# Patient Record
Sex: Female | Born: 1935
Health system: Southern US, Community
[De-identification: ages and names within clinical notes are randomized; demographics above are authoritative.]

## PROBLEM LIST (undated history)

## (undated) DIAGNOSIS — D649 Anemia, unspecified: Secondary | ICD-10-CM

## (undated) DIAGNOSIS — K449 Diaphragmatic hernia without obstruction or gangrene: Secondary | ICD-10-CM

## (undated) DIAGNOSIS — K922 Gastrointestinal hemorrhage, unspecified: Secondary | ICD-10-CM

## (undated) DIAGNOSIS — E785 Hyperlipidemia, unspecified: Secondary | ICD-10-CM

## (undated) DIAGNOSIS — N393 Stress incontinence (female) (male): Secondary | ICD-10-CM

## (undated) DIAGNOSIS — Z8719 Personal history of other diseases of the digestive system: Secondary | ICD-10-CM

## (undated) DIAGNOSIS — M545 Low back pain: Secondary | ICD-10-CM

## (undated) DIAGNOSIS — I480 Paroxysmal atrial fibrillation: Secondary | ICD-10-CM

## (undated) DIAGNOSIS — M6281 Muscle weakness (generalized): Secondary | ICD-10-CM

## (undated) DIAGNOSIS — G43109 Migraine with aura, not intractable, without status migrainosus: Secondary | ICD-10-CM

## (undated) DIAGNOSIS — F419 Anxiety disorder, unspecified: Secondary | ICD-10-CM

## (undated) DIAGNOSIS — I739 Peripheral vascular disease, unspecified: Secondary | ICD-10-CM

## (undated) DIAGNOSIS — I10 Essential (primary) hypertension: Secondary | ICD-10-CM

## (undated) DIAGNOSIS — I252 Old myocardial infarction: Secondary | ICD-10-CM

## (undated) DIAGNOSIS — I2510 Atherosclerotic heart disease of native coronary artery without angina pectoris: Secondary | ICD-10-CM

## (undated) DIAGNOSIS — H5702 Anisocoria: Secondary | ICD-10-CM

## (undated) DIAGNOSIS — I779 Disorder of arteries and arterioles, unspecified: Secondary | ICD-10-CM

## (undated) DIAGNOSIS — I639 Cerebral infarction, unspecified: Secondary | ICD-10-CM

## (undated) DIAGNOSIS — K219 Gastro-esophageal reflux disease without esophagitis: Secondary | ICD-10-CM

## (undated) DIAGNOSIS — K259 Gastric ulcer, unspecified as acute or chronic, without hemorrhage or perforation: Secondary | ICD-10-CM

## (undated) DIAGNOSIS — K589 Irritable bowel syndrome without diarrhea: Secondary | ICD-10-CM

## (undated) DIAGNOSIS — M1990 Unspecified osteoarthritis, unspecified site: Secondary | ICD-10-CM

## (undated) HISTORY — DX: Anisocoria: H57.02

## (undated) HISTORY — DX: Essential (primary) hypertension: I10

## (undated) HISTORY — DX: Personal history of other diseases of the digestive system: Z87.19

## (undated) HISTORY — DX: Gastric ulcer, unspecified as acute or chronic, without hemorrhage or perforation: K25.9

## (undated) HISTORY — DX: Irritable bowel syndrome without diarrhea: K58.9

## (undated) HISTORY — DX: Low back pain: M54.5

## (undated) HISTORY — DX: Atherosclerotic heart disease of native coronary artery without angina pectoris: I25.10

## (undated) HISTORY — DX: Migraine with aura, not intractable, without status migrainosus: G43.109

## (undated) HISTORY — DX: Diaphragmatic hernia without obstruction or gangrene: K44.9

## (undated) HISTORY — DX: Muscle weakness (generalized): M62.81

## (undated) HISTORY — DX: Hyperlipidemia, unspecified: E78.5

## (undated) HISTORY — DX: Paroxysmal atrial fibrillation: I48.0

## (undated) HISTORY — DX: Gastro-esophageal reflux disease without esophagitis: K21.9

## (undated) HISTORY — DX: Old myocardial infarction: I25.2

## (undated) HISTORY — PX: TONSILLECTOMY: SUR1361

## (undated) HISTORY — DX: Unspecified osteoarthritis, unspecified site: M19.90

---

## 1971-04-30 HISTORY — PX: ABDOMINAL HYSTERECTOMY: SHX81

## 1995-04-30 HISTORY — PX: CORONARY ANGIOPLASTY: SHX604

## 1995-04-30 HISTORY — PX: CORONARY ARTERY BYPASS GRAFT: SHX141

## 1997-04-29 HISTORY — PX: CATARACT EXTRACTION W/ INTRAOCULAR LENS  IMPLANT, BILATERAL: SHX1307

## 1997-08-23 ENCOUNTER — Other Ambulatory Visit: Admission: RE | Admit: 1997-08-23 | Discharge: 1997-08-23 | Payer: Self-pay | Admitting: Oral Surgery

## 1998-02-23 ENCOUNTER — Other Ambulatory Visit: Admission: RE | Admit: 1998-02-23 | Discharge: 1998-02-23 | Payer: Self-pay | Admitting: Obstetrics and Gynecology

## 1999-02-26 ENCOUNTER — Other Ambulatory Visit: Admission: RE | Admit: 1999-02-26 | Discharge: 1999-02-26 | Payer: Self-pay | Admitting: Obstetrics and Gynecology

## 1999-11-16 ENCOUNTER — Encounter: Payer: Self-pay | Admitting: Internal Medicine

## 1999-11-16 ENCOUNTER — Ambulatory Visit (HOSPITAL_COMMUNITY): Admission: RE | Admit: 1999-11-16 | Discharge: 1999-11-16 | Payer: Self-pay | Admitting: Internal Medicine

## 1999-12-07 ENCOUNTER — Ambulatory Visit (HOSPITAL_COMMUNITY): Admission: RE | Admit: 1999-12-07 | Discharge: 1999-12-07 | Payer: Self-pay | Admitting: Internal Medicine

## 1999-12-07 ENCOUNTER — Encounter: Payer: Self-pay | Admitting: Internal Medicine

## 2000-02-04 ENCOUNTER — Other Ambulatory Visit: Admission: RE | Admit: 2000-02-04 | Discharge: 2000-02-04 | Payer: Self-pay | Admitting: Obstetrics and Gynecology

## 2000-06-20 ENCOUNTER — Encounter: Payer: Self-pay | Admitting: Cardiology

## 2000-06-20 ENCOUNTER — Ambulatory Visit (HOSPITAL_COMMUNITY): Admission: RE | Admit: 2000-06-20 | Discharge: 2000-06-20 | Payer: Self-pay | Admitting: Cardiology

## 2001-10-21 ENCOUNTER — Other Ambulatory Visit: Admission: RE | Admit: 2001-10-21 | Discharge: 2001-10-21 | Payer: Self-pay | Admitting: Obstetrics and Gynecology

## 2003-01-08 ENCOUNTER — Ambulatory Visit (HOSPITAL_COMMUNITY): Admission: RE | Admit: 2003-01-08 | Discharge: 2003-01-08 | Payer: Self-pay | Admitting: Orthopedic Surgery

## 2003-01-08 ENCOUNTER — Encounter: Payer: Self-pay | Admitting: Orthopedic Surgery

## 2003-01-26 ENCOUNTER — Encounter: Payer: Self-pay | Admitting: Internal Medicine

## 2003-01-26 ENCOUNTER — Ambulatory Visit (HOSPITAL_COMMUNITY): Admission: RE | Admit: 2003-01-26 | Discharge: 2003-01-26 | Payer: Self-pay | Admitting: Internal Medicine

## 2003-05-02 LAB — HM COLONOSCOPY

## 2004-03-07 ENCOUNTER — Ambulatory Visit: Payer: Self-pay | Admitting: Internal Medicine

## 2004-06-13 ENCOUNTER — Ambulatory Visit: Payer: Self-pay | Admitting: Internal Medicine

## 2004-06-20 ENCOUNTER — Ambulatory Visit: Payer: Self-pay | Admitting: Internal Medicine

## 2004-06-25 ENCOUNTER — Ambulatory Visit: Payer: Self-pay | Admitting: Internal Medicine

## 2004-06-28 ENCOUNTER — Ambulatory Visit: Payer: Self-pay | Admitting: Internal Medicine

## 2004-10-08 ENCOUNTER — Ambulatory Visit: Payer: Self-pay | Admitting: Internal Medicine

## 2004-10-15 ENCOUNTER — Ambulatory Visit: Payer: Self-pay | Admitting: Cardiology

## 2004-10-16 ENCOUNTER — Ambulatory Visit: Payer: Self-pay | Admitting: Cardiology

## 2004-10-29 ENCOUNTER — Ambulatory Visit: Payer: Self-pay | Admitting: Internal Medicine

## 2004-11-01 ENCOUNTER — Ambulatory Visit: Payer: Self-pay | Admitting: Internal Medicine

## 2004-11-21 ENCOUNTER — Encounter: Payer: Self-pay | Admitting: Internal Medicine

## 2004-11-21 ENCOUNTER — Ambulatory Visit: Payer: Self-pay | Admitting: Internal Medicine

## 2004-11-27 ENCOUNTER — Ambulatory Visit: Payer: Self-pay | Admitting: Cardiology

## 2004-11-29 ENCOUNTER — Ambulatory Visit: Payer: Self-pay | Admitting: Cardiology

## 2005-03-04 ENCOUNTER — Ambulatory Visit: Payer: Self-pay | Admitting: Internal Medicine

## 2005-07-26 ENCOUNTER — Ambulatory Visit: Payer: Self-pay | Admitting: Cardiology

## 2005-07-31 ENCOUNTER — Ambulatory Visit: Payer: Self-pay | Admitting: Cardiology

## 2005-09-18 ENCOUNTER — Ambulatory Visit: Payer: Self-pay | Admitting: Internal Medicine

## 2005-09-26 ENCOUNTER — Ambulatory Visit: Payer: Self-pay | Admitting: Internal Medicine

## 2006-01-20 ENCOUNTER — Ambulatory Visit: Payer: Self-pay | Admitting: Internal Medicine

## 2006-03-28 ENCOUNTER — Ambulatory Visit: Payer: Self-pay | Admitting: Internal Medicine

## 2006-07-04 ENCOUNTER — Ambulatory Visit: Payer: Self-pay | Admitting: Internal Medicine

## 2006-08-29 ENCOUNTER — Ambulatory Visit: Payer: Self-pay | Admitting: Internal Medicine

## 2006-08-29 LAB — CONVERTED CEMR LAB
ALT: 17 units/L (ref 0–40)
AST: 18 units/L (ref 0–37)
Alkaline Phosphatase: 68 units/L (ref 39–117)
Basophils Relative: 0.8 % (ref 0.0–1.0)
Bilirubin, Direct: 0.1 mg/dL (ref 0.0–0.3)
Calcium: 9.4 mg/dL (ref 8.4–10.5)
Chloride: 105 meq/L (ref 96–112)
Cholesterol: 191 mg/dL (ref 0–200)
Eosinophils Relative: 3.3 % (ref 0.0–5.0)
GFR calc Af Amer: 125 mL/min
GFR calc non Af Amer: 103 mL/min
HCT: 39.8 % (ref 36.0–46.0)
Hemoglobin: 13.7 g/dL (ref 12.0–15.0)
MCHC: 34.4 g/dL (ref 30.0–36.0)
Monocytes Relative: 9.1 % (ref 3.0–11.0)
Potassium: 4.1 meq/L (ref 3.5–5.1)
RBC: 4.43 M/uL (ref 3.87–5.11)
Sodium: 143 meq/L (ref 135–145)
TSH: 2.12 microintl units/mL (ref 0.35–5.50)
Total Bilirubin: 0.7 mg/dL (ref 0.3–1.2)
Total CHOL/HDL Ratio: 3.9
Triglycerides: 121 mg/dL (ref 0–149)
VLDL: 24 mg/dL (ref 0–40)

## 2006-09-05 ENCOUNTER — Ambulatory Visit: Payer: Self-pay | Admitting: Internal Medicine

## 2006-11-25 ENCOUNTER — Encounter: Payer: Self-pay | Admitting: Internal Medicine

## 2006-11-25 DIAGNOSIS — K219 Gastro-esophageal reflux disease without esophagitis: Secondary | ICD-10-CM

## 2006-11-25 DIAGNOSIS — E785 Hyperlipidemia, unspecified: Secondary | ICD-10-CM

## 2006-11-25 DIAGNOSIS — I480 Paroxysmal atrial fibrillation: Secondary | ICD-10-CM

## 2006-11-25 DIAGNOSIS — Z8719 Personal history of other diseases of the digestive system: Secondary | ICD-10-CM | POA: Insufficient documentation

## 2006-11-25 DIAGNOSIS — I2510 Atherosclerotic heart disease of native coronary artery without angina pectoris: Secondary | ICD-10-CM

## 2006-11-25 HISTORY — DX: Paroxysmal atrial fibrillation: I48.0

## 2006-11-25 HISTORY — DX: Gastro-esophageal reflux disease without esophagitis: K21.9

## 2007-02-05 ENCOUNTER — Ambulatory Visit: Payer: Self-pay | Admitting: Internal Medicine

## 2007-02-05 DIAGNOSIS — J209 Acute bronchitis, unspecified: Secondary | ICD-10-CM

## 2007-02-26 ENCOUNTER — Emergency Department (HOSPITAL_COMMUNITY): Admission: EM | Admit: 2007-02-26 | Discharge: 2007-02-26 | Payer: Self-pay | Admitting: Emergency Medicine

## 2007-03-02 ENCOUNTER — Telehealth: Payer: Self-pay | Admitting: Internal Medicine

## 2007-03-05 ENCOUNTER — Ambulatory Visit: Payer: Self-pay | Admitting: Internal Medicine

## 2007-03-10 ENCOUNTER — Ambulatory Visit: Payer: Self-pay | Admitting: Internal Medicine

## 2007-04-13 ENCOUNTER — Ambulatory Visit: Payer: Self-pay | Admitting: Internal Medicine

## 2007-04-13 DIAGNOSIS — G459 Transient cerebral ischemic attack, unspecified: Secondary | ICD-10-CM | POA: Insufficient documentation

## 2007-04-29 ENCOUNTER — Ambulatory Visit: Payer: Self-pay

## 2007-04-29 ENCOUNTER — Encounter: Payer: Self-pay | Admitting: Internal Medicine

## 2007-05-08 ENCOUNTER — Ambulatory Visit: Payer: Self-pay | Admitting: Internal Medicine

## 2007-05-08 LAB — CONVERTED CEMR LAB
ALT: 14 units/L (ref 0–35)
BUN: 10 mg/dL (ref 6–23)
Basophils Absolute: 0 10*3/uL (ref 0.0–0.1)
CO2: 32 meq/L (ref 19–32)
Calcium: 10.4 mg/dL (ref 8.4–10.5)
Chloride: 102 meq/L (ref 96–112)
Creatinine, Ser: 0.7 mg/dL (ref 0.4–1.2)
Eosinophils Absolute: 0.2 10*3/uL (ref 0.0–0.6)
Eosinophils Relative: 2.9 % (ref 0.0–5.0)
GFR calc non Af Amer: 86 mL/min
Glucose, Bld: 87 mg/dL (ref 70–99)
HDL: 50.6 mg/dL (ref >39.0)
LDL Cholesterol: 107 mg/dL — ABNORMAL HIGH (ref 0–99)
Monocytes Absolute: 0.7 10*3/uL (ref 0.2–0.7)
Neutro Abs: 5.6 10*3/uL (ref 1.4–7.7)
Neutrophils Relative %: 67.9 % (ref 43.0–77.0)
RBC: 4.4 M/uL (ref 3.87–5.11)
TSH: 2.58 microintl units/mL (ref 0.35–5.50)
Total CHOL/HDL Ratio: 3.7
Total Protein: 7 g/dL (ref 6.0–8.3)
Triglycerides: 157 mg/dL — ABNORMAL HIGH (ref 0–149)

## 2007-05-12 ENCOUNTER — Telehealth (INDEPENDENT_AMBULATORY_CARE_PROVIDER_SITE_OTHER): Payer: Self-pay | Admitting: *Deleted

## 2007-05-15 ENCOUNTER — Telehealth: Payer: Self-pay | Admitting: Internal Medicine

## 2007-05-22 ENCOUNTER — Telehealth: Payer: Self-pay | Admitting: Internal Medicine

## 2007-07-03 ENCOUNTER — Telehealth (INDEPENDENT_AMBULATORY_CARE_PROVIDER_SITE_OTHER): Payer: Self-pay | Admitting: *Deleted

## 2007-07-09 ENCOUNTER — Encounter: Payer: Self-pay | Admitting: Internal Medicine

## 2007-08-13 ENCOUNTER — Ambulatory Visit: Payer: Self-pay | Admitting: Internal Medicine

## 2007-08-13 DIAGNOSIS — M1990 Unspecified osteoarthritis, unspecified site: Secondary | ICD-10-CM

## 2007-08-13 DIAGNOSIS — G43109 Migraine with aura, not intractable, without status migrainosus: Secondary | ICD-10-CM

## 2007-08-13 DIAGNOSIS — G9340 Encephalopathy, unspecified: Secondary | ICD-10-CM | POA: Insufficient documentation

## 2007-08-13 HISTORY — DX: Unspecified osteoarthritis, unspecified site: M19.90

## 2007-08-13 HISTORY — DX: Migraine with aura, not intractable, without status migrainosus: G43.109

## 2007-11-12 ENCOUNTER — Ambulatory Visit: Payer: Self-pay | Admitting: Internal Medicine

## 2007-11-13 LAB — CONVERTED CEMR LAB
Cholesterol: 197 mg/dL (ref 0–200)
Total CHOL/HDL Ratio: 4.2
VLDL: 37 mg/dL (ref 0–40)

## 2007-11-18 ENCOUNTER — Telehealth: Payer: Self-pay | Admitting: Internal Medicine

## 2007-11-26 ENCOUNTER — Telehealth: Payer: Self-pay | Admitting: Internal Medicine

## 2007-12-15 ENCOUNTER — Encounter: Payer: Self-pay | Admitting: Internal Medicine

## 2008-01-29 ENCOUNTER — Telehealth: Payer: Self-pay | Admitting: Internal Medicine

## 2008-02-04 ENCOUNTER — Telehealth: Payer: Self-pay | Admitting: Internal Medicine

## 2008-02-11 ENCOUNTER — Ambulatory Visit: Payer: Self-pay | Admitting: Internal Medicine

## 2008-03-17 ENCOUNTER — Ambulatory Visit: Payer: Self-pay | Admitting: Internal Medicine

## 2008-07-21 ENCOUNTER — Ambulatory Visit: Payer: Self-pay | Admitting: Internal Medicine

## 2008-07-21 DIAGNOSIS — R531 Weakness: Secondary | ICD-10-CM

## 2008-07-22 LAB — CONVERTED CEMR LAB
Albumin: 3.6 g/dL (ref 3.5–5.2)
BUN: 12 mg/dL (ref 6–23)
Basophils Absolute: 0.1 10*3/uL (ref 0.0–0.1)
Basophils Relative: 1.2 % (ref 0.0–3.0)
CO2: 28 meq/L (ref 19–32)
Calcium: 9.4 mg/dL (ref 8.4–10.5)
Chloride: 103 meq/L (ref 96–112)
Cholesterol: 169 mg/dL (ref 0–200)
Creatinine, Ser: 0.5 mg/dL (ref 0.4–1.2)
Eosinophils Absolute: 0.2 10*3/uL (ref 0.0–0.7)
Eosinophils Relative: 4.1 % (ref 0.0–5.0)
GFR calc non Af Amer: 126.26 mL/min (ref >60)
Glucose, Bld: 97 mg/dL (ref 70–99)
HCT: 38.2 % (ref 36.0–46.0)
Lymphocytes Relative: 26.6 % (ref 12.0–46.0)
Lymphs Abs: 1.4 10*3/uL (ref 0.7–4.0)
MCV: 92 fL (ref 78.0–100.0)
Neutro Abs: 3 10*3/uL (ref 1.4–7.7)
RDW: 12.2 % (ref 11.5–14.6)
Total Protein: 6.6 g/dL (ref 6.0–8.3)

## 2008-09-28 ENCOUNTER — Encounter: Payer: Self-pay | Admitting: Internal Medicine

## 2008-09-29 ENCOUNTER — Encounter: Payer: Self-pay | Admitting: Internal Medicine

## 2008-10-25 ENCOUNTER — Encounter: Payer: Self-pay | Admitting: Cardiology

## 2008-11-04 ENCOUNTER — Ambulatory Visit: Payer: Self-pay | Admitting: Internal Medicine

## 2008-11-04 DIAGNOSIS — J069 Acute upper respiratory infection, unspecified: Secondary | ICD-10-CM | POA: Insufficient documentation

## 2008-12-07 DIAGNOSIS — K589 Irritable bowel syndrome without diarrhea: Secondary | ICD-10-CM

## 2008-12-07 HISTORY — DX: Irritable bowel syndrome, unspecified: K58.9

## 2008-12-08 ENCOUNTER — Ambulatory Visit: Payer: Self-pay | Admitting: Cardiology

## 2008-12-08 DIAGNOSIS — R002 Palpitations: Secondary | ICD-10-CM

## 2008-12-15 ENCOUNTER — Ambulatory Visit: Payer: Self-pay | Admitting: Cardiology

## 2008-12-21 ENCOUNTER — Encounter: Payer: Self-pay | Admitting: Internal Medicine

## 2009-01-24 ENCOUNTER — Telehealth: Payer: Self-pay | Admitting: Internal Medicine

## 2009-01-30 ENCOUNTER — Telehealth: Payer: Self-pay | Admitting: Cardiology

## 2009-02-16 ENCOUNTER — Ambulatory Visit: Payer: Self-pay | Admitting: Cardiology

## 2009-05-02 ENCOUNTER — Telehealth (INDEPENDENT_AMBULATORY_CARE_PROVIDER_SITE_OTHER): Payer: Self-pay | Admitting: *Deleted

## 2009-05-03 ENCOUNTER — Encounter: Payer: Self-pay | Admitting: Internal Medicine

## 2009-05-04 ENCOUNTER — Encounter: Payer: Self-pay | Admitting: Cardiovascular Disease

## 2009-05-04 ENCOUNTER — Ambulatory Visit: Payer: Self-pay

## 2009-05-04 ENCOUNTER — Telehealth: Payer: Self-pay | Admitting: Internal Medicine

## 2009-06-05 ENCOUNTER — Encounter: Payer: Self-pay | Admitting: Internal Medicine

## 2009-06-08 ENCOUNTER — Telehealth: Payer: Self-pay | Admitting: Internal Medicine

## 2009-07-18 ENCOUNTER — Ambulatory Visit: Payer: Self-pay | Admitting: Internal Medicine

## 2009-07-18 LAB — CONVERTED CEMR LAB: Total CHOL/HDL Ratio: 3

## 2009-07-25 ENCOUNTER — Telehealth: Payer: Self-pay | Admitting: Internal Medicine

## 2009-08-03 ENCOUNTER — Telehealth: Payer: Self-pay | Admitting: Internal Medicine

## 2009-09-12 ENCOUNTER — Telehealth: Payer: Self-pay | Admitting: Internal Medicine

## 2009-09-19 ENCOUNTER — Ambulatory Visit: Payer: Self-pay | Admitting: Internal Medicine

## 2009-09-19 DIAGNOSIS — R634 Abnormal weight loss: Secondary | ICD-10-CM

## 2009-09-19 LAB — CONVERTED CEMR LAB
ALT: 14 units/L (ref 0–35)
AST: 21 units/L (ref 0–37)
BUN: 16 mg/dL (ref 6–23)
Basophils Absolute: 0 10*3/uL (ref 0.0–0.1)
Basophils Relative: 0.6 % (ref 0.0–3.0)
Bilirubin, Direct: 0.1 mg/dL (ref 0.0–0.3)
Calcium: 9.3 mg/dL (ref 8.4–10.5)
Chloride: 103 meq/L (ref 96–112)
Eosinophils Relative: 3.3 % (ref 0.0–5.0)
GFR calc non Af Amer: 112.78 mL/min (ref >60)
HCT: 38.8 % (ref 36.0–46.0)
Lymphocytes Relative: 27.6 % (ref 12.0–46.0)
Lymphs Abs: 1.8 10*3/uL (ref 0.7–4.0)
MCHC: 34 g/dL (ref 30.0–36.0)
Monocytes Absolute: 0.7 10*3/uL (ref 0.1–1.0)
Neutrophils Relative %: 57.3 % (ref 43.0–77.0)
Potassium: 4.1 meq/L (ref 3.5–5.1)
RBC: 4.21 M/uL (ref 3.87–5.11)
Sodium: 139 meq/L (ref 135–145)
Total Protein: 6.6 g/dL (ref 6.0–8.3)
WBC: 6.5 10*3/uL (ref 4.5–10.5)

## 2009-09-20 ENCOUNTER — Encounter: Payer: Self-pay | Admitting: Internal Medicine

## 2009-09-20 ENCOUNTER — Telehealth: Payer: Self-pay | Admitting: Internal Medicine

## 2009-09-21 ENCOUNTER — Encounter: Admission: RE | Admit: 2009-09-21 | Discharge: 2009-09-21 | Payer: Self-pay | Admitting: Internal Medicine

## 2009-09-26 ENCOUNTER — Telehealth: Payer: Self-pay | Admitting: Internal Medicine

## 2009-09-27 ENCOUNTER — Ambulatory Visit: Payer: Self-pay | Admitting: Internal Medicine

## 2009-09-27 ENCOUNTER — Ambulatory Visit: Payer: Self-pay | Admitting: Family Medicine

## 2009-09-27 DIAGNOSIS — M6281 Muscle weakness (generalized): Secondary | ICD-10-CM | POA: Insufficient documentation

## 2009-09-27 DIAGNOSIS — M545 Low back pain, unspecified: Secondary | ICD-10-CM

## 2009-09-27 HISTORY — DX: Low back pain, unspecified: M54.50

## 2009-09-27 HISTORY — DX: Muscle weakness (generalized): M62.81

## 2009-09-27 LAB — CONVERTED CEMR LAB: OCCULT 3: NEGATIVE

## 2009-09-28 ENCOUNTER — Inpatient Hospital Stay (HOSPITAL_COMMUNITY): Admission: EM | Admit: 2009-09-28 | Discharge: 2009-09-29 | Payer: Self-pay | Admitting: Emergency Medicine

## 2009-09-28 ENCOUNTER — Telehealth: Payer: Self-pay | Admitting: Cardiology

## 2009-09-29 ENCOUNTER — Ambulatory Visit: Payer: Self-pay | Admitting: Cardiovascular Disease

## 2009-10-02 ENCOUNTER — Telehealth: Payer: Self-pay | Admitting: Internal Medicine

## 2009-10-09 ENCOUNTER — Telehealth: Payer: Self-pay | Admitting: Internal Medicine

## 2009-10-09 ENCOUNTER — Inpatient Hospital Stay (HOSPITAL_COMMUNITY): Admission: EM | Admit: 2009-10-09 | Discharge: 2009-10-11 | Payer: Self-pay | Admitting: Emergency Medicine

## 2009-10-09 ENCOUNTER — Ambulatory Visit: Payer: Self-pay | Admitting: Internal Medicine

## 2009-10-10 ENCOUNTER — Encounter: Payer: Self-pay | Admitting: Internal Medicine

## 2009-10-10 ENCOUNTER — Encounter (INDEPENDENT_AMBULATORY_CARE_PROVIDER_SITE_OTHER): Payer: Self-pay | Admitting: Internal Medicine

## 2009-10-18 ENCOUNTER — Ambulatory Visit: Payer: Self-pay | Admitting: Internal Medicine

## 2009-10-20 ENCOUNTER — Telehealth: Payer: Self-pay | Admitting: Internal Medicine

## 2009-10-22 ENCOUNTER — Encounter: Payer: Self-pay | Admitting: Internal Medicine

## 2009-10-23 ENCOUNTER — Telehealth: Payer: Self-pay | Admitting: Internal Medicine

## 2009-10-23 ENCOUNTER — Ambulatory Visit: Payer: Self-pay | Admitting: Internal Medicine

## 2009-10-23 DIAGNOSIS — K250 Acute gastric ulcer with hemorrhage: Secondary | ICD-10-CM | POA: Insufficient documentation

## 2009-10-23 DIAGNOSIS — D62 Acute posthemorrhagic anemia: Secondary | ICD-10-CM | POA: Insufficient documentation

## 2009-10-26 ENCOUNTER — Encounter: Payer: Self-pay | Admitting: Internal Medicine

## 2009-10-26 LAB — CONVERTED CEMR LAB
Eosinophils Relative: 4.9 % (ref 0.0–5.0)
Ferritin: 19.8 ng/mL (ref 10.0–291.0)
Hemoglobin: 10.9 g/dL — ABNORMAL LOW (ref 12.0–15.0)
Lymphocytes Relative: 25.2 % (ref 12.0–46.0)
Lymphs Abs: 1.4 10*3/uL (ref 0.7–4.0)
MCHC: 34.7 g/dL (ref 30.0–36.0)
Monocytes Absolute: 0.6 10*3/uL (ref 0.1–1.0)
Neutro Abs: 3.3 10*3/uL (ref 1.4–7.7)
RDW: 14.4 % (ref 11.5–14.6)
WBC: 5.6 10*3/uL (ref 4.5–10.5)

## 2009-10-27 ENCOUNTER — Telehealth: Payer: Self-pay | Admitting: Internal Medicine

## 2009-11-06 ENCOUNTER — Telehealth: Payer: Self-pay | Admitting: Internal Medicine

## 2009-11-09 ENCOUNTER — Encounter: Payer: Self-pay | Admitting: Internal Medicine

## 2009-11-13 ENCOUNTER — Telehealth: Payer: Self-pay | Admitting: Internal Medicine

## 2009-11-16 ENCOUNTER — Telehealth: Payer: Self-pay | Admitting: Internal Medicine

## 2009-11-22 ENCOUNTER — Ambulatory Visit: Payer: Self-pay | Admitting: Internal Medicine

## 2009-11-22 LAB — CONVERTED CEMR LAB
Eosinophils Absolute: 0.3 10*3/uL (ref 0.0–0.7)
Eosinophils Relative: 4.8 % (ref 0.0–5.0)
Lymphocytes Relative: 22 % (ref 12.0–46.0)
Lymphs Abs: 1.3 10*3/uL (ref 0.7–4.0)
MCHC: 34.5 g/dL (ref 30.0–36.0)
Monocytes Absolute: 0.6 10*3/uL (ref 0.1–1.0)
Monocytes Relative: 9.9 % (ref 3.0–12.0)
Neutro Abs: 3.5 10*3/uL (ref 1.4–7.7)
Neutrophils Relative %: 62.5 % (ref 43.0–77.0)

## 2009-11-26 ENCOUNTER — Emergency Department (HOSPITAL_COMMUNITY): Admission: EM | Admit: 2009-11-26 | Discharge: 2009-11-26 | Payer: Self-pay | Admitting: Family Medicine

## 2009-11-27 ENCOUNTER — Telehealth: Payer: Self-pay | Admitting: Internal Medicine

## 2009-11-29 ENCOUNTER — Telehealth: Payer: Self-pay | Admitting: Internal Medicine

## 2009-12-01 ENCOUNTER — Telehealth: Payer: Self-pay | Admitting: Internal Medicine

## 2009-12-04 ENCOUNTER — Telehealth: Payer: Self-pay | Admitting: Cardiology

## 2009-12-13 ENCOUNTER — Encounter: Payer: Self-pay | Admitting: Internal Medicine

## 2010-01-04 ENCOUNTER — Ambulatory Visit: Payer: Self-pay | Admitting: Internal Medicine

## 2010-01-04 DIAGNOSIS — Z8679 Personal history of other diseases of the circulatory system: Secondary | ICD-10-CM | POA: Insufficient documentation

## 2010-01-24 ENCOUNTER — Ambulatory Visit: Payer: Self-pay | Admitting: Internal Medicine

## 2010-01-24 LAB — CONVERTED CEMR LAB
Basophils Relative: 0.5 % (ref 0.0–3.0)
Cholesterol: 189 mg/dL (ref 0–200)
Eosinophils Absolute: 0.2 10*3/uL (ref 0.0–0.7)
MCHC: 33.6 g/dL (ref 30.0–36.0)
MCV: 89.3 fL (ref 78.0–100.0)
Platelets: 176 10*3/uL (ref 150.0–400.0)
RBC: 4.12 M/uL (ref 3.87–5.11)
RDW: 13.9 % (ref 11.5–14.6)

## 2010-01-26 ENCOUNTER — Telehealth: Payer: Self-pay

## 2010-02-26 ENCOUNTER — Telehealth: Payer: Self-pay | Admitting: Internal Medicine

## 2010-03-14 ENCOUNTER — Telehealth: Payer: Self-pay | Admitting: Internal Medicine

## 2010-05-29 NOTE — Progress Notes (Signed)
Summary: rx  Phone Note Refill Request   Refills Requested: Medication #1:  LORAZEPAM 0.5 MG  TABS 1 two times a day as needed   Last Refilled: 03/20/2009 [facility] ph-----[phone]      fax----[phone]  Initial call taken by: Warnell Forester,  May 04, 2009 10:19 AM  Follow-up for Phone Call        Rx faxed to pharmacy Follow-up by: Raechel Ache, RN,  May 04, 2009 10:24 AM    Prescriptions: LORAZEPAM 0.5 MG  TABS (LORAZEPAM) 1 two times a day as needed  #100 x 1   Entered by:   Raechel Ache, RN   Authorized by:   Gordy Savers  MD   Signed by:   Raechel Ache, RN on 05/04/2009   Method used:   Printed then faxed to ...       [facility] (retail)       [address]       [place], [place]  [address]       Ph: [phone] or [phone]       Fax: [phone]   RxID:   [id number]

## 2010-05-29 NOTE — Progress Notes (Signed)
  Phone Note Call from Patient   Caller: Patient Call For: Gordy Savers  MD Summary of Call: In doughnut hole, and needs samples of Wellchol if possible??? [phone] Initial call taken by: [facility] CMA AAMA,  March 14, 2010 11:03 AM  Follow-up for Phone Call        ok Follow-up by: Gordy Savers  MD,  March 14, 2010 12:26 PM  Additional Follow-up for Phone Call Additional follow up Details #1::        Pt. notified. Additional Follow-up by: Lynann Beaver CMA AAMA,  March 14, 2010 1:16 PM

## 2010-05-29 NOTE — Progress Notes (Signed)
Summary: REQ FOR RETURN CALL  Phone Note Call from Patient   Caller: Patient   [phone] Summary of Call: Pt called in to adv that she needs a refill on med: Prednisone... Pt adv Dr Lanny Hurst wrote her a Rx for Prednisone to help with arthritis but this was  before pt had her stroke... Pt adv that she is still having problems and wants to know if Dr Kirtland Bouchard will prescribe her the med because she is still having some problems and the prednisone seemed to help before?Marland Kitchen... Pt was offered OV but Pt adv that she is not able to come in this week.... Pt would like a return call to discuss this medication if possible... pt can be reached at [phone].  Initial call taken by: Debbra Riding,  October 02, 2009 11:39 AM    New/Updated Medications: PREDNISONE 10 MG TABS (PREDNISONE) one daily Prescriptions: PREDNISONE 10 MG TABS (PREDNISONE) one daily  #30 x 0   Entered and Authorized by:   Gordy Savers  MD   Signed by:   Gordy Savers  MD on 10/02/2009   Method used:   Electronically to        [facility] (retail)       [address]       [place], [place]  [id number]       Ph: [phone] or [phone]       Fax: [phone]   RxID:   [id number]  prednisone 5 mg  #30 [facility]

## 2010-05-29 NOTE — Progress Notes (Signed)
Summary: lab results  Phone Note Call from Patient Call back at Home Phone [phone]   Caller: Patient Call For: Marissa Savers  MD Summary of Call: Please call pt with lab results. Initial call taken by: Lynann Beaver CMA,  January 26, 2010 3:19 PM  Follow-up for Phone Call        all normal ; cholesterol 189  Follow-up by: Marissa Savers  MD,  January 26, 2010 4:07 PM  Additional Follow-up for Phone Call Additional follow up Details #1::        called and discussed labs KIK Additional Follow-up by: Duard Brady LPN,  January 26, 2010 4:59 PM

## 2010-05-29 NOTE — Progress Notes (Signed)
Summary: take ASA?  Phone Note Call from Patient   Caller: Patient Call For: Marissa Savers  MD Summary of Call: Pt sprained wrist yesterday and was seen at [facility].  Was told to take 2 ASA daily, and pt wants Dr. Charm Rings permission to do this? [phone] Initial call taken by: Lynann Beaver CMA,  November 27, 2009 11:15 AM  Follow-up for Phone Call        ok Follow-up by: Marissa Savers  MD,  November 27, 2009 12:37 PM  Additional Follow-up for Phone Call Additional follow up Details #1::        Pt. advised. Additional Follow-up by: Lynann Beaver CMA,  November 27, 2009 12:55 PM

## 2010-05-29 NOTE — Assessment & Plan Note (Signed)
Summary: consult re: iron lvls/pt coming fasting/cjr   Vital Signs:  Patient profile:   77 year old female Weight:      150 pounds Temp:     98.0 degrees F oral BP sitting:   110 / 60  (right arm) Cuff size:   regular  Vitals Entered By: Duard Brady LPN (November 22, 2009 8:21 AM) CC: f/u on iron counts Is Patient Diabetic? No   Primary Care Provider:  Gordy Savers, MD  CC:  f/u on iron counts.  History of Present Illness: 77 year old patient who is in today for follow-up.  She  was hospitalized in early June for a right thalamic stroke.  She has a very mild left residual weakness.  She was rehospitalized later in the month for GI bleeding related to a antral gastric ulcer.  She is doing quite well and has been seen by GI and follow-up.  She has multiple questions about these two hospital admissions.  In hospital records were reviewed at length.  She has a history of IBS, osteoarthritis, and coronary artery disease.  She has treated hypertension, and dyslipidemia.she remains on iron b.i.d.  Allergies: 1)  ! Penicillin G Potassium (Penicillin G Potassium) 2)  ! * Statins 3)  ! Oxycodone Hcl (Oxycodone Hcl) 4)  ! Advil 5)  ! Prednisone 6)  Hydrocodone  Past History:  Past Medical History: Current Problems:  TRANSIENT ISCHEMIC ATTACK (ICD-435.9) HYPERLIPIDEMIA (ICD-272.4) HYPERTENSION (ICD-401.9) CORONARY ARTERY DISEASE (ICD-414.00) URI (ICD-465.9) FATIGUE (ICD-780.79) MIGRAINE WITH AURA (ICD-346.00) OSTEOARTHRITIS (ICD-715.90) ENCOUNTER FOR REMOVAL OF SUTURES (ICD-V58.32) BRONCHITIS, VIRAL (ICD-466.0) DIVERTICULITIS, HX OF (ICD-V12.79) GERD (ICD-530.81) IBS (ICD-564.1) gastric ulcer  June 2011 hiatal hernia status post right thalamic stroke June 2011 cerebrovascular disease with extensive small vessel disease changes  Review of Systems  The patient denies anorexia, fever, weight loss, weight gain, vision loss, decreased hearing, hoarseness, chest pain,  syncope, dyspnea on exertion, peripheral edema, prolonged cough, headaches, hemoptysis, abdominal pain, melena, hematochezia, severe indigestion/heartburn, hematuria, incontinence, genital sores, muscle weakness, suspicious skin lesions, transient blindness, difficulty walking, depression, unusual weight change, abnormal bleeding, enlarged lymph nodes, angioedema, and breast masses.    Physical Exam  General:  Well-developed,well-nourished,in no acute distress; alert,appropriate and cooperative throughout examination   blood pressure 110/64 Head:  Normocephalic and atraumatic without obvious abnormalities. No apparent alopecia or balding. Eyes:  No corneal or conjunctival inflammation noted. EOMI. Perrla. Funduscopic exam benign, without hemorrhages, exudates or papilledema. Vision grossly normal. Mouth:  Oral mucosa and oropharynx without lesions or exudates.  Teeth in good repair. Neck:  No deformities, masses, or tenderness noted. Lungs:  Normal respiratory effort, chest expands symmetrically. Lungs are clear to auscultation, no crackles or wheezes. Heart:  Normal rate and regular rhythm. S1 and S2 normal without gallop, murmur, click, rub or other extra sounds. Abdomen:  Bowel sounds positive,abdomen soft and non-tender without masses, organomegaly or hernias noted. Msk:  No deformity or scoliosis noted of thoracic or lumbar spine.   Pulses:  R and L carotid,radial,femoral,dorsalis pedis and posterior tibial pulses are full and equal bilaterally Neurologic:  No cranial nerve deficits noted. Station and gait are normal. Plantar reflexes are down-going bilaterally. DTRs are symmetrical throughout. Sensory, motor and coordinative functions appear intact.   Impression & Recommendations:  Problem # 1:  ACUTE POSTHEMORRHAGIC ANEMIA (ICD-285.1)  Her updated medication list for this problem includes:    Iron Supplement 325 (65 Fe) Mg Tabs (Ferrous sulfate) .Marland Kitchen... 1 by mouth two times a  day  Her updated medication list for this problem includes:    Iron Supplement 325 (65 Fe) Mg Tabs (Ferrous sulfate) .Marland Kitchen... 1 by mouth two times a day  Problem # 2:  ACUT GASTR ULCER W/HEMORR W/O MENTION OBST (ICD-531.00)  The following medications were removed from the medication list:    Nexium 40 Mg Cpdr (Esomeprazole magnesium) .Marland Kitchen... Take one by mouth once daily Her updated medication list for this problem includes:    Omeprazole 20 Mg Cpdr (Omeprazole) ..... Qd    The following medications were removed from the medication list:    Nexium 40 Mg Cpdr (Esomeprazole magnesium) .Marland Kitchen... Take one by mouth once daily Her updated medication list for this problem includes:    Omeprazole 20 Mg Cpdr (Omeprazole) ..... Qd  Problem # 3:  HYPERTENSION (ICD-401.9)  Her updated medication list for this problem includes:    Toprol Xl 50 Mg Tb24 (Metoprolol succinate) ..... Once daily  Her updated medication list for this problem includes:    Toprol Xl 50 Mg Tb24 (Metoprolol succinate) ..... Once daily  Problem # 4:  CORONARY ARTERY DISEASE (ICD-414.00)  Her updated medication list for this problem includes:    Toprol Xl 50 Mg Tb24 (Metoprolol succinate) ..... Once daily    Aspirin Buffered 325 Mg Tabs (Aspirin buff(mgcarb-alaminoac)) ..... One daily    Plavix 75 Mg Tabs (Clopidogrel bisulfate) .Marland Kitchen... Take one by mouth once daily  Her updated medication list for this problem includes:    Toprol Xl 50 Mg Tb24 (Metoprolol succinate) ..... Once daily    Aspirin Buffered 325 Mg Tabs (Aspirin buff(mgcarb-alaminoac)) ..... One daily    Plavix 75 Mg Tabs (Clopidogrel bisulfate) .Marland Kitchen... Take one by mouth once daily  Complete Medication List: 1)  Toprol Xl 50 Mg Tb24 (Metoprolol succinate) .... Once daily 2)  Welchol 625 Mg Tabs (Colesevelam hcl) .... 6 once daily 3)  Lorazepam 0.5 Mg Tabs (Lorazepam) .Marland Kitchen.. 1 two times a day as needed 4)  Niacin Cr 500 Mg Cpcr (Niacin) .... One daily 5)  Aspirin  Buffered 325 Mg Tabs (Aspirin buff(mgcarb-alaminoac)) .... One daily 6)  Flax Seed Oil 1000 Mg Caps (Flaxseed (linseed)) .... Take 1 capsule by mouth once a day 7)  Fish Oil 1000 Mg Caps (Omega-3 fatty acids) .... Take 1 capsule by mouth once a day 8)  Centrum Silver Tabs (Multiple vitamins-minerals) .... Take 1 tablet by mouth once a day 9)  Calcium Carbonate-vitamin D 600-400 Mg-unit Tabs (Calcium carbonate-vitamin d) .... Take 1 tablet by mouth once a day 10)  Vitamin C 500 Mg Tabs (Ascorbic acid) .... Take 1 tablet by mouth once a day 11)  Plavix 75 Mg Tabs (Clopidogrel bisulfate) .... Take one by mouth once daily 12)  Lipo-gel 100-30 Mg-unit Caps (Alpha lipoic acid-vitamin e) .... Take one by mouth once daily 13)  Tylenol Extra Strength 500 Mg Tabs (Acetaminophen) .Marland Kitchen.. 1-2 by mouth as needed pain 14)  Iron Supplement 325 (65 Fe) Mg Tabs (Ferrous sulfate) .Marland Kitchen.. 1 by mouth two times a day 15)  Omeprazole 20 Mg Cpdr (Omeprazole) .... Qd  Other Orders: Venipuncture (21308) TLB-CBC Platelet - w/Differential (85025-CBCD)  Patient Instructions: 1)  Please schedule a follow-up appointment in 2 months. 2)  Limit your Sodium (Salt) to less than 2 grams a day(slightly less than 1/2 a teaspoon) to prevent fluid retention, swelling, or worsening of symptoms. 3)  It is important that you exercise regularly at least 20 minutes 5 times a week. If you develop chest pain,  have severe difficulty breathing, or feel very tired , stop exercising immediately and seek medical attention.

## 2010-05-29 NOTE — Progress Notes (Signed)
Summary: needs discharge order for homehealth  Phone Note From Other Clinic   Caller: ? Zena Amos with homehealth Summary of Call: speech pathologist , went to see Marissa Bowen, pt refusing services - per pt no problems with swallowing,speech /language or cognition. Would liek order to discharge service.  can call [phone] Initial call taken by: Duard Brady LPN,  October 23, 2009 12:04 PM  Follow-up for Phone Call        OK to d/c Follow-up by: Gordy Savers  MD,  October 23, 2009 5:39 PM  Additional Follow-up for Phone Call Additional follow up Details #1::        attempt to call - ans mach - left msg to [facility] speech [facility]. KIK

## 2010-05-29 NOTE — Progress Notes (Signed)
Summary: RX refill Welchol   Phone Note Refill Request Message from:  Pharmacy on November 06, 2009 4:44 PM  Refills Requested: Medication #1:  WELCHOL 625 MG  TABS 6 once daily   Dosage confirmed as above?Dosage Confirmed Initial call taken by: Kathrynn Speed CMA,  November 06, 2009 4:44 PM    Prescriptions: WELCHOL 625 MG  TABS (COLESEVELAM HCL) 6 once daily  #360 x 4   Entered by:   Kathrynn Speed CMA   Authorized by:   Gordy Savers  MD   Signed by:   Kathrynn Speed CMA on 11/06/2009   Method used:   Electronically to        [facility] (retail)       [address]       [place], [place]  [address]       Ph: [phone] or [phone]       Fax: [phone]   RxID:   [id number]

## 2010-05-29 NOTE — Miscellaneous (Signed)
Summary: Physician's Orders/[facility]  Physician's Orders/[facility]   Imported By: Maryln Gottron 10/26/2009 09:09:18  _____________________________________________________________________  External Attachment:    Type:   Image     Comment:   External Document

## 2010-05-29 NOTE — Progress Notes (Signed)
Summary: Left arm and leg weakness  Phone Note Call from Patient Call back at Home Phone [phone]   Caller: Patient Summary of Call: Pt request call  would like call Initial call taken by: Judie Grieve,  September 28, 2009 8:33 AM  Follow-up for Phone Call        I spoke with the pt about symptoms she was having with her left arm and leg.  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  The pt noticed that she developed weakness in her legs and became unsteady.  The left leg was more weak than the right. The pt has recently been evaluated by Dr Lanny Hurst and was started on a Prednisone pack.  The pt thought maybe she was having a reaction to the Prednisone.  The pt contacted her PCP and did have an appt with Dr Clent Ridges on 09/27/09.  Dr Clent Ridges felt her symptoms were related to back issues.  An MRI is pending.  Last night the pt developed weakness in her left arm and is dragging her left foot.  The pt denies slurred speech, facial droop, HA or dizziness at this time.  The pt would like me to contact her PCP about these symptoms.  I did contact Kim with Dr Amador Cunas and we  felt it would be appropriate for the pt to go into the ER for evaluation.  The pt plans on going to [facility] hospital for further evauation of her symptoms.    Follow-up by: Julieta Gutting, RN, BSN,  September 28, 2009 9:09 AM

## 2010-05-29 NOTE — Progress Notes (Signed)
Summary: lab  Phone Note Call from Patient Call back at Home Phone [phone]   Reason for Call: Lab or Test Results Initial call taken by: Rudy Jew, RN,  Sep 20, 2009 3:18 PM  Follow-up for Phone Call        please notify that all lab is normal Follow-up by: Gordy Savers  MD,  Sep 21, 2009 8:30 AM  Additional Follow-up for Phone Call Additional follow up Details #1::        spoke with pt - labs all WNL - pt going for sono today. KIK Additional Follow-up by: Duard Brady LPN,  Sep 21, 2009 9:33 AM

## 2010-05-29 NOTE — Letter (Signed)
[facility]: [facility]   Imported By: Maryln Gottron 06/09/2009 15:40:31  _____________________________________________________________________  External Attachment:    Type:   Image     Comment:   External Document

## 2010-05-29 NOTE — Assessment & Plan Note (Signed)
Summary: 2 month f/u//alp  a we is a the on a from a local a leave is no and is of no the was the a for the the final in the office for a  Vital Signs:  Patient profile:   77 year old female Weight:      151 pounds Temp:     98.1 degrees F oral BP sitting:   120 / 70  (right arm) Cuff size:   regular  Vitals Entered By: Duard Brady LPN (January 24, 2010 8:26 AM) CC: 2 mos rov - doing ok - alittle tired Is Patient Diabetic? No   Primary Care Provider:  Gordy Savers, MD  CC:  2 mos rov - doing ok - alittle tired.  History of Present Illness: 77 year old patient who is seen today for follow-up.  She has a history of gastric oldster disease and cerebrovascular disease and has been on Plavix since June of this year.  She is now in the donut hole and having difficulty with her medications.  She denies any focal neurological complaints.  She was seen here 20 days ago with some palpitations that have not recurred.  She has treated dyslipidemia, and coronary artery disease.  She has hypertension, which has been well controlled  Allergies: 1)  ! Penicillin G Potassium (Penicillin G Potassium) 2)  ! * Statins 3)  ! Oxycodone Hcl (Oxycodone Hcl) 4)  ! Advil 5)  ! Prednisone 6)  Hydrocodone  Past History:  Past Medical History: Reviewed history from 11/22/2009 and no changes required. Current Problems:  TRANSIENT ISCHEMIC ATTACK (ICD-435.9) HYPERLIPIDEMIA (ICD-272.4) HYPERTENSION (ICD-401.9) CORONARY ARTERY DISEASE (ICD-414.00) URI (ICD-465.9) FATIGUE (ICD-780.79) MIGRAINE WITH AURA (ICD-346.00) OSTEOARTHRITIS (ICD-715.90) ENCOUNTER FOR REMOVAL OF SUTURES (ICD-V58.32) BRONCHITIS, VIRAL (ICD-466.0) DIVERTICULITIS, HX OF (ICD-V12.79) GERD (ICD-530.81) IBS (ICD-564.1) gastric ulcer  June 2011 hiatal hernia status post right thalamic stroke June 2011 cerebrovascular disease with extensive small vessel disease changes  Review of Systems  The patient denies  anorexia, fever, weight loss, weight gain, vision loss, decreased hearing, hoarseness, chest pain, syncope, dyspnea on exertion, peripheral edema, prolonged cough, headaches, hemoptysis, abdominal pain, melena, hematochezia, severe indigestion/heartburn, hematuria, incontinence, genital sores, muscle weakness, suspicious skin lesions, transient blindness, difficulty walking, depression, unusual weight change, abnormal bleeding, enlarged lymph nodes, angioedema, and breast masses.    Physical Exam  General:  Well-developed,well-nourished,in no acute distress; alert,appropriate and cooperative throughout examination Head:  Normocephalic and atraumatic without obvious abnormalities. No apparent alopecia or balding. Mouth:  Oral mucosa and oropharynx without lesions or exudates.  Teeth in good repair. Neck:  No deformities, masses, or tenderness noted. Lungs:  Normal respiratory effort, chest expands symmetrically. Lungs are clear to auscultation, no crackles or wheezes. Heart:  Normal rate and regular rhythm. S1 and S2 normal without gallop, murmur, click, rub or other extra sounds. Abdomen:  Bowel sounds positive,abdomen soft and non-tender without masses, organomegaly or hernias noted.   Impression & Recommendations:  Problem # 1:  ACUT GASTR ULCER W/HEMORR W/O MENTION OBST (ICD-531.00)  The following medications were removed from the medication list:    Omeprazole 20 Mg Cpdr (Omeprazole) ..... Qd Her updated medication list for this problem includes:    Pantoprazole Sodium 40 Mg Tbec (Pantoprazole sodium) ..... One daily    The following medications were removed from the medication list:    Omeprazole 20 Mg Cpdr (Omeprazole) ..... Qd Her updated medication list for this problem includes:    Pantoprazole Sodium 40 Mg Tbec (Pantoprazole sodium) ..... One daily  Orders: Venipuncture ([address]) TLB-CBC Platelet - w/Differential (85025-CBCD) Specimen Handling (60454)  Problem # 2:  CORONARY  ARTERY DISEASE (ICD-414.00)  Her updated medication list for this problem includes:    Toprol Xl 50 Mg Tb24 (Metoprolol succinate) ..... Once daily    Aspirin Buffered 325 Mg Tabs (Aspirin buff(mgcarb-alaminoac)) ..... One daily    Plavix 75 Mg Tabs (Clopidogrel bisulfate) .Marland Kitchen... Take one by mouth once daily  Her updated medication list for this problem includes:    Toprol Xl 50 Mg Tb24 (Metoprolol succinate) ..... Once daily    Aspirin Buffered 325 Mg Tabs (Aspirin buff(mgcarb-alaminoac)) ..... One daily    Plavix 75 Mg Tabs (Clopidogrel bisulfate) .Marland Kitchen... Take one by mouth once daily  Problem # 3:  HYPERTENSION (ICD-401.9)  Her updated medication list for this problem includes:    Toprol Xl 50 Mg Tb24 (Metoprolol succinate) ..... Once daily  Her updated medication list for this problem includes:    Toprol Xl 50 Mg Tb24 (Metoprolol succinate) ..... Once daily  Complete Medication List: 1)  Toprol Xl 50 Mg Tb24 (Metoprolol succinate) .... Once daily 2)  Welchol 625 Mg Tabs (Colesevelam hcl) .... 6 once daily 3)  Lorazepam 0.5 Mg Tabs (Lorazepam) .Marland Kitchen.. 1 two times a day as needed 4)  Niacin Cr 500 Mg Cpcr (Niacin) .... One daily 5)  Aspirin Buffered 325 Mg Tabs (Aspirin buff(mgcarb-alaminoac)) .... One daily 6)  Flax Seed Oil 1000 Mg Caps (Flaxseed (linseed)) .... Take 1 capsule by mouth once a day 7)  Fish Oil 1000 Mg Caps (Omega-3 fatty acids) .... Take 1 capsule by mouth once a day 8)  Centrum Silver Tabs (Multiple vitamins-minerals) .... Take 1 tablet by mouth once a day 9)  Calcium Carbonate-vitamin D 600-400 Mg-unit Tabs (Calcium carbonate-vitamin d) .... Take 1 tablet by mouth once a day 10)  Vitamin C 500 Mg Tabs (Ascorbic acid) .... Take 1 tablet by mouth once a day 11)  Plavix 75 Mg Tabs (Clopidogrel bisulfate) .... Take one by mouth once daily 12)  Lipo-gel 100-30 Mg-unit Caps (Alpha lipoic acid-vitamin e) .... Take one by mouth once daily 13)  Tylenol Extra Strength 500 Mg  Tabs (Acetaminophen) .Marland Kitchen.. 1-2 by mouth as needed pain 14)  Iron Supplement 325 (65 Fe) Mg Tabs (Ferrous sulfate) .Marland Kitchen.. 1 by mouth two times a day 15)  Pantoprazole Sodium 40 Mg Tbec (Pantoprazole sodium) .... One daily  Other Orders: TLB-Cholesterol, Total (82465-CHO)  Patient Instructions: 1)  Please schedule a follow-up appointment in 3 months. 2)  Limit your Sodium (Salt) to less than 2 grams a day(slightly less than 1/2 a teaspoon) to prevent fluid retention, swelling, or worsening of symptoms. 3)  It is important that you exercise regularly at least 20 minutes 5 times a week. If you develop chest pain, have severe difficulty breathing, or feel very tired , stop exercising immediately and seek medical attention. Prescriptions: PANTOPRAZOLE SODIUM 40 MG TBEC (PANTOPRAZOLE SODIUM) One daily  #50 x 0   Entered and Authorized by:   Gordy Savers  MD   Signed by:   Gordy Savers  MD on 01/24/2010   Method used:   Electronically to        [facility] (retail)       [address]       [place], [place]  [id number]       Ph: [phone] or [phone]       Fax: [phone]   RxID:   [id number] PLAVIX  75 MG TABS (CLOPIDOGREL BISULFATE) take one by mouth once daily  #90 x 1   Entered and Authorized by:   Gordy Savers  MD   Signed by:   Gordy Savers  MD on 01/24/2010   Method used:   Electronically to        [facility] (retail)       [address]       [place], [place]  [address]       Ph: [phone] or [phone]       Fax: [phone]   RxID:   [id number] NIACIN CR 500 MG  CPCR (NIACIN) one daily  #90 x 6   Entered and Authorized by:   Gordy Savers  MD   Signed by:   Gordy Savers  MD on 01/24/2010   Method used:   Electronically to        [facility] (retail)       [address]       [place], [place]  [id number]       Ph: [phone] or [phone]       Fax: [phone]   RxID:    [id number] WELCHOL 625 MG  TABS (COLESEVELAM HCL) 6 once daily  #360 x 4   Entered and Authorized by:   Gordy Savers  MD   Signed by:   Gordy Savers  MD on 01/24/2010   Method used:   Electronically to        [facility] (retail)       [address]       [place], [place]  [address]       Ph: [phone] or [phone]       Fax: [phone]   RxID:   [id number] TOPROL XL 50 MG  TB24 (METOPROLOL SUCCINATE) once daily  #90 x 6   Entered and Authorized by:   Gordy Savers  MD   Signed by:   Gordy Savers  MD on 01/24/2010   Method used:   Electronically to        [facility] (retail)       [address]       [place], [place]  [address]       Ph: [phone] or [phone]       Fax: [phone]   RxID:   [id number]

## 2010-05-29 NOTE — Progress Notes (Signed)
Summary: stool softner  Phone Note Call from Patient Call back at Home Phone [phone]   Caller: Patient Call For: Gordy Savers  MD Reason for Call: Talk to Doctor Details for Reason: stool softner Summary of Call: patient is calling because she would like to know if it is safe for her to use a stool softner.  she is concerned because she had a ulcer bleed last month.    Follow-up for Phone Call        OK to use Follow-up by: Gordy Savers  MD,  November 16, 2009 10:40 AM  Additional Follow-up for Phone Call Additional follow up Details #1::        Phone Call Completed Additional Follow-up by: Kern Reap CMA Duncan Dull),  November 16, 2009 10:43 AM

## 2010-05-29 NOTE — Miscellaneous (Signed)
Summary: Orders Update  Clinical Lists Changes  Orders: Added new Test order of Carotid Duplex (Carotid Duplex) - Signed 

## 2010-05-29 NOTE — Progress Notes (Signed)
Summary: refil  Phone Note Refill Request Message from:  Fax from Pharmacy  Refills Requested: Medication #1:  TOPROL XL 50 MG  TB24 once daily   Brand Name Necessary? No [facility]  fax---[phone]   Initial call taken by: Warnell Forester,  June 08, 2009 12:52 PM    Prescriptions: TOPROL XL 50 MG  TB24 (METOPROLOL SUCCINATE) once daily  #90 x 6   Entered by:   Duard Brady LPN   Authorized by:   Gordy Savers  MD   Signed by:   Duard Brady LPN on 04/54/0981   Method used:   Electronically to        [facility]* (retail)       [address]       [place], [place]  [id number]       Ph: [phone] or [phone]       Fax: [phone]   RxID:   [id number]

## 2010-05-29 NOTE — Assessment & Plan Note (Signed)
Summary: consult re: overall not feeling well/cjr   Vital Signs:  Patient profile:   77 year old female Weight:      151 pounds Temp:     98.1 degrees F oral BP sitting:   122 / 70  (right arm) Cuff size:   regular  Vitals Entered By: Duard Brady LPN (Sep 19, 2009 10:15 AM) CC: c/o not feeling well , fatigue, no appetite , has 2 migraines last week , under alot of stress at home Is Patient Diabetic? No   CC:  c/o not feeling well , fatigue, no appetite , has 2 migraines last week , and under alot of stress at home.  History of Present Illness: 77 year-old patient who is seen today for follow-up.  she has treated hypertension, osteoarthritis, dyslipidemia, and history of migraine headaches.  She has been under considerable stress due to the poor health of her husband.  She has been eating poorly.  She complains of fatigue, poor appetite, and very concerned about modest weight loss.  Also, states a number years ago.  She was followed for a renal mass that apparently did not changed in size over a period of observation.  Denies any change in her bowel habits  Preventive Screening-Counseling & Management  Alcohol-Tobacco     Smoking Status: never  Allergies: 1)  ! Penicillin G Potassium (Penicillin G Potassium) 2)  ! * Satatins Drugs 3)  ! * Oxycodone 4)  ! * Hyrdocodone  Past History:  Past Medical History: Reviewed history from 12/07/2008 and no changes required. Current Problems:  TRANSIENT ISCHEMIC ATTACK (ICD-435.9) HYPERLIPIDEMIA (ICD-272.4) HYPERTENSION (ICD-401.9) CORONARY ARTERY DISEASE (ICD-414.00) URI (ICD-465.9) FATIGUE (ICD-780.79) MIGRAINE WITH AURA (ICD-346.00) OSTEOARTHRITIS (ICD-715.90) ENCOUNTER FOR REMOVAL OF SUTURES (ICD-V58.32) BRONCHITIS, VIRAL (ICD-466.0) DIVERTICULITIS, HX OF (ICD-V12.79) GERD (ICD-530.81) IBS (ICD-564.1)  Past Surgical History: Reviewed history from 11/25/2006 and no changes required. Coronary artery bypass  graft Hysterectomy  Family History: Reviewed history from 03/05/2007 and no changes required. father died age 69, and live mother died age 31 one sister deceased from throat cancer another sister, diabetes, coronary disease family history also positive for senile dementia  Review of Systems       The patient complains of anorexia, weight loss, headaches, and muscle weakness.  The patient denies fever, weight gain, vision loss, decreased hearing, hoarseness, chest pain, syncope, dyspnea on exertion, peripheral edema, prolonged cough, hemoptysis, abdominal pain, melena, hematochezia, severe indigestion/heartburn, hematuria, incontinence, genital sores, suspicious skin lesions, difficulty walking, depression, unusual weight change, abnormal bleeding, enlarged lymph nodes, angioedema, breast masses, and testicular masses.    Physical Exam  General:  Well-developed,well-nourished,in no acute distress; alert,appropriate and cooperative throughout examination Head:  Normocephalic and atraumatic without obvious abnormalities. No apparent alopecia or balding. Eyes:  No corneal or conjunctival inflammation noted. EOMI. Perrla. Funduscopic exam benign, without hemorrhages, exudates or papilledema. Vision grossly normal. Ears:  External ear exam shows no significant lesions or deformities.  Otoscopic examination reveals clear canals, tympanic membranes are intact bilaterally without bulging, retraction, inflammation or discharge. Hearing is grossly normal bilaterally. Mouth:  Oral mucosa and oropharynx without lesions or exudates.  Teeth in good repair. Neck:  No deformities, masses, or tenderness noted. Lungs:  Normal respiratory effort, chest expands symmetrically. Lungs are clear to auscultation, no crackles or wheezes. Heart:  Normal rate and regular rhythm. S1 and S2 normal without gallop, murmur, click, rub or other extra sounds. Abdomen:  Bowel sounds positive,abdomen soft and non-tender without  masses, organomegaly or hernias noted.  prominent aortic pulsation Msk:  No deformity or scoliosis noted of thoracic or lumbar spine.  full range of motion of both hips Pulses:  absent left dorsalis pedis pulse Extremities:  No clubbing, cyanosis, edema, or deformity noted with normal full range of motion of all joints.   Skin:  Intact without suspicious lesions or rashes Cervical Nodes:  No lymphadenopathy noted Psych:  quite anxious   Impression & Recommendations:  Problem # 1:  HYPERTENSION (ICD-401.9)  Her updated medication list for this problem includes:    Toprol Xl 50 Mg Tb24 (Metoprolol succinate) ..... Once daily    Her updated medication list for this problem includes:    Toprol Xl 50 Mg Tb24 (Metoprolol succinate) ..... Once daily  Problem # 2:  CORONARY ARTERY DISEASE (ICD-414.00)  Her updated medication list for this problem includes:    Toprol Xl 50 Mg Tb24 (Metoprolol succinate) ..... Once daily    Aspirin Buffered 325 Mg Tabs (Aspirin buff(mgcarb-alaminoac)) ..... One daily    Dipyridamole 75 Mg Tabs (Dipyridamole) .Marland Kitchen... Take 1 tablet by mouth two times a day  Her updated medication list for this problem includes:    Toprol Xl 50 Mg Tb24 (Metoprolol succinate) ..... Once daily    Aspirin Buffered 325 Mg Tabs (Aspirin buff(mgcarb-alaminoac)) ..... One daily    Dipyridamole 75 Mg Tabs (Dipyridamole) .Marland Kitchen... Take 1 tablet by mouth two times a day  Problem # 3:  WEIGHT LOSS, RECENT (ICD-783.21)  Complete Medication List: 1)  Toprol Xl 50 Mg Tb24 (Metoprolol succinate) .... Once daily 2)  Welchol 625 Mg Tabs (Colesevelam hcl) .... 6 once daily 3)  Lorazepam 0.5 Mg Tabs (Lorazepam) .Marland Kitchen.. 1 two times a day as needed 4)  Niacin Cr 500 Mg Cpcr (Niacin) .... One daily 5)  Aspirin Buffered 325 Mg Tabs (Aspirin buff(mgcarb-alaminoac)) .... One daily 6)  Dipyridamole 75 Mg Tabs (Dipyridamole) .... Take 1 tablet by mouth two times a day 7)  Flax Seed Oil 1000 Mg Caps  (Flaxseed (linseed)) .... Take 1 capsule by mouth once a day 8)  Fish Oil 1000 Mg Caps (Omega-3 fatty acids) .... Take 1 capsule by mouth once a day 9)  Centrum Silver Tabs (Multiple vitamins-minerals) .... Take 1 tablet by mouth once a day 10)  Calcium Carbonate-vitamin D 600-400 Mg-unit Tabs (Calcium carbonate-vitamin d) .... Take 1 tablet by mouth once a day 11)  Vitamin C 500 Mg Tabs (Ascorbic acid) .... Take 1 tablet by mouth once a day 12)  Doxycycline Hyclate 100 Mg Caps (Doxycycline hyclate) .... One bid  Other Orders: Venipuncture (95621) TLB-BMP (Basic Metabolic Panel-BMET) (80048-METABOL) TLB-CBC Platelet - w/Differential (85025-CBCD) TLB-Hepatic/Liver Function Pnl (80076-HEPATIC) TLB-TSH (Thyroid Stimulating Hormone) (84443-TSH) TLB-Sedimentation Rate (ESR) (85652-ESR) Doppler Referral (Doppler)  Patient Instructions: 1)  Please schedule a follow-up appointment in 3 months. 2)  Limit your Sodium (Salt) to less than 2 grams a day(slightly less than 1/2 a teaspoon) to prevent fluid retention, swelling, or worsening of symptoms. 3)  It is important that you exercise regularly at least 20 minutes 5 times a week. If you develop chest pain, have severe difficulty breathing, or feel very tired , stop exercising immediately and seek medical attention. 4)  Take 400-600mg  of Ibuprofen (Advil, Motrin) with food every 4-6 hours as needed for relief of pain or comfort of fever.

## 2010-05-29 NOTE — Progress Notes (Signed)
Summary: so sick & weak/ate chinese yesterday  Phone Note Call from Patient Call back at Home Phone [phone]   Summary of Call: Sick & so weak. Nausea, but cannot vomit.  Bowels not moving like they used to.  6x last pm little amounts. Taking liquids.  Up & around.  Therapy coming into home.  They say I'm doing well.  Making short trips out.  Last night getting sick was unexpected.  Acid reflux,  Hot & cold.  Ate Chinese yesterday.   Nothing seems normal.   Stroke & hospital.  Doesn't think she can get in.   [name].  NKDA.    Initial call taken by: Rudy Jew, RN,  October 09, 2009 9:30 AM  Follow-up for Phone Call        Phenergan 25 mg  #20 one every 4-6 hrs for  nausea; OTC prilosec 20 mg daily Follow-up by: Gordy Savers  MD,  October 09, 2009 10:05 AM  Additional Follow-up for Phone Call Additional follow up Details #1::        Phone Call Completed Additional Follow-up by: Rudy Jew, RN,  October 09, 2009 10:11 AM    New/Updated Medications: PROMETHAZINE HCL 25 MG TABS (PROMETHAZINE HCL) One every 4 - 6 hours as needed nausea PRILOSEC OTC 20 MG TBEC (OMEPRAZOLE MAGNESIUM) daily Prescriptions: PROMETHAZINE HCL 25 MG TABS (PROMETHAZINE HCL) One every 4 - 6 hours as needed nausea  #20 x 0   Entered by:   Rudy Jew, RN   Authorized by:   Gordy Savers  MD   Signed by:   Rudy Jew, RN on 10/09/2009   Method used:   Electronically to        [facility] (retail)       [address]       [place], [place]  [address]       Ph: [phone] or [phone]       Fax: [phone]   RxID:   [id number]

## 2010-05-29 NOTE — Progress Notes (Signed)
Summary: refill lorazopam  Phone Note Refill Request Message from:  Patient on Sep 12, 2009 4:26 PM  Refills Requested: Medication #1:  LORAZEPAM 0.5 MG  TABS 1 two times a day as needed   Last Refilled: 08/04/2009 [facility]   [phone]   Method Requested: Telephone to Pharmacy Initial call taken by: Duard Brady LPN,  Sep 12, 2009 4:27 PM  Follow-up for Phone Call        #60 RF 3 Follow-up by: Gordy Savers  MD,  Sep 12, 2009 5:08 PM    Prescriptions: LORAZEPAM 0.5 MG  TABS (LORAZEPAM) 1 two times a day as needed  #60 x 3   Entered by:   Duard Brady LPN   Authorized by:   Gordy Savers  MD   Signed by:   Duard Brady LPN on 81/19/1478   Method used:   Historical   RxID:   [id number]  called to pharm. KIK

## 2010-05-29 NOTE — Progress Notes (Signed)
Summary: lab results  Phone Note Call from Patient   Caller: Patient Call For: Gordy Savers  MD Summary of Call: [phone] Asking for lab results. Initial call taken by: Lynann Beaver CMA,  November 29, 2009 11:25 AM  Follow-up for Phone Call        cbc ok Follow-up by: Gordy Savers  MD,  November 29, 2009 12:37 PM  Additional Follow-up for Phone Call Additional follow up Details #1::        called pt - informed labs ok. KIK

## 2010-05-29 NOTE — Procedures (Signed)
Summary: Upper Endoscopy  Patient: Marissa Bowen Note: All result statuses are Final unless otherwise noted.  Tests: (1) Upper Endoscopy (EGD)   EGD Upper Endoscopy       DONE     [facility]     [address] [place], [place]  [address]           ENDOSCOPY PROCEDURE REPORT           PATIENT:  Marissa, Bowen  MR#:  [id number]     BIRTHDATE:  1926/12/03, 82 yrs. old  GENDER:  female           ENDOSCOPIST:  Iva Boop, MD, [facility]           PROCEDURE DATE:  10/10/2009     PROCEDURE:  EGD with biopsy     ASA CLASS:  Class III     INDICATIONS:  hematemesis, melena           MEDICATIONS:   Fentanyl 40 mcg, Versed 4 mg     TOPICAL ANESTHETIC:  Cetacaine Spray           DESCRIPTION OF PROCEDURE:   After the risks benefits and     alternatives of the procedure were thoroughly explained, informed     consent was obtained.  The  endoscope was introduced through the     mouth and advanced to the second portion of the duodenum, without     limitations.  The instrument was slowly withdrawn as the mucosa     was fully examined.     <<PROCEDUREIMAGES>>           An ulcer was found in the antrum. 7-8 mm wide but deep ulcer,     clean based, in pre-pyloric antrum on lesser curve aspect.     Multiple biopsies were obtained and sent to pathology.  A hiatal     hernia was found. It was 2 cm in size.  Otherwise the examination     was normal.    Retroflexed views revealed no abnormalities.    The     scope was then withdrawn from the patient and the procedure     completed.           COMPLICATIONS:  None           ENDOSCOPIC IMPRESSION:     1) Ulcer in the antrum - clean-basd and benign-appearing. Most     likely due to Advil and prednisone use with some contribution of     aspirin. Biopsies taken.     2) 2 cm hiatal hernia     3) Otherwise normal examination     RECOMMENDATIONS:     1) daily PPI forever - would prescribe Nexium 40 mg each AM 30     minutes before  breakfast (I have checked and this is on her     formulary)     2) Resume Plavix (she just had a TIA 6/2-3) and daily ASA soon     (I will make clear by time of dc) and avoid NSAIDS and prednisone     as much as possible - She has been on prednisone, Advil as well as     ASA and Plavix. 3) Await biopsies and decide on need for repeat     EGD then. I will follow-up on this and make recommendations     4) iron therapy and confirmation of return to normal Hgb  eventually     5) Should be ok for discharge tomorrow barring further problems.     Needs to see PCP in 1-2 weeks after dc to coordinate Rx therapy     further           REPEAT EXAM:  In for as needed.           Iva Boop, MD, [facility] Graham           CC:  The Patient     Gordy Savers, MD     Herby Abraham, MD     Loura Pardon, MD     Lunette Stands, MD           n.     Rosalie Doctor:   Iva Boop at 10/10/2009 10:48 AM           Minus Breeding, [id number]  Note: An exclamation mark (!) indicates a result that was not dispersed into the flowsheet. Document Creation Date: 10/10/2009 10:49 AM _______________________________________________________________________  (1) Order result status: Final Collection or observation date-time: 10/10/2009 10:26 Requested date-time:  Receipt date-time:  Reported date-time:  Referring Physician:   Ordering Physician: Stan Head [phone]) Specimen Source:  Source: Launa Grill Order Number: [id number] Lab site:

## 2010-05-29 NOTE — Progress Notes (Signed)
Summary: refill plavix  Phone Note Refill Request Message from:  Fax from Pharmacy on October 27, 2009 3:30 PM  Refills Requested: Medication #1:  PLAVIX 75 MG TABS take one by mouth once daily [facility]    Method Requested: Fax to Local Pharmacy Initial call taken by: Duard Brady LPN,  October 27, 9145 3:31 PM    Prescriptions: PLAVIX 75 MG TABS (CLOPIDOGREL BISULFATE) take one by mouth once daily  #90 x 1   Entered by:   Duard Brady LPN   Authorized by:   Gordy Savers  MD   Signed by:   Duard Brady LPN on 82/95/6213   Method used:   Faxed to ...       [facility] (retail)       [address]       [place], [place]  [address]       Ph: [phone] or [phone]       Fax: [phone]   RxID:   [id number]

## 2010-05-29 NOTE — Progress Notes (Signed)
Summary: ? re meds  Phone Note Call from Patient Call back at Home Phone [phone]   Caller: Patient Call For: Marissa Bowen Reason for Call: Talk to Nurse Summary of Call: Patient wants to know if she should be taking iron pills, states that she was seen in the hospital last week and thought that Dr Marissa Bowen was going to call in a prescriptions for it. Initial call taken by: Tawni Levy,  October 20, 2009 11:05 AM  Follow-up for Phone Call        patient was not d/c'd on by mouth iron.  It is not listed as a d/c med.  She has a follow up appointment with Dr Marissa Bowen on 10/23/09 Monday.  I have asked her to discuss this with Dr Marissa Bowen on Monday at her appointment  Follow-up by: Darcey Nora RN, CGRN,  October 20, 2009 11:12 AM  Additional Follow-up for Phone Call Additional follow up Details #1::        will address after lab review Additional Follow-up by: Iva Boop MD, [facility] Graham,  October 24, 2009 8:05 AM

## 2010-05-29 NOTE — Progress Notes (Signed)
  Phone Note Other Incoming   Caller: Karen/ [facility] Request: Send information Initial call taken by: Denny Peon    FAxed all Recent Cardiac over to [phone] [facility]  May 02, 2009 12:54 PM

## 2010-05-29 NOTE — Progress Notes (Signed)
Summary: worse  Phone Note Call from Patient Call back at [facility] Phone [phone]   Summary of Call: Cold worse since ov.  Spells of cough and feels bad, no energy.  No fever.  Chest feels scratchy & uncomfortable sometimes.  Requesting antibiotic.   [name].  Allergic to codeine.   Initial call taken by: Rudy Jew, RN,  July 25, 2009 10:58 AM  Follow-up for Phone Call        Phone Call Completed Follow-up by: Rudy Jew, RN,  July 25, 2009 11:49 AM    New/Updated Medications: DOXYCYCLINE HYCLATE 100 MG CAPS (DOXYCYCLINE HYCLATE) One bid Prescriptions: DOXYCYCLINE HYCLATE 100 MG CAPS (DOXYCYCLINE HYCLATE) One bid  #14 x 0   Entered by:   Rudy Jew, RN   Authorized by:   Gordy Savers  MD   Signed by:   Rudy Jew, RN on 07/25/2009   Method used:   Electronically to        [facility] (retail)       [address]       [place], [place]  [id number]       Ph: [phone] or [phone]       Fax: [phone]   RxID:   [id number]  doxycycline 100 mg #14 one twice daily; ROV if desired

## 2010-05-29 NOTE — Progress Notes (Signed)
Summary: dizzy wobbly  Phone Note Call from Patient Call back at Home Phone [phone]   Caller: vm 4:27 Call For: Marissa Bowen Summary of Call: A little dizzy & wobbly.  On prednisone.  ?Reaction. Initial call taken by: Rudy Jew, RN,  Sep 26, 2009 5:08 PM  Follow-up for Phone Call        spoke with pt - on a tapering dose of prednisone from Dr. Lynnea Maizes - started friday 5mg  tabs - took 6 and has tapered to 1 today . Was out in the warm temps today - now feels dizzy and 'wobbly' legs . Instructed to stay indoors and cool , drink lots of water and gatoraid, rest and eat small freq meals.  If still feeling bad tomorrow - may need to see. KIK Follow-up by: Duard Brady LPN,  Sep 26, 2009 5:27 PM

## 2010-05-29 NOTE — Progress Notes (Signed)
Summary: black emesis  Phone Note Call from Patient Call back at Home Phone [phone]   Summary of Call: Has thrown up a pile of black stuff, all over dining room floor. Initial call taken by: Rudy Jew, RN,  October 09, 2009 12:28 PM  Follow-up for Phone Call        Per Dr. Kirtland Bouchard go to hospital now.   Follow-up by: Rudy Jew, RN,  October 09, 2009 12:35 PM

## 2010-05-29 NOTE — Assessment & Plan Note (Signed)
Summary: CONGESTION//CCM   Vital Signs:  Patient profile:   77 year old female Weight:      154 pounds Temp:     97.7 degrees F oral BP sitting:   150 / 70  (right arm) Cuff size:   regular  Vitals Entered By: Duard Brady LPN (July 18, 2009 10:27 AM) CC: c/o productive cough congestion, fatigue Is Patient Diabetic? No   CC:  c/o productive cough congestion and fatigue.  History of Present Illness: 77 year old who presents with a 3-day history of mildly , productive cough, fatigue.  Denies any shortness of breath, fever, or chest pain.  Her husband has just been released from the hospital for a URI with inspiratory insufficiency.  He does have a history of advanced COPD.  She does have a history of treated dyslipidemia.  Allergies: 1)  ! Penicillin G Potassium (Penicillin G Potassium) 2)  ! * Satatins Drugs 3)  ! * Oxycodone 4)  ! * Hyrdocodone  Past History:  Past Medical History: Reviewed history from 12/07/2008 and no changes required. Current Problems:  TRANSIENT ISCHEMIC ATTACK (ICD-435.9) HYPERLIPIDEMIA (ICD-272.4) HYPERTENSION (ICD-401.9) CORONARY ARTERY DISEASE (ICD-414.00) URI (ICD-465.9) FATIGUE (ICD-780.79) MIGRAINE WITH AURA (ICD-346.00) OSTEOARTHRITIS (ICD-715.90) ENCOUNTER FOR REMOVAL OF SUTURES (ICD-V58.32) BRONCHITIS, VIRAL (ICD-466.0) DIVERTICULITIS, HX OF (ICD-V12.79) GERD (ICD-530.81) IBS (ICD-564.1)  Past Surgical History: Reviewed history from 11/25/2006 and no changes required. Coronary artery bypass graft Hysterectomy  Review of Systems       The patient complains of prolonged cough.  The patient denies anorexia, fever, weight loss, weight gain, vision loss, decreased hearing, hoarseness, chest pain, syncope, dyspnea on exertion, peripheral edema, headaches, hemoptysis, abdominal pain, melena, hematochezia, severe indigestion/heartburn, hematuria, incontinence, genital sores, muscle weakness, suspicious skin lesions, transient  blindness, difficulty walking, depression, unusual weight change, abnormal bleeding, enlarged lymph nodes, angioedema, and breast masses.    Physical Exam  General:  elderly alert, no distress.  Blood pressure 130/70 Head:  Normocephalic and atraumatic without obvious abnormalities. No apparent alopecia or balding. Eyes:  No corneal or conjunctival inflammation noted. EOMI. Perrla. Funduscopic exam benign, without hemorrhages, exudates or papilledema. Vision grossly normal. Ears:  External ear exam shows no significant lesions or deformities.  Otoscopic examination reveals clear canals, tympanic membranes are intact bilaterally without bulging, retraction, inflammation or discharge. Hearing is grossly normal bilaterally. Mouth:  Oral mucosa and oropharynx without lesions or exudates.  Teeth in good repair. Neck:  No deformities, masses, or tenderness noted. Lungs:  Normal respiratory effort, chest expands symmetrically. Lungs are clear to auscultation, no crackles or wheezes. O2 saturation 97 Heart:  Normal rate and regular rhythm. S1 and S2 normal without gallop, murmur, click, rub or other extra sounds. Abdomen:  Bowel sounds positive,abdomen soft and non-tender without masses, organomegaly or hernias noted.   Impression & Recommendations:  Problem # 1:  URI (ICD-465.9)  Her updated medication list for this problem includes:    Aspirin Buffered 325 Mg Tabs (Aspirin buff(mgcarb-alaminoac)) ..... One daily  Her updated medication list for this problem includes:    Aspirin Buffered 325 Mg Tabs (Aspirin buff(mgcarb-alaminoac)) ..... One daily  Problem # 2:  HYPERTENSION (ICD-401.9)  Her updated medication list for this problem includes:    Toprol Xl 50 Mg Tb24 (Metoprolol succinate) ..... Once daily  Her updated medication list for this problem includes:    Toprol Xl 50 Mg Tb24 (Metoprolol succinate) ..... Once daily  Problem # 3:  HYPERLIPIDEMIA (ICD-272.4)  Her updated medication  list for this problem includes:  Welchol 625 Mg Tabs (Colesevelam hcl) .Marland KitchenMarland KitchenMarland KitchenMarland Kitchen 6 once daily    Niacin Cr 500 Mg Cpcr (Niacin) ..... One daily    Her updated medication list for this problem includes:    Welchol 625 Mg Tabs (Colesevelam hcl) .Marland KitchenMarland KitchenMarland KitchenMarland Kitchen 6 once daily    Niacin Cr 500 Mg Cpcr (Niacin) ..... One daily  Complete Medication List: 1)  Toprol Xl 50 Mg Tb24 (Metoprolol succinate) .... Once daily 2)  Welchol 625 Mg Tabs (Colesevelam hcl) .... 6 once daily 3)  Lorazepam 0.5 Mg Tabs (Lorazepam) .Marland Kitchen.. 1 two times a day as needed 4)  Niacin Cr 500 Mg Cpcr (Niacin) .... One daily 5)  Aspirin Buffered 325 Mg Tabs (Aspirin buff(mgcarb-alaminoac)) .... One daily 6)  Dipyridamole 75 Mg Tabs (Dipyridamole) .... Take 1 tablet by mouth two times a day 7)  Flax Seed Oil 1000 Mg Caps (Flaxseed (linseed)) .... Take 1 capsule by mouth once a day 8)  Fish Oil 1000 Mg Caps (Omega-3 fatty acids) .... Take 1 capsule by mouth once a day 9)  Centrum Silver Tabs (Multiple vitamins-minerals) .... Take 1 tablet by mouth once a day 10)  Calcium Carbonate-vitamin D 600-400 Mg-unit Tabs (Calcium carbonate-vitamin d) .... Take 1 tablet by mouth once a day 11)  Vitamin C 500 Mg Tabs (Ascorbic acid) .... Take 1 tablet by mouth once a day  Other Orders: Venipuncture (20254) TLB-Lipid Panel (80061-LIPID)  Patient Instructions: 1)  Get plenty of rest, drink lots of clear liquids, and use Tylenol or Ibuprofen for fever and comfort. Return in 7-10 days if you're not better:sooner if you're feeling worse. 2)  Please schedule a follow-up appointment in 6 months. 3)  Limit your Sodium (Salt) to less than 2 grams a day(slightly less than 1/2 a teaspoon) to prevent fluid retention, swelling, or worsening of symptoms.

## 2010-05-29 NOTE — Progress Notes (Signed)
Summary: calling with update  Phone Note Call from Patient   Caller: Patient Reason for Call: Talk to Nurse Summary of Call: pt wants a call re her medication-pls call [phone] Initial call taken by: Glynda Jaeger,  December 04, 2009 10:38 AM  Follow-up for Phone Call        The pt called because she is having nausea and is afraid she is going to throw-up blood again.  The pt said she stopped taking Plavix on her own due to fear of bleeding in her stomach.  I reviewed the pt's endoscopy report done by Dr Leone Payor and told her that Dr Leone Payor recommended restarting Plavix and ASA and life-long PPI therapy.  The pt was recently started on Protonix.  The pt is very anxious and not sleeping and I told her this can make her sick on her stomach.  I recommeded that the pt call her PCP to discuss her anxiety and possible need for medical therapy. Pt agreed with plan.  Follow-up by: Julieta Gutting, RN, BSN,  December 04, 2009 3:11 PM

## 2010-05-29 NOTE — Progress Notes (Signed)
Summary: refill lorazepam  Phone Note Call from Patient   Caller: Patient Summary of Call: Pt called and said that [facility] has been out of power all week and just gotten their power restored today. Please resend prescription for Lorazepam asap today.  Pt is out of meds. Pt wants to be notified when this has been done.  Initial call taken by: Lucy Antigua,  August 03, 2009 3:57 PM  Follow-up for Phone Call        Pt called to ck on status of refill of med: LORAZEPAM 0.5 MG ..... Pt can be reached at [phone] with any questions or concerns.  Follow-up by: Debbra Riding,  August 04, 2009 9:08 AM  Additional Follow-up for Phone Call Additional follow up Details #1::        Pt called to check on status of Lorazepam and wants to be called when refill has been sent. [phone]. Additional Follow-up by: Lucy Antigua,  August 04, 2009 10:42 AM    Prescriptions: LORAZEPAM 0.5 MG  TABS (LORAZEPAM) 1 two times a day as needed  #100 x 1   Entered by:   Duard Brady LPN   Authorized by:   Gordy Savers  MD   Signed by:   Duard Brady LPN on 16/01/9603   Method used:   Historical   RxID:   [id number]  rx called to pharm - pt aware . KIK

## 2010-05-29 NOTE — Progress Notes (Signed)
Summary: pantoprazole  Phone Note Call from Patient Call back at [facility] Phone [phone]   Summary of Call: Last week or two stomach upset.  Today nausea. On Omeprazole.  Druggist said Protonix might be easier on her stomach & works with Plavix.  Needs something to keep her stomach calm.  Doesn't want to throw up blood again.   [name].  Allergic or intolerant prednsione, penicillin.   Initial call taken by: Rudy Jew, RN,  December 01, 2009 9:34 AM  Follow-up for Phone Call        OK generic protonix 40 mg  #50 one daily Follow-up by: Gordy Savers  MD,  December 01, 2009 12:55 PM  Additional Follow-up for Phone Call Additional follow up Details #1::        Phone Call Completed Additional Follow-up by: Rudy Jew, RN,  December 01, 2009 1:08 PM    New/Updated Medications: PANTOPRAZOLE SODIUM 40 MG TBEC (PANTOPRAZOLE SODIUM) One daily Prescriptions: PANTOPRAZOLE SODIUM 40 MG TBEC (PANTOPRAZOLE SODIUM) One daily  #50 x 0   Entered by:   Rudy Jew, RN   Authorized by:   Gordy Savers  MD   Signed by:   Rudy Jew, RN on 12/01/2009   Method used:   Electronically to        [facility] (retail)       [address]       [place], [place]  [id number]       Ph: [phone] or [phone]       Fax: [phone]   RxID:   [id number]

## 2010-05-29 NOTE — Assessment & Plan Note (Signed)
Summary: dizziness/needs heart check/njr   Vital Signs:  Patient profile:   77 year old female Weight:      148 pounds Temp:     98.4 degrees F oral BP sitting:   160 / 70  (right arm) Cuff size:   regular  Vitals Entered By: Duard Brady LPN (January 04, 2010 4:05 PM) CC: c/o dizziness and heart 'fluttering' Is Patient Diabetic? No   Primary Care Provider:  Gordy Savers, MD  CC:  c/o dizziness and heart 'fluttering'.  History of Present Illness: an 77 year old patient who is seen today for follow-up.  this morning she awoke, complain of some generalized achiness.  This was associated with some mild intermittent dizziness.  She later developed, which he described as palpitations, with a sense of fluttering in the neck area.  However,  the palpitations were associated with a slow regular heart rate; she  does have a history of cerebral vascular disease and a fairly recent history of a bleeding gastric ulcer. at the present time, she feels well  Allergies: 1)  ! Penicillin G Potassium (Penicillin G Potassium) 2)  ! * Statins 3)  ! Oxycodone Hcl (Oxycodone Hcl) 4)  ! Advil 5)  ! Prednisone 6)  Hydrocodone  Past History:  Past Medical History: Reviewed history from 11/22/2009 and no changes required. Current Problems:  TRANSIENT ISCHEMIC ATTACK (ICD-435.9) HYPERLIPIDEMIA (ICD-272.4) HYPERTENSION (ICD-401.9) CORONARY ARTERY DISEASE (ICD-414.00) URI (ICD-465.9) FATIGUE (ICD-780.79) MIGRAINE WITH AURA (ICD-346.00) OSTEOARTHRITIS (ICD-715.90) ENCOUNTER FOR REMOVAL OF SUTURES (ICD-V58.32) BRONCHITIS, VIRAL (ICD-466.0) DIVERTICULITIS, HX OF (ICD-V12.79) GERD (ICD-530.81) IBS (ICD-564.1) gastric ulcer  June 2011 hiatal hernia status post right thalamic stroke June 2011 cerebrovascular disease with extensive small vessel disease changes  Review of Systems  The patient denies anorexia, fever, weight loss, weight gain, vision loss, decreased hearing,  hoarseness, chest pain, syncope, dyspnea on exertion, peripheral edema, prolonged cough, headaches, hemoptysis, abdominal pain, melena, hematochezia, severe indigestion/heartburn, hematuria, incontinence, genital sores, muscle weakness, suspicious skin lesions, transient blindness, difficulty walking, depression, unusual weight change, abnormal bleeding, enlarged lymph nodes, angioedema, and breast masses.    Physical Exam  General:  elderly alert, no distress.  Blood pressure 140/80, pulse rate 62 Head:  Normocephalic and atraumatic without obvious abnormalities. No apparent alopecia or balding. Mouth:  Oral mucosa and oropharynx without lesions or exudates.  Teeth in good repair. Neck:  No deformities, masses, or tenderness noted. Lungs:  Normal respiratory effort, chest expands symmetrically. Lungs are clear to auscultation, no crackles or wheezes.  O2 saturation 97% Heart:  Normal rate and regular rhythm. S1 and S2 normal without gallop, murmur, click, rub or other extra sounds. Abdomen:  Bowel sounds positive,abdomen soft and non-tender without masses, organomegaly or hernias noted. Msk:  No deformity or scoliosis noted of thoracic or lumbar spine.   Extremities:  No clubbing, cyanosis, edema, or deformity noted with normal full range of motion of all joints.     Impression & Recommendations:  Problem # 1:  PALPITATIONS, HX OF (ICD-V12.50)  Problem # 2:  HYPERTENSION (ICD-401.9)  Her updated medication list for this problem includes:    Toprol Xl 50 Mg Tb24 (Metoprolol succinate) ..... Once daily  Orders: EKG w/ Interpretation (93000)  Problem # 3:  CORONARY ARTERY DISEASE (ICD-414.00)  Her updated medication list for this problem includes:    Toprol Xl 50 Mg Tb24 (Metoprolol succinate) ..... Once daily    Aspirin Buffered 325 Mg Tabs (Aspirin buff(mgcarb-alaminoac)) ..... One daily    Plavix 75  Mg Tabs (Clopidogrel bisulfate) .Marland Kitchen... Take one by mouth once daily  Orders: EKG  w/ Interpretation (93000)  Complete Medication List: 1)  Toprol Xl 50 Mg Tb24 (Metoprolol succinate) .... Once daily 2)  Welchol 625 Mg Tabs (Colesevelam hcl) .... 6 once daily 3)  Lorazepam 0.5 Mg Tabs (Lorazepam) .Marland Kitchen.. 1 two times a day as needed 4)  Niacin Cr 500 Mg Cpcr (Niacin) .... One daily 5)  Aspirin Buffered 325 Mg Tabs (Aspirin buff(mgcarb-alaminoac)) .... One daily 6)  Flax Seed Oil 1000 Mg Caps (Flaxseed (linseed)) .... Take 1 capsule by mouth once a day 7)  Fish Oil 1000 Mg Caps (Omega-3 fatty acids) .... Take 1 capsule by mouth once a day 8)  Centrum Silver Tabs (Multiple vitamins-minerals) .... Take 1 tablet by mouth once a day 9)  Calcium Carbonate-vitamin D 600-400 Mg-unit Tabs (Calcium carbonate-vitamin d) .... Take 1 tablet by mouth once a day 10)  Vitamin C 500 Mg Tabs (Ascorbic acid) .... Take 1 tablet by mouth once a day 11)  Plavix 75 Mg Tabs (Clopidogrel bisulfate) .... Take one by mouth once daily 12)  Lipo-gel 100-30 Mg-unit Caps (Alpha lipoic acid-vitamin e) .... Take one by mouth once daily 13)  Tylenol Extra Strength 500 Mg Tabs (Acetaminophen) .Marland Kitchen.. 1-2 by mouth as needed pain 14)  Iron Supplement 325 (65 Fe) Mg Tabs (Ferrous sulfate) .Marland Kitchen.. 1 by mouth two times a day 15)  Omeprazole 20 Mg Cpdr (Omeprazole) .... Qd 16)  Pantoprazole Sodium 40 Mg Tbec (Pantoprazole sodium) .... One daily  Patient Instructions: 1)  Limit your Sodium (Salt) to less than 2 grams a day(slightly less than 1/2 a teaspoon) to prevent fluid retention, swelling, or worsening of symptoms. 2)  call if your  symptoms worsen

## 2010-05-29 NOTE — Assessment & Plan Note (Signed)
Summary: POST HOSP/YF   History of Present Illness Visit Type: Follow-up Visit Primary GI MD: Lina Sar MD Primary Provider: Gordy Savers, MD Chief Complaint: [facility]  History of Present Illness:   77 yo white woman recently hospitalized for GI bleeding and was found to have a gastric ulcer. Patient here post hospital she states that she  is doing much better but her throat is still sore.  It has been sore since the endoscopy and the admisson with nausea and vomting. Otherwse normal. Wants to eat normally, including chocolate and blackberries. No problems using Tylenol ES for joints   GI Review of Systems      Denies abdominal pain, acid reflux, belching, bloating, chest pain, dysphagia with liquids, dysphagia with solids, heartburn, loss of appetite, nausea, vomiting, vomiting blood, weight loss, and  weight gain.        Denies anal fissure, black tarry stools, change in bowel habit, constipation, diarrhea, diverticulosis, fecal incontinence, heme positive stool, hemorrhoids, irritable bowel syndrome, jaundice, light color stool, liver problems, rectal bleeding, and  rectal pain. Preventive Screening-Counseling & Management  Alcohol-Tobacco     Smoking Status: never      Drug Use:  no.      Current Medications (verified): 1)  Toprol Xl 50 Mg  Tb24 (Metoprolol Succinate) .... Once Daily 2)  Welchol 625 Mg  Tabs (Colesevelam Hcl) .... 6 Once Daily 3)  Lorazepam 0.5 Mg  Tabs (Lorazepam) .Marland Kitchen.. 1 Two Times A Day As Needed 4)  Niacin Cr 500 Mg  Cpcr (Niacin) .... One Daily 5)  Aspirin Buffered 325 Mg Tabs (Aspirin Buff(Mgcarb-Alaminoac)) .... One Daily 6)  Flax Seed Oil 1000 Mg Caps (Flaxseed (Linseed)) .... Take 1 Capsule By Mouth Once A Day 7)  Fish Oil 1000 Mg Caps (Omega-3 Fatty Acids) .... Take 1 Capsule By Mouth Once A Day 8)  Centrum Silver  Tabs (Multiple Vitamins-Minerals) .... Take 1 Tablet By Mouth Once A Day 9)  Calcium Carbonate-Vitamin D 600-400 Mg-Unit   Tabs (Calcium Carbonate-Vitamin D) .... Take 1 Tablet By Mouth Once A Day 10)  Vitamin C 500 Mg  Tabs (Ascorbic Acid) .... Take 1 Tablet By Mouth Once A Day 11)  Plavix 75 Mg Tabs (Clopidogrel Bisulfate) .... Take One By Mouth Once Daily 12)  Lipo-Gel 100-30 Mg-Unit Caps (Alpha Lipoic Acid-Vitamin E) .... Take One By Mouth Once Daily 13)  Lorazepam 1 Mg Tabs (Lorazepam) .... Take One By Mouth Two Times A Day 14)  Nexium 40 Mg Cpdr (Esomeprazole Magnesium) .... Take One By Mouth Once Daily 15)  Tylenol Extra Strength 500 Mg Tabs (Acetaminophen) .Marland Kitchen.. 1-2 By Mouth As Needed Pain  Allergies: 1)  ! Penicillin G Potassium (Penicillin G Potassium) 2)  ! * Statins 3)  ! Oxycodone Hcl (Oxycodone Hcl) 4)  ! Advil 5)  ! Prednisone 6)  Hydrocodone  Past History:  Past Medical History: Current Problems:  TRANSIENT ISCHEMIC ATTACK (ICD-435.9) HYPERLIPIDEMIA (ICD-272.4) HYPERTENSION (ICD-401.9) CORONARY ARTERY DISEASE (ICD-414.00) URI (ICD-465.9) FATIGUE (ICD-780.79) MIGRAINE WITH AURA (ICD-346.00) OSTEOARTHRITIS (ICD-715.90) ENCOUNTER FOR REMOVAL OF SUTURES (ICD-V58.32) BRONCHITIS, VIRAL (ICD-466.0) DIVERTICULITIS, HX OF (ICD-V12.79) GERD (ICD-530.81) IBS (ICD-564.1) gastric ulcer  hiatal hernia  Past Surgical History: Coronary artery bypass graft Hysterectomy Tonsillectomy  Family History: father died age 75, and live mother died age 16 one sister deceased from throat cancer another sister, diabetes, coronary disease family history also positive for senile dementia Family History of Heart Disease: sister No FH of Colon Cancer:  Social History: Married  Patient has never smoked.  Alcohol Use - no Illicit Drug Use - no Drug Use:  no  Vital Signs:  Patient profile:   77 year old female Height:      68 inches Weight:      148.8 pounds BMI:     22.71 Pulse rate:   78 / minute Pulse rhythm:   regular BP sitting:   132 / 62  (right arm) Cuff size:   regular  Vitals  Entered By: Harlow Mares CMA Duncan Dull) (October 23, 2009 3:56 PM)  Physical Exam  General:  Well developed, well nourished, no acute distress.   Impression & Recommendations:  Problem # 1:  ACUT GASTR ULCER W/HEMORR W/O MENTION OBST (ICD-531.00) Assessment Improved Bleive this was due to NSAID's (Advil for hip) as well as prednisone. To stay on  a chronic PPI No H. pylori seen at biopsy and in this setting with benign bxs would not rescope. Orders: TLB-CBC Platelet - w/Differential (85025-CBCD) TLB-Ferritin ([id number]-FER)  Problem # 2:  ACUTE POSTHEMORRHAGIC ANEMIA (ICD-285.1) Assessment: Improved  Orders: TLB-CBC Platelet - w/Differential (85025-CBCD) TLB-Ferritin ([id number])  Problem # 3:  TRANSIENT ISCHEMIC ATTACK (ICD-435.9) Assessment: Improved This appears resolved though she has some concern that it may have been a migraine (admits no typical aura) To stay on Plavix and follow-up with Dr. Amador Cunas.  Patient Instructions: 1)  Stay on Nexium 40 mg daily bt if you go into the donut hole take omeprazole 20 mg 2 each day (over the counter) instead. 2)  Please go to the basement to have your lab tests drawn today. 3)  We will call with results and plans re: lab results. 4)  Schedule a follow-up with Dr. Amador Cunas re: TIA 5)  Copy sent to : Eleonore Chiquito, MD 6)  The medication list was reviewed and reconciled.  All changed / newly prescribed medications were explained.  A complete medication list was provided to the patient / caregiver.

## 2010-05-29 NOTE — Progress Notes (Addendum)
Summary: refill lorazepam  Phone Note From Pharmacy   Caller: [facility] Call For: k  Summary of Call: refill lorazepam 0.5mg  1 by mouth two times a day as needed Initial call taken by: Alfred Levins, CMA,  February 26, 2010 1:24 PM  Follow-up for Phone Call        Pt called back to check on status of refill.  Follow-up by: Debbra Riding,  February 27, 2010 9:51 AM  Additional Follow-up for Phone Call Additional follow up Details #1::        Phone call completed, Pharmacist called, Called Patient Additional Follow-up by: Alfred Levins, CMA,  February 27, 2010 1:26 PM    Prescriptions: LORAZEPAM 0.5 MG  TABS (LORAZEPAM) 1 two times a day as needed  #60 x 3   Entered by:   Alfred Levins, CMA   Authorized by:   Gordy Savers  MD   Signed by:   Alfred Levins, CMA on 02/27/2010   Method used:   Telephoned to ...       [facility] (retail)       [address]       [place], [place]  [id number]       Ph: [phone] or [phone]       Fax: [phone]   RxID:   [id number]  #60 RF 3

## 2010-05-29 NOTE — Letter (Signed)
Summary: [facility]  [facility]   Imported By: Maryln Gottron 09/26/2009 14:57:42  _____________________________________________________________________  External Attachment:    Type:   Image     Comment:   External Document

## 2010-05-29 NOTE — Assessment & Plan Note (Signed)
Summary: SEVERE LEG PAIN//ALP   Vital Signs:  Patient profile:   77 year old female Pulse rate:   60 / minute Pulse rhythm:   regular Resp:     12 per minute BP sitting:   148 / 62  (left arm) Cuff size:   regular  Vitals Entered By: Gladis Riffle, RN (September 27, 2009 10:09 AM) CC: c/o left leg weakness since yesterday afternoon, states unable to pick it up very well Is Patient Diabetic? No Comments completed prednisone taper this AM from dr Charlett Blake   History of Present Illness: Here with her sister and daughter for the onset yesterday of weakness in both legs. She has had some low back pain and pain radiating into the legs for several weeks. She has seen Dr. Amador Cunas for this, and she saw Dr. Charlett Blake about it on 09-20-09. Dr. Charlett Blake felt her hips wereokay but that the problem was in her spine. Then yesterday she had the onset of weakness in both legs which comes and goes. It makes it hard to walk at times. She also had an episode of urinary incontinence yesterday which is unsual for her. BMs are normal. No other neurologic deficits (speech, upper extremities, etc. ). She recently had normal labs and a normal abdominal US.   Preventive Screening-Counseling & Management  Alcohol-Tobacco     Smoking Status: never  Current Medications (verified): 1)  Toprol Xl 50 Mg  Tb24 (Metoprolol Succinate) .... Once Daily 2)  Welchol 625 Mg  Tabs (Colesevelam Hcl) .... 6 Once Daily 3)  Lorazepam 0.5 Mg  Tabs (Lorazepam) .Marland Kitchen.. 1 Two Times A Day As Needed 4)  Niacin Cr 500 Mg  Cpcr (Niacin) .... One Daily 5)  Aspirin Buffered 325 Mg Tabs (Aspirin Buff(Mgcarb-Alaminoac)) .... One Daily 6)  Dipyridamole 75 Mg Tabs (Dipyridamole) .... Take 1 Tablet By Mouth Two Times A Day 7)  Flax Seed Oil 1000 Mg Caps (Flaxseed (Linseed)) .... Take 1 Capsule By Mouth Once A Day 8)  Fish Oil 1000 Mg Caps (Omega-3 Fatty Acids) .... Take 1 Capsule By Mouth Once A Day 9)  Centrum Silver  Tabs (Multiple Vitamins-Minerals) ....  Take 1 Tablet By Mouth Once A Day 10)  Calcium Carbonate-Vitamin D 600-400 Mg-Unit  Tabs (Calcium Carbonate-Vitamin D) .... Take 1 Tablet By Mouth Once A Day 11)  Vitamin C 500 Mg  Tabs (Ascorbic Acid) .... Take 1 Tablet By Mouth Once A Day  Allergies: 1)  ! Penicillin G Potassium (Penicillin G Potassium) 2)  ! * Satatins Drugs 3)  ! * Oxycodone 4)  ! * Hyrdocodone  Past History:  Past Medical History: Reviewed history from 12/07/2008 and no changes required. Current Problems:  TRANSIENT ISCHEMIC ATTACK (ICD-435.9) HYPERLIPIDEMIA (ICD-272.4) HYPERTENSION (ICD-401.9) CORONARY ARTERY DISEASE (ICD-414.00) URI (ICD-465.9) FATIGUE (ICD-780.79) MIGRAINE WITH AURA (ICD-346.00) OSTEOARTHRITIS (ICD-715.90) ENCOUNTER FOR REMOVAL OF SUTURES (ICD-V58.32) BRONCHITIS, VIRAL (ICD-466.0) DIVERTICULITIS, HX OF (ICD-V12.79) GERD (ICD-530.81) IBS (ICD-564.1)  Past Surgical History: Reviewed history from 11/25/2006 and no changes required. Coronary artery bypass graft Hysterectomy  Review of Systems  The patient denies anorexia, fever, weight loss, weight gain, vision loss, decreased hearing, hoarseness, chest pain, syncope, dyspnea on exertion, peripheral edema, prolonged cough, headaches, hemoptysis, abdominal pain, melena, hematochezia, severe indigestion/heartburn, hematuria, incontinence, genital sores, muscle weakness, suspicious skin lesions, transient blindness, depression, unusual weight change, abnormal bleeding, enlarged lymph nodes, angioedema, breast masses, and testicular masses.    Physical Exam  General:  alert, walks with assistance, legs appear weak Eyes:  No  corneal or conjunctival inflammation noted. EOMI. Perrla. Funduscopic exam benign, without hemorrhages, exudates or papilledema. Vision grossly normal. Neck:  No deformities, masses, or tenderness noted. Lungs:  Normal respiratory effort, chest expands symmetrically. Lungs are clear to auscultation, no crackles or  wheezes. Heart:  Normal rate and regular rhythm. S1 and S2 normal without gallop, murmur, click, rub or other extra sounds. Msk:  tender in the lower back, full ROM Neurologic:  alert & oriented X3 and cranial nerves II-XII intact.  Motor intact in the arms but is 3 out of 4 in both legs. Speech normal   Impression & Recommendations:  Problem # 1:  BACK PAIN, LUMBAR (ICD-724.2)  Her updated medication list for this problem includes:    Aspirin Buffered 325 Mg Tabs (Aspirin buff(mgcarb-alaminoac)) ..... One daily  Problem # 2:  MUSCLE WEAKNESS (GENERALIZED) (ICD-728.87)  Complete Medication List: 1)  Toprol Xl 50 Mg Tb24 (Metoprolol succinate) .... Once daily 2)  Welchol 625 Mg Tabs (Colesevelam hcl) .... 6 once daily 3)  Lorazepam 0.5 Mg Tabs (Lorazepam) .Marland Kitchen.. 1 two times a day as needed 4)  Niacin Cr 500 Mg Cpcr (Niacin) .... One daily 5)  Aspirin Buffered 325 Mg Tabs (Aspirin buff(mgcarb-alaminoac)) .... One daily 6)  Dipyridamole 75 Mg Tabs (Dipyridamole) .... Take 1 tablet by mouth two times a day 7)  Flax Seed Oil 1000 Mg Caps (Flaxseed (linseed)) .... Take 1 capsule by mouth once a day 8)  Fish Oil 1000 Mg Caps (Omega-3 fatty acids) .... Take 1 capsule by mouth once a day 9)  Centrum Silver Tabs (Multiple vitamins-minerals) .... Take 1 tablet by mouth once a day 10)  Calcium Carbonate-vitamin D 600-400 Mg-unit Tabs (Calcium carbonate-vitamin d) .... Take 1 tablet by mouth once a day 11)  Vitamin C 500 Mg Tabs (Ascorbic acid) .... Take 1 tablet by mouth once a day  Other Orders: Misc. Referral (Misc. Ref)  Patient Instructions: 1)  This is consistent with compromise of lumbar nerves, from a mass or a herniated disc. Will set up a lumbar MRI soon to assess.

## 2010-05-29 NOTE — Progress Notes (Signed)
Summary: Omeprazole questions.  Phone Note Call from Patient   Caller: Patient Call For: Marissa Savers  MD Summary of Call: Needs replacement of Nexium......cannot afford it.  Told Dr. Leone Payor, and he suggested Omeprazole, but she is afraid to stay on it  indefinately.  Advised that is fine to continue it if Dr. Leone Payor wants her on it. Initial call taken by: Lynann Beaver CMA,  November 13, 2009 11:06 AM  Follow-up for Phone Call        agree Follow-up by: Marissa Savers  MD,  November 13, 2009 12:31 PM

## 2010-05-29 NOTE — Miscellaneous (Signed)
Summary: Physician's Orders/[facility]  Physician's Orders/[facility]   Imported By: Maryln Gottron 11/01/2009 11:06:45  _____________________________________________________________________  External Attachment:    Type:   Image     Comment:   External Document

## 2010-05-29 NOTE — Miscellaneous (Signed)
Summary: Physician's Orders/[facility]  Physician's Orders/[facility]   Imported By: Maryln Gottron 12/18/2009 15:50:54  _____________________________________________________________________  External Attachment:    Type:   Image     Comment:   External Document

## 2010-06-05 ENCOUNTER — Other Ambulatory Visit: Payer: Self-pay | Admitting: Internal Medicine

## 2010-06-05 DIAGNOSIS — F419 Anxiety disorder, unspecified: Secondary | ICD-10-CM

## 2010-06-05 MED ORDER — LORAZEPAM 0.5 MG PO TABS: 0.5000 mg | ORAL_TABLET | Freq: Two times a day (BID) | ORAL | Status: DC | PRN

## 2010-06-05 NOTE — Telephone Encounter (Signed)
Faxed back to [facility] kik

## 2010-07-06 ENCOUNTER — Encounter: Payer: Self-pay | Admitting: Cardiology

## 2010-07-06 ENCOUNTER — Ambulatory Visit (INDEPENDENT_AMBULATORY_CARE_PROVIDER_SITE_OTHER): Payer: Medicare Other | Admitting: Cardiology

## 2010-07-06 DIAGNOSIS — E7800 Pure hypercholesterolemia, unspecified: Secondary | ICD-10-CM

## 2010-07-06 DIAGNOSIS — G459 Transient cerebral ischemic attack, unspecified: Secondary | ICD-10-CM

## 2010-07-06 DIAGNOSIS — I2510 Atherosclerotic heart disease of native coronary artery without angina pectoris: Secondary | ICD-10-CM

## 2010-07-11 ENCOUNTER — Encounter: Payer: Self-pay | Admitting: Cardiology

## 2010-07-11 ENCOUNTER — Other Ambulatory Visit: Payer: Self-pay | Admitting: Cardiology

## 2010-07-11 ENCOUNTER — Other Ambulatory Visit (INDEPENDENT_AMBULATORY_CARE_PROVIDER_SITE_OTHER): Payer: Medicare Other

## 2010-07-11 DIAGNOSIS — I429 Cardiomyopathy, unspecified: Secondary | ICD-10-CM

## 2010-07-11 DIAGNOSIS — I10 Essential (primary) hypertension: Secondary | ICD-10-CM

## 2010-07-11 LAB — HEPATIC FUNCTION PANEL
ALT: 14 U/L (ref 0–35)
AST: 20 U/L (ref 0–37)
Albumin: 3.9 g/dL (ref 3.5–5.2)
Alkaline Phosphatase: 87 U/L (ref 39–117)

## 2010-07-11 LAB — LIPID PANEL
HDL: 57.5 mg/dL (ref >39.00)
LDL Cholesterol: 108 mg/dL — ABNORMAL HIGH (ref 0–99)
Total CHOL/HDL Ratio: 3
VLDL: 26 mg/dL (ref 0.0–40.0)

## 2010-07-15 LAB — BASIC METABOLIC PANEL
BUN: 17 mg/dL (ref 6–23)
CO2: 28 mEq/L (ref 19–32)
Calcium: 8.5 mg/dL (ref 8.4–10.5)
Chloride: 106 mEq/L (ref 96–112)
Glucose, Bld: 96 mg/dL (ref 70–99)

## 2010-07-15 LAB — URINALYSIS, MICROSCOPIC ONLY
Hgb urine dipstick: NEGATIVE
Nitrite: NEGATIVE
Specific Gravity, Urine: 1.012 (ref 1.005–1.030)
Urobilinogen, UA: 0.2 mg/dL (ref 0.0–1.0)

## 2010-07-15 LAB — CBC
HCT: 29.8 % — ABNORMAL LOW (ref 36.0–46.0)
Hemoglobin: 10 g/dL — ABNORMAL LOW (ref 12.0–15.0)
Hemoglobin: 10.7 g/dL — ABNORMAL LOW (ref 12.0–15.0)
MCV: 90.7 fL (ref 78.0–100.0)
Platelets: 144 10*3/uL — ABNORMAL LOW (ref 150–400)
Platelets: 148 10*3/uL — ABNORMAL LOW (ref 150–400)
RDW: 14.2 % (ref 11.5–15.5)
WBC: 8.4 10*3/uL (ref 4.0–10.5)

## 2010-07-15 LAB — URINE CULTURE

## 2010-07-16 LAB — CBC
HCT: 33.5 % — ABNORMAL LOW (ref 36.0–46.0)
HCT: 34.8 % — ABNORMAL LOW (ref 36.0–46.0)
HCT: 40.2 % (ref 36.0–46.0)
Hemoglobin: 10.4 g/dL — ABNORMAL LOW (ref 12.0–15.0)
Hemoglobin: 11.2 g/dL — ABNORMAL LOW (ref 12.0–15.0)
Hemoglobin: 13.6 g/dL (ref 12.0–15.0)
MCHC: 33.2 g/dL (ref 30.0–36.0)
MCHC: 33.5 g/dL (ref 30.0–36.0)
MCHC: 33.8 g/dL (ref 30.0–36.0)
MCV: 92.6 fL (ref 78.0–100.0)
MCV: 92.7 fL (ref 78.0–100.0)
MCV: 93 fL (ref 78.0–100.0)
MCV: 93.5 fL (ref 78.0–100.0)
Platelets: 170 10*3/uL (ref 150–400)
Platelets: 176 10*3/uL (ref 150–400)
Platelets: 179 10*3/uL (ref 150–400)
Platelets: 195 10*3/uL (ref 150–400)
Platelets: 211 10*3/uL (ref 150–400)
RBC: 3.33 MIL/uL — ABNORMAL LOW (ref 3.87–5.11)
RBC: 3.73 MIL/uL — ABNORMAL LOW (ref 3.87–5.11)
RBC: 4.06 MIL/uL (ref 3.87–5.11)
RDW: 13 % (ref 11.5–15.5)
RDW: 13.2 % (ref 11.5–15.5)
WBC: 10.2 10*3/uL (ref 4.0–10.5)
WBC: 11.7 10*3/uL — ABNORMAL HIGH (ref 4.0–10.5)
WBC: 6.5 10*3/uL (ref 4.0–10.5)
WBC: 7.4 10*3/uL (ref 4.0–10.5)
WBC: 9.6 10*3/uL (ref 4.0–10.5)

## 2010-07-16 LAB — URINE MICROSCOPIC-ADD ON

## 2010-07-16 LAB — COMPREHENSIVE METABOLIC PANEL
ALT: 14 U/L (ref 0–35)
AST: 15 U/L (ref 0–37)
Albumin: 3.2 g/dL — ABNORMAL LOW (ref 3.5–5.2)
Albumin: 3.5 g/dL (ref 3.5–5.2)
Alkaline Phosphatase: 48 U/L (ref 39–117)
BUN: 48 mg/dL — ABNORMAL HIGH (ref 6–23)
CO2: 27 mEq/L (ref 19–32)
CO2: 28 mEq/L (ref 19–32)
Calcium: 9.5 mg/dL (ref 8.4–10.5)
Chloride: 106 mEq/L (ref 96–112)
Chloride: 106 mEq/L (ref 96–112)
Creatinine, Ser: 0.56 mg/dL (ref 0.4–1.2)
GFR calc Af Amer: >60 mL/min (ref >60)
GFR calc non Af Amer: >60 mL/min (ref >60)
GFR calc non Af Amer: >60 mL/min (ref >60)
Potassium: 3.8 mEq/L (ref 3.5–5.1)
Sodium: 139 mEq/L (ref 135–145)
Total Bilirubin: 0.6 mg/dL (ref 0.3–1.2)
Total Bilirubin: 0.8 mg/dL (ref 0.3–1.2)

## 2010-07-16 LAB — URINALYSIS, ROUTINE W REFLEX MICROSCOPIC
Bilirubin Urine: NEGATIVE
Glucose, UA: NEGATIVE mg/dL
Ketones, ur: NEGATIVE mg/dL
Nitrite: NEGATIVE
Specific Gravity, Urine: 1.013 (ref 1.005–1.030)
pH: 5.5 (ref 5.0–8.0)

## 2010-07-16 LAB — CK TOTAL AND CKMB (NOT AT ARMC)
CK, MB: 2.1 ng/mL (ref 0.3–4.0)
Relative Index: INVALID (ref 0.0–2.5)
Relative Index: INVALID (ref 0.0–2.5)
Total CK: 25 U/L (ref 7–177)
Total CK: 41 U/L (ref 7–177)

## 2010-07-16 LAB — BASIC METABOLIC PANEL
Calcium: 9.4 mg/dL (ref 8.4–10.5)
GFR calc Af Amer: >60 mL/min (ref >60)
GFR calc non Af Amer: >60 mL/min (ref >60)
Potassium: 3.8 mEq/L (ref 3.5–5.1)
Sodium: 141 mEq/L (ref 135–145)

## 2010-07-16 LAB — LIPID PANEL
Cholesterol: 149 mg/dL (ref 0–200)
HDL: 53 mg/dL (ref >39)
LDL Cholesterol: 76 mg/dL (ref 0–99)
Total CHOL/HDL Ratio: 2.8 RATIO
Triglycerides: 99 mg/dL (ref <150)

## 2010-07-16 LAB — SAMPLE TO BLOOD BANK

## 2010-07-16 LAB — CROSSMATCH

## 2010-07-16 LAB — HOMOCYSTEINE: Homocysteine: 7.7 umol/L (ref 4.0–15.4)

## 2010-07-16 LAB — DIFFERENTIAL
Basophils Absolute: 0.1 10*3/uL (ref 0.0–0.1)
Eosinophils Relative: 2 % (ref 0–5)
Lymphocytes Relative: 11 % — ABNORMAL LOW (ref 12–46)
Lymphocytes Relative: 20 % (ref 12–46)
Lymphs Abs: 1.1 10*3/uL (ref 0.7–4.0)
Lymphs Abs: 1.5 10*3/uL (ref 0.7–4.0)
Monocytes Absolute: 0.8 10*3/uL (ref 0.1–1.0)
Neutro Abs: 4.9 10*3/uL (ref 1.7–7.7)
Neutro Abs: 8.5 10*3/uL — ABNORMAL HIGH (ref 1.7–7.7)

## 2010-07-16 LAB — ABO/RH: ABO/RH(D): O NEG

## 2010-07-16 LAB — PROTIME-INR: Prothrombin Time: 13.9 seconds (ref 11.6–15.2)

## 2010-07-16 LAB — APTT
aPTT: 24 seconds (ref 24–37)
aPTT: 29 seconds (ref 24–37)

## 2010-07-16 LAB — URINE CULTURE: Colony Count: 85000

## 2010-07-16 LAB — TROPONIN I: Troponin I: 0.03 ng/mL (ref 0.00–0.06)

## 2010-07-26 NOTE — Assessment & Plan Note (Signed)
Summary: follow up    Visit Type:  Follow-up Primary Provider:  Gordy Savers, MD  CC:  pt has no complaints today.Marland Kitchen  History of Present Illness: Thinks she is doing well overall.  Has a problem with walking, probably a result of her stroke.  She falls o n occasion.   No chest apin, and no significant shortness of breath.   Problems Prior to Update: 1)  Palpitations, Hx of  (ICD-V12.50) 2)  Acute Posthemorrhagic Anemia  (ICD-285.1) 3)  Acut Gastr Ulcer W/hemorr w/o Mention Obst  (ICD-531.00) 4)  Muscle Weakness (GENERALIZED)  (ICD-728.87) 5)  Back Pain, Lumbar  (ICD-724.2) 6)  Weight Loss, Recent  (ICD-783.21) 7)  Palpitations  (ICD-785.1) 8)  Transient Ischemic Attack  (ICD-435.9) 9)  Hyperlipidemia  (ICD-272.4) 10)  Hypertension  (ICD-401.9) 11)  Coronary Artery Disease  (ICD-414.00) 12)  Uri  (ICD-465.9) 13)  Fatigue  (ICD-780.79) 14)  Physical Examination  (ICD-V70.0) 15)  Migraine With Aura  (ICD-346.00) 16)  Osteoarthritis  (ICD-715.90) 17)  Encounter For Removal of Sutures  (ICD-V58.32) 18)  Bronchitis, Viral  (ICD-466.0) 19)  Diverticulitis, Hx of  (ICD-V12.79) 20)  Gerd  (ICD-530.81) 21)  Ibs  (ICD-564.1)  Current Medications (verified): 1)  Toprol Xl 50 Mg  Tb24 (Metoprolol Succinate) .... Once Daily 2)  Welchol 625 Mg  Tabs (Colesevelam Hcl) .... 6 Once Daily 3)  Lorazepam 0.5 Mg  Tabs (Lorazepam) .Marland Kitchen.. 1 Two Times A Day As Needed 4)  Niacin Cr 500 Mg  Cpcr (Niacin) .... One Daily 5)  Aspirin Buffered 325 Mg Tabs (Aspirin Buff(Mgcarb-Alaminoac)) .... One Daily 6)  Flax Seed Oil 1000 Mg Caps (Flaxseed (Linseed)) .... Take 1 Capsule By Mouth Once A Day 7)  Fish Oil 1000 Mg Caps (Omega-3 Fatty Acids) .... Take 1 Capsule By Mouth 2x A Day 8)  Centrum Silver  Tabs (Multiple Vitamins-Minerals) .... Take 1 Tablet By Mouth Once A Day 9)  Calcium Carbonate-Vitamin D 600-400 Mg-Unit  Tabs (Calcium Carbonate-Vitamin D) .... Take 1 Tablet By Mouth Once A Day 10)   Vitamin C 500 Mg  Tabs (Ascorbic Acid) .... Take 1 Tablet By Mouth Once A Day 11)  Plavix 75 Mg Tabs (Clopidogrel Bisulfate) .... Take One By Mouth Once Daily 12)  Lipo-Gel 100-30 Mg-Unit Caps (Alpha Lipoic Acid-Vitamin E) .... Take One By Mouth Once Daily 13)  Tylenol Extra Strength 500 Mg Tabs (Acetaminophen) .Marland Kitchen.. 1-2 By Mouth As Needed Pain 14)  Iron Supplement 325 (65 Fe) Mg Tabs (Ferrous Sulfate) .Marland Kitchen.. 1 By Mouth Two Times A Day 15)  Pantoprazole Sodium 40 Mg Tbec (Pantoprazole Sodium) .... One Daily  Allergies (verified): 1)  ! Penicillin G Potassium (Penicillin G Potassium) 2)  ! * Statins 3)  ! Oxycodone Hcl (Oxycodone Hcl) 4)  ! Advil 5)  ! Prednisone 6)  Hydrocodone  Past History:  Past Medical History: Last updated: 11/22/2009 Current Problems:  TRANSIENT ISCHEMIC ATTACK (ICD-435.9) HYPERLIPIDEMIA (ICD-272.4) HYPERTENSION (ICD-401.9) CORONARY ARTERY DISEASE (ICD-414.00) URI (ICD-465.9) FATIGUE (ICD-780.79) MIGRAINE WITH AURA (ICD-346.00) OSTEOARTHRITIS (ICD-715.90) ENCOUNTER FOR REMOVAL OF SUTURES (ICD-V58.32) BRONCHITIS, VIRAL (ICD-466.0) DIVERTICULITIS, HX OF (ICD-V12.79) GERD (ICD-530.81) IBS (ICD-564.1) gastric ulcer  June 2011 hiatal hernia status post right thalamic stroke June 2011 cerebrovascular disease with extensive small vessel disease changes  Vital Signs:  Patient profile:   77 year old female Height:      68 inches Weight:      152.75 pounds BMI:     23.31 Pulse rate:   58 /  minute Resp:     18 per minute BP sitting:   144 / 70  (left arm) Cuff size:   regular  Vitals Entered By: Celestia Khat, CMA (July 06, 2010 3:53 PM)  Physical Exam  General:  Well developed, well nourished, in no acute distress. Head:  normocephalic and atraumatic Eyes:  PERRLA/EOM intact; conjunctiva and lids normal. Neck:  No carotid bruit Lungs:  Clear bilaterally to auscultation and percussion. Heart:  PMI non displaced. Normal S1 and S2.  No definite  murmur.   Abdomen:  Bowel sounds positive; abdomen soft and non-tender without masses, organomegaly, or hernias noted. No hepatosplenomegaly. Pulses:  pulses normal in all 4 extremities Extremities:  No clubbing or cyanosis. Neurologic:  Alert and oriented x 3.   EKG  Procedure date:  07/06/2010  Findings:      SB.  Nonspecific ST and T abnormality.  Impression & Recommendations:  Problem # 1:  CORONARY ARTERY DISEASE (ICD-414.00)  continues to remain stable at present. Continue medical therapy. Her updated medication list for this problem includes:    Toprol Xl 50 Mg Tb24 (Metoprolol succinate) ..... Once daily    Aspirin Buffered 325 Mg Tabs (Aspirin buff(mgcarb-alaminoac)) ..... One daily    Plavix 75 Mg Tabs (Clopidogrel bisulfate) .Marland Kitchen... Take one by mouth once daily  Orders: EKG w/ Interpretation (93000)  Problem # 2:  TRANSIENT ISCHEMIC ATTACK (ICD-435.9) No recurrence.  Had bleeding ulcer but placed back on DAPT.  Would lean toward ASA 81mg  given history of GI bleed.  Problem # 3:  ACUT GASTR ULCER W/HEMORR W/O MENTION OBST (ICD-531.00) see above Her updated medication list for this problem includes:    Pantoprazole Sodium 40 Mg Tbec (Pantoprazole sodium) ..... One daily  Problem # 4:  HYPERLIPIDEMIA (ICD-272.4)  on medical therapy.  Cannot take statins.  Crestor was easiest, but she was in pain after six weeks of it.  Her updated medication list for this problem includes:    Welchol 625 Mg Tabs (Colesevelam hcl) .Marland KitchenMarland KitchenMarland KitchenMarland Kitchen 6 once daily    Niacin Cr 500 Mg Cpcr (Niacin) ..... One daily  Orders: EKG w/ Interpretation (93000)  Patient Instructions: 1)  Your physician recommends that you continue on your current medications as directed. Please refer to the Current Medication list given to you today. 2)  Your physician wants you to follow-up in: 6 MONTHS.  You will receive a reminder letter in the mail two months in advance. If you don't receive a letter, please call our  office to schedule the follow-up appointment. 3)  Your physician recommends that you return for a FASTING LIPID and LIVER Profile, 07/11/10--Nothing to eat or drink after midnight, lab opens at 8:30

## 2010-08-20 ENCOUNTER — Other Ambulatory Visit: Payer: Self-pay

## 2010-08-20 DIAGNOSIS — F419 Anxiety disorder, unspecified: Secondary | ICD-10-CM

## 2010-08-20 MED ORDER — LORAZEPAM 0.5 MG PO TABS: 0.5000 mg | ORAL_TABLET | Freq: Two times a day (BID) | ORAL | Status: DC | PRN

## 2010-08-20 NOTE — Telephone Encounter (Signed)
Faxed back to [facility] 

## 2010-09-11 NOTE — Assessment & Plan Note (Signed)
[facility] OFFICE NOTE   NAME:Overholt, VERNEAL WIERS                       MRN:          [id number]  DATE:09/05/2006                            DOB:          [birth year]    A 77 year old female seen today for a wellness exam. She has a history  of coronary artery disease, status post CABG in 1996. She has history of  statin intolerance. Her cholesterol is well controlled on WelChol and  niacin. Additionally she has irritable bowel syndrome, gastroesophageal  reflux disease with history of stricture formation. She has  hypertension. She is doing well, is followed by GI, cardiology, and  gynecology.   REVIEW OF SYSTEMS:  Exam is fairly unremarkable. Did have colonoscopy in  2006.   FAMILY HISTORY:  Basically unchanged. Father died at 38 of myocardial  infarction. Mother died at 68. One sister deceased from throat cancer,  another sister has diabetes, coronary artery disease, also positive for  senile dementia of the Alzheimer's type.   Exam revealed an elderly, healthy-appearing female in no acute distress.  Blood pressure is low normal.  SKIN: Negative.  FUNDI, EARS, NOSE, AND THROAT: Clear.  NECK: No bruits.  CHEST: Clear.  CARDIOVASCULAR EXAM: Revealed normal heart sounds. No murmurs or  gallops, well-healed sternotomy scar.  BREAST EXAM: Negative.  ABDOMEN: Mildly overweight, soft, nontender. No organomegaly, masses, or  bruits.  EXTREMITIES: Revealed full peripheral pulses.  NEURO: Negative.   IMPRESSION:  Coronary artery disease, dyslipidemia, hypertension,  gastroesophageal reflux disease. We will reassess in 6 months.     Gordy Savers, MD  Electronically Signed    PFK/MedQ  DD: 09/05/2006  DT: 09/05/2006  Job #: [id number]

## 2010-09-19 ENCOUNTER — Encounter: Payer: Self-pay | Admitting: Internal Medicine

## 2010-09-20 ENCOUNTER — Encounter: Payer: Self-pay | Admitting: Internal Medicine

## 2010-09-20 ENCOUNTER — Ambulatory Visit (INDEPENDENT_AMBULATORY_CARE_PROVIDER_SITE_OTHER): Payer: Medicare Other | Admitting: Internal Medicine

## 2010-09-20 DIAGNOSIS — I2510 Atherosclerotic heart disease of native coronary artery without angina pectoris: Secondary | ICD-10-CM

## 2010-09-20 DIAGNOSIS — K589 Irritable bowel syndrome without diarrhea: Secondary | ICD-10-CM

## 2010-09-20 DIAGNOSIS — I10 Essential (primary) hypertension: Secondary | ICD-10-CM

## 2010-09-20 DIAGNOSIS — E785 Hyperlipidemia, unspecified: Secondary | ICD-10-CM

## 2010-09-20 NOTE — Progress Notes (Signed)
  Subjective:    Patient ID: Marissa Bowen, female    DOB: 1926/11/13, 77 y.o.   MRN: [id number]  HPI  77 year old patient who is seen today for followup. She is status post right thalamic stroke in June of last year;  she complains of chronic fatigue. Her chief complaint is situational stress. She does have a history of IBS and increasing anxiety has aggravated GI complaints. She has coronary artery disease which has been stable. She denies any focal neurological symptoms. She feels that she has been somewhat weak and unsteady since her stroke in June of last year.   Review of Systems  HENT: Negative for hearing loss, congestion, sore throat, rhinorrhea, dental problem, sinus pressure and tinnitus.   Eyes: Negative for pain, discharge and visual disturbance.  Respiratory: Negative for cough and shortness of breath.   Cardiovascular: Negative for chest pain, palpitations and leg swelling.  Gastrointestinal: Negative for nausea, vomiting, abdominal pain, diarrhea, constipation, blood in stool and abdominal distention.  Genitourinary: Negative for dysuria, urgency, frequency, hematuria, flank pain, vaginal bleeding, vaginal discharge, difficulty urinating, vaginal pain and pelvic pain.  Musculoskeletal: Positive for gait problem. Negative for joint swelling and arthralgias.  Skin: Negative for rash.  Neurological: Positive for weakness. Negative for dizziness, syncope, speech difficulty, numbness and headaches.  Hematological: Negative for adenopathy.  Psychiatric/Behavioral: Negative for behavioral problems, dysphoric mood and agitation. The patient is not nervous/anxious.        Objective:   Physical Exam  Constitutional: She is oriented to person, place, and time. She appears well-developed and well-nourished.  HENT:  Head: Normocephalic.  Right Ear: External ear normal.  Left Ear: External ear normal.  Mouth/Throat: Oropharynx is clear and moist.  Eyes: Conjunctivae and EOM are normal.  Pupils are equal, round, and reactive to light.  Neck: Normal range of motion. Neck supple. No thyromegaly present.  Cardiovascular: Normal rate, regular rhythm, normal heart sounds and intact distal pulses.        Diminished left dorsalis pedis pulse  Pulmonary/Chest: Effort normal and breath sounds normal.  Abdominal: Soft. Bowel sounds are normal. She exhibits no mass. There is no tenderness.  Musculoskeletal: Normal range of motion.  Lymphadenopathy:    She has no cervical adenopathy.  Neurological: She is alert and oriented to person, place, and time.  Skin: Skin is warm and dry. No rash noted.  Psychiatric: She has a normal mood and affect. Her behavior is normal.          Assessment & Plan:   Anxiety disorder/situational stress. We'll continue lorazepam when necessary Hypertension stable Cerebral vascular disease stable Coronary artery disease Dyslipidemia. Samples of WelChol dispense

## 2010-09-20 NOTE — Patient Instructions (Signed)
Limit your sodium (Salt) intake    It is important that you exercise regularly, at least 20 minutes 3 to 4 times per week.  If you develop chest pain or shortness of breath seek  medical attention.  Return in 6 months for follow-up  

## 2010-10-04 ENCOUNTER — Telehealth: Payer: Self-pay | Admitting: *Deleted

## 2010-10-04 MED ORDER — LORAZEPAM 0.5 MG PO TABS: 0.5000 mg | ORAL_TABLET | Freq: Three times a day (TID) | ORAL | Status: DC | PRN

## 2010-10-04 NOTE — Telephone Encounter (Signed)
#  50  RF [phone] 

## 2010-10-04 NOTE — Telephone Encounter (Signed)
Refill on lorazepam 0.5mg 

## 2010-10-05 ENCOUNTER — Telehealth: Payer: Self-pay | Admitting: *Deleted

## 2010-10-05 NOTE — Telephone Encounter (Signed)
VM from pt requesting PC from Dr Charm Rings nurse re: a medication that was filled recently [phone]

## 2010-10-08 NOTE — Telephone Encounter (Signed)
Attempt to call - phone ringing busy x3 - will try tomorrow. KIK

## 2010-10-09 NOTE — Telephone Encounter (Signed)
Attempt to call - ?phone problems - still rings busy. KIK

## 2010-11-13 ENCOUNTER — Encounter: Payer: Self-pay | Admitting: Internal Medicine

## 2010-11-27 ENCOUNTER — Other Ambulatory Visit: Payer: Self-pay

## 2010-11-27 MED ORDER — CLOPIDOGREL BISULFATE 75 MG PO TABS: 75.0000 mg | ORAL_TABLET | Freq: Every day | ORAL | Status: DC

## 2010-12-21 ENCOUNTER — Telehealth: Payer: Self-pay | Admitting: Internal Medicine

## 2010-12-21 NOTE — Telephone Encounter (Signed)
ok 

## 2010-12-21 NOTE — Telephone Encounter (Signed)
Pt left message on triage line. Is on Welchol but states it is too expensive to refill right now. Would like some samples. States her husband, Fayrene Fearing, is coming in today to pick up some other samples for himself. She would like him to pick up the [facility] for her as well, if it is possible.

## 2010-12-26 ENCOUNTER — Telehealth: Payer: Self-pay | Admitting: Internal Medicine

## 2010-12-26 NOTE — Telephone Encounter (Signed)
Pt called and said that she had asked for samples of Welchol on the 24th of August and has not rcvd a call back and no samples have been left a [facility] front office. Pt is completely of of med. Pt needs to pick up samples today.

## 2010-12-26 NOTE — Telephone Encounter (Signed)
Pt aware samples ready for pick up 

## 2010-12-26 NOTE — Telephone Encounter (Signed)
Attempt to call - samples avilb. , line rings busy x3 - will try later

## 2011-01-17 ENCOUNTER — Ambulatory Visit (INDEPENDENT_AMBULATORY_CARE_PROVIDER_SITE_OTHER): Payer: Medicare Other | Admitting: Cardiology

## 2011-01-17 ENCOUNTER — Encounter: Payer: Self-pay | Admitting: Cardiology

## 2011-01-17 VITALS — BP 135/68 | HR 66 | Ht 67.0 in | Wt 159.0 lb

## 2011-01-17 DIAGNOSIS — E785 Hyperlipidemia, unspecified: Secondary | ICD-10-CM

## 2011-01-17 DIAGNOSIS — I10 Essential (primary) hypertension: Secondary | ICD-10-CM

## 2011-01-17 DIAGNOSIS — I2510 Atherosclerotic heart disease of native coronary artery without angina pectoris: Secondary | ICD-10-CM

## 2011-01-17 NOTE — Patient Instructions (Signed)
Your physician wants you to follow-up in: 1 YEAR.  You will receive a reminder letter in the mail two months in advance. If you don't receive a letter, please call our office to schedule the follow-up appointment.  Your physician recommends that you continue on your current medications as directed. Please refer to the Current Medication list given to you today.  

## 2011-01-20 NOTE — Assessment & Plan Note (Signed)
Seems controlled at this point.

## 2011-01-20 NOTE — Assessment & Plan Note (Signed)
Stable many years post bypass.  On ASA, primary dose was made for TIA.  Will not change.

## 2011-01-20 NOTE — Progress Notes (Addendum)
HPI:  Marissa Bowen is in for follow up.  She stopped, then restarted her plavix.  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  She is no longer seeing the neurologists, only seeing Dr. Kirtland Bouchard at this point in time.    Current Outpatient Prescriptions  Medication Sig Dispense Refill  . aspirin 325 MG EC tablet Take 325 mg by mouth daily.        . clopidogrel (PLAVIX) 75 MG tablet Take 1 tablet (75 mg total) by mouth daily.  90 tablet  1  . colesevelam (WELCHOL) 625 MG tablet 6 tablets daily       . Flaxseed, Linseed, (FLAXSEED OIL) 1000 MG CAPS Take by mouth daily.        Marland Kitchen LORazepam (ATIVAN) 0.5 MG tablet Take 1 tablet (0.5 mg total) by mouth 3 (three) times daily as needed.  50 tablet  5  . metoprolol (TOPROL-XL) 50 MG 24 hr tablet Take 50 mg by mouth daily.        . Multiple Vitamin (MULTIVITAMIN) tablet Take 1 tablet by mouth daily.        . niacin 500 MG CR capsule Take 500 mg by mouth at bedtime.        . Omega-3 Fatty Acids (FISH OIL) 1000 MG CAPS Take by mouth daily.        . pantoprazole (PROTONIX) 40 MG tablet Take 40 mg by mouth daily.        . vitamin C (ASCORBIC ACID) 500 MG tablet Take 500 mg by mouth daily.          Allergies  Allergen Reactions  . Hydrocodone     REACTION: migraines  . Ibuprofen   . Oxycodone Hcl     REACTION: migraines  . Penicillins   . Prednisone   . Statins     Past Medical History  Diagnosis Date  . Acute posthemorrhagic anemia 10/23/2009  . BACK PAIN, LUMBAR 09/27/2009  . CORONARY ARTERY DISEASE 11/25/2006  . DIVERTICULITIS, HX OF 11/25/2006  . FATIGUE 07/21/2008  . GERD 11/25/2006  . HYPERLIPIDEMIA 11/25/2006  . HYPERTENSION 11/25/2006  . IBS 12/07/2008  . MIGRAINE WITH AURA 08/13/2007  . Muscle weakness (generalized) 09/27/2009  . OSTEOARTHRITIS 08/13/2007  . PALPITATIONS, HX OF 01/04/2010  . TRANSIENT ISCHEMIC ATTACK 04/13/2007  . Hiatal hernia     Past Surgical History  Procedure Date  .  Coronary artery bypass graft   . Abdominal hysterectomy   . Tonsillectomy     No family history on file.  History   Social History  . Marital Status: Married    Spouse Name: N/A    Number of Children: N/A  . Years of Education: N/A   Occupational History  . Not on file.   Social History Main Topics  . Smoking status: Never Smoker   . Smokeless tobacco: Never Used  . Alcohol Use: No  . Drug Use: No  . Sexually Active: Not on file   Other Topics Concern  . Not on file   Social History Narrative  . No narrative on file    ROS: Please see the HPI.  All other systems reviewed and negative.  PHYSICAL EXAM:  BP 135/68  Pulse 66  Ht 5\' 7"  (1.702 m)  Wt 159 lb (72.122 kg)  BMI 24.90 kg/m2  General: Well developed, well nourished, in no acute distress. Head:  Normocephalic and atraumatic. Neck: no JVD Lungs: Clear to auscultation and  percussion. Heart: Normal S1 and S2.  No murmur, rubs or gallops.  Abdomen:  Normal bowel sounds; soft; non tender; no organomegaly Pulses: Pulses normal in all 4 extremities. Extremities: No clubbing or cyanosis. No edema. Neurologic: Alert and oriented x 3.  EKG:  NSR.  Cannot rule out IMI, old.  Non diagnostic inferior Qs.  No acute changes.   ASSESSMENT AND PLAN:

## 2011-01-20 NOTE — Assessment & Plan Note (Signed)
Managed by Dr. Kirtland Bouchard.  Not on statins, but does not tolerate.  Worried about cost of medication with Welchol, but options are obviously limited.  LDL is not too bad on current regimen.

## 2011-01-21 ENCOUNTER — Other Ambulatory Visit: Payer: Self-pay | Admitting: Internal Medicine

## 2011-01-30 ENCOUNTER — Other Ambulatory Visit: Payer: Self-pay | Admitting: Internal Medicine

## 2011-02-05 ENCOUNTER — Ambulatory Visit (INDEPENDENT_AMBULATORY_CARE_PROVIDER_SITE_OTHER): Payer: Medicare Other

## 2011-02-05 DIAGNOSIS — Z23 Encounter for immunization: Secondary | ICD-10-CM

## 2011-02-14 ENCOUNTER — Ambulatory Visit (INDEPENDENT_AMBULATORY_CARE_PROVIDER_SITE_OTHER): Payer: Medicare Other | Admitting: Internal Medicine

## 2011-02-14 ENCOUNTER — Encounter: Payer: Self-pay | Admitting: Internal Medicine

## 2011-02-14 DIAGNOSIS — I2510 Atherosclerotic heart disease of native coronary artery without angina pectoris: Secondary | ICD-10-CM

## 2011-02-14 DIAGNOSIS — M6281 Muscle weakness (generalized): Secondary | ICD-10-CM

## 2011-02-14 DIAGNOSIS — R5381 Other malaise: Secondary | ICD-10-CM

## 2011-02-14 DIAGNOSIS — I10 Essential (primary) hypertension: Secondary | ICD-10-CM

## 2011-02-14 MED ORDER — SERTRALINE HCL 25 MG PO TABS: 25.0000 mg | ORAL_TABLET | Freq: Every day | ORAL | Status: DC

## 2011-02-14 MED ORDER — ZOLPIDEM TARTRATE 5 MG PO TABS: 5.0000 mg | ORAL_TABLET | Freq: Every evening | ORAL | Status: DC | PRN

## 2011-02-14 NOTE — Progress Notes (Signed)
  Subjective:    Patient ID: Marissa Bowen, female    DOB: Jul 22, 1926, 77 y.o.   MRN: [id number]  HPI  77 year old patient who is seen today for followup. Her chief complaint is chronic fatigue. She was seen by me in the spring with a similar complaint. For the past 4 weeks she has had increase in insomnia and situational stress. She states that she is sleeping very poorly. She describes anxiety and excessive worry. Medical problems include hypertension coronary artery disease. There's been no change in weight or fever. Denies any exertional chest pain. No new medications.    Review of Systems  Constitutional: Positive for fatigue.  HENT: Negative for hearing loss, congestion, sore throat, rhinorrhea, dental problem, sinus pressure and tinnitus.   Eyes: Negative for pain, discharge and visual disturbance.  Respiratory: Negative for cough and shortness of breath.   Cardiovascular: Negative for chest pain, palpitations and leg swelling.  Gastrointestinal: Negative for nausea, vomiting, abdominal pain, diarrhea, constipation, blood in stool and abdominal distention.  Genitourinary: Negative for dysuria, urgency, frequency, hematuria, flank pain, vaginal bleeding, vaginal discharge, difficulty urinating, vaginal pain and pelvic pain.  Musculoskeletal: Negative for joint swelling, arthralgias and gait problem.  Skin: Negative for rash.  Neurological: Negative for dizziness, syncope, speech difficulty, weakness, numbness and headaches.  Hematological: Negative for adenopathy.  Psychiatric/Behavioral: Positive for sleep disturbance and dysphoric mood. Negative for behavioral problems and agitation. The patient is nervous/anxious.        Objective:   Physical Exam  Constitutional: She is oriented to person, place, and time. She appears well-developed and well-nourished.  HENT:  Head: Normocephalic.  Right Ear: External ear normal.  Left Ear: External ear normal.  Mouth/Throat: Oropharynx is  clear and moist.  Eyes: Conjunctivae and EOM are normal. Pupils are equal, round, and reactive to light.  Neck: Normal range of motion. Neck supple. No thyromegaly present.  Cardiovascular: Normal rate, regular rhythm and normal heart sounds.   Pulmonary/Chest: Effort normal and breath sounds normal.  Abdominal: Soft. Bowel sounds are normal. She exhibits no mass. There is no tenderness.  Musculoskeletal: Normal range of motion.  Lymphadenopathy:    She has no cervical adenopathy.  Neurological: She is alert and oriented to person, place, and time.  Skin: Skin is warm and dry. No rash noted.  Psychiatric: She has a normal mood and affect. Her behavior is normal.          Assessment & Plan:  Fatigue Insomnia Situational stress Coronary artery disease stable  We'll give a trial of sertraline to take every morning. We'll also treat with short term hypnotic stenosis with sleep. We'll recheck in 6 weeks We'll check a laboratory screen to include hematocrit sedimentation rate and electrolytes as well as TSH

## 2011-02-14 NOTE — Patient Instructions (Signed)
Return office visit 6 weeks

## 2011-02-15 ENCOUNTER — Telehealth: Payer: Self-pay | Admitting: *Deleted

## 2011-02-15 LAB — CBC WITH DIFFERENTIAL/PLATELET
Basophils Absolute: 0.1 10*3/uL (ref 0.0–0.1)
HCT: 39.1 % (ref 36.0–46.0)
Lymphs Abs: 1.4 10*3/uL (ref 0.7–4.0)
MCV: 92.6 fl (ref 78.0–100.0)
Monocytes Absolute: 0.5 10*3/uL (ref 0.1–1.0)
Monocytes Relative: 8.7 % (ref 3.0–12.0)
Platelets: 197 10*3/uL (ref 150.0–400.0)
RDW: 13.1 % (ref 11.5–14.6)

## 2011-02-15 LAB — COMPREHENSIVE METABOLIC PANEL
Albumin: 4 g/dL (ref 3.5–5.2)
Alkaline Phosphatase: 79 U/L (ref 39–117)
CO2: 28 mEq/L (ref 19–32)
Chloride: 103 mEq/L (ref 96–112)
Glucose, Bld: 118 mg/dL — ABNORMAL HIGH (ref 70–99)
Potassium: 4.1 mEq/L (ref 3.5–5.1)
Sodium: 140 mEq/L (ref 135–145)
Total Protein: 7.1 g/dL (ref 6.0–8.3)

## 2011-02-15 LAB — SEDIMENTATION RATE: Sed Rate: 13 mm/hr (ref 0–22)

## 2011-02-15 NOTE — Telephone Encounter (Signed)
Pt is requesting to talk to Marissa Bowen about her discharge papers yesterday.

## 2011-02-15 NOTE — Telephone Encounter (Signed)
Spoke with pt- had questions about dx noted on avs

## 2011-02-20 ENCOUNTER — Telehealth: Payer: Self-pay | Admitting: *Deleted

## 2011-02-20 NOTE — Telephone Encounter (Signed)
Requesting lab results

## 2011-02-20 NOTE — Telephone Encounter (Signed)
Spoke with pt - informed labs normal

## 2011-02-25 ENCOUNTER — Telehealth: Payer: Self-pay | Admitting: Internal Medicine

## 2011-02-25 NOTE — Telephone Encounter (Signed)
Refill Lorazepam to [facility]. Thanks.

## 2011-02-26 MED ORDER — LORAZEPAM 0.5 MG PO TABS: 0.5000 mg | ORAL_TABLET | Freq: Three times a day (TID) | ORAL | Status: DC

## 2011-02-26 NOTE — Telephone Encounter (Signed)
Patient states she is completely out of rx and would like a refill today if possible

## 2011-02-26 NOTE — Telephone Encounter (Signed)
Okay to give one week of medication

## 2011-03-05 ENCOUNTER — Other Ambulatory Visit: Payer: Self-pay

## 2011-03-05 MED ORDER — LORAZEPAM 0.5 MG PO TABS: 0.5000 mg | ORAL_TABLET | Freq: Three times a day (TID) | ORAL | Status: DC | PRN

## 2011-03-05 NOTE — Telephone Encounter (Signed)
Called into [facility]

## 2011-03-25 ENCOUNTER — Encounter: Payer: Medicare Other | Admitting: Internal Medicine

## 2011-04-30 LAB — HM DEXA SCAN

## 2011-05-03 ENCOUNTER — Other Ambulatory Visit: Payer: Self-pay | Admitting: Internal Medicine

## 2011-05-03 NOTE — Telephone Encounter (Signed)
Okay per dr Kirtland Bouchard

## 2011-05-09 ENCOUNTER — Other Ambulatory Visit: Payer: Self-pay | Admitting: Internal Medicine

## 2011-05-17 ENCOUNTER — Encounter: Payer: Medicare Other | Admitting: Internal Medicine

## 2011-05-28 DIAGNOSIS — N8182 Incompetence or weakening of pubocervical tissue: Secondary | ICD-10-CM | POA: Diagnosis not present

## 2011-05-28 DIAGNOSIS — Z124 Encounter for screening for malignant neoplasm of cervix: Secondary | ICD-10-CM | POA: Diagnosis not present

## 2011-05-31 ENCOUNTER — Other Ambulatory Visit: Payer: Self-pay | Admitting: Internal Medicine

## 2011-06-03 ENCOUNTER — Other Ambulatory Visit: Payer: Self-pay | Admitting: Cardiology

## 2011-06-03 DIAGNOSIS — I6529 Occlusion and stenosis of unspecified carotid artery: Secondary | ICD-10-CM

## 2011-06-04 ENCOUNTER — Other Ambulatory Visit: Payer: Self-pay | Admitting: Internal Medicine

## 2011-06-05 ENCOUNTER — Encounter (INDEPENDENT_AMBULATORY_CARE_PROVIDER_SITE_OTHER): Payer: Medicare Other | Admitting: Cardiology

## 2011-06-05 DIAGNOSIS — I6529 Occlusion and stenosis of unspecified carotid artery: Secondary | ICD-10-CM

## 2011-06-05 NOTE — Telephone Encounter (Signed)
Last OV and fill date 02/14/11

## 2011-06-05 NOTE — Telephone Encounter (Signed)
Pt is aware MD is out of office until Thursday. Pt has enough med to last until tomorrow morning

## 2011-06-06 NOTE — Telephone Encounter (Signed)
ok and in

## 2011-06-10 DIAGNOSIS — R29890 Loss of height: Secondary | ICD-10-CM | POA: Diagnosis not present

## 2011-06-10 DIAGNOSIS — E8941 Symptomatic postprocedural ovarian failure: Secondary | ICD-10-CM | POA: Diagnosis not present

## 2011-06-14 ENCOUNTER — Encounter: Payer: Self-pay | Admitting: Internal Medicine

## 2011-06-14 ENCOUNTER — Ambulatory Visit (INDEPENDENT_AMBULATORY_CARE_PROVIDER_SITE_OTHER): Payer: Medicare Other | Admitting: Internal Medicine

## 2011-06-14 DIAGNOSIS — E785 Hyperlipidemia, unspecified: Secondary | ICD-10-CM

## 2011-06-14 DIAGNOSIS — I2510 Atherosclerotic heart disease of native coronary artery without angina pectoris: Secondary | ICD-10-CM

## 2011-06-14 DIAGNOSIS — K219 Gastro-esophageal reflux disease without esophagitis: Secondary | ICD-10-CM | POA: Diagnosis not present

## 2011-06-14 DIAGNOSIS — I10 Essential (primary) hypertension: Secondary | ICD-10-CM | POA: Diagnosis not present

## 2011-06-14 DIAGNOSIS — Z0000 Encounter for general adult medical examination without abnormal findings: Secondary | ICD-10-CM

## 2011-06-14 LAB — LIPID PANEL
LDL Cholesterol: 93 mg/dL (ref 0–99)
Total CHOL/HDL Ratio: 3
VLDL: 20 mg/dL (ref 0.0–40.0)

## 2011-06-14 MED ORDER — CLOPIDOGREL BISULFATE 75 MG PO TABS: 75.0000 mg | ORAL_TABLET | Freq: Every day | ORAL | Status: DC

## 2011-06-14 MED ORDER — COLESEVELAM HCL 625 MG PO TABS: 1875.0000 mg | ORAL_TABLET | Freq: Two times a day (BID) | ORAL | Status: DC

## 2011-06-14 MED ORDER — PANTOPRAZOLE SODIUM 40 MG PO TBEC: 40.0000 mg | DELAYED_RELEASE_TABLET | Freq: Every day | ORAL | Status: DC

## 2011-06-14 MED ORDER — ASPIRIN 81 MG PO TABS: 81.0000 mg | ORAL_TABLET | Freq: Every day | ORAL | Status: AC

## 2011-06-14 MED ORDER — LORAZEPAM 0.5 MG PO TABS: 0.5000 mg | ORAL_TABLET | Freq: Three times a day (TID) | ORAL | Status: DC | PRN

## 2011-06-14 NOTE — Progress Notes (Signed)
Subjective:    Patient ID: Marissa Bowen, female    DOB: [birth year], 78 y.o.   MRN: [id number]  HPI  78 year old patient who is seen today for a wellness exam.  She has a history of coronary artery disease and is status post CABG. She is followed by cardiology. She also has a history of prior TIA which has been stable. She has a history of statin intolerance. She is doing quite well today without any major concerns or complaints.  Past Medical History  Diagnosis Date  . Acute posthemorrhagic anemia 10/23/2009  . BACK PAIN, LUMBAR 09/27/2009  . CORONARY ARTERY DISEASE 11/25/2006  . DIVERTICULITIS, HX OF 11/25/2006  . FATIGUE 07/21/2008  . GERD 11/25/2006  . HYPERLIPIDEMIA 11/25/2006  . HYPERTENSION 11/25/2006  . IBS 12/07/2008  . MIGRAINE WITH AURA 08/13/2007  . Muscle weakness (generalized) 09/27/2009  . OSTEOARTHRITIS 08/13/2007  . PALPITATIONS, HX OF 01/04/2010  . TRANSIENT ISCHEMIC ATTACK 04/13/2007  . Hiatal hernia     History   Social History  . Marital Status: Married    Spouse Name: N/A    Number of Children: N/A  . Years of Education: N/A   Occupational History  . Not on file.   Social History Main Topics  . Smoking status: Never Smoker   . Smokeless tobacco: Never Used  . Alcohol Use: No  . Drug Use: No  . Sexually Active: Not on file   Other Topics Concern  . Not on file   Social History Narrative  . No narrative on file    Past Surgical History  Procedure Date  . Coronary artery bypass graft   . Abdominal hysterectomy   . Tonsillectomy     No family history on file.  Allergies  Allergen Reactions  . Hydrocodone     REACTION: migraines  . Ibuprofen   . Oxycodone Hcl     REACTION: migraines  . Penicillins   . Prednisone   . Statins     Current Outpatient Prescriptions on File Prior to Visit  Medication Sig Dispense Refill  . aspirin 325 MG EC tablet Take 325 mg by mouth daily.        . clopidogrel (PLAVIX) 75 MG tablet TAKE ONE TABLET EACH DAY  90  tablet  1  . Flaxseed, Linseed, (FLAXSEED OIL) 1000 MG CAPS Take by mouth daily.        Marland Kitchen LORazepam (ATIVAN) 0.5 MG tablet TAKE ONE TABLET THREE                   TIMES DAILY  90 tablet  3  . metoprolol (TOPROL-XL) 50 MG 24 hr tablet TAKE ONE TABLET EVERY DAY  90 tablet  1  . Multiple Vitamin (MULTIVITAMIN) tablet Take 1 tablet by mouth daily.        . niacin 500 MG CR capsule Take 500 mg by mouth at bedtime.        . Omega-3 Fatty Acids (FISH OIL) 1000 MG CAPS Take by mouth daily.        . pantoprazole (PROTONIX) 40 MG tablet TAKE ONE TABLET EVERY DAY  90 tablet  3  . sertraline (ZOLOFT) 25 MG tablet Take 1 tablet (25 mg total) by mouth daily.  30 tablet  2  . vitamin C (ASCORBIC ACID) 500 MG tablet Take 500 mg by mouth daily.        Lilian Kapur 625 MG tablet TAKE 6 TABLETS DAILY  360 tablet  1  .  zolpidem (AMBIEN) 5 MG tablet Take 1 tablet (5 mg total) by mouth at bedtime as needed for sleep.  30 tablet  0    BP 120/80  Pulse 58  Temp(Src) 97.8 F (36.6 C) (Oral)  Resp 16  Ht 5\' 7"  (1.702 m)  Wt 161 lb (73.029 kg)  BMI 25.22 kg/m2  SpO2 98%  1. Risk factors, based on past  M,S,F history patient has known coronary artery disease cardiac risk factors include hypertension and dyslipidemia.  2.  Physical activities: Remains quite active without major exercise limitations  3.  Depression/mood: No history depression or mood disorder she does have situational stress and does use daily anxiolytics  4.  Hearing: No significant impairment  5.  ADL's: Independent in all aspects of daily living  6.  Fall risk: Low  7.  Home safety: No problems identified  8.  Height weight, and visual acuity; height and weight stable no change in visual acuity  9.  Counseling: Heart healthy diet regular exercise all encouraged  10. Lab orders based on risk factors: Laboratory studies from October were reviewed we'll followup a fasting lipid panel today  11. Referral : Followup cardiology and  GYN  12. Care plan: Continue heart healthy diet and regular exercise  13. Cognitive assessment: Alert and oriented with normal affect. No cognitive dysfunction         Review of Systems  Constitutional: Negative for fever, appetite change, fatigue and unexpected weight change.  HENT: Negative for hearing loss, ear pain, nosebleeds, congestion, sore throat, mouth sores, trouble swallowing, neck stiffness, dental problem, voice change, sinus pressure and tinnitus.   Eyes: Negative for photophobia, pain, redness and visual disturbance.  Respiratory: Negative for cough, chest tightness and shortness of breath.   Cardiovascular: Negative for chest pain, palpitations and leg swelling.  Gastrointestinal: Negative for nausea, vomiting, abdominal pain, diarrhea, constipation, blood in stool, abdominal distention and rectal pain.  Genitourinary: Negative for dysuria, urgency, frequency, hematuria, flank pain, vaginal bleeding, vaginal discharge, difficulty urinating, genital sores, vaginal pain, menstrual problem and pelvic pain.  Musculoskeletal: Negative for back pain and arthralgias.  Skin: Negative for rash.  Neurological: Negative for dizziness, syncope, speech difficulty, weakness, light-headedness, numbness and headaches.  Hematological: Negative for adenopathy. Does not bruise/bleed easily.  Psychiatric/Behavioral: Negative for suicidal ideas, behavioral problems, self-injury, dysphoric mood and agitation. The patient is not nervous/anxious.        Objective:   Physical Exam  Constitutional: She is oriented to person, place, and time. She appears well-developed and well-nourished.  HENT:  Head: Normocephalic and atraumatic.  Right Ear: External ear normal.  Left Ear: External ear normal.  Mouth/Throat: Oropharynx is clear and moist.  Eyes: Conjunctivae and EOM are normal.  Neck: Normal range of motion. Neck supple. No JVD present. No thyromegaly present.  Cardiovascular: Normal  rate, regular rhythm, normal heart sounds and intact distal pulses.   No murmur heard. Pulmonary/Chest: Effort normal and breath sounds normal. She has no wheezes. She has no rales.       Sternotomy scar  Abdominal: Soft. Bowel sounds are normal. She exhibits no distension and no mass. There is no tenderness. There is no rebound and no guarding.  Genitourinary: Vagina normal.  Musculoskeletal: Normal range of motion. She exhibits no edema and no tenderness.  Neurological: She is alert and oriented to person, place, and time. She has normal reflexes. No cranial nerve deficit. She exhibits normal muscle tone. Coordination normal.  Skin: Skin is warm and dry.  No rash noted.  Psychiatric: She has a normal mood and affect. Her behavior is normal.          Assessment & Plan:  Preventive health examination Coronary artery disease stable   history of TIA. Asymptomatic. Will decrease aspirin to 81 mg daily  Dyslipidemia continue niacin and WelChol. We'll review a lipid profile

## 2011-06-14 NOTE — Patient Instructions (Signed)
Limit your sodium (Salt) intake    It is important that you exercise regularly, at least 20 minutes 3 to 4 times per week.  If you develop chest pain or shortness of breath seek  medical attention.  Return in 6 months for follow-up  

## 2011-06-14 NOTE — Progress Notes (Signed)
Quick Note:  Spoke with pt - informed of labs ______ 

## 2011-06-29 ENCOUNTER — Encounter (HOSPITAL_COMMUNITY): Payer: Self-pay

## 2011-06-29 ENCOUNTER — Emergency Department (HOSPITAL_COMMUNITY)
Admission: EM | Admit: 2011-06-29 | Discharge: 2011-06-29 | Disposition: A | Discharge status: Home / Self Care | Payer: Medicare Other | Attending: Emergency Medicine | Admitting: Emergency Medicine

## 2011-06-29 ENCOUNTER — Emergency Department (HOSPITAL_COMMUNITY): Payer: Medicare Other

## 2011-06-29 DIAGNOSIS — R197 Diarrhea, unspecified: Secondary | ICD-10-CM | POA: Diagnosis not present

## 2011-06-29 DIAGNOSIS — I2510 Atherosclerotic heart disease of native coronary artery without angina pectoris: Secondary | ICD-10-CM | POA: Diagnosis not present

## 2011-06-29 DIAGNOSIS — J189 Pneumonia, unspecified organism: Secondary | ICD-10-CM

## 2011-06-29 DIAGNOSIS — I10 Essential (primary) hypertension: Secondary | ICD-10-CM | POA: Insufficient documentation

## 2011-06-29 DIAGNOSIS — R109 Unspecified abdominal pain: Secondary | ICD-10-CM | POA: Diagnosis not present

## 2011-06-29 DIAGNOSIS — K219 Gastro-esophageal reflux disease without esophagitis: Secondary | ICD-10-CM | POA: Diagnosis not present

## 2011-06-29 DIAGNOSIS — M1990 Unspecified osteoarthritis, unspecified site: Secondary | ICD-10-CM | POA: Insufficient documentation

## 2011-06-29 DIAGNOSIS — Z8673 Personal history of transient ischemic attack (TIA), and cerebral infarction without residual deficits: Secondary | ICD-10-CM | POA: Diagnosis not present

## 2011-06-29 DIAGNOSIS — R509 Fever, unspecified: Secondary | ICD-10-CM | POA: Insufficient documentation

## 2011-06-29 DIAGNOSIS — Z79899 Other long term (current) drug therapy: Secondary | ICD-10-CM | POA: Diagnosis not present

## 2011-06-29 DIAGNOSIS — Z7982 Long term (current) use of aspirin: Secondary | ICD-10-CM | POA: Diagnosis not present

## 2011-06-29 DIAGNOSIS — N949 Unspecified condition associated with female genital organs and menstrual cycle: Secondary | ICD-10-CM | POA: Diagnosis not present

## 2011-06-29 DIAGNOSIS — E785 Hyperlipidemia, unspecified: Secondary | ICD-10-CM | POA: Diagnosis not present

## 2011-06-29 DIAGNOSIS — K589 Irritable bowel syndrome without diarrhea: Secondary | ICD-10-CM | POA: Diagnosis not present

## 2011-06-29 DIAGNOSIS — R259 Unspecified abnormal involuntary movements: Secondary | ICD-10-CM | POA: Diagnosis not present

## 2011-06-29 DIAGNOSIS — R112 Nausea with vomiting, unspecified: Secondary | ICD-10-CM | POA: Insufficient documentation

## 2011-06-29 DIAGNOSIS — R1110 Vomiting, unspecified: Secondary | ICD-10-CM | POA: Diagnosis not present

## 2011-06-29 LAB — CBC
HCT: 37.8 % (ref 36.0–46.0)
MCH: 29.6 pg (ref 26.0–34.0)
MCHC: 33.1 g/dL (ref 30.0–36.0)
MCV: 89.4 fL (ref 78.0–100.0)
RDW: 12.7 % (ref 11.5–15.5)

## 2011-06-29 LAB — COMPREHENSIVE METABOLIC PANEL
AST: 17 U/L (ref 0–37)
Albumin: 3.5 g/dL (ref 3.5–5.2)
BUN: 13 mg/dL (ref 6–23)
Calcium: 9.3 mg/dL (ref 8.4–10.5)
Creatinine, Ser: 0.55 mg/dL (ref 0.50–1.10)
Total Protein: 6.7 g/dL (ref 6.0–8.3)

## 2011-06-29 LAB — URINE MICROSCOPIC-ADD ON

## 2011-06-29 LAB — DIFFERENTIAL
Basophils Absolute: 0 10*3/uL (ref 0.0–0.1)
Basophils Relative: 0 % (ref 0–1)
Eosinophils Absolute: 0.1 10*3/uL (ref 0.0–0.7)
Eosinophils Relative: 0 % (ref 0–5)
Monocytes Absolute: 1.2 10*3/uL — ABNORMAL HIGH (ref 0.1–1.0)
Neutro Abs: 11.9 10*3/uL — ABNORMAL HIGH (ref 1.7–7.7)

## 2011-06-29 LAB — URINALYSIS, ROUTINE W REFLEX MICROSCOPIC
Glucose, UA: NEGATIVE mg/dL
Hgb urine dipstick: NEGATIVE
Specific Gravity, Urine: 1.016 (ref 1.005–1.030)

## 2011-06-29 MED ORDER — MOXIFLOXACIN HCL 400 MG PO TABS: 400.0000 mg | ORAL_TABLET | Freq: Every day | ORAL | Status: AC

## 2011-06-29 MED ORDER — MOXIFLOXACIN HCL 400 MG PO TABS
400.0000 mg | ORAL_TABLET | Freq: Once | ORAL | Status: AC
  Administered 2011-06-29: 400 mg via ORAL
  Filled 2011-06-29: qty 1

## 2011-06-29 MED ORDER — SODIUM CHLORIDE 0.9 % IV BOLUS (SEPSIS)
1000.0000 mL | Freq: Once | INTRAVENOUS | Status: AC
  Administered 2011-06-29: 1000 mL via INTRAVENOUS

## 2011-06-29 MED ORDER — ACETAMINOPHEN 325 MG PO TABS
650.0000 mg | ORAL_TABLET | Freq: Once | ORAL | Status: AC
  Administered 2011-06-29: 650 mg via ORAL
  Filled 2011-06-29: qty 2

## 2011-06-29 NOTE — ED Notes (Signed)
Patient transported to X-ray 

## 2011-06-29 NOTE — ED Provider Notes (Addendum)
History     CSN: [id number]  Arrival date & time 06/29/11  0940   First MD Initiated Contact with Patient 06/29/11 1001      Chief Complaint  Patient presents with  . Nausea  . Emesis    (Consider location/radiation/quality/duration/timing/severity/associated sxs/prior treatment) Patient is a 78 y.o. female presenting with vomiting. The history is provided by the patient.  Emesis  This is a new problem. The current episode started 1 to 2 hours ago. The problem occurs 2 to 4 times per day. The problem has been resolved. The emesis has an appearance of stomach contents. The maximum temperature recorded prior to her arrival was 101 to 101.9 F. The fever has been present for less than 1 day. Associated symptoms include chills, diarrhea and a fever. Pertinent negatives include no cough and no URI. Associated symptoms comments: Lower abdominal cramps. Risk factors: None.    Past Medical History  Diagnosis Date  . Acute posthemorrhagic anemia 10/23/2009  . BACK PAIN, LUMBAR 09/27/2009  . CORONARY ARTERY DISEASE 11/25/2006  . DIVERTICULITIS, HX OF 11/25/2006  . FATIGUE 07/21/2008  . GERD 11/25/2006  . HYPERLIPIDEMIA 11/25/2006  . HYPERTENSION 11/25/2006  . IBS 12/07/2008  . MIGRAINE WITH AURA 08/13/2007  . Muscle weakness (generalized) 09/27/2009  . OSTEOARTHRITIS 08/13/2007  . PALPITATIONS, HX OF 01/04/2010  . TRANSIENT ISCHEMIC ATTACK 04/13/2007  . Hiatal hernia     Past Surgical History  Procedure Date  . Coronary artery bypass graft   . Abdominal hysterectomy   . Tonsillectomy     No family history on file.  History  Substance Use Topics  . Smoking status: Never Smoker   . Smokeless tobacco: Never Used  . Alcohol Use: No    OB History    Grav Para Term Preterm Abortions TAB SAB Ect Mult Living                  Review of Systems  Constitutional: Positive for fever and chills.  Respiratory: Negative for cough.   Gastrointestinal: Positive for vomiting and diarrhea.  All  other systems reviewed and are negative.    Allergies  Hydrocodone; Ibuprofen; Oxycodone hcl; Penicillins; Prednisone; and Statins  Home Medications   Current Outpatient Rx  Name Route Sig Dispense Refill  . ASPIRIN 81 MG PO TABS Oral Take 1 tablet (81 mg total) by mouth daily. 30 tablet 6  . CLOPIDOGREL BISULFATE 75 MG PO TABS Oral Take 1 tablet (75 mg total) by mouth daily. 90 tablet 6  . COLESEVELAM HCL 625 MG PO TABS Oral Take 3 tablets (1,875 mg total) by mouth 2 (two) times daily with a meal. 360 tablet 6  . FLAXSEED OIL 1000 MG PO CAPS Oral Take by mouth daily.      Marland Kitchen LORAZEPAM 0.5 MG PO TABS Oral Take 1 tablet (0.5 mg total) by mouth every 8 (eight) hours as needed for anxiety. 90 tablet 3  . METOPROLOL SUCCINATE ER 50 MG PO TB24  TAKE ONE TABLET EVERY DAY 90 tablet 1  . ONE-DAILY MULTI VITAMINS PO TABS Oral Take 1 tablet by mouth daily.      Marland Kitchen NIACIN ER 500 MG PO CPCR Oral Take 500 mg by mouth at bedtime.      Marland Kitchen FISH OIL 1000 MG PO CAPS Oral Take by mouth daily.      Marland Kitchen PANTOPRAZOLE SODIUM 40 MG PO TBEC Oral Take 1 tablet (40 mg total) by mouth daily. 90 tablet 6  . SERTRALINE HCL 25  MG PO TABS Oral Take 1 tablet (25 mg total) by mouth daily. 30 tablet 2  . VITAMIN C 500 MG PO TABS Oral Take 500 mg by mouth daily.      Marland Kitchen ZOLPIDEM TARTRATE 5 MG PO TABS Oral Take 1 tablet (5 mg total) by mouth at bedtime as needed for sleep. 30 tablet 0    BP 147/53  Pulse 86  Temp(Src) 100.6 F (38.1 C) (Oral)  Resp 18  Physical Exam  Nursing note and vitals reviewed. Constitutional: She is oriented to person, place, and time. She appears well-developed and well-nourished. She appears distressed.  HENT:  Head: Normocephalic and atraumatic.  Right Ear: Tympanic membrane and ear canal normal.  Left Ear: Tympanic membrane and ear canal normal.  Mouth/Throat: Mucous membranes are dry.  Eyes: EOM are normal. Pupils are equal, round, and reactive to light.  Cardiovascular: Normal rate,  regular rhythm, normal heart sounds and intact distal pulses.  Exam reveals no friction rub.   No murmur heard. Pulmonary/Chest: Effort normal and breath sounds normal. She has no wheezes. She has no rales.  Abdominal: Soft. Bowel sounds are normal. She exhibits no distension. There is tenderness in the right lower quadrant, suprapubic area and left lower quadrant. There is no rebound and no guarding.       Mild lower abdominal tenderness diffusely  Musculoskeletal: Normal range of motion. She exhibits no tenderness.       No edema  Neurological: She is alert and oriented to person, place, and time. No cranial nerve deficit.  Skin: Skin is warm and dry. No rash noted.  Psychiatric: She has a normal mood and affect. Her behavior is normal.    ED Course  Procedures (including critical care time)  Labs Reviewed  CBC - Abnormal; Notable for the following:    WBC 13.8 (*)    All other components within normal limits  DIFFERENTIAL - Abnormal; Notable for the following:    Neutrophils Relative 87 (*)    Neutro Abs 11.9 (*)    Lymphocytes Relative 4 (*)    Lymphs Abs 0.6 (*)    Monocytes Absolute 1.2 (*)    All other components within normal limits  COMPREHENSIVE METABOLIC PANEL - Abnormal; Notable for the following:    Glucose, Bld 100 (*)    GFR calc non Af Amer 85 (*)    All other components within normal limits  URINALYSIS, ROUTINE W REFLEX MICROSCOPIC - Abnormal; Notable for the following:    APPearance CLOUDY (*)    Leukocytes, UA MODERATE (*)    All other components within normal limits  URINE MICROSCOPIC-ADD ON - Abnormal; Notable for the following:    Squamous Epithelial / LPF FEW (*)    Bacteria, UA FEW (*)    All other components within normal limits   Dg Abd Acute W/chest  06/29/2011  *RADIOLOGY REPORT*  Clinical Data: 78 year old female with abdominal pelvic pain and vomiting.  ACUTE ABDOMEN SERIES (ABDOMEN 2 VIEW & CHEST 1 VIEW)  Comparison: 09/28/2009 chest radiograph   Findings: Patchy airspace opacities within the mid and lower right lung are suspicious for pneumonia or possibly aspiration. Evidence of previous cardiac surgery noted. The cardiomediastinal silhouette is stable. There is no evidence of pleural effusion or pneumothorax.  The bowel gas pattern is unremarkable. There is no evidence of bowel obstruction or pneumoperitoneum. No suspicious calcifications are present. No acute bony abnormalities are identified.  IMPRESSION: Patchy airspace opacities within the mid and lower right lung  likely representing pneumonia.  Unremarkable bowel gas pattern.  Original Report Authenticated By: Rosendo Gros, M.D.     1. CAP (community acquired pneumonia)       MDM   Pt with symptoms most consistent with a viral process with fever/vomitting/diarrhea.  Denies bad food exposure and recent travel out of the country.  No recent abx.  No hx concerning for GU pathology or kidney stones. Mild lower abd pain but without peritoneal signs.  No signs concerning for appendicitis or diverticulitis.  Patient is febrile here and will be given Tylenol. She denies any nausea currently. Last emesis was while she was at home. CBC, CMP, UA pending. Will give IV fluid bolus.  Patient with mild leukocytosis of 13,000 and normal CMP. Chest x-ray showed right-sided pneumonia which is most likely the cause of her symptoms today. Will treat with antibiotics. Patient is not short of breath has normal vital signs and feel she can go home and return for any worsening symptoms.          Gwyneth Sprout, MD 06/29/11 1141  Gwyneth Sprout, MD 06/29/11 1144

## 2011-06-29 NOTE — ED Notes (Signed)
Per EMS..the patient called them this morning because of nausea, vomiting a "tremors" as well as being chilled.  Hypertensive.  Pt felt warm to the touch.  Pt did vomit a large amt while EMS was at her house.

## 2011-06-29 NOTE — Discharge Instructions (Signed)

## 2011-06-29 NOTE — ED Notes (Signed)
Patient and family verbalized complete understanding of all the d/c home instructions and medications

## 2011-07-01 ENCOUNTER — Telehealth: Payer: Self-pay | Admitting: *Deleted

## 2011-07-01 NOTE — Telephone Encounter (Signed)
Needs appt with Dr Kirtland Bouchard this week after ER pneumonia diagnosis.  Appt made tomorrow.

## 2011-07-02 ENCOUNTER — Ambulatory Visit (INDEPENDENT_AMBULATORY_CARE_PROVIDER_SITE_OTHER): Payer: Medicare Other | Admitting: Internal Medicine

## 2011-07-02 ENCOUNTER — Encounter: Payer: Self-pay | Admitting: Internal Medicine

## 2011-07-02 DIAGNOSIS — E785 Hyperlipidemia, unspecified: Secondary | ICD-10-CM

## 2011-07-02 DIAGNOSIS — J189 Pneumonia, unspecified organism: Secondary | ICD-10-CM

## 2011-07-02 DIAGNOSIS — M1990 Unspecified osteoarthritis, unspecified site: Secondary | ICD-10-CM

## 2011-07-02 DIAGNOSIS — I10 Essential (primary) hypertension: Secondary | ICD-10-CM | POA: Diagnosis not present

## 2011-07-02 NOTE — Progress Notes (Signed)
  Subjective:    Patient ID: Marissa Bowen, female    DOB: 05/10/1926, 78 y.o.   MRN: [id number]  HPI 78 year old patient who was evaluated in the emergency department 4 days ago after presenting with acute fever and chills and was diagnosed with a patchy right mid and right lower lobe pneumonia. She presently is on Avelox therapy and continues to improve. No further fever or chills her cough likewise continues to improve no shortness of breath chest pain. ED records reviewed    Review of Systems  Constitutional: Positive for fever, chills, activity change, appetite change and fatigue.  HENT: Negative for hearing loss, congestion, sore throat, rhinorrhea, dental problem, sinus pressure and tinnitus.   Eyes: Negative for pain, discharge and visual disturbance.  Respiratory: Positive for cough. Negative for shortness of breath.   Cardiovascular: Negative for chest pain, palpitations and leg swelling.  Gastrointestinal: Negative for nausea, vomiting, abdominal pain, diarrhea, constipation, blood in stool and abdominal distention.  Genitourinary: Negative for dysuria, urgency, frequency, hematuria, flank pain, vaginal bleeding, vaginal discharge, difficulty urinating, vaginal pain and pelvic pain.  Musculoskeletal: Negative for joint swelling, arthralgias and gait problem.  Skin: Negative for rash.  Neurological: Negative for dizziness, syncope, speech difficulty, weakness, numbness and headaches.  Hematological: Negative for adenopathy.  Psychiatric/Behavioral: Negative for behavioral problems, dysphoric mood and agitation. The patient is not nervous/anxious.        Objective:   Physical Exam  Constitutional: She is oriented to person, place, and time. She appears well-developed and well-nourished.  HENT:  Head: Normocephalic.  Right Ear: External ear normal.  Left Ear: External ear normal.  Mouth/Throat: Oropharynx is clear and moist.  Eyes: Conjunctivae and EOM are normal. Pupils are  equal, round, and reactive to light.  Neck: Normal range of motion. Neck supple. No thyromegaly present.  Cardiovascular: Normal rate, regular rhythm, normal heart sounds and intact distal pulses.   Pulmonary/Chest: Effort normal.       A few crackles suggestive involving the right midlung. O2 saturation 97%. Pulse rate 55  Abdominal: Soft. Bowel sounds are normal. She exhibits no mass. There is no tenderness.  Musculoskeletal: Normal range of motion.  Lymphadenopathy:    She has no cervical adenopathy.  Neurological: She is alert and oriented to person, place, and time.  Skin: Skin is warm and dry. No rash noted.  Psychiatric: She has a normal mood and affect. Her behavior is normal.          Assessment & Plan:   Community-acquired pneumonia. We'll continue Avelox Dyslipidemia Hypertension stable Coronary artery disease stable  Recheck in 6 months for her annual exam

## 2011-07-18 DIAGNOSIS — N8182 Incompetence or weakening of pubocervical tissue: Secondary | ICD-10-CM | POA: Diagnosis not present

## 2011-08-12 ENCOUNTER — Other Ambulatory Visit: Payer: Self-pay | Admitting: Internal Medicine

## 2011-09-02 ENCOUNTER — Ambulatory Visit (INDEPENDENT_AMBULATORY_CARE_PROVIDER_SITE_OTHER)
Admission: RE | Admit: 2011-09-02 | Discharge: 2011-09-02 | Disposition: A | Discharge status: Home / Self Care | Payer: Medicare Other | Source: Ambulatory Visit | Attending: Family | Admitting: Family

## 2011-09-02 ENCOUNTER — Ambulatory Visit (INDEPENDENT_AMBULATORY_CARE_PROVIDER_SITE_OTHER): Payer: Medicare Other | Admitting: Family

## 2011-09-02 ENCOUNTER — Encounter: Payer: Self-pay | Admitting: Family

## 2011-09-02 VITALS — BP 180/80 | Temp 98.9°F | Wt 158.0 lb

## 2011-09-02 DIAGNOSIS — IMO0001 Reserved for inherently not codable concepts without codable children: Secondary | ICD-10-CM | POA: Diagnosis not present

## 2011-09-02 DIAGNOSIS — T148XXA Other injury of unspecified body region, initial encounter: Secondary | ICD-10-CM | POA: Diagnosis not present

## 2011-09-02 DIAGNOSIS — W57XXXA Bitten or stung by nonvenomous insect and other nonvenomous arthropods, initial encounter: Secondary | ICD-10-CM | POA: Diagnosis not present

## 2011-09-02 DIAGNOSIS — R1032 Left lower quadrant pain: Secondary | ICD-10-CM | POA: Diagnosis not present

## 2011-09-02 DIAGNOSIS — R109 Unspecified abdominal pain: Secondary | ICD-10-CM | POA: Diagnosis not present

## 2011-09-02 DIAGNOSIS — T148 Other injury of unspecified body region: Secondary | ICD-10-CM

## 2011-09-02 DIAGNOSIS — M791 Myalgia, unspecified site: Secondary | ICD-10-CM

## 2011-09-02 LAB — POCT URINALYSIS DIPSTICK
Glucose, UA: NEGATIVE
Nitrite, UA: NEGATIVE
Urobilinogen, UA: 0.2

## 2011-09-02 LAB — BASIC METABOLIC PANEL
BUN: 11 mg/dL (ref 6–23)
Calcium: 9.2 mg/dL (ref 8.4–10.5)
GFR: 107.7 mL/min (ref >60.00)
Glucose, Bld: 107 mg/dL — ABNORMAL HIGH (ref 70–99)
Potassium: 4.4 mEq/L (ref 3.5–5.1)
Sodium: 141 mEq/L (ref 135–145)

## 2011-09-02 LAB — CBC WITH DIFFERENTIAL/PLATELET
Basophils Absolute: 0 10*3/uL (ref 0.0–0.1)
Eosinophils Relative: 1.5 % (ref 0.0–5.0)
HCT: 38 % (ref 36.0–46.0)
Lymphocytes Relative: 11.4 % — ABNORMAL LOW (ref 12.0–46.0)
Lymphs Abs: 1.1 10*3/uL (ref 0.7–4.0)
Monocytes Relative: 8.5 % (ref 3.0–12.0)
Platelets: 204 10*3/uL (ref 150.0–400.0)
RDW: 13.6 % (ref 11.5–14.6)
WBC: 9.9 10*3/uL (ref 4.5–10.5)

## 2011-09-02 MED ORDER — SULFAMETHOXAZOLE-TRIMETHOPRIM 800-160 MG PO TABS: 1.0000 | ORAL_TABLET | Freq: Two times a day (BID) | ORAL | Status: AC

## 2011-09-02 NOTE — Progress Notes (Signed)
Subjective:    Patient ID: Marissa Bowen, female    DOB: 10/26/1926, 78 y.o.   MRN: [id number]  HPI 78 year old white female, nonsmoker, patient of Dr. Kirtland Bouchard. is in with complaints of abdominal pain and back pain x2 days. Rates the pain a 5/10 describes it as constant, and a dull ache. The pain is worse with defecation. She's also noticed it being worse with sneezing and coughing. She denies any frequency or urgency to urinate, no blood in her urine, foul-smelling urine. Denies any blood in her stools or dark black stools.  The patient also has concerns of generalized myalgias. Reports have been an insect bite to her right arm that is still red and tender to touch x3 weeks. Describes it as itchy and mildly tender. She's unsure of what kind of insect bit her.  Last colonoscopy was greater than 10 years ago.  Review of Systems  Constitutional: Negative.   Respiratory: Negative.   Cardiovascular: Negative.   Gastrointestinal: Positive for abdominal pain. Negative for nausea, vomiting, diarrhea, anal bleeding and rectal pain.  Genitourinary: Negative.   Musculoskeletal: Positive for myalgias. Negative for joint swelling, arthralgias and gait problem.  Skin: Positive for rash.  Neurological: Negative.   Psychiatric/Behavioral: Negative.       Past Medical History  Diagnosis Date  . Acute posthemorrhagic anemia 10/23/2009  . BACK PAIN, LUMBAR 09/27/2009  . CORONARY ARTERY DISEASE 11/25/2006  . DIVERTICULITIS, HX OF 11/25/2006  . FATIGUE 07/21/2008  . GERD 11/25/2006  . HYPERLIPIDEMIA 11/25/2006  . HYPERTENSION 11/25/2006  . IBS 12/07/2008  . MIGRAINE WITH AURA 08/13/2007  . Muscle weakness (generalized) 09/27/2009  . OSTEOARTHRITIS 08/13/2007  . PALPITATIONS, HX OF 01/04/2010  . TRANSIENT ISCHEMIC ATTACK 04/13/2007  . Hiatal hernia     History   Social History  . Marital Status: Married    Spouse Name: N/A    Number of Children: N/A  . Years of Education: N/A   Occupational History  . Not  on file.   Social History Main Topics  . Smoking status: Never Smoker   . Smokeless tobacco: Never Used  . Alcohol Use: No  . Drug Use: No  . Sexually Active: Not on file   Other Topics Concern  . Not on file   Social History Narrative  . No narrative on file    Past Surgical History  Procedure Date  . Coronary artery bypass graft   . Abdominal hysterectomy   . Tonsillectomy     No family history on file.  Allergies  Allergen Reactions  . Hydrocodone     REACTION: migraines  . Ibuprofen Other (See Comments)    ulcers  . Oxycodone Hcl     REACTION: migraines  . Prednisone Other (See Comments)    ulcers  . Statins Other (See Comments)    Aches and pains  . Penicillins Swelling and Rash    Current Outpatient Prescriptions on File Prior to Visit  Medication Sig Dispense Refill  . aspirin 81 MG tablet Take 1 tablet (81 mg total) by mouth daily.  30 tablet  6  . clopidogrel (PLAVIX) 75 MG tablet Take 1 tablet (75 mg total) by mouth daily.  90 tablet  6  . colesevelam (WELCHOL) 625 MG tablet Take 3 tablets (1,875 mg total) by mouth 2 (two) times daily with a meal.  360 tablet  6  . Flaxseed, Linseed, (FLAXSEED OIL) 1000 MG CAPS Take by mouth daily.        Marland Kitchen  LORazepam (ATIVAN) 0.5 MG tablet Take 1 tablet (0.5 mg total) by mouth every 8 (eight) hours as needed for anxiety.  90 tablet  3  . metoprolol succinate (TOPROL-XL) 50 MG 24 hr tablet TAKE ONE TABLET EVERY DAY  90 tablet  3  . Multiple Vitamin (MULTIVITAMIN) tablet Take 1 tablet by mouth daily.        . niacin 500 MG CR capsule Take 500 mg by mouth at bedtime.        . Omega-3 Fatty Acids (FISH OIL) 1000 MG CAPS Take by mouth daily.        . pantoprazole (PROTONIX) 40 MG tablet Take 1 tablet (40 mg total) by mouth daily.  90 tablet  6  . sertraline (ZOLOFT) 25 MG tablet Take 1 tablet (25 mg total) by mouth daily.  30 tablet  2  . vitamin C (ASCORBIC ACID) 500 MG tablet Take 500 mg by mouth daily.        Marland Kitchen zolpidem  (AMBIEN) 5 MG tablet Take 1 tablet (5 mg total) by mouth at bedtime as needed for sleep.  30 tablet  0    BP 180/80  Temp(Src) 98.9 F (37.2 C) (Oral)  Wt 158 lb (71.668 kg)chart  Objective:   Physical Exam  Constitutional: She is oriented to person, place, and time. She appears well-developed and well-nourished.  HENT:  Right Ear: External ear normal.  Left Ear: External ear normal.  Nose: Nose normal.  Mouth/Throat: Oropharynx is clear and moist.  Neck: Normal range of motion. Neck supple.  Cardiovascular: Normal rate, regular rhythm and normal heart sounds.   Pulmonary/Chest: Effort normal and breath sounds normal.  Abdominal: Soft. Bowel sounds are normal. There is tenderness.       Over the symphysis pubis. In the left lower quadrant.  Musculoskeletal: Normal range of motion.  Neurological: She is alert and oriented to person, place, and time.  Skin: Skin is warm and dry.       Pain, rash noted to the right forearm that is about 3 inches in diameter. No drainage or discharge. Mildly tender to palpation.  Psychiatric: She has a normal mood and affect.          Assessment & Plan:  Assessment: Abdominal pain, back pain, urinary tract infection, myalgias, insect bite  Plan: We'll start Bactrim DS one tablet twice a day x10 days. We'll obtain a KUB of her abdomen. Lab sent to include BMP, CBC, [place] spotted fever, Lyme disease titer. Will notify patient pending results. patient to call the office if her symptoms worsen or persist, recheck a schedule, and when necessary.

## 2011-09-02 NOTE — Patient Instructions (Signed)

## 2011-09-03 LAB — B. BURGDORFI ANTIBODIES: B burgdorferi Ab IgG+IgM: 0.23 {ISR}

## 2011-09-04 NOTE — Progress Notes (Signed)
Quick Note:  Pt informed ______ 

## 2011-09-05 LAB — ROCKY MTN SPOTTED FVR AB, IGG-BLOOD: RMSF IgG: 0.19 IV

## 2011-09-19 DIAGNOSIS — N8182 Incompetence or weakening of pubocervical tissue: Secondary | ICD-10-CM | POA: Diagnosis not present

## 2011-10-01 ENCOUNTER — Other Ambulatory Visit: Payer: Self-pay

## 2011-10-01 MED ORDER — LORAZEPAM 0.5 MG PO TABS: 0.5000 mg | ORAL_TABLET | Freq: Three times a day (TID) | ORAL | Status: DC | PRN

## 2011-10-07 DIAGNOSIS — J342 Deviated nasal septum: Secondary | ICD-10-CM | POA: Diagnosis not present

## 2011-10-07 DIAGNOSIS — K219 Gastro-esophageal reflux disease without esophagitis: Secondary | ICD-10-CM | POA: Diagnosis not present

## 2011-10-07 DIAGNOSIS — R490 Dysphonia: Secondary | ICD-10-CM | POA: Diagnosis not present

## 2011-10-07 DIAGNOSIS — J310 Chronic rhinitis: Secondary | ICD-10-CM | POA: Diagnosis not present

## 2011-10-19 ENCOUNTER — Emergency Department (INDEPENDENT_AMBULATORY_CARE_PROVIDER_SITE_OTHER): Payer: Medicare Other

## 2011-10-19 ENCOUNTER — Encounter (HOSPITAL_COMMUNITY): Payer: Self-pay | Admitting: *Deleted

## 2011-10-19 ENCOUNTER — Emergency Department (INDEPENDENT_AMBULATORY_CARE_PROVIDER_SITE_OTHER)
Admission: EM | Admit: 2011-10-19 | Discharge: 2011-10-19 | Disposition: A | Discharge status: Home / Self Care | Payer: Medicare Other | Source: Home / Self Care | Attending: Emergency Medicine | Admitting: Emergency Medicine

## 2011-10-19 DIAGNOSIS — N390 Urinary tract infection, site not specified: Secondary | ICD-10-CM

## 2011-10-19 DIAGNOSIS — K5900 Constipation, unspecified: Secondary | ICD-10-CM

## 2011-10-19 DIAGNOSIS — M545 Low back pain: Secondary | ICD-10-CM | POA: Diagnosis not present

## 2011-10-19 DIAGNOSIS — J984 Other disorders of lung: Secondary | ICD-10-CM | POA: Diagnosis not present

## 2011-10-19 LAB — POCT URINALYSIS DIP (DEVICE)
Bilirubin Urine: NEGATIVE
Glucose, UA: NEGATIVE mg/dL
Hgb urine dipstick: NEGATIVE
Nitrite: NEGATIVE
Urobilinogen, UA: 0.2 mg/dL (ref 0.0–1.0)

## 2011-10-19 MED ORDER — NITROFURANTOIN MONOHYD MACRO 100 MG PO CAPS: 100.0000 mg | ORAL_CAPSULE | Freq: Two times a day (BID) | ORAL | Status: AC

## 2011-10-19 NOTE — ED Provider Notes (Signed)
History     CSN: [id number]  Arrival date & time 10/19/11  1110   First MD Initiated Contact with Patient 10/19/11 1124      Chief Complaint  Patient presents with  . Urinary Tract Infection  . Back Pain    (Consider location/radiation/quality/duration/timing/severity/associated sxs/prior treatment) HPI Comments: Patient reports crampy, lower back and midline abdominal pain with urinary frequency for the past 4 or 5 days. This is not affected with eating, fasting, exertion, urination. states that she has also been constipated during this time, and took some MiraLax several days ago and last night. States she had a normal bowel movement this morning, but this did not affect her abdominal pain. No fevers, nausea, vomiting, hematuria, cloudy or oderous urine, abdominal distention. No coughing, wheezing, chest pain, shortness of breath. States this feels similar to previous recent UTI.   On chart review, Patient seen on 3/2 for abdominal pain, vomiting, found to have a right-sided pneumonia, which was treated outpatient antibiotics. Seen by PMD on 5/6 for abdominal and back pain for 2 days. thought to have abdominal pain/back pain/UTI. Sent home with Bactrim.  ROS as noted in HPI. All other ROS negative.   Patient is a 78 y.o. female presenting with urinary tract infection and back pain. The history is provided by the patient and medical records. No language interpreter was used.  Urinary Tract Infection This is a new problem. The current episode started more than 2 days ago. The problem has not changed since onset.Associated symptoms include abdominal pain. Pertinent negatives include no chest pain and no shortness of breath. Nothing aggravates the symptoms. Nothing relieves the symptoms.  Back Pain  Associated symptoms include abdominal pain. Pertinent negatives include no chest pain.    Past Medical History  Diagnosis Date  . Acute posthemorrhagic anemia 10/23/2009  . BACK PAIN, LUMBAR  09/27/2009  . CORONARY ARTERY DISEASE 11/25/2006  . DIVERTICULITIS, HX OF 11/25/2006  . FATIGUE 07/21/2008  . GERD 11/25/2006  . HYPERLIPIDEMIA 11/25/2006  . HYPERTENSION 11/25/2006  . IBS 12/07/2008  . MIGRAINE WITH AURA 08/13/2007  . Muscle weakness (generalized) 09/27/2009  . OSTEOARTHRITIS 08/13/2007  . PALPITATIONS, HX OF 01/04/2010  . TRANSIENT ISCHEMIC ATTACK 04/13/2007  . Hiatal hernia     Past Surgical History  Procedure Date  . Coronary artery bypass graft   . Abdominal hysterectomy   . Tonsillectomy     History reviewed. No pertinent family history.  History  Substance Use Topics  . Smoking status: Never Smoker   . Smokeless tobacco: Never Used  . Alcohol Use: No    OB History    Grav Para Term Preterm Abortions TAB SAB Ect Mult Living                  Review of Systems  Respiratory: Negative for shortness of breath.   Cardiovascular: Negative for chest pain.  Gastrointestinal: Positive for abdominal pain.  Musculoskeletal: Positive for back pain.    Allergies  Hydrocodone; Ibuprofen; Oxycodone hcl; Prednisone; Statins; and Penicillins  Home Medications   Current Outpatient Rx  Name Route Sig Dispense Refill  . ASPIRIN 81 MG PO TABS Oral Take 1 tablet (81 mg total) by mouth daily. 30 tablet 6  . CLOPIDOGREL BISULFATE 75 MG PO TABS Oral Take 1 tablet (75 mg total) by mouth daily. 90 tablet 6  . COLESEVELAM HCL 625 MG PO TABS Oral Take 3 tablets (1,875 mg total) by mouth 2 (two) times daily with a meal. 360 tablet  6  . FLAXSEED OIL 1000 MG PO CAPS Oral Take by mouth daily.      Marland Kitchen LORAZEPAM 0.5 MG PO TABS Oral Take 1 tablet (0.5 mg total) by mouth every 8 (eight) hours as needed for anxiety. 90 tablet 3  . METOPROLOL SUCCINATE ER 50 MG PO TB24  TAKE ONE TABLET EVERY DAY 90 tablet 3  . ONE-DAILY MULTI VITAMINS PO TABS Oral Take 1 tablet by mouth daily.      Marland Kitchen NIACIN ER 500 MG PO CPCR Oral Take 500 mg by mouth at bedtime.      Marland Kitchen FISH OIL 1000 MG PO CAPS Oral Take by  mouth daily.      Marland Kitchen OMEPRAZOLE 20 MG PO CPDR Oral Take 20 mg by mouth 2 (two) times daily.    Marland Kitchen POLYETHYLENE GLYCOL 3350 PO PACK Oral Take 17 g by mouth daily.    Marland Kitchen NITROFURANTOIN MONOHYD MACRO 100 MG PO CAPS Oral Take 1 capsule (100 mg total) by mouth 2 (two) times daily. 10 capsule 0  . VITAMIN C 500 MG PO TABS Oral Take 500 mg by mouth daily.        BP 159/75  Pulse 66  Temp 98.3 F (36.8 C) (Oral)  Resp 17  SpO2 99%  Physical Exam  Nursing note and vitals reviewed. Constitutional: She is oriented to person, place, and time. She appears well-developed and well-nourished. No distress.  HENT:  Head: Normocephalic and atraumatic.  Eyes: Conjunctivae and EOM are normal.  Neck: Normal range of motion.  Cardiovascular: Normal rate and normal heart sounds.   Pulmonary/Chest: Effort normal and breath sounds normal.  Abdominal: Soft. Normal appearance. She exhibits no distension and no mass. There is no tenderness. There is no rebound, no guarding and no CVA tenderness.  Musculoskeletal: Normal range of motion.  Neurological: She is alert and oriented to person, place, and time.  Skin: Skin is warm and dry.  Psychiatric: She has a normal mood and affect. Her behavior is normal. Judgment and thought content normal.    ED Course  Procedures (including critical care time)  Labs Reviewed  POCT URINALYSIS DIP (DEVICE) - Abnormal; Notable for the following:    Leukocytes, UA SMALL (*)  Biochemical Testing Only. Please order routine urinalysis from main lab if confirmatory testing is needed.   All other components within normal limits  URINE CULTURE   Dg Abd Acute W/chest  10/19/2011  *RADIOLOGY REPORT*  Clinical Data: Low back pain.  ACUTE ABDOMEN SERIES (ABDOMEN 2 VIEW & CHEST 1 VIEW)  Comparison: 09/02/2011 and 06/29/2011  Findings: Chest radiograph demonstrates post CABG changes.  Lungs are clear without airspace disease, edema or pneumothorax. Airspace disease in the right lung has  cleared since the prior examination.  Mild elevation of the right hemidiaphragm.  No evidence for free air.  Nonobstructive bowel gas pattern in the abdomen.  Multiple phleboliths in the pelvis.  IMPRESSION: No acute chest findings.  Airspace disease in the right lung has cleared since 06/29/2011.  Nonobstructive bowel gas pattern.  Original Report Authenticated By: Richarda Overlie, M.D.     1. UTI (lower urinary tract infection)   2. Constipation       MDM  Previous records reviewed. Additional medical history obtained.   Checking acute abdominal series, as the patient was found to have pneumonia in March when she presented with abdominal symptoms. Lungs are clear. UA today with trace leukocytes. Abdomen is otherwise benign, vitals are acceptable. If imaging negative for  pneumonia or other acute intra-abdominal process, will treat as if this as UTI. Sending urine off for culture.  Imaging reviewed by myself. Report per radiologist. Will send patient home with Macrobid. Patient also with diffuse stool on x-ray. Discussed imaging and lab results with patient. Will have her continue the MiraLax. Discussed signs and symptoms that should prompt return. Patient agrees with plan.  Luiz Blare, MD 10/19/11 [phone]

## 2011-10-19 NOTE — ED Notes (Addendum)
Pt with previous uti having same symptoms x 3 days - low back pain - lower abd pain- per pt concerned protonix changed to prilosec taking twice a day for last two weeks will interfere with plavix -

## 2011-10-19 NOTE — Discharge Instructions (Signed)
Drink extra fluids. It may take up to 3 days for the miralax to take effect. may also drink prune and apple juice. Return if you have a fever, if you start having severe abdominal pain, a fever >100.4, or any other concerns.   Go to www.goodrx.com to look up your medications. This will give you a list of where you can find your prescriptions at the most affordable prices.   

## 2011-10-20 LAB — URINE CULTURE: Culture  Setup Time: 201306221725

## 2011-10-21 ENCOUNTER — Telehealth: Payer: Self-pay | Admitting: Internal Medicine

## 2011-10-21 NOTE — Telephone Encounter (Signed)
Have patient discontinue Prilosec and continue with the Protonix

## 2011-10-21 NOTE — Telephone Encounter (Signed)
Please advise 

## 2011-10-21 NOTE — Telephone Encounter (Signed)
Caller: Marissa Bowen/Mother; PCP: Eleonore Chiquito; CB#: [phone]; ; ; Call regarding Medication;  Pt had a throat scan with a camera by ENT  specialist/Dr. Jearld Fenton on 10/07/11. Pt has been having a chronic sore throat. Pt was prescribed Prilosec and dx with acid reflux. Pt then found out that if she is taking Plavix she should not be taking Prilosec. She called Dr. Jearld Fenton to notify him of this . She doesn't want Dr. Jearld Fenton prescribing anything else for her. She wants Dr. Kirtland Bouchard to take over. Pt takes Protonix as prescribed by Dr. Kirtland Bouchard. What else can he prescribe? (Nexium is too expensive). Pt uses [facility].

## 2011-10-22 NOTE — Telephone Encounter (Signed)
Spoke with pt- gave dr. Vernon Prey instructions - she has protonix at the home - will contact us when she needs more- changes to med list done

## 2011-10-24 DIAGNOSIS — N949 Unspecified condition associated with female genital organs and menstrual cycle: Secondary | ICD-10-CM | POA: Diagnosis not present

## 2011-10-24 DIAGNOSIS — N390 Urinary tract infection, site not specified: Secondary | ICD-10-CM | POA: Diagnosis not present

## 2011-11-05 ENCOUNTER — Ambulatory Visit: Payer: Medicare Other | Admitting: Internal Medicine

## 2011-11-06 ENCOUNTER — Ambulatory Visit: Payer: Medicare Other | Admitting: Internal Medicine

## 2011-11-06 DIAGNOSIS — N8182 Incompetence or weakening of pubocervical tissue: Secondary | ICD-10-CM | POA: Diagnosis not present

## 2011-11-14 DIAGNOSIS — Z1231 Encounter for screening mammogram for malignant neoplasm of breast: Secondary | ICD-10-CM | POA: Diagnosis not present

## 2011-11-14 DIAGNOSIS — Z803 Family history of malignant neoplasm of breast: Secondary | ICD-10-CM | POA: Diagnosis not present

## 2011-11-15 ENCOUNTER — Encounter: Payer: Self-pay | Admitting: Internal Medicine

## 2011-11-26 ENCOUNTER — Ambulatory Visit (INDEPENDENT_AMBULATORY_CARE_PROVIDER_SITE_OTHER): Payer: Medicare Other | Admitting: Internal Medicine

## 2011-11-26 ENCOUNTER — Encounter: Payer: Self-pay | Admitting: Internal Medicine

## 2011-11-26 VITALS — BP 110/80 | HR 62 | Temp 98.2°F | Resp 18 | Wt 158.0 lb

## 2011-11-26 DIAGNOSIS — E785 Hyperlipidemia, unspecified: Secondary | ICD-10-CM

## 2011-11-26 DIAGNOSIS — I10 Essential (primary) hypertension: Secondary | ICD-10-CM | POA: Diagnosis not present

## 2011-11-26 DIAGNOSIS — R5383 Other fatigue: Secondary | ICD-10-CM | POA: Diagnosis not present

## 2011-11-26 DIAGNOSIS — R5381 Other malaise: Secondary | ICD-10-CM

## 2011-11-26 DIAGNOSIS — I2510 Atherosclerotic heart disease of native coronary artery without angina pectoris: Secondary | ICD-10-CM

## 2011-11-26 NOTE — Patient Instructions (Signed)
.  Call or return to clinic prn if these symptoms worsen or fail to improve as anticipated.  Return in 6 months for follow-up  

## 2011-11-26 NOTE — Progress Notes (Signed)
Subjective:    Patient ID: Marissa Bowen, female    DOB: [birth year], 78 y.o.   MRN: [id number]  HPI  78 year old patient who is seen today for general followup. Medical problems include hypertension stable coronary artery disease and a history of TIA. She has osteoarthritis. Last week she developed some GI upset about 5 days ago since that time she has had some weakness over the past day or 2 seems to have improved. No fever. GI symptoms have resolved Denies any exertional chest pain or focal neurological symptoms. Medical regimen reviewed  Past Medical History  Diagnosis Date  . Acute posthemorrhagic anemia 10/23/2009  . BACK PAIN, LUMBAR 09/27/2009  . CORONARY ARTERY DISEASE 11/25/2006  . DIVERTICULITIS, HX OF 11/25/2006  . FATIGUE 07/21/2008  . GERD 11/25/2006  . HYPERLIPIDEMIA 11/25/2006  . HYPERTENSION 11/25/2006  . IBS 12/07/2008  . MIGRAINE WITH AURA 08/13/2007  . Muscle weakness (generalized) 09/27/2009  . OSTEOARTHRITIS 08/13/2007  . PALPITATIONS, HX OF 01/04/2010  . TRANSIENT ISCHEMIC ATTACK 04/13/2007  . Hiatal hernia     History   Social History  . Marital Status: Married    Spouse Name: N/A    Number of Children: N/A  . Years of Education: N/A   Occupational History  . Not on file.   Social History Main Topics  . Smoking status: Never Smoker   . Smokeless tobacco: Never Used  . Alcohol Use: No  . Drug Use: No  . Sexually Active: Not on file   Other Topics Concern  . Not on file   Social History Narrative  . No narrative on file    Past Surgical History  Procedure Date  . Coronary artery bypass graft   . Abdominal hysterectomy   . Tonsillectomy     No family history on file.  Allergies  Allergen Reactions  . Hydrocodone     REACTION: migraines  . Ibuprofen Other (See Comments)    ulcers  . Oxycodone Hcl     REACTION: migraines  . Prednisone Other (See Comments)    ulcers  . Statins Other (See Comments)    Aches and pains  . Penicillins Swelling and  Rash    Current Outpatient Prescriptions on File Prior to Visit  Medication Sig Dispense Refill  . aspirin 81 MG tablet Take 1 tablet (81 mg total) by mouth daily.  30 tablet  6  . clopidogrel (PLAVIX) 75 MG tablet Take 1 tablet (75 mg total) by mouth daily.  90 tablet  6  . colesevelam (WELCHOL) 625 MG tablet Take 3 tablets (1,875 mg total) by mouth 2 (two) times daily with a meal.  360 tablet  6  . Flaxseed, Linseed, (FLAXSEED OIL) 1000 MG CAPS Take by mouth daily.        Marland Kitchen LORazepam (ATIVAN) 0.5 MG tablet Take 1 tablet (0.5 mg total) by mouth every 8 (eight) hours as needed for anxiety.  90 tablet  3  . metoprolol succinate (TOPROL-XL) 50 MG 24 hr tablet TAKE ONE TABLET EVERY DAY  90 tablet  3  . Multiple Vitamin (MULTIVITAMIN) tablet Take 1 tablet by mouth daily.        . niacin 500 MG CR capsule Take 500 mg by mouth at bedtime.        . Omega-3 Fatty Acids (FISH OIL) 1000 MG CAPS Take by mouth daily.        . pantoprazole (PROTONIX) 40 MG tablet Take 40 mg by mouth daily.      Marland Kitchen  polyethylene glycol (MIRALAX / GLYCOLAX) packet Take 17 g by mouth daily.      . vitamin C (ASCORBIC ACID) 500 MG tablet Take 500 mg by mouth daily.        Marland Kitchen DISCONTD: sertraline (ZOLOFT) 25 MG tablet Take 1 tablet (25 mg total) by mouth daily.  30 tablet  2  . DISCONTD: zolpidem (AMBIEN) 5 MG tablet Take 1 tablet (5 mg total) by mouth at bedtime as needed for sleep.  30 tablet  0    BP 110/80  Pulse 62  Temp 98.2 F (36.8 C) (Oral)  Resp 18  Wt 158 lb (71.668 kg)  SpO2 98%      Review of Systems  HENT: Negative for hearing loss, congestion, sore throat, rhinorrhea, dental problem, sinus pressure and tinnitus.   Eyes: Negative for pain, discharge and visual disturbance.  Respiratory: Negative for cough and shortness of breath.   Cardiovascular: Negative for chest pain, palpitations and leg swelling.  Gastrointestinal: Negative for nausea, vomiting, abdominal pain, diarrhea, constipation, blood in  stool and abdominal distention.  Genitourinary: Negative for dysuria, urgency, frequency, hematuria, flank pain, vaginal bleeding, vaginal discharge, difficulty urinating, vaginal pain and pelvic pain.  Musculoskeletal: Negative for joint swelling, arthralgias and gait problem.  Skin: Negative for rash.  Neurological: Positive for weakness. Negative for dizziness, syncope, speech difficulty, numbness and headaches.  Hematological: Negative for adenopathy.  Psychiatric/Behavioral: Negative for behavioral problems, dysphoric mood and agitation. The patient is not nervous/anxious.        Objective:   Physical Exam  Constitutional: She is oriented to person, place, and time. She appears well-developed and well-nourished.  HENT:  Head: Normocephalic.  Right Ear: External ear normal.  Left Ear: External ear normal.  Mouth/Throat: Oropharynx is clear and moist.  Eyes: Conjunctivae and EOM are normal. Pupils are equal, round, and reactive to light.  Neck: Normal range of motion. Neck supple. No thyromegaly present.  Cardiovascular: Normal rate, regular rhythm, normal heart sounds and intact distal pulses.   Pulmonary/Chest: Effort normal and breath sounds normal.  Abdominal: Soft. Bowel sounds are normal. She exhibits no mass. There is no tenderness.  Musculoskeletal: Normal range of motion.  Lymphadenopathy:    She has no cervical adenopathy.  Neurological: She is alert and oriented to person, place, and time.  Skin: Skin is warm and dry. No rash noted.  Psychiatric: She has a normal mood and affect. Her behavior is normal.          Assessment & Plan:    Weakness-  Possible  Resolving viral illness.  HTN- stable  CAD- stab/e   CPX 6 mon

## 2011-11-28 DIAGNOSIS — N8182 Incompetence or weakening of pubocervical tissue: Secondary | ICD-10-CM | POA: Diagnosis not present

## 2012-01-08 DIAGNOSIS — N8111 Cystocele, midline: Secondary | ICD-10-CM | POA: Diagnosis not present

## 2012-01-22 ENCOUNTER — Telehealth: Payer: Self-pay | Admitting: Internal Medicine

## 2012-01-22 NOTE — Telephone Encounter (Signed)
Caller: Marissa Bowen/Patient; Phone: [phone]; Reason for Call: Patient calling to see if she can go ahead and get a refill on her Lorazeopam 0.  5mg , states she is a few days early and still has enough for 5 day but she is having company coming in Friday.  She did not want to have to worry about trying to get it refilled during that time.  Please call her back.  Thanks

## 2012-01-23 ENCOUNTER — Other Ambulatory Visit: Payer: Self-pay | Admitting: Internal Medicine

## 2012-01-23 NOTE — Telephone Encounter (Signed)
Called in RFs. 

## 2012-01-23 NOTE — Telephone Encounter (Signed)
I called this into [facility] this AM

## 2012-02-13 ENCOUNTER — Ambulatory Visit (INDEPENDENT_AMBULATORY_CARE_PROVIDER_SITE_OTHER): Payer: Medicare Other

## 2012-02-13 DIAGNOSIS — Z23 Encounter for immunization: Secondary | ICD-10-CM

## 2012-02-19 DIAGNOSIS — N8182 Incompetence or weakening of pubocervical tissue: Secondary | ICD-10-CM | POA: Diagnosis not present

## 2012-02-27 ENCOUNTER — Telehealth: Payer: Self-pay | Admitting: Internal Medicine

## 2012-02-27 NOTE — Telephone Encounter (Signed)
Samples ready for pick up  - pt aware

## 2012-02-27 NOTE — Telephone Encounter (Signed)
Caller: Freida/Patient; Patient Name: Minus Breeding; PCP: Eleonore Chiquito [facility]); Calling about needing refill of Welchol and she is wondering if office has samples.  Rx last filled 12/27/11. She has only been using 1/2 pill 6 x daily to make mediation last longer. She ran out of medication this morning-02/27/12. She has 5 relills left but can't afford to refill it until Jan 2014. PLEASE CALL HER TO LET HER KNOW IF SAMPLES AVAILABLE.  Callback Phone Number: [phone]

## 2012-03-25 ENCOUNTER — Emergency Department (HOSPITAL_COMMUNITY)
Admission: EM | Admit: 2012-03-25 | Discharge: 2012-03-25 | Disposition: A | Discharge status: Home / Self Care | Payer: Medicare Other | Attending: Emergency Medicine | Admitting: Emergency Medicine

## 2012-03-25 ENCOUNTER — Emergency Department (HOSPITAL_COMMUNITY): Payer: Medicare Other

## 2012-03-25 ENCOUNTER — Encounter (HOSPITAL_COMMUNITY): Payer: Self-pay | Admitting: Emergency Medicine

## 2012-03-25 DIAGNOSIS — Z8669 Personal history of other diseases of the nervous system and sense organs: Secondary | ICD-10-CM | POA: Insufficient documentation

## 2012-03-25 DIAGNOSIS — I10 Essential (primary) hypertension: Secondary | ICD-10-CM | POA: Insufficient documentation

## 2012-03-25 DIAGNOSIS — R5383 Other fatigue: Secondary | ICD-10-CM | POA: Diagnosis not present

## 2012-03-25 DIAGNOSIS — Z79899 Other long term (current) drug therapy: Secondary | ICD-10-CM | POA: Diagnosis not present

## 2012-03-25 DIAGNOSIS — I4891 Unspecified atrial fibrillation: Secondary | ICD-10-CM | POA: Diagnosis not present

## 2012-03-25 DIAGNOSIS — E785 Hyperlipidemia, unspecified: Secondary | ICD-10-CM | POA: Insufficient documentation

## 2012-03-25 DIAGNOSIS — Z7982 Long term (current) use of aspirin: Secondary | ICD-10-CM | POA: Diagnosis not present

## 2012-03-25 DIAGNOSIS — R6889 Other general symptoms and signs: Secondary | ICD-10-CM | POA: Diagnosis not present

## 2012-03-25 DIAGNOSIS — Z8679 Personal history of other diseases of the circulatory system: Secondary | ICD-10-CM | POA: Insufficient documentation

## 2012-03-25 DIAGNOSIS — K589 Irritable bowel syndrome without diarrhea: Secondary | ICD-10-CM | POA: Insufficient documentation

## 2012-03-25 DIAGNOSIS — Z8719 Personal history of other diseases of the digestive system: Secondary | ICD-10-CM | POA: Diagnosis not present

## 2012-03-25 DIAGNOSIS — I2510 Atherosclerotic heart disease of native coronary artery without angina pectoris: Secondary | ICD-10-CM | POA: Insufficient documentation

## 2012-03-25 DIAGNOSIS — R404 Transient alteration of awareness: Secondary | ICD-10-CM | POA: Diagnosis not present

## 2012-03-25 DIAGNOSIS — Z862 Personal history of diseases of the blood and blood-forming organs and certain disorders involving the immune mechanism: Secondary | ICD-10-CM | POA: Insufficient documentation

## 2012-03-25 DIAGNOSIS — K219 Gastro-esophageal reflux disease without esophagitis: Secondary | ICD-10-CM | POA: Insufficient documentation

## 2012-03-25 DIAGNOSIS — Z8673 Personal history of transient ischemic attack (TIA), and cerebral infarction without residual deficits: Secondary | ICD-10-CM | POA: Insufficient documentation

## 2012-03-25 DIAGNOSIS — R5381 Other malaise: Secondary | ICD-10-CM | POA: Insufficient documentation

## 2012-03-25 DIAGNOSIS — J811 Chronic pulmonary edema: Secondary | ICD-10-CM | POA: Diagnosis not present

## 2012-03-25 LAB — COMPREHENSIVE METABOLIC PANEL
Alkaline Phosphatase: 81 U/L (ref 39–117)
BUN: 14 mg/dL (ref 6–23)
Chloride: 101 mEq/L (ref 96–112)
Creatinine, Ser: 0.6 mg/dL (ref 0.50–1.10)
GFR calc Af Amer: >90 mL/min (ref >90)
Glucose, Bld: 113 mg/dL — ABNORMAL HIGH (ref 70–99)
Potassium: 4 mEq/L (ref 3.5–5.1)
Total Bilirubin: 0.3 mg/dL (ref 0.3–1.2)
Total Protein: 7.2 g/dL (ref 6.0–8.3)

## 2012-03-25 LAB — CBC WITH DIFFERENTIAL/PLATELET
Eosinophils Absolute: 0.1 10*3/uL (ref 0.0–0.7)
HCT: 41 % (ref 36.0–46.0)
Hemoglobin: 13.5 g/dL (ref 12.0–15.0)
Lymphs Abs: 1 10*3/uL (ref 0.7–4.0)
MCH: 29.5 pg (ref 26.0–34.0)
Monocytes Absolute: 0.7 10*3/uL (ref 0.1–1.0)
Monocytes Relative: 9 % (ref 3–12)
Neutrophils Relative %: 76 % (ref 43–77)
RBC: 4.57 MIL/uL (ref 3.87–5.11)

## 2012-03-25 MED ORDER — DILTIAZEM HCL 100 MG IV SOLR
5.0000 mg/h | INTRAVENOUS | Status: DC
  Administered 2012-03-25: 5 mg/h via INTRAVENOUS
  Filled 2012-03-25: qty 100

## 2012-03-25 MED ORDER — DILTIAZEM LOAD VIA INFUSION
15.0000 mg | Freq: Once | INTRAVENOUS | Status: AC
  Administered 2012-03-25: 15 mg via INTRAVENOUS
  Filled 2012-03-25: qty 15

## 2012-03-25 MED ORDER — DILTIAZEM LOAD VIA INFUSION: 15.0000 mg | Freq: Once | INTRAVENOUS | Status: DC

## 2012-03-25 NOTE — ED Notes (Signed)
Onset one day ago 2300 general weakness "funny feeling in chest" radiating to neck and heart rate irregular.  Denies chest pain.

## 2012-03-25 NOTE — ED Provider Notes (Signed)
History     CSN: [id number]  Arrival date & time 03/25/12  1007   First MD Initiated Contact with Patient 03/25/12 1010      Chief Complaint  Patient presents with  . Atrial Fibrillation    HPI Onset one day ago 2300 general weakness "funny feeling in chest" radiating to neck and heart rate irregular. Denies chest pain.  Denies chest pain, shortness of breath currently.  History of atrial fib in the past.  Denies diaphoresis  Past Medical History  Diagnosis Date  . Acute posthemorrhagic anemia 10/23/2009  . BACK PAIN, LUMBAR 09/27/2009  . CORONARY ARTERY DISEASE 11/25/2006  . DIVERTICULITIS, HX OF 11/25/2006  . FATIGUE 07/21/2008  . GERD 11/25/2006  . HYPERLIPIDEMIA 11/25/2006  . HYPERTENSION 11/25/2006  . IBS 12/07/2008  . MIGRAINE WITH AURA 08/13/2007  . Muscle weakness (generalized) 09/27/2009  . OSTEOARTHRITIS 08/13/2007  . PALPITATIONS, HX OF 01/04/2010  . TRANSIENT ISCHEMIC ATTACK 04/13/2007  . Hiatal hernia   . Atrial fibrillation     Past Surgical History  Procedure Date  . Coronary artery bypass graft   . Abdominal hysterectomy   . Tonsillectomy     No family history on file.  History  Substance Use Topics  . Smoking status: Never Smoker   . Smokeless tobacco: Never Used  . Alcohol Use: No    OB History    Grav Para Term Preterm Abortions TAB SAB Ect Mult Living                  Review of Systems All other systems reviewed and are ne Allergies  Hydrocodone; Ibuprofen; Oxycodone hcl; Prednisone; Statins; and Penicillins  Home Medications   Current Outpatient Rx  Name  Route  Sig  Dispense  Refill  . ASPIRIN 81 MG PO TABS   Oral   Take 1 tablet (81 mg total) by mouth daily.   30 tablet   6   . CALCIUM CARBONATE-VITAMIN D 500-200 MG-UNIT PO TABS   Oral   Take 1 tablet by mouth daily.         Marland Kitchen CLOPIDOGREL BISULFATE 75 MG PO TABS   Oral   Take 1 tablet (75 mg total) by mouth daily.   90 tablet   6   . COLESEVELAM HCL 625 MG PO TABS   Oral  Take 1,875 mg by mouth daily.         Marland Kitchen FLAXSEED OIL 1000 MG PO CAPS   Oral   Take 1,000 mg by mouth at bedtime.          Marland Kitchen LORAZEPAM 0.5 MG PO TABS   Oral   Take 0.5 mg by mouth every 8 (eight) hours. For anxiety/sleep         . METOPROLOL SUCCINATE ER 50 MG PO TB24   Oral   Take 50 mg by mouth daily. Take with or immediately following a meal.         . ONE-DAILY MULTI VITAMINS PO TABS   Oral   Take 1 tablet by mouth daily.           Marland Kitchen NIACIN ER 500 MG PO CPCR   Oral   Take 500 mg by mouth daily after supper.          Marland Kitchen FISH OIL 1000 MG PO CAPS   Oral   Take 1,000 mg by mouth 2 (two) times daily.          Marland Kitchen PANTOPRAZOLE SODIUM 40 MG PO TBEC  Oral   Take 40 mg by mouth every evening.          Marland Kitchen POLYETHYLENE GLYCOL 3350 PO PACK   Oral   Take 17 g by mouth daily as needed.          Marland Kitchen VITAMIN C 500 MG PO TABS   Oral   Take 500 mg by mouth daily.             BP 112/62  Pulse 70  Temp 98.2 F (36.8 C) (Oral)  Resp 11  SpO2 97%  Physical Exam  Nursing note and vitals reviewed. Constitutional: She is oriented to person, place, and time. She appears well-developed and well-nourished. No distress.  HENT:  Head: Normocephalic and atraumatic.  Eyes: Pupils are equal, round, and reactive to light.  Neck: Normal range of motion.  Cardiovascular: Intact distal pulses.   Pulmonary/Chest: No respiratory distress.  Abdominal: Normal appearance. She exhibits no distension.  Musculoskeletal: Normal range of motion.  Neurological: She is alert and oriented to person, place, and time. No cranial nerve deficit.  Skin: Skin is warm and dry. No rash noted.  Psychiatric: She has a normal mood and affect. Her behavior is normal.    ED Course  Procedures (including critical care time)    Medications  diltiazem (CARDIZEM) 100 mg in dextrose 5 % 100 mL infusion (5 mg/hr Intravenous New Bag/Given 03/25/12 1039)  metoprolol succinate (TOPROL-XL) 50 MG 24 hr  tablet (not administered)  LORazepam (ATIVAN) 0.5 MG tablet (not administered)  calcium-vitamin D (OSCAL WITH D) 500-200 MG-UNIT per tablet (not administered)  colesevelam (WELCHOL) 625 MG tablet (not administered)  diltiazem (CARDIZEM) 1 mg/mL load via infusion 15 mg (15 mg Intravenous New Bag/Given 03/25/12 1040)     EKG #1  Date: 03/25/2012  Rate: 126  Rhythm: Atrial fibrillation  QRS Axis: normal  Intervals: normal  ST/T Wave abnormalities: normal  Conduction Disutrbances: none  Narrative Interpretation: Abnormal EKG  EKG #2  Date: 03/25/2012  Rate: 71  Rhythm: normal sinus rhythm  QRS Axis: normal  Intervals: normal  ST/T Wave abnormalities: Borderline T wave  Conduction Disutrbances: none  Narrative Interpretation: Abnormal EKG.  Patient has converted from atrial fibrillation         Labs Reviewed  COMPREHENSIVE METABOLIC PANEL - Abnormal; Notable for the following:    Glucose, Bld 113 (*)     GFR calc non Af Amer 82 (*)     All other components within normal limits  CBC WITH DIFFERENTIAL  POCT I-STAT TROPONIN I   Dg Chest Portable 1 View  03/25/2012  *RADIOLOGY REPORT*  Clinical Data: Atrial fibrillation.  PORTABLE CHEST - 1 VIEW  Comparison: 10/16/2009.  Findings: CABG/median sternotomy.  There is no airspace disease or effusion.  Mild basilar atelectasis.  Cardiopericardial silhouette is within normal limits for size. Monitoring leads are projected over the chest.  Mild pulmonary vascular congestion is present with cephalization of pulmonary blood flow on this seated upright radiograph.  IMPRESSION:  1.  Postoperative changes of CABG. 2.  Pulmonary vascular congestion with cephalization of pulmonary blood flow.   Original Report Authenticated By: Andreas Newport, M.D.      1. Atrial fibrillation with rapid ventricular response       MDM  After treatment in the ED the patient feels back to baseline and wants to go home.         Nelia Shi,  MD 03/25/12 1210

## 2012-03-25 NOTE — ED Notes (Signed)
Pharmacy called to verified Cardizem, states will verified.

## 2012-03-30 ENCOUNTER — Encounter: Payer: Self-pay | Admitting: Internal Medicine

## 2012-03-30 ENCOUNTER — Ambulatory Visit (INDEPENDENT_AMBULATORY_CARE_PROVIDER_SITE_OTHER): Payer: Medicare Other | Admitting: Internal Medicine

## 2012-03-30 VITALS — BP 126/60 | HR 63 | Temp 98.3°F | Resp 18 | Wt 162.0 lb

## 2012-03-30 DIAGNOSIS — Z8679 Personal history of other diseases of the circulatory system: Secondary | ICD-10-CM | POA: Diagnosis not present

## 2012-03-30 DIAGNOSIS — I10 Essential (primary) hypertension: Secondary | ICD-10-CM

## 2012-03-30 DIAGNOSIS — I480 Paroxysmal atrial fibrillation: Secondary | ICD-10-CM

## 2012-03-30 DIAGNOSIS — I4891 Unspecified atrial fibrillation: Secondary | ICD-10-CM | POA: Diagnosis not present

## 2012-03-30 DIAGNOSIS — I2510 Atherosclerotic heart disease of native coronary artery without angina pectoris: Secondary | ICD-10-CM | POA: Diagnosis not present

## 2012-03-30 NOTE — Progress Notes (Signed)
Subjective:    Patient ID: Marissa Bowen, female    DOB: 1927-04-01, 78 y.o.   MRN: [id number]  HPI 78 year old patient who has a history of coronary artery disease. She has had remote CABG but no history of stenting. She also has a history of prior TIA and has been on a regimen of aspirin and Plavix. She has remote history of palpitations and has used a Holter monitor in the past. Last week she was diagnosed with atrial fibrillation and presented to the ED and placed on a Cardizem drip.  She converted to a normal sinus rhythm after approximately one hour and was discharged home. She remains on antiplatelet therapy only. She has a history of hypertension and age greater than 76 years of age with a CHADs  Score of at least 2.  She has had no palpitations for a number of years. Her cardiac status otherwise has been stable.  Past Medical History  Diagnosis Date  . Acute posthemorrhagic anemia 10/23/2009  . BACK PAIN, LUMBAR 09/27/2009  . CORONARY ARTERY DISEASE 11/25/2006  . DIVERTICULITIS, HX OF 11/25/2006  . FATIGUE 07/21/2008  . GERD 11/25/2006  . HYPERLIPIDEMIA 11/25/2006  . HYPERTENSION 11/25/2006  . IBS 12/07/2008  . MIGRAINE WITH AURA 08/13/2007  . Muscle weakness (generalized) 09/27/2009  . OSTEOARTHRITIS 08/13/2007  . PALPITATIONS, HX OF 01/04/2010  . TRANSIENT ISCHEMIC ATTACK 04/13/2007  . Hiatal hernia   . Atrial fibrillation     History   Social History  . Marital Status: Married    Spouse Name: N/A    Number of Children: N/A  . Years of Education: N/A   Occupational History  . Not on file.   Social History Main Topics  . Smoking status: Never Smoker   . Smokeless tobacco: Never Used  . Alcohol Use: No  . Drug Use: No  . Sexually Active: Not on file   Other Topics Concern  . Not on file   Social History Narrative  . No narrative on file    Past Surgical History  Procedure Date  . Coronary artery bypass graft   . Abdominal hysterectomy   . Tonsillectomy     No  family history on file.  Allergies  Allergen Reactions  . Hydrocodone     REACTION: migraines  . Ibuprofen Other (See Comments)    ulcers  . Oxycodone Hcl     REACTION: migraines  . Prednisone Other (See Comments)    ulcers  . Statins Other (See Comments)    Aches and pains  . Penicillins Swelling and Rash    Current Outpatient Prescriptions on File Prior to Visit  Medication Sig Dispense Refill  . aspirin 81 MG tablet Take 1 tablet (81 mg total) by mouth daily.  30 tablet  6  . calcium-vitamin D (OSCAL WITH D) 500-200 MG-UNIT per tablet Take 1 tablet by mouth daily.      . clopidogrel (PLAVIX) 75 MG tablet Take 1 tablet (75 mg total) by mouth daily.  90 tablet  6  . colesevelam (WELCHOL) 625 MG tablet Take 1,875 mg by mouth daily.      . Flaxseed, Linseed, (FLAXSEED OIL) 1000 MG CAPS Take 1,000 mg by mouth at bedtime.       Marland Kitchen LORazepam (ATIVAN) 0.5 MG tablet Take 0.5 mg by mouth every 8 (eight) hours. For anxiety/sleep      . metoprolol succinate (TOPROL-XL) 50 MG 24 hr tablet Take 50 mg by mouth daily. Take with or immediately following  a meal.      . Multiple Vitamin (MULTIVITAMIN) tablet Take 1 tablet by mouth daily.        . niacin 500 MG CR capsule Take 500 mg by mouth daily after supper.       . Omega-3 Fatty Acids (FISH OIL) 1000 MG CAPS Take 1,000 mg by mouth 2 (two) times daily.       . pantoprazole (PROTONIX) 40 MG tablet Take 40 mg by mouth every evening.       . polyethylene glycol (MIRALAX / GLYCOLAX) packet Take 17 g by mouth daily as needed.       . vitamin C (ASCORBIC ACID) 500 MG tablet Take 500 mg by mouth daily.        . [DISCONTINUED] sertraline (ZOLOFT) 25 MG tablet Take 1 tablet (25 mg total) by mouth daily.  30 tablet  2  . [DISCONTINUED] zolpidem (AMBIEN) 5 MG tablet Take 1 tablet (5 mg total) by mouth at bedtime as needed for sleep.  30 tablet  0    BP 126/60  Pulse 63  Temp 98.3 F (36.8 C) (Oral)  Resp 18  Wt 162 lb (73.483 kg)  SpO2  97%       Review of Systems  Constitutional: Negative.   HENT: Negative for hearing loss, congestion, sore throat, rhinorrhea, dental problem, sinus pressure and tinnitus.   Eyes: Negative for pain, discharge and visual disturbance.  Respiratory: Negative for cough and shortness of breath.   Cardiovascular: Positive for palpitations. Negative for chest pain and leg swelling.  Gastrointestinal: Negative for nausea, vomiting, abdominal pain, diarrhea, constipation, blood in stool and abdominal distention.  Genitourinary: Negative for dysuria, urgency, frequency, hematuria, flank pain, vaginal bleeding, vaginal discharge, difficulty urinating, vaginal pain and pelvic pain.  Musculoskeletal: Negative for joint swelling, arthralgias and gait problem.  Skin: Negative for rash.  Neurological: Negative for dizziness, syncope, speech difficulty, weakness, numbness and headaches.  Hematological: Negative for adenopathy.  Psychiatric/Behavioral: Negative for behavioral problems, dysphoric mood and agitation. The patient is not nervous/anxious.        Objective:   Physical Exam  Constitutional: She is oriented to person, place, and time. She appears well-developed and well-nourished.  HENT:  Head: Normocephalic.  Right Ear: External ear normal.  Left Ear: External ear normal.  Mouth/Throat: Oropharynx is clear and moist.  Eyes: Conjunctivae normal and EOM are normal. Pupils are equal, round, and reactive to light.  Neck: Normal range of motion. Neck supple. No thyromegaly present.  Cardiovascular: Normal rate, regular rhythm, normal heart sounds and intact distal pulses.   Pulmonary/Chest: Effort normal and breath sounds normal.  Abdominal: Soft. Bowel sounds are normal. She exhibits no mass. There is no tenderness.  Musculoskeletal: Normal range of motion.  Lymphadenopathy:    She has no cervical adenopathy.  Neurological: She is alert and oriented to person, place, and time.  Skin: Skin  is warm and dry. No rash noted.  Psychiatric: She has a normal mood and affect. Her behavior is normal.          Assessment & Plan:   Paroxysmal atrial fibrillation.  We'll obtain a 2-D echocardiogram the patient has had an ED  visit with recent lab and chest x-ray. Will check a TSH today for completeness. At the present time will maintain antiplatelet therapy and consult with cardiology about anticoagulation. Hypertension stable CAD status post remote CABG CVD history of TIA

## 2012-03-30 NOTE — Patient Instructions (Addendum)
Limit your sodium (Salt) intake  Cardiology followup as discussed  2-D echocardiogram

## 2012-04-02 DIAGNOSIS — N8182 Incompetence or weakening of pubocervical tissue: Secondary | ICD-10-CM | POA: Diagnosis not present

## 2012-04-03 ENCOUNTER — Ambulatory Visit (HOSPITAL_COMMUNITY): Payer: Medicare Other | Attending: Cardiology | Admitting: Radiology

## 2012-04-03 DIAGNOSIS — I369 Nonrheumatic tricuspid valve disorder, unspecified: Secondary | ICD-10-CM | POA: Insufficient documentation

## 2012-04-03 DIAGNOSIS — I10 Essential (primary) hypertension: Secondary | ICD-10-CM | POA: Insufficient documentation

## 2012-04-03 DIAGNOSIS — I379 Nonrheumatic pulmonary valve disorder, unspecified: Secondary | ICD-10-CM | POA: Insufficient documentation

## 2012-04-03 DIAGNOSIS — I4891 Unspecified atrial fibrillation: Secondary | ICD-10-CM | POA: Diagnosis not present

## 2012-04-03 DIAGNOSIS — I2510 Atherosclerotic heart disease of native coronary artery without angina pectoris: Secondary | ICD-10-CM | POA: Diagnosis not present

## 2012-04-03 DIAGNOSIS — I080 Rheumatic disorders of both mitral and aortic valves: Secondary | ICD-10-CM | POA: Insufficient documentation

## 2012-04-03 NOTE — Progress Notes (Signed)
Echocardiogram performed.  

## 2012-04-07 ENCOUNTER — Telehealth: Payer: Self-pay | Admitting: Cardiology

## 2012-04-07 NOTE — Telephone Encounter (Signed)
I spoke with the pt and she is scheduled as a new patient to see Dr Swaziland on 04/20/12.  The pt is confused as to why she is scheduled to see Dr Swaziland when she is Dr Rosalyn Charters patient. I have fixed the pt's appointments. The pt denies any further AFIB since her ER visit.  The pt did have an echo done on 04/03/12 and I gave her the preliminary results.  The pt is scheduled to see Dr Riley Kill on 05/07/11. I will forward this information to Dr Riley Kill for review.

## 2012-04-07 NOTE — Telephone Encounter (Signed)
plz return call to pt [phone] regarding questions about medical care, test results, and physician care issues. She does want  to see Dr. Swaziland

## 2012-04-20 ENCOUNTER — Ambulatory Visit: Payer: Medicare Other | Admitting: Cardiology

## 2012-05-06 ENCOUNTER — Ambulatory Visit (INDEPENDENT_AMBULATORY_CARE_PROVIDER_SITE_OTHER): Payer: Medicare Other | Admitting: Cardiology

## 2012-05-06 ENCOUNTER — Telehealth: Payer: Self-pay | Admitting: Cardiology

## 2012-05-06 ENCOUNTER — Encounter: Payer: Self-pay | Admitting: Cardiology

## 2012-05-06 VITALS — BP 140/70 | HR 63 | Ht 68.0 in | Wt 162.0 lb

## 2012-05-06 DIAGNOSIS — D62 Acute posthemorrhagic anemia: Secondary | ICD-10-CM | POA: Diagnosis not present

## 2012-05-06 DIAGNOSIS — I2510 Atherosclerotic heart disease of native coronary artery without angina pectoris: Secondary | ICD-10-CM

## 2012-05-06 DIAGNOSIS — G459 Transient cerebral ischemic attack, unspecified: Secondary | ICD-10-CM | POA: Diagnosis not present

## 2012-05-06 DIAGNOSIS — I4891 Unspecified atrial fibrillation: Secondary | ICD-10-CM

## 2012-05-06 DIAGNOSIS — I480 Paroxysmal atrial fibrillation: Secondary | ICD-10-CM

## 2012-05-06 MED ORDER — APIXABAN 5 MG PO TABS: 5.0000 mg | ORAL_TABLET | Freq: Two times a day (BID) | ORAL | Status: DC

## 2012-05-06 NOTE — Patient Instructions (Addendum)
Stop PLAVIX Start Eliquis 5 mg 2 times per day on 05/08/2012  Return to see Dr.Stuckey in 2 weeks.

## 2012-05-06 NOTE — Telephone Encounter (Signed)
Called patient and spoke with husband. She was out shopping. He will pass message to her to stay on baby ASA per Dr.Stuckey.

## 2012-05-06 NOTE — Telephone Encounter (Signed)
Pt was in today and prescribed eliquis, does she need to continue to take baby asa with it?

## 2012-05-07 ENCOUNTER — Telehealth: Payer: Self-pay | Admitting: Cardiology

## 2012-05-07 NOTE — Telephone Encounter (Signed)
[phone] pt calling re some questions re eliquis that she was prescribed yesterday

## 2012-05-07 NOTE — Telephone Encounter (Signed)
Pt wants Dr. Riley Kill to be aware that her pharmacist called her and advised her to take Welchol and Eliquis 1 hour apart as Welchol will absorb the Eliquis which would defeat the benefits. Note forwarded to Dr. Riley Kill per pts request.

## 2012-05-09 NOTE — Telephone Encounter (Signed)
Patient seen in follow up. TS

## 2012-05-09 NOTE — Progress Notes (Signed)
HPI:  The patient is in for follow up today.  She was seen in the ER for atrial fib.  She converted promptly, and decided she wanted to go home.  She had a stressful day that day.  She has followed up with Dr. Kirtland Bouchard in the office.  She is doing better.  We had an extensive discussion today regarding her situation. We discussed her stroke risk today, which is a CHADS 2 vasc score of 6, or 9.8%, and a HAS BLED score with bleeding risk of 1.88%.  We made changes in her meds based on this.  She has had prior CVA with documented ischemic stroke.  No recent stroke related symptoms.  She has been on DAPT.  Echo reviewed today as well.     Current Outpatient Prescriptions  Medication Sig Dispense Refill  . aspirin 81 MG tablet Take 1 tablet (81 mg total) by mouth daily.  30 tablet  6  . calcium-vitamin D (OSCAL WITH D) 500-200 MG-UNIT per tablet Take 1 tablet by mouth daily.      . clopidogrel (PLAVIX) 75 MG tablet Take 1 tablet (75 mg total) by mouth daily.  90 tablet  6  . colesevelam (WELCHOL) 625 MG tablet Take 1,875 mg by mouth daily.      . Flaxseed, Linseed, (FLAXSEED OIL) 1000 MG CAPS Take 1,000 mg by mouth at bedtime.       Marland Kitchen LORazepam (ATIVAN) 0.5 MG tablet Take 0.5 mg by mouth every 8 (eight) hours. For anxiety/sleep      . metoprolol succinate (TOPROL-XL) 50 MG 24 hr tablet Take 50 mg by mouth daily. Take with or immediately following a meal.      . Multiple Vitamin (MULTIVITAMIN) tablet Take 1 tablet by mouth daily.        . niacin 500 MG CR capsule Take 500 mg by mouth daily after supper.       . Omega-3 Fatty Acids (FISH OIL) 1000 MG CAPS Take 1,000 mg by mouth 2 (two) times daily.       . pantoprazole (PROTONIX) 40 MG tablet Take 40 mg by mouth every evening.       . polyethylene glycol (MIRALAX / GLYCOLAX) packet Take 17 g by mouth daily as needed.       . vitamin C (ASCORBIC ACID) 500 MG tablet Take 500 mg by mouth daily.        Marland Kitchen apixaban (ELIQUIS) 5 MG TABS tablet Take 1 tablet (5 mg  total) by mouth 2 (two) times daily.  60 tablet  1  . [DISCONTINUED] sertraline (ZOLOFT) 25 MG tablet Take 1 tablet (25 mg total) by mouth daily.  30 tablet  2  . [DISCONTINUED] zolpidem (AMBIEN) 5 MG tablet Take 1 tablet (5 mg total) by mouth at bedtime as needed for sleep.  30 tablet  0    Allergies  Allergen Reactions  . Hydrocodone     REACTION: migraines  . Ibuprofen Other (See Comments)    ulcers  . Oxycodone Hcl     REACTION: migraines  . Prednisone Other (See Comments)    ulcers  . Statins Other (See Comments)    Aches and pains  . Penicillins Swelling and Rash    Past Medical History  Diagnosis Date  . Acute posthemorrhagic anemia 10/23/2009  . BACK PAIN, LUMBAR 09/27/2009  . CORONARY ARTERY DISEASE 11/25/2006  . DIVERTICULITIS, HX OF 11/25/2006  . FATIGUE 07/21/2008  . GERD 11/25/2006  . HYPERLIPIDEMIA 11/25/2006  .  HYPERTENSION 11/25/2006  . IBS 12/07/2008  . MIGRAINE WITH AURA 08/13/2007  . Muscle weakness (generalized) 09/27/2009  . OSTEOARTHRITIS 08/13/2007  . PALPITATIONS, HX OF 01/04/2010  . TRANSIENT ISCHEMIC ATTACK 04/13/2007  . Hiatal hernia   . Atrial fibrillation     Past Surgical History  Procedure Date  . Coronary artery bypass graft   . Abdominal hysterectomy   . Tonsillectomy     No family history on file.  History   Social History  . Marital Status: Married    Spouse Name: N/A    Number of Children: N/A  . Years of Education: N/A   Occupational History  . Not on file.   Social History Main Topics  . Smoking status: Never Smoker   . Smokeless tobacco: Never Used  . Alcohol Use: No  . Drug Use: No  . Sexually Active: Not on file   Other Topics Concern  . Not on file   Social History Narrative  . No narrative on file    ROS: Please see the HPI.  All other systems reviewed and negative.  PHYSICAL EXAM:  BP 140/70  Pulse 63  Ht 5\' 8"  (1.727 m)  Wt 162 lb (73.483 kg)  BMI 24.63 kg/m2  General: Well developed, well nourished, in  no acute distress. Head:  Normocephalic and atraumatic. Neck: no JVD Lungs: Clear to auscultation and percussion. Heart: Normal S1 and S2.  No murmur, rubs or gallops.  Abdomen:  Normal bowel sounds; soft; non tender; no organomegaly Pulses: Pulses normal in all 4 extremities. Extremities: No clubbing or cyanosis. No edema. Neurologic: Alert and oriented x 3.  EKG:  NSR.  Delay in R wave progression.    ECHO:  Study Conclusions  - Left ventricle: The cavity size was normal. Wall thickness was increased in a pattern of mild LVH. Systolic function was normal. The estimated ejection fraction was in the range of 60% to 65%. Wall motion was normal; there were no regional wall motion abnormalities. Features are consistent with a pseudonormal left ventricular filling pattern, with concomitant abnormal relaxation and increased filling pressure (grade 2 diastolic dysfunction). - Aortic valve: There was no stenosis. Trivial regurgitation. - Mitral valve: Trivial regurgitation. - Left atrium: The atrium was mildly dilated. - Right ventricle: The cavity size was normal. Systolic function was normal. - Tricuspid valve: Mild-moderate regurgitation. Peak RV-RA gradient: 32mm Hg (S). - Pulmonary arteries: PA peak pressure: 37mm Hg (S). - Inferior vena cava: The vessel was normal in size; the respirophasic diameter changes were in the normal range (= 50%); findings are consistent with normal central venous pressure. Impressions:  - Normal LV size with mild LV hypertrophy. EF 60-65%. Moderate diastolic dysfunction. Normal RV size and systolic function. Borderline pulmonary hypertension. Biatrial enlargement.     ASSESSMENT AND PLAN:

## 2012-05-09 NOTE — Telephone Encounter (Signed)
Noted.  Agree with pharmacy recommendations. TS

## 2012-05-11 NOTE — Assessment & Plan Note (Signed)
No recurrent symptoms.

## 2012-05-11 NOTE — Assessment & Plan Note (Signed)
The patient has had atrial fib, and also has had prior stroke.  In addition, had upper GI bleed with antral ulcer in 2011.  I am concerned about her situation, but think she is at high risk for thromboembolic events.  Her CHADS 2 score is 6, and though she has had a prior bleed, I think we need to proceed with DC of plavix, and add Eliquis.  We will need to follow her closely for now, and I will continue to monitor her carefully.  Most recent Hgb was 13 with a normal MCV.  I will see her back promptly.

## 2012-05-11 NOTE — Assessment & Plan Note (Signed)
Occurred previously due to antral ulcer.  Will continue to monitor her carefully.

## 2012-05-11 NOTE — Assessment & Plan Note (Signed)
Has prior event.  High risk for recurrence with PAF.

## 2012-05-12 ENCOUNTER — Ambulatory Visit (INDEPENDENT_AMBULATORY_CARE_PROVIDER_SITE_OTHER): Payer: Medicare Other | Admitting: Internal Medicine

## 2012-05-12 ENCOUNTER — Encounter: Payer: Self-pay | Admitting: Internal Medicine

## 2012-05-12 VITALS — BP 120/62 | HR 66 | Temp 98.4°F | Resp 18 | Wt 162.0 lb

## 2012-05-12 DIAGNOSIS — I2510 Atherosclerotic heart disease of native coronary artery without angina pectoris: Secondary | ICD-10-CM | POA: Diagnosis not present

## 2012-05-12 DIAGNOSIS — G459 Transient cerebral ischemic attack, unspecified: Secondary | ICD-10-CM

## 2012-05-12 DIAGNOSIS — I4891 Unspecified atrial fibrillation: Secondary | ICD-10-CM

## 2012-05-12 DIAGNOSIS — I480 Paroxysmal atrial fibrillation: Secondary | ICD-10-CM

## 2012-05-12 DIAGNOSIS — K250 Acute gastric ulcer with hemorrhage: Secondary | ICD-10-CM

## 2012-05-12 DIAGNOSIS — I10 Essential (primary) hypertension: Secondary | ICD-10-CM

## 2012-05-12 NOTE — Progress Notes (Signed)
Subjective:    Patient ID: Marissa Bowen, female    DOB: 30-Jul-1926, 79 y.o.   MRN: [id number]  HPI  79 year old patient who has a history of paroxysmal atrial fibrillation. She is now on chronic anticoagulation due  to high stroke risk; aspirin and Plavix has been discontinued. She is doing quite well. Did see cardiology last week. She does have a history of prior antral ulceration with upper GI bleeding. She has been on chronic PPI gastrocyto-protection  Past Medical History  Diagnosis Date  . Acute posthemorrhagic anemia 10/23/2009  . BACK PAIN, LUMBAR 09/27/2009  . CORONARY ARTERY DISEASE 11/25/2006  . DIVERTICULITIS, HX OF 11/25/2006  . FATIGUE 07/21/2008  . GERD 11/25/2006  . HYPERLIPIDEMIA 11/25/2006  . HYPERTENSION 11/25/2006  . IBS 12/07/2008  . MIGRAINE WITH AURA 08/13/2007  . Muscle weakness (generalized) 09/27/2009  . OSTEOARTHRITIS 08/13/2007  . PALPITATIONS, HX OF 01/04/2010  . TRANSIENT ISCHEMIC ATTACK 04/13/2007  . Hiatal hernia   . Atrial fibrillation     History   Social History  . Marital Status: Married    Spouse Name: N/A    Number of Children: N/A  . Years of Education: N/A   Occupational History  . Not on file.   Social History Main Topics  . Smoking status: Never Smoker   . Smokeless tobacco: Never Used  . Alcohol Use: No  . Drug Use: No  . Sexually Active: Not on file   Other Topics Concern  . Not on file   Social History Narrative  . No narrative on file    Past Surgical History  Procedure Date  . Coronary artery bypass graft   . Abdominal hysterectomy   . Tonsillectomy     No family history on file.  Allergies  Allergen Reactions  . Hydrocodone     REACTION: migraines  . Ibuprofen Other (See Comments)    ulcers  . Oxycodone Hcl     REACTION: migraines  . Prednisone Other (See Comments)    ulcers  . Statins Other (See Comments)    Aches and pains  . Penicillins Swelling and Rash    Current Outpatient Prescriptions on File Prior to  Visit  Medication Sig Dispense Refill  . apixaban (ELIQUIS) 5 MG TABS tablet Take 1 tablet (5 mg total) by mouth 2 (two) times daily.  60 tablet  1  . aspirin 81 MG tablet Take 1 tablet (81 mg total) by mouth daily.  30 tablet  6  . calcium-vitamin D (OSCAL WITH D) 500-200 MG-UNIT per tablet Take 1 tablet by mouth daily.      . colesevelam (WELCHOL) 625 MG tablet Take 1,875 mg by mouth daily.      . Flaxseed, Linseed, (FLAXSEED OIL) 1000 MG CAPS Take 1,000 mg by mouth at bedtime.       Marland Kitchen LORazepam (ATIVAN) 0.5 MG tablet Take 0.5 mg by mouth every 8 (eight) hours. For anxiety/sleep      . metoprolol succinate (TOPROL-XL) 50 MG 24 hr tablet Take 50 mg by mouth daily. Take with or immediately following a meal.      . Multiple Vitamin (MULTIVITAMIN) tablet Take 1 tablet by mouth daily.        . niacin 500 MG CR capsule Take 500 mg by mouth daily after supper.       . Omega-3 Fatty Acids (FISH OIL) 1000 MG CAPS Take 1,000 mg by mouth 2 (two) times daily.       . pantoprazole (PROTONIX)  40 MG tablet Take 40 mg by mouth every evening.       . polyethylene glycol (MIRALAX / GLYCOLAX) packet Take 17 g by mouth daily as needed.       . vitamin C (ASCORBIC ACID) 500 MG tablet Take 500 mg by mouth daily.        . [DISCONTINUED] sertraline (ZOLOFT) 25 MG tablet Take 1 tablet (25 mg total) by mouth daily.  30 tablet  2  . [DISCONTINUED] zolpidem (AMBIEN) 5 MG tablet Take 1 tablet (5 mg total) by mouth at bedtime as needed for sleep.  30 tablet  0    BP 120/62  Pulse 66  Temp 98.4 F (36.9 C) (Oral)  Resp 18  Wt 162 lb (73.483 kg)  SpO2 97%      Review of Systems  Constitutional: Negative.   HENT: Negative for hearing loss, congestion, sore throat, rhinorrhea, dental problem, sinus pressure and tinnitus.   Eyes: Negative for pain, discharge and visual disturbance.  Respiratory: Negative for cough and shortness of breath.   Cardiovascular: Negative for chest pain, palpitations and leg swelling.    Gastrointestinal: Negative for nausea, vomiting, abdominal pain, diarrhea, constipation, blood in stool and abdominal distention.  Genitourinary: Negative for dysuria, urgency, frequency, hematuria, flank pain, vaginal bleeding, vaginal discharge, difficulty urinating, vaginal pain and pelvic pain.  Musculoskeletal: Negative for joint swelling, arthralgias and gait problem.  Skin: Negative for rash.  Neurological: Negative for dizziness, syncope, speech difficulty, weakness, numbness and headaches.  Hematological: Negative for adenopathy.  Psychiatric/Behavioral: Negative for behavioral problems, dysphoric mood and agitation. The patient is not nervous/anxious.        Objective:   Physical Exam  Constitutional: She is oriented to person, place, and time. She appears well-developed and well-nourished.  HENT:  Head: Normocephalic.  Right Ear: External ear normal.  Left Ear: External ear normal.  Mouth/Throat: Oropharynx is clear and moist.  Eyes: Conjunctivae normal and EOM are normal. Pupils are equal, round, and reactive to light.  Neck: Normal range of motion. Neck supple. No thyromegaly present.  Cardiovascular: Normal rate, regular rhythm, normal heart sounds and intact distal pulses.   Pulmonary/Chest: Effort normal and breath sounds normal.  Abdominal: Soft. Bowel sounds are normal. She exhibits no mass. There is no tenderness.  Musculoskeletal: Normal range of motion.  Lymphadenopathy:    She has no cervical adenopathy.  Neurological: She is alert and oriented to person, place, and time.  Skin: Skin is warm and dry. No rash noted.  Psychiatric: She has a normal mood and affect. Her behavior is normal.          Assessment & Plan:   Paroxysmal atrial fibrillation.  Continue  chronic anticoagulation and PPI cyto-protection Hypertension stable  Recheck 4 months and

## 2012-05-12 NOTE — Patient Instructions (Signed)
Limit your sodium (Salt) intake  Return in 4 months for follow-up  

## 2012-05-13 ENCOUNTER — Telehealth: Payer: Self-pay | Admitting: Cardiology

## 2012-05-13 NOTE — Telephone Encounter (Signed)
Pt talked with you yesterday and has more  questions , pls call

## 2012-05-13 NOTE — Telephone Encounter (Signed)
I spoke with the pt and she is anxious because she is taking Eliquis and has a history of GI bleeding. The pt is nervous because she does not want to bleed again.  I spoke with the pt at length about the risk involved with AFib and not taking anticoagulation vs AFib with anticoagulation. The pt is at high risk for stroke if she is not on anticoagulant. The pt is aware of the symptoms that she need to monitor for in regards to bleeding. After our discussion the pt has a better understanding of the purpose of taking Eliquis.

## 2012-05-14 ENCOUNTER — Telehealth: Payer: Self-pay | Admitting: Internal Medicine

## 2012-05-14 ENCOUNTER — Other Ambulatory Visit: Payer: Self-pay | Admitting: Internal Medicine

## 2012-05-14 MED ORDER — PROMETHAZINE HCL 25 MG PO TABS: 25.0000 mg | ORAL_TABLET | Freq: Four times a day (QID) | ORAL | Status: DC | PRN

## 2012-05-14 MED ORDER — LORAZEPAM 0.5 MG PO TABS: 0.5000 mg | ORAL_TABLET | Freq: Three times a day (TID) | ORAL | Status: DC

## 2012-05-14 NOTE — Telephone Encounter (Signed)
Spoke to pt told her Dr. Amador Cunas said to try Phenergan 25 mg tablet for nausea. If nausea and pain continue need to be seen. Pt verbalized understanding and stated needs refill on Lorazepam also. Told pt will call both Rx's into pharmacy. Pt verbalized understanding. Rx's called into pharmacy spoke to [place].

## 2012-05-14 NOTE — Telephone Encounter (Signed)
Spoke to pt c/o nausea and abdominal pain in middle of night past few months off and on, feels like she is going to vomit. Pt wants to know what is safe to take with new drug Eliquis? Told pt will send message to Dr. Amador Cunas and get back to her. Pt verbalized understanding.

## 2012-05-14 NOTE — Telephone Encounter (Signed)
Patient Information:  Caller Name: Wilmoth  Phone: [phone]  Patient: Marissa Bowen  Gender: Female  DOB: 1926/05/18  Age: 79 Years  PCP: Eleonore Chiquito [facility])  Office Follow Up:  Does the office need to follow up with this patient?: Yes  Instructions For The Office: Caller requesting a call from Dr. Charm Rings nurse regarding questions about Eliquis.  Thank You.   Symptoms  Reason For Call & Symptoms: Call wanting to speak with Provider or his nurse regarding the medicine Eliquis.  Pt has says she is living on a fear of bleeding.  Pt states she was in the office 05/12/13.  No sxs to report. Pt says his nurse will know what she is talking about and wants her to caller her.  Reviewed Health History In EMR: No  Reviewed Medications In EMR: No  Reviewed Allergies In EMR: No  Reviewed Surgeries / Procedures: No  Date of Onset of Symptoms: 05/14/2012  Guideline(s) Used:  No Protocol Available - Information Only  Disposition Per Guideline:   Discuss with PCP and Callback by Nurse Today  Reason For Disposition Reached:   Nursing judgment  Advice Given:  N/A

## 2012-05-14 NOTE — Telephone Encounter (Signed)
Patient Information: ° Caller Name: Dangela ° Phone: [phone] ° Patient: Arreguin, Shaima ° Gender: Female ° DOB: 06/09/1926 ° Age: 79 Years ° PCP: Kwiatkowski, Peter (Family Practice) ° °Office Follow Up: ° Does the office need to follow up with this patient?: Yes ° Instructions For The Office: Caller requesting a call from Dr. K's nurse regarding questions about Eliquis.  Thank You. ° ° °Symptoms ° Reason For Call & Symptoms: Call wanting to speak with Provider or his nurse regarding the medicine Eliquis.  Pt has says she is living on a fear of bleeding.  °Pt states she was in the office 05/12/13.  No sxs to report. Pt says his nurse will know what she is talking about and wants her to caller her. ° Reviewed Health History In EMR: No ° Reviewed Medications In EMR: No ° Reviewed Allergies In EMR: No ° Reviewed Surgeries / Procedures: No ° Date of Onset of Symptoms: 05/14/2012 ° °Guideline(s) Used: ° No Protocol Available - Information Only ° °Disposition Per Guideline:  ° Discuss with PCP and Callback by Nurse Today ° °Reason For Disposition Reached:  ° Nursing judgment ° °Advice Given: ° N/A ° ° °

## 2012-05-14 NOTE — Telephone Encounter (Signed)
A prescription for Phenergan 25 mg #30 to take every 6 hours as needed for nausea

## 2012-05-14 NOTE — Addendum Note (Signed)
Addended by: Jimmye Norman on: 05/14/2012 05:12 PM   Modules accepted: Orders

## 2012-05-15 ENCOUNTER — Telehealth: Payer: Self-pay | Admitting: Internal Medicine

## 2012-05-15 MED ORDER — LORAZEPAM 0.5 MG PO TABS: 0.5000 mg | ORAL_TABLET | Freq: Three times a day (TID) | ORAL | Status: DC

## 2012-05-15 NOTE — Telephone Encounter (Signed)
Called pharmacy back they stated pt usually gets 90 day supply. Told them okay to dispense 90 with 2 refills, and cancel 30 day Rx.

## 2012-05-15 NOTE — Telephone Encounter (Signed)
Pharm has a ? About lorazepam

## 2012-05-15 NOTE — Telephone Encounter (Signed)
I called the patient and reviewed with her in detail the decision to recommend Eliquis to her, and shared with her my concerns both about the risk of bleeding and the risk of stroke.  I assured her that I had reviewed her records detailing her prior GI bleed, and told her I was concerned about this as well.  I also shared my concerns regarding her stroke risk given prior stroke, and documented rhythm abnormality.  We had a thorough discussion and agree to keep very careful follow up with her.  She is also to report any problems.

## 2012-05-25 DIAGNOSIS — N8182 Incompetence or weakening of pubocervical tissue: Secondary | ICD-10-CM | POA: Diagnosis not present

## 2012-05-28 ENCOUNTER — Ambulatory Visit: Payer: Medicare Other | Admitting: Internal Medicine

## 2012-06-03 ENCOUNTER — Telehealth: Payer: Self-pay | Admitting: Cardiology

## 2012-06-03 MED ORDER — APIXABAN 5 MG PO TABS: 5.0000 mg | ORAL_TABLET | Freq: Two times a day (BID) | ORAL | Status: DC

## 2012-06-03 NOTE — Telephone Encounter (Signed)
New Problem    Pt just stated she needed to discuss a medication concern with nurse.

## 2012-06-03 NOTE — Telephone Encounter (Signed)
I spoke with the pt and she needs refills on her Eliquis.  I have sent Rx to pharmacy. The pt will see Dr Riley Kill on 06/05/12.

## 2012-06-04 ENCOUNTER — Other Ambulatory Visit: Payer: Self-pay | Admitting: *Deleted

## 2012-06-05 ENCOUNTER — Ambulatory Visit (INDEPENDENT_AMBULATORY_CARE_PROVIDER_SITE_OTHER): Payer: Medicare Other | Admitting: Cardiology

## 2012-06-05 ENCOUNTER — Encounter: Payer: Self-pay | Admitting: Cardiology

## 2012-06-05 VITALS — BP 158/70 | HR 55 | Ht 68.0 in | Wt 162.0 lb

## 2012-06-05 DIAGNOSIS — I2510 Atherosclerotic heart disease of native coronary artery without angina pectoris: Secondary | ICD-10-CM | POA: Diagnosis not present

## 2012-06-05 DIAGNOSIS — G459 Transient cerebral ischemic attack, unspecified: Secondary | ICD-10-CM

## 2012-06-05 DIAGNOSIS — E7800 Pure hypercholesterolemia, unspecified: Secondary | ICD-10-CM | POA: Diagnosis not present

## 2012-06-05 DIAGNOSIS — I4891 Unspecified atrial fibrillation: Secondary | ICD-10-CM | POA: Diagnosis not present

## 2012-06-05 DIAGNOSIS — I480 Paroxysmal atrial fibrillation: Secondary | ICD-10-CM

## 2012-06-05 LAB — CBC WITH DIFFERENTIAL/PLATELET
Basophils Absolute: 0 10*3/uL (ref 0.0–0.1)
Eosinophils Relative: 2.9 % (ref 0.0–5.0)
Hemoglobin: 11.1 g/dL — ABNORMAL LOW (ref 12.0–15.0)
Lymphocytes Relative: 24.5 % (ref 12.0–46.0)
Monocytes Relative: 9.8 % (ref 3.0–12.0)
Neutro Abs: 4.2 10*3/uL (ref 1.4–7.7)
RBC: 3.8 Mil/uL — ABNORMAL LOW (ref 3.87–5.11)
RDW: 13.7 % (ref 11.5–14.6)
WBC: 6.7 10*3/uL (ref 4.5–10.5)

## 2012-06-05 LAB — LIPID PANEL
Cholesterol: 152 mg/dL (ref 0–200)
HDL: 53.1 mg/dL (ref >39.00)
LDL Cholesterol: 84 mg/dL (ref 0–99)
Triglycerides: 74 mg/dL (ref 0.0–149.0)
VLDL: 14.8 mg/dL (ref 0.0–40.0)

## 2012-06-05 LAB — HEPATIC FUNCTION PANEL
Albumin: 3.8 g/dL (ref 3.5–5.2)
Alkaline Phosphatase: 70 U/L (ref 39–117)
Total Protein: 7 g/dL (ref 6.0–8.3)

## 2012-06-05 NOTE — Progress Notes (Signed)
HPI:  Patient is in for followup. She generally has been stable since starting on antithrombin therapy for stroke protection. We had a thorough discussion about this today. She denies any significant chest pain at the present time.  Current Outpatient Prescriptions  Medication Sig Dispense Refill  . apixaban (ELIQUIS) 5 MG TABS tablet Take 1 tablet (5 mg total) by mouth 2 (two) times daily.  60 tablet  6  . aspirin 81 MG tablet Take 1 tablet (81 mg total) by mouth daily.  30 tablet  6  . calcium-vitamin D (OSCAL WITH D) 500-200 MG-UNIT per tablet Take 1 tablet by mouth daily.      . colesevelam (WELCHOL) 625 MG tablet Take 1,875 mg by mouth daily.      . Flaxseed, Linseed, (FLAXSEED OIL) 1000 MG CAPS Take 1,000 mg by mouth at bedtime.       Marland Kitchen LORazepam (ATIVAN) 0.5 MG tablet Take 1 tablet (0.5 mg total) by mouth every 8 (eight) hours. For anxiety/sleep  90 tablet  2  . metoprolol succinate (TOPROL-XL) 50 MG 24 hr tablet Take 50 mg by mouth daily. Take with or immediately following a meal.      . Multiple Vitamin (MULTIVITAMIN) tablet Take 1 tablet by mouth daily.        . niacin 500 MG CR capsule Take 500 mg by mouth daily after supper.       . Omega-3 Fatty Acids (FISH OIL) 1000 MG CAPS Take 1,000 mg by mouth 2 (two) times daily.       . pantoprazole (PROTONIX) 40 MG tablet Take 40 mg by mouth every evening.       . polyethylene glycol (MIRALAX / GLYCOLAX) packet Take 17 g by mouth daily as needed.       . promethazine (PHENERGAN) 25 MG tablet Take 1 tablet (25 mg total) by mouth every 6 (six) hours as needed for nausea.  30 tablet  0  . vitamin C (ASCORBIC ACID) 500 MG tablet Take 500 mg by mouth daily.        . [DISCONTINUED] sertraline (ZOLOFT) 25 MG tablet Take 1 tablet (25 mg total) by mouth daily.  30 tablet  2  . [DISCONTINUED] zolpidem (AMBIEN) 5 MG tablet Take 1 tablet (5 mg total) by mouth at bedtime as needed for sleep.  30 tablet  0    Allergies  Allergen Reactions  .  Hydrocodone     REACTION: migraines  . Ibuprofen Other (See Comments)    ulcers  . Oxycodone Hcl     REACTION: migraines  . Prednisone Other (See Comments)    ulcers  . Statins Other (See Comments)    Aches and pains  . Penicillins Swelling and Rash    Past Medical History  Diagnosis Date  . Acute posthemorrhagic anemia 10/23/2009  . BACK PAIN, LUMBAR 09/27/2009  . CORONARY ARTERY DISEASE 11/25/2006  . DIVERTICULITIS, HX OF 11/25/2006  . FATIGUE 07/21/2008  . GERD 11/25/2006  . HYPERLIPIDEMIA 11/25/2006  . HYPERTENSION 11/25/2006  . IBS 12/07/2008  . MIGRAINE WITH AURA 08/13/2007  . Muscle weakness (generalized) 09/27/2009  . OSTEOARTHRITIS 08/13/2007  . PALPITATIONS, HX OF 01/04/2010  . TRANSIENT ISCHEMIC ATTACK 04/13/2007  . Hiatal hernia   . Atrial fibrillation     Past Surgical History  Procedure Date  . Coronary artery bypass graft   . Abdominal hysterectomy   . Tonsillectomy     No family history on file.  History   Social History  .  Marital Status: Married    Spouse Name: N/A    Number of Children: N/A  . Years of Education: N/A   Occupational History  . Not on file.   Social History Main Topics  . Smoking status: Never Smoker   . Smokeless tobacco: Never Used  . Alcohol Use: No  . Drug Use: No  . Sexually Active: Not on file   Other Topics Concern  . Not on file   Social History Narrative  . No narrative on file    ROS: Please see the HPI.  All other systems reviewed and negative.  PHYSICAL EXAM:  BP 158/70  Pulse 55  Ht 5\' 8"  (1.727 m)  Wt 162 lb (73.483 kg)  BMI 24.63 kg/m2  SpO2 97%  General: Well developed, well nourished, in no acute distress. Head:  Normocephalic and atraumatic. Neck: no JVD Lungs: Clear to auscultation and percussion. Heart: Normal S1 and S2.  No murmur, rubs or gallops.  Pulses: Pulses normal in all 4 extremities. Extremities: No clubbing or cyanosis. No edema. Neurologic: Alert and oriented x 3.  EKG:  SB.   Otherwise normal.    ASSESSMENT AND PLAN:

## 2012-06-05 NOTE — Patient Instructions (Addendum)
Your physician recommends that you schedule a follow-up appointment with Dr Riley Kill.  Your physician recommends that you have lab work today: CBC, LIPID and LIVER  Your physician recommends that you continue on your current medications as directed. Please refer to the Current Medication list given to you today.

## 2012-06-08 ENCOUNTER — Emergency Department (HOSPITAL_COMMUNITY): Payer: Medicare Other

## 2012-06-08 ENCOUNTER — Observation Stay (HOSPITAL_COMMUNITY)
Admission: EM | Admit: 2012-06-08 | Discharge: 2012-06-09 | Disposition: A | Discharge status: Home / Self Care | Payer: Medicare Other | Attending: Internal Medicine | Admitting: Internal Medicine

## 2012-06-08 ENCOUNTER — Telehealth: Payer: Self-pay | Admitting: Internal Medicine

## 2012-06-08 ENCOUNTER — Observation Stay (HOSPITAL_COMMUNITY): Payer: Medicare Other

## 2012-06-08 ENCOUNTER — Encounter (HOSPITAL_COMMUNITY): Payer: Self-pay | Admitting: Neurology

## 2012-06-08 DIAGNOSIS — R5381 Other malaise: Secondary | ICD-10-CM

## 2012-06-08 DIAGNOSIS — R109 Unspecified abdominal pain: Secondary | ICD-10-CM | POA: Diagnosis not present

## 2012-06-08 DIAGNOSIS — M6281 Muscle weakness (generalized): Secondary | ICD-10-CM | POA: Diagnosis not present

## 2012-06-08 DIAGNOSIS — R1115 Cyclical vomiting syndrome unrelated to migraine: Secondary | ICD-10-CM | POA: Diagnosis not present

## 2012-06-08 DIAGNOSIS — Z79899 Other long term (current) drug therapy: Secondary | ICD-10-CM | POA: Insufficient documentation

## 2012-06-08 DIAGNOSIS — D72829 Elevated white blood cell count, unspecified: Secondary | ICD-10-CM | POA: Diagnosis not present

## 2012-06-08 DIAGNOSIS — R6889 Other general symptoms and signs: Secondary | ICD-10-CM | POA: Diagnosis not present

## 2012-06-08 DIAGNOSIS — I959 Hypotension, unspecified: Secondary | ICD-10-CM | POA: Diagnosis not present

## 2012-06-08 DIAGNOSIS — I951 Orthostatic hypotension: Secondary | ICD-10-CM

## 2012-06-08 DIAGNOSIS — K859 Acute pancreatitis without necrosis or infection, unspecified: Secondary | ICD-10-CM

## 2012-06-08 DIAGNOSIS — I4891 Unspecified atrial fibrillation: Secondary | ICD-10-CM | POA: Insufficient documentation

## 2012-06-08 DIAGNOSIS — R112 Nausea with vomiting, unspecified: Secondary | ICD-10-CM | POA: Diagnosis not present

## 2012-06-08 DIAGNOSIS — Z8673 Personal history of transient ischemic attack (TIA), and cerebral infarction without residual deficits: Secondary | ICD-10-CM | POA: Diagnosis not present

## 2012-06-08 DIAGNOSIS — R197 Diarrhea, unspecified: Secondary | ICD-10-CM | POA: Diagnosis not present

## 2012-06-08 DIAGNOSIS — Z7982 Long term (current) use of aspirin: Secondary | ICD-10-CM | POA: Diagnosis not present

## 2012-06-08 DIAGNOSIS — M545 Low back pain, unspecified: Secondary | ICD-10-CM

## 2012-06-08 DIAGNOSIS — K219 Gastro-esophageal reflux disease without esophagitis: Secondary | ICD-10-CM | POA: Diagnosis not present

## 2012-06-08 DIAGNOSIS — E785 Hyperlipidemia, unspecified: Secondary | ICD-10-CM

## 2012-06-08 DIAGNOSIS — I10 Essential (primary) hypertension: Secondary | ICD-10-CM | POA: Diagnosis not present

## 2012-06-08 DIAGNOSIS — I2510 Atherosclerotic heart disease of native coronary artery without angina pectoris: Secondary | ICD-10-CM | POA: Diagnosis not present

## 2012-06-08 DIAGNOSIS — M1990 Unspecified osteoarthritis, unspecified site: Secondary | ICD-10-CM

## 2012-06-08 DIAGNOSIS — D62 Acute posthemorrhagic anemia: Secondary | ICD-10-CM

## 2012-06-08 DIAGNOSIS — K5730 Diverticulosis of large intestine without perforation or abscess without bleeding: Secondary | ICD-10-CM | POA: Diagnosis not present

## 2012-06-08 DIAGNOSIS — K449 Diaphragmatic hernia without obstruction or gangrene: Secondary | ICD-10-CM | POA: Diagnosis not present

## 2012-06-08 DIAGNOSIS — Z8679 Personal history of other diseases of the circulatory system: Secondary | ICD-10-CM

## 2012-06-08 DIAGNOSIS — G43109 Migraine with aura, not intractable, without status migrainosus: Secondary | ICD-10-CM

## 2012-06-08 DIAGNOSIS — G459 Transient cerebral ischemic attack, unspecified: Secondary | ICD-10-CM

## 2012-06-08 DIAGNOSIS — J111 Influenza due to unidentified influenza virus with other respiratory manifestations: Secondary | ICD-10-CM | POA: Diagnosis not present

## 2012-06-08 DIAGNOSIS — K589 Irritable bowel syndrome without diarrhea: Secondary | ICD-10-CM

## 2012-06-08 DIAGNOSIS — I480 Paroxysmal atrial fibrillation: Secondary | ICD-10-CM

## 2012-06-08 DIAGNOSIS — Z8719 Personal history of other diseases of the digestive system: Secondary | ICD-10-CM

## 2012-06-08 LAB — URINALYSIS, ROUTINE W REFLEX MICROSCOPIC
Glucose, UA: NEGATIVE mg/dL
Ketones, ur: NEGATIVE mg/dL
Leukocytes, UA: NEGATIVE
Specific Gravity, Urine: 1.021 (ref 1.005–1.030)
pH: 5 (ref 5.0–8.0)

## 2012-06-08 LAB — CBC
HCT: 36.3 % (ref 36.0–46.0)
Hemoglobin: 11.9 g/dL — ABNORMAL LOW (ref 12.0–15.0)
Hemoglobin: 12 g/dL (ref 12.0–15.0)
MCH: 28.5 pg (ref 26.0–34.0)
MCH: 29.5 pg (ref 26.0–34.0)
MCV: 89.2 fL (ref 78.0–100.0)
RBC: 4.18 MIL/uL (ref 3.87–5.11)
RBC: 4.07 MIL/uL (ref 3.87–5.11)

## 2012-06-08 LAB — OCCULT BLOOD, POC DEVICE: Fecal Occult Bld: POSITIVE — AB

## 2012-06-08 LAB — COMPREHENSIVE METABOLIC PANEL
AST: 21 U/L (ref 0–37)
Albumin: 3.8 g/dL (ref 3.5–5.2)
CO2: 25 mEq/L (ref 19–32)
Calcium: 9.6 mg/dL (ref 8.4–10.5)
Creatinine, Ser: 0.53 mg/dL (ref 0.50–1.10)
GFR calc non Af Amer: 85 mL/min — ABNORMAL LOW (ref >90)

## 2012-06-08 LAB — TROPONIN I: Troponin I: <0.3 ng/mL (ref <0.30)

## 2012-06-08 LAB — URINE MICROSCOPIC-ADD ON

## 2012-06-08 LAB — LIPASE, BLOOD: Lipase: 409 U/L — ABNORMAL HIGH (ref 11–59)

## 2012-06-08 LAB — CLOSTRIDIUM DIFFICILE BY PCR: Toxigenic C. Difficile by PCR: NEGATIVE

## 2012-06-08 MED ORDER — ACETAMINOPHEN 650 MG RE SUPP: 650.0000 mg | Freq: Four times a day (QID) | RECTAL | Status: DC | PRN

## 2012-06-08 MED ORDER — PROMETHAZINE HCL 25 MG PO TABS: 25.0000 mg | ORAL_TABLET | Freq: Four times a day (QID) | ORAL | Status: DC | PRN

## 2012-06-08 MED ORDER — CALCIUM CARBONATE-VITAMIN D 500-200 MG-UNIT PO TABS
1.0000 | ORAL_TABLET | Freq: Every day | ORAL | Status: DC
  Administered 2012-06-08 – 2012-06-09 (×2): 1 via ORAL
  Filled 2012-06-08 (×2): qty 1

## 2012-06-08 MED ORDER — ONDANSETRON HCL 4 MG/2ML IJ SOLN: 4.0000 mg | Freq: Four times a day (QID) | INTRAMUSCULAR | Status: DC | PRN

## 2012-06-08 MED ORDER — NIACIN ER 500 MG PO CPCR
500.0000 mg | ORAL_CAPSULE | Freq: Every day | ORAL | Status: DC
  Administered 2012-06-08: 500 mg via ORAL
  Filled 2012-06-08 (×2): qty 1

## 2012-06-08 MED ORDER — LOPERAMIDE HCL 2 MG PO CAPS
4.0000 mg | ORAL_CAPSULE | Freq: Once | ORAL | Status: AC
  Administered 2012-06-08: 4 mg via ORAL
  Filled 2012-06-08: qty 2

## 2012-06-08 MED ORDER — SODIUM CHLORIDE 0.9 % IV SOLN
INTRAVENOUS | Status: DC
  Administered 2012-06-08 – 2012-06-09 (×3): via INTRAVENOUS

## 2012-06-08 MED ORDER — VITAMIN C 500 MG PO TABS
500.0000 mg | ORAL_TABLET | Freq: Every day | ORAL | Status: DC
  Administered 2012-06-09: 500 mg via ORAL
  Filled 2012-06-08: qty 1

## 2012-06-08 MED ORDER — APIXABAN 5 MG PO TABS
5.0000 mg | ORAL_TABLET | Freq: Two times a day (BID) | ORAL | Status: DC
  Administered 2012-06-09: 5 mg via ORAL
  Filled 2012-06-08 (×3): qty 1

## 2012-06-08 MED ORDER — ONDANSETRON HCL 4 MG/2ML IJ SOLN
4.0000 mg | Freq: Once | INTRAMUSCULAR | Status: AC
  Administered 2012-06-08: 4 mg via INTRAVENOUS
  Filled 2012-06-08: qty 2

## 2012-06-08 MED ORDER — ASPIRIN 81 MG PO TABS
81.0000 mg | ORAL_TABLET | Freq: Every day | ORAL | Status: DC
  Filled 2012-06-08: qty 1

## 2012-06-08 MED ORDER — HEPARIN SODIUM (PORCINE) 5000 UNIT/ML IJ SOLN: 5000.0000 [IU] | Freq: Three times a day (TID) | INTRAMUSCULAR | Status: DC

## 2012-06-08 MED ORDER — IOHEXOL 300 MG/ML  SOLN
25.0000 mL | INTRAMUSCULAR | Status: AC
  Administered 2012-06-08 (×2): 25 mL via ORAL

## 2012-06-08 MED ORDER — IOHEXOL 300 MG/ML  SOLN
100.0000 mL | Freq: Once | INTRAMUSCULAR | Status: AC | PRN
  Administered 2012-06-08: 100 mL via INTRAVENOUS

## 2012-06-08 MED ORDER — SODIUM CHLORIDE 0.9 % IV BOLUS (SEPSIS)
1000.0000 mL | Freq: Once | INTRAVENOUS | Status: AC
  Administered 2012-06-08: 1000 mL via INTRAVENOUS

## 2012-06-08 MED ORDER — LORAZEPAM 0.5 MG PO TABS
0.5000 mg | ORAL_TABLET | Freq: Three times a day (TID) | ORAL | Status: DC
  Administered 2012-06-08: 0.5 mg via ORAL
  Filled 2012-06-08 (×2): qty 1

## 2012-06-08 MED ORDER — COLESEVELAM HCL 625 MG PO TABS
1875.0000 mg | ORAL_TABLET | Freq: Every day | ORAL | Status: DC
  Filled 2012-06-08: qty 3

## 2012-06-08 MED ORDER — METOPROLOL SUCCINATE ER 50 MG PO TB24
50.0000 mg | ORAL_TABLET | Freq: Every day | ORAL | Status: DC
  Administered 2012-06-08: 50 mg via ORAL
  Filled 2012-06-08 (×2): qty 1

## 2012-06-08 MED ORDER — WHITE PETROLATUM GEL
Status: AC
  Administered 2012-06-08: 18:00:00
  Filled 2012-06-08: qty 5

## 2012-06-08 MED ORDER — OMEGA-3-ACID ETHYL ESTERS 1 G PO CAPS
1.0000 g | ORAL_CAPSULE | Freq: Two times a day (BID) | ORAL | Status: DC
  Administered 2012-06-08 – 2012-06-09 (×2): 1 g via ORAL
  Filled 2012-06-08 (×3): qty 1

## 2012-06-08 MED ORDER — PANTOPRAZOLE SODIUM 40 MG PO TBEC: 40.0000 mg | DELAYED_RELEASE_TABLET | Freq: Every day | ORAL | Status: DC

## 2012-06-08 MED ORDER — ONDANSETRON HCL 4 MG PO TABS: 4.0000 mg | ORAL_TABLET | Freq: Four times a day (QID) | ORAL | Status: DC | PRN

## 2012-06-08 MED ORDER — ONDANSETRON HCL 4 MG/2ML IJ SOLN: 4.0000 mg | Freq: Three times a day (TID) | INTRAMUSCULAR | Status: DC | PRN

## 2012-06-08 MED ORDER — ACETAMINOPHEN 325 MG PO TABS: 650.0000 mg | ORAL_TABLET | Freq: Four times a day (QID) | ORAL | Status: DC | PRN

## 2012-06-08 NOTE — ED Notes (Signed)
Attempt to call report x 1  

## 2012-06-08 NOTE — ED Provider Notes (Signed)
79yo F, c/o sudden onset of N/V/D that began overnight last night.  States she was at a family function on Sat, she and another are ill with the same symptoms that began approx the same time.  Denies CP/SOB, no abd pain, no black or blood in stools or emesis, no back pain.  Has been unable to tol PO due to N/V.  VSS, afebrile, CTA, RRR, abd soft/+BS, NT.  +multiple brown liquid stools while in the ED, heme positive.  WBC and lipase elevated.  Feels "dizzy" when stood.  T/C to [facility] Dr. David Stall, case discussed, including:  HPI, pertinent PM/SHx, VS/PE, dx testing, ED course and treatment:  Agreeable to admit, requests to write temporary orders, obtain medical bed to team 3.     Laray Anger, DO 06/08/12 1247

## 2012-06-08 NOTE — ED Notes (Signed)
Pt states feeling much better, nausea/diarrhea has been relieved.

## 2012-06-08 NOTE — H&P (Signed)
[facility] History and Physical  SHAKESHA SOLTAU ZOX:[id number] DOB: [birth year] DOA: 06/08/2012  Referring physician: Dr. Verne Spurr PCP: Rogelia Boga, MD  Specialists: none  Chief Complaint: nausea vomiting and diarrhea  HPI: Marissa Bowen is a 79 y.o. female  Past medical history of coronary artery disease history and diverticulosis that comes in for sudden onset of nausea vomiting and diarrhea that started in the middle of the night she relates she's had more than 20 bowel movements since 2:30 AM, with intermittent nausea about 6-10 times. Has not been able to keep anything down. She relates no fever, or sick contacts. No abdominal pain. She does relate tenesmus. She denies any recent admissions to the hospital and no antibiotics in the last 3 months. No new medications. In the emergency room she has been complaining of dizziness upon sitting up. She has had about 4 bowel movements in the emergency room all watery. C. difficile was checked which is pending. She was given a liter of normal saline in the emergency room and will start her on normal saline. And refer for admission.  Review of Systems: The patient denies anorexia, fever, weight loss,, vision loss, decreased hearing, hoarseness, chest pain, syncope, dyspnea on exertion, peripheral edema,  hemoptysis, abdominal pain, melena, hematochezia, severe indigestion/heartburn, hematuria, incontinence, genital sores, muscle weakness, suspicious skin lesions, transient blindness, difficulty walking, depression, unusual weight change, abnormal bleeding, enlarged lymph nodes, angioedema, and breast masses.    Past Medical History  Diagnosis Date  . Acute posthemorrhagic anemia 10/23/2009  . BACK PAIN, LUMBAR 09/27/2009  . CORONARY ARTERY DISEASE 11/25/2006  . DIVERTICULITIS, HX OF 11/25/2006  . FATIGUE 07/21/2008  . GERD 11/25/2006  . HYPERLIPIDEMIA 11/25/2006  . HYPERTENSION 11/25/2006  . IBS 12/07/2008  . MIGRAINE WITH AURA 08/13/2007   . Muscle weakness (generalized) 09/27/2009  . OSTEOARTHRITIS 08/13/2007  . PALPITATIONS, HX OF 01/04/2010  . TRANSIENT ISCHEMIC ATTACK 04/13/2007  . Hiatal hernia   . Atrial fibrillation    Past Surgical History  Procedure Laterality Date  . Coronary artery bypass graft    . Abdominal hysterectomy    . Tonsillectomy     Social History:  reports that she has never smoked. She has never used smokeless tobacco. She reports that she does not drink alcohol or use illicit drugs. Is at home with daughter can perform all of her ADLs.  Allergies  Allergen Reactions  . Hydrocodone     REACTION: migraines  . Ibuprofen Other (See Comments)    ulcers  . Oxycodone Hcl     REACTION: migraines  . Prednisone Other (See Comments)    ulcers  . Statins Other (See Comments)    Aches and pains  . Penicillins Swelling and Rash    Family History  Problem Relation Age of Onset  . Heart attack Mother   . Heart failure Father     Prior to Admission medications   Medication Sig Start Date End Date Taking? Authorizing Provider  apixaban (ELIQUIS) 5 MG TABS tablet Take 1 tablet (5 mg total) by mouth 2 (two) times daily. 06/03/12  Yes Herby Abraham, MD  aspirin 81 MG tablet Take 1 tablet (81 mg total) by mouth daily. 06/14/11 06/13/12 Yes Gordy Savers, MD  calcium-vitamin D (OSCAL WITH D) 500-200 MG-UNIT per tablet Take 1 tablet by mouth daily.   Yes Historical Provider, MD  Flaxseed, Linseed, (FLAXSEED OIL) 1000 MG CAPS Take 1,000 mg by mouth at bedtime.    Yes Historical Provider, MD  LORazepam (ATIVAN) 0.5 MG tablet Take 1 tablet (0.5 mg total) by mouth every 8 (eight) hours. For anxiety/sleep 05/15/12  Yes Gordy Savers, MD  metoprolol succinate (TOPROL-XL) 50 MG 24 hr tablet Take 50 mg by mouth daily. Take with or immediately following a meal.   Yes Historical Provider, MD  Multiple Vitamin (MULTIVITAMIN) tablet Take 1 tablet by mouth daily.     Yes Historical Provider, MD  niacin 500 MG  CR capsule Take 500 mg by mouth daily after supper.    Yes Historical Provider, MD  Omega-3 Fatty Acids (FISH OIL) 1000 MG CAPS Take 1,000 mg by mouth 2 (two) times daily.    Yes Historical Provider, MD  pantoprazole (PROTONIX) 40 MG tablet Take 40 mg by mouth every evening.    Yes Historical Provider, MD  promethazine (PHENERGAN) 25 MG tablet Take 1 tablet (25 mg total) by mouth every 6 (six) hours as needed for nausea. 05/14/12  Yes Gordy Savers, MD  vitamin C (ASCORBIC ACID) 500 MG tablet Take 500 mg by mouth daily.     Yes Historical Provider, MD  colesevelam (WELCHOL) 625 MG tablet Take 1,875 mg by mouth daily. 06/14/11   Gordy Savers, MD   Physical Exam: Filed Vitals:   06/08/12 0755 06/08/12 0923  BP: 123/62 120/49  Pulse: 66 69  Temp: 98.3 F (36.8 C)   TempSrc: Oral   Resp:  18  SpO2: 96% 98%     General:  Awake alert and oriented x3 no acute distress tired appearing  Eyes: Anicteric no pallor  ENT: Dry mucous membrane trachea is midline  Neck: No JVD  Cardiovascular: Regular rate and rhythm with positive S1-S2 no murmurs rubs gallops  Respiratory: Good air movement her to auscultation  Abdomen: Positive bowel sounds nontender nondistended and soft  Skin: No rashes or ulcerations  Musculoskeletal: Intact  Psychiatric: Appropriate  Neurologic: Awake alert and oriented x4 coherent for language 3-12 are grossly intact moving all 4 chores without any difficulties.  Labs on Admission:  Basic Metabolic Panel:  Recent Labs Lab 06/08/12 0814  NA 137  K 4.2  CL 101  CO2 25  GLUCOSE 120*  BUN 14  CREATININE 0.53  CALCIUM 9.6   Liver Function Tests:  Recent Labs Lab 06/05/12 1120 06/08/12 0814  AST 20 21  ALT 15 15  ALKPHOS 70 86  BILITOT 0.5 0.4  PROT 7.0 7.4  ALBUMIN 3.8 3.8    Recent Labs Lab 06/08/12 0814 06/08/12 1039  LIPASE 409* 197*   No results found for this basename: AMMONIA,  in the last 168 hours CBC:  Recent  Labs Lab 06/05/12 1120 06/08/12 0814  WBC 6.7 12.4*  NEUTROABS 4.2  --   HGB 11.1* 11.9*  HCT 33.4* 37.0  MCV 87.9 88.5  PLT 218.0 223   Cardiac Enzymes:  Recent Labs Lab 06/08/12 0815  TROPONINI <0.30    BNP (last 3 results) No results found for this basename: PROBNP,  in the last 8760 hours CBG: No results found for this basename: GLUCAP,  in the last 168 hours  Radiological Exams on Admission: Dg Abd Acute W/chest  06/08/2012  *RADIOLOGY REPORT*  Clinical Data: Nausea, vomiting, diarrhea, lower abdominal cramping, past history hypertension, coronary artery disease, diverticulitis  ACUTE ABDOMEN SERIES (ABDOMEN 2 VIEW & CHEST 1 VIEW)  Comparison: Chest radiograph 03/25/2012, abdominal radiographs 10/19/2011  Findings: Upper normal heart size post CABG. Atherosclerotic calcification aorta. Pulmonary vascularity normal. Chronic elevation right diaphragm. No  infiltrate, pleural effusion or pneumothorax. Nonobstructive bowel gas pattern. No bowel dilatation, bowel wall thickening, or free intraperitoneal air. Numerous pelvic phleboliths without definite urinary tract calcification. Bones demineralized.  IMPRESSION: No acute abnormalities.   Original Report Authenticated By: Ulyses Southward, M.D.     EKG: Sinus rhythm nonspecific T wave abnormalities.  Assessment/Plan Intractable nausea and vomiting/ Diarrhea/Leukocytosis: - Admit her under observation.  - Unclear etiology for her diarrhea nausea and vomiting. She is trying to drink her contrast for the CT scan of the abdomen and pelvis the ED has ordered which I agree and seems to be having a hard time. I will have him check her for C. difficile. Agree with getting stool cultures and ova and parasites. She seems to have no risk factors for any parasitic infections. I will then check her for HIV. We'll go ahead and stop her Lomotil. We'll use Zofran for nausea, we'll check her for hepatitis that is unlikely as her LFTs are within normal  limits.  - UA does not show any significant amount of white blood cells, specific gravity is 1021. Chest x-ray does not show any infiltrates. An abdominal x-ray showed nonspecific bowel gas pattern. In the cecal caput test was sent which was positive. The patient is an aspirin and Eloquis, her stools were maroon looking.  - Elevation of her lipase could be secondary to her nausea, vomiting and diarrhea. Will repeated lipase here in the emergency room shows stranding down. She has no history of alcohol use or binge drinking. She has been started no new medications. I doubt this chronic pancreatitis. We'll await CT scan of the abdomen and pelvis.   Orthostatic hypotension - She describes dizziness upon sitting up, which is not surprisingly she's had more than 20 bowel movements and unable to keep anything down. We'll go ahead and hold her blood pressure medications. Start on aggressive IV fluids. In the emergency room she had a liter of normal saline. We'll follow strict I.'s nose. Fortunately she has no electrolyte imbalance and her creatinine seems to be close to baseline. We'll continue normal saline. I reevaluate in the morning.  HYPERTENSION - Continue her metoprolol.  PAF (paroxysmal atrial fibrillation) - Currently rate controlled, containing Eliquis.   Code Status: Full Family Communication: none present Disposition Plan: observation  Time spent: 65 minutes  Marinda Elk [facility] Pager [phone]  If 7PM-7AM, please contact night-coverage [email or web address] Password [facility] 06/08/2012, 12:28 PM

## 2012-06-08 NOTE — Telephone Encounter (Signed)
[facility] Triage Call Report Triage Record Num: [id number] Operator: Edgar Frisk Patient Name: Marissa Bowen Call Date & Time: 06/08/2012 6:51:47AM Patient Phone: [phone] PCP: Gordy Savers Patient Gender: Female PCP Fax : [phone] Patient DOB: 10/10/1926 [facility] Name: [facility] Reason for Call: Caller: Norton Blizzard; PCP: Eleonore Chiquito [facility]); CB#: [phone]; Call regarding Vomiting; Daughter reports pt has vomiting since 1am with diarrhea over the last hour. Pt very weak, having difficult ambulating due to weakness. To ED for evaluation now Protocol(s) Used: Nausea or Vomiting Recommended Outcome per Protocol: See ED Immediately Reason for Outcome: Signs of dehydration Care Advice: ~ Another adult should drive. Write down provider's name. List or place the following in a bag for transport with the patient: current prescription and/or nonprescription medications; alternative treatments, therapies and medications; and street drugs. ~ 02/10/

## 2012-06-08 NOTE — ED Notes (Signed)
Did an in and out cath on patient clear yellow urin in return

## 2012-06-08 NOTE — ED Notes (Signed)
Patient transported to CT 

## 2012-06-08 NOTE — ED Provider Notes (Signed)
History     CSN: [id number]  Arrival date & time 06/08/12  7829   First MD Initiated Contact with Patient 06/08/12 [phone]      Chief Complaint  Patient presents with  . Nausea  . Emesis    (Consider location/radiation/quality/duration/timing/severity/associated sxs/prior treatment) The history is provided by the patient and medical records.   79 year old female presents to emergency department with chief complaint of sudden onset nausea, vomiting, diarrhea this morning at approximately 2:30 AM.  Patient states she has frequent difficulty sleeping.  She was up and feeling slightly agitated.  She took a lorazepam.  Approximately one half hour later she had sudden onset nausea and vomiting.  She states she had innumerable episodes of vomiting and retching approximately every 10 minutes.  She was unable to tolerate any by mouth fluids.  Around 5 AM she began having profuse loose stools along with her vomiting.  She states currently that she feels weak and dehydrated.  She denies any abdominal pain.  She does have associated tenesmus.  She is a history of laparoscopic hysterectomy.  She has had no previous bowel obstructions.  Patient does have a history of arterial disease.  She is followed by Dr. Riley Kill at [facility] cardiology.  She denies any chest pain, shortness of breath, radiation of pain into the left shoulder or jaw.  She is currently on eliquis as part of a clinical trial.  She states that cardiology needs to be notified about her presents to the ED.  She denies any hematemesis, hematochezia or melena.  Past Medical History  Diagnosis Date  . Acute posthemorrhagic anemia 10/23/2009  . BACK PAIN, LUMBAR 09/27/2009  . CORONARY ARTERY DISEASE 11/25/2006  . DIVERTICULITIS, HX OF 11/25/2006  . FATIGUE 07/21/2008  . GERD 11/25/2006  . HYPERLIPIDEMIA 11/25/2006  . HYPERTENSION 11/25/2006  . IBS 12/07/2008  . MIGRAINE WITH AURA 08/13/2007  . Muscle weakness (generalized) 09/27/2009  .  OSTEOARTHRITIS 08/13/2007  . PALPITATIONS, HX OF 01/04/2010  . TRANSIENT ISCHEMIC ATTACK 04/13/2007  . Hiatal hernia   . Atrial fibrillation     Past Surgical History  Procedure Laterality Date  . Coronary artery bypass graft    . Abdominal hysterectomy    . Tonsillectomy      No family history on file.  History  Substance Use Topics  . Smoking status: Never Smoker   . Smokeless tobacco: Never Used  . Alcohol Use: No    OB History   Grav Para Term Preterm Abortions TAB SAB Ect Mult Living                  Review of Systems Ten systems reviewed and are negative for acute change, except as noted in the HPI.   Allergies  Hydrocodone; Ibuprofen; Oxycodone hcl; Prednisone; Statins; and Penicillins  Home Medications   Current Outpatient Rx  Name  Route  Sig  Dispense  Refill  . apixaban (ELIQUIS) 5 MG TABS tablet   Oral   Take 1 tablet (5 mg total) by mouth 2 (two) times daily.   60 tablet   6   . aspirin 81 MG tablet   Oral   Take 1 tablet (81 mg total) by mouth daily.   30 tablet   6   . calcium-vitamin D (OSCAL WITH D) 500-200 MG-UNIT per tablet   Oral   Take 1 tablet by mouth daily.         . colesevelam (WELCHOL) 625 MG tablet   Oral  Take 1,875 mg by mouth daily.         . Flaxseed, Linseed, (FLAXSEED OIL) 1000 MG CAPS   Oral   Take 1,000 mg by mouth at bedtime.          Marland Kitchen LORazepam (ATIVAN) 0.5 MG tablet   Oral   Take 1 tablet (0.5 mg total) by mouth every 8 (eight) hours. For anxiety/sleep   90 tablet   2   . metoprolol succinate (TOPROL-XL) 50 MG 24 hr tablet   Oral   Take 50 mg by mouth daily. Take with or immediately following a meal.         . Multiple Vitamin (MULTIVITAMIN) tablet   Oral   Take 1 tablet by mouth daily.           . niacin 500 MG CR capsule   Oral   Take 500 mg by mouth daily after supper.          . Omega-3 Fatty Acids (FISH OIL) 1000 MG CAPS   Oral   Take 1,000 mg by mouth 2 (two) times daily.           . pantoprazole (PROTONIX) 40 MG tablet   Oral   Take 40 mg by mouth every evening.          . polyethylene glycol (MIRALAX / GLYCOLAX) packet   Oral   Take 17 g by mouth daily as needed.          . promethazine (PHENERGAN) 25 MG tablet   Oral   Take 1 tablet (25 mg total) by mouth every 6 (six) hours as needed for nausea.   30 tablet   0   . vitamin C (ASCORBIC ACID) 500 MG tablet   Oral   Take 500 mg by mouth daily.             BP 123/62  Pulse 66  Temp(Src) 98.3 F (36.8 C) (Oral)  SpO2 96%  Physical Exam  Physical Exam  Nursing note and vitals reviewed. Constitutional: She is oriented to person, place, and time. She appears well-developed and well-nourished. No distress.  HENT:  Head: Normocephalic and atraumatic.  Eyes: Conjunctivae normal and EOM are normal. Pupils are equal, round, and reactive to light. No scleral icterus.  Neck: Normal range of motion.  Cardiovascular: Normal rate, regular rhythm and normal heart sounds.  Exam reveals no gallop and no friction rub.   No murmur heard. Pulmonary/Chest: Effort normal and breath sounds normal. No respiratory distress.  Abdominal: Soft. Bowel sounds are normal. She exhibits no distension and no mass. There is no tenderness. There is no guarding.  Neurological: She is alert and oriented to person, place, and time.  Skin: Skin is warm and dry. She is not diaphoretic.    ED Course  Procedures (including critical care time)  Labs Reviewed  COMPREHENSIVE METABOLIC PANEL - Abnormal; Notable for the following:    Glucose, Bld 120 (*)    GFR calc non Af Amer 85 (*)    All other components within normal limits  CBC - Abnormal; Notable for the following:    WBC 12.4 (*)    Hemoglobin 11.9 (*)    All other components within normal limits  URINALYSIS, ROUTINE W REFLEX MICROSCOPIC - Abnormal; Notable for the following:    Protein, ur 30 (*)    All other components within normal limits  LIPASE, BLOOD -  Abnormal; Notable for the following:    Lipase 409 (*)  All other components within normal limits  LIPASE, BLOOD - Abnormal; Notable for the following:    Lipase 197 (*)    All other components within normal limits  CREATININE, SERUM - Abnormal; Notable for the following:    GFR calc non Af Amer 87 (*)    All other components within normal limits  OCCULT BLOOD, POC DEVICE - Abnormal; Notable for the following:    Fecal Occult Bld POSITIVE (*)    All other components within normal limits  CLOSTRIDIUM DIFFICILE BY PCR  STOOL CULTURE  CLOSTRIDIUM DIFFICILE BY PCR  TROPONIN I  URINE MICROSCOPIC-ADD ON  CBC  HEPATITIS PANEL, ACUTE  HIV ANTIBODY (ROUTINE TESTING)  COMPREHENSIVE METABOLIC PANEL  CBC  0 Ct Abdomen Pelvis W Contrast  06/08/2012  *RADIOLOGY REPORT*  Clinical Data: Sudden onset of nausea, vomiting and diarrhea.  CT ABDOMEN AND PELVIS WITH CONTRAST  Technique:  Multidetector CT imaging of the abdomen and pelvis was performed following the standard protocol during bolus administration of intravenous contrast.  Contrast: OMNIPAQUE IOHEXOL 300 MG/ML  SOLN  Comparison: 06/08/2012 plain film examination.  No comparison CT  Findings: Stomach is under distended evaluation limited.  Small duodenal diverticula incidentally noted.  No extraluminal bowel inflammatory process, free fluid or free air.  Specifically, no inflammation surrounds the appendix or sigmoid diverticula.  Small hiatal hernia.  Gallbladder is full without calcified gallstone.  If primary gallbladder abnormality were of concern ultrasound may be considered for further delineation.  No focal worrisome hepatic, splenic, pancreatic, adrenal or renal lesion.  3.5 cm left renal cyst.  Slight fullness of the right renal collecting system, no obstructing calculus is noted.  Pessary is in place.  Decompressed non contrast filled imaging of the urinary bladder unremarkable.  Atherosclerotic type changes of the aorta and branch  vessels.  No abdominal aortic aneurysm.  Coronary artery calcifications.  Degenerative changes lumbar spine and hip joints without bony destructive lesion.  IMPRESSION: Sigmoid diverticulosis without extraluminal bowel inflammation.  Under distended stomach limits evaluation.  Small hiatal hernia is noted.  Mild fullness of the right renal collecting system without obstructing stone detected.  Gallbladder is full without gallstones.  If primary gallbladder abnormality is of concern ultrasound may be considered.  Atherosclerotic type changes as noted above.   Original Report Authenticated By: Lacy Duverney, M.D.    Dg Abd Acute W/chest  06/08/2012  *RADIOLOGY REPORT*  Clinical Data: Nausea, vomiting, diarrhea, lower abdominal cramping, past history hypertension, coronary artery disease, diverticulitis  ACUTE ABDOMEN SERIES (ABDOMEN 2 VIEW & CHEST 1 VIEW)  Comparison: Chest radiograph 03/25/2012, abdominal radiographs 10/19/2011  Findings: Upper normal heart size post CABG. Atherosclerotic calcification aorta. Pulmonary vascularity normal. Chronic elevation right diaphragm. No infiltrate, pleural effusion or pneumothorax. Nonobstructive bowel gas pattern. No bowel dilatation, bowel wall thickening, or free intraperitoneal air. Numerous pelvic phleboliths without definite urinary tract calcification. Bones demineralized.  IMPRESSION: No acute abnormalities.   Original Report Authenticated By: Ulyses Southward, M.D.      1. Acute pancreatitis   2. Acute diarrhea   3. Nausea and vomiting       MDM  8:11 AM BP 123/62  Pulse 66  Temp(Src) 98.3 F (36.8 C) (Oral)  SpO2 96% Patient appears well.  No episodes of vomiting currently.  Obtaining labs and cardiac workup.  Will contact Dr. Riley Kill. Patient states that she feels she has a viral infection and she's had multiple family members with the same.  8:45 Patient c/o continued tenesmus and  feels as though she will have diarrhea. Loperamide ordered.  8:53  AM  Date: 06/08/2012  Rate: 66  Rhythm: normal sinus rhythm  QRS Axis: normal  Intervals: normal  ST/T Wave abnormalities: nonspecific ST changes  Conduction Disutrbances:none  Narrative Interpretation: abnormal ECG. Scooped ST segments leads II,AvF, V5 , V6  Old EKG Reviewed: changes noted   9:15 AM Patient with what appears to be a pancreatitis although no signs of abdominal pain. I spoke with Hospitalist Dr. Butler Denmark. Who requests CT abdomen with contrast and repeat Lipase for verification.   11:52 AM Repeat lipase is still elevated.  The patient is on Eliquis  In a study.  She states that she was told she has to take the medicine every day at the same time or she can have a bleeding event.  I tried to verify this on Up to date and was unable to do so.  Stool is being sent for culture.  Will obtain occult stool. Patient will be admited by Dr. David Stall.  Cardiology will consult.   Arthor Captain, PA-C 06/08/12 2204

## 2012-06-08 NOTE — ED Notes (Signed)
Admitting MD at bedside.

## 2012-06-08 NOTE — ED Notes (Signed)
Patient transported to X-ray 

## 2012-06-08 NOTE — ED Notes (Signed)
Ordered patient clear liquid lunch tray, CT here to get patient for transport.

## 2012-06-08 NOTE — Progress Notes (Signed)
Marissa Bowen [id number] Admitted to [id number]: 06/08/2012 4:53 PM Attending Provider: Marinda Elk, MD    Marissa Bowen is a 79 y.o. female patient admitted from ED awake, alert  & orientated  X 3,  Full Code, VSS - Blood pressure 138/42, pulse 70, temperature 99.5 F (37.5 C), temperature source Oral, resp. rate 18, height 5\' 8"  (1.727 m), weight 75.161 kg (165 lb 11.2 oz), SpO2 95.00%., R/A, no c/o shortness of breath, no c/o chest pain, no distress noted, non-telemetry.   IV site WDL:  antecubital left, condition patent and no redness with a transparent dsg that's clean dry and intact.  Allergies:   Allergies  Allergen Reactions  . Hydrocodone     REACTION: migraines  . Ibuprofen Other (See Comments)    ulcers  . Oxycodone Hcl     REACTION: migraines  . Prednisone Other (See Comments)    ulcers  . Statins Other (See Comments)    Aches and pains  . Penicillins Swelling and Rash     Past Medical History  Diagnosis Date  . Acute posthemorrhagic anemia 10/23/2009  . BACK PAIN, LUMBAR 09/27/2009  . CORONARY ARTERY DISEASE 11/25/2006  . DIVERTICULITIS, HX OF 11/25/2006  . FATIGUE 07/21/2008  . GERD 11/25/2006  . HYPERLIPIDEMIA 11/25/2006  . HYPERTENSION 11/25/2006  . IBS 12/07/2008  . MIGRAINE WITH AURA 08/13/2007  . Muscle weakness (generalized) 09/27/2009  . OSTEOARTHRITIS 08/13/2007  . PALPITATIONS, HX OF 01/04/2010  . TRANSIENT ISCHEMIC ATTACK 04/13/2007  . Hiatal hernia   . Atrial fibrillation     History:  obtained from patient.  Pt orientation to unit, room and routine. Information packet given to patient/family and safety video watched.  Admission INP armband ID verified with patient/family, and in place. SR up x 2, fall risk assessment complete with Patient and family verbalizing understanding of risks associated with falls. Pt verbalizes an understanding of how to use the call bell and to call for help before getting out of bed.  Skin, clean-dry- intact without evidence of  bruising, or skin tears.   No evidence of skin break down noted on exam.    Will cont to monitor and assist as needed.  Joana Reamer, RN 06/08/2012 4:53 PM

## 2012-06-08 NOTE — ED Notes (Signed)
Called CT about patient coming over for CT, states has sent for patient

## 2012-06-08 NOTE — Plan of Care (Signed)
Problem: Phase I Progression Outcomes Goal: Initial discharge plan identified Outcome: Completed/Met Date Met:  06/08/12 To return home with husband

## 2012-06-08 NOTE — ED Notes (Signed)
Per EMS- Pt comes from home, states n/v/d since 0230 this morning. States taking eliquis, part of clinical trial, need to page Dr. Riley Kill and notify patient in ER. Pt denies any CP. CBG 123, BP 128/71, HR 60, 97% 2 L. Pt alert and oriented.

## 2012-06-09 ENCOUNTER — Encounter (HOSPITAL_COMMUNITY): Payer: Self-pay | Admitting: General Practice

## 2012-06-09 DIAGNOSIS — R112 Nausea with vomiting, unspecified: Secondary | ICD-10-CM

## 2012-06-09 DIAGNOSIS — R197 Diarrhea, unspecified: Secondary | ICD-10-CM | POA: Diagnosis not present

## 2012-06-09 DIAGNOSIS — I10 Essential (primary) hypertension: Secondary | ICD-10-CM | POA: Diagnosis not present

## 2012-06-09 DIAGNOSIS — I951 Orthostatic hypotension: Secondary | ICD-10-CM | POA: Diagnosis not present

## 2012-06-09 LAB — COMPREHENSIVE METABOLIC PANEL
AST: 18 U/L (ref 0–37)
CO2: 25 mEq/L (ref 19–32)
Calcium: 8.5 mg/dL (ref 8.4–10.5)
Creatinine, Ser: 0.58 mg/dL (ref 0.50–1.10)
GFR calc non Af Amer: 83 mL/min — ABNORMAL LOW (ref >90)

## 2012-06-09 LAB — HEPATITIS PANEL, ACUTE
HCV Ab: NEGATIVE
Hep A IgM: NEGATIVE
Hep B C IgM: NEGATIVE
Hepatitis B Surface Ag: NEGATIVE

## 2012-06-09 LAB — CBC
Hemoglobin: 10 g/dL — ABNORMAL LOW (ref 12.0–15.0)
MCH: 29.6 pg (ref 26.0–34.0)
RBC: 3.38 MIL/uL — ABNORMAL LOW (ref 3.87–5.11)

## 2012-06-09 LAB — HIV ANTIBODY (ROUTINE TESTING W REFLEX): HIV: NONREACTIVE

## 2012-06-09 MED ORDER — ONDANSETRON 4 MG PO TBDP: 4.0000 mg | ORAL_TABLET | Freq: Three times a day (TID) | ORAL | Status: DC | PRN

## 2012-06-09 NOTE — Progress Notes (Signed)
NURSING PROGRESS NOTE  Marissa Bowen [id number] Discharge Data: 06/09/2012 12:19 PM Attending Provider: No att. providers found WUJ:[id number],AOZHY Homero Fellers, MD     Ailene Rud to be D/C'd Home per MD order.  Discussed with the patient the After Visit Summary and all questions fully answered. All IV's discontinued with no bleeding noted. All belongings returned to patient for patient to take home.   Last Vital Signs:  Blood pressure 128/67, pulse 62, temperature 98.5 F (36.9 C), temperature source Oral, resp. rate 20, height 5\' 8"  (1.727 m), weight 75.161 kg (165 lb 11.2 oz), SpO2 92.00%.  Discharge Medication List   Medication List    TAKE these medications       apixaban 5 MG Tabs tablet  Commonly known as:  ELIQUIS  Take 1 tablet (5 mg total) by mouth 2 (two) times daily.     aspirin 81 MG tablet  Take 1 tablet (81 mg total) by mouth daily.     calcium-vitamin D 500-200 MG-UNIT per tablet  Commonly known as:  OSCAL WITH D  Take 1 tablet by mouth daily.     colesevelam 625 MG tablet  Commonly known as:  WELCHOL  Take 1,875 mg by mouth daily.     Fish Oil 1000 MG Caps  Take 1,000 mg by mouth 2 (two) times daily.     Flaxseed Oil 1000 MG Caps  Take 1,000 mg by mouth at bedtime.     LORazepam 0.5 MG tablet  Commonly known as:  ATIVAN  Take 1 tablet (0.5 mg total) by mouth every 8 (eight) hours. For anxiety/sleep     metoprolol succinate 50 MG 24 hr tablet  Commonly known as:  TOPROL-XL  Take 50 mg by mouth daily. Take with or immediately following a meal.     multivitamin tablet  Take 1 tablet by mouth daily.     niacin 500 MG CR capsule  Take 500 mg by mouth daily after supper.     ondansetron 4 MG disintegrating tablet  Commonly known as:  ZOFRAN ODT  Take 1 tablet (4 mg total) by mouth every 8 (eight) hours as needed for nausea.     pantoprazole 40 MG tablet  Commonly known as:  PROTONIX  Take 40 mg by mouth every evening.     promethazine 25 MG  tablet  Commonly known as:  PHENERGAN  Take 1 tablet (25 mg total) by mouth every 6 (six) hours as needed for nausea.     vitamin C 500 MG tablet  Commonly known as:  ASCORBIC ACID  Take 500 mg by mouth daily.        Rosalie Doctor, RN

## 2012-06-09 NOTE — Discharge Summary (Signed)
-   Agree with above. - Nausea and vomiting resolved. - home today.

## 2012-06-09 NOTE — Evaluation (Signed)
Physical Therapy Evaluation Patient Details Name: Marissa Bowen MRN: [id number] DOB: 07/30/1926 Today's Date: 06/09/2012 Time: 4098-1191 PT Time Calculation (min): 15 min  PT Assessment / Plan / Recommendation Clinical Impression  Pt adm with n/v/d.  Pt doing fairly well with mobility and should be able to return home with family.    PT Assessment  Patent does not need any further PT services    Follow Up Recommendations  No PT follow up    Does the patient have the potential to tolerate intense rehabilitation      Barriers to Discharge        Equipment Recommendations  None recommended by PT    Recommendations for Other Services     Frequency      Precautions / Restrictions Restrictions Weight Bearing Restrictions: No   Pertinent Vitals/Pain See vitals      Mobility  Transfers Transfers: Sit to Stand;Stand to Sit Sit to Stand: 6: Modified independent (Device/Increase time);With upper extremity assist;From bed Stand to Sit: 6: Modified independent (Device/Increase time);With upper extremity assist;To bed Ambulation/Gait Ambulation/Gait Assistance: 5: Supervision;6: Modified independent (Device/Increase time) Ambulation Distance (Feet): 250 Feet Assistive device: None Ambulation/Gait Assistance Details: Initially supervision and then improved to Mod I with incr distance Gait Pattern: Step-through pattern;Decreased stride length Gait velocity: decr    Exercises     PT Diagnosis:    PT Problem List:   PT Treatment Interventions:     PT Goals    Visit Information  Last PT Received On: 06/09/12 Assistance Needed: +1    Subjective Data  Subjective: Pt states she wants to go home today so she can go to the store before the snow. Patient Stated Goal: Go home   Prior Functioning  Home Living Lives With: Spouse Type of Home: House Home Access: Stairs to enter Secretary/administrator of Steps: 1 Home Layout: One level Home Adaptive Equipment: Walker -  rolling Prior Function Level of Independence: Independent Able to Take Stairs?: Yes Driving: Yes Vocation: Retired Musician: No difficulties    Copywriter, advertising Overall Cognitive Status: Appears within functional limits for tasks assessed/performed Arousal/Alertness: Awake/alert Orientation Level: Appears intact for tasks assessed Behavior During Session: [facility] for tasks performed    Extremity/Trunk Assessment Right Lower Extremity Assessment RLE ROM/Strength/Tone: [facility] for tasks assessed Left Lower Extremity Assessment LLE ROM/Strength/Tone: [facility] for tasks assessed   Balance Balance Balance Assessed: Yes Static Standing Balance Static Standing - Balance Support: No upper extremity supported;During functional activity Static Standing - Level of Assistance: 6: Modified independent (Device/Increase time)  End of Session PT - End of Session Activity Tolerance: Patient tolerated treatment well Patient left: Other (comment) (standing  at sink brushing teeth)  GP Functional Assessment Tool Used: clinical judgement Functional Limitation: Mobility: Walking and moving around Mobility: Walking and Moving Around Current Status [phone]): 0 percent impaired, limited or restricted Mobility: Walking and Moving Around Goal Status [phone]): 0 percent impaired, limited or restricted Mobility: Walking and Moving Around Discharge Status [phone]): 0 percent impaired, limited or restricted   [facility] 06/09/2012, 9:41 AM  [facility] PT [phone]

## 2012-06-09 NOTE — Care Management Note (Signed)
    Page 1 of 1   06/09/2012     11:56:32 AM   CARE MANAGEMENT NOTE 06/09/2012  Patient:  Marissa Bowen, Marissa Bowen   Account Number:  [id number]  Date Initiated:  06/09/2012  Documentation initiated by:  Letha Cape  Subjective/Objective Assessment:   dx n/v/d  admit- lives with spouse. pta indep.     Action/Plan:   Anticipated DC Date:  06/09/2012   Anticipated DC Plan:  HOME/SELF CARE      DC Planning Services  CM consult      Choice offered to / List presented to:             Status of service:  Completed, signed off Medicare Important Message given?   (If response is "NO", the following Medicare IM given date fields will be blank) Date Medicare IM given:   Date Additional Medicare IM given:    Discharge Disposition:  HOME/SELF CARE  Per UR Regulation:  Reviewed for med. necessity/level of care/duration of stay  If discussed at Long Length of Stay Meetings, dates discussed:    Comments:  06/09/12 11:55 Letha Cape RN, BSN [phone] patient lives with spouse, pta indep.  Patient for dc today, no needs anticipated.

## 2012-06-09 NOTE — Discharge Summary (Signed)
Physician Discharge Summary  Marissa Bowen ZOX:[id number] DOB: 1926-12-09 DOA: 06/08/2012  PCP: Rogelia Boga, MD  Admit date: 06/08/2012 Discharge date: 06/09/2012  Time spent: 35 minutes  Recommendations for Outpatient Follow-up:  Check CBC and bmet.  Patient hospitalized with gastroenteritis.  Found to be anemic on cbc.    Discharge Diagnoses:  Principal Problem:   Diarrhea Active Problems:   HYPERTENSION   PAF (paroxysmal atrial fibrillation)   Intractable nausea and vomiting   Leukocytosis   Orthostatic hypotension   Discharge Condition: stable, eating and ambulating  Diet recommendation: heart healthy  Filed Weights   06/08/12 1609  Weight: 75.161 kg (165 lb 11.2 oz)    History of present illness:  Marissa Bowen is a 79 y.o. female  Past medical history of coronary artery disease history and diverticulosis that comes in for sudden onset of nausea vomiting and diarrhea that started in the middle of the night she relates she's had more than 20 bowel movements since 2:30 AM, with intermittent nausea about 6-10 times. Has not been able to keep anything down. She relates no fever, but + sick contacts. No abdominal pain. She does relate tenesmus. She denies any recent admissions to the hospital and no antibiotics in the last 3 months. No new medications. In the emergency room she has been complaining of dizziness upon sitting up. She has had about 4 bowel movements in the emergency room all watery. C. difficile was checked which is pending. She was given a liter of normal saline in the emergency room and will start her on normal saline. And refer for admission.   Hospital Course:  After further history gathering, the patient mentioned that her family had been out to eat for a family reunion, and multiple family members had developed vomiting and diarrhea. Consequently this was felt to be viral gastroenteritis versus food poisoning. C. difficile PCR was negative. HIV was  nonreactive. She was treated supportively with Zofran and IV fluids. She improved immensely overnight having only one episode of diarrhea at approximately 4 AM. This morning she is determined to go home to care for her sick husband. She was able to tolerate a solid breakfast with no nausea vomiting or diarrhea. She is walking in the halls without any evidence of dizziness or orthostasis. She will be discharged to home with instructions to drink fluids rest and follow up with her primary care physician in one week.   Discharge Exam: Filed Vitals:   06/08/12 2150 06/09/12 0506 06/09/12 0911 06/09/12 0913  BP: 146/43 104/62 129/71 128/67  Pulse: 78 64 62   Temp: 99.3 F (37.4 C) 98.5 F (36.9 C)    TempSrc: Oral Oral    Resp: 20 20    Height:      Weight:      SpO2: 96% 92%      General: A&O, appears well, NAD Cardiovascular: rrr no m/r/g Respiratory: cta no w/c/r Abdomen:  Soft, nt, nd, +BS, no masses Skin:  No rash, bruise, lesions, good color.  Discharge Instructions      Discharge Orders   Future Appointments Provider Department Dept Phone   07/30/2012 10:00 AM Herby Abraham, MD [facility] Main Office [place]) [phone]   09/09/2012 10:30 AM Gordy Savers, MD [facility] [phone]   Future Orders Complete By Expires     Diet - low sodium heart healthy  As directed     Increase activity slowly  As directed  Medication List    TAKE these medications       apixaban 5 MG Tabs tablet  Commonly known as:  ELIQUIS  Take 1 tablet (5 mg total) by mouth 2 (two) times daily.     aspirin 81 MG tablet  Take 1 tablet (81 mg total) by mouth daily.     calcium-vitamin D 500-200 MG-UNIT per tablet  Commonly known as:  OSCAL WITH D  Take 1 tablet by mouth daily.     colesevelam 625 MG tablet  Commonly known as:  WELCHOL  Take 1,875 mg by mouth daily.     Fish Oil 1000 MG Caps  Take 1,000 mg by mouth 2 (two) times daily.      Flaxseed Oil 1000 MG Caps  Take 1,000 mg by mouth at bedtime.     LORazepam 0.5 MG tablet  Commonly known as:  ATIVAN  Take 1 tablet (0.5 mg total) by mouth every 8 (eight) hours. For anxiety/sleep     metoprolol succinate 50 MG 24 hr tablet  Commonly known as:  TOPROL-XL  Take 50 mg by mouth daily. Take with or immediately following a meal.     multivitamin tablet  Take 1 tablet by mouth daily.     niacin 500 MG CR capsule  Take 500 mg by mouth daily after supper.     ondansetron 4 MG disintegrating tablet  Commonly known as:  ZOFRAN ODT  Take 1 tablet (4 mg total) by mouth every 8 (eight) hours as needed for nausea.     pantoprazole 40 MG tablet  Commonly known as:  PROTONIX  Take 40 mg by mouth every evening.     promethazine 25 MG tablet  Commonly known as:  PHENERGAN  Take 1 tablet (25 mg total) by mouth every 6 (six) hours as needed for nausea.     vitamin C 500 MG tablet  Commonly known as:  ASCORBIC ACID  Take 500 mg by mouth daily.       Follow-up Information   Follow up with Rogelia Boga, MD In 1 week. [facility] follow up. (check Hgb and electrolytes))    Contact information:   [address] Christena Flake [facility] [place] [address] [phone]        The results of significant diagnostics from this hospitalization (including imaging, microbiology, ancillary and laboratory) are listed below for reference.    Significant Diagnostic Studies: Ct Abdomen Pelvis W Contrast  06/08/2012  *RADIOLOGY REPORT*  Clinical Data: Sudden onset of nausea, vomiting and diarrhea.  CT ABDOMEN AND PELVIS WITH CONTRAST  Technique:  Multidetector CT imaging of the abdomen and pelvis was performed following the standard protocol during bolus administration of intravenous contrast.  Contrast: OMNIPAQUE IOHEXOL 300 MG/ML  SOLN  Comparison: 06/08/2012 plain film examination.  No comparison CT  Findings: Stomach is under distended evaluation limited.  Small duodenal diverticula  incidentally noted.  No extraluminal bowel inflammatory process, free fluid or free air.  Specifically, no inflammation surrounds the appendix or sigmoid diverticula.  Small hiatal hernia.  Gallbladder is full without calcified gallstone.  If primary gallbladder abnormality were of concern ultrasound may be considered for further delineation.  No focal worrisome hepatic, splenic, pancreatic, adrenal or renal lesion.  3.5 cm left renal cyst.  Slight fullness of the right renal collecting system, no obstructing calculus is noted.  Pessary is in place.  Decompressed non contrast filled imaging of the urinary bladder unremarkable.  Atherosclerotic type changes of the aorta and branch vessels.  No abdominal aortic aneurysm.  Coronary artery calcifications.  Degenerative changes lumbar spine and hip joints without bony destructive lesion.  IMPRESSION: Sigmoid diverticulosis without extraluminal bowel inflammation.  Under distended stomach limits evaluation.  Small hiatal hernia is noted.  Mild fullness of the right renal collecting system without obstructing stone detected.  Gallbladder is full without gallstones.  If primary gallbladder abnormality is of concern ultrasound may be considered.  Atherosclerotic type changes as noted above.   Original Report Authenticated By: Lacy Duverney, M.D.    Dg Abd Acute W/chest  06/08/2012  *RADIOLOGY REPORT*  Clinical Data: Nausea, vomiting, diarrhea, lower abdominal cramping, past history hypertension, coronary artery disease, diverticulitis  ACUTE ABDOMEN SERIES (ABDOMEN 2 VIEW & CHEST 1 VIEW)  Comparison: Chest radiograph 03/25/2012, abdominal radiographs 10/19/2011  Findings: Upper normal heart size post CABG. Atherosclerotic calcification aorta. Pulmonary vascularity normal. Chronic elevation right diaphragm. No infiltrate, pleural effusion or pneumothorax. Nonobstructive bowel gas pattern. No bowel dilatation, bowel wall thickening, or free intraperitoneal air. Numerous  pelvic phleboliths without definite urinary tract calcification. Bones demineralized.  IMPRESSION: No acute abnormalities.   Original Report Authenticated By: Ulyses Southward, M.D.     Microbiology: Recent Results (from the past 240 hour(s))  CLOSTRIDIUM DIFFICILE BY PCR     Status: None   Collection Time    06/08/12 12:02 PM      Result Value Range Status   C difficile by pcr NEGATIVE  NEGATIVE Final     Labs: Basic Metabolic Panel:  Recent Labs Lab 06/08/12 0814 06/08/12 1619 06/09/12 0457  NA 137  --  138  K 4.2  --  3.6  CL 101  --  103  CO2 25  --  25  GLUCOSE 120*  --  89  BUN 14  --  10  CREATININE 0.53 0.51 0.58  CALCIUM 9.6  --  8.5   Liver Function Tests:  Recent Labs Lab 06/05/12 1120 06/08/12 0814 06/09/12 0457  AST 20 21 18   ALT 15 15 12   ALKPHOS 70 86 61  BILITOT 0.5 0.4 0.3  PROT 7.0 7.4 5.7*  ALBUMIN 3.8 3.8 2.9*    Recent Labs Lab 06/08/12 0814 06/08/12 1039  LIPASE 409* 197*   No results found for this basename: AMMONIA,  in the last 168 hours CBC:  Recent Labs Lab 06/05/12 1120 06/08/12 0814 06/08/12 1619 06/09/12 0457  WBC 6.7 12.4* 9.2 4.4  NEUTROABS 4.2  --   --   --   HGB 11.1* 11.9* 12.0 10.0*  HCT 33.4* 37.0 36.3 30.3*  MCV 87.9 88.5 89.2 89.6  PLT 218.0 223 177 196   Cardiac Enzymes:  Recent Labs Lab 06/08/12 0815  TROPONINI <0.30      Signed:  Conley Canal [phone]  [facility] 06/09/2012, 10:48 AM

## 2012-06-10 ENCOUNTER — Telehealth: Payer: Self-pay | Admitting: Cardiology

## 2012-06-10 ENCOUNTER — Telehealth: Payer: Self-pay | Admitting: *Deleted

## 2012-06-10 NOTE — Telephone Encounter (Signed)
Lab results were given to Marissa Bowen.

## 2012-06-10 NOTE — Telephone Encounter (Signed)
transitional care management  Admit date: 06-08-2012 Discharge date: 06-09-2012  Discharge diagnosis: Principal problem:   Diarrhea Active Problems:  htn  paf  intractable nausea and vomiting  leukocytosis  orthostatic hypotension   Talked with pt and she states she is very weak and fatigued.  Encouraged increased liquids and she states she is drinking gatorade, water. Ginger ale.  She states that diarrhea has finally subsided but still has nausea at time,but no vomiting.  Pt is knowledgeable of her condition anad the treatment..  She is compliant with her medication regimen.  Post hospital follow up with dr Amador Cunas on 06/16/2012 at 10:30.'

## 2012-06-10 NOTE — ED Provider Notes (Signed)
Medical screening examination/treatment/procedure(s) were conducted as a shared visit with non-physician practitioner(s) and myself.  I personally evaluated the patient during the encounter Please see my previous note.  Laray Anger, DO 06/10/12 1550

## 2012-06-10 NOTE — Telephone Encounter (Signed)
New Problem     Pt requesting results from testing last week.

## 2012-06-11 ENCOUNTER — Telehealth: Payer: Self-pay | Admitting: Cardiology

## 2012-06-11 DIAGNOSIS — D649 Anemia, unspecified: Secondary | ICD-10-CM

## 2012-06-11 NOTE — Assessment & Plan Note (Addendum)
Currently in NSR.  Will continue to follow at present.   Continue to monitor CBC with prior history.  However, as I have stressed to her non treatment has significant risk of stroke.

## 2012-06-11 NOTE — Assessment & Plan Note (Signed)
No recurrent symptoms or clinically important ischemia noted.

## 2012-06-11 NOTE — Assessment & Plan Note (Signed)
Has had prior event and documented TIA.  Therefore, options of antithrombin discussed.

## 2012-06-11 NOTE — Telephone Encounter (Signed)
I called patient.  She is slowly getting better.  Hgb was 12, then 10  (but likely after hydration therapy).  She will need follow up hemoglobin next week.  She is stable at present.  She will call Monday to get in for CBC  )(office closed today and tomoorw for snow)  TS

## 2012-06-15 NOTE — Telephone Encounter (Signed)
I spoke with Dr Riley Kill to clarify his instructions for the pt.  The pt does not need an office visit at this time.  Per Dr Riley Kill the pt needs to have a CBC rechecked. The pt is scheduled to see her PCP tomorrow and she can have CBC at that time.  Order placed for lab work.

## 2012-06-15 NOTE — Telephone Encounter (Signed)
New Problem   Pt called in today wanting to make an appt for today. Stated Dr. Riley Kill said he wanted to see her today. No appts available. Scheduled appt for tomorrow 2/18 at 2:15.

## 2012-06-16 ENCOUNTER — Encounter: Payer: Self-pay | Admitting: Internal Medicine

## 2012-06-16 ENCOUNTER — Ambulatory Visit: Payer: Medicare Other | Admitting: Cardiology

## 2012-06-16 ENCOUNTER — Ambulatory Visit (INDEPENDENT_AMBULATORY_CARE_PROVIDER_SITE_OTHER): Payer: Medicare Other | Admitting: Internal Medicine

## 2012-06-16 VITALS — BP 162/70 | HR 65 | Temp 98.4°F | Resp 18 | Wt 163.0 lb

## 2012-06-16 DIAGNOSIS — R5383 Other fatigue: Secondary | ICD-10-CM

## 2012-06-16 DIAGNOSIS — D649 Anemia, unspecified: Secondary | ICD-10-CM | POA: Diagnosis not present

## 2012-06-16 DIAGNOSIS — K5289 Other specified noninfective gastroenteritis and colitis: Secondary | ICD-10-CM

## 2012-06-16 DIAGNOSIS — R112 Nausea with vomiting, unspecified: Secondary | ICD-10-CM

## 2012-06-16 DIAGNOSIS — R197 Diarrhea, unspecified: Secondary | ICD-10-CM

## 2012-06-16 DIAGNOSIS — I2510 Atherosclerotic heart disease of native coronary artery without angina pectoris: Secondary | ICD-10-CM

## 2012-06-16 LAB — CBC WITH DIFFERENTIAL/PLATELET
Basophils Absolute: 0 K/uL (ref 0.0–0.1)
Basophils Relative: 0.4 % (ref 0.0–3.0)
Eosinophils Absolute: 0.1 K/uL (ref 0.0–0.7)
Eosinophils Relative: 1.9 % (ref 0.0–5.0)
HCT: 31.5 % — ABNORMAL LOW (ref 36.0–46.0)
Hemoglobin: 10.6 g/dL — ABNORMAL LOW (ref 12.0–15.0)
Lymphocytes Relative: 21.6 % (ref 12.0–46.0)
Lymphs Abs: 1.4 K/uL (ref 0.7–4.0)
MCHC: 33.5 g/dL (ref 30.0–36.0)
MCV: 86.8 fl (ref 78.0–100.0)
Monocytes Absolute: 0.7 K/uL (ref 0.1–1.0)
Monocytes Relative: 11.1 % (ref 3.0–12.0)
Neutro Abs: 4.3 K/uL (ref 1.4–7.7)
Neutrophils Relative %: 65 % (ref 43.0–77.0)
Platelets: 234 K/uL (ref 150.0–400.0)
RBC: 3.64 Mil/uL — ABNORMAL LOW (ref 3.87–5.11)
RDW: 13.6 % (ref 11.5–14.6)
WBC: 6.6 K/uL (ref 4.5–10.5)

## 2012-06-16 LAB — BASIC METABOLIC PANEL
CO2: 30 mEq/L (ref 19–32)
Calcium: 8.9 mg/dL (ref 8.4–10.5)
Creatinine, Ser: 0.6 mg/dL (ref 0.4–1.2)
Glucose, Bld: 87 mg/dL (ref 70–99)

## 2012-06-16 NOTE — Patient Instructions (Addendum)
Advance diet as tolerated  She'll resume usual daily activities  Return in 6 months for follow-up

## 2012-06-16 NOTE — Progress Notes (Signed)
Subjective:    Patient ID: Marissa Bowen, female    DOB: 1927/04/23, 79 y.o.   MRN: [id number]  HPI  79 year old patient who is seen following hospital discharge. She has met with intractable nausea vomiting and profuse diarrhea. Complications included the orthostasis. Since her discharge she has done well and diarrhea has resolved he complains of some persistent fatigue but is tolerating oral intake well without difficulty. Hospital course was prompted by anemia. Stable medical problems include dyslipidemia hypertension and CAD. She has a history of paroxysmal atrial fibrillation. She remains on anticoagulation  Hospital records reviewed  Past Medical History  Diagnosis Date  . Acute posthemorrhagic anemia 10/23/2009  . BACK PAIN, LUMBAR 09/27/2009  . CORONARY ARTERY DISEASE 11/25/2006  . DIVERTICULITIS, HX OF 11/25/2006  . FATIGUE 07/21/2008  . GERD 11/25/2006  . HYPERLIPIDEMIA 11/25/2006  . HYPERTENSION 11/25/2006  . IBS 12/07/2008  . MIGRAINE WITH AURA 08/13/2007  . Muscle weakness (generalized) 09/27/2009  . OSTEOARTHRITIS 08/13/2007  . PALPITATIONS, HX OF 01/04/2010  . TRANSIENT ISCHEMIC ATTACK 04/13/2007  . Hiatal hernia   . Atrial fibrillation   . Family history of anesthesia complication     DAUGHTER HAS  NAUSEA    History   Social History  . Marital Status: Married    Spouse Name: N/A    Number of Children: N/A  . Years of Education: N/A   Occupational History  . Not on file.   Social History Main Topics  . Smoking status: Never Smoker   . Smokeless tobacco: Never Used  . Alcohol Use: No  . Drug Use: No  . Sexually Active: No   Other Topics Concern  . Not on file   Social History Narrative  . No narrative on file    Past Surgical History  Procedure Laterality Date  . Coronary artery bypass graft    . Abdominal hysterectomy    . Tonsillectomy      Family History  Problem Relation Age of Onset  . Heart attack Mother   . Heart failure Father     Allergies   Allergen Reactions  . Hydrocodone     REACTION: migraines  . Ibuprofen Other (See Comments)    ulcers  . Oxycodone Hcl     REACTION: migraines  . Prednisone Other (See Comments)    ulcers  . Statins Other (See Comments)    Aches and pains  . Penicillins Swelling and Rash    Current Outpatient Prescriptions on File Prior to Visit  Medication Sig Dispense Refill  . apixaban (ELIQUIS) 5 MG TABS tablet Take 1 tablet (5 mg total) by mouth 2 (two) times daily.  60 tablet  6  . calcium-vitamin D (OSCAL WITH D) 500-200 MG-UNIT per tablet Take 1 tablet by mouth daily.      . colesevelam (WELCHOL) 625 MG tablet Take 1,875 mg by mouth daily.      . Flaxseed, Linseed, (FLAXSEED OIL) 1000 MG CAPS Take 1,000 mg by mouth at bedtime.       Marland Kitchen LORazepam (ATIVAN) 0.5 MG tablet Take 1 tablet (0.5 mg total) by mouth every 8 (eight) hours. For anxiety/sleep  90 tablet  2  . metoprolol succinate (TOPROL-XL) 50 MG 24 hr tablet Take 50 mg by mouth daily. Take with or immediately following a meal.      . Multiple Vitamin (MULTIVITAMIN) tablet Take 1 tablet by mouth daily.        . niacin 500 MG CR capsule Take 500 mg by  mouth daily after supper.       . Omega-3 Fatty Acids (FISH OIL) 1000 MG CAPS Take 1,000 mg by mouth 2 (two) times daily.       . ondansetron (ZOFRAN ODT) 4 MG disintegrating tablet Take 1 tablet (4 mg total) by mouth every 8 (eight) hours as needed for nausea.  20 tablet  0  . pantoprazole (PROTONIX) 40 MG tablet Take 40 mg by mouth every evening.       . promethazine (PHENERGAN) 25 MG tablet Take 1 tablet (25 mg total) by mouth every 6 (six) hours as needed for nausea.  30 tablet  0  . vitamin C (ASCORBIC ACID) 500 MG tablet Take 500 mg by mouth daily.        . [DISCONTINUED] sertraline (ZOLOFT) 25 MG tablet Take 1 tablet (25 mg total) by mouth daily.  30 tablet  2  . [DISCONTINUED] zolpidem (AMBIEN) 5 MG tablet Take 1 tablet (5 mg total) by mouth at bedtime as needed for sleep.  30 tablet  0    No current facility-administered medications on file prior to visit.    BP 162/70  Pulse 65  Temp(Src) 98.4 F (36.9 C) (Oral)  Resp 18  Wt 163 lb (73.936 kg)  BMI 24.79 kg/m2  SpO2 97%       Review of Systems  Constitutional: Positive for fatigue.  HENT: Negative for hearing loss, congestion, sore throat, rhinorrhea, dental problem, sinus pressure and tinnitus.   Eyes: Negative for pain, discharge and visual disturbance.  Respiratory: Negative for cough and shortness of breath.   Cardiovascular: Negative for chest pain, palpitations and leg swelling.  Gastrointestinal: Negative for nausea, vomiting, abdominal pain, diarrhea, constipation, blood in stool and abdominal distention.  Genitourinary: Negative for dysuria, urgency, frequency, hematuria, flank pain, vaginal bleeding, vaginal discharge, difficulty urinating, vaginal pain and pelvic pain.  Musculoskeletal: Negative for joint swelling, arthralgias and gait problem.  Skin: Negative for rash.  Neurological: Positive for weakness. Negative for dizziness, syncope, speech difficulty, numbness and headaches.  Hematological: Negative for adenopathy.  Psychiatric/Behavioral: Negative for behavioral problems, dysphoric mood and agitation. The patient is not nervous/anxious.        Objective:   Physical Exam  Constitutional: She is oriented to person, place, and time. She appears well-developed and well-nourished.  HENT:  Head: Normocephalic.  Right Ear: External ear normal.  Left Ear: External ear normal.  Mouth/Throat: Oropharynx is clear and moist.  Eyes: Conjunctivae and EOM are normal. Pupils are equal, round, and reactive to light.  Neck: Normal range of motion. Neck supple. No thyromegaly present.  Cardiovascular: Normal rate, regular rhythm, normal heart sounds and intact distal pulses.   Pulmonary/Chest: Effort normal and breath sounds normal.  Abdominal: Soft. Bowel sounds are normal. She exhibits no mass.  There is no tenderness.  Musculoskeletal: Normal range of motion.  Lymphadenopathy:    She has no cervical adenopathy.  Neurological: She is alert and oriented to person, place, and time.  Skin: Skin is warm and dry. No rash noted.  Psychiatric: She has a normal mood and affect. Her behavior is normal.          Assessment & Plan:    Status post hospitalization for acute dysentery. The patient has had a nice clinical response although remains somewhat fatigued. We'll slowly resume her usual level of activity. She is tolerating her diet well Paroxysmal atrial fibrillation Hypertension stable CAD stable History of anemia. We'll check followup electrolytes and CBC

## 2012-06-18 ENCOUNTER — Telehealth: Payer: Self-pay | Admitting: Internal Medicine

## 2012-06-18 NOTE — Telephone Encounter (Signed)
Spoke to pt told lab results came back normal and hemoglobin has increased to 10.6. Pt verbalized understanding.

## 2012-06-18 NOTE — Telephone Encounter (Signed)
Please call/notify patient that lab/test/procedure is normal 

## 2012-06-18 NOTE — Telephone Encounter (Signed)
Patient Information:  Caller Name: Jaeleah  Phone: [phone]  Patient: Marissa, Bowen  Gender: Female  DOB: 06-14-1926  Age: 79 Years  PCP: Eleonore Chiquito [facility])  Office Follow Up:  Does the office need to follow up with this patient?: Yes  Instructions For The Office: Called for lab results; ongoing weakness.  RN Note:  Called for lab results for 06/16/12;  Was anemic when hospitalized with Norovirus; MD was rechecking Hgb and electrolytes.  Since results were abnormal and no MD comments noted, lab results were NOT provided.  Declined triage for ongoing, severe weakness. Please call back with lab results from 06/16/12.  Symptoms  Reason For Call & Symptoms: ongoing weakness following hospitalization for Norovirus.  Reports never heard back regarding lab done 06/16/12. Declined triage  Reviewed Health History In EMR: N/A  Reviewed Medications In EMR: N/A  Reviewed Allergies In EMR: N/A  Reviewed Surgeries / Procedures: N/A  Date of Onset of Symptoms: 06/08/2012  Guideline(s) Used:  No Protocol Available - Sick Adult  Disposition Per Guideline:   Discuss with PCP and Callback by Nurse Today  Reason For Disposition Reached:   Nursing judgment  Advice Given:  N/A

## 2012-07-09 DIAGNOSIS — K5289 Other specified noninfective gastroenteritis and colitis: Secondary | ICD-10-CM | POA: Diagnosis not present

## 2012-07-09 DIAGNOSIS — R5381 Other malaise: Secondary | ICD-10-CM | POA: Diagnosis not present

## 2012-07-09 DIAGNOSIS — I2510 Atherosclerotic heart disease of native coronary artery without angina pectoris: Secondary | ICD-10-CM | POA: Diagnosis not present

## 2012-07-28 ENCOUNTER — Ambulatory Visit (INDEPENDENT_AMBULATORY_CARE_PROVIDER_SITE_OTHER): Payer: Medicare Other | Admitting: Cardiology

## 2012-07-28 ENCOUNTER — Encounter: Payer: Self-pay | Admitting: Cardiology

## 2012-07-28 ENCOUNTER — Other Ambulatory Visit: Payer: Self-pay | Admitting: Internal Medicine

## 2012-07-28 VITALS — BP 138/62 | HR 54 | Ht 68.0 in | Wt 158.0 lb

## 2012-07-28 DIAGNOSIS — E785 Hyperlipidemia, unspecified: Secondary | ICD-10-CM

## 2012-07-28 DIAGNOSIS — I10 Essential (primary) hypertension: Secondary | ICD-10-CM

## 2012-07-28 DIAGNOSIS — I4891 Unspecified atrial fibrillation: Secondary | ICD-10-CM

## 2012-07-28 DIAGNOSIS — I2510 Atherosclerotic heart disease of native coronary artery without angina pectoris: Secondary | ICD-10-CM

## 2012-07-28 DIAGNOSIS — I480 Paroxysmal atrial fibrillation: Secondary | ICD-10-CM

## 2012-07-28 LAB — CBC WITH DIFFERENTIAL/PLATELET
Basophils Absolute: 0 10*3/uL (ref 0.0–0.1)
Basophils Relative: 0.4 % (ref 0.0–3.0)
Hemoglobin: 10.6 g/dL — ABNORMAL LOW (ref 12.0–15.0)
Lymphocytes Relative: 22.3 % (ref 12.0–46.0)
Monocytes Relative: 10.6 % (ref 3.0–12.0)
Neutro Abs: 3.9 10*3/uL (ref 1.4–7.7)
RBC: 3.82 Mil/uL — ABNORMAL LOW (ref 3.87–5.11)

## 2012-07-28 LAB — IRON: Iron: 55 ug/dL (ref 42–145)

## 2012-07-28 NOTE — Patient Instructions (Signed)
Your physician recommends that you schedule a follow-up appointment in: 3 MONTHS with Dr Shirlee Latch (previous pt of Dr Riley Kill)  Your physician recommends that you have lab work today: CBC, Ferritin, TIBC, Iron  Your physician recommends that you continue on your current medications as directed. Please refer to the Current Medication list given to you today.

## 2012-07-28 NOTE — Progress Notes (Signed)
HPI:  This nice patient seems to be getting along pretty well.  She has had no visible bleeding, and she seems to be tolerating the apixaban without evidence of issues at least at present relative to the medication.  We stressed the need to follow her closely.    Current Outpatient Prescriptions  Medication Sig Dispense Refill  . apixaban (ELIQUIS) 5 MG TABS tablet Take 1 tablet (5 mg total) by mouth 2 (two) times daily.  60 tablet  6  . calcium-vitamin D (OSCAL WITH D) 500-200 MG-UNIT per tablet Take 1 tablet by mouth daily.      . colesevelam (WELCHOL) 625 MG tablet Take 1,875 mg by mouth daily.      . Flaxseed, Linseed, (FLAXSEED OIL) 1000 MG CAPS Take 1,000 mg by mouth at bedtime.       Marland Kitchen LORazepam (ATIVAN) 0.5 MG tablet Take 1 tablet (0.5 mg total) by mouth every 8 (eight) hours. For anxiety/sleep  90 tablet  2  . metoprolol succinate (TOPROL-XL) 50 MG 24 hr tablet Take 50 mg by mouth daily. Take with or immediately following a meal.      . Multiple Vitamin (MULTIVITAMIN) tablet Take 1 tablet by mouth daily.        . niacin 500 MG CR capsule Take 500 mg by mouth daily after supper.       . Omega-3 Fatty Acids (FISH OIL) 1000 MG CAPS Take 1,000 mg by mouth 2 (two) times daily.       . ondansetron (ZOFRAN ODT) 4 MG disintegrating tablet Take 1 tablet (4 mg total) by mouth every 8 (eight) hours as needed for nausea.  20 tablet  0  . pantoprazole (PROTONIX) 40 MG tablet Take 40 mg by mouth every evening.       . promethazine (PHENERGAN) 25 MG tablet Take 1 tablet (25 mg total) by mouth every 6 (six) hours as needed for nausea.  30 tablet  0  . vitamin C (ASCORBIC ACID) 500 MG tablet Take 500 mg by mouth daily.        Marland Kitchen aspirin 81 MG tablet Take 81 mg by mouth daily.      . [DISCONTINUED] sertraline (ZOLOFT) 25 MG tablet Take 1 tablet (25 mg total) by mouth daily.  30 tablet  2  . [DISCONTINUED] zolpidem (AMBIEN) 5 MG tablet Take 1 tablet (5 mg total) by mouth at bedtime as needed for sleep.   30 tablet  0   No current facility-administered medications for this visit.    Allergies  Allergen Reactions  . Hydrocodone     REACTION: migraines  . Ibuprofen Other (See Comments)    ulcers  . Oxycodone Hcl     REACTION: migraines  . Prednisone Other (See Comments)    ulcers  . Statins Other (See Comments)    Aches and pains  . Penicillins Swelling and Rash    Past Medical History  Diagnosis Date  . Acute posthemorrhagic anemia 10/23/2009  . BACK PAIN, LUMBAR 09/27/2009  . CORONARY ARTERY DISEASE 11/25/2006  . DIVERTICULITIS, HX OF 11/25/2006  . FATIGUE 07/21/2008  . GERD 11/25/2006  . HYPERLIPIDEMIA 11/25/2006  . HYPERTENSION 11/25/2006  . IBS 12/07/2008  . MIGRAINE WITH AURA 08/13/2007  . Muscle weakness (generalized) 09/27/2009  . OSTEOARTHRITIS 08/13/2007  . PALPITATIONS, HX OF 01/04/2010  . TRANSIENT ISCHEMIC ATTACK 04/13/2007  . Hiatal hernia   . Atrial fibrillation   . Family history of anesthesia complication     DAUGHTER HAS  NAUSEA    Past Surgical History  Procedure Laterality Date  . Coronary artery bypass graft    . Abdominal hysterectomy    . Tonsillectomy      Family History  Problem Relation Age of Onset  . Heart attack Mother   . Heart failure Father     History   Social History  . Marital Status: Married    Spouse Name: N/A    Number of Children: N/A  . Years of Education: N/A   Occupational History  . Not on file.   Social History Main Topics  . Smoking status: Never Smoker   . Smokeless tobacco: Never Used  . Alcohol Use: No  . Drug Use: No  . Sexually Active: No   Other Topics Concern  . Not on file   Social History Narrative  . No narrative on file    ROS: Please see the HPI.  All other systems reviewed and negative.  PHYSICAL EXAM:  BP 138/62  Pulse 54  Ht 5\' 8"  (1.727 m)  Wt 158 lb (71.668 kg)  BMI 24.03 kg/m2  SpO2 98%  General: Well developed, well nourished, in no acute distress. Head:  Normocephalic and  atraumatic. Neck: no JVD Lungs: Clear to auscultation and percussion. Heart: Normal S1 and S2.  Minimal at LSE.   Extremities: No clubbing or cyanosis. No edema. Neurologic: Alert and oriented x 3.  EKG:  SB.  Delay R wave progression. No acute changes.    ASSESSMENT AND PLAN:  1.  Stable  2.  CAD 3.  PAF 4.  Anemia  Check CBC.   RTC Dr. Shirlee Latch in three months.

## 2012-07-29 LAB — IRON AND TIBC
Iron: 61 ug/dL (ref 42–145)
TIBC: 396 ug/dL (ref 250–470)
UIBC: 335 ug/dL (ref 125–400)

## 2012-07-30 ENCOUNTER — Ambulatory Visit: Payer: Medicare Other | Admitting: Cardiology

## 2012-07-31 ENCOUNTER — Telehealth: Payer: Self-pay | Admitting: Cardiology

## 2012-07-31 NOTE — Telephone Encounter (Signed)
New Problem:    Patient called in wanting to know the results of her recent labs.  Please call back.

## 2012-07-31 NOTE — Telephone Encounter (Signed)
Spoke with pt, aware of lab results and aware dr Riley Kill has not reviewed and we will call her back with his recommendations.

## 2012-08-02 NOTE — Assessment & Plan Note (Signed)
No recurrent angina.  Medical therapy

## 2012-08-02 NOTE — Assessment & Plan Note (Signed)
Well controlled 

## 2012-08-02 NOTE — Assessment & Plan Note (Signed)
Managed by primary care. 

## 2012-08-02 NOTE — Assessment & Plan Note (Signed)
Currently in NSR.  Remains on abixaban for thromboprophylaxis.  Chads VASC risk previously noted (age, gender, vascular disease, prior CVA).  Bleeding risk being monitored.

## 2012-08-05 ENCOUNTER — Telehealth: Payer: Self-pay | Admitting: Cardiology

## 2012-08-05 NOTE — Telephone Encounter (Signed)
New Prob    Pt would like to know how strenuous she can get if she goes and does yard work. Would like to know what is safe for her to do. Please call.

## 2012-08-05 NOTE — Telephone Encounter (Signed)
Pt called to find out how much she can do, when she goes out to do yard work. Pt is aware that she needs to listen to her body and if she feels that  she needs to stop, she can do just that. Also to avoid extreme temperature. Pt is to becaurefull because pt is taken  blood thinner and can bruce easily. Pt verbalized understanding

## 2012-08-10 ENCOUNTER — Other Ambulatory Visit: Payer: Self-pay | Admitting: Internal Medicine

## 2012-08-17 ENCOUNTER — Telehealth: Payer: Self-pay | Admitting: Cardiology

## 2012-08-17 NOTE — Telephone Encounter (Signed)
I spoke with the pt and she called to make [place] aware that she is having episodes of confusion. The pt states she is getting confused about dates and times. Yesterday the pt thought it was Saturday, not until the pt's husband showed her the comics in the Sunday paper did she believe him. The pt feels she started having these symptoms the day after starting IRON. The pt's PCP is managing her anemia.  She is scheduled to see Dr Amador Cunas on Thursday.  I instructed the pt to contact her PCP and try to arrange an earlier appointment if possible.  The pt agreed with plan.

## 2012-08-17 NOTE — Telephone Encounter (Signed)
New problem    Pt having a problem w/eliquis

## 2012-08-18 ENCOUNTER — Ambulatory Visit (INDEPENDENT_AMBULATORY_CARE_PROVIDER_SITE_OTHER): Payer: Medicare Other | Admitting: Internal Medicine

## 2012-08-18 ENCOUNTER — Encounter: Payer: Self-pay | Admitting: Internal Medicine

## 2012-08-18 VITALS — BP 140/70 | HR 69 | Temp 98.8°F | Resp 18 | Wt 159.0 lb

## 2012-08-18 DIAGNOSIS — I480 Paroxysmal atrial fibrillation: Secondary | ICD-10-CM

## 2012-08-18 DIAGNOSIS — D500 Iron deficiency anemia secondary to blood loss (chronic): Secondary | ICD-10-CM | POA: Insufficient documentation

## 2012-08-18 DIAGNOSIS — G459 Transient cerebral ischemic attack, unspecified: Secondary | ICD-10-CM

## 2012-08-18 DIAGNOSIS — I10 Essential (primary) hypertension: Secondary | ICD-10-CM

## 2012-08-18 DIAGNOSIS — R5381 Other malaise: Secondary | ICD-10-CM

## 2012-08-18 DIAGNOSIS — I2510 Atherosclerotic heart disease of native coronary artery without angina pectoris: Secondary | ICD-10-CM | POA: Diagnosis not present

## 2012-08-18 DIAGNOSIS — I4891 Unspecified atrial fibrillation: Secondary | ICD-10-CM

## 2012-08-18 DIAGNOSIS — R5383 Other fatigue: Secondary | ICD-10-CM | POA: Diagnosis not present

## 2012-08-18 LAB — CBC WITH DIFFERENTIAL/PLATELET
Basophils Absolute: 0 10*3/uL (ref 0.0–0.1)
Eosinophils Relative: 2.2 % (ref 0.0–5.0)
MCV: 85.9 fl (ref 78.0–100.0)
Monocytes Absolute: 0.8 10*3/uL (ref 0.1–1.0)
Neutrophils Relative %: 61 % (ref 43.0–77.0)
Platelets: 203 10*3/uL (ref 150.0–400.0)
RDW: 17.2 % — ABNORMAL HIGH (ref 11.5–14.6)
WBC: 6.6 10*3/uL (ref 4.5–10.5)

## 2012-08-18 NOTE — Progress Notes (Signed)
Subjective:    Patient ID: Marissa Bowen, female    DOB: 05-12-1926, 79 y.o.   MRN: [id number]  HPI  79 year old patient who is seen today for followup. She has a history of paroxysmal atrial fibrillation and coronary artery disease. She has been on anticoagulant therapy as well as antiplatelet drugs. Aspirin was discontinued about one month ago due to  Anemia.  She is quite anxious today but in general seems to be doing well. She is quite concerned about anemia complications and stroke complications.  Past Medical History  Diagnosis Date  . Acute posthemorrhagic anemia 10/23/2009  . BACK PAIN, LUMBAR 09/27/2009  . CORONARY ARTERY DISEASE 11/25/2006  . DIVERTICULITIS, HX OF 11/25/2006  . FATIGUE 07/21/2008  . GERD 11/25/2006  . HYPERLIPIDEMIA 11/25/2006  . HYPERTENSION 11/25/2006  . IBS 12/07/2008  . MIGRAINE WITH AURA 08/13/2007  . Muscle weakness (generalized) 09/27/2009  . OSTEOARTHRITIS 08/13/2007  . PALPITATIONS, HX OF 01/04/2010  . TRANSIENT ISCHEMIC ATTACK 04/13/2007  . Hiatal hernia   . Atrial fibrillation   . Family history of anesthesia complication     DAUGHTER HAS  NAUSEA    History   Social History  . Marital Status: Married    Spouse Name: N/A    Number of Children: N/A  . Years of Education: N/A   Occupational History  . Not on file.   Social History Main Topics  . Smoking status: Never Smoker   . Smokeless tobacco: Never Used  . Alcohol Use: No  . Drug Use: No  . Sexually Active: No   Other Topics Concern  . Not on file   Social History Narrative  . No narrative on file    Past Surgical History  Procedure Laterality Date  . Coronary artery bypass graft    . Abdominal hysterectomy    . Tonsillectomy      Family History  Problem Relation Age of Onset  . Heart attack Mother   . Heart failure Father     Allergies  Allergen Reactions  . Hydrocodone     REACTION: migraines  . Ibuprofen Other (See Comments)    ulcers  . Oxycodone Hcl     REACTION:  migraines  . Prednisone Other (See Comments)    ulcers  . Statins Other (See Comments)    Aches and pains  . Penicillins Swelling and Rash    Current Outpatient Prescriptions on File Prior to Visit  Medication Sig Dispense Refill  . apixaban (ELIQUIS) 5 MG TABS tablet Take 1 tablet (5 mg total) by mouth 2 (two) times daily.  60 tablet  6  . calcium-vitamin D (OSCAL WITH D) 500-200 MG-UNIT per tablet Take 1 tablet by mouth daily.      . colesevelam (WELCHOL) 625 MG tablet Take 1,875 mg by mouth daily.      . Flaxseed, Linseed, (FLAXSEED OIL) 1000 MG CAPS Take 1,000 mg by mouth at bedtime.       Marland Kitchen LORazepam (ATIVAN) 0.5 MG tablet TAKE ONE TABLET EVERY EIGHT HOURS AS NEEDED FOR ANXIETY OR SLEEP  90 tablet  0  . metoprolol succinate (TOPROL-XL) 50 MG 24 hr tablet Take 50 mg by mouth daily. Take with or immediately following a meal.      . Multiple Vitamin (MULTIVITAMIN) tablet Take 1 tablet by mouth daily.        . niacin 500 MG CR capsule Take 500 mg by mouth daily after supper.       . Omega-3 Fatty  Acids (FISH OIL) 1000 MG CAPS Take 1,000 mg by mouth 2 (two) times daily.       . ondansetron (ZOFRAN ODT) 4 MG disintegrating tablet Take 1 tablet (4 mg total) by mouth every 8 (eight) hours as needed for nausea.  20 tablet  0  . pantoprazole (PROTONIX) 40 MG tablet Take 40 mg by mouth every evening.       . promethazine (PHENERGAN) 25 MG tablet Take 1 tablet (25 mg total) by mouth every 6 (six) hours as needed for nausea.  30 tablet  0  . vitamin C (ASCORBIC ACID) 500 MG tablet Take 500 mg by mouth daily.        . WELCHOL 625 MG tablet TAKE 3 TABLETS TWICE DAILY WITH A MEAL  360 tablet  1  . [DISCONTINUED] sertraline (ZOLOFT) 25 MG tablet Take 1 tablet (25 mg total) by mouth daily.  30 tablet  2  . [DISCONTINUED] zolpidem (AMBIEN) 5 MG tablet Take 1 tablet (5 mg total) by mouth at bedtime as needed for sleep.  30 tablet  0   No current facility-administered medications on file prior to visit.     BP 140/70  Pulse 69  Temp(Src) 98.8 F (37.1 C) (Oral)  Resp 18  Wt 159 lb (72.122 kg)  BMI 24.18 kg/m2  SpO2 96%      Review of Systems  Constitutional: Positive for fatigue.  HENT: Negative for hearing loss, congestion, sore throat, rhinorrhea, dental problem, sinus pressure and tinnitus.   Eyes: Negative for pain, discharge and visual disturbance.  Respiratory: Negative for cough and shortness of breath.   Cardiovascular: Negative for chest pain, palpitations and leg swelling.  Gastrointestinal: Negative for nausea, vomiting, abdominal pain, diarrhea, constipation, blood in stool and abdominal distention.  Genitourinary: Negative for dysuria, urgency, frequency, hematuria, flank pain, vaginal bleeding, vaginal discharge, difficulty urinating, vaginal pain and pelvic pain.  Musculoskeletal: Negative for joint swelling, arthralgias and gait problem.  Skin: Negative for rash.  Neurological: Negative for dizziness, syncope, speech difficulty, weakness, numbness and headaches.  Hematological: Negative for adenopathy.  Psychiatric/Behavioral: Negative for behavioral problems, dysphoric mood and agitation. The patient is nervous/anxious.        Objective:   Physical Exam  Constitutional: She is oriented to person, place, and time. She appears well-developed and well-nourished.  HENT:  Head: Normocephalic.  Right Ear: External ear normal.  Left Ear: External ear normal.  Mouth/Throat: Oropharynx is clear and moist.  Eyes: Conjunctivae and EOM are normal. Pupils are equal, round, and reactive to light.  Neck: Normal range of motion. Neck supple. No thyromegaly present.  Cardiovascular: Normal rate, regular rhythm, normal heart sounds and intact distal pulses.   Pulses regular rate 70  Pulmonary/Chest: Effort normal and breath sounds normal.  Abdominal: Soft. Bowel sounds are normal. She exhibits no mass. There is no tenderness.  Musculoskeletal: Normal range of motion.   Lymphadenopathy:    She has no cervical adenopathy.  Neurological: She is alert and oriented to person, place, and time.  Skin: Skin is warm and dry. No rash noted.  Psychiatric: She has a normal mood and affect. Her behavior is normal.          Assessment & Plan:   History of anemia. Patient has been on iron supplementation we'll check a followup CBC. We'll continue to avoid aspirin and continue on anticoagulation  Paroxysmal atrial fibrillation  Hypertension stable

## 2012-08-18 NOTE — Patient Instructions (Signed)
Limit your sodium (Salt) intake    It is important that you exercise regularly, at least 20 minutes 3 to 4 times per week.  If you develop chest pain or shortness of breath seek  medical attention. 

## 2012-08-20 ENCOUNTER — Ambulatory Visit: Payer: Medicare Other | Admitting: Internal Medicine

## 2012-08-25 ENCOUNTER — Emergency Department (HOSPITAL_COMMUNITY)
Admission: EM | Admit: 2012-08-25 | Discharge: 2012-08-25 | Disposition: A | Discharge status: Home / Self Care | Payer: Medicare Other | Attending: Emergency Medicine | Admitting: Emergency Medicine

## 2012-08-25 ENCOUNTER — Encounter (HOSPITAL_COMMUNITY): Payer: Self-pay

## 2012-08-25 ENCOUNTER — Emergency Department (HOSPITAL_COMMUNITY): Payer: Medicare Other

## 2012-08-25 ENCOUNTER — Other Ambulatory Visit: Payer: Self-pay | Admitting: Physician Assistant

## 2012-08-25 DIAGNOSIS — R5381 Other malaise: Secondary | ICD-10-CM | POA: Diagnosis not present

## 2012-08-25 DIAGNOSIS — R5383 Other fatigue: Secondary | ICD-10-CM | POA: Diagnosis not present

## 2012-08-25 DIAGNOSIS — Z79899 Other long term (current) drug therapy: Secondary | ICD-10-CM | POA: Diagnosis not present

## 2012-08-25 DIAGNOSIS — K219 Gastro-esophageal reflux disease without esophagitis: Secondary | ICD-10-CM | POA: Insufficient documentation

## 2012-08-25 DIAGNOSIS — Z8719 Personal history of other diseases of the digestive system: Secondary | ICD-10-CM | POA: Insufficient documentation

## 2012-08-25 DIAGNOSIS — M1990 Unspecified osteoarthritis, unspecified site: Secondary | ICD-10-CM | POA: Insufficient documentation

## 2012-08-25 DIAGNOSIS — I2510 Atherosclerotic heart disease of native coronary artery without angina pectoris: Secondary | ICD-10-CM | POA: Insufficient documentation

## 2012-08-25 DIAGNOSIS — E785 Hyperlipidemia, unspecified: Secondary | ICD-10-CM | POA: Diagnosis not present

## 2012-08-25 DIAGNOSIS — I10 Essential (primary) hypertension: Secondary | ICD-10-CM | POA: Insufficient documentation

## 2012-08-25 DIAGNOSIS — I4891 Unspecified atrial fibrillation: Secondary | ICD-10-CM | POA: Insufficient documentation

## 2012-08-25 DIAGNOSIS — D62 Acute posthemorrhagic anemia: Secondary | ICD-10-CM | POA: Diagnosis not present

## 2012-08-25 DIAGNOSIS — R531 Weakness: Secondary | ICD-10-CM

## 2012-08-25 DIAGNOSIS — R002 Palpitations: Secondary | ICD-10-CM | POA: Diagnosis not present

## 2012-08-25 DIAGNOSIS — Z880 Allergy status to penicillin: Secondary | ICD-10-CM | POA: Insufficient documentation

## 2012-08-25 DIAGNOSIS — Z8679 Personal history of other diseases of the circulatory system: Secondary | ICD-10-CM | POA: Insufficient documentation

## 2012-08-25 DIAGNOSIS — R404 Transient alteration of awareness: Secondary | ICD-10-CM | POA: Diagnosis not present

## 2012-08-25 DIAGNOSIS — R110 Nausea: Secondary | ICD-10-CM | POA: Diagnosis not present

## 2012-08-25 DIAGNOSIS — Z8673 Personal history of transient ischemic attack (TIA), and cerebral infarction without residual deficits: Secondary | ICD-10-CM | POA: Diagnosis not present

## 2012-08-25 DIAGNOSIS — Z951 Presence of aortocoronary bypass graft: Secondary | ICD-10-CM | POA: Diagnosis not present

## 2012-08-25 DIAGNOSIS — I480 Paroxysmal atrial fibrillation: Secondary | ICD-10-CM

## 2012-08-25 HISTORY — DX: Anxiety disorder, unspecified: F41.9

## 2012-08-25 HISTORY — DX: Disorder of arteries and arterioles, unspecified: I77.9

## 2012-08-25 HISTORY — DX: Cerebral infarction, unspecified: I63.9

## 2012-08-25 HISTORY — DX: Gastrointestinal hemorrhage, unspecified: K92.2

## 2012-08-25 HISTORY — DX: Anemia, unspecified: D64.9

## 2012-08-25 HISTORY — DX: Peripheral vascular disease, unspecified: I73.9

## 2012-08-25 LAB — TROPONIN I: Troponin I: <0.3 ng/mL (ref <0.30)

## 2012-08-25 LAB — BASIC METABOLIC PANEL
CO2: 30 mEq/L (ref 19–32)
Chloride: 102 mEq/L (ref 96–112)
GFR calc Af Amer: >90 mL/min (ref >90)
Potassium: 4 mEq/L (ref 3.5–5.1)
Sodium: 140 mEq/L (ref 135–145)

## 2012-08-25 LAB — CBC WITH DIFFERENTIAL/PLATELET
Basophils Absolute: 0 10*3/uL (ref 0.0–0.1)
Eosinophils Relative: 1 % (ref 0–5)
Lymphocytes Relative: 17 % (ref 12–46)
Lymphs Abs: 1.3 10*3/uL (ref 0.7–4.0)
MCV: 87.4 fL (ref 78.0–100.0)
Neutro Abs: 5.8 10*3/uL (ref 1.7–7.7)
Neutrophils Relative %: 74 % (ref 43–77)
Platelets: 223 10*3/uL (ref 150–400)
RBC: 4.45 MIL/uL (ref 3.87–5.11)
RDW: 15.5 % (ref 11.5–15.5)
WBC: 7.7 10*3/uL (ref 4.0–10.5)

## 2012-08-25 LAB — URINALYSIS, ROUTINE W REFLEX MICROSCOPIC
Glucose, UA: NEGATIVE mg/dL
Nitrite: NEGATIVE
pH: 7 (ref 5.0–8.0)

## 2012-08-25 LAB — URINE MICROSCOPIC-ADD ON

## 2012-08-25 MED ORDER — DILTIAZEM HCL ER COATED BEADS 180 MG PO CP24: 180.0000 mg | ORAL_CAPSULE | Freq: Every day | ORAL | Status: DC

## 2012-08-25 MED ORDER — METOPROLOL TARTRATE 1 MG/ML IV SOLN
5.0000 mg | Freq: Once | INTRAVENOUS | Status: AC
  Administered 2012-08-25: 5 mg via INTRAVENOUS
  Filled 2012-08-25: qty 5

## 2012-08-25 MED ORDER — DILTIAZEM HCL ER COATED BEADS 180 MG PO CP24
180.0000 mg | ORAL_CAPSULE | Freq: Every day | ORAL | Status: DC
  Administered 2012-08-25: 180 mg via ORAL
  Filled 2012-08-25: qty 1

## 2012-08-25 MED ORDER — DRONEDARONE HCL 400 MG PO TABS: 400.0000 mg | ORAL_TABLET | Freq: Two times a day (BID) | ORAL | Status: DC

## 2012-08-25 MED ORDER — DRONEDARONE HCL 400 MG PO TABS
400.0000 mg | ORAL_TABLET | Freq: Two times a day (BID) | ORAL | Status: DC
  Administered 2012-08-25: 400 mg via ORAL
  Filled 2012-08-25 (×2): qty 1

## 2012-08-25 NOTE — Consult Note (Signed)
Cardiology Consult Note  Patient ID: Marissa Bowen MRN: [id number], DOB: [birth year] Date of Encounter: 08/25/2012, 2:57 PM Primary Physician: Marissa Boga, MD Primary Cardiologist: Marissa Bowen --> Marissa Bowen  Chief Complaint: palpitations Reason for Consultation: recurrent atrial fibrillation  HPI: Ms. Marissa Bowen is an 79 y/o F with history of CAD s/p remote CABG, PAF since 02/2012 on Eliquis, HTN, CVA, and anxiety who presented to [facility] with complaints of palpitations that began around 9pm last night with evidence for recurrrent afib on EKG. She has been nervous about the storms coming and she feels this triggered it. She has been having episodes of atrial fibrillation weekly. She attributes the frequency to increased stress. She has a history of anxiety, and has been worried about her risk of stroke with afib but also risk of anemia on Eliquis. She has not had any recent bleeding and Hgb is stable at 12.6. 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 She has not had any CP, SOB, diaphoresis, presyncope or syncope. She has had very mild nausea. Occasionally her face flushes with the afib. She is trying to increase her exercise by walking and is not limited by CP or SOB but does feel weak at times. No falls. In the ER, HR has been 90-140. She received 5mg  IV Lopressor x 2; HR currently ~100. The "fluttering has calmed down." She just feels somewhat tired. Labs unremarkable including troponin neg x 1, CXR without significant change. No LEE, orthopnea, weight change, fever, or chills. She endorses med compliance.  Past Medical History  Diagnosis Date  . BACK PAIN, LUMBAR 09/27/2009  . CORONARY ARTERY DISEASE     a. s/p remote CABG 1997, 3V.  Marland Kitchen DIVERTICULITIS, HX OF 11/25/2006  . FATIGUE 07/21/2008  . GERD 11/25/2006  . HYPERLIPIDEMIA   . HYPERTENSION   . IBS 12/07/2008  . MIGRAINE WITH AURA 08/13/2007  . Muscle weakness  (generalized) 09/27/2009  . OSTEOARTHRITIS 08/13/2007  . TRANSIENT ISCHEMIC ATTACK 04/13/2007  . Hiatal hernia   . Paroxysmal atrial fibrillation Dx 02/2012  . Anxiety   . CVA (cerebral infarction)     a. Thalamic infarct 09/2009.  Marland Kitchen Anemia   . GI bleed     a. 09/2009: secondary to antral ulcer.  . Mild carotid artery disease     a. 0-39% bilat 05/2011 (f/u recommended 05/2013).     Most Recent Cardiac Studies: 2D Echo 04/03/12 Study Conclusions - Left ventricle: The cavity size was normal. Wall thickness was increased in a pattern of mild LVH. Systolic function was normal. The estimated ejection fraction was in the range of 60% to 65%. Wall motion was normal; there were no regional wall motion abnormalities. Features are consistent with a pseudonormal left ventricular filling pattern, with concomitant abnormal relaxation and increased filling pressure (grade 2 diastolic dysfunction). - Aortic valve: There was no stenosis. Trivial regurgitation. - Mitral valve: Trivial regurgitation. - Left atrium: The atrium was mildly dilated. - Right ventricle: The cavity size was normal. Systolic function was normal. - Tricuspid valve: Mild-moderate regurgitation. Peak RV-RA gradient: 32mm Hg (S). - Pulmonary arteries: PA peak pressure: 37mm Hg (S). - Inferior vena cava: The vessel was normal in size; the respirophasic diameter changes were in the normal range (= 50%); findings are consistent with normal central venous pressure. Impressions: - Normal LV size with mild LV hypertrophy. EF 60-65%. Moderate diastolic dysfunction. Normal RV size and systolic function. Borderline pulmonary hypertension.  Biatrial enlargement.  TEE 09/2009 Using digital technique, an Omniplane probe was advanced into the distal  Esophagus without incident.  Transgastric imaging revealed normal LV function, EF was 60%.  There was no mural apical thrombus.  There was mild LVH. Mitral valve was mildly thickened with  trivial MR.  Left atrium and right-sided cardiac chambers were normal.  The aortic valve was trileaflet and structurally normal.  Atrial septum was intact by 2-D and color flow.  Bubble study was negative for right-to-left shunt.  The left atrial appendage was small with no spontaneous contrast and no thrombus.  Imaging of the aorta showed no significant debris. FINAL IMPRESSION: 1. No source of embolus. 2. Negative bubble study with no patent foramen ovale. 3. Mild left ventricular hypertrophy, ejection fraction 60%. 4. Trivial mitral regurgitation. 5. Normal atrium. 6. Normal right ventricle. 7. Trileaflet aortic valve with mild sclerosis. 8. Normal aorta.    Surgical History:  Past Surgical History  Procedure Laterality Date  . Coronary artery bypass graft    . Abdominal hysterectomy    . Tonsillectomy       Home Meds: Prior to Admission medications   Medication Sig Start Date End Date Taking? Authorizing Provider  apixaban (ELIQUIS) 5 MG TABS tablet Take 1 tablet (5 mg total) by mouth 2 (two) times daily. 06/03/12  Yes Herby Abraham, MD  calcium-vitamin D (OSCAL WITH D) 500-200 MG-UNIT per tablet Take 1 tablet by mouth daily.   Yes Historical Provider, MD  colesevelam (WELCHOL) 625 MG tablet Take 3,750 mg by mouth daily.  06/14/11  Yes Gordy Savers, MD  ferrous sulfate 325 (65 FE) MG tablet Take 325 mg by mouth daily with breakfast.   Yes Historical Provider, MD  Flaxseed, Linseed, (FLAXSEED OIL) 1000 MG CAPS Take 1,000 mg by mouth at bedtime.    Yes Historical Provider, MD  LORazepam (ATIVAN) 0.5 MG tablet Take 0.5 mg by mouth every 8 (eight) hours as needed for anxiety.   Yes Historical Provider, MD  metoprolol succinate (TOPROL-XL) 50 MG 24 hr tablet Take 50 mg by mouth daily. Take with or immediately following a meal.   Yes Historical Provider, MD  Multiple Vitamin (MULTIVITAMIN) tablet Take 1 tablet by mouth daily.     Yes Historical Provider, MD  niacin 500 MG CR  capsule Take 500 mg by mouth daily after supper.    Yes Historical Provider, MD  Omega-3 Fatty Acids (FISH OIL) 1000 MG CAPS Take 1,000 mg by mouth 2 (two) times daily.    Yes Historical Provider, MD  pantoprazole (PROTONIX) 40 MG tablet Take 40 mg by mouth every evening.    Yes Historical Provider, MD  vitamin C (ASCORBIC ACID) 500 MG tablet Take 500 mg by mouth daily.     Yes Historical Provider, MD    Allergies:  Allergies  Allergen Reactions  . Hydrocodone     REACTION: migraines  . Ibuprofen Other (See Comments)    ulcers  . Oxycodone Hcl     REACTION: migraines  . Prednisone Other (See Comments)    ulcers  . Statins Other (See Comments)    Aches and pains  . Penicillins Swelling and Rash    History   Social History  . Marital Status: Married    Spouse Name: N/A    Number of Children: N/A  . Years of Education: N/A   Occupational History  . Not on file.   Social History Main Topics  . Smoking status: Never Smoker   .  Smokeless tobacco: Never Used  . Alcohol Use: No  . Drug Use: No  . Sexually Active: No   Other Topics Concern  . Not on file   Social History Narrative  . No narrative on file     Family History  Problem Relation Age of Onset  . Heart attack Mother   . Heart failure Father     Review of Systems: General: negative for chills, fever, night sweats or weight changes.  Cardiovascular: negative for edema, orthopnea, paroxysmal nocturnal dyspnea Dermatological: negative for rash Respiratory: negative for cough or wheezing Urologic: negative for hematuria Abdominal: negative for vomiting, diarrhea, bright red blood per rectum, melena, or hematemesis Neurologic: negative for visual changes, syncope, or dizziness All other systems reviewed and are otherwise negative except as noted above.  Labs:   Lab Results  Component Value Date   WBC 7.7 08/25/2012   HGB 12.6 08/25/2012   HCT 38.9 08/25/2012   MCV 87.4 08/25/2012   PLT 223 08/25/2012      Recent Labs Lab 08/25/12 1030  NA 140  K 4.0  CL 102  CO2 30  BUN 14  CREATININE 0.61  CALCIUM 9.7  GLUCOSE 102*    Recent Labs  08/25/12 1031  TROPONINI <0.30   Lab Results  Component Value Date   CHOL 152 06/05/2012   HDL 53.10 06/05/2012   LDLCALC 84 06/05/2012   TRIG 74.0 06/05/2012    Radiology/Studies:  Dg Chest 2 View 08/25/2012  *RADIOLOGY REPORT*  Clinical Data: Palpitations  CHEST - 2 VIEW  Comparison: 06/08/12  Findings: Cardiomediastinal silhouette is stable.  Status post CABG.  No acute infiltrate or pleural effusion.  No pulmonary edema.  Atherosclerotic calcifications of thoracic aorta.  Stable degenerative changes thoracic spine.  IMPRESSION: No active disease.  No significant change.   Original Report Authenticated By: Natasha Mead, M.D.     EKG: atrial fibrillation 107bpm with nonspecific ST-T changes  Including TWI I, avL, mild ST depression/TW abnormality I, avF, V4-V6 which is new from prior ? repol abnormality  Physical Exam: Blood pressure 99/69, pulse 92, temperature 98 F (36.7 C), temperature source Oral, resp. rate 22, height 5' 8.5" (1.74 m), weight 159 lb (72.122 kg), SpO2 95.00%. General: Well developed, well nourished elderly WF in no acute distress. Head: Normocephalic, atraumatic, sclera non-icteric, no xanthomas, nares are without discharge.  Neck: Negative for carotid bruits. JVP 8 cm Lungs: Clear bilaterally to auscultation without wheezes, rales, or rhonchi. Breathing is unlabored. Heart: Irregularly irregular with S1 S2. No murmurs, rubs, or gallops appreciated. Abdomen: Soft, non-tender, non-distended with normoactive bowel sounds. No hepatomegaly. No rebound/guarding. No obvious abdominal masses. Msk:  Strength and tone appear normal for age. Extremities: No clubbing or cyanosis. No edema.  Distal pedal pulses are 2+ and equal bilaterally. Neuro: Alert and oriented X 3. No focal deficit. No facial asymmetry. Moves all extremities  spontaneously. Psych:  Responds to questions appropriately with a normal affect.   ASSESSMENT AND PLAN:  1. Paroxysmal atrial fibrillation with RVR 2. CAD s/p CABG with ST-T changes on EKG 3. Anxiety 4. Anemia, improved 5. HTN 6. Prior CVA  Will discuss further management with MD.   Signed, Ronie Spies PA-C 08/25/2012, 2:57 PM  Patient seen with PA, agree with the above note.  She has gone into atrial fibrillation with RVR (h/o PAF), started last night.  She had been very anxious last night and thinks that triggered the atrial fibrillation.  She feels "weak" but no dyspnea  or chest pain.  ECG showed some very slight anterolateral ST depression.  At baseline, she does well.   Her HR is in the 100s after getting metoprolol.  She feels better with HR lower.    I will give the patient diltiazem CD 180 mg x 1 now.  If HR stays mostly < 100, then she can go home on diltiazem CD 180 mg daily.  She is very symptomatic when she goes into atrial fibrillation.  I will also start her on Multaq (to be take with meals) to try to keep her in NSR (h/o CAD, no history of CHF).  She will need to be seen in the office Thursday or Friday if discharged.  If she remains in atrial fibrillation, would arrange for cardioversion on Multaq (she has been on Eliquis without missing a dose for > 1 month).    If HR stays signifcantly elevated, she will need to be admitted and started on diltiazem gtt.  She could be cardioverted tomorrow.   [name] 08/25/2012 3:09 PM

## 2012-08-25 NOTE — ED Provider Notes (Signed)
I have personally seen and examined the patient.  I have discussed the plan of care with the resident.  I have reviewed the documentation on PMH/FH/Soc. History.  I have reviewed the documentation of the resident and agree.  Doug Sou, MD 08/25/12 [phone]

## 2012-08-25 NOTE — ED Provider Notes (Signed)
History     CSN: [id number]  Arrival date & time 08/25/12  0906   First MD Initiated Contact with Patient 08/25/12 0913      Chief Complaint  Patient presents with  . Palpitations    (Consider location/radiation/quality/duration/timing/severity/associated sxs/prior treatment) HPI Comments: 87 y F with PMH of afib (Toprol-XL and Eliquis), CAD s/p CABG, GERD, HTN and HLD here for palpitation since last night and generalized fatigue over the past 2 days.  She denies CP, abd pain, SOB, diaphoresis, dysuria/frequency, melena, change in bowel habits, loss of appetite and other complaints. She does report feeling stressed out about the severe weather alerts.  No missed doses of medications.   Patient is a 79 y.o. female presenting with palpitations. The history is provided by the patient.  Palpitations  This is a recurrent problem. Episode onset: last night. The problem occurs constantly. The problem has not changed since onset.Associated with: stress over the severe weather reports. Associated symptoms include irregular heartbeat. Pertinent negatives include no diaphoresis, no fever, no chest pain, no chest pressure, no exertional chest pressure, no near-syncope, no orthopnea, no PND, no syncope, no abdominal pain, no nausea, no vomiting, no headaches, no lower extremity edema, no dizziness, no cough and no shortness of breath. She has tried nothing for the symptoms. Her past medical history is significant for heart disease.    Past Medical History  Diagnosis Date  . Acute posthemorrhagic anemia 10/23/2009  . BACK PAIN, LUMBAR 09/27/2009  . CORONARY ARTERY DISEASE 11/25/2006  . DIVERTICULITIS, HX OF 11/25/2006  . FATIGUE 07/21/2008  . GERD 11/25/2006  . HYPERLIPIDEMIA 11/25/2006  . HYPERTENSION 11/25/2006  . IBS 12/07/2008  . MIGRAINE WITH AURA 08/13/2007  . Muscle weakness (generalized) 09/27/2009  . OSTEOARTHRITIS 08/13/2007  . PALPITATIONS, HX OF 01/04/2010  . TRANSIENT ISCHEMIC ATTACK 04/13/2007   . Hiatal hernia   . Atrial fibrillation   . Family history of anesthesia complication     DAUGHTER HAS  NAUSEA    Past Surgical History  Procedure Laterality Date  . Coronary artery bypass graft    . Abdominal hysterectomy    . Tonsillectomy      Family History  Problem Relation Age of Onset  . Heart attack Mother   . Heart failure Father     History  Substance Use Topics  . Smoking status: Never Smoker   . Smokeless tobacco: Never Used  . Alcohol Use: No    OB History   Grav Para Term Preterm Abortions TAB SAB Ect Mult Living                  Review of Systems  Constitutional: Negative for fever and diaphoresis.  Respiratory: Negative for cough and shortness of breath.   Cardiovascular: Positive for palpitations. Negative for chest pain, orthopnea, syncope, PND and near-syncope.  Gastrointestinal: Negative for nausea, vomiting and abdominal pain.  Neurological: Negative for dizziness and headaches.  All other systems reviewed and are negative.    Allergies  Hydrocodone; Ibuprofen; Oxycodone hcl; Prednisone; Statins; and Penicillins  Home Medications   Current Outpatient Rx  Name  Route  Sig  Dispense  Refill  . apixaban (ELIQUIS) 5 MG TABS tablet   Oral   Take 1 tablet (5 mg total) by mouth 2 (two) times daily.   60 tablet   6   . calcium-vitamin D (OSCAL WITH D) 500-200 MG-UNIT per tablet   Oral   Take 1 tablet by mouth daily.         Marland Kitchen  colesevelam (WELCHOL) 625 MG tablet   Oral   Take 1,875 mg by mouth daily.         . ferrous sulfate 325 (65 FE) MG tablet   Oral   Take 325 mg by mouth daily with breakfast.         . Flaxseed, Linseed, (FLAXSEED OIL) 1000 MG CAPS   Oral   Take 1,000 mg by mouth at bedtime.          Marland Kitchen LORazepam (ATIVAN) 0.5 MG tablet      TAKE ONE TABLET EVERY EIGHT HOURS AS NEEDED FOR ANXIETY OR SLEEP   90 tablet   0   . metoprolol succinate (TOPROL-XL) 50 MG 24 hr tablet   Oral   Take 50 mg by mouth daily.  Take with or immediately following a meal.         . Multiple Vitamin (MULTIVITAMIN) tablet   Oral   Take 1 tablet by mouth daily.           . niacin 500 MG CR capsule   Oral   Take 500 mg by mouth daily after supper.          . Omega-3 Fatty Acids (FISH OIL) 1000 MG CAPS   Oral   Take 1,000 mg by mouth 2 (two) times daily.          . ondansetron (ZOFRAN ODT) 4 MG disintegrating tablet   Oral   Take 1 tablet (4 mg total) by mouth every 8 (eight) hours as needed for nausea.   20 tablet   0   . pantoprazole (PROTONIX) 40 MG tablet   Oral   Take 40 mg by mouth every evening.          . promethazine (PHENERGAN) 25 MG tablet   Oral   Take 1 tablet (25 mg total) by mouth every 6 (six) hours as needed for nausea.   30 tablet   0   . vitamin C (ASCORBIC ACID) 500 MG tablet   Oral   Take 500 mg by mouth daily.           . WELCHOL 625 MG tablet      TAKE 3 TABLETS TWICE DAILY WITH A MEAL   360 tablet   1     BP 128/93  Pulse 107  Temp(Src) 98 F (36.7 C) (Oral)  Resp 18  Ht 5' 8.5" (1.74 m)  Wt 159 lb (72.122 kg)  BMI 23.82 kg/m2  SpO2 95%  Physical Exam  Vitals reviewed. Constitutional: She is oriented to person, place, and time. She appears well-developed and well-nourished.  HENT:  Right Ear: External ear normal.  Left Ear: External ear normal.  Mouth/Throat: No oropharyngeal exudate.  Eyes: Conjunctivae and EOM are normal. Pupils are equal, round, and reactive to light.  Neck: Normal range of motion. Neck supple.  Cardiovascular: Normal heart sounds and intact distal pulses.  An irregularly irregular rhythm present. Exam reveals no gallop and no friction rub.   No murmur heard. HR 90s-110s  Pulmonary/Chest: Effort normal and breath sounds normal.  Abdominal: Soft. Bowel sounds are normal. She exhibits no distension. There is no tenderness.  Musculoskeletal: Normal range of motion. She exhibits no edema.  Neurological: She is alert and oriented  to person, place, and time. No cranial nerve deficit.  Skin: Skin is warm and dry. No rash noted.  Psychiatric: She has a normal mood and affect.    ED Course  Procedures (including critical care  time)  Labs Reviewed  BASIC METABOLIC PANEL - Abnormal; Notable for the following:    Glucose, Bld 102 (*)    GFR calc non Af Amer 82 (*)    All other components within normal limits  URINALYSIS, ROUTINE W REFLEX MICROSCOPIC - Abnormal; Notable for the following:    Leukocytes, UA MODERATE (*)    All other components within normal limits  CBC WITH DIFFERENTIAL  TROPONIN I  URINE MICROSCOPIC-ADD ON   Dg Chest 2 View  08/25/2012  *RADIOLOGY REPORT*  Clinical Data: Palpitations  CHEST - 2 VIEW  Comparison: 06/08/12  Findings: Cardiomediastinal silhouette is stable.  Status post CABG.  No acute infiltrate or pleural effusion.  No pulmonary edema.  Atherosclerotic calcifications of thoracic aorta.  Stable degenerative changes thoracic spine.  IMPRESSION: No active disease.  No significant change.   Original Report Authenticated By: Natasha Mead, M.D.      Date: 08/25/2012  Rate: 107  Rhythm: atrial fibrillation  QRS Axis: normal  Intervals: normal  ST/T Wave abnormalities: ST depressions laterally  Conduction Disutrbances:none  Narrative Interpretation: A fib, STD laterally that is more prominent compared to prior, TW upright in V2, previously inverted  Old EKG Reviewed: changes noted    1. Paroxysmal atrial fibrillation   2. Weakness       MDM   31 y F with PMH of afib (Toprol-XL and Eliquis), CAD s/p CABG, GERD, HTN and HLD here for palpitation since last night and generalized fatigue over the past 2 days.  She denies CP, abd pain, SOB, diaphoresis, dysuria/frequency, melena, change in bowel habits, loss of appetite and other complaints. She does report feeling stressed out about the severe weather alerts.  No missed doses of medications.  AF, HR irr and ranging 90s-110s.  BPs adequate.   Mentating appropriately.  Lungs clear.  Abd soft, NT. No edema.  Diff Dx: Electrolyte abnl, Anemia, UTI, occult PNA, atypical ACS.  CBC, BMP, trop, CXR, UA. EKG with STD in lateral leads more pronounced when compared to prior EKG and with possible psedonormalization of TWI in V2.    12:49 PM HR in 90s after 2nd dose of 5mg  Lopressor.  Cards consulted for recommendations.  3:28 PM Cardiology will administer diltiazem and assess for effectiveness.  They are planning to make a dispo decision based on how she responds to that medication.  Pt seen in conjunction with my attending, Dr. Ethelda Chick.  Oleh Genin, MD PGY-II [facility] Emergency Medicine Resident   Oleh Genin, MD 08/25/12 [phone]

## 2012-08-25 NOTE — ED Provider Notes (Signed)
Patient complains of irregular heartbeat since last night accompanied by generalized weakness she is presently asymptomatic on exam alert Glasgow Coma Score 15 heart irregularly irregular lungs clear auscultation neurologic moves all extremities well cranial nerves II through XII intact  Doug Sou, MD 08/25/12 1405

## 2012-08-25 NOTE — ED Notes (Signed)
Cardiology at the bedside.

## 2012-08-25 NOTE — ED Notes (Signed)
Presents via EMS with palpitations that began around 9 pm last night. Hx of afib. No pain present. Per EMS pt has extreme weakness that accompanies the palpitations. Pt told EMS that she has been nervous about the storms and sometimes that accompanies these episodes. Recently started taking eloquist. HR 80-140 en route via EMS.

## 2012-08-25 NOTE — Progress Notes (Signed)
Our team will reassess the patient in the next few hours to determine if HR better controlled.   If HR mostly <100 and feels well, can discharge home from ER. Rx already sent in for Diltiazem CD 180mg  daily and Multaq 400mg  BID to patient's pharmacy. She has f/u appointment tomorrow at 9am with Jacolyn Reedy, PA-C.  If HR remains 100+ and she feels poorly, she should be admitted. Home meds have already been entered under sign and held. She has admission orders that have been pended - please go to Active orders to engage the Sign&Held function if she needs to be admitted. She may need addition of diltiazem gtt. Will sign out these instructions to oncoming PA. Pt also aware of plan.  Dayna Dunn PA-C

## 2012-08-25 NOTE — ED Provider Notes (Signed)
16:15. Care transferred from Dr. Beverely Pace; apprised of course. Pt is a 79 yo F w/hx of atrial fibrillation (on metoprolol and eliquis) who presented for palpitations. Atrial fibrillation in the 110's; Dr. Beverely Pace spoke with Cardiology who recommended dose of 180mg  Diltiazem and re-evaluation 2 hrs later. Patient has received 10mg  Metoprolol orally so far.   19:00. Patient has remained in a well-controlled rate <100 bpm, except for a few brief instances of elevated HR with movement. [facility] re-evaluated her in the ED in which they state she is safe for discharge and f/u tomorrow. She denies any current symptoms and also states she would like to get home and that she can come back if her symptoms return. Patient given return precautions, including worsening of signs or symptoms. Patient instructed to follow-up with [facility] as scheduled tomorrow.    Clemetine Marker, MD 08/25/12 [phone]

## 2012-08-26 ENCOUNTER — Encounter: Payer: Self-pay | Admitting: Physician Assistant

## 2012-08-26 ENCOUNTER — Ambulatory Visit (INDEPENDENT_AMBULATORY_CARE_PROVIDER_SITE_OTHER): Payer: Medicare Other | Admitting: Physician Assistant

## 2012-08-26 VITALS — BP 126/58 | HR 59 | Ht 68.5 in | Wt 155.0 lb

## 2012-08-26 DIAGNOSIS — I4891 Unspecified atrial fibrillation: Secondary | ICD-10-CM | POA: Diagnosis not present

## 2012-08-26 DIAGNOSIS — I10 Essential (primary) hypertension: Secondary | ICD-10-CM

## 2012-08-26 DIAGNOSIS — I480 Paroxysmal atrial fibrillation: Secondary | ICD-10-CM

## 2012-08-26 DIAGNOSIS — I2510 Atherosclerotic heart disease of native coronary artery without angina pectoris: Secondary | ICD-10-CM

## 2012-08-26 NOTE — Assessment & Plan Note (Signed)
Patient was seen in the emergency room for recurrent atrial fibrillation yesterday. Cardizem and Multaq were prescribed. Her heart rate is 59 and sinus rhythm today. I am worried she may get a little slow on the combination of drugs. Asked her to keep a close eye on her heart rate and called since she has any symptoms of dizziness were extreme fatigue, or heart rates below 50. I will see her back in one to 2 weeks and then she will follow up with Dr. Jearld Pies.

## 2012-08-26 NOTE — Assessment & Plan Note (Signed)
Stable

## 2012-08-26 NOTE — Progress Notes (Signed)
HPI:  This is an 79 year old white female patient of Dr. Jearld Pies who spent 12 hours in the emergency room yesterday in atrial fibrillation and finally converted to sinus rhythm and was discharged home. She was given a prescription for Cardizem and Multaq but the pharmacy was closed when she got home and she has not taken them yet. She has had no further palpitations but just feels drained.  Patient has history of remote CABG, PAF since 02/2012 on Eliquis, hypertension, CVA, anxiety. She has been having more frequent episodes of atrial fibrillation weekly and attributes it to increased stress. She has been worried about her risk of stroke with atrial fibrillation as well as the tornado warnings that have been issued for the last couple days. In the emergency room troponin was negative and chest x-ray with some without significant change.   Allergies: -- Hydrocodone    --  REACTION: migraines  -- Ibuprofen -- Other (See Comments)   --  ulcers  -- Oxycodone Hcl    --  REACTION: migraines  -- Prednisone -- Other (See Comments)   --  ulcers  -- Statins -- Other (See Comments)   --  Aches and pains  -- Penicillins -- Swelling and Rash  Current Outpatient Prescriptions on File Prior to Visit: apixaban (ELIQUIS) 5 MG TABS tablet, Take 1 tablet (5 mg total) by mouth 2 (two) times daily., Disp: 60 tablet, Rfl: 6 calcium-vitamin D (OSCAL WITH D) 500-200 MG-UNIT per tablet, Take 1 tablet by mouth daily., Disp: , Rfl:  colesevelam (WELCHOL) 625 MG tablet, Take 3,750 mg by mouth daily. , Disp: , Rfl:  diltiazem (CARDIZEM CD) 180 MG 24 hr capsule, Take 1 capsule (180 mg total) by mouth daily., Disp: 30 capsule, Rfl: 2 dronedarone (MULTAQ) 400 MG tablet, Take 1 tablet (400 mg total) by mouth 2 (two) times daily with a meal., Disp: 60 tablet, Rfl: 2 ferrous sulfate 325 (65 FE) MG tablet, Take 325 mg by mouth daily with breakfast., Disp: , Rfl:  Flaxseed, Linseed, (FLAXSEED OIL) 1000 MG CAPS, Take 1,000 mg by  mouth at bedtime. , Disp: , Rfl:  LORazepam (ATIVAN) 0.5 MG tablet, Take 0.5 mg by mouth every 8 (eight) hours as needed for anxiety., Disp: , Rfl:  metoprolol succinate (TOPROL-XL) 50 MG 24 hr tablet, Take 50 mg by mouth daily. Take with or immediately following a meal., Disp: , Rfl:  Multiple Vitamin (MULTIVITAMIN) tablet, Take 1 tablet by mouth daily.  , Disp: , Rfl:  niacin 500 MG CR capsule, Take 500 mg by mouth daily after supper. , Disp: , Rfl:  Omega-3 Fatty Acids (FISH OIL) 1000 MG CAPS, Take 1,000 mg by mouth 2 (two) times daily. , Disp: , Rfl:  pantoprazole (PROTONIX) 40 MG tablet, Take 40 mg by mouth every evening. , Disp: , Rfl:  vitamin C (ASCORBIC ACID) 500 MG tablet, Take 500 mg by mouth daily.  , Disp: , Rfl:  [DISCONTINUED] sertraline (ZOLOFT) 25 MG tablet, Take 1 tablet (25 mg total) by mouth daily., Disp: 30 tablet, Rfl: 2 [DISCONTINUED] zolpidem (AMBIEN) 5 MG tablet, Take 1 tablet (5 mg total) by mouth at bedtime as needed for sleep., Disp: 30 tablet, Rfl: 0  No current facility-administered medications on file prior to visit.   Past Medical History:   BACK PAIN, LUMBAR                               09/27/2009  CORONARY ARTERY DISEASE                                        Comment:a. s/p remote CABG 1997, 3V.   DIVERTICULITIS, HX OF                           11/25/2006    FATIGUE                                         07/21/2008    GERD                                            11/25/2006    HYPERLIPIDEMIA                                               HYPERTENSION                                                 IBS                                             12/07/2008    MIGRAINE WITH AURA                              08/13/2007    Muscle weakness (generalized)                   09/27/2009     OSTEOARTHRITIS                                  08/13/2007    TRANSIENT ISCHEMIC ATTACK                       04/13/2007   Hiatal hernia                                                 Paroxysmal atrial fibrillation                  Dx 02/2012   Anxiety                                                      CVA (cerebral infarction)  Comment:a. Thalamic infarct 09/2009.   Anemia                                                       GI bleed                                                       Comment:a. 09/2009: secondary to antral ulcer.   Mild carotid artery disease                                    Comment:a. 0-39% bilat 05/2011 (f/u recommended 05/2013).  Past Surgical History:   CORONARY ARTERY BYPASS GRAFT                                  ABDOMINAL HYSTERECTOMY                                        TONSILLECTOMY                                                Review of patient's family history indicates:   Heart attack                   Mother                   Heart failure                  Father                   Social History   Marital Status: Married             Spouse Name:                      Years of Education:                 Number of children:             Occupational History   None on file  Social History Main Topics   Smoking Status: Never Smoker                     Smokeless Status: Never Used                       Alcohol Use: No             Drug Use: No             Sexual Activity: No                 Other Topics            Concern   None on file  Social History Narrative  None on file    ROS:see history of present illness otherwise negative   PHYSICAL EXAM: Well-nournished, in no acute distress. Neck: No JVD, HJR, Bruit, or thyroid enlargement  Lungs: No tachypnea, clear without wheezing, rales, or rhonchi  Cardiovascular: RRR, PMI not displaced, 2/6 systolic murmur at left sternal border, no gallops, bruit, thrill, or heave.  Abdomen: BS normal. Soft without organomegaly, masses, lesions or tenderness.  Extremities: without cyanosis, clubbing or edema. Good distal pulses  bilateral  SKin: Warm, no lesions or rashes   Musculoskeletal: No deformities  Neuro: no focal signs  BP 126/58  Pulse 59  Ht 5' 8.5" (1.74 m)  Wt 155 lb (70.308 kg)  BMI 23.22 kg/m2   ZOX:WRUEA bradycardia at 59 beats per minute with PAC

## 2012-08-26 NOTE — Patient Instructions (Signed)
Your physician recommends that you schedule a follow-up appointment in: 2 weeks with Marissa Bowen

## 2012-08-27 NOTE — ED Provider Notes (Signed)
I have personally seen and examined the patient.  I have discussed the plan of care with the resident.  I have reviewed the documentation on PMH/FH/Soc. History.  I have reviewed the documentation of the resident and agree.  Doug Sou, MD 08/27/12 [phone]

## 2012-08-28 ENCOUNTER — Other Ambulatory Visit: Payer: Self-pay | Admitting: Internal Medicine

## 2012-08-29 ENCOUNTER — Telehealth: Payer: Self-pay | Admitting: Cardiology

## 2012-08-29 NOTE — Telephone Encounter (Signed)
Marissa Bowen has h/o PAF and was recently evaluated in the [facility] ED by Dr. Shirlee Latch. She spontaneously converted to SR while in the ED. She was started on diltiazem and Multaq, continued on Eliquis and discharged home to follow-up as an outpatient. She calls today to report a "deep, dry cough" but has no other complaints. She is very anxious and worried that her cough indicates she has HF. Her last echo was done Dec 2013 which showed normal LVEF, 60-65%, and mild to moderate diastolic dysfunction. She denies LE or abdominal swelling. She denies DOE, orthopnea or PND. She requests a follow-up appointment with Dr. Shirlee Latch. I scheduled her to see Dr. Shirlee Latch on Tuesday, 09/01/2012 at 12:15 PM. I instructed her to call us back should she develop symptoms of LE swelling, DOE, orthopnea or PND. She also knows to call us if her palpitations return. She expressed verbal understanding and agrees with this plan of care.

## 2012-09-01 ENCOUNTER — Ambulatory Visit (INDEPENDENT_AMBULATORY_CARE_PROVIDER_SITE_OTHER): Payer: Medicare Other | Admitting: Cardiology

## 2012-09-01 ENCOUNTER — Encounter: Payer: Self-pay | Admitting: Cardiology

## 2012-09-01 VITALS — BP 128/56 | HR 49 | Ht 68.5 in | Wt 158.0 lb

## 2012-09-01 DIAGNOSIS — I2510 Atherosclerotic heart disease of native coronary artery without angina pectoris: Secondary | ICD-10-CM

## 2012-09-01 DIAGNOSIS — I4891 Unspecified atrial fibrillation: Secondary | ICD-10-CM

## 2012-09-01 NOTE — Patient Instructions (Addendum)
Stop diltiazem.    You have  a follow-up appointment scheduled with Dr Shirlee Latch on Friday July 25,2014 at 8:45 AM.

## 2012-09-02 DIAGNOSIS — I4891 Unspecified atrial fibrillation: Secondary | ICD-10-CM | POA: Insufficient documentation

## 2012-09-02 NOTE — Progress Notes (Signed)
Patient ID: Marissa Bowen, female   DOB: 12/19/1926, 79 y.o.   MRN: [id number] PCP: Dr. Amador Cunas  79 yo with history of CAD s/p CABG in 1997 and paroxysmal atrial fibrillation presents for cardiology followup. She has been seen by Dr. Riley Kill in the past and is seen by me for the first time today.  She was in the ER recently with recurrent atrial fibrillation.  She converted back to NSR in the ER.  Atrial fibrillation was actually first noted back in 11/13.  I started her on diltiazem CD and dronedarone. Since that time, she seems to have held NSR.  She has had no tachypalpitations.  HR is in the 40s in the office today.  It has been in the 50s when she checks at home.  She denies chest pain, exertional dyspnea, orthopnea, or lightheadedness.     Labs (4/14): K 4, creatinine 0.61  PMH: 1. CAD: s/p CABG 1997.  Echo (12/13) with EF 60-65%, no significant valvular abnormalities, normal RV size and systolic function.  2. Paroxysmal atrial fibrillation noted since 11/13.  She, of note, has a history of TIA in 2008.   3. HTN 4. Hyperlipidemia: Unable to tolerate statins due to myalgias. 5. Migraines 6. GERD 7. IBS 8. Low back pain  SH: Married, lives in [place], nonsmoker.   FH: Father with MI  ROS: All systems reviewed and negative except as per HPI.   Current Outpatient Prescriptions  Medication Sig Dispense Refill  . apixaban (ELIQUIS) 5 MG TABS tablet Take 1 tablet (5 mg total) by mouth 2 (two) times daily.  60 tablet  6  . calcium-vitamin D (OSCAL WITH D) 500-200 MG-UNIT per tablet Take 1 tablet by mouth daily.      . colesevelam (WELCHOL) 625 MG tablet Take 3,750 mg by mouth daily.       Marland Kitchen dronedarone (MULTAQ) 400 MG tablet Take 1 tablet (400 mg total) by mouth 2 (two) times daily with a meal.  60 tablet  2  . ferrous sulfate 325 (65 FE) MG tablet Take 325 mg by mouth daily with breakfast.      . Flaxseed, Linseed, (FLAXSEED OIL) 1000 MG CAPS Take 1,000 mg by mouth at bedtime.        Marland Kitchen LORazepam (ATIVAN) 0.5 MG tablet Take 0.5 mg by mouth every 8 (eight) hours as needed for anxiety.      . metoprolol succinate (TOPROL-XL) 50 MG 24 hr tablet Take 50 mg by mouth daily. Take with or immediately following a meal.      . Multiple Vitamin (MULTIVITAMIN) tablet Take 1 tablet by mouth daily.        . niacin 500 MG CR capsule Take 500 mg by mouth daily after supper.       . Omega-3 Fatty Acids (FISH OIL) 1000 MG CAPS Take 1,000 mg by mouth 2 (two) times daily.       . pantoprazole (PROTONIX) 40 MG tablet Take 40 mg by mouth every evening.       . vitamin C (ASCORBIC ACID) 500 MG tablet Take 500 mg by mouth daily.        . [DISCONTINUED] sertraline (ZOLOFT) 25 MG tablet Take 1 tablet (25 mg total) by mouth daily.  30 tablet  2  . [DISCONTINUED] zolpidem (AMBIEN) 5 MG tablet Take 1 tablet (5 mg total) by mouth at bedtime as needed for sleep.  30 tablet  0   No current facility-administered medications for this visit.  BP 128/56  Pulse 49  Ht 5' 8.5" (1.74 m)  Wt 158 lb (71.668 kg)  BMI 23.67 kg/m2  SpO2 97% General: NAD Neck: No JVD, no thyromegaly or thyroid nodule.  Lungs: Clear to auscultation bilaterally with normal respiratory effort. CV: Nondisplaced PMI.  Heart regular S1/S2, no S3/S4, no murmur.  No peripheral edema.  No carotid bruit.  Normal pedal pulses.  Abdomen: Soft, nontender, no hepatosplenomegaly, no distention.  Neurologic: Alert and oriented x 3.  Psych: Normal affect. Extremities: No clubbing or cyanosis.   Assessment/Plan: 1. Atrial fibrillation: Paroxysmal.  She is very symptomatic when she goes into atrial fibrillation.  She is now holding NSR on dronedarone.  HR has been on the low side.  - I will have her stop diltiazem CD and continue Toprol XL.  - Continue dronedarone.  She knows to take it with food.  - Continue Eliquis at 5 mg bid.  2. CAD: Prior CABG.  No ischemic symptoms.  She cannot tolerate statins.  She is not on ASA because she is on  Eliquis.  Continue Toprol XL.  3. Hyperlipidemia: On Welchol because she does not tolerate statins.   [name] 09/02/2012

## 2012-09-07 ENCOUNTER — Other Ambulatory Visit: Payer: Self-pay | Admitting: Internal Medicine

## 2012-09-08 ENCOUNTER — Telehealth: Payer: Self-pay | Admitting: Cardiology

## 2012-09-08 MED ORDER — AMLODIPINE BESYLATE 5 MG PO TABS: 5.0000 mg | ORAL_TABLET | Freq: Every day | ORAL | Status: DC

## 2012-09-08 NOTE — Telephone Encounter (Signed)
Patient called because she York Spaniel she was taken off Cardizem CD 180 mg by Dr Shirlee Latch on her last office visit on 09/01/12, because her heart rate was running low; Pt's HR continues to be low, and BP running a little high. Last night BP was 149/65 about two hours after taken her Toprol XL 50 mg, heart rate 55 beats per minute. This morning BP is 159/73, pulse 45 beats per minute. Pt wants for Dr. Shirlee Latch to know what is going on.

## 2012-09-08 NOTE — Telephone Encounter (Signed)
New problem   Pt needs to speak to someone regarding her meds and b/p

## 2012-09-08 NOTE — Telephone Encounter (Signed)
She can decrease Toprol XL to 25 mg daily and start amlodipine 5 mg daily.  Call us in a couple of weeks with BP readings.

## 2012-09-08 NOTE — Telephone Encounter (Signed)
Pt aware of instructions and orders.  Rx will be sent electronically

## 2012-09-09 ENCOUNTER — Ambulatory Visit: Payer: Medicare Other | Admitting: Internal Medicine

## 2012-09-14 ENCOUNTER — Telehealth: Payer: Self-pay | Admitting: Cardiology

## 2012-09-14 ENCOUNTER — Ambulatory Visit: Payer: Medicare Other | Admitting: Physician Assistant

## 2012-09-14 MED ORDER — AMLODIPINE BESYLATE 10 MG PO TABS: 10.0000 mg | ORAL_TABLET | Freq: Every day | ORAL | Status: DC

## 2012-09-14 MED ORDER — METOPROLOL SUCCINATE ER 25 MG PO TB24: ORAL_TABLET | ORAL | Status: DC

## 2012-09-14 NOTE — Telephone Encounter (Signed)
Low HR.  Decrease Toprol XL to 12.5 mg daily and increase amlodipine to 10 mg daily.  Call in 2 wks for BPs and HRs.

## 2012-09-14 NOTE — Telephone Encounter (Signed)
New Problem:    Patient called in wanting to give you a report about her BP.  Please call back.

## 2012-09-14 NOTE — Telephone Encounter (Signed)
Pt advised,verbalized understanding. 

## 2012-09-14 NOTE — Telephone Encounter (Signed)
Pt was advised 09/08/12 to decrease Toprol XL to 25mg  daily and start Norvasc 5mg  daily. Pt denies any lightheadedness but is concerned about heart rate. I will forward to Dr Shirlee Latch for review.

## 2012-09-14 NOTE — Telephone Encounter (Signed)
Spoke with patient. BP readings 09/08/12 150/55 46 146/66 51 09/09/12 155/69 45 141/64 53 09/10/12 158/70 45 141/62 51 155/73 47 148/70 48 150/76 52 09/12/12 153/66 45 142/64 51 141/63 56 137/60 55 09/13/12 134/65 55 09/14/12 150/77 48.

## 2012-09-22 ENCOUNTER — Encounter: Payer: Self-pay | Admitting: Cardiology

## 2012-09-24 ENCOUNTER — Telehealth: Payer: Self-pay | Admitting: Cardiology

## 2012-09-24 ENCOUNTER — Telehealth: Payer: Self-pay | Admitting: Internal Medicine

## 2012-09-24 NOTE — Telephone Encounter (Signed)
Spoke with patient. Pt states is BP and heart rate are better. Pt states BP this AM was 131/62 and heart rate is better--lowest heart rate 48 and highest heart rate 71.

## 2012-09-24 NOTE — Telephone Encounter (Signed)
Spoke to pt told her I do have samples of Welchol 625 mg tablets and a coupon for her. Will put at the front desk for pick up. Pt verbalized understanding.

## 2012-09-24 NOTE — Telephone Encounter (Signed)
New Problem  Pt wants to speak with you regarding her medications.

## 2012-09-24 NOTE — Telephone Encounter (Signed)
PT is requesting samples of WELCHOL 625 MG tablet [[id number]. She states that she is in her "donut hole" and it would cost her hundreds of dollars to have it refilled. Please assist.

## 2012-09-28 ENCOUNTER — Telehealth: Payer: Self-pay | Admitting: Cardiology

## 2012-09-28 NOTE — Telephone Encounter (Signed)
New problem   Pt wants to talk to you regarding her b/p record

## 2012-09-28 NOTE — Telephone Encounter (Signed)
Spoke with pt about BP and heart rate. Pt states BP since 5/29 has been in the 130s/50-60s and heart rate has been in the high 50s and 60s, highest heart rate 67. Pt does note some mild edema in her feet.  Pt states her feet  are normal in the morning and get worse as the day progresses. She denies any SOB or puffiness elsewhere. It is something that she feels she can tolerate. Amlodipine was increased recently  to 10mg  daily. I will forward to Dr Shirlee Latch for review.

## 2012-09-28 NOTE — Telephone Encounter (Signed)
Pt.notified

## 2012-09-28 NOTE — Telephone Encounter (Signed)
Good no changes

## 2012-10-02 DIAGNOSIS — N8182 Incompetence or weakening of pubocervical tissue: Secondary | ICD-10-CM | POA: Diagnosis not present

## 2012-10-12 ENCOUNTER — Telehealth: Payer: Self-pay | Admitting: Cardiology

## 2012-10-12 NOTE — Telephone Encounter (Signed)
Spoke with patient. Pt states  swelling in her ankles has not improved. She can get her shoes on and her ankles are normal in the morning but by late afternoon are swollen. She denies SOB or other symptoms. We discussed avoiding sodium and keeping feet elevated during the day. She states her BP is under good control. I will forward to Dr Shirlee Latch for review.

## 2012-10-12 NOTE — Telephone Encounter (Signed)
Pt.notified

## 2012-10-12 NOTE — Telephone Encounter (Signed)
New Problem:    Patient called in wanting to speak with you about her ankle swelling.  Please call back.

## 2012-10-12 NOTE — Telephone Encounter (Signed)
Don't think I would change anything if the swelling goes away overnight.

## 2012-10-26 ENCOUNTER — Telehealth: Payer: Self-pay | Admitting: Cardiology

## 2012-10-26 DIAGNOSIS — I10 Essential (primary) hypertension: Secondary | ICD-10-CM

## 2012-10-26 NOTE — Telephone Encounter (Signed)
LMTCB

## 2012-10-26 NOTE — Telephone Encounter (Signed)
New Problem:    Patient called in wanting to speak with you about her swollen ankles.  Please call back.

## 2012-10-26 NOTE — Telephone Encounter (Signed)
Spoke with patient. Pt still concerned about the swelling in her ankles. Her ankles are normal in the morning but are swollen by late afternoon. Pt also states she has a blotchy red  rash on the front of both legs below her knees for about 2 weeks. She is asking if this might be from amlodipine. Pt's BP today 145/61 and pulse 64. Pt has otherwise felt OK so this is first time she has checked her BP in a while. I will forward to Dr Shirlee Latch for review.

## 2012-10-26 NOTE — Telephone Encounter (Signed)
Probably from amlodipine.  I do not think it is dangerous.  If it is bothering her a lot, she can stop amlodipine and start lisinopril 10 mg daily with call for BP readings and BMET in 10 days.  If she can tolerate the swelling, she does not have to change anything.

## 2012-10-27 MED ORDER — LISINOPRIL 10 MG PO TABS: 10.0000 mg | ORAL_TABLET | Freq: Every day | ORAL | Status: DC

## 2012-10-27 NOTE — Telephone Encounter (Signed)
Returned call to patient Dr.McLean advised to stop amlodipine.Start Lisinopril 10 mg daily.Monitor B/P bring a dairy of B/P readings to office in 10 days and also have bmet in 10 days.Advised to call sooner if needed.

## 2012-10-27 NOTE — Telephone Encounter (Signed)
Follow-up:    Patient called in returning Anne's call.  Please call back.

## 2012-11-02 ENCOUNTER — Telehealth: Payer: Self-pay | Admitting: Internal Medicine

## 2012-11-02 ENCOUNTER — Other Ambulatory Visit: Payer: Self-pay | Admitting: Internal Medicine

## 2012-11-02 MED ORDER — LORAZEPAM 0.5 MG PO TABS: ORAL_TABLET | ORAL | Status: DC

## 2012-11-02 NOTE — Telephone Encounter (Signed)
Marissa Bowen/patient Phone: [phone] Called to request expedited refill for Lorazepam 0.5 mg #60; sig 1 po every 8 hours prn anxiety or sleep.   Last filled 10/02/12.  Last office visit 08/18/12.  Noted was running low 10/29/12 but due to holiday weekend was unable to request refill.  Pharmacy contacting office 11/02/12.  Ran out of medication 11/01/12.  [facility] [phone].  Please follow up and call patient.

## 2012-11-02 NOTE — Telephone Encounter (Signed)
Pt notified Rx refill called into pharmacy. 

## 2012-11-06 ENCOUNTER — Encounter: Payer: Self-pay | Admitting: *Deleted

## 2012-11-06 ENCOUNTER — Other Ambulatory Visit (INDEPENDENT_AMBULATORY_CARE_PROVIDER_SITE_OTHER): Payer: Medicare Other

## 2012-11-06 DIAGNOSIS — I10 Essential (primary) hypertension: Secondary | ICD-10-CM | POA: Diagnosis not present

## 2012-11-06 LAB — BASIC METABOLIC PANEL
BUN: 12 mg/dL (ref 6–23)
Calcium: 9.1 mg/dL (ref 8.4–10.5)
Creatinine, Ser: 0.8 mg/dL (ref 0.4–1.2)
GFR: 70.59 mL/min (ref >60.00)

## 2012-11-06 NOTE — Patient Instructions (Addendum)
Patient brought BP Readings in office today for Thurston Hole and Dr Shirlee Latch. Will route to inbox and put hardcopy in Anne's Box.  Kena Limon  Date Time  BP Pulse 7/1  9:00am 120/58  58  5:30pm 137/67  61   Starting Lisinopril 7/2 8:00am 116/69  54    5:30pm 121/63  55 7/3 9:00am 119/48  55 7/4 8:00am 148/64  49  6:15pm 127/51  55 7/5 9:35am 129/56  55  4:45pm 129/65  54 7/6 3:50pm 121/61  55 7/7 11:00am 119/61  54 7/8  8:35am 122/46  50  4:25pm 140/62  50 7/9 11:10am 141/56  53  5:50pm  128/67  54 7/10   132/61  51 7/11 11:00am 142/68  60

## 2012-11-10 ENCOUNTER — Telehealth: Payer: Self-pay | Admitting: Cardiology

## 2012-11-10 NOTE — Telephone Encounter (Signed)
New problem  Pt states that the prescription for Multaq 400 MG is costing her $185.06 for her because she is in the doughnut hole of her prescription insurance.

## 2012-11-10 NOTE — Telephone Encounter (Signed)
She is not a candidate for class Ic agents like flecainide.  Given her age and history of CAD, I think that amiodarone probably would be the best choice to maintain NSR.  Stop dronedarone and start amiodarone 200 mg bid x 1 week then 200 mg daily.  She will need LFTs and TSH after 2 wks and she will need baseline PFTs.

## 2012-11-10 NOTE — Telephone Encounter (Signed)
Spoke with patient. Pt states she is in the doughnut hole and is going to have to pay out of pocket for Multaq for the rest of the year. The cost is $185.06 for a 30 day supply.   Pt is asking if there are any alternatives instead of Multaq. She states she is not going to be able to continue to pay for it. I will forward to Dr Shirlee Latch for review.

## 2012-11-11 NOTE — Telephone Encounter (Signed)
Discussed with patient. Pt will call back when she makes a decision about changing over to amiodarone.

## 2012-11-13 DIAGNOSIS — N8182 Incompetence or weakening of pubocervical tissue: Secondary | ICD-10-CM | POA: Diagnosis not present

## 2012-11-16 DIAGNOSIS — Z1231 Encounter for screening mammogram for malignant neoplasm of breast: Secondary | ICD-10-CM | POA: Diagnosis not present

## 2012-11-20 ENCOUNTER — Ambulatory Visit: Payer: Medicare Other | Admitting: Cardiology

## 2012-11-23 DIAGNOSIS — T83711A Erosion of implanted vaginal mesh and other prosthetic materials to surrounding organ or tissue, initial encounter: Secondary | ICD-10-CM | POA: Diagnosis not present

## 2012-11-24 ENCOUNTER — Telehealth: Payer: Self-pay | Admitting: Cardiology

## 2012-11-24 ENCOUNTER — Telehealth: Payer: Self-pay | Admitting: Internal Medicine

## 2012-11-24 MED ORDER — COLESEVELAM HCL 625 MG PO TABS: 3750.0000 mg | ORAL_TABLET | Freq: Every day | ORAL | Status: DC

## 2012-11-24 NOTE — Telephone Encounter (Signed)
New Prob      Pt requesting samples of ELIQUIS.

## 2012-11-24 NOTE — Telephone Encounter (Signed)
Spoke to pt told her we do not have samples of Welchol it is generic now, so I can send a Rx to the pharmacy. Pt verbalized understanding. Rx sent

## 2012-11-24 NOTE — Telephone Encounter (Signed)
Called patient and advised that we do not have any samples of Eliquis but she can pick up a discount card. She is aware to pick this up at the front desk.

## 2012-11-24 NOTE — Telephone Encounter (Signed)
Pt is calling to request samples of colesevelam (WELCHOL) 625 MG tablet. Please assist.

## 2012-11-30 ENCOUNTER — Telehealth: Payer: Self-pay | Admitting: Cardiology

## 2012-11-30 NOTE — Telephone Encounter (Signed)
Walk In pt Form " Pt Dropped off Sealed Envelope" will give to Anne/McLean 11/30/12/KM

## 2012-12-02 ENCOUNTER — Other Ambulatory Visit: Payer: Self-pay

## 2012-12-09 ENCOUNTER — Other Ambulatory Visit: Payer: Self-pay | Admitting: Obstetrics and Gynecology

## 2012-12-09 DIAGNOSIS — D281 Benign neoplasm of vagina: Secondary | ICD-10-CM | POA: Diagnosis not present

## 2012-12-09 DIAGNOSIS — N760 Acute vaginitis: Secondary | ICD-10-CM | POA: Diagnosis not present

## 2012-12-09 DIAGNOSIS — T83711A Erosion of implanted vaginal mesh and other prosthetic materials to surrounding organ or tissue, initial encounter: Secondary | ICD-10-CM | POA: Diagnosis not present

## 2012-12-11 ENCOUNTER — Other Ambulatory Visit: Payer: Self-pay | Admitting: *Deleted

## 2012-12-14 ENCOUNTER — Telehealth: Payer: Self-pay | Admitting: Cardiology

## 2012-12-14 DIAGNOSIS — I4891 Unspecified atrial fibrillation: Secondary | ICD-10-CM

## 2012-12-14 MED ORDER — AMIODARONE HCL 200 MG PO TABS: ORAL_TABLET | ORAL | Status: DC

## 2012-12-14 NOTE — Telephone Encounter (Signed)
See phone note 11/10/12. Pt to stop Multaq.  Pt to start amiodarone 200mg  bid for 1 week, then amiodarone 200mg  daily in the place of Multaq. Pt aware she needs baseline PFTs and liver profile and TSH in 2 weeks. Pt has appt with Dr Shirlee Latch 12/16/12, I will place order for liver profile/TSH to be done at that time.

## 2012-12-14 NOTE — Telephone Encounter (Signed)
Pt was to get a call re med to replace  Multaq

## 2012-12-14 NOTE — Telephone Encounter (Signed)
Will schedule TSH/liver profile for 12/29/12 since appt with Dr Shirlee Latch is 12/16/12(2 days from now). Pt adised.

## 2012-12-16 ENCOUNTER — Encounter: Payer: Self-pay | Admitting: Cardiology

## 2012-12-16 ENCOUNTER — Ambulatory Visit (INDEPENDENT_AMBULATORY_CARE_PROVIDER_SITE_OTHER): Payer: Medicare Other | Admitting: Cardiology

## 2012-12-16 VITALS — BP 128/60 | HR 58 | Ht 68.5 in | Wt 158.0 lb

## 2012-12-16 DIAGNOSIS — I4891 Unspecified atrial fibrillation: Secondary | ICD-10-CM | POA: Diagnosis not present

## 2012-12-16 DIAGNOSIS — E785 Hyperlipidemia, unspecified: Secondary | ICD-10-CM

## 2012-12-16 DIAGNOSIS — I2510 Atherosclerotic heart disease of native coronary artery without angina pectoris: Secondary | ICD-10-CM | POA: Diagnosis not present

## 2012-12-16 LAB — LIPID PANEL
Cholesterol: 174 mg/dL (ref 0–200)
LDL Cholesterol: 98 mg/dL (ref 0–99)
Triglycerides: 142 mg/dL (ref 0.0–149.0)
VLDL: 28.4 mg/dL (ref 0.0–40.0)

## 2012-12-16 LAB — HEPATIC FUNCTION PANEL
ALT: 16 U/L (ref 0–35)
Albumin: 3.6 g/dL (ref 3.5–5.2)
Bilirubin, Direct: 0 mg/dL (ref 0.0–0.3)
Total Protein: 6.7 g/dL (ref 6.0–8.3)

## 2012-12-16 NOTE — Patient Instructions (Addendum)
Your physician recommends that you have lab work today--lipid profile/liver profile/TSH today.  Take amiodarone 200mg  twice a day for 1 week then take amiodarone 200mg  daily.  Keep the appointment for the PFT scheduled 12/23/12 at 9 AM.   Your physician recommends that you schedule a follow-up appointment in: 3 months with Dr Shirlee Latch.

## 2012-12-17 NOTE — Progress Notes (Signed)
Patient ID: Marissa Bowen, female   DOB: [birth year], 79 y.o.   MRN: [id number] PCP: Dr. Amador Cunas  79 yo with history of CAD s/p CABG in 1997 and paroxysmal atrial fibrillation presents for cardiology followup.  She was in the ER earlier this year with recurrent atrial fibrillation.  She converted back to NSR in the ER.  Atrial fibrillation was actually first noted back in 11/13. I started her on apixaban and dronedarone. Since that time, she seems to have held NSR.  She has had no tachypalpitations.  Recently, she fell into the doughnut hole and had to stop dronedarone due to cost.  I therefore switched her over the amiodarone.  She was on amlodipine for HTN but developed ankle swelling so is now on lisinopril instead.  She denies chest pain, exertional dyspnea, orthopnea, or lightheadedness.     Labs (4/14): K 4, creatinine 0.61 Labs (7/14): K 4.8, creatinine 0.8  PMH: 1. CAD: s/p CABG 1997.  Echo (12/13) with EF 60-65%, no significant valvular abnormalities, normal RV size and systolic function.  2. Paroxysmal atrial fibrillation noted since 11/13.  Very symptomatic.  She, of note, has a history of TIA in 2008.  Was on dronedarone but stopped due to doughnut hole and now on amiodarone.  3. HTN: edema with amlodipine.  4. Hyperlipidemia: Unable to tolerate statins due to myalgias. 5. Migraines 6. GERD 7. IBS 8. Low back pain  SH: Married, lives in [place], nonsmoker.   FH: Father with MI  ROS: All systems reviewed and negative except as per HPI.   Current Outpatient Prescriptions  Medication Sig Dispense Refill  . amiodarone (PACERONE) 200 MG tablet 1 tablet two times a day for one week, then 1 tablet daily  40 tablet  3  . apixaban (ELIQUIS) 5 MG TABS tablet Take 1 tablet (5 mg total) by mouth 2 (two) times daily.  60 tablet  6  . calcium-vitamin D (OSCAL WITH D) 500-200 MG-UNIT per tablet Take 1 tablet by mouth daily.      . colesevelam (WELCHOL) 625 MG tablet Take 6 tablets  (3,750 mg total) by mouth daily.  180 tablet  2  . Flaxseed, Linseed, (FLAXSEED OIL) 1000 MG CAPS Take 1,000 mg by mouth at bedtime.       Marland Kitchen lisinopril (PRINIVIL,ZESTRIL) 10 MG tablet Take 1 tablet (10 mg total) by mouth daily.  30 tablet  6  . LORazepam (ATIVAN) 0.5 MG tablet TAKE ONE TABLET EVERY EIGHT HOURS AS NEEDED FOR ANXIETY OR SLEEP  90 tablet  1  . metoprolol succinate (TOPROL XL) 25 MG 24 hr tablet 1/2 tablet (total 12.5 mg) daily  15 tablet  6  . Multiple Vitamin (MULTIVITAMIN) tablet Take 1 tablet by mouth daily.        . niacin 500 MG CR capsule Take 500 mg by mouth daily after supper.       . Omega-3 Fatty Acids (FISH OIL) 1000 MG CAPS Take 1,000 mg by mouth 2 (two) times daily.       . pantoprazole (PROTONIX) 40 MG tablet TAKE ONE TABLET BY MOUTH ONCE DAILY  90 tablet  1  . vitamin C (ASCORBIC ACID) 500 MG tablet Take 500 mg by mouth daily.        . [DISCONTINUED] sertraline (ZOLOFT) 25 MG tablet Take 1 tablet (25 mg total) by mouth daily.  30 tablet  2  . [DISCONTINUED] zolpidem (AMBIEN) 5 MG tablet Take 1 tablet (5 mg total) by mouth at  bedtime as needed for sleep.  30 tablet  0   No current facility-administered medications for this visit.    BP 128/60  Pulse 58  Ht 5' 8.5" (1.74 m)  Wt 71.668 kg (158 lb)  BMI 23.67 kg/m2 General: NAD Neck: No JVD, no thyromegaly or thyroid nodule.  Lungs: Clear to auscultation bilaterally with normal respiratory effort. CV: Nondisplaced PMI.  Heart regular S1/S2, no S3/S4, no murmur.  No peripheral edema.  No carotid bruit.  Normal pedal pulses.  Abdomen: Soft, nontender, no hepatosplenomegaly, no distention.  Neurologic: Alert and oriented x 3.  Psych: Normal affect. Extremities: No clubbing or cyanosis.   Assessment/Plan: 1. Atrial fibrillation: Paroxysmal.  She is very symptomatic when she goes into atrial fibrillation.  She is now holding NSR on amiodarone (had to stop dronedarone due to cost).   - Continue Toprol XL.  -  Continue amiodarone at 200 mg bid for a total of 1 week, then decrease to 200 mg daily.  She will need LFTs/TSH today.  Will get baseline PFTs.  She knows to get a yearly eye exam.   - Continue Eliquis at 5 mg bid.  2. CAD: Prior CABG.  No ischemic symptoms.  She cannot tolerate statins.  She is not on ASA because she is on Eliquis.  Continue Toprol XL.  3. Hyperlipidemia: On Welchol and Niacin because she does not tolerate statins.  Will get lipids today.   [name] 12/17/2012

## 2012-12-18 ENCOUNTER — Telehealth: Payer: Self-pay | Admitting: Nurse Practitioner

## 2012-12-18 NOTE — Telephone Encounter (Signed)
Patient would like to know what sleeping pill Dr. Shirlee Latch recommends and would like Rx sent to pharmacy.  I advised that I will send message to Dr. Shirlee Latch for advice and we will send Rx next week.  Patient verbalized understanding and agreement with plan of care.

## 2012-12-21 NOTE — Telephone Encounter (Signed)
Spoke with patient, she will discuss sleeping medication  with her PCP.

## 2012-12-23 ENCOUNTER — Ambulatory Visit (HOSPITAL_COMMUNITY)
Admission: RE | Admit: 2012-12-23 | Discharge: 2012-12-23 | Disposition: A | Discharge status: Home / Self Care | Payer: Medicare Other | Source: Ambulatory Visit | Attending: Cardiology | Admitting: Cardiology

## 2012-12-23 DIAGNOSIS — I4891 Unspecified atrial fibrillation: Secondary | ICD-10-CM | POA: Insufficient documentation

## 2012-12-23 DIAGNOSIS — Z79899 Other long term (current) drug therapy: Secondary | ICD-10-CM | POA: Diagnosis not present

## 2012-12-23 MED ORDER — ALBUTEROL SULFATE (5 MG/ML) 0.5% IN NEBU
2.5000 mg | INHALATION_SOLUTION | Freq: Once | RESPIRATORY_TRACT | Status: AC
  Administered 2012-12-23: 2.5 mg via RESPIRATORY_TRACT

## 2012-12-24 DIAGNOSIS — N8182 Incompetence or weakening of pubocervical tissue: Secondary | ICD-10-CM | POA: Diagnosis not present

## 2012-12-25 ENCOUNTER — Telehealth: Payer: Self-pay | Admitting: Internal Medicine

## 2012-12-25 MED ORDER — TRAZODONE HCL 50 MG PO TABS: 25.0000 mg | ORAL_TABLET | Freq: Every evening | ORAL | Status: DC | PRN

## 2012-12-25 NOTE — Telephone Encounter (Signed)
Trazodone 25 #60   1 at bedtime as needed for sleep

## 2012-12-25 NOTE — Telephone Encounter (Signed)
Pt notified Rx for Trazodone 25 mg for sleep was called into pharmacy.

## 2012-12-25 NOTE — Telephone Encounter (Signed)
Patient is requesting a prescription for a sleeping pill. She states that her cardiologist - Shirlee Latch - told her she needed one. [facility]. Please advise.  She does NOT want Ambien.

## 2012-12-29 ENCOUNTER — Other Ambulatory Visit (INDEPENDENT_AMBULATORY_CARE_PROVIDER_SITE_OTHER): Payer: Medicare Other

## 2012-12-29 DIAGNOSIS — I4891 Unspecified atrial fibrillation: Secondary | ICD-10-CM

## 2012-12-29 LAB — HEPATIC FUNCTION PANEL
AST: 18 U/L (ref 0–37)
Alkaline Phosphatase: 58 U/L (ref 39–117)
Total Bilirubin: 0.5 mg/dL (ref 0.3–1.2)

## 2013-01-04 ENCOUNTER — Other Ambulatory Visit: Payer: Self-pay | Admitting: Internal Medicine

## 2013-01-04 NOTE — Telephone Encounter (Signed)
Caller: Elektra/Patient; Phone: [phone]; Reason for Call: Pt is requesting to specifically speak with Lupita Leash regarding medications.  Please follow up with pt.

## 2013-01-05 ENCOUNTER — Encounter: Payer: Self-pay | Admitting: Internal Medicine

## 2013-01-05 ENCOUNTER — Telehealth: Payer: Self-pay | Admitting: *Deleted

## 2013-01-05 NOTE — Telephone Encounter (Signed)
Spoke to pt told her she should have a refill left. Pt stated her bottle says no refills. Told her okay will call Rx into pharmacy. Pt also said the Trazadone is not working for sleep is there something else she can take? Told her Dr. Kirtland Bouchard is out of the office and will be back Thurs will get back to her then. Pt verbalized understanding and also stated she saw her Cardiologist and he told her to take Niacin 2 tablets daily due to not being able to get Welchol cause she is in the donut hole till Jan and she wanted to let Dr.K know. Told her I will let him know.

## 2013-01-07 NOTE — Telephone Encounter (Signed)
Suggest patient get herself a trial of melatonin which may be purchased OTC

## 2013-01-07 NOTE — Telephone Encounter (Signed)
Dr. Kirtland Bouchard see message and advise?

## 2013-01-07 NOTE — Telephone Encounter (Signed)
Generic Ambien 5 mg #30 one at bedtime as needed for sleep 

## 2013-01-07 NOTE — Telephone Encounter (Signed)
Pt would like something else for sleep Trazadone is not working and also since pt can not get Welchol since in donut hole her Cardiologist told her to take 2 Niacin instead.

## 2013-01-07 NOTE — Telephone Encounter (Signed)
Spoke to pt told her Dr. Kirtland Bouchard said try Rx Ambien for sleep. Pt stated I can not take Ambien per Dr. Tedra Senegal due to her heart. Told pt okay will let Dr.K know and see what else he can prescribe.

## 2013-01-08 NOTE — Telephone Encounter (Signed)
Spoke to pt told her Dr. Amador Cunas suggest she try Melatonin OTC to try for sleep. Pt verbalized understanding.

## 2013-01-11 DIAGNOSIS — Z961 Presence of intraocular lens: Secondary | ICD-10-CM | POA: Diagnosis not present

## 2013-01-11 DIAGNOSIS — H04129 Dry eye syndrome of unspecified lacrimal gland: Secondary | ICD-10-CM | POA: Diagnosis not present

## 2013-01-12 ENCOUNTER — Encounter (HOSPITAL_COMMUNITY): Payer: Self-pay | Admitting: *Deleted

## 2013-01-12 ENCOUNTER — Emergency Department (INDEPENDENT_AMBULATORY_CARE_PROVIDER_SITE_OTHER)
Admission: EM | Admit: 2013-01-12 | Discharge: 2013-01-12 | Disposition: A | Discharge status: Home / Self Care | Payer: Medicare Other | Source: Home / Self Care | Attending: Emergency Medicine | Admitting: Emergency Medicine

## 2013-01-12 ENCOUNTER — Emergency Department (INDEPENDENT_AMBULATORY_CARE_PROVIDER_SITE_OTHER): Payer: Medicare Other

## 2013-01-12 DIAGNOSIS — R5381 Other malaise: Secondary | ICD-10-CM

## 2013-01-12 DIAGNOSIS — I10 Essential (primary) hypertension: Secondary | ICD-10-CM | POA: Diagnosis not present

## 2013-01-12 LAB — POCT URINALYSIS DIP (DEVICE)
Bilirubin Urine: NEGATIVE
Glucose, UA: NEGATIVE mg/dL
Hgb urine dipstick: NEGATIVE
Ketones, ur: NEGATIVE mg/dL
Leukocytes, UA: NEGATIVE
Nitrite: NEGATIVE

## 2013-01-12 LAB — POCT I-STAT, CHEM 8
BUN: 11 mg/dL (ref 6–23)
Chloride: 101 mEq/L (ref 96–112)
HCT: 38 % (ref 36.0–46.0)
Potassium: 3.8 mEq/L (ref 3.5–5.1)

## 2013-01-12 NOTE — ED Notes (Signed)
Pt  Reports  Symptoms  Of  Weakness        And  Feeling  Tired      X  2  Days       She     Reports  A  History  Of  Atrial  Fibrillation         And  Wants  To  Be  Checked        For  That  Symptoms        She  denys  Any  Chest pain or  Any  Shortness  Of  Breath    She  Is  Sitting  Upright on  Exam table  Speaking in  Complete  sentances  And  Is  In no  Acute  Distress  Her  Skin is  Warm  And  Dry

## 2013-01-12 NOTE — ED Provider Notes (Signed)
Chief Complaint:   Chief Complaint  Patient presents with  . Weakness    History of Present Illness:   Marissa Bowen is an 79 year old female who has had a four-day history of generalized weakness, fatigue, and tiredness. This comes and goes. She denies any localized weakness or shortness of breath. She's had no chest pain, tightness, or pressure. She has a history of a true fibrillation that comes and goes. She had been seeing Dr. Riley Kill for this but recently has been transferred to Dr. [name]. She denies any fever although she does have hot spells. She's had no headaches, nasal congestion, rhinorrhea, sore throat, or lymphadenopathy. She denies any wheezing or shortness of breath. She does have occasional dry cough. She's had no palpitations, dizziness, or syncope. She denies any abdominal pain, anorexia, weight loss. She does have occasional nausea. She denies any abdominal pain or diarrhea. She's had no urinary or GYN complaints. No extremity swelling, paresthesias, or neurological symptoms.  Review of Systems:  Other than noted above, the patient denies any of the following symptoms. Systemic:  No fever, chills, sweats, myalgias, headache, or anorexia. Eye:  No redness, pain or drainage. ENT:  No earache, nasal congestion, rhinorrhea, sinus pressure, or sore throat. Lungs:  No cough, sputum production, wheezing, shortness of breath. No loud snoring, choking or gasping at night, or unrefreshing sleep. Cardiovascular:  No chest pain, palpitations, or syncope. GI:  No nausea, vomiting, abdominal pain or diarrhea. GU:  No dysuria, frequency, or hematuria. Skin:  No rash or pruritis. Psych:  No history of depression or anxiety.  PMFSH:  Past medical history, family history, social history, meds, and allergies were reviewed. No past history of sleep apnea, depression or anxiety.  She is allergic to hydrocodone, ibuprofen, oxycodone, prednisone, statins, and penicillin. Her current meds  include Eliquis, lisinopril, lorazepam, niacin, Protonix, and she was just started on amiodarone. She has a history of fibrillation, elevated cholesterol, and gastroesophageal reflux.  Physical Exam:   Vital signs:  BP 176/76  Pulse 60  Temp(Src) 98.2 F (36.8 C) (Oral)  Resp 16  SpO2 98% Filed Vitals:   01/12/13 1518 01/12/13 1610 Supine  01/12/13 1612 Sitting  01/12/13 1614 Standing   BP: 176/55 190/61 180/64 176/76  Pulse: 60 54 56 60  Temp: 98.2 F (36.8 C)     TempSrc: Oral     Resp: 16     SpO2: 98%     General:  Alert, in no distress. Eye:  PERRL, full EOMs.  Lids and conjunctivas were normal. ENT:  TMs and canals were normal, without erythema or inflammation.  Nasal mucosa was clear and uncongested, without drainage.  Mucous membranes were moist.  Pharynx was clear, without exudate or drainage.  There were no oral ulcerations or lesions. Neck:  Supple, no adenopathy, tenderness or mass. Thyroid was normal. Lungs:  No respiratory distress.  Lungs were clear to auscultation, without wheezes, rales or rhonchi.  Breath sounds were clear and equal bilaterally. Heart:  Regular rhythm, without gallops, murmers or rubs. Abdomen:  Soft, flat, and non-tender to palpation.  No hepatosplenomagaly or mass. Skin:  Clear, warm, and dry, without rash or lesions. Psych:  Normal mood and affect.  Labs:   Results for orders placed during the hospital encounter of 01/12/13  POCT URINALYSIS DIP (DEVICE)      Result Value Range   Glucose, UA NEGATIVE  NEGATIVE mg/dL   Bilirubin Urine NEGATIVE  NEGATIVE   Ketones, ur NEGATIVE  NEGATIVE  mg/dL   Specific Gravity, Urine 1.010  1.005 - 1.030   Hgb urine dipstick NEGATIVE  NEGATIVE   pH 5.5  5.0 - 8.0   Protein, ur NEGATIVE  NEGATIVE mg/dL   Urobilinogen, UA 0.2  0.0 - 1.0 mg/dL   Nitrite NEGATIVE  NEGATIVE   Leukocytes, UA NEGATIVE  NEGATIVE  POCT I-STAT, CHEM 8      Result Value Range   Sodium 140  135 - 145 mEq/L   Potassium 3.8  3.5 -  5.1 mEq/L   Chloride 101  96 - 112 mEq/L   BUN 11  6 - 23 mg/dL   Creatinine, Ser 1.61  0.50 - 1.10 mg/dL   Glucose, Bld 096 (*) 70 - 99 mg/dL   Calcium, Ion 0.45  4.09 - 1.30 mmol/L   TCO2 27  0 - 100 mmol/L   Hemoglobin 12.9  12.0 - 15.0 g/dL   HCT 81.1  91.4 - 78.2 %     EKG Results:  Date: 01/12/2013  Rate: 53  Rhythm: sinus bradycardia  QRS Axis: normal  Intervals: normal  ST/T Wave abnormalities: nonspecific ST changes  Conduction Disutrbances:none  Narrative Interpretation: Sinus bradycardia, nonspecific ST abnormalities, otherwise normal.  Old EKG Reviewed: none available  Radiology:  Dg Chest 2 View  01/12/2013   *RADIOLOGY REPORT*  Clinical Data: Generalized weakness for 4 days, hypertension  CHEST - 2 VIEW  Comparison: 08/25/2012  Findings: Cardiomediastinal silhouette is stable.  Status post CABG.  No acute infiltrate or pleural effusion.  No pulmonary edema.  Stable osteopenia and mild degenerative changes thoracic spine.  IMPRESSION: No active disease.  No significant change.   Original Report Authenticated By: Natasha Mead, M.D.   Assessment:  The encounter diagnosis was Malaise and fatigue.  I think this is most likely due to medication side effect, possibly the amiodarone. In reading the literature, corneal precipitates and fatigue and tiredness are the 2 most common side effects. She also is mildly bradycardic. This may be caused by the amiodarone as well. It may be contributing to her fatigue and tiredness. Her blood pressures are normal however. I have suggested that she call her cardiologist to discuss decreasing the dose of this medication.  Plan:   1.  Meds:  The following meds were prescribed:   Discharge Medication List as of 01/12/2013  4:36 PM      2.  Patient Education/Counseling:  The patient was given appropriate handouts, self care instructions, and instructed in symptomatic relief.  Suggested getting extra rest and fluids.  3.  Follow up:  The patient  was told to follow up if no better in 3 to 4 days, if becoming worse in any way, and given some red flag symptoms such as any chest pain or shortness of breath which would prompt immediate return.  Follow up with Dr. [name] as soon as possible.     Reuben Likes, MD 01/12/13 2137

## 2013-01-15 ENCOUNTER — Telehealth: Payer: Self-pay | Admitting: Cardiology

## 2013-01-15 NOTE — Telephone Encounter (Signed)
Patient is concerned that her Amiodarone is causing her weakness.

## 2013-01-18 MED ORDER — AMIODARONE HCL 200 MG PO TABS: ORAL_TABLET | ORAL | Status: DC

## 2013-01-18 NOTE — Telephone Encounter (Signed)
Decrease amiodarone to 100 mg daily, check in with her in a week to see if she feels better.  If not, needs office followup.

## 2013-01-18 NOTE — Telephone Encounter (Signed)
Pt states she has gradual increase in weakness and fatigue for about a week. She  went to Urgent Care  01/12/13 and saw Dr Lorenz Coaster.  Pt was told by Dr Lorenz Coaster that everything checked out and to ask Dr Shirlee Latch if he thinks her symptoms are from amiodarone. Pt states her BP has been normal, pt gave me other lab results but she does not think she had a CBC done.  Pt currently taking amiodarone 200mg  daily and is asking if her amiodarone dose can be decreased.  I will forward to Dr Shirlee Latch for review.

## 2013-01-18 NOTE — Telephone Encounter (Signed)
Pt advised, verbalized understanding. Pt states she will call in 1 week to let me know how she is feeling.

## 2013-01-18 NOTE — Telephone Encounter (Signed)
LMTCB

## 2013-01-25 DIAGNOSIS — K219 Gastro-esophageal reflux disease without esophagitis: Secondary | ICD-10-CM | POA: Diagnosis not present

## 2013-01-25 DIAGNOSIS — R490 Dysphonia: Secondary | ICD-10-CM | POA: Diagnosis not present

## 2013-01-25 DIAGNOSIS — K146 Glossodynia: Secondary | ICD-10-CM | POA: Diagnosis not present

## 2013-01-28 DIAGNOSIS — N8189 Other female genital prolapse: Secondary | ICD-10-CM | POA: Diagnosis not present

## 2013-02-01 DIAGNOSIS — Z23 Encounter for immunization: Secondary | ICD-10-CM | POA: Diagnosis not present

## 2013-02-12 DIAGNOSIS — R490 Dysphonia: Secondary | ICD-10-CM | POA: Diagnosis not present

## 2013-02-12 DIAGNOSIS — K146 Glossodynia: Secondary | ICD-10-CM | POA: Diagnosis not present

## 2013-02-12 DIAGNOSIS — K219 Gastro-esophageal reflux disease without esophagitis: Secondary | ICD-10-CM | POA: Diagnosis not present

## 2013-03-03 DIAGNOSIS — N8189 Other female genital prolapse: Secondary | ICD-10-CM | POA: Diagnosis not present

## 2013-03-04 ENCOUNTER — Other Ambulatory Visit: Payer: Self-pay

## 2013-03-11 ENCOUNTER — Encounter: Payer: Self-pay | Admitting: Cardiology

## 2013-03-11 ENCOUNTER — Ambulatory Visit (INDEPENDENT_AMBULATORY_CARE_PROVIDER_SITE_OTHER): Payer: Medicare Other | Admitting: Cardiology

## 2013-03-11 VITALS — BP 154/78 | HR 51 | Ht 68.5 in | Wt 160.0 lb

## 2013-03-11 DIAGNOSIS — I2510 Atherosclerotic heart disease of native coronary artery without angina pectoris: Secondary | ICD-10-CM

## 2013-03-11 DIAGNOSIS — I10 Essential (primary) hypertension: Secondary | ICD-10-CM

## 2013-03-11 DIAGNOSIS — E785 Hyperlipidemia, unspecified: Secondary | ICD-10-CM

## 2013-03-11 DIAGNOSIS — I4891 Unspecified atrial fibrillation: Secondary | ICD-10-CM | POA: Diagnosis not present

## 2013-03-11 DIAGNOSIS — I480 Paroxysmal atrial fibrillation: Secondary | ICD-10-CM

## 2013-03-11 MED ORDER — LISINOPRIL 20 MG PO TABS: 20.0000 mg | ORAL_TABLET | Freq: Every day | ORAL | Status: DC

## 2013-03-11 NOTE — Progress Notes (Signed)
Patient ID: Marissa Bowen, female   DOB: 11/26/1926, 79 y.o.   MRN: [id number] PCP: Dr. Amador Cunas  79 y.o. with history of CAD s/p CABG in 1997 and paroxysmal atrial fibrillation presents for cardiology followup.  She was in the ER earlier this year with recurrent atrial fibrillation.  She converted back to NSR in the ER.  Atrial fibrillation was actually first noted back in 11/13. I started her on apixaban and dronedarone. Since that time, she seems to have held NSR.  She has had no tachypalpitations.  She fell into the doughnut hole and had to stop dronedarone due to cost.  I therefore switched her over the amiodarone.  She was on amlodipine for HTN but developed ankle swelling so is now on lisinopril instead.  She denies chest pain, exertional dyspnea, orthopnea, or lightheadedness.  BP has been running high here and at home.  Labs (4/14): K 4, creatinine 0.61 Labs (7/14): K 4.8, creatinine 0.8 Labs (9/14): K 3.8, creatinine 0.8, TSH normal, LFTs normal  ECG: NSR, nonspecific lateral T wave flattening.   PMH: 1. CAD: s/p CABG 1997.  Echo (12/13) with EF 60-65%, no significant valvular abnormalities, normal RV size and systolic function.  2. Paroxysmal atrial fibrillation noted since 11/13.  Very symptomatic.  She, of note, has a history of TIA in 2008.  Was on dronedarone but stopped due to doughnut hole and now on amiodarone.  3. HTN: edema with amlodipine.  4. Hyperlipidemia: Unable to tolerate statins due to myalgias. 5. Migraines 6. GERD 7. IBS 8. Low back pain  SH: Married, lives in [place], nonsmoker.   FH: Father with MI  ROS: All systems reviewed and negative except as per HPI.   Current Outpatient Prescriptions  Medication Sig Dispense Refill  . amiodarone (PACERONE) 200 MG tablet 1 /2 tablet (total 100mg ) daily  15 tablet  6  . apixaban (ELIQUIS) 5 MG TABS tablet Take 1 tablet (5 mg total) by mouth 2 (two) times daily.  60 tablet  6  . calcium-vitamin D (OSCAL WITH D)  500-200 MG-UNIT per tablet Take 1 tablet by mouth daily.      . Flaxseed, Linseed, (FLAXSEED OIL) 1000 MG CAPS Take 1,000 mg by mouth at bedtime.       Marland Kitchen LORazepam (ATIVAN) 0.5 MG tablet TAKE ONE TABLET EVERY EIGHT HOURS AS NEEDED FOR ANXIETY OR SLEEP  90 tablet  2  . metoprolol succinate (TOPROL XL) 25 MG 24 hr tablet 1/2 tablet (total 12.5 mg) daily  15 tablet  6  . Multiple Vitamin (MULTIVITAMIN) tablet Take 1 tablet by mouth daily.        . niacin 500 MG CR capsule Take 1,000 mg by mouth at bedtime.       . Omega-3 Fatty Acids (FISH OIL) 1000 MG CAPS Take 1,000 mg by mouth 2 (two) times daily.       . pantoprazole (PROTONIX) 40 MG tablet TAKE ONE TABLET BY MOUTH ONCE DAILY  90 tablet  1  . traZODone (DESYREL) 50 MG tablet Take 0.5 tablets (25 mg total) by mouth at bedtime as needed for sleep.  60 tablet  2  . vitamin C (ASCORBIC ACID) 500 MG tablet Take 500 mg by mouth daily.        Marland Kitchen lisinopril (PRINIVIL,ZESTRIL) 20 MG tablet Take 1 tablet (20 mg total) by mouth daily.  30 tablet  6  . [DISCONTINUED] sertraline (ZOLOFT) 25 MG tablet Take 1 tablet (25 mg total) by mouth daily.  30 tablet  2  . [DISCONTINUED] zolpidem (AMBIEN) 5 MG tablet Take 1 tablet (5 mg total) by mouth at bedtime as needed for sleep.  30 tablet  0   No current facility-administered medications for this visit.    BP 154/78  Pulse 51  Ht 5' 8.5" (1.74 m)  Wt 160 lb (72.576 kg)  BMI 23.97 kg/m2 General: NAD Neck: No JVD, no thyromegaly or thyroid nodule.  Lungs: Clear to auscultation bilaterally with normal respiratory effort. CV: Nondisplaced PMI.  Heart regular S1/S2, no S3/S4, no murmur.  No peripheral edema.  No carotid bruit.  Normal pedal pulses.  Abdomen: Soft, nontender, no hepatosplenomegaly, no distention.  Neurologic: Alert and oriented x 3.  Psych: Normal affect. Extremities: No clubbing or cyanosis.   Assessment/Plan: 1. Atrial fibrillation: Paroxysmal.  She is very symptomatic when she goes into  atrial fibrillation.  She is now holding NSR on amiodarone (had to stop dronedarone due to cost).   - Continue Toprol XL.  - Continue amiodarone at 100 mg daily.  LFTs/TSH normal in 9/14.  She knows to get a yearly eye exam.   - Continue Eliquis at 5 mg bid and check CBC.  2. CAD: Prior CABG.  No ischemic symptoms.  She cannot tolerate statins.  She is not on ASA because she is on Eliquis.  Continue Toprol XL.  3. Hyperlipidemia: On Niacin because she does not tolerate statins.  4. HTN: BP running high.  Will increase lisinopril to 20 mg daily with BMET in 2 wks.   [name] 03/11/2013

## 2013-03-11 NOTE — Patient Instructions (Signed)
Increase lisinopril to 20mg  daily. You can take 2 of your 10 mg tablets daily at the same time and use your current supply.  Your physician recommends that you return for lab work on Monday December 1,2014--the lab is open from 7:30AM-5PM.  Your physician wants you to follow-up in: 6 months with Dr Shirlee Latch. (May 2015).  You will receive a reminder letter in the mail two months in advance. If you don't receive a letter, please call our office to schedule the follow-up appointment.

## 2013-03-17 ENCOUNTER — Other Ambulatory Visit: Payer: Self-pay | Admitting: Internal Medicine

## 2013-03-29 ENCOUNTER — Other Ambulatory Visit: Payer: Self-pay | Admitting: Internal Medicine

## 2013-03-29 ENCOUNTER — Other Ambulatory Visit (INDEPENDENT_AMBULATORY_CARE_PROVIDER_SITE_OTHER): Payer: Medicare Other

## 2013-03-29 DIAGNOSIS — I4891 Unspecified atrial fibrillation: Secondary | ICD-10-CM

## 2013-03-29 LAB — CBC WITH DIFFERENTIAL/PLATELET
Basophils Relative: 0.4 % (ref 0.0–3.0)
Eosinophils Absolute: 0.1 10*3/uL (ref 0.0–0.7)
HCT: 33 % — ABNORMAL LOW (ref 36.0–46.0)
Hemoglobin: 10.9 g/dL — ABNORMAL LOW (ref 12.0–15.0)
Lymphs Abs: 1.4 10*3/uL (ref 0.7–4.0)
MCHC: 33 g/dL (ref 30.0–36.0)
MCV: 82.3 fl (ref 78.0–100.0)
Monocytes Absolute: 0.7 10*3/uL (ref 0.1–1.0)
Neutro Abs: 3.6 10*3/uL (ref 1.4–7.7)
RBC: 4.01 Mil/uL (ref 3.87–5.11)

## 2013-03-29 LAB — BASIC METABOLIC PANEL
CO2: 29 mEq/L (ref 19–32)
Chloride: 102 mEq/L (ref 96–112)
Sodium: 139 mEq/L (ref 135–145)

## 2013-04-07 DIAGNOSIS — K146 Glossodynia: Secondary | ICD-10-CM | POA: Diagnosis not present

## 2013-04-07 DIAGNOSIS — R350 Frequency of micturition: Secondary | ICD-10-CM | POA: Diagnosis not present

## 2013-04-07 DIAGNOSIS — R070 Pain in throat: Secondary | ICD-10-CM | POA: Diagnosis not present

## 2013-04-07 DIAGNOSIS — N8182 Incompetence or weakening of pubocervical tissue: Secondary | ICD-10-CM | POA: Diagnosis not present

## 2013-04-08 ENCOUNTER — Encounter: Payer: Self-pay | Admitting: Internal Medicine

## 2013-04-12 ENCOUNTER — Telehealth: Payer: Self-pay | Admitting: *Deleted

## 2013-04-12 NOTE — Telephone Encounter (Signed)
Working on Landscape architect for patient assistance through [employer] for [employer], I have completed prescriber part, patient is going to bring her part of application up and I will fax all together

## 2013-04-13 ENCOUNTER — Encounter: Payer: Self-pay | Admitting: *Deleted

## 2013-04-16 ENCOUNTER — Other Ambulatory Visit: Payer: Self-pay | Admitting: Cardiology

## 2013-04-26 ENCOUNTER — Other Ambulatory Visit: Payer: Self-pay | Admitting: Internal Medicine

## 2013-05-20 DIAGNOSIS — N8189 Other female genital prolapse: Secondary | ICD-10-CM | POA: Diagnosis not present

## 2013-06-02 ENCOUNTER — Other Ambulatory Visit (HOSPITAL_COMMUNITY): Payer: Self-pay | Admitting: Cardiology

## 2013-06-02 DIAGNOSIS — I6529 Occlusion and stenosis of unspecified carotid artery: Secondary | ICD-10-CM

## 2013-06-11 ENCOUNTER — Encounter (HOSPITAL_COMMUNITY): Payer: Medicare Other

## 2013-06-11 ENCOUNTER — Ambulatory Visit: Payer: Medicare Other | Admitting: Internal Medicine

## 2013-06-17 ENCOUNTER — Encounter (HOSPITAL_COMMUNITY): Payer: Medicare Other

## 2013-06-20 ENCOUNTER — Encounter (HOSPITAL_COMMUNITY): Payer: Self-pay | Admitting: Emergency Medicine

## 2013-06-20 ENCOUNTER — Emergency Department (INDEPENDENT_AMBULATORY_CARE_PROVIDER_SITE_OTHER)
Admission: EM | Admit: 2013-06-20 | Discharge: 2013-06-20 | Disposition: A | Discharge status: Home / Self Care | Payer: Medicare Other | Source: Home / Self Care

## 2013-06-20 ENCOUNTER — Emergency Department (INDEPENDENT_AMBULATORY_CARE_PROVIDER_SITE_OTHER): Payer: Medicare Other

## 2013-06-20 DIAGNOSIS — Y92009 Unspecified place in unspecified non-institutional (private) residence as the place of occurrence of the external cause: Secondary | ICD-10-CM | POA: Diagnosis not present

## 2013-06-20 DIAGNOSIS — S99919A Unspecified injury of unspecified ankle, initial encounter: Secondary | ICD-10-CM | POA: Diagnosis not present

## 2013-06-20 DIAGNOSIS — S8001XA Contusion of right knee, initial encounter: Secondary | ICD-10-CM

## 2013-06-20 DIAGNOSIS — W19XXXA Unspecified fall, initial encounter: Secondary | ICD-10-CM

## 2013-06-20 DIAGNOSIS — S8990XA Unspecified injury of unspecified lower leg, initial encounter: Secondary | ICD-10-CM | POA: Diagnosis not present

## 2013-06-20 DIAGNOSIS — M25569 Pain in unspecified knee: Secondary | ICD-10-CM | POA: Diagnosis not present

## 2013-06-20 DIAGNOSIS — S8000XA Contusion of unspecified knee, initial encounter: Secondary | ICD-10-CM

## 2013-06-20 NOTE — Discharge Instructions (Signed)
Contusion A contusion is a deep bruise. Contusions are the result of an injury that caused bleeding under the skin. The contusion may turn blue, purple, or yellow. Minor injuries will give you a painless contusion, but more severe contusions may stay painful and swollen for a few weeks.  CAUSES  A contusion is usually caused by a blow, trauma, or direct force to an area of the body. SYMPTOMS   Swelling and redness of the injured area.  Bruising of the injured area.  Tenderness and soreness of the injured area.  Pain. DIAGNOSIS  The diagnosis can be made by taking a history and physical exam. An X-ray, CT scan, or MRI may be needed to determine if there were any associated injuries, such as fractures. TREATMENT  Specific treatment will depend on what area of the body was injured. In general, the best treatment for a contusion is resting, icing, elevating, and applying cold compresses to the injured area. Over-the-counter medicines may also be recommended for pain control. Ask your caregiver what the best treatment is for your contusion. HOME CARE INSTRUCTIONS   Put ice on the injured area.  Put ice in a plastic bag.  Place a towel between your skin and the bag.  Leave the ice on for 15-20 minutes, 03-04 times a day.  Only take over-the-counter or prescription medicines for pain, discomfort, or fever as directed by your caregiver. Your caregiver may recommend avoiding anti-inflammatory medicines (aspirin, ibuprofen, and naproxen) for 48 hours because these medicines may increase bruising.  Rest the injured area.  If possible, elevate the injured area to reduce swelling. SEEK IMMEDIATE MEDICAL CARE IF:   You have increased bruising or swelling.  You have pain that is getting worse.  Your swelling or pain is not relieved with medicines. MAKE SURE YOU:   Understand these instructions.  Will watch your condition.  Will get help right away if you are not doing well or get  worse. Document Released: 01/23/2005 Document Revised: 07/08/2011 Document Reviewed: 02/18/2011 [facility] Patient Information 2014 [place], [place].  Fall Prevention and Home Safety Falls cause injuries and can affect all age groups. It is possible to use preventive measures to significantly decrease the likelihood of falls. There are many simple measures which can make your home safer and prevent falls. OUTDOORS  Repair cracks and edges of walkways and driveways.  Remove high doorway thresholds.  Trim shrubbery on the main path into your home.  Have good outside lighting.  Clear walkways of tools, rocks, debris, and clutter.  Check that handrails are not broken and are securely fastened. Both sides of steps should have handrails.  Have leaves, snow, and ice cleared regularly.  Use sand or salt on walkways during winter months.  In the garage, clean up grease or oil spills. BATHROOM  Install night lights.  Install grab bars by the toilet and in the tub and shower.  Use non-skid mats or decals in the tub or shower.  Place a plastic non-slip stool in the shower to sit on, if needed.  Keep floors dry and clean up all water on the floor immediately.  Remove soap buildup in the tub or shower on a regular basis.  Secure bath mats with non-slip, double-sided rug tape.  Remove throw rugs and tripping hazards from the floors. BEDROOMS  Install night lights.  Make sure a bedside light is easy to reach.  Do not use oversized bedding.  Keep a telephone by your bedside.  Have a firm chair  with side arms to use for getting dressed.  Remove throw rugs and tripping hazards from the floor. KITCHEN  Keep handles on pots and pans turned toward the center of the stove. Use back burners when possible.  Clean up spills quickly and allow time for drying.  Avoid walking on wet floors.  Avoid hot utensils and knives.  Position shelves so they are not too high or low.  Place  commonly used objects within easy reach.  If necessary, use a sturdy step stool with a grab bar when reaching.  Keep electrical cables out of the way.  Do not use floor polish or wax that makes floors slippery. If you must use wax, use non-skid floor wax.  Remove throw rugs and tripping hazards from the floor. STAIRWAYS  Never leave objects on stairs.  Place handrails on both sides of stairways and use them. Fix any loose handrails. Make sure handrails on both sides of the stairways are as long as the stairs.  Check carpeting to make sure it is firmly attached along stairs. Make repairs to worn or loose carpet promptly.  Avoid placing throw rugs at the top or bottom of stairways, or properly secure the rug with carpet tape to prevent slippage. Get rid of throw rugs, if possible.  Have an electrician put in a light switch at the top and bottom of the stairs. OTHER FALL PREVENTION TIPS  Wear low-heel or rubber-soled shoes that are supportive and fit well. Wear closed toe shoes.  When using a stepladder, make sure it is fully opened and both spreaders are firmly locked. Do not climb a closed stepladder.  Add color or contrast paint or tape to grab bars and handrails in your home. Place contrasting color strips on first and last steps.  Learn and use mobility aids as needed. Install an electrical emergency response system.  Turn on lights to avoid dark areas. Replace light bulbs that burn out immediately. Get light switches that glow.  Arrange furniture to create clear pathways. Keep furniture in the same place.  Firmly attach carpet with non-skid or double-sided tape.  Eliminate uneven floor surfaces.  Select a carpet pattern that does not visually hide the edge of steps.  Be aware of all pets. OTHER HOME SAFETY TIPS  Set the water temperature for 120 F (48.8 C).  Keep emergency numbers on or near the telephone.  Keep smoke detectors on every level of the home and near  sleeping areas. Document Released: 04/05/2002 Document Revised: 10/15/2011 Document Reviewed: 07/05/2011 [facility] Patient Information 2014 [place].

## 2013-06-20 NOTE — ED Provider Notes (Signed)
CSN: [id number]     Arrival date & time 06/20/13  1047 History   First MD Initiated Contact with Patient 06/20/13 1102     Chief Complaint  Patient presents with  . Fall     (Consider location/radiation/quality/duration/timing/severity/associated sxs/prior Treatment) HPI Comments: A 80 year old female patient she tripped on an oxygen keeping last night and fell in her home. She injured her right knee but does not remember exactly how she fell. This is her only complaint.    Past Medical History  Diagnosis Date  . BACK PAIN, LUMBAR 09/27/2009  . CORONARY ARTERY DISEASE     a. s/p remote CABG 1997, 3V.  Marland Kitchen DIVERTICULITIS, HX OF 11/25/2006  . FATIGUE 07/21/2008  . GERD 11/25/2006  . HYPERLIPIDEMIA   . HYPERTENSION   . IBS 12/07/2008  . MIGRAINE WITH AURA 08/13/2007  . Muscle weakness (generalized) 09/27/2009  . OSTEOARTHRITIS 08/13/2007  . TRANSIENT ISCHEMIC ATTACK 04/13/2007  . Hiatal hernia   . Paroxysmal atrial fibrillation Dx 02/2012  . Anxiety   . CVA (cerebral infarction)     a. Thalamic infarct 09/2009.  Marland Kitchen Anemia   . GI bleed     a. 09/2009: secondary to antral ulcer.  . Mild carotid artery disease     a. 0-39% bilat 05/2011 (f/u recommended 05/2013).  . Gastric ulcer    Past Surgical History  Procedure Laterality Date  . Coronary artery bypass graft    . Abdominal hysterectomy    . Tonsillectomy     Family History  Problem Relation Age of Onset  . Heart attack Mother   . Heart failure Father   . Throat cancer Sister   . Diabetes Sister   . Coronary artery disease Sister   . Colon cancer Neg Hx    History  Substance Use Topics  . Smoking status: Never Smoker   . Smokeless tobacco: Never Used  . Alcohol Use: No   OB History   Grav Para Term Preterm Abortions TAB SAB Ect Mult Living                 Review of Systems  Constitutional: Negative for fever, chills and activity change.  HENT: Negative.   Respiratory: Negative.   Cardiovascular: Negative.    Musculoskeletal: Positive for arthralgias. Negative for back pain, neck pain and neck stiffness.       As per HPI  Skin: Negative for color change, pallor and rash.  Neurological: Negative.       Allergies  Hydrocodone; Ibuprofen; Oxycodone hcl; Prednisone; Statins; and Penicillins  Home Medications   Current Outpatient Rx  Name  Route  Sig  Dispense  Refill  . amiodarone (PACERONE) 200 MG tablet      1 /2 tablet (total 100mg ) daily   15 tablet   6   . apixaban (ELIQUIS) 5 MG TABS tablet   Oral   Take 1 tablet (5 mg total) by mouth 2 (two) times daily.   60 tablet   6   . calcium-vitamin D (OSCAL WITH D) 500-200 MG-UNIT per tablet   Oral   Take 1 tablet by mouth daily.         . Flaxseed, Linseed, (FLAXSEED OIL) 1000 MG CAPS   Oral   Take 1,000 mg by mouth at bedtime.          Marland Kitchen lisinopril (PRINIVIL,ZESTRIL) 20 MG tablet   Oral   Take 1 tablet (20 mg total) by mouth daily.   30 tablet   6   .  LORazepam (ATIVAN) 0.5 MG tablet      TAKE ONE TABLET EVERY EIGHT HOURS AS NEEDED FOR ANXIETY OR SLEEP   90 tablet   2   . metoprolol succinate (TOPROL-XL) 25 MG 24 hr tablet      TAKE ONE-HALF TABLET DAILY   15 tablet   5   . Multiple Vitamin (MULTIVITAMIN) tablet   Oral   Take 1 tablet by mouth daily.           . niacin 500 MG CR capsule   Oral   Take 1,000 mg by mouth at bedtime.          . Omega-3 Fatty Acids (FISH OIL) 1000 MG CAPS   Oral   Take 1,000 mg by mouth 2 (two) times daily.          . pantoprazole (PROTONIX) 40 MG tablet      TAKE ONE TABLET BY MOUTH ONCE DAILY   90 tablet   1   . traZODone (DESYREL) 50 MG tablet   Oral   Take 0.5 tablets (25 mg total) by mouth at bedtime as needed for sleep.   60 tablet   2   . vitamin C (ASCORBIC ACID) 500 MG tablet   Oral   Take 500 mg by mouth daily.            BP 165/53  Pulse 55  Temp(Src) 97.8 F (36.6 C) (Oral)  Resp 18  SpO2 100% Physical Exam  Nursing note and vitals  reviewed. Constitutional: She is oriented to person, place, and time. She appears well-developed and well-nourished. No distress.  HENT:  Head: Normocephalic and atraumatic.  Eyes: EOM are normal. Pupils are equal, round, and reactive to light.  Neck: Normal range of motion. Neck supple.  Pulmonary/Chest: Effort normal. No respiratory distress.  Musculoskeletal:  Mild erythema to the right patella. Puffiness over the medial and lateral joint space. Tenderness over the right patella. Mild tenderness along the medial and lateral joint line. No deformity appreciated. No laxity appreciated. Negative drawer, negative varus negative valgus. Extension 280 mg flexion to 95. Distal neurovascular motor sensory is intact.  Neurological: She is alert and oriented to person, place, and time. No cranial nerve deficit.  Skin: Skin is warm and dry.  Psychiatric: She has a normal mood and affect.    ED Course  Procedures (including critical care time) Labs Review Labs Reviewed - No data to display Imaging Review Dg Knee Complete 4 Views Right  06/20/2013   CLINICAL DATA:  Fall, knee pain  EXAM: RIGHT KNEE - COMPLETE 4+ VIEW  COMPARISON:  None.  FINDINGS: Negative for fracture. Joint spaces are normal. No significant effusion. Surgical clips in the medial soft tissues.  IMPRESSION: Negative for fracture.   Electronically Signed   By: Franchot Gallo M.D.   On: 06/20/2013 11:42      MDM   Final diagnoses:  Contusion of knee, right  Fall at home    Wear knee brace for about 4 days. Remove periodically for ROM as demo'd Ice for 2-3 d. Tylenol for pain See PCP if not improved in 3-4 d    Marissa Napoleon, NP 06/20/13 1154

## 2013-06-20 NOTE — ED Notes (Signed)
PT  REPORTS   PAIN  R  KNEE      SHE  REPORTS  SHE  FELLED  LAST  PM    SHE  WAS  ABLE  TO  AMBULATE  BUT  HAS  PAIN  ON  WEIGHT BEARING AND  MOVEMENTS

## 2013-06-22 NOTE — ED Provider Notes (Signed)
Medical screening examination/treatment/procedure(s) were performed by a resident physician or non-physician practitioner and as the supervising physician I was immediately available for consultation/collaboration.  Lynne Leader, MD    Gregor Hams, MD 06/22/13 [phone]

## 2013-06-23 ENCOUNTER — Encounter (HOSPITAL_COMMUNITY): Payer: Medicare Other

## 2013-06-25 ENCOUNTER — Encounter (HOSPITAL_COMMUNITY): Payer: Medicare Other

## 2013-06-28 ENCOUNTER — Ambulatory Visit (HOSPITAL_COMMUNITY): Payer: Medicare Other | Attending: Cardiology

## 2013-06-28 DIAGNOSIS — I6529 Occlusion and stenosis of unspecified carotid artery: Secondary | ICD-10-CM | POA: Insufficient documentation

## 2013-07-01 DIAGNOSIS — N8110 Cystocele, unspecified: Secondary | ICD-10-CM | POA: Diagnosis not present

## 2013-07-05 ENCOUNTER — Other Ambulatory Visit: Payer: Self-pay | Admitting: Cardiology

## 2013-08-03 ENCOUNTER — Encounter: Payer: Self-pay | Admitting: Internal Medicine

## 2013-08-03 ENCOUNTER — Ambulatory Visit (INDEPENDENT_AMBULATORY_CARE_PROVIDER_SITE_OTHER): Payer: Medicare Other | Admitting: Internal Medicine

## 2013-08-03 VITALS — BP 140/68 | HR 50 | Temp 97.6°F | Ht 67.0 in | Wt 162.6 lb

## 2013-08-03 DIAGNOSIS — R110 Nausea: Secondary | ICD-10-CM

## 2013-08-03 DIAGNOSIS — Z79899 Other long term (current) drug therapy: Secondary | ICD-10-CM

## 2013-08-03 DIAGNOSIS — F341 Dysthymic disorder: Secondary | ICD-10-CM | POA: Diagnosis not present

## 2013-08-03 DIAGNOSIS — D638 Anemia in other chronic diseases classified elsewhere: Secondary | ICD-10-CM

## 2013-08-03 DIAGNOSIS — E785 Hyperlipidemia, unspecified: Secondary | ICD-10-CM

## 2013-08-03 DIAGNOSIS — I4891 Unspecified atrial fibrillation: Secondary | ICD-10-CM | POA: Diagnosis not present

## 2013-08-03 DIAGNOSIS — K219 Gastro-esophageal reflux disease without esophagitis: Secondary | ICD-10-CM | POA: Diagnosis not present

## 2013-08-03 DIAGNOSIS — I10 Essential (primary) hypertension: Secondary | ICD-10-CM

## 2013-08-03 DIAGNOSIS — I480 Paroxysmal atrial fibrillation: Secondary | ICD-10-CM

## 2013-08-03 DIAGNOSIS — I6529 Occlusion and stenosis of unspecified carotid artery: Secondary | ICD-10-CM | POA: Diagnosis not present

## 2013-08-03 DIAGNOSIS — M1990 Unspecified osteoarthritis, unspecified site: Secondary | ICD-10-CM

## 2013-08-03 DIAGNOSIS — F418 Other specified anxiety disorders: Secondary | ICD-10-CM

## 2013-08-03 MED ORDER — LORAZEPAM 0.5 MG PO TABS: 0.5000 mg | ORAL_TABLET | Freq: Three times a day (TID) | ORAL | Status: DC | PRN

## 2013-08-03 MED ORDER — ESCITALOPRAM OXALATE 10 MG PO TABS: 10.0000 mg | ORAL_TABLET | Freq: Every day | ORAL | Status: DC

## 2013-08-03 MED ORDER — PANTOPRAZOLE SODIUM 40 MG PO TBEC: 40.0000 mg | DELAYED_RELEASE_TABLET | Freq: Two times a day (BID) | ORAL | Status: DC

## 2013-08-03 NOTE — Progress Notes (Signed)
Patient ID: Marissa Bowen, female   DOB: Sep 15, 1926, 80 y.o.   MRN: [id number]    Chief Complaint  Patient presents with  . Establish Care    new patient  . other    stomach ache and nasuea   . Immunizations    declines Tdap, [place], Pneumo vaccines today   Allergies  Allergen Reactions  . Hydrocodone     REACTION: migraines  . Ibuprofen Other (See Comments)    ulcers  . Oxycodone Hcl     REACTION: migraines  . Prednisone Other (See Comments)    ulcers  . Statins Other (See Comments)    Aches and pains  . Penicillins Swelling and Rash    HPI 80 y/o female patient is here to establish care. She was being seen by Dr Jethro Bastos in [place] at [facility] 6 months back.  She has intermittent cough and sore throat for 6 months and has seen ENT for this. No problem noted on laryngoscopy as per patient She has been having nausea in the morning when she gets up and occassional epigastric discomfort She has hx of afib, CAD, bladder prolapse and sees Dr Aundra Dubin from cardiology and Dr Merrily Brittle for Gyn.  Review of Systems  Constitutional: Negative for fever, chills, weight loss. Feels her energy level has improved slightly from before HENT: Negative for congestion. Has hearing loss in right ear Eyes: Negative for blurred vision, double vision and discharge. wears reading glasses. Has hx of cataract surgery in both her eyes. Has not seen eye doctor in few years Respiratory: Negative for shortness of breath and wheezing   Cardiovascular: Negative for chest pain, palpitations, orthopnea and leg swelling.  Gastrointestinal: has nausea in the morning. One episode of vomiting few weeks back, denies blood in int. Felt it was all acid.Negative for abdominal pain, diarrhea and constipation. last bowel movement this morning Genitourinary: she uses a pessary for years. She has occassional urine dribbling. Negative for dysuria, flank pain.  Musculoskeletal: Negative for back pain,  falls, joint pain and myalgias.  Skin: Negative for itching and rash.  Neurological: Negative for dizziness, tingling, focal weakness and headaches.  Psychiatric/Behavioral: Negative for memory loss. The patient is not nervous/anxious.  feels low at times and feels overwhelmed with health issues of her husband and mentions she has no help. She then gets tearful  Past Medical History  Diagnosis Date  . BACK PAIN, LUMBAR 09/27/2009  . CORONARY ARTERY DISEASE     a. s/p remote CABG 1997, 3V.  Marland Kitchen DIVERTICULITIS, HX OF 11/25/2006  . FATIGUE 07/21/2008  . GERD 11/25/2006  . HYPERLIPIDEMIA   . HYPERTENSION   . IBS 12/07/2008  . MIGRAINE WITH AURA 08/13/2007  . Muscle weakness (generalized) 09/27/2009  . OSTEOARTHRITIS 08/13/2007  . TRANSIENT ISCHEMIC ATTACK 04/13/2007  . Hiatal hernia   . Paroxysmal atrial fibrillation Dx 02/2012  . Anxiety   . CVA (cerebral infarction)     a. Thalamic infarct 09/2009.  Marland Kitchen Anemia   . GI bleed     a. 09/2009: secondary to antral ulcer.  . Mild carotid artery disease     a. 0-39% bilat 05/2011 (f/u recommended 05/2013).  . Gastric ulcer   . Heart attack   . Acute fibrinous pericarditis    Past Surgical History  Procedure Laterality Date  . Tonsillectomy    . Abdominal hysterectomy  1973  . Coronary artery bypass graft  1997    Dr Melvenia Needles  . Coronary angioplasty  1997  Dr Lia Foyer  . Stroke  2013    Dr Lia Foyer   Family History  Problem Relation Age of Onset  . Heart attack Mother   . Heart failure Father   . Throat cancer Sister   . Heart disease Sister   . Coronary artery disease Sister   . Colon cancer Neg Hx   . Diabetes Sister    History   Social History  . Marital Status: Married    Spouse Name: N/A    Number of Children: N/A  . Years of Education: N/A   Occupational History  . Not on file.   Social History Main Topics  . Smoking status: Never Smoker   . Smokeless tobacco: Never Used  . Alcohol Use: No  . Drug Use: No  . Sexual  Activity: No   Other Topics Concern  . Not on file   Social History Narrative  . No narrative on file   Current Outpatient Prescriptions on File Prior to Visit  Medication Sig Dispense Refill  . amiodarone (PACERONE) 200 MG tablet 1 /2 tablet (total 100mg ) daily  15 tablet  6  . calcium-vitamin D (OSCAL WITH D) 500-200 MG-UNIT per tablet Take 1 tablet by mouth daily.      Marland Kitchen ELIQUIS 5 MG TABS tablet TAKE ONE TABLET TWICE DAILY  60 tablet  1  . Flaxseed, Linseed, (FLAXSEED OIL) 1000 MG CAPS Take 1,000 mg by mouth at bedtime.       Marland Kitchen lisinopril (PRINIVIL,ZESTRIL) 20 MG tablet Take 1 tablet (20 mg total) by mouth daily.  30 tablet  6  . metoprolol succinate (TOPROL-XL) 25 MG 24 hr tablet TAKE ONE-HALF TABLET DAILY  15 tablet  5  . Multiple Vitamin (MULTIVITAMIN) tablet Take 1 tablet by mouth daily.        . niacin 500 MG CR capsule Take 1,000 mg by mouth at bedtime.       . Omega-3 Fatty Acids (FISH OIL) 1000 MG CAPS Take 1,000 mg by mouth 2 (two) times daily.       . vitamin C (ASCORBIC ACID) 500 MG tablet Take 500 mg by mouth daily.        . [DISCONTINUED] sertraline (ZOLOFT) 25 MG tablet Take 1 tablet (25 mg total) by mouth daily.  30 tablet  2  . [DISCONTINUED] zolpidem (AMBIEN) 5 MG tablet Take 1 tablet (5 mg total) by mouth at bedtime as needed for sleep.  30 tablet  0   No current facility-administered medications on file prior to visit.    Physical exam BP 140/68  Pulse 50  Temp(Src) 97.6 F (36.4 C) (Oral)  Ht 5\' 7"  (1.702 m)  Wt 162 lb 9.6 oz (73.755 kg)  BMI 25.46 kg/m2  General- elderly female in no acute distress Head- atraumatic, normocephalic Eyes- PERRLA, EOMI, no pallor, no icterus, no discharge Ears- left ear normal tympanic membrane and normal external ear canal , right ear normal tympanic membrane and normal external ear canal Neck- no lymphadenopathy, no thyromegaly, no jugular vein distension, no carotid bruit Nose- normal nasaal mucosa, no maxillary sinus  tenderness, no frontal sinus tenderness Mouth- normal mucus membrane, no oral thrush, normal oropharynx Cardiovascular- normal s1,s2, no murmurs/ rubs/ gallops, normal distal pulses Respiratory- bilateral clear to auscultation, no wheeze, no rhonchi, no crackles Abdomen- bowel sounds present, soft, non tender, no CVA tenderness Musculoskeletal- able to move all 4 extremities, no spinal and paraspinal tenderness, steady gait, no use of assistive device, normal range of motion,  no leg edema Neurological- no focal deficit Skin- warm and dry Psychiatry- alert and oriented to person, place and time  Test performed- Depression screening scored 8 on PHQ9  Assessment/plan  1. GERD With her history of ongoing nausea in morning, ocassional heartburn and epigastric discomfort in morning, will give her a trial of PPI with concerns for mild gastritis vs GERD. Recheck h/h prior to next visit. Advised to avoid NSAIDs for now - pantoprazole (PROTONIX) 40 MG tablet; Take 1 tablet (40 mg total) by mouth 2 (two) times daily.  Dispense: 180 tablet; Refill: 1  2. Nausea alone See above.also advised to avoid skipping meals  3. Depression with anxiety Will initiate lexapro for now. Continue her prn ativan.  - escitalopram (LEXAPRO) 10 MG tablet; Take 1 tablet (10 mg total) by mouth daily.  Dispense: 302 tablet; Refill: 2 - TSH; Future  4. Paroxysmal atrial fibrillation Rate controlled. Continue amiodarone and eliquis with toprol xl for tate control. Monitor thyroid function on intervals  5. HYPERTENSION Stable. Continue lisinopril 20 mg daily - CMP; Future - Lipid Panel; Future - CBC with Differential; Future  6. HYPERLIPIDEMIA Continue fish oil with niacin and welchol for now - Lipid Panel; Future  7. Anemia of chronic disease Monitor h/h. Continue MVI - CBC with Differential; Future  8. On amiodarone therapy - TSH; Future  9. Osteoarthritis Stable. Continue prn tylenol. Continue ca-vit  d

## 2013-08-06 ENCOUNTER — Other Ambulatory Visit: Payer: Medicare Other

## 2013-08-06 ENCOUNTER — Telehealth: Payer: Self-pay | Admitting: *Deleted

## 2013-08-06 DIAGNOSIS — R195 Other fecal abnormalities: Secondary | ICD-10-CM

## 2013-08-06 DIAGNOSIS — E785 Hyperlipidemia, unspecified: Secondary | ICD-10-CM

## 2013-08-06 DIAGNOSIS — F341 Dysthymic disorder: Secondary | ICD-10-CM | POA: Diagnosis not present

## 2013-08-06 DIAGNOSIS — D638 Anemia in other chronic diseases classified elsewhere: Secondary | ICD-10-CM | POA: Diagnosis not present

## 2013-08-06 DIAGNOSIS — I10 Essential (primary) hypertension: Secondary | ICD-10-CM

## 2013-08-06 DIAGNOSIS — F418 Other specified anxiety disorders: Secondary | ICD-10-CM

## 2013-08-06 DIAGNOSIS — Z79899 Other long term (current) drug therapy: Secondary | ICD-10-CM | POA: Diagnosis not present

## 2013-08-06 NOTE — Telephone Encounter (Signed)
Please have her come to the office and check cbc and cmp today. She can also provide a stool sample for FOBT. thanks

## 2013-08-06 NOTE — Telephone Encounter (Signed)
Pt notified and lab work done as well as stool card given to pt.

## 2013-08-06 NOTE — Telephone Encounter (Signed)
Pt was seen in the office on 08/03/13 for appt.  She is still having some nausea and has noticed a change in color of stool (darker).  She wants to know if she needs to get a stool sample cards to check for blood.   Please advise. Thanks, [employer]

## 2013-08-06 NOTE — Telephone Encounter (Signed)
Will advise when she stops by after appt at [facility] care.

## 2013-08-07 LAB — CBC WITH DIFFERENTIAL/PLATELET
BASOS ABS: 0 10*3/uL (ref 0.0–0.2)
Basos: 0 %
EOS ABS: 0.3 10*3/uL (ref 0.0–0.4)
Eos: 3 %
HEMATOCRIT: 36.4 % (ref 34.0–46.6)
HEMOGLOBIN: 12 g/dL (ref 11.1–15.9)
IMMATURE GRANULOCYTES: 0 %
Immature Grans (Abs): 0 10*3/uL (ref 0.0–0.1)
LYMPHS ABS: 1.5 10*3/uL (ref 0.7–3.1)
Lymphs: 19 %
MCH: 28.7 pg (ref 26.6–33.0)
MCHC: 33 g/dL (ref 31.5–35.7)
MCV: 87 fL (ref 79–97)
MONOS ABS: 0.8 10*3/uL (ref 0.1–0.9)
Monocytes: 10 %
NEUTROS ABS: 5.4 10*3/uL (ref 1.4–7.0)
Neutrophils Relative %: 68 %
RBC: 4.18 x10E6/uL (ref 3.77–5.28)
RDW: 16.3 % — AB (ref 12.3–15.4)
WBC: 8 10*3/uL (ref 3.4–10.8)

## 2013-08-07 LAB — LIPID PANEL
CHOLESTEROL TOTAL: 193 mg/dL (ref 100–199)
Chol/HDL Ratio: 3.3 ratio units (ref 0.0–4.4)
HDL: 58 mg/dL (ref >39)
LDL Calculated: 114 mg/dL — ABNORMAL HIGH (ref 0–99)
TRIGLYCERIDES: 105 mg/dL (ref 0–149)
VLDL Cholesterol Cal: 21 mg/dL (ref 5–40)

## 2013-08-07 LAB — COMPREHENSIVE METABOLIC PANEL
ALBUMIN: 4.4 g/dL (ref 3.5–4.7)
ALK PHOS: 68 IU/L (ref 39–117)
ALT: 14 IU/L (ref 0–32)
AST: 17 IU/L (ref 0–40)
Albumin/Globulin Ratio: 2 (ref 1.1–2.5)
BUN / CREAT RATIO: 20 (ref 11–26)
BUN: 15 mg/dL (ref 8–27)
CALCIUM: 9.3 mg/dL (ref 8.7–10.3)
CHLORIDE: 98 mmol/L (ref 97–108)
CO2: 25 mmol/L (ref 18–29)
CREATININE: 0.74 mg/dL (ref 0.57–1.00)
GFR calc Af Amer: 86 mL/min/{1.73_m2} (ref >59)
GFR calc non Af Amer: 75 mL/min/{1.73_m2} (ref >59)
GLOBULIN, TOTAL: 2.2 g/dL (ref 1.5–4.5)
Glucose: 99 mg/dL (ref 65–99)
Potassium: 4.3 mmol/L (ref 3.5–5.2)
Sodium: 140 mmol/L (ref 134–144)
Total Bilirubin: 0.3 mg/dL (ref 0.0–1.2)
Total Protein: 6.6 g/dL (ref 6.0–8.5)

## 2013-08-07 LAB — TSH: TSH: 1.71 u[IU]/mL (ref 0.450–4.500)

## 2013-08-09 ENCOUNTER — Other Ambulatory Visit: Payer: Self-pay | Admitting: Cardiology

## 2013-08-10 LAB — SPECIMEN STATUS REPORT

## 2013-08-16 DIAGNOSIS — N8189 Other female genital prolapse: Secondary | ICD-10-CM | POA: Diagnosis not present

## 2013-09-06 ENCOUNTER — Other Ambulatory Visit: Payer: Self-pay | Admitting: Cardiology

## 2013-09-08 ENCOUNTER — Other Ambulatory Visit: Payer: Self-pay | Admitting: Cardiology

## 2013-09-16 ENCOUNTER — Other Ambulatory Visit: Payer: Self-pay | Admitting: *Deleted

## 2013-09-16 DIAGNOSIS — E785 Hyperlipidemia, unspecified: Secondary | ICD-10-CM

## 2013-09-16 DIAGNOSIS — D631 Anemia in chronic kidney disease: Secondary | ICD-10-CM

## 2013-09-16 DIAGNOSIS — R110 Nausea: Secondary | ICD-10-CM

## 2013-09-16 DIAGNOSIS — I10 Essential (primary) hypertension: Secondary | ICD-10-CM

## 2013-09-16 DIAGNOSIS — N039 Chronic nephritic syndrome with unspecified morphologic changes: Secondary | ICD-10-CM

## 2013-09-17 ENCOUNTER — Telehealth: Payer: Self-pay | Admitting: Internal Medicine

## 2013-09-17 NOTE — Telephone Encounter (Signed)
Medical Records in Clinical next appt folder..[facility]  °

## 2013-09-24 ENCOUNTER — Other Ambulatory Visit: Payer: Medicare Other

## 2013-09-24 DIAGNOSIS — R110 Nausea: Secondary | ICD-10-CM

## 2013-09-24 DIAGNOSIS — E785 Hyperlipidemia, unspecified: Secondary | ICD-10-CM

## 2013-09-24 DIAGNOSIS — I10 Essential (primary) hypertension: Secondary | ICD-10-CM | POA: Diagnosis not present

## 2013-09-25 LAB — COMPREHENSIVE METABOLIC PANEL
ALK PHOS: 72 IU/L (ref 39–117)
ALT: 16 IU/L (ref 0–32)
AST: 18 IU/L (ref 0–40)
Albumin/Globulin Ratio: 1.8 (ref 1.1–2.5)
Albumin: 4.2 g/dL (ref 3.5–4.7)
BILIRUBIN TOTAL: 0.4 mg/dL (ref 0.0–1.2)
BUN / CREAT RATIO: 18 (ref 11–26)
BUN: 13 mg/dL (ref 8–27)
CO2: 26 mmol/L (ref 18–29)
CREATININE: 0.71 mg/dL (ref 0.57–1.00)
Calcium: 9.3 mg/dL (ref 8.7–10.3)
Chloride: 100 mmol/L (ref 97–108)
GFR calc non Af Amer: 78 mL/min/{1.73_m2} (ref >59)
GFR, EST AFRICAN AMERICAN: 90 mL/min/{1.73_m2} (ref >59)
GLOBULIN, TOTAL: 2.3 g/dL (ref 1.5–4.5)
Glucose: 90 mg/dL (ref 65–99)
Potassium: 4.4 mmol/L (ref 3.5–5.2)
Sodium: 139 mmol/L (ref 134–144)
Total Protein: 6.5 g/dL (ref 6.0–8.5)

## 2013-09-25 LAB — CBC WITH DIFFERENTIAL/PLATELET
BASOS ABS: 0 10*3/uL (ref 0.0–0.2)
BASOS: 0 %
EOS ABS: 0.2 10*3/uL (ref 0.0–0.4)
Eos: 4 %
HCT: 37.4 % (ref 34.0–46.6)
Hemoglobin: 12.4 g/dL (ref 11.1–15.9)
Immature Grans (Abs): 0 10*3/uL (ref 0.0–0.1)
Immature Granulocytes: 0 %
LYMPHS: 24 %
Lymphocytes Absolute: 1.2 10*3/uL (ref 0.7–3.1)
MCH: 29.7 pg (ref 26.6–33.0)
MCHC: 33.2 g/dL (ref 31.5–35.7)
MCV: 90 fL (ref 79–97)
Monocytes Absolute: 0.6 10*3/uL (ref 0.1–0.9)
Monocytes: 11 %
NEUTROS PCT: 61 %
Neutrophils Absolute: 3.1 10*3/uL (ref 1.4–7.0)
RBC: 4.17 x10E6/uL (ref 3.77–5.28)
RDW: 14.7 % (ref 12.3–15.4)
WBC: 5.1 10*3/uL (ref 3.4–10.8)

## 2013-09-25 LAB — LIPID PANEL
Chol/HDL Ratio: 3.1 ratio units (ref 0.0–4.4)
Cholesterol, Total: 172 mg/dL (ref 100–199)
HDL: 56 mg/dL (ref >39)
LDL CALC: 101 mg/dL — AB (ref 0–99)
TRIGLYCERIDES: 74 mg/dL (ref 0–149)
VLDL Cholesterol Cal: 15 mg/dL (ref 5–40)

## 2013-09-25 LAB — TSH: TSH: 3.15 u[IU]/mL (ref 0.450–4.500)

## 2013-09-26 ENCOUNTER — Encounter: Payer: Self-pay | Admitting: Internal Medicine

## 2013-09-29 ENCOUNTER — Ambulatory Visit (INDEPENDENT_AMBULATORY_CARE_PROVIDER_SITE_OTHER): Payer: Medicare Other | Admitting: Internal Medicine

## 2013-09-29 ENCOUNTER — Encounter: Payer: Self-pay | Admitting: Internal Medicine

## 2013-09-29 VITALS — BP 140/80 | HR 52 | Temp 98.1°F | Resp 18 | Ht 67.0 in | Wt 161.8 lb

## 2013-09-29 DIAGNOSIS — E785 Hyperlipidemia, unspecified: Secondary | ICD-10-CM

## 2013-09-29 DIAGNOSIS — I2510 Atherosclerotic heart disease of native coronary artery without angina pectoris: Secondary | ICD-10-CM | POA: Diagnosis not present

## 2013-09-29 DIAGNOSIS — Z8673 Personal history of transient ischemic attack (TIA), and cerebral infarction without residual deficits: Secondary | ICD-10-CM | POA: Diagnosis not present

## 2013-09-29 DIAGNOSIS — F418 Other specified anxiety disorders: Secondary | ICD-10-CM

## 2013-09-29 DIAGNOSIS — I6529 Occlusion and stenosis of unspecified carotid artery: Secondary | ICD-10-CM

## 2013-09-29 DIAGNOSIS — F341 Dysthymic disorder: Secondary | ICD-10-CM

## 2013-09-29 DIAGNOSIS — Z23 Encounter for immunization: Secondary | ICD-10-CM | POA: Diagnosis not present

## 2013-09-29 DIAGNOSIS — K219 Gastro-esophageal reflux disease without esophagitis: Secondary | ICD-10-CM

## 2013-09-29 DIAGNOSIS — M1990 Unspecified osteoarthritis, unspecified site: Secondary | ICD-10-CM | POA: Diagnosis not present

## 2013-09-29 DIAGNOSIS — I4891 Unspecified atrial fibrillation: Secondary | ICD-10-CM

## 2013-09-29 DIAGNOSIS — M810 Age-related osteoporosis without current pathological fracture: Secondary | ICD-10-CM

## 2013-09-29 DIAGNOSIS — Z0000 Encounter for general adult medical examination without abnormal findings: Secondary | ICD-10-CM | POA: Insufficient documentation

## 2013-09-29 DIAGNOSIS — I10 Essential (primary) hypertension: Secondary | ICD-10-CM | POA: Diagnosis not present

## 2013-09-29 MED ORDER — VILAZODONE HCL 10 MG PO TABS: 10.0000 mg | ORAL_TABLET | Freq: Every day | ORAL | Status: DC

## 2013-09-29 MED ORDER — ZOSTER VACCINE LIVE 19400 UNT/0.65ML ~~LOC~~ SOLR: 0.6500 mL | Freq: Once | SUBCUTANEOUS | Status: DC

## 2013-09-29 MED ORDER — ZOLPIDEM TARTRATE 5 MG PO TABS: 5.0000 mg | ORAL_TABLET | Freq: Every evening | ORAL | Status: DC | PRN

## 2013-09-29 NOTE — Progress Notes (Signed)
Did'nt pass the clock test. 

## 2013-09-29 NOTE — Progress Notes (Signed)
Patient ID: Marissa Bowen, female   DOB: [birth year], 80 y.o.   MRN: [id number]    Chief Complaint  Patient presents with  . Annual Exam   Allergies  Allergen Reactions  . Hydrocodone     REACTION: migraines  . Ibuprofen Other (See Comments)    ulcers  . Oxycodone Hcl     REACTION: migraines  . Prednisone Other (See Comments)    ulcers  . Statins Other (See Comments)    Aches and pains  . Penicillins Swelling and Rash   HPI 80 y/o female patient is here for her annual exam. She has history of afib, CAD, HTN, GERD and depression among others. She has been tearful this visit. She is finding it overwhelming taking care of her husband's health needs. She has been taking her ativan more than prescribed. She is not able to sleep at night. She stopped her lexapro after taking it for one week because she felt it was not helping with her mood. She follows with cardiology. She has not taken her pneumococcal vaccine and shingles vaccine  Review of Systems  Constitutional: Negative for fever, chills, weight loss, diaphoresis. She gets tired easily HENT: Negative for congestion, hearing loss. She continues to have occassional sore throat and need t clear her throat Eyes: Negative for blurred vision, double vision and discharge. has not seen her eye doctor Respiratory: Negative for cough, sputum production, shortness of breath and wheezing.   Cardiovascular: Negative for chest pain, palpitations, orthopnea and leg swelling.  Gastrointestinal: Negative for heartburn, nausea, vomiting, abdominal pain, diarrhea and constipation.  Genitourinary: Negative for dysuria, flank pain. has urinary incontinence and wears pessaries Musculoskeletal: Negative for back pain, falls, joint pain and myalgias.  Skin: Negative for itching and rash.  Neurological: Negative for dizziness, tingling, focal weakness and headaches.  Psychiatric/Behavioral: Has depression and some memory loss. The patient is nervous and  anxious. unable to sleep at night    Past Medical History  Diagnosis Date  . BACK PAIN, LUMBAR 09/27/2009  . CORONARY ARTERY DISEASE     a. s/p remote CABG 1997, 3V.  Marland Kitchen DIVERTICULITIS, HX OF 11/25/2006  . FATIGUE 07/21/2008  . GERD 11/25/2006  . HYPERLIPIDEMIA   . HYPERTENSION   . IBS 12/07/2008  . MIGRAINE WITH AURA 08/13/2007  . Muscle weakness (generalized) 09/27/2009  . OSTEOARTHRITIS 08/13/2007  . TRANSIENT ISCHEMIC ATTACK 04/13/2007  . Hiatal hernia   . Paroxysmal atrial fibrillation Dx 02/2012  . Anxiety   . CVA (cerebral infarction)     a. Thalamic infarct 09/2009.  Marland Kitchen Anemia   . GI bleed     a. 09/2009: secondary to antral ulcer.  . Mild carotid artery disease     a. 0-39% bilat 05/2011 (f/u recommended 05/2013).  . Gastric ulcer   . Heart attack   . Acute fibrinous pericarditis    Past Surgical History  Procedure Laterality Date  . Tonsillectomy    . Abdominal hysterectomy  1973  . Coronary artery bypass graft  1997    Dr Melvenia Needles  . Coronary angioplasty  1997    Dr Lia Foyer  . Stroke  2013    Dr Lia Foyer   Immunization History  Administered Date(s) Administered  . Influenza Split 02/05/2011, 02/13/2012, 02/02/2013  . Influenza Whole 02/11/2008   Current Outpatient Prescriptions on File Prior to Visit  Medication Sig Dispense Refill  . acetaminophen (TYLENOL) 500 MG tablet Take 500 mg by mouth as needed.      Marland Kitchen amiodarone (  PACERONE) 200 MG tablet TAKE ONE-HALF TABLET DAILY  15 tablet  0  . calcium-vitamin D (OSCAL WITH D) 500-200 MG-UNIT per tablet Take 1 tablet by mouth daily.      Marland Kitchen ELIQUIS 5 MG TABS tablet TAKE ONE TABLET TWICE DAILY  60 tablet  0  . Flaxseed, Linseed, (FLAXSEED OIL) 1000 MG CAPS Take 1,000 mg by mouth at bedtime.       . fluticasone (FLONASE) 50 MCG/ACT nasal spray Place 2 sprays into both nostrils daily.      Marland Kitchen lisinopril (PRINIVIL,ZESTRIL) 20 MG tablet Take 1 tablet (20 mg total) by mouth daily.  30 tablet  6  . LORazepam (ATIVAN) 0.5 MG tablet  Take 1 tablet (0.5 mg total) by mouth every 8 (eight) hours as needed for anxiety.  90 tablet  0  . metoprolol succinate (TOPROL-XL) 25 MG 24 hr tablet TAKE ONE-HALF TABLET DAILY  15 tablet  5  . Multiple Vitamin (MULTIVITAMIN) tablet Take 1 tablet by mouth daily.        . niacin 500 MG CR capsule Take 1,000 mg by mouth at bedtime.       . Omega-3 Fatty Acids (FISH OIL) 1000 MG CAPS Take 1,000 mg by mouth 2 (two) times daily.       . pantoprazole (PROTONIX) 40 MG tablet Take 1 tablet (40 mg total) by mouth 2 (two) times daily.  180 tablet  1  . vitamin C (ASCORBIC ACID) 500 MG tablet Take 500 mg by mouth daily.        . colesevelam (WELCHOL) 625 MG tablet Take 6 tablet daily for hypercholestrol      . escitalopram (LEXAPRO) 10 MG tablet Take 1 tablet (10 mg total) by mouth daily.  302 tablet  2  . [DISCONTINUED] sertraline (ZOLOFT) 25 MG tablet Take 1 tablet (25 mg total) by mouth daily.  30 tablet  2  . [DISCONTINUED] zolpidem (AMBIEN) 5 MG tablet Take 1 tablet (5 mg total) by mouth at bedtime as needed for sleep.  30 tablet  0   No current facility-administered medications on file prior to visit.    History   Social History  . Marital Status: Married    Spouse Name: N/A    Number of Children: N/A  . Years of Education: N/A   Occupational History  . Not on file.   Social History Main Topics  . Smoking status: Never Smoker   . Smokeless tobacco: Never Used  . Alcohol Use: No  . Drug Use: No  . Sexual Activity: No   Other Topics Concern  . Not on file   Social History Narrative  . No narrative on file   Family History  Problem Relation Age of Onset  . Heart attack Mother   . Heart failure Father   . Throat cancer Sister   . Heart disease Sister   . Coronary artery disease Sister   . Colon cancer Neg Hx   . Diabetes Sister    Physical exam BP 140/80  Pulse 52  Temp(Src) 98.1 F (36.7 C) (Oral)  Resp 18  Ht 5\' 7"  (1.702 m)  Wt 161 lb 12.8 oz (73.392 kg)  BMI  25.34 kg/m2  SpO2 98%  General- elderly female in no acute distress Head- atraumatic, normocephalic Eyes- PERRLA, EOMI, no pallor, no icterus, no discharge Ears- left ear normal tympanic membrane and normal external ear canal , right ear normal tympanic membrane and normal external ear canal Neck- no lymphadenopathy, no  thyromegaly, no jugular vein distension, no carotid bruit Nose- normal nasaal mucosa, no maxillary sinus tenderness, no frontal sinus tenderness Mouth- normal mucus membrane, no oral thrush, normal oropharynx Chest- no chest wall deformities, no chest wall tenderness Breast- refused, done by Gyn Cardiovascular- normal s1,s2, no murmurs/ rubs/ gallops, normal distal pulses Respiratory- bilateral clear to auscultation, no wheeze, no rhonchi, no crackles Abdomen- bowel sounds present, soft, non tender, no CVA tenderness Musculoskeletal- able to move all 4 extremities, no spinal and paraspinal tenderness, steady gait, no use of assistive device, normal range of motion, no leg edema Neurological- no focal deficit, normal reflexes, normal muscle strength, normal sensation to fine touch and vibration Skin- warm and dry Psychiatry- alert and oriented to person, place and time, tearful, flat affect, anxious  Labs- Lab Results  Component Value Date   WBC 5.1 09/24/2013   HGB 12.4 09/24/2013   HCT 37.4 09/24/2013   MCV 90 09/24/2013   PLT 209.0 03/29/2013   CMP     Component Value Date/Time   NA 139 09/24/2013 0835   NA 139 03/29/2013 1219   K 4.4 09/24/2013 0835   CL 100 09/24/2013 0835   CO2 26 09/24/2013 0835   GLUCOSE 90 09/24/2013 0835   GLUCOSE 90 03/29/2013 1219   BUN 13 09/24/2013 0835   BUN 13 03/29/2013 1219   CREATININE 0.71 09/24/2013 0835   CALCIUM 9.3 09/24/2013 0835   PROT 6.5 09/24/2013 0835   PROT 6.7 12/29/2012 0854   ALBUMIN 3.6 12/29/2012 0854   AST 18 09/24/2013 0835   ALT 16 09/24/2013 0835   ALKPHOS 72 09/24/2013 0835   BILITOT 0.4 09/24/2013 0835   GFRNONAA 78  09/24/2013 0835   GFRAA 90 09/24/2013 0835   Lipid Panel     Component Value Date/Time   CHOL 174 12/16/2012 1208   TRIG 74 09/24/2013 0835   HDL 56 09/24/2013 0835   HDL 47.90 12/16/2012 1208   CHOLHDL 3.1 09/24/2013 0835   CHOLHDL 4 12/16/2012 1208   VLDL 28.4 12/16/2012 1208   LDLCALC 101* 09/24/2013 0835   LDLCALC 98 12/16/2012 1208   Lab Results  Component Value Date   TSH 3.150 09/24/2013   09/29/13 MMSE 28/30, failed clock draw  09/29/13 PHQ9- scored 10  03/11/13- ekg- sinus bradycardia, rest is normal   Assessment/plan  1. Atrial fibrillation Rate controlled. Continue toprol xl 25 mg daily for rate control, amiodarone 100 mg daily and eliquis 5 mg bid  2. GERD Continue protonix once a day  3. Depression with anxiety Persists, worsened since last visit. Stopped lexapro by herself. Will start her on viibryd 10 mg daily for a week and then 20 mg daily for a week following that, sample provided. Reassess mood in 2 weeks. Also to take prn ativan for her nerves  4. HYPERLIPIDEMIA Reviewed lipid panel. Continue niacin and omega-3  5. Osteoarthritis Continue calcium-vitamin d - DG Bone Density; Future  6. Routine general medical examination at a health care facility the patient was counseled regarding the appropriate use of alcohol, regular self-examination of the breasts on a monthly basis, prevention of dental and periodontal disease, diet, regular sustained exercise for at least 30 minutes 5 times per week, routine screening interval for mammogram as recommended by the [place] and ACOG, the proper use of sunscreen and protective clothing and recommended cholesterol, thyroid and diabetes screening. Give prevnar today. Script for shingles vaccine provided. dexa scan ordered. uptodate with breast and pelvic exam. Labs reviewed  7. Postmenopausal bone loss Continue calcium and vitamin d - DG Bone Density; Future  8. Insomnia Will start her on ambien 5 mg daily and  reassess in 2 weeks

## 2013-09-29 NOTE — Progress Notes (Signed)
Patient did not pass the clock test. 

## 2013-09-30 ENCOUNTER — Telehealth: Payer: Self-pay | Admitting: *Deleted

## 2013-09-30 NOTE — Telephone Encounter (Signed)
Patient saw you yesterday and was started on a new depression medication called Viibryd and she took it at [facility] and it caused severe diarrhea for 2-3 hours. Wants to know what she should do? Please advise.

## 2013-10-01 ENCOUNTER — Ambulatory Visit (INDEPENDENT_AMBULATORY_CARE_PROVIDER_SITE_OTHER): Payer: Medicare Other | Admitting: Cardiology

## 2013-10-01 ENCOUNTER — Encounter: Payer: Self-pay | Admitting: Cardiology

## 2013-10-01 ENCOUNTER — Encounter: Payer: Self-pay | Admitting: Internal Medicine

## 2013-10-01 VITALS — BP 168/66 | HR 56 | Ht 67.0 in | Wt 159.0 lb

## 2013-10-01 DIAGNOSIS — E785 Hyperlipidemia, unspecified: Secondary | ICD-10-CM

## 2013-10-01 DIAGNOSIS — I2510 Atherosclerotic heart disease of native coronary artery without angina pectoris: Secondary | ICD-10-CM | POA: Diagnosis not present

## 2013-10-01 DIAGNOSIS — I10 Essential (primary) hypertension: Secondary | ICD-10-CM

## 2013-10-01 DIAGNOSIS — I480 Paroxysmal atrial fibrillation: Secondary | ICD-10-CM

## 2013-10-01 DIAGNOSIS — I4891 Unspecified atrial fibrillation: Secondary | ICD-10-CM | POA: Diagnosis not present

## 2013-10-01 MED ORDER — LISINOPRIL 20 MG PO TABS: 20.0000 mg | ORAL_TABLET | Freq: Two times a day (BID) | ORAL | Status: DC

## 2013-10-01 NOTE — Telephone Encounter (Signed)
Patient Notified and will keep trying it

## 2013-10-01 NOTE — Patient Instructions (Addendum)
Your physician has recommended you make the following change in your medication:  1) INCREASE Lisinopril to 20mg  twice daily. An Rx has been sent to your pharmacy   Take all other medications as prescribed  Your physician recommends that you return for lab work in: 2 weeks 10/15/13 between 7:30-5:15  Your physician wants you to follow-up in: 6 months You will receive a reminder letter in the mail two months in advance. If you don't receive a letter, please call our office to schedule the follow-up appointment.

## 2013-10-01 NOTE — Telephone Encounter (Signed)
Diarrhea is a common side effect with this medication that should improve with time. Try to take it after having your breakfast or lunch- this should help some. I would still recommend to give this a try for now

## 2013-10-03 NOTE — Progress Notes (Signed)
Patient ID: Marissa Bowen, female   DOB: 17-May-1926, 80 y.o.   MRN: [id number] PCP: Dr. Bubba Camp  80 yo with history of CAD s/p CABG in 1997 and paroxysmal atrial fibrillation presents for cardiology followup.  She was in the ER earlier this year with recurrent atrial fibrillation.  She converted back to NSR in the ER.  Atrial fibrillation was actually first noted back in 11/13. I started her on apixaban and dronedarone. Since that time, she seems to have held NSR.  She has had no tachypalpitations.  She fell into the doughnut hole and had to stop dronedarone due to cost.  I therefore switched her over the amiodarone.  She was on amlodipine for HTN but developed ankle swelling so is now on lisinopril instead.  She denies chest pain, exertional dyspnea, orthopnea, or lightheadedness.  BP is still running high.  No overt GI bleeding.  Her husband is chronically ill; this is a source of stress.   Labs (4/14): K 4, creatinine 0.61 Labs (7/14): K 4.8, creatinine 0.8 Labs (9/14): K 3.8, creatinine 0.8, TSH normal, LFTs normal Labs (5/15): K 4.4, creatinine 0.71, LFTs normal, TSH normal, LDL 101, HDL 56, HCT 37.4  ECG: NSR, nonspecific anterolateral ST-T changes  PMH: 1. CAD: s/p CABG 1997.  Echo (12/13) with EF 60-65%, no significant valvular abnormalities, normal RV size and systolic function.  2. Paroxysmal atrial fibrillation noted since 11/13.  Very symptomatic.  She, of note, has a history of TIA in 2008.  Was on dronedarone but stopped due to doughnut hole and now on amiodarone.  3. HTN: edema with amlodipine.  4. Hyperlipidemia: Unable to tolerate statins due to myalgias. 5. Migraines 6. GERD 7. IBS 8. Low back pain  SH: Married, lives in [place], nonsmoker.   FH: Father with MI  ROS: All systems reviewed and negative except as per HPI.   Current Outpatient Prescriptions  Medication Sig Dispense Refill  . acetaminophen (TYLENOL) 500 MG tablet Take 500 mg by mouth as needed.      Marland Kitchen  amiodarone (PACERONE) 200 MG tablet TAKE ONE-HALF TABLET DAILY  15 tablet  0  . calcium-vitamin D (OSCAL WITH D) 500-200 MG-UNIT per tablet Take 1 tablet by mouth daily.      . colesevelam (WELCHOL) 625 MG tablet Take 6 tablet daily for hypercholestrol      . ELIQUIS 5 MG TABS tablet TAKE ONE TABLET TWICE DAILY  60 tablet  0  . Flaxseed, Linseed, (FLAXSEED OIL) 1000 MG CAPS Take 1,000 mg by mouth at bedtime.       . fluticasone (FLONASE) 50 MCG/ACT nasal spray Place 2 sprays into both nostrils daily.      Marland Kitchen lisinopril (PRINIVIL,ZESTRIL) 20 MG tablet Take 1 tablet (20 mg total) by mouth 2 (two) times daily.  60 tablet  11  . LORazepam (ATIVAN) 0.5 MG tablet Take 1 tablet (0.5 mg total) by mouth every 8 (eight) hours as needed for anxiety.  90 tablet  0  . metoprolol succinate (TOPROL-XL) 25 MG 24 hr tablet TAKE ONE-HALF TABLET DAILY  15 tablet  5  . Multiple Vitamin (MULTIVITAMIN) tablet Take 1 tablet by mouth daily.        . niacin 500 MG CR capsule Take 1,000 mg by mouth at bedtime.       . Omega-3 Fatty Acids (FISH OIL) 1000 MG CAPS Take 1,000 mg by mouth 2 (two) times daily.       . pantoprazole (PROTONIX) 40 MG tablet  Take 1 tablet (40 mg total) by mouth 2 (two) times daily.  180 tablet  1  . Vilazodone HCl (VIIBRYD) 10 MG TABS Take 1 tablet (10 mg total) by mouth daily.  7 tablet  0  . vitamin C (ASCORBIC ACID) 500 MG tablet Take 500 mg by mouth daily.        Marland Kitchen zolpidem (AMBIEN) 5 MG tablet Take 1 tablet (5 mg total) by mouth at bedtime as needed for sleep.  30 tablet  1  . zoster vaccine live, PF, (ZOSTAVAX) 21308 UNT/0.65ML injection Inject 19,400 Units into the skin once.  1 each  0  . [DISCONTINUED] sertraline (ZOLOFT) 25 MG tablet Take 1 tablet (25 mg total) by mouth daily.  30 tablet  2   No current facility-administered medications for this visit.    BP 168/66  Pulse 56  Ht 5\' 7"  (1.702 m)  Wt 72.122 kg (159 lb)  BMI 24.90 kg/m2 General: NAD Neck: No JVD, no thyromegaly or  thyroid nodule.  Lungs: Clear to auscultation bilaterally with normal respiratory effort. CV: Nondisplaced PMI.  Heart regular S1/S2, no S3/S4, no murmur.  No peripheral edema.  No carotid bruit.  Normal pedal pulses.  Abdomen: Soft, nontender, no hepatosplenomegaly, no distention.  Neurologic: Alert and oriented x 3.  Psych: Normal affect. Extremities: No clubbing or cyanosis.   Assessment/Plan: 1. Atrial fibrillation: Paroxysmal.  She is very symptomatic when she goes into atrial fibrillation.  She is now holding NSR on amiodarone (had to stop dronedarone due to cost).   - Continue Toprol XL.  - Continue amiodarone at 100 mg daily.  LFTs/TSH normal in 5/15.  She knows to get a yearly eye exam.   - Continue Eliquis at 5 mg bid.  CBC recently looked ok.   2. CAD: Prior CABG.  No ischemic symptoms.  She cannot tolerate statins.  She is not on ASA because she is on Eliquis.  Continue Toprol XL.  3. Hyperlipidemia: On Welchol and Niacin because she does not tolerate statins.  4. HTN: BP running high.  Will increase lisinopril to 20 mg bid with BMET in 2 wks.   Larey Dresser 10/03/2013

## 2013-10-04 ENCOUNTER — Other Ambulatory Visit: Payer: Self-pay | Admitting: Cardiology

## 2013-10-04 NOTE — Telephone Encounter (Signed)
Patient called and stated that her symptoms are no better and now she is feeling fatigue. Patient wants to stop the medication and not take any or see if it can be changed to something else. Please Advise.

## 2013-10-04 NOTE — Telephone Encounter (Signed)
If she is having bad side effect from the viibryd, i would suggest her to stop it. Is her genetic testing result back? If yes, i can review that and call for another mood medication

## 2013-10-05 NOTE — Telephone Encounter (Signed)
Patient Notified and Genetic testing given to Dr. Bubba Camp

## 2013-10-08 ENCOUNTER — Telehealth: Payer: Self-pay | Admitting: Cardiology

## 2013-10-08 NOTE — Telephone Encounter (Signed)
New message    Patient calling was told by Dr. Aundra Dubin  To observe her bowel movement . Patient stating it's dark, tarry.

## 2013-10-08 NOTE — Telephone Encounter (Signed)
Pt states she had dark stools a couple of weeks ago and felt it may be related to a new medication she was taking for depression. She is no longer taking the medication. She noticed last night a very dark stool and again this AM but not as dark as last night. I reviewed with Dr Aundra Dubin --he recommended pt follow up with her PCP. Pt agreed with this plan.

## 2013-10-11 ENCOUNTER — Other Ambulatory Visit: Payer: Self-pay | Admitting: Internal Medicine

## 2013-10-11 DIAGNOSIS — N760 Acute vaginitis: Secondary | ICD-10-CM | POA: Diagnosis not present

## 2013-10-11 DIAGNOSIS — N8110 Cystocele, unspecified: Secondary | ICD-10-CM | POA: Diagnosis not present

## 2013-10-13 ENCOUNTER — Other Ambulatory Visit: Payer: Self-pay | Admitting: *Deleted

## 2013-10-13 ENCOUNTER — Ambulatory Visit: Payer: Medicare Other | Admitting: Internal Medicine

## 2013-10-13 MED ORDER — LORAZEPAM 0.5 MG PO TABS: ORAL_TABLET | ORAL | Status: DC

## 2013-10-15 ENCOUNTER — Other Ambulatory Visit: Payer: Medicare Other

## 2013-10-18 ENCOUNTER — Other Ambulatory Visit: Payer: Medicare Other

## 2013-10-25 ENCOUNTER — Other Ambulatory Visit (INDEPENDENT_AMBULATORY_CARE_PROVIDER_SITE_OTHER): Payer: Medicare Other

## 2013-10-25 DIAGNOSIS — I4891 Unspecified atrial fibrillation: Secondary | ICD-10-CM

## 2013-10-25 LAB — BASIC METABOLIC PANEL
BUN: 12 mg/dL (ref 6–23)
CALCIUM: 9 mg/dL (ref 8.4–10.5)
CO2: 30 mEq/L (ref 19–32)
Chloride: 102 mEq/L (ref 96–112)
Creatinine, Ser: 0.7 mg/dL (ref 0.4–1.2)
GFR: 85.94 mL/min (ref >60.00)
Glucose, Bld: 89 mg/dL (ref 70–99)
POTASSIUM: 3.9 meq/L (ref 3.5–5.1)
SODIUM: 140 meq/L (ref 135–145)

## 2013-10-26 ENCOUNTER — Encounter: Payer: Self-pay | Admitting: Internal Medicine

## 2013-10-26 ENCOUNTER — Ambulatory Visit (INDEPENDENT_AMBULATORY_CARE_PROVIDER_SITE_OTHER): Payer: Medicare Other | Admitting: Internal Medicine

## 2013-10-26 VITALS — BP 132/70 | HR 55 | Temp 98.0°F | Wt 158.0 lb

## 2013-10-26 DIAGNOSIS — K921 Melena: Secondary | ICD-10-CM | POA: Diagnosis not present

## 2013-10-26 DIAGNOSIS — M25531 Pain in right wrist: Secondary | ICD-10-CM

## 2013-10-26 DIAGNOSIS — F418 Other specified anxiety disorders: Secondary | ICD-10-CM

## 2013-10-26 DIAGNOSIS — I2510 Atherosclerotic heart disease of native coronary artery without angina pectoris: Secondary | ICD-10-CM

## 2013-10-26 DIAGNOSIS — M25539 Pain in unspecified wrist: Secondary | ICD-10-CM

## 2013-10-26 DIAGNOSIS — F341 Dysthymic disorder: Secondary | ICD-10-CM | POA: Diagnosis not present

## 2013-10-26 NOTE — Patient Instructions (Addendum)
You will need to see a psychiatrist. We have provided you with some contact information- please call and schedule an appointment.  Also please drop your stool card at the lab at your earliest convenience  Stop taking viibryd

## 2013-10-26 NOTE — Progress Notes (Signed)
Patient ID: Marissa Bowen, female   DOB: [birth year], 80 y.o.   MRN: [id number]    Facility: [facility]  Chief Complaint  Patient presents with  . Depression    Follow-up on anxiety/depression, no imporvement  . Arm Problem    right wrist pain  . GI Problem    nausea and black stool   HPI 80 y/o patient with history of depression and anxiety is here for follow up. She was started on viibryd last visit and mentions she has had no mood change with it. She feels sleepy and " not clear enough" and does not want to take it any more. Has been tried on citalopram and escitalopram in past. Also on ambien and lorazepam at present. Reviewed her genetic testing result and most of the commonly used antidepressants interact with her heart medications or she happens to be ultrarapid metabolizers for the remaining. She has noticed her right wrist to be hurting from yesterday. Denies remembering any trauma/ injury Feels stressed out with her home and husband's situation and has been looking into increased help from hospice team Has also been feeling nauseous in am when she gets up and has noticed black stools in the morning for last few days. Denies frank blood in stool. No vomiting. Has not skipped meals Taking PPI bid for now Not taking any NSAIDS  ROS No fever or chills No vomiting No abdominal pain No hematemesis Tearful this visit Has anxiety but does not want to go up on her anxiety medications Appetite is fair  Past Medical History  Diagnosis Date  . BACK PAIN, LUMBAR 09/27/2009  . CORONARY ARTERY DISEASE     a. s/p remote CABG 1997, 3V.  Marland Kitchen DIVERTICULITIS, HX OF 11/25/2006  . FATIGUE 07/21/2008  . GERD 11/25/2006  . HYPERLIPIDEMIA   . HYPERTENSION   . IBS 12/07/2008  . MIGRAINE WITH AURA 08/13/2007  . Muscle weakness (generalized) 09/27/2009  . OSTEOARTHRITIS 08/13/2007  . TRANSIENT ISCHEMIC ATTACK 04/13/2007  . Hiatal hernia   . Paroxysmal atrial fibrillation Dx  02/2012  . Anxiety   . CVA (cerebral infarction)     a. Thalamic infarct 09/2009.  Marland Kitchen Anemia   . GI bleed     a. 09/2009: secondary to antral ulcer.  . Mild carotid artery disease     a. 0-39% bilat 05/2011 (f/u recommended 05/2013).  . Gastric ulcer   . Heart attack   . Acute fibrinous pericarditis    Current Outpatient Prescriptions on File Prior to Visit  Medication Sig Dispense Refill  . acetaminophen (TYLENOL) 500 MG tablet Take 500 mg by mouth as needed.      Marland Kitchen amiodarone (PACERONE) 200 MG tablet TAKE ONE-HALF TABLET DAILY  15 tablet  6  . calcium-vitamin D (OSCAL WITH D) 500-200 MG-UNIT per tablet Take 1 tablet by mouth daily.      . colesevelam (WELCHOL) 625 MG tablet Take 6 tablet daily for hypercholestrol      . ELIQUIS 5 MG TABS tablet TAKE ONE TABLET TWICE DAILY  60 tablet  6  . Flaxseed, Linseed, (FLAXSEED OIL) 1000 MG CAPS Take 1,000 mg by mouth at bedtime.       . fluticasone (FLONASE) 50 MCG/ACT nasal spray Place 2 sprays into both nostrils daily.      Marland Kitchen lisinopril (PRINIVIL,ZESTRIL) 20 MG tablet Take 1 tablet (20 mg total) by mouth 2 (two) times daily.  60 tablet  11  . LORazepam (ATIVAN) 0.5 MG tablet Take  one tablet every eight hours as needed for anxiety  90 tablet  0  . metoprolol succinate (TOPROL-XL) 25 MG 24 hr tablet TAKE ONE-HALF TABLET DAILY  15 tablet  6  . Multiple Vitamin (MULTIVITAMIN) tablet Take 1 tablet by mouth daily.        . niacin 500 MG CR capsule Take 1,000 mg by mouth at bedtime.       . Omega-3 Fatty Acids (FISH OIL) 1000 MG CAPS Take 1,000 mg by mouth 2 (two) times daily.       . pantoprazole (PROTONIX) 40 MG tablet Take 1 tablet (40 mg total) by mouth 2 (two) times daily.  180 tablet  1  . vitamin C (ASCORBIC ACID) 500 MG tablet Take 500 mg by mouth daily.        Marland Kitchen zolpidem (AMBIEN) 5 MG tablet Take 1 tablet (5 mg total) by mouth at bedtime as needed for sleep.  30 tablet  1  . zoster vaccine live, PF, (ZOSTAVAX) 38329 UNT/0.65ML injection Inject  19,400 Units into the skin once.  1 each  0  . [DISCONTINUED] sertraline (ZOLOFT) 25 MG tablet Take 1 tablet (25 mg total) by mouth daily.  30 tablet  2   No current facility-administered medications on file prior to visit.    Physical exam BP 132/70  Pulse 55  Temp(Src) 98 F (36.7 C) (Oral)  Wt 158 lb (71.668 kg)  SpO2 97%  General- elderly female in no acute distress Head- atraumatic, normocephalic Eyes- PERRLA, EOMI, no pallor, no icterus, no discharge Neck- no lymphadenopathy, no thyromegaly, no jugular vein distension, no carotid bruit Nose- normal nasal mucosa, no maxillary sinus tenderness, no frontal sinus tenderness Mouth- normal mucus membrane Cardiovascular- normal s1,s2, no murmurs/ rubs/ gallops, normal distal pulses Respiratory- bilateral clear to auscultation, no wheeze, no rhonchi, no crackles Abdomen- bowel sounds present, soft, non tender, no CVA tenderness Musculoskeletal- able to move all 4 extremities, trace edema in right wrist joint on radial side, normal ROM but pain illicited Neurological- no focal deficit Skin- warm and dry Psychiatry- alert and oriented to person, place and time, anxious  Assessment/plan  1. Melena Has history of GERD. Will get FOBT checked. Will also get bc to recheck hb/hct with her being on eliquis as well. Not to take any NSAIDs for now - CBC with Differential  2. Right wrist pain Likely has some bursitis/ inflammation. This along with her OA could have worsened it. Will have her take tylenol extra strength bid for 5 days and to apply some icy hot bid for 5 days as well. Advised to apply ice pack and use her wrist brace at night time. Reassess if no improvement  3. Depression with anxiety Will discontinue her viibryd for now. Provided psychiatry referral. Continue her prn lorazepam

## 2013-10-27 LAB — CBC WITH DIFFERENTIAL/PLATELET
BASOS ABS: 0 10*3/uL (ref 0.0–0.2)
Basos: 1 %
EOS: 3 %
Eosinophils Absolute: 0.2 10*3/uL (ref 0.0–0.4)
HCT: 37.2 % (ref 34.0–46.6)
HEMOGLOBIN: 12.4 g/dL (ref 11.1–15.9)
Immature Grans (Abs): 0 10*3/uL (ref 0.0–0.1)
Immature Granulocytes: 0 %
Lymphocytes Absolute: 1.2 10*3/uL (ref 0.7–3.1)
Lymphs: 19 %
MCH: 29.7 pg (ref 26.6–33.0)
MCHC: 33.3 g/dL (ref 31.5–35.7)
MCV: 89 fL (ref 79–97)
MONOCYTES: 12 %
Monocytes Absolute: 0.8 10*3/uL (ref 0.1–0.9)
NEUTROS ABS: 4.3 10*3/uL (ref 1.4–7.0)
Neutrophils Relative %: 65 %
RBC: 4.17 x10E6/uL (ref 3.77–5.28)
RDW: 13.3 % (ref 12.3–15.4)
WBC: 6.6 10*3/uL (ref 3.4–10.8)

## 2013-10-30 ENCOUNTER — Encounter (HOSPITAL_COMMUNITY): Payer: Self-pay | Admitting: Emergency Medicine

## 2013-10-30 ENCOUNTER — Emergency Department (HOSPITAL_COMMUNITY)
Admission: EM | Admit: 2013-10-30 | Discharge: 2013-10-30 | Disposition: A | Discharge status: Home / Self Care | Payer: Medicare Other | Attending: Emergency Medicine | Admitting: Emergency Medicine

## 2013-10-30 ENCOUNTER — Emergency Department (HOSPITAL_COMMUNITY): Payer: Medicare Other

## 2013-10-30 DIAGNOSIS — Z862 Personal history of diseases of the blood and blood-forming organs and certain disorders involving the immune mechanism: Secondary | ICD-10-CM | POA: Diagnosis not present

## 2013-10-30 DIAGNOSIS — Z8639 Personal history of other endocrine, nutritional and metabolic disease: Secondary | ICD-10-CM | POA: Diagnosis not present

## 2013-10-30 DIAGNOSIS — Z79899 Other long term (current) drug therapy: Secondary | ICD-10-CM | POA: Diagnosis not present

## 2013-10-30 DIAGNOSIS — K259 Gastric ulcer, unspecified as acute or chronic, without hemorrhage or perforation: Secondary | ICD-10-CM | POA: Diagnosis not present

## 2013-10-30 DIAGNOSIS — IMO0002 Reserved for concepts with insufficient information to code with codable children: Secondary | ICD-10-CM | POA: Insufficient documentation

## 2013-10-30 DIAGNOSIS — Z8739 Personal history of other diseases of the musculoskeletal system and connective tissue: Secondary | ICD-10-CM | POA: Insufficient documentation

## 2013-10-30 DIAGNOSIS — I2510 Atherosclerotic heart disease of native coronary artery without angina pectoris: Secondary | ICD-10-CM | POA: Insufficient documentation

## 2013-10-30 DIAGNOSIS — I10 Essential (primary) hypertension: Secondary | ICD-10-CM | POA: Diagnosis not present

## 2013-10-30 DIAGNOSIS — Z8673 Personal history of transient ischemic attack (TIA), and cerebral infarction without residual deficits: Secondary | ICD-10-CM | POA: Insufficient documentation

## 2013-10-30 DIAGNOSIS — Z880 Allergy status to penicillin: Secondary | ICD-10-CM | POA: Diagnosis not present

## 2013-10-30 DIAGNOSIS — G43109 Migraine with aura, not intractable, without status migrainosus: Secondary | ICD-10-CM | POA: Insufficient documentation

## 2013-10-30 DIAGNOSIS — F32A Depression, unspecified: Secondary | ICD-10-CM

## 2013-10-30 DIAGNOSIS — I219 Acute myocardial infarction, unspecified: Secondary | ICD-10-CM | POA: Insufficient documentation

## 2013-10-30 DIAGNOSIS — J984 Other disorders of lung: Secondary | ICD-10-CM | POA: Diagnosis not present

## 2013-10-30 DIAGNOSIS — K219 Gastro-esophageal reflux disease without esophagitis: Secondary | ICD-10-CM | POA: Diagnosis not present

## 2013-10-30 DIAGNOSIS — F3289 Other specified depressive episodes: Secondary | ICD-10-CM | POA: Diagnosis not present

## 2013-10-30 DIAGNOSIS — D649 Anemia, unspecified: Secondary | ICD-10-CM | POA: Insufficient documentation

## 2013-10-30 DIAGNOSIS — F329 Major depressive disorder, single episode, unspecified: Secondary | ICD-10-CM | POA: Insufficient documentation

## 2013-10-30 DIAGNOSIS — R079 Chest pain, unspecified: Secondary | ICD-10-CM

## 2013-10-30 DIAGNOSIS — F411 Generalized anxiety disorder: Secondary | ICD-10-CM | POA: Insufficient documentation

## 2013-10-30 DIAGNOSIS — Z7901 Long term (current) use of anticoagulants: Secondary | ICD-10-CM | POA: Diagnosis not present

## 2013-10-30 DIAGNOSIS — F419 Anxiety disorder, unspecified: Secondary | ICD-10-CM

## 2013-10-30 DIAGNOSIS — F341 Dysthymic disorder: Secondary | ICD-10-CM | POA: Diagnosis not present

## 2013-10-30 LAB — BASIC METABOLIC PANEL
Anion gap: 13 (ref 5–15)
BUN: 11 mg/dL (ref 6–23)
CO2: 27 mEq/L (ref 19–32)
Calcium: 9.3 mg/dL (ref 8.4–10.5)
Chloride: 102 mEq/L (ref 96–112)
Creatinine, Ser: 0.61 mg/dL (ref 0.50–1.10)
GFR calc non Af Amer: 80 mL/min — ABNORMAL LOW (ref >90)
Glucose, Bld: 114 mg/dL — ABNORMAL HIGH (ref 70–99)
Potassium: 3.9 mEq/L (ref 3.7–5.3)
Sodium: 142 mEq/L (ref 137–147)

## 2013-10-30 LAB — CBC WITH DIFFERENTIAL/PLATELET
BASOS ABS: 0 10*3/uL (ref 0.0–0.1)
Basophils Relative: 1 % (ref 0–1)
EOS ABS: 0.1 10*3/uL (ref 0.0–0.7)
Eosinophils Relative: 2 % (ref 0–5)
HCT: 37.9 % (ref 36.0–46.0)
HEMOGLOBIN: 12.2 g/dL (ref 12.0–15.0)
Lymphocytes Relative: 17 % (ref 12–46)
Lymphs Abs: 1 10*3/uL (ref 0.7–4.0)
MCH: 29.5 pg (ref 26.0–34.0)
MCHC: 32.2 g/dL (ref 30.0–36.0)
MCV: 91.8 fL (ref 78.0–100.0)
MONOS PCT: 11 % (ref 3–12)
Monocytes Absolute: 0.7 10*3/uL (ref 0.1–1.0)
NEUTROS PCT: 69 % (ref 43–77)
Neutro Abs: 4.3 10*3/uL (ref 1.7–7.7)
PLATELETS: 201 10*3/uL (ref 150–400)
RBC: 4.13 MIL/uL (ref 3.87–5.11)
RDW: 13.7 % (ref 11.5–15.5)
WBC: 6.1 10*3/uL (ref 4.0–10.5)

## 2013-10-30 LAB — I-STAT TROPONIN, ED
TROPONIN I, POC: 0 ng/mL (ref 0.00–0.08)
Troponin i, poc: 0 ng/mL (ref 0.00–0.08)
Troponin i, poc: 0.01 ng/mL (ref 0.00–0.08)

## 2013-10-30 LAB — POC OCCULT BLOOD, ED: Fecal Occult Bld: NEGATIVE

## 2013-10-30 MED ORDER — LORAZEPAM 1 MG PO TABS
0.5000 mg | ORAL_TABLET | Freq: Once | ORAL | Status: AC
  Administered 2013-10-30: 0.5 mg via ORAL
  Filled 2013-10-30: qty 1

## 2013-10-30 NOTE — ED Provider Notes (Signed)
CSN: [id number]     Arrival date & time 10/30/13  1101 History   First MD Initiated Contact with Patient 10/30/13 1159     Chief Complaint  Patient presents with  . Chest Pain     (Consider location/radiation/quality/duration/timing/severity/associated sxs/prior Treatment) HPI 80 year old female with history of anxiety and depression as well as atrial fibrillation and coronary artery disease presents with worsening anxiety and depression over the last several days and last night had an episode that lasted about 30 minutes of some palpitations and felt as if her heart was skipping beats followed by a couple hours or less of some mild chest pressure sensation, she then was able to sleep all night but this morning after she woke up she then developed some recurrent mild chest pressure without palpitations of the last couple hours without radiation or associated symptoms such as nausea cough fever shortness of breath or other concerns, she has noticed some dark stools over the last several days he has no abdominal pain no vomiting no diarrhea no grossly bloody stools or black stools. She does take Eliquis due to her history of atrial fibrillation. she admits she has a lot of stress anxiety and depression but denies suicidal or homicidal ideation denies hallucinations. She has not seen a psychiatrist yet but is willing to do so in followup.  Past Medical History  Diagnosis Date  . BACK PAIN, LUMBAR 09/27/2009  . CORONARY ARTERY DISEASE     a. s/p remote CABG 1997, 3V.  Marland Kitchen DIVERTICULITIS, HX OF 11/25/2006  . FATIGUE 07/21/2008  . GERD 11/25/2006  . HYPERLIPIDEMIA   . HYPERTENSION   . IBS 12/07/2008  . MIGRAINE WITH AURA 08/13/2007  . Muscle weakness (generalized) 09/27/2009  . OSTEOARTHRITIS 08/13/2007  . TRANSIENT ISCHEMIC ATTACK 04/13/2007  . Hiatal hernia   . Paroxysmal atrial fibrillation Dx 02/2012  . Anxiety   . CVA (cerebral infarction)     a. Thalamic infarct 09/2009.  Marland Kitchen Anemia   . GI bleed      a. 09/2009: secondary to antral ulcer.  . Mild carotid artery disease     a. 0-39% bilat 05/2011 (f/u recommended 05/2013).  . Gastric ulcer   . Heart attack   . Acute fibrinous pericarditis    Past Surgical History  Procedure Laterality Date  . Tonsillectomy    . Abdominal hysterectomy  1973  . Coronary artery bypass graft  1997    Dr Melvenia Needles  . Coronary angioplasty  1997    Dr Lia Foyer  . Stroke  2013    Dr Lia Foyer   Family History  Problem Relation Age of Onset  . Heart attack Mother   . Heart failure Father   . Throat cancer Sister   . Heart disease Sister   . Coronary artery disease Sister   . Colon cancer Neg Hx   . Diabetes Sister    History  Substance Use Topics  . Smoking status: Never Smoker   . Smokeless tobacco: Never Used  . Alcohol Use: No   OB History   Grav Para Term Preterm Abortions TAB SAB Ect Mult Living                 Review of Systems  10 Systems reviewed and are negative for acute change except as noted in the HPI.  Allergies  Hydrocodone; Ibuprofen; Oxycodone hcl; Prednisone; Statins; and Penicillins  Home Medications   Prior to Admission medications   Medication Sig Start Date End Date Taking? Authorizing Provider  acetaminophen (TYLENOL) 500 MG tablet Take 1,000 mg by mouth every 6 (six) hours as needed (for pain).    Yes Historical Provider, MD  amiodarone (PACERONE) 200 MG tablet Take 100 mg by mouth daily.   Yes Historical Provider, MD  apixaban (ELIQUIS) 5 MG TABS tablet Take 5 mg by mouth 2 (two) times daily.   Yes Historical Provider, MD  calcium-vitamin D (OSCAL WITH D) 500-200 MG-UNIT per tablet Take 1 tablet by mouth daily.   Yes Historical Provider, MD  Flaxseed, Linseed, (FLAXSEED OIL) 1000 MG CAPS Take 1,000 mg by mouth at bedtime.    Yes Historical Provider, MD  fluticasone (FLONASE) 50 MCG/ACT nasal spray Place 2 sprays into both nostrils daily.   Yes Historical Provider, MD  lisinopril (PRINIVIL,ZESTRIL) 20 MG tablet Take  1 tablet (20 mg total) by mouth 2 (two) times daily. 10/01/13  Yes Larey Dresser, MD  LORazepam (ATIVAN) 0.5 MG tablet Take one tablet every eight hours as needed for anxiety 10/13/13  Yes Mahima Bubba Camp, MD  metoprolol succinate (TOPROL-XL) 25 MG 24 hr tablet TAKE ONE-HALF TABLET DAILY   Yes Larey Dresser, MD  Multiple Vitamin (MULTIVITAMIN) tablet Take 1 tablet by mouth daily.     Yes Historical Provider, MD  niacin 500 MG CR capsule Take 500 mg by mouth 2 (two) times daily.    Yes Historical Provider, MD  Omega-3 Fatty Acids (FISH OIL) 1000 MG CAPS Take 1,000 mg by mouth 2 (two) times daily.    Yes Historical Provider, MD  pantoprazole (PROTONIX) 40 MG tablet Take 1 tablet (40 mg total) by mouth 2 (two) times daily. 08/03/13  Yes Mahima Bubba Camp, MD  vitamin C (ASCORBIC ACID) 500 MG tablet Take 500 mg by mouth daily.     Yes Historical Provider, MD  zolpidem (AMBIEN) 5 MG tablet Take 1 tablet (5 mg total) by mouth at bedtime as needed for sleep. 09/29/13  Yes Mahima Bubba Camp, MD  zoster vaccine live, PF, (ZOSTAVAX) 88502 UNT/0.65ML injection Inject 19,400 Units into the skin once. 09/29/13   Mahima Pandey, MD   BP 183/71  Pulse 59  Temp(Src) 98.1 F (36.7 C)  Resp 18  SpO2 95% Physical Exam  Nursing note and vitals reviewed. Constitutional:  Awake, alert, nontoxic appearance.  HENT:  Head: Atraumatic.  Eyes: Right eye exhibits no discharge. Left eye exhibits no discharge.  Neck: Neck supple.  Cardiovascular: Normal rate and regular rhythm.   No murmur heard. Pulmonary/Chest: Effort normal and breath sounds normal. No respiratory distress. She has no wheezes. She has no rales. She exhibits no tenderness.  Abdominal: Soft. Bowel sounds are normal. She exhibits no distension and no mass. There is no tenderness. There is no rebound and no guarding.  Musculoskeletal: She exhibits no tenderness.  Baseline ROM, no obvious new focal weakness.  Neurological:  Mental status and motor strength appears  baseline for patient and situation.  Skin: No rash noted.  Psychiatric:  Anxious and depressed; denies suicidal or homicidal ideation denies hallucinations    ED Course  Procedures (including critical care time) Pt feels improved after observation and/or treatment in ED.Patient / Family / Caregiver informed of clinical course, understand medical decision-making process, and agree with plan. Anticipate discharge if/when third troponin result is negative. Hand-off to next shift EP. Labs Review Labs Reviewed  BASIC METABOLIC PANEL - Abnormal; Notable for the following:    Glucose, Bld 114 (*)    GFR calc non Af Amer 80 (*)  All other components within normal limits  CBC WITH DIFFERENTIAL  I-STAT TROPOININ, ED  POC OCCULT BLOOD, ED  Randolm Idol, ED  Randolm Idol, ED    Imaging Review Dg Chest Port 1 View  10/30/2013   CLINICAL DATA:  Chest pain on arrival.  EXAM: PORTABLE CHEST - 1 VIEW  COMPARISON:  01/12/2013.  FINDINGS: The cardiac silhouette remains mildly enlarged. Stable post CABG changes. Mild linear an ill-defined density at the left lung base without significant change. Otherwise, clear lungs with normal vascularity. Diffuse osteopenia.  IMPRESSION: Stable cardiomegaly and left basilar scarring. No acute abnormality.   Electronically Signed   By: Enrique Sack M.D.   On: 10/30/2013 12:34   Dg Chest Port 1 View  10/30/2013   CLINICAL DATA:  Chest pain on arrival.  EXAM: PORTABLE CHEST - 1 VIEW  COMPARISON:  01/12/2013.  FINDINGS: The cardiac silhouette remains mildly enlarged. Stable post CABG changes. Mild linear an ill-defined density at the left lung base without significant change. Otherwise, clear lungs with normal vascularity. Diffuse osteopenia.  IMPRESSION: Stable cardiomegaly and left basilar scarring. No acute abnormality.   Electronically Signed   By: Enrique Sack M.D.   On: 10/30/2013 12:34   EKG Interpretation   Date/Time:  Saturday October 30 2013 11:09:30  EDT Ventricular Rate:  58 PR Interval:  165 QRS Duration: 81 QT Interval:  487 QTC Calculation: 478 R Axis:   45 Text Interpretation:  Sinus rhythm Borderline repolarization abnormality  No significant change since last tracing Confirmed by [facility]  MD, Jenny Reichmann  [phone]) on 10/30/2013 12:01:33 PM      MDM   Final diagnoses:  Chest pain, unspecified chest pain type  Anxiety  Depression    Dispo pending; feel low risk for ACS.    Babette Relic, MD 10/30/13 [phone]

## 2013-10-30 NOTE — ED Notes (Signed)
poc complete and sent to lab.  Pt on bed pain

## 2013-10-30 NOTE — Discharge Instructions (Signed)
Your caregiver has diagnosed you as having chest pain that is not specific for one problem, but does not require admission.  You are at low risk for an acute heart condition or other serious illness. Chest pain comes from many different causes.  SEEK IMMEDIATE MEDICAL ATTENTION IF: You have severe chest pain, especially if the pain is crushing or pressure-like and spreads to the arms, back, neck, or jaw, or if you have sweating, nausea (feeling sick to your stomach), or shortness of breath. THIS IS AN EMERGENCY. Don't wait to see if the pain will go away. Get medical help at once. Call 911 or 0 (operator). DO NOT drive yourself to the hospital.  Your chest pain gets worse and does not go away with rest.  You have an attack of chest pain lasting longer than usual, despite rest and treatment with the medications your caregiver has prescribed.  You wake from sleep with chest pain or shortness of breath.  You feel dizzy or faint.  You have chest pain not typical of your usual pain for which you originally saw your caregiver.  You have signs of possible anxiety and/or depression. This is a very common problem.  Be sure to call your caregiver and arrange for follow-up care as suggested by our staff. RETURN IMMEDIATELY IF DEVELOP threat to harm self or others, suicidal or homicidal thoughts, hallucinations or confusion, unable to be cared for at home or uncontrolled behavior, or other concerns.     Behavioral Health Resources in the [facility]  Intensive Outpatient Programs: [facility]      [place]. [place], [place] Both a day and evening program       [facility] Outpatient     [address]        [employer] [place], [place] [address] [phone]         ADS: [facility] [place]: [phone] or [phone] [address] [place], [place]  [address] [email or web address]  Mobile Crisis Teams:                                        [facility]         Mobile Crisis Care Unit [phone]             [facility] [address] [place] [address] [place] [phone]  Self-Help/Support Groups: [employer] of support groups [phone] (call for more info)  Narcotics Anonymous (NA) Caring [facility] [address] [place] - 2 meetings at this location  Residential Treatment Programs:  [place]       [place] [address], [place], [place]  [address] [place]  [address] [place], [place] [address] [phone] Admissions: 8am-3pm M-F  Incentives Substance [place]  [address], [place] [address]       [phone]         [facility] [address] [place], [place]  [facility] [address] [place], [place]  [facility] - Intensive Outpatient      [address] [place] [address]     [place], [place]         [facility] ([place]     [place], [place] or [phone]  Residential Treatment Services (RTS)  [place], [place]  [facility] [address]                                               [place]  [facility] Resources: [phone]               General Therapy                                                Domenic Schwab, PhD        [address] [place], [place] [address]         [facility]   [address] [place], [place] [address] [phone]  [facility] [address] [place], [place] [address] [phone] Insurance/Medicaid/sponsorship through [facility] and Families                                              [address] [place]                                        [place], [place] [address]    Therapy/tele-psych/case         [place] [address], [place]  [address]  Adolescent/group home/case management [phone]                                           Rosette Reveal PhD       General therapy       Insurance   [phone]         Dr. Adele Schilder Insurance [phone] M-F  [place] [facility] Medicaid, sponsorship [phone]

## 2013-10-30 NOTE — ED Notes (Signed)
MD at bedside. 

## 2013-10-30 NOTE — ED Notes (Signed)
[facility] stated pt C/o nausea and pain in right mid chest since last nigh.  PT has hx of triple bipass in 97.  She is on blood thinner and refused aspirin.  She was 92% on RA and was give 2% o2.  Pt denies any pain.

## 2013-10-30 NOTE — ED Notes (Signed)
Pt given food and beverage per Dr Stevie Kern.  Dr Stevie Kern at bedside updating pt.

## 2013-10-30 NOTE — ED Notes (Signed)
Pt states she is depressed a lot of the time due to being the primary caregiver of her husband with last stage COPD.  She has a referral to see a psychiatrist and states she will f/u soon.

## 2013-10-31 NOTE — ED Provider Notes (Signed)
Delta trop negative. Pt d/c home with d/c instructions per Dr. Katy Apo note.   1. Chest pain, unspecified chest pain type   2. Anxiety   3. Depression      Neta Ehlers, MD 10/31/13 [phone]

## 2013-11-01 ENCOUNTER — Telehealth: Payer: Self-pay | Admitting: *Deleted

## 2013-11-01 NOTE — Telephone Encounter (Signed)
Spoke with patient regarding stool card, she stated that she is working on the stool card now and will return it this weekend.

## 2013-11-02 ENCOUNTER — Telehealth: Payer: Self-pay | Admitting: Cardiology

## 2013-11-02 NOTE — Telephone Encounter (Signed)
New problem   Pt was seen in ED and was told to f/u  In 5day. Pt want to speak to nurse she didn't want appt for end of July. Please call pt.

## 2013-11-02 NOTE — Telephone Encounter (Signed)
I spoke with pt & she states she is feeling fine today. No complaints of chest discomfort or angina. She agrees to appointment with Selinda Eon PA for 11/03/13  Reassurance given. Appointment made Horton Chin RN

## 2013-11-03 ENCOUNTER — Ambulatory Visit: Payer: Medicare Other | Admitting: Physician Assistant

## 2013-11-04 ENCOUNTER — Encounter: Payer: Self-pay | Admitting: Physician Assistant

## 2013-11-05 ENCOUNTER — Telehealth: Payer: Self-pay | Admitting: Cardiology

## 2013-11-05 NOTE — Telephone Encounter (Signed)
Called stating she was in ER on 7/4 for CP and high blood pressure.  States she is concerned that BP keeps varying.  Was told in ER that she did not have a heart attack but didn't say anything about BP.  Yesterday BP was 162/72; today at 9:30 156/69; at 11:45 171/65 and felt weak; now at 2:00 was 151/61.  States she feels better now. No SOB and HR has been 55-56.  Spoke w/DOD (Dr. Acie Fredrickson) who advises to watch BP over weekend and record readings as well as how she is feeling.  She has an app with Margaret Pyle on 7/17.  She was unable to keep app on 7/8 with Sharyn Lull because hospice nurse was there for her husband. Advised Dr. Aundra Dubin will be in office on Tues 7/14 and if still erratic to call back and speak w/Anne Lankford,RN. She understands and will record readings every day. Will call back if necessary.

## 2013-11-05 NOTE — Telephone Encounter (Signed)
New message     Pt states that her bp is 171/65 and HR 56.  She feels week. Please advise

## 2013-11-08 ENCOUNTER — Other Ambulatory Visit (INDEPENDENT_AMBULATORY_CARE_PROVIDER_SITE_OTHER): Payer: Medicare Other | Admitting: *Deleted

## 2013-11-08 DIAGNOSIS — Z1211 Encounter for screening for malignant neoplasm of colon: Secondary | ICD-10-CM

## 2013-11-08 LAB — POC HEMOCCULT BLD/STL (HOME/3-CARD/SCREEN)
Card #2 Fecal Occult Blod, POC: NEGATIVE
Card #3 Fecal Occult Blood, POC: NEGATIVE
FECAL OCCULT BLD: NEGATIVE

## 2013-11-09 ENCOUNTER — Telehealth: Payer: Self-pay | Admitting: Cardiology

## 2013-11-09 ENCOUNTER — Encounter: Payer: Self-pay | Admitting: *Deleted

## 2013-11-09 NOTE — Telephone Encounter (Signed)
Sounds like she is stable, can followup with Scott as planned.

## 2013-11-09 NOTE — Telephone Encounter (Signed)
New problem   Pt need to speak to you concerning her blood pressure. Please call pt

## 2013-11-09 NOTE — Telephone Encounter (Signed)
Pt advised.

## 2013-11-09 NOTE — Telephone Encounter (Signed)
Not at home. 

## 2013-11-09 NOTE — Telephone Encounter (Signed)
Pt was in ED Fourth of July because of uncomfortable feeling in her chest. No meds were changed and she was told everything checked out OK.   Pt currently under a lot of stress because of her husband's illness, he is under [place] and she is his primary caregiver. She associates some of her symptoms with increased stress.   Heart rate and BP readings starting 11/06/13:  47 156/69--55 143/63 53 146/70  45 167/69--51 145/59--51 145/59 53 148/63  Pt currently not symptomatic, including lightheadedness. She has an appt 11/12/13 with Richardson Dopp, PAc.   I will forward to Dr Aundra Dubin for review.

## 2013-11-12 ENCOUNTER — Encounter: Payer: Self-pay | Admitting: Physician Assistant

## 2013-11-12 ENCOUNTER — Ambulatory Visit (INDEPENDENT_AMBULATORY_CARE_PROVIDER_SITE_OTHER): Payer: Medicare Other | Admitting: Physician Assistant

## 2013-11-12 VITALS — BP 124/56 | HR 57 | Ht 67.0 in | Wt 156.0 lb

## 2013-11-12 DIAGNOSIS — I2510 Atherosclerotic heart disease of native coronary artery without angina pectoris: Secondary | ICD-10-CM

## 2013-11-12 DIAGNOSIS — I480 Paroxysmal atrial fibrillation: Secondary | ICD-10-CM

## 2013-11-12 DIAGNOSIS — I4891 Unspecified atrial fibrillation: Secondary | ICD-10-CM | POA: Diagnosis not present

## 2013-11-12 DIAGNOSIS — R079 Chest pain, unspecified: Secondary | ICD-10-CM | POA: Diagnosis not present

## 2013-11-12 DIAGNOSIS — I10 Essential (primary) hypertension: Secondary | ICD-10-CM

## 2013-11-12 DIAGNOSIS — E785 Hyperlipidemia, unspecified: Secondary | ICD-10-CM

## 2013-11-12 MED ORDER — LOSARTAN POTASSIUM 50 MG PO TABS: 50.0000 mg | ORAL_TABLET | Freq: Every day | ORAL | Status: DC

## 2013-11-12 NOTE — Progress Notes (Signed)
Cardiology Office Note    Date:  11/12/2013   ID:  Marissa Bowen, DOB 1926/08/24, MRN [id number]  PCP:  Blanchie Serve, MD  Cardiologist:  Dr. Loralie Champagne      History of Present Illness: Marissa Bowen is a 80 y.o. female with a history of CAD s/p CABG in 1997 and paroxysmal atrial fibrillation. She was in the ER earlier this year with recurrent atrial fibrillation. She converted back to NSR in the ER. Atrial fibrillation was actually first noted back in 11/13. She has been on apixaban and dronedarone. Since that time, she seems to have held NSR. She has had no tachypalpitations. She fell into the doughnut hole and had to stop dronedarone due to cost. Dr. Loralie Champagne switched her over the amiodarone. She was on amlodipine for HTN but developed ankle swelling so is now on lisinopril instead.   She was seen in emergency room 10/30/13 with chest pain.  Chest x-ray demonstrated no significant abnormality.  Cardiac enzymes remained normal.  She returns for follow up.    Her husband (of 13 years) was recently placed on Hospice for severe COPD.  She is his primary caregiver.  She started to notice chest pressure the day after her husband saw her PCP and they received the news.  She denies exertional chest pain.  She denies significant dyspnea.  She denies orthopnea, PND, edema.  She denies syncope.     Studies:  - Echo (04/03/12):  Mild LVH, EF 60-65%, normal wall motion, grade 2 diastolic dysfunction, trivial AI, trivial MR, mild LAE, mild to moderate TR, PASP 37 mm Hg  - Nuclear (05/2000):  No ischemia  - Carotid US (06/2013):  Bilateral ICA 1-39%   Recent Labs: 09/24/2013: ALT 16; HDL Cholesterol by NMR 56; LDL (calc) 101*; TSH 3.150  10/30/2013: Creatinine 0.61; Hemoglobin 12.2; Potassium 3.9   Wt Readings from Last 3 Encounters:  11/12/13 156 lb (70.761 kg)  10/26/13 158 lb (71.668 kg)  10/01/13 159 lb (72.122 kg)     PMH: 1. CAD: s/p CABG 1997. Echo (12/13) with EF 60-65%, no  significant valvular abnormalities, normal RV size and systolic function.  2. Paroxysmal atrial fibrillation noted since 11/13. Very symptomatic. She, of note, has a history of TIA in 2008. Was on dronedarone but stopped due to doughnut hole and now on amiodarone.  3. HTN: edema with amlodipine.  4. Hyperlipidemia: Unable to tolerate statins due to myalgias.  5. Migraines  6. GERD  7. IBS  8. Low back pain  Past Medical History  Diagnosis Date  . BACK PAIN, LUMBAR 09/27/2009  . CORONARY ARTERY DISEASE     a. s/p remote CABG 1997, 3V.  Marland Kitchen DIVERTICULITIS, HX OF 11/25/2006  . FATIGUE 07/21/2008  . GERD 11/25/2006  . HYPERLIPIDEMIA   . HYPERTENSION   . IBS 12/07/2008  . MIGRAINE WITH AURA 08/13/2007  . Muscle weakness (generalized) 09/27/2009  . OSTEOARTHRITIS 08/13/2007  . TRANSIENT ISCHEMIC ATTACK 04/13/2007  . Hiatal hernia   . Paroxysmal atrial fibrillation Dx 02/2012  . Anxiety   . CVA (cerebral infarction)     a. Thalamic infarct 09/2009.  Marland Kitchen Anemia   . GI bleed     a. 09/2009: secondary to antral ulcer.  . Mild carotid artery disease     a. 0-39% bilat 05/2011 (f/u recommended 05/2013).  . Gastric ulcer   . Heart attack   . Acute fibrinous pericarditis     Current Outpatient Prescriptions  Medication Sig  Dispense Refill  . acetaminophen (TYLENOL) 500 MG tablet Take 1,000 mg by mouth every 6 (six) hours as needed (for pain).       Marland Kitchen amiodarone (PACERONE) 200 MG tablet Take 100 mg by mouth daily.      Marland Kitchen apixaban (ELIQUIS) 5 MG TABS tablet Take 5 mg by mouth 2 (two) times daily.      . calcium-vitamin D (OSCAL WITH D) 500-200 MG-UNIT per tablet Take 1 tablet by mouth daily.      . Flaxseed, Linseed, (FLAXSEED OIL) 1000 MG CAPS Take 1,000 mg by mouth at bedtime.       . fluticasone (FLONASE) 50 MCG/ACT nasal spray Place 2 sprays into both nostrils daily.      Marland Kitchen lisinopril (PRINIVIL,ZESTRIL) 20 MG tablet Take 1 tablet (20 mg total) by mouth 2 (two) times daily.  60 tablet  11  .  LORazepam (ATIVAN) 0.5 MG tablet Take one tablet every eight hours as needed for anxiety  90 tablet  0  . metoprolol succinate (TOPROL-XL) 25 MG 24 hr tablet TAKE ONE-HALF TABLET DAILY  15 tablet  6  . Multiple Vitamin (MULTIVITAMIN) tablet Take 1 tablet by mouth daily.        . niacin 500 MG CR capsule Take 500 mg by mouth 2 (two) times daily.       . Omega-3 Fatty Acids (FISH OIL) 1000 MG CAPS Take 1,000 mg by mouth 2 (two) times daily.       . pantoprazole (PROTONIX) 40 MG tablet Take 1 tablet (40 mg total) by mouth 2 (two) times daily.  180 tablet  1  . vitamin C (ASCORBIC ACID) 500 MG tablet Take 500 mg by mouth daily.        Marland Kitchen zolpidem (AMBIEN) 5 MG tablet Take 1 tablet (5 mg total) by mouth at bedtime as needed for sleep.  30 tablet  1  . zoster vaccine live, PF, (ZOSTAVAX) 70350 UNT/0.65ML injection Inject 19,400 Units into the skin once.  1 each  0  . [DISCONTINUED] sertraline (ZOLOFT) 25 MG tablet Take 1 tablet (25 mg total) by mouth daily.  30 tablet  2   No current facility-administered medications for this visit.    Allergies:   Hydrocodone; Ibuprofen; Oxycodone hcl; Prednisone; Statins; and Penicillins   Social History:  The patient  reports that she has never smoked. She has never used smokeless tobacco. She reports that she does not drink alcohol or use illicit drugs.   Family History:  The patient's family history includes Coronary artery disease in her sister; Diabetes in her sister; Heart attack in her mother; Heart disease in her sister; Heart failure in her father; Throat cancer in her sister. There is no history of Colon cancer.   ROS:  Please see the history of present illness.   She has a NP cough.   All other systems reviewed and negative.   PHYSICAL EXAM: VS:  BP 124/56  Pulse 57  Ht 5\' 7"  (1.702 m)  Wt 156 lb (70.761 kg)  BMI 24.43 kg/m2 Well nourished, well developed, in no acute distress HEENT: normal Neck: no JVD Cardiac:  normal S1, S2; RRR; no  murmur Lungs:  clear to auscultation bilaterally, no wheezing, rhonchi or rales Abd: soft, nontender, no hepatomegaly Ext: no edema Skin: warm and dry Neuro:  CNs 2-12 intact, no focal abnormalities noted  EKG:  Sinus brady, HR 57, diff ST changes, no change from prior tracing     ASSESSMENT AND  PLAN:  1. Chest pain, unspecified chest pain type - Symptoms are mainly related to emotional stress. But, symptom features are somewhat concerning for angina.  Her last assessment for ischemia was in 2002.  I have recommended proceeding with Lexiscan Myoview to rule out ischemia.  She is under a great deal of stress with her husband's illness.  She will have to arrange a time to do her test and call us back to schedule. 2. CORONARY ARTERY DISEASE - Arrange Myoview as noted.  She is not on ASA as she is on Eliquis.  She cannot tolerate statins.  3. Paroxysmal atrial fibrillation - Maintaining NSR. She remains on Amiodarone. Recent LFTs and TSH ok. 4. HYPERTENSION - She has a NP cough.  Stop Lisinopril.  Start Losartan 50 mg BID.  Check BMET in 2-3 weeks.  5. HYPERLIPIDEMIA - She cannot tolerate statins. 6. Disposition - F/u with Dr. Loralie Champagne in 03/2014 or sooner if Myoview abnormal.    Signed, Versie Starks, MHS 11/12/2013 11:16 AM    [place], [place], [place]  [address] Phone: [phone]; Fax: [phone]

## 2013-11-12 NOTE — Patient Instructions (Addendum)
STOP LISINOPRIL  START LOSARTAN 50 MG 1 TABLET TWICE DAILY; NEW RX SENT IN TODAY TO [facility]  YOU WILL NEED LAB WORK IN 2 WEEKS 11/26/13 ANYTIME BETWEEN 7:30 AND 4:30; THIS IS DUE TO STARTING NEW MEDICATION  FOLLOW UP WITH DR. Aundra Dubin AS PLANNED  Your physician has requested that you have a lexiscan myoview. For further information please visit [email or web address]. Please follow instruction sheet, as given. PT WILL CALL BACK TO SCHEDULE

## 2013-11-18 ENCOUNTER — Telehealth: Payer: Self-pay | Admitting: *Deleted

## 2013-11-18 NOTE — Telephone Encounter (Signed)
Advise her to take her ambien 5 mg daily at night with her ativan 0.5 mg daily together. This might help her relax and have a sounder sleep. Let's try this for atleast a week and if this does not help, will adjust further

## 2013-11-18 NOTE — Telephone Encounter (Signed)
Patient states that the Ambien 5mg  is not working. She falls asleep good but she doesn't stay asleep and she wakes up wide awake. Please Advise. If patient is not home can leave message with husband per patient.

## 2013-11-18 NOTE — Telephone Encounter (Signed)
Patient Notified and agreed. 

## 2013-11-19 ENCOUNTER — Other Ambulatory Visit: Payer: Self-pay | Admitting: Internal Medicine

## 2013-11-22 DIAGNOSIS — N8189 Other female genital prolapse: Secondary | ICD-10-CM | POA: Diagnosis not present

## 2013-11-23 ENCOUNTER — Encounter: Payer: Self-pay | Admitting: Cardiology

## 2013-11-24 MED ORDER — ZOLPIDEM TARTRATE 10 MG PO TABS: 10.0000 mg | ORAL_TABLET | Freq: Every evening | ORAL | Status: DC | PRN

## 2013-11-24 NOTE — Telephone Encounter (Signed)
Can go up on her ambien to 10 mg daily. Also have pt see psychiatry- recommended a couple of times in the past.

## 2013-11-24 NOTE — Addendum Note (Signed)
Addended by: Rafael Bihari A on: 11/24/2013 03:31 PM   Modules accepted: Orders, Medications

## 2013-11-24 NOTE — Telephone Encounter (Signed)
Patient called back and stated that the Ambien 5mg  is not working. Has tried it with the Ativan and still no relief. 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 Please Advise.

## 2013-11-24 NOTE — Telephone Encounter (Signed)
Patient Notified and called in medication into pharmacy. Patient stated that she is getting Psychiatry from Hospice.

## 2013-11-25 ENCOUNTER — Encounter: Payer: Self-pay | Admitting: Cardiology

## 2013-11-26 ENCOUNTER — Other Ambulatory Visit (INDEPENDENT_AMBULATORY_CARE_PROVIDER_SITE_OTHER): Payer: Medicare Other

## 2013-11-26 ENCOUNTER — Telehealth: Payer: Self-pay | Admitting: *Deleted

## 2013-11-26 DIAGNOSIS — I10 Essential (primary) hypertension: Secondary | ICD-10-CM | POA: Diagnosis not present

## 2013-11-26 LAB — BASIC METABOLIC PANEL
BUN: 16 mg/dL (ref 6–23)
CO2: 30 mEq/L (ref 19–32)
CREATININE: 0.7 mg/dL (ref 0.4–1.2)
Calcium: 9.2 mg/dL (ref 8.4–10.5)
Chloride: 103 mEq/L (ref 96–112)
GFR: 80.52 mL/min (ref >60.00)
Glucose, Bld: 94 mg/dL (ref 70–99)
Potassium: 4.3 mEq/L (ref 3.5–5.1)
Sodium: 140 mEq/L (ref 135–145)

## 2013-11-26 NOTE — Telephone Encounter (Signed)
s/w pt's husband who asked if I will please cb in a little bit to go over results with pt. I said no problem, I will cb later.

## 2013-11-26 NOTE — Telephone Encounter (Signed)
pt notified about lab results with verbal understanding  

## 2013-11-29 ENCOUNTER — Ambulatory Visit (HOSPITAL_COMMUNITY): Payer: Medicare Other | Attending: Physician Assistant | Admitting: Radiology

## 2013-11-29 VITALS — BP 145/74 | HR 47 | Ht 67.0 in | Wt 151.0 lb

## 2013-11-29 DIAGNOSIS — I10 Essential (primary) hypertension: Secondary | ICD-10-CM

## 2013-11-29 DIAGNOSIS — Z8249 Family history of ischemic heart disease and other diseases of the circulatory system: Secondary | ICD-10-CM | POA: Insufficient documentation

## 2013-11-29 DIAGNOSIS — I2510 Atherosclerotic heart disease of native coronary artery without angina pectoris: Secondary | ICD-10-CM | POA: Diagnosis not present

## 2013-11-29 DIAGNOSIS — Z9861 Coronary angioplasty status: Secondary | ICD-10-CM | POA: Insufficient documentation

## 2013-11-29 DIAGNOSIS — Z8673 Personal history of transient ischemic attack (TIA), and cerebral infarction without residual deficits: Secondary | ICD-10-CM | POA: Diagnosis not present

## 2013-11-29 DIAGNOSIS — R079 Chest pain, unspecified: Secondary | ICD-10-CM

## 2013-11-29 DIAGNOSIS — I4891 Unspecified atrial fibrillation: Secondary | ICD-10-CM | POA: Diagnosis not present

## 2013-11-29 DIAGNOSIS — I252 Old myocardial infarction: Secondary | ICD-10-CM | POA: Diagnosis not present

## 2013-11-29 DIAGNOSIS — I480 Paroxysmal atrial fibrillation: Secondary | ICD-10-CM

## 2013-11-29 DIAGNOSIS — Z951 Presence of aortocoronary bypass graft: Secondary | ICD-10-CM | POA: Diagnosis not present

## 2013-11-29 MED ORDER — TECHNETIUM TC 99M SESTAMIBI GENERIC - CARDIOLITE
10.0000 | Freq: Once | INTRAVENOUS | Status: AC | PRN
  Administered 2013-11-29: 10 via INTRAVENOUS

## 2013-11-29 MED ORDER — TECHNETIUM TC 99M SESTAMIBI GENERIC - CARDIOLITE
30.0000 | Freq: Once | INTRAVENOUS | Status: AC | PRN
  Administered 2013-11-29: 30 via INTRAVENOUS

## 2013-11-29 MED ORDER — REGADENOSON 0.4 MG/5ML IV SOLN
0.4000 mg | Freq: Once | INTRAVENOUS | Status: AC
  Administered 2013-11-29: 0.4 mg via INTRAVENOUS

## 2013-11-29 NOTE — Progress Notes (Addendum)
[facility] [address] [place], [place] [address] [phone]    Cardiology Nuclear Med Study  Marissa Bowen is a 80 y.o. female     MRN : [id number]     DOB: 01-Aug-1926  Procedure Date: 11/29/2013  Nuclear Med Background Indication for Stress Test:  Evaluation for Ischemia, Graft Patency, Stent Patency, and Patient seen in hospital on 10-30-2013 for Chest Pain, Enzymes negative History:  CAD, MI, Cath, CABG, Stent, PTCA, Afib (PAF), Echo 2013 EF 60-65%, MPI 2002 (normal) Cardiac Risk Factors: Carotid Disease, CVA,Strong Family History - CAD, Hypertension and Lipids  Symptoms:  Chest Pressure.  (last date of chest discomfort was 10/30/2013)   Nuclear Pre-Procedure Caffeine/Decaff Intake:  None> 12 hrs NPO After: 6:00pm   Lungs:  clear O2 Sat: 96% on room air. IV 0.9% NS with Angio Cath:  22g  IV Site: R Wrist x 1, tolerated well IV Started by:  Marissa Baltimore, RN  Chest Size (in):  36 Cup Size: B  Height: 5\' 7"  (1.702 m)  Weight:  151 lb (68.493 kg)  BMI:  Body mass index is 23.64 kg/(m^2). Tech Comments:  Patient took Losartan this am, and Toprol last night. Marissa Baltimore, RN.    Nuclear Med Study 1 or 2 day study: 1 day  Stress Test Type:  Treadmill/Lexiscan  Reading MD: N/A  Order Authorizing Provider:  Loralie Bowen, and Marissa Bowen, PAC.  Resting Radionuclide: Technetium 46m Sestamibi  Resting Radionuclide Dose: 11.0 mCi   Stress Radionuclide:  Technetium 7m Sestamibi  Stress Radionuclide Dose: 33.0 mCi           Stress Protocol Rest HR: 47 Stress HR: 92  Rest BP: 145/74 Stress BP: 154/99  Exercise Time (min): n/a METS: n/a           Dose of Adenosine (mg):  n/a Dose of Lexiscan: 0.4 mg  Dose of Atropine (mg): n/a Dose of Dobutamine: n/a mcg/kg/min (at max HR)  Stress Test Technologist: Marissa Bowen, BS-ES  Nuclear Technologist:  Marissa Bowen, CNMT     Rest Procedure:  Myocardial perfusion imaging was performed at rest 45  minutes following the intravenous administration of Technetium 28m Sestamibi. Rest ECG: NSR - Normal EKG  Stress Procedure:  The patient received IV Lexiscan 0.4 mg over 15-seconds with concurrent low level exercise and then Technetium 37m Sestamibi was injected at 30-seconds while the patient continued walking one more minute.  Quantitative spect images were obtained after a 45-minute delay.  During the infusion of Lexiscan the patient complained of SOB and fatigue.  These symptoms began to resolve in recovery.  Stress ECG: No significant change from baseline ECG  QPS Raw Data Images:  Normal; no motion artifact; normal heart/lung ratio. Stress Images:  Normal homogeneous uptake in all areas of the myocardium. Rest Images:  Normal homogeneous uptake in all areas of the myocardium. Subtraction (SDS):  No evidence of ischemia. Transient Ischemic Dilatation (Normal <1.22):  1.02 Lung/Heart Ratio (Normal <0.45):  0.28  Quantitative Gated Spect Images QGS EDV:  73 ml QGS ESV:  21 ml  Impression Exercise Capacity:  Lexiscan with low level exercise. BP Response:  Normal blood pressure response. Clinical Symptoms:  There is dyspnea. ECG Impression:  No significant ST segment change suggestive of ischemia. Comparison with Prior Nuclear Study: No images to compare  Overall Impression:  Normal stress nuclear study.  LV Ejection Fraction: 72%.  LV Wall Motion:  NL LV Function; NL Wall Motion  Marissa Bowen  Normal study, please inform patient.   Marissa Bowen 11/30/2013

## 2013-11-30 ENCOUNTER — Telehealth: Payer: Self-pay | Admitting: Cardiology

## 2013-11-30 NOTE — Telephone Encounter (Signed)
New message    Patient wants to discuss blood test that was drawn last week.

## 2013-11-30 NOTE — Telephone Encounter (Signed)
Spoke with patient about lab results from 11/26/13

## 2013-11-30 NOTE — Progress Notes (Signed)
Pt.notified

## 2013-12-01 ENCOUNTER — Encounter: Payer: Self-pay | Admitting: Physician Assistant

## 2013-12-13 ENCOUNTER — Telehealth: Payer: Self-pay | Admitting: Cardiology

## 2013-12-13 NOTE — Telephone Encounter (Signed)
New Message  Pt called for a sooner appt, states that she has an "uncomfortable feeling in her chest" pt insists it is not chest pain and would like sooner follow Up appt.. Made appt for 12/20/2013. Please call pt to discuss.

## 2013-12-13 NOTE — Telephone Encounter (Signed)
Pt states her chest pain symptoms are unchanged from office visit with [facility].

## 2013-12-13 NOTE — Telephone Encounter (Signed)
If it occurs right after going to bed, could be GERD.  Could try omeprazole 20 mg daily to see if this helps.

## 2013-12-13 NOTE — Telephone Encounter (Signed)
Pt had myoview 12/03/13 that was normal.  Pt states her chest pain varies depending on the stress she is under. It seems to occur right after she goes to bed. She has scheduled an appt with Dr Aundra Dubin 12/20/13.   I will forward to Dr Aundra Dubin for review.

## 2013-12-14 ENCOUNTER — Encounter: Payer: Self-pay | Admitting: *Deleted

## 2013-12-15 NOTE — Telephone Encounter (Signed)
Pt states she currently takes protonix bid regularly. She does note some gas on occasion and is working on getting that under better control.

## 2013-12-17 ENCOUNTER — Telehealth: Payer: Self-pay | Admitting: Cardiology

## 2013-12-17 NOTE — Telephone Encounter (Signed)
New problem:    Pt called and would like a call back about her medication please.

## 2013-12-17 NOTE — Telephone Encounter (Signed)
Advised pt not to take NSAIDS. She will discuss with Dr Aundra Dubin Monday.

## 2013-12-20 ENCOUNTER — Ambulatory Visit (INDEPENDENT_AMBULATORY_CARE_PROVIDER_SITE_OTHER): Payer: Medicare Other | Admitting: Cardiology

## 2013-12-20 ENCOUNTER — Encounter: Payer: Self-pay | Admitting: Cardiology

## 2013-12-20 VITALS — BP 128/70 | HR 66 | Ht 68.0 in | Wt 154.0 lb

## 2013-12-20 DIAGNOSIS — E785 Hyperlipidemia, unspecified: Secondary | ICD-10-CM | POA: Diagnosis not present

## 2013-12-20 DIAGNOSIS — I480 Paroxysmal atrial fibrillation: Secondary | ICD-10-CM

## 2013-12-20 DIAGNOSIS — I4891 Unspecified atrial fibrillation: Secondary | ICD-10-CM | POA: Diagnosis not present

## 2013-12-20 DIAGNOSIS — I2510 Atherosclerotic heart disease of native coronary artery without angina pectoris: Secondary | ICD-10-CM

## 2013-12-20 LAB — HEPATIC FUNCTION PANEL
ALBUMIN: 3.8 g/dL (ref 3.5–5.2)
ALT: 14 U/L (ref 0–35)
AST: 20 U/L (ref 0–37)
Alkaline Phosphatase: 57 U/L (ref 39–117)
Bilirubin, Direct: 0 mg/dL (ref 0.0–0.3)
Total Bilirubin: 0.5 mg/dL (ref 0.2–1.2)
Total Protein: 6.8 g/dL (ref 6.0–8.3)

## 2013-12-20 LAB — TSH: TSH: 1.11 u[IU]/mL (ref 0.35–4.50)

## 2013-12-20 NOTE — Patient Instructions (Signed)
Your physician recommends that you return for lab work today--Liver profile/TSH.  Your physician recommends that you schedule a follow-up appointment in: 3 months with Dr Aundra Dubin.

## 2013-12-21 NOTE — Progress Notes (Signed)
Patient ID: Marissa Bowen, female   DOB: [birth year], 80 y.o.   MRN: [id number] PCP: Dr. Bubba Camp  80 yo with history of CAD s/p CABG in 1997 and paroxysmal atrial fibrillation presents for cardiology followup.  She was in the ER earlier this year with recurrent atrial fibrillation.  She converted back to NSR in the ER.  Atrial fibrillation was actually first noted back in 11/13. I started her on apixaban and dronedarone. Since that time, she seems to have held NSR.  She has had no tachypalpitations.  She fell into the doughnut hole and had to stop dronedarone due to cost.  I therefore switched her over the amiodarone.  She was on amlodipine for HTN but developed ankle swelling so was put on lisinopril but developed cough and is now on losartan.    She has been under a lot of stress recently.  Husband is now a hospice patient for COPD.  She will get chest heaviness and a sensation of her heart beating hard (but not fast).  These have no pattern. She was having the chest heaviness and awareness of her HR today when she had her ECG, which showed normal sinus rhythm.  Given these symptoms, she had Lexiscan Cardiolite in 8/15 that showed no ischemia or infarction.  She does not have exertional symptoms.    Labs (4/14): K 4, creatinine 0.61 Labs (7/14): K 4.8, creatinine 0.8 Labs (9/14): K 3.8, creatinine 0.8, TSH normal, LFTs normal Labs (5/15): K 4.4, creatinine 0.71, LFTs normal, TSH normal, LDL 101, HDL 56, HCT 37.4  ECG: NSR, nonspecific lateral T wave flattening  PMH: 1. CAD: s/p CABG 1997.  Echo (12/13) with EF 60-65%, no significant valvular abnormalities, normal RV size and systolic function.  Lexiscan Cardiolite (8/15) with EF 72%, no ischemia or infarction.  2. Paroxysmal atrial fibrillation noted since 11/13.  Very symptomatic.  She, of note, has a history of TIA in 2008.  Was on dronedarone but stopped due to doughnut hole and now on amiodarone.  3. HTN: edema with amlodipine.  4.  Hyperlipidemia: Unable to tolerate statins due to myalgias. 5. Migraines 6. GERD 7. IBS 8. Low back pain  SH: Married, lives in [place], nonsmoker.   FH: Father with MI  ROS: All systems reviewed and negative except as per HPI.   Current Outpatient Prescriptions  Medication Sig Dispense Refill  . acetaminophen (TYLENOL) 500 MG tablet Take 1,000 mg by mouth every 6 (six) hours as needed (for pain).       Marland Kitchen amiodarone (PACERONE) 200 MG tablet Take 100 mg by mouth daily.      Marland Kitchen apixaban (ELIQUIS) 5 MG TABS tablet Take 5 mg by mouth 2 (two) times daily.      . Flaxseed, Linseed, (FLAXSEED OIL) 1000 MG CAPS Take 1,000 mg by mouth at bedtime.       . fluticasone (FLONASE) 50 MCG/ACT nasal spray Place 2 sprays into both nostrils daily.      Marland Kitchen LORazepam (ATIVAN) 0.5 MG tablet TAKE ONE TABLET EVERY EIGHT HOURS AS NEEDED FOR ANXIETY  90 tablet  0  . losartan (COZAAR) 50 MG tablet Take 50 mg by mouth 2 (two) times daily.      . metoprolol succinate (TOPROL-XL) 25 MG 24 hr tablet TAKE ONE-HALF TABLET DAILY (12.5mg )      . Multiple Vitamin (MULTIVITAMIN) tablet Take 1 tablet by mouth daily.        . niacin 500 MG CR capsule Take 500 mg by  mouth 2 (two) times daily.       . Omega-3 Fatty Acids (FISH OIL) 1000 MG CAPS Take 1,000 mg by mouth 2 (two) times daily.       . pantoprazole (PROTONIX) 40 MG tablet Take 1 tablet (40 mg total) by mouth 2 (two) times daily.  180 tablet  1  . vitamin C (ASCORBIC ACID) 500 MG tablet Take 500 mg by mouth daily.        Marland Kitchen zolpidem (AMBIEN) 10 MG tablet Take 1 tablet (10 mg total) by mouth at bedtime as needed for sleep.  30 tablet  1  . [DISCONTINUED] sertraline (ZOLOFT) 25 MG tablet Take 1 tablet (25 mg total) by mouth daily.  30 tablet  2   No current facility-administered medications for this visit.    BP 128/70  Pulse 66  Ht 5\' 8"  (1.727 m)  Wt 154 lb (69.854 kg)  BMI 23.42 kg/m2 General: NAD Neck: No JVD, no thyromegaly or thyroid nodule.  Lungs:  Clear to auscultation bilaterally with normal respiratory effort. CV: Nondisplaced PMI.  Heart regular S1/S2, no S3/S4, no murmur.  No peripheral edema.  No carotid bruit.  Normal pedal pulses.  Abdomen: Soft, nontender, no hepatosplenomegaly, no distention.  Neurologic: Alert and oriented x 3.  Psych: Normal affect. Extremities: No clubbing or cyanosis.   Assessment/Plan: 1. Atrial fibrillation: Paroxysmal.  She is very symptomatic when she goes into atrial fibrillation.  She is now holding NSR on amiodarone (had to stop dronedarone due to cost).  ECG today while she was having palpitations (sensation of heart beating hard) did not show atrial fibrillation.  - Continue Toprol XL.  - Continue amiodarone at 100 mg daily.  Check LFTs and TSH today.  She knows to get a yearly eye exam.   - Continue Eliquis at 5 mg bid.  CBC recently looked ok.   2. CAD: Prior CABG.  She cannot tolerate statins.  She is not on ASA because she is on Eliquis.  Continue Toprol XL.  She has had some atypical chest heaviness recently that I think is related to stress.  She had a normal Lexiscan Cardiolite.  3. Hyperlipidemia: On Welchol and Niacin because she does not tolerate statins.  4. HTN: BP controlled.   Loralie Champagne 12/21/2013

## 2013-12-23 ENCOUNTER — Other Ambulatory Visit: Payer: Self-pay | Admitting: Internal Medicine

## 2014-01-18 ENCOUNTER — Other Ambulatory Visit: Payer: Self-pay | Admitting: Internal Medicine

## 2014-01-20 ENCOUNTER — Telehealth: Payer: Self-pay | Admitting: Cardiology

## 2014-01-20 ENCOUNTER — Other Ambulatory Visit: Payer: Self-pay | Admitting: *Deleted

## 2014-01-20 MED ORDER — APIXABAN 5 MG PO TABS: 5.0000 mg | ORAL_TABLET | Freq: Two times a day (BID) | ORAL | Status: DC

## 2014-01-20 NOTE — Telephone Encounter (Signed)
Patient assistance form for Marissa Bowen completed by Dr Aundra Dubin, forwarded to HIM to be faxed to [phone].( phone [phone]).  Pt advised form completed.

## 2014-01-20 NOTE — Telephone Encounter (Signed)
Walk in pt form " Sealed Envelope" Dropped Off gave to Anne L 9.24.15/km

## 2014-01-26 ENCOUNTER — Ambulatory Visit: Payer: Medicare Other | Admitting: Internal Medicine

## 2014-02-03 ENCOUNTER — Encounter: Payer: Self-pay | Admitting: Cardiology

## 2014-02-11 ENCOUNTER — Other Ambulatory Visit: Payer: Self-pay

## 2014-02-17 ENCOUNTER — Other Ambulatory Visit: Payer: Self-pay | Admitting: Internal Medicine

## 2014-02-18 ENCOUNTER — Telehealth: Payer: Self-pay

## 2014-02-18 MED ORDER — ZOLPIDEM TARTRATE 10 MG PO TABS: ORAL_TABLET | ORAL | Status: DC

## 2014-02-18 NOTE — Telephone Encounter (Signed)
Rx called in 

## 2014-02-18 NOTE — Telephone Encounter (Signed)
Ok to refill 

## 2014-02-18 NOTE — Telephone Encounter (Signed)
Patient called indicating that the pharmacy told her we denied her Ambien Refill request. Patient's husband in in [place] at hospice and she really needs sleep medication   Rx's were printed yesterday Ambien and Lorazepam. RX's were given to NP Sherrie Mustache), Janett Billow marked though Ambien rx and indicated she was not comfortable singing rx for Ambien 10 mg for an geriatric patient. I will forward message to Dr.Pandey (patient's PCP)

## 2014-02-18 NOTE — Telephone Encounter (Signed)
Ok to provide refill

## 2014-03-07 ENCOUNTER — Other Ambulatory Visit: Payer: Self-pay | Admitting: Internal Medicine

## 2014-03-15 DIAGNOSIS — D72829 Elevated white blood cell count, unspecified: Secondary | ICD-10-CM | POA: Diagnosis not present

## 2014-03-15 DIAGNOSIS — N8189 Other female genital prolapse: Secondary | ICD-10-CM | POA: Diagnosis not present

## 2014-03-15 DIAGNOSIS — N390 Urinary tract infection, site not specified: Secondary | ICD-10-CM | POA: Diagnosis not present

## 2014-03-17 ENCOUNTER — Other Ambulatory Visit: Payer: Self-pay | Admitting: Internal Medicine

## 2014-03-22 ENCOUNTER — Ambulatory Visit: Payer: Medicare Other | Admitting: Internal Medicine

## 2014-03-30 ENCOUNTER — Other Ambulatory Visit: Payer: Self-pay | Admitting: *Deleted

## 2014-03-30 MED ORDER — LORAZEPAM 0.5 MG PO TABS: ORAL_TABLET | ORAL | Status: DC

## 2014-03-30 NOTE — Telephone Encounter (Signed)
[facility] 

## 2014-04-04 ENCOUNTER — Ambulatory Visit (INDEPENDENT_AMBULATORY_CARE_PROVIDER_SITE_OTHER): Payer: Medicare Other | Admitting: Cardiology

## 2014-04-04 ENCOUNTER — Encounter: Payer: Self-pay | Admitting: Cardiology

## 2014-04-04 VITALS — BP 170/74 | HR 51 | Ht 68.0 in | Wt 154.0 lb

## 2014-04-04 DIAGNOSIS — I480 Paroxysmal atrial fibrillation: Secondary | ICD-10-CM

## 2014-04-04 DIAGNOSIS — I2510 Atherosclerotic heart disease of native coronary artery without angina pectoris: Secondary | ICD-10-CM

## 2014-04-04 MED ORDER — APIXABAN 5 MG PO TABS: 5.0000 mg | ORAL_TABLET | Freq: Two times a day (BID) | ORAL | Status: DC

## 2014-04-04 NOTE — Patient Instructions (Signed)
Your physician recommends that you return for lab work --liver profile/BMET/cbcd/TSH.  Your physician wants you to follow-up in: 6 months with Dr Aundra Dubin. (June 2016).  You will receive a reminder letter in the mail two months in advance. If you don't receive a letter, please call our office to schedule the follow-up appointment.

## 2014-04-05 NOTE — Progress Notes (Signed)
Patient ID: Marissa Bowen, female   DOB: 04-25-1927, 80 y.o.   MRN: [id number] PCP: Dr. Bubba Camp  80 yo with history of CAD s/p CABG in 1997 and paroxysmal atrial fibrillation presents for cardiology followup.  She was in the ER earlier this year with recurrent atrial fibrillation.  She converted back to NSR in the ER.  Atrial fibrillation was actually first noted back in 11/13. I started her on apixaban and dronedarone. Since that time, she seems to have held NSR.  She has had no tachypalpitations.  She fell into the doughnut hole and had to stop dronedarone due to cost.  I therefore switched her over the amiodarone.  She was on amlodipine for HTN but developed ankle swelling so was put on lisinopril but developed cough and is now on losartan.  She had Lexiscan Cardiolite in 8/15 that showed no ischemia or infarction.    Her husband died in March 10, 2023, and she is under a lot of stress and feels understandably depressed. No tachypalpitations.  No chest pain or exertional dyspnea.  She gardens and does yardwork without problems.     Labs (4/14): K 4, creatinine 0.61 Labs (7/14): K 4.8, creatinine 0.8 Labs (9/14): K 3.8, creatinine 0.8, TSH normal, LFTs normal Labs (5/15): K 4.4, creatinine 0.71, LFTs normal, TSH normal, LDL 101, HDL 56, HCT 37.4 Labs (7/15): K 4.3, creatinine 0.7 Labs (8/15): TSH normal, LFTs normal  ECG: NSR, normal  PMH: 1. CAD: s/p CABG 1997.  Echo (12/13) with EF 60-65%, no significant valvular abnormalities, normal RV size and systolic function.  Lexiscan Cardiolite (8/15) with EF 72%, no ischemia or infarction.  2. Paroxysmal atrial fibrillation noted since 11/13.  Very symptomatic.  She, of note, has a history of TIA in 2008.  Was on dronedarone but stopped due to doughnut hole and now on amiodarone.  3. HTN: edema with amlodipine.  4. Hyperlipidemia: Unable to tolerate statins due to myalgias. 5. Migraines 6. GERD 7. IBS 8. Low back pain  SH: Married, lives in [place],  nonsmoker.   FH: Father with MI  ROS: All systems reviewed and negative except as per HPI.   Current Outpatient Prescriptions  Medication Sig Dispense Refill  . acetaminophen (TYLENOL) 500 MG tablet Take 1,000 mg by mouth every 6 (six) hours as needed (for pain).     Marland Kitchen amiodarone (PACERONE) 200 MG tablet Take 100 mg by mouth daily.    Marland Kitchen apixaban (ELIQUIS) 5 MG TABS tablet Take 1 tablet (5 mg total) by mouth 2 (two) times daily. 180 tablet 3  . Flaxseed, Linseed, (FLAXSEED OIL) 1000 MG CAPS Take 1,000 mg by mouth at bedtime.     . fluticasone (FLONASE) 50 MCG/ACT nasal spray Place 2 sprays into both nostrils daily.    Marland Kitchen LORazepam (ATIVAN) 0.5 MG tablet Take one tablet every eight hours as needed for anxiety 90 tablet 0  . losartan (COZAAR) 50 MG tablet Take 50 mg by mouth 2 (two) times daily.    . metoprolol succinate (TOPROL-XL) 25 MG 24 hr tablet take 1/4 tab qd  6.25mg     . Multiple Vitamin (MULTIVITAMIN) tablet Take 1 tablet by mouth daily.      . niacin 500 MG CR capsule Take 500 mg by mouth 2 (two) times daily.     . Omega-3 Fatty Acids (FISH OIL) 1000 MG CAPS Take 1,000 mg by mouth 2 (two) times daily.     . pantoprazole (PROTONIX) 40 MG tablet TAKE ONE TABLET TWICE DAILY  180 tablet 5  . vitamin C (ASCORBIC ACID) 500 MG tablet Take 500 mg by mouth daily.      Marland Kitchen zolpidem (AMBIEN) 10 MG tablet Take 1 tablet at bed time for rest. 30 tablet 0  . [DISCONTINUED] sertraline (ZOLOFT) 25 MG tablet Take 1 tablet (25 mg total) by mouth daily. 30 tablet 2   No current facility-administered medications for this visit.    BP 170/74 mmHg  Pulse 51  Ht 5\' 8"  (1.727 m)  Wt 154 lb (69.854 kg)  BMI 23.42 kg/m2 General: NAD Neck: No JVD, no thyromegaly or thyroid nodule.  Lungs: Clear to auscultation bilaterally with normal respiratory effort. CV: Nondisplaced PMI.  Heart regular S1/S2, no S3/S4, no murmur.  No peripheral edema.  No carotid bruit.  Normal pedal pulses.  Abdomen: Soft,  nontender, no hepatosplenomegaly, no distention.  Neurologic: Alert and oriented x 3.  Psych: Normal affect. Extremities: No clubbing or cyanosis.   Assessment/Plan: 1. Atrial fibrillation: Paroxysmal.  She is very symptomatic when she goes into atrial fibrillation.  She is now holding NSR on amiodarone (had to stop dronedarone due to cost).  No recent palpitations.  - Continue Toprol XL (now on a low dose with HR in 50s).  - Continue amiodarone at 100 mg daily.  Check LFTs and TSH today.  She knows to get a yearly eye exam.   - Continue Eliquis at 5 mg bid.  Check CBC today.  2. CAD: Prior CABG.  She cannot tolerate statins.  She is not on ASA because she is on Eliquis.  Continue Toprol XL.  She had a normal Lexiscan Cardiolite in 8/15.  3. Hyperlipidemia: On Welchol and Niacin because she does not tolerate statins.  4. HTN: BP elevated, will follow for now (she is under a lot of stress).  5. Situational depression: Related to husband's death.    Loralie Champagne Apr 23, 2014

## 2014-04-06 ENCOUNTER — Other Ambulatory Visit (INDEPENDENT_AMBULATORY_CARE_PROVIDER_SITE_OTHER): Payer: Medicare Other | Admitting: *Deleted

## 2014-04-06 ENCOUNTER — Telehealth: Payer: Self-pay | Admitting: *Deleted

## 2014-04-06 DIAGNOSIS — I480 Paroxysmal atrial fibrillation: Secondary | ICD-10-CM | POA: Diagnosis not present

## 2014-04-06 LAB — CBC WITH DIFFERENTIAL/PLATELET
Basophils Absolute: 0 10*3/uL (ref 0.0–0.1)
Basophils Relative: 0.3 % (ref 0.0–3.0)
EOS PCT: 2.8 % (ref 0.0–5.0)
Eosinophils Absolute: 0.2 10*3/uL (ref 0.0–0.7)
HEMATOCRIT: 36.8 % (ref 36.0–46.0)
HEMOGLOBIN: 12.3 g/dL (ref 12.0–15.0)
LYMPHS ABS: 1.4 10*3/uL (ref 0.7–4.0)
Lymphocytes Relative: 19.4 % (ref 12.0–46.0)
MCHC: 33.3 g/dL (ref 30.0–36.0)
MCV: 93 fl (ref 78.0–100.0)
MONO ABS: 0.5 10*3/uL (ref 0.1–1.0)
MONOS PCT: 6.5 % (ref 3.0–12.0)
NEUTROS ABS: 5.2 10*3/uL (ref 1.4–7.7)
Neutrophils Relative %: 71 % (ref 43.0–77.0)
PLATELETS: 240 10*3/uL (ref 150.0–400.0)
RBC: 3.96 Mil/uL (ref 3.87–5.11)
RDW: 14 % (ref 11.5–15.5)
WBC: 7.3 10*3/uL (ref 4.0–10.5)

## 2014-04-06 LAB — BASIC METABOLIC PANEL
BUN: 14 mg/dL (ref 6–23)
CHLORIDE: 99 meq/L (ref 96–112)
CO2: 30 mEq/L (ref 19–32)
CREATININE: 0.8 mg/dL (ref 0.4–1.2)
Calcium: 8.8 mg/dL (ref 8.4–10.5)
GFR: 75.64 mL/min (ref >60.00)
Glucose, Bld: 104 mg/dL — ABNORMAL HIGH (ref 70–99)
Potassium: 3.7 mEq/L (ref 3.5–5.1)
Sodium: 137 mEq/L (ref 135–145)

## 2014-04-06 LAB — HEPATIC FUNCTION PANEL
ALT: 28 U/L (ref 0–35)
AST: 23 U/L (ref 0–37)
Albumin: 3.5 g/dL (ref 3.5–5.2)
Alkaline Phosphatase: 72 U/L (ref 39–117)
BILIRUBIN DIRECT: 0.1 mg/dL (ref 0.0–0.3)
BILIRUBIN TOTAL: 0.8 mg/dL (ref 0.2–1.2)
Total Protein: 6.4 g/dL (ref 6.0–8.3)

## 2014-04-06 LAB — TSH: TSH: 1.82 u[IU]/mL (ref 0.35–4.50)

## 2014-04-06 NOTE — Telephone Encounter (Signed)
Provider section of [employer] form for Eliquis completed by Dr Aundra Dubin.  Pt advised this has been completed. Pt  requested completed form be mailed to [employer] address on form.

## 2014-04-11 ENCOUNTER — Ambulatory Visit (INDEPENDENT_AMBULATORY_CARE_PROVIDER_SITE_OTHER): Payer: Medicare Other

## 2014-04-11 ENCOUNTER — Encounter: Payer: Self-pay | Admitting: Nurse Practitioner

## 2014-04-11 ENCOUNTER — Ambulatory Visit (INDEPENDENT_AMBULATORY_CARE_PROVIDER_SITE_OTHER): Payer: Medicare Other | Admitting: Nurse Practitioner

## 2014-04-11 VITALS — BP 138/80 | HR 55 | Temp 98.6°F | Resp 10 | Ht 68.0 in | Wt 152.0 lb

## 2014-04-11 DIAGNOSIS — F418 Other specified anxiety disorders: Secondary | ICD-10-CM

## 2014-04-11 DIAGNOSIS — Z23 Encounter for immunization: Secondary | ICD-10-CM | POA: Diagnosis not present

## 2014-04-11 DIAGNOSIS — I2510 Atherosclerotic heart disease of native coronary artery without angina pectoris: Secondary | ICD-10-CM

## 2014-04-11 MED ORDER — LORAZEPAM 0.5 MG PO TABS: ORAL_TABLET | ORAL | Status: DC

## 2014-04-11 MED ORDER — METOPROLOL SUCCINATE ER 25 MG PO TB24: ORAL_TABLET | ORAL | Status: DC

## 2014-04-11 MED ORDER — ZOLPIDEM TARTRATE 10 MG PO TABS: ORAL_TABLET | ORAL | Status: DC

## 2014-04-11 MED ORDER — AMIODARONE HCL 200 MG PO TABS: 100.0000 mg | ORAL_TABLET | Freq: Every day | ORAL | Status: DC

## 2014-04-11 NOTE — Progress Notes (Signed)
Patient ID: Marissa Bowen, female   DOB: 1926-08-21, 80 y.o.   MRN: [id number]    PCP: Blanchie Serve, MD  Allergies  Allergen Reactions  . Hydrocodone     REACTION: migraines  . Ibuprofen Other (See Comments)    ulcers  . Oxycodone Hcl     REACTION: migraines  . Prednisone Other (See Comments)    ulcers  . Statins Other (See Comments)    Aches and pains  . Penicillins Swelling and Rash    Chief Complaint  Patient presents with  . Acute Visit    Depression- husband deceased 03-02-14     HPI: Patient is a 80 y.o. female seen in the office today due to depression. Feels like she needs a check up- reports this year has not been a good year.  Reports her husband was very sick and she was his caretaker for several months and he died back in 03-03-2023. Went to cardiologist last week. Had blood work- lab work reviewed with pt.  Went to hospice counselor and spoke with them. They are offering her more sessions. Does feel like she is benefiting from this.  After reviewing chart pt has tired multiple medications for anxiety and depression. Did not tolerate these medications. Psych referral was recommended and she reports she has not done this and will not do this.  Has been tired on viibryd, citalopram and escitalopram. Does not really want to try medications, does not know what to expect.  Her sister has been with her for a month, but is going home tomorrow.  Review of Systems:  Review of Systems  Constitutional: Negative for activity change, appetite change, fatigue and unexpected weight change.  HENT: Negative for congestion and hearing loss.   Eyes: Negative.   Respiratory: Negative for cough and shortness of breath.   Cardiovascular: Negative for chest pain, palpitations and leg swelling.  Gastrointestinal: Negative for abdominal pain, diarrhea and constipation.  Genitourinary: Negative for dysuria and difficulty urinating.       Completed treatment for UTI  Musculoskeletal:  Negative for myalgias and arthralgias.  Neurological: Negative for dizziness and weakness.  Psychiatric/Behavioral: Negative for suicidal ideas, behavioral problems, confusion, self-injury and agitation. The patient is nervous/anxious.     Past Medical History  Diagnosis Date  . BACK PAIN, LUMBAR 09/27/2009  . CORONARY ARTERY DISEASE     a. s/p remote CABG 1997, 3V.;  b. LexiScan Myoview (8/15): No ischemia, EF 72%, normal study  . DIVERTICULITIS, HX OF 11/25/2006  . FATIGUE 07/21/2008  . GERD 11/25/2006  . HYPERLIPIDEMIA   . HYPERTENSION   . IBS 12/07/2008  . MIGRAINE WITH AURA 08/13/2007  . Muscle weakness (generalized) 09/27/2009  . OSTEOARTHRITIS 08/13/2007  . TRANSIENT ISCHEMIC ATTACK 04/13/2007  . Hiatal hernia   . Paroxysmal atrial fibrillation Dx 03/02/2012  . Anxiety   . CVA (cerebral infarction)     a. Thalamic infarct 09/2009.  Marland Kitchen Anemia   . GI bleed     a. 09/2009: secondary to antral ulcer.  . Mild carotid artery disease     a. 0-39% bilat 05/2011 (f/u recommended 05/2013).  . Gastric ulcer   . Heart attack   . Acute fibrinous pericarditis    Past Surgical History  Procedure Laterality Date  . Tonsillectomy    . Abdominal hysterectomy  1973  . Coronary artery bypass graft  1997    Dr Melvenia Needles  . Coronary angioplasty  1997    Dr Lia Foyer   Social History:  reports that she has never smoked. She has never used smokeless tobacco. She reports that she does not drink alcohol or use illicit drugs.  Family History  Problem Relation Age of Onset  . Heart attack Mother   . Heart failure Father   . Throat cancer Sister   . Heart disease Sister   . Coronary artery disease Sister   . Colon cancer Neg Hx   . Diabetes Sister     Medications: Patient's Medications  New Prescriptions   No medications on file  Previous Medications   ACETAMINOPHEN (TYLENOL) 500 MG TABLET    Take 1,000 mg by mouth every 6 (six) hours as needed (for pain).    AMIODARONE (PACERONE) 200 MG TABLET     Take 100 mg by mouth daily.   APIXABAN (ELIQUIS) 5 MG TABS TABLET    Take 1 tablet (5 mg total) by mouth 2 (two) times daily.   FLAXSEED, LINSEED, (FLAXSEED OIL) 1000 MG CAPS    Take 1,000 mg by mouth at bedtime.    FLUTICASONE (FLONASE) 50 MCG/ACT NASAL SPRAY    Place 2 sprays into both nostrils daily.   LORAZEPAM (ATIVAN) 0.5 MG TABLET    Take one tablet every eight hours as needed for anxiety   LOSARTAN (COZAAR) 50 MG TABLET    Take 50 mg by mouth 2 (two) times daily.   METOPROLOL SUCCINATE (TOPROL-XL) 25 MG 24 HR TABLET    take 1/2 tab qd   MULTIPLE VITAMIN (MULTIVITAMIN) TABLET    Take 1 tablet by mouth daily.     NIACIN 500 MG CR CAPSULE    Take 500 mg by mouth 2 (two) times daily.    OMEGA-3 FATTY ACIDS (FISH OIL) 1000 MG CAPS    Take 1,000 mg by mouth 2 (two) times daily.    PANTOPRAZOLE (PROTONIX) 40 MG TABLET    TAKE ONE TABLET TWICE DAILY   VITAMIN C (ASCORBIC ACID) 500 MG TABLET    Take 500 mg by mouth daily.     ZOLPIDEM (AMBIEN) 10 MG TABLET    Take 1 tablet at bed time for rest.  Modified Medications   No medications on file  Discontinued Medications   No medications on file     Physical Exam:  Filed Vitals:   04/11/14 1258  BP: 138/80  Pulse: 55  Temp: 98.6 F (37 C)  TempSrc: Oral  Resp: 10  Height: 5\' 8"  (1.727 m)  Weight: 152 lb (68.947 kg)  SpO2: 96%    Physical Exam  Constitutional: She is oriented to person, place, and time. She appears well-developed and well-nourished. No distress.  HENT:  Head: Normocephalic and atraumatic.  Mouth/Throat: Oropharynx is clear and moist. No oropharyngeal exudate.  Eyes: Conjunctivae are normal. Pupils are equal, round, and reactive to light.  Neck: Normal range of motion. Neck supple.  Cardiovascular: Normal rate, regular rhythm and normal heart sounds.   Pulmonary/Chest: Effort normal and breath sounds normal.  Abdominal: Soft. Bowel sounds are normal.  Musculoskeletal: She exhibits no edema or tenderness.    Neurological: She is alert and oriented to person, place, and time.  Skin: Skin is warm and dry. She is not diaphoretic.  Psychiatric: Her mood appears anxious. She exhibits a depressed mood.  Tearful during exam    Labs reviewed: Basic Metabolic Panel:  Recent Labs  09/24/13 0835  10/30/13 1128 11/26/13 1006 12/20/13 1514 04/06/14 1025  NA 139  < > 142 140  --  137  K  4.4  < > 3.9 4.3  --  3.7  CL 100  < > 102 103  --  99  CO2 26  < > 27 30  --  30  GLUCOSE 90  < > 114* 94  --  104*  BUN 13  < > 11 16  --  14  CREATININE 0.71  < > 0.61 0.7  --  0.8  CALCIUM 9.3  < > 9.3 9.2  --  8.8  TSH 3.150  --   --   --  1.11 1.82  < > = values in this interval not displayed. Liver Function Tests:  Recent Labs  09/24/13 0835 12/20/13 1514 04/06/14 1025  AST 18 20 23   ALT 16 14 28   ALKPHOS 72 57 72  BILITOT 0.4 0.5 0.8  PROT 6.5 6.8 6.4  ALBUMIN  --  3.8 3.5   No results for input(s): LIPASE, AMYLASE in the last 8760 hours. No results for input(s): AMMONIA in the last 8760 hours. CBC:  Recent Labs  10/26/13 1026 10/30/13 1128 04/06/14 1025  WBC 6.6 6.1 7.3  NEUTROABS 4.3 4.3 5.2  HGB 12.4 12.2 12.3  HCT 37.2 37.9 36.8  MCV 89 91.8 93.0  PLT  --  201 240.0   Lipid Panel:  Recent Labs  08/06/13 1153 09/24/13 0835  HDL 58 56  LDLCALC 114* 101*  TRIG 105 74  CHOLHDL 3.3 3.1   TSH:  Recent Labs  09/24/13 0835 12/20/13 1514 04/06/14 1025  TSH 3.150 1.11 1.82   A1C: Lab Results  Component Value Date   HGBA1C * 09/29/2009    5.8 (NOTE)                                                                       According to the ADA Clinical Practice Recommendations for 2011, when HbA1c is used as a screening test:   >=6.5%   Diagnostic of Diabetes Mellitus           (if abnormal result  is confirmed)  5.7-6.4%   Increased risk of developing Diabetes Mellitus  References:Diagnosis and Classification of Diabetes Mellitus,Diabetes QMGQ,6761,95(KDTOI 1):S62-S69 and  Standards of Medical Care in         Diabetes - 2011,Diabetes Care,2011,34  (Suppl 1):S11-S61.     Assessment/Plan  1. Depression with anxiety -chart reviewed and it appears she has tired several medications without good effects. Does not want to see psychiatrist but will see counselor. conts with hospice sessions currently. NO SI or HI Pt educated to get involved in exercise program Make a schedule to go out to lunch/dinner with friends Write in a journal for 20 mins every night on things that you are thankful for-- think ONLY positive thoughts for this time.   To follow up in 6 weeks, sooner if needed  20 mins Time TOTAL:  time greater than 50% of total time spent doing pt counseled and coordination of care regarding depression and coping management and techniques

## 2014-04-11 NOTE — Patient Instructions (Signed)
To cont counseling, if needed may go somewhere other than hospice Get involved in exercise program Make a schedule to go out to lunch/dinner with friends Write in a journal for 20 mins every night on things that you are thankful for-- think ONLY positive thoughts for this time.   Follow up in 6 weeks with DR Bubba Camp or Janett Billow NP

## 2014-04-11 NOTE — Addendum Note (Signed)
Addended by: Logan Bores on: 04/11/2014 02:02 PM   Modules accepted: Orders

## 2014-04-13 ENCOUNTER — Ambulatory Visit: Payer: Medicare Other | Admitting: Cardiology

## 2014-04-25 ENCOUNTER — Telehealth: Payer: Self-pay | Admitting: *Deleted

## 2014-04-25 NOTE — Telephone Encounter (Signed)
Patient called regarding depression medication that she spoke with Janett Billow about at her last visit. I informed her that she needed to make an appointment with Janett Billow and she stated that she could'nt do so right now.

## 2014-05-02 DIAGNOSIS — N8189 Other female genital prolapse: Secondary | ICD-10-CM | POA: Diagnosis not present

## 2014-05-03 ENCOUNTER — Encounter: Payer: Self-pay | Admitting: Internal Medicine

## 2014-05-03 ENCOUNTER — Ambulatory Visit (INDEPENDENT_AMBULATORY_CARE_PROVIDER_SITE_OTHER): Payer: Medicare Other | Admitting: Internal Medicine

## 2014-05-03 VITALS — BP 138/82 | HR 54 | Temp 98.2°F | Resp 18 | Ht 68.0 in | Wt 151.0 lb

## 2014-05-03 DIAGNOSIS — K219 Gastro-esophageal reflux disease without esophagitis: Secondary | ICD-10-CM

## 2014-05-03 DIAGNOSIS — F418 Other specified anxiety disorders: Secondary | ICD-10-CM | POA: Diagnosis not present

## 2014-05-03 DIAGNOSIS — I482 Chronic atrial fibrillation, unspecified: Secondary | ICD-10-CM

## 2014-05-03 DIAGNOSIS — I10 Essential (primary) hypertension: Secondary | ICD-10-CM | POA: Diagnosis not present

## 2014-05-03 NOTE — Progress Notes (Signed)
Patient ID: Marissa Bowen, female   DOB: Aug 10, 1926, 81 y.o.   MRN: [id number]    Chief Complaint  Patient presents with  . Medical Management of Chronic Issues   Allergies  Allergen Reactions  . Hydrocodone     REACTION: migraines  . Ibuprofen Other (See Comments)    ulcers  . Oxycodone Hcl     REACTION: migraines  . Prednisone Other (See Comments)    ulcers  . Statins Other (See Comments)    Aches and pains  . Penicillins Swelling and Rash   HPI 81 y/o patient with history of afib, CAD, HTN, depression and anxiety is here for follow up. She lost her husband she was married to for 106 years November 2015. She is tearful, mentions she is coping better at present. She feels low but her energy level is fair. She tries to keep herself busy. She has been able to sleep at night. Her appetite is poor.    Review of Systems  Constitutional: Negative for fever, chills, diaphoresis.  HENT: Negative for congestion.   Eyes: Negative for blurred vision, double vision and discharge.  Respiratory: Negative for cough, sputum production, shortness of breath and wheezing.   Cardiovascular: Negative for chest pain, palpitations, orthopnea and leg swelling.  Gastrointestinal: Negative for heartburn, nausea, vomiting, abdominal pain Genitourinary: Negative for dysuria. Has a pessary in place and had not changed it for  Sometime, saw her urologist yesterday and now is on estrace cream  Musculoskeletal: Negative for back pain, falls Skin: Negative for itching and rash.  Neurological: PNegative for dizziness, tingling, focal weakness and headaches.  Psychiatric/Behavioral: Negative for memory loss. The patient is anxious.    Past Medical History  Diagnosis Date  . BACK PAIN, LUMBAR 09/27/2009  . CORONARY ARTERY DISEASE     a. s/p remote CABG 1997, 3V.;  b. LexiScan Myoview (8/15): No ischemia, EF 72%, normal study  . DIVERTICULITIS, HX OF 11/25/2006  . FATIGUE 07/21/2008  . GERD 11/25/2006  .  HYPERLIPIDEMIA   . HYPERTENSION   . IBS 12/07/2008  . MIGRAINE WITH AURA 08/13/2007  . Muscle weakness (generalized) 09/27/2009  . OSTEOARTHRITIS 08/13/2007  . TRANSIENT ISCHEMIC ATTACK 04/13/2007  . Hiatal hernia   . Paroxysmal atrial fibrillation Dx 02/2012  . Anxiety   . CVA (cerebral infarction)     a. Thalamic infarct 09/2009.  Marland Kitchen Anemia   . GI bleed     a. 09/2009: secondary to antral ulcer.  . Mild carotid artery disease     a. 0-39% bilat 05/2011 (f/u recommended 05/2013).  . Gastric ulcer   . Heart attack   . Acute fibrinous pericarditis    Current Outpatient Prescriptions on File Prior to Visit  Medication Sig Dispense Refill  . acetaminophen (TYLENOL) 500 MG tablet Take 1,000 mg by mouth every 6 (six) hours as needed (for pain).     Marland Kitchen amiodarone (PACERONE) 200 MG tablet Take 0.5 tablets (100 mg total) by mouth daily. 15 tablet 3  . apixaban (ELIQUIS) 5 MG TABS tablet Take 1 tablet (5 mg total) by mouth 2 (two) times daily. 180 tablet 3  . Flaxseed, Linseed, (FLAXSEED OIL) 1000 MG CAPS Take 1,000 mg by mouth at bedtime.     . fluticasone (FLONASE) 50 MCG/ACT nasal spray Place 2 sprays into both nostrils daily.    Marland Kitchen LORazepam (ATIVAN) 0.5 MG tablet Take one tablet every eight hours as needed for anxiety 90 tablet 0  . losartan (COZAAR) 50 MG tablet  Take 50 mg by mouth 2 (two) times daily.    . metoprolol succinate (TOPROL-XL) 25 MG 24 hr tablet take 1/2 tab qd 15 tablet 3  . Multiple Vitamin (MULTIVITAMIN) tablet Take 1 tablet by mouth daily.      . niacin 500 MG CR capsule Take 500 mg by mouth 2 (two) times daily.     . Omega-3 Fatty Acids (FISH OIL) 1000 MG CAPS Take 1,000 mg by mouth 2 (two) times daily.     . pantoprazole (PROTONIX) 40 MG tablet TAKE ONE TABLET TWICE DAILY 180 tablet 5  . vitamin C (ASCORBIC ACID) 500 MG tablet Take 500 mg by mouth daily.      Marland Kitchen zolpidem (AMBIEN) 10 MG tablet Take 1 tablet at bed time for rest. 30 tablet 0  . [DISCONTINUED] sertraline (ZOLOFT)  25 MG tablet Take 1 tablet (25 mg total) by mouth daily. 30 tablet 2   No current facility-administered medications on file prior to visit.    Physical exam BP 138/82 mmHg  Pulse 54  Temp(Src) 98.2 F (36.8 C) (Oral)  Resp 18  Ht 5\' 8"  (1.727 m)  Wt 151 lb (68.493 kg)  BMI 22.96 kg/m2  SpO2 97%  Wt Readings from Last 3 Encounters:  05/03/14 151 lb (68.493 kg)  04/11/14 152 lb (68.947 kg)  04/04/14 154 lb (69.854 kg)   General- elderly female in no acute distress Head- atraumatic, normocephalic Eyes- PERRLA, EOMI, no pallor, no icterus, no discharge Neck- no lymphadenopathy Mouth- normal mucus membrane Cardiovascular- normal s1,s2, no murmurs Respiratory- bilateral clear to auscultation, no wheeze, no rhonchi, no crackles Abdomen- bowel sounds present, soft, non tender Musculoskeletal- able to move all 4 extremities, no spinal and paraspinal tenderness, steady gait, no use of assistive device, normal range of motion, no leg edema Neurological- no focal deficit Skin- warm and dry Psychiatry- alert and oriented to person, place and time  Labs- Lab Results  Component Value Date   WBC 7.3 04/06/2014   HGB 12.3 04/06/2014   HCT 36.8 04/06/2014   MCV 93.0 04/06/2014   PLT 240.0 04/06/2014   CMP Latest Ref Rng 04/06/2014 12/20/2013 11/26/2013  Glucose 70 - 99 mg/dL 104(H) - 94  BUN 6 - 23 mg/dL 14 - 16  Creatinine 0.4 - 1.2 mg/dL 0.8 - 0.7  Sodium 135 - 145 mEq/L 137 - 140  Potassium 3.5 - 5.1 mEq/L 3.7 - 4.3  Chloride 96 - 112 mEq/L 99 - 103  CO2 19 - 32 mEq/L 30 - 30  Calcium 8.4 - 10.5 mg/dL 8.8 - 9.2  Total Protein 6.0 - 8.3 g/dL 6.4 6.8 -  Albumin 3.5 - 4.7 g/dL - - -  Total Bilirubin 0.2 - 1.2 mg/dL 0.8 0.5 -  Alkaline Phos 39 - 117 U/L 72 57 -  AST 0 - 37 U/L 23 20 -  ALT 0 - 35 U/L 28 14 -   Lab Results  Component Value Date   TSH 1.82 04/06/2014  ' Lab Results  Component Value Date   HGBA1C * 09/29/2009    5.8 (NOTE)  According to the ADA Clinical Practice Recommendations for 2011, when HbA1c is used as a screening test:   >=6.5%   Diagnostic of Diabetes Mellitus           (if abnormal result  is confirmed)  5.7-6.4%   Increased risk of developing Diabetes Mellitus  References:Diagnosis and Classification of Diabetes Mellitus,Diabetes ONGE,9528,41(LKGMW 1):S62-S69 and Standards of Medical Care in         Diabetes - 2011,Diabetes NUUV,2536,64  (Suppl 1):S11-S61.    Lipid Panel     Component Value Date/Time   CHOL 174 12/16/2012 1208   TRIG 74 09/24/2013 0835   HDL 56 09/24/2013 0835   HDL 47.90 12/16/2012 1208   CHOLHDL 3.1 09/24/2013 0835   CHOLHDL 4 12/16/2012 1208   VLDL 28.4 12/16/2012 1208   LDLCALC 101* 09/24/2013 0835   LDLCALC 98 12/16/2012 1208    Assessment/plan  Depression with anxiety Patient is currently in grief period. She does not want antidepressant, she has been tried on several in past without desired benefit. Behavioral therapy for now. Continue her prn ativan.    chronic atrial fibrillation Rate controlled. Continue amiodarone and eliquis with toprol xl for rate control. Heart rate on lower side today but pt is asymptomatic  HYPERTENSION Stable. Continue cozaar 50 mg twice daily   gerd Continue protonix 40 mg bid, advised on changing to once a day but pt to consider it next visit if symptoms remain under control

## 2014-05-10 ENCOUNTER — Telehealth: Payer: Self-pay | Admitting: Cardiology

## 2014-05-10 NOTE — Telephone Encounter (Signed)
Attempted to call patient. Phone rang/no ans no machine.

## 2014-05-10 NOTE — Telephone Encounter (Signed)
New Message  Pt wanted to speak w/ Rn about her Medication. Please call back/discuss.

## 2014-05-10 NOTE — Telephone Encounter (Signed)
Patient has been receiving Eliquis thru [facility]. Rep at [facility] advise her that she is no longer eligible for free meds. She was told it would cost her $45. For 30 day supply. Not sure she can spend that. Not  Out. Has 60 pills on hand.

## 2014-05-11 NOTE — Telephone Encounter (Signed)
Pt states she was told as of January 1 ,2016 she will be out of the doughnut hole, her Eliquis will cost her $45/month and she can afford that. She will let me know if this changes.

## 2014-05-16 DIAGNOSIS — R3915 Urgency of urination: Secondary | ICD-10-CM | POA: Diagnosis not present

## 2014-05-18 ENCOUNTER — Other Ambulatory Visit: Payer: Self-pay | Admitting: Nurse Practitioner

## 2014-05-30 ENCOUNTER — Other Ambulatory Visit: Payer: Self-pay | Admitting: Nurse Practitioner

## 2014-05-31 DIAGNOSIS — N390 Urinary tract infection, site not specified: Secondary | ICD-10-CM | POA: Diagnosis not present

## 2014-05-31 DIAGNOSIS — N8189 Other female genital prolapse: Secondary | ICD-10-CM | POA: Diagnosis not present

## 2014-06-01 ENCOUNTER — Telehealth: Payer: Self-pay | Admitting: Internal Medicine

## 2014-06-01 NOTE — Telephone Encounter (Signed)
Patient called in for an appointment this morning wanting to see Dr. Bubba Camp right away. She did not wish to tell me what was going on with her but said she needed an appointment right away. I explained to her that Dr. Bubba Camp is out of the office next week but she happened to have a cancelation available for Feb 16 at 10:45 she said she can't do anything that day because it was all booked up and that would be to far away. I offered to schedule her with Dr. Eulas Post today but she refused stating she doesn't want ANY other doctors she said she wanted to see Dr. Bubba Camp. I told her I was sorry but I did not have anything before the 16th and she said ok and hung up

## 2014-06-13 ENCOUNTER — Other Ambulatory Visit: Payer: Self-pay | Admitting: Internal Medicine

## 2014-06-21 ENCOUNTER — Encounter: Payer: Self-pay | Admitting: Internal Medicine

## 2014-06-23 DIAGNOSIS — N8189 Other female genital prolapse: Secondary | ICD-10-CM | POA: Diagnosis not present

## 2014-07-15 ENCOUNTER — Other Ambulatory Visit: Payer: Self-pay | Admitting: Internal Medicine

## 2014-08-01 ENCOUNTER — Other Ambulatory Visit: Payer: Self-pay | Admitting: Internal Medicine

## 2014-08-02 ENCOUNTER — Ambulatory Visit: Payer: Self-pay | Admitting: Internal Medicine

## 2014-08-03 ENCOUNTER — Ambulatory Visit: Payer: Medicare Other | Admitting: Internal Medicine

## 2014-08-04 DIAGNOSIS — N8189 Other female genital prolapse: Secondary | ICD-10-CM | POA: Diagnosis not present

## 2014-08-05 ENCOUNTER — Encounter: Payer: Self-pay | Admitting: Internal Medicine

## 2014-08-05 ENCOUNTER — Ambulatory Visit (INDEPENDENT_AMBULATORY_CARE_PROVIDER_SITE_OTHER): Payer: Medicare Other | Admitting: Internal Medicine

## 2014-08-05 VITALS — BP 162/58 | HR 54 | Temp 98.3°F | Resp 18 | Ht 68.0 in | Wt 151.0 lb

## 2014-08-05 DIAGNOSIS — I10 Essential (primary) hypertension: Secondary | ICD-10-CM

## 2014-08-05 DIAGNOSIS — F4321 Adjustment disorder with depressed mood: Secondary | ICD-10-CM

## 2014-08-05 DIAGNOSIS — I480 Paroxysmal atrial fibrillation: Secondary | ICD-10-CM | POA: Diagnosis not present

## 2014-08-05 DIAGNOSIS — M150 Primary generalized (osteo)arthritis: Secondary | ICD-10-CM | POA: Diagnosis not present

## 2014-08-05 DIAGNOSIS — E785 Hyperlipidemia, unspecified: Secondary | ICD-10-CM | POA: Diagnosis not present

## 2014-08-05 DIAGNOSIS — F418 Other specified anxiety disorders: Secondary | ICD-10-CM

## 2014-08-05 DIAGNOSIS — M159 Polyosteoarthritis, unspecified: Secondary | ICD-10-CM

## 2014-08-05 DIAGNOSIS — J309 Allergic rhinitis, unspecified: Secondary | ICD-10-CM | POA: Diagnosis not present

## 2014-08-05 DIAGNOSIS — I4891 Unspecified atrial fibrillation: Secondary | ICD-10-CM | POA: Diagnosis not present

## 2014-08-05 DIAGNOSIS — F341 Dysthymic disorder: Secondary | ICD-10-CM | POA: Diagnosis not present

## 2014-08-05 MED ORDER — LORAZEPAM 1 MG PO TABS: 1.0000 mg | ORAL_TABLET | Freq: Three times a day (TID) | ORAL | Status: DC | PRN

## 2014-08-05 NOTE — Progress Notes (Signed)
Patient ID: Marissa Bowen, female   DOB: 05/17/1926, 81 y.o.   MRN: [id number]    Facility  [facility]    Place of Service:   OFFICE   Allergies  Allergen Reactions  . Hydrocodone     REACTION: migraines  . Ibuprofen Other (See Comments)    ulcers  . Oxycodone Hcl     REACTION: migraines  . Prednisone Other (See Comments)    ulcers  . Statins Other (See Comments)    Aches and pains  . Penicillins Swelling and Rash    Chief Complaint  Patient presents with  . Medical Management of Chronic Issues    HPI:  81 yo female seen today for f/u. She saw her GYN yesterday and no infection noted around pessary. She wears one due to bladder prolapse and gets checked q6weeks. Estrace cream helps when she has an infection around bladder.  She takes losartan and metoprolol for BP control. She forgot to take her medicine this AM.  Afib controlled with amiodarone and eliquis as blood thinner. No blood in stool or urine. She sees cardiology and has f/u scheduled  She has intermittent bouts of depression/anxiety since her spouse expired in November 2015. Takes prn lorazepam. Zolpidem helps sleep. She has to take 2 lorazepam tabs at night along with zolpidem to help her sleep.  She has seasonal allergies and needs RF on flutacisone.  Past Medical History  Diagnosis Date  . BACK PAIN, LUMBAR 09/27/2009  . CORONARY ARTERY DISEASE     a. s/p remote CABG 1997, 3V.;  b. LexiScan Myoview (8/15): No ischemia, EF 72%, normal study  . DIVERTICULITIS, HX OF 11/25/2006  . FATIGUE 07/21/2008  . GERD 11/25/2006  . HYPERLIPIDEMIA   . HYPERTENSION   . IBS 12/07/2008  . MIGRAINE WITH AURA 08/13/2007  . Muscle weakness (generalized) 09/27/2009  . OSTEOARTHRITIS 08/13/2007  . TRANSIENT ISCHEMIC ATTACK 04/13/2007  . Hiatal hernia   . Paroxysmal atrial fibrillation Dx 02/2012  . Anxiety   . CVA (cerebral infarction)     a. Thalamic infarct 09/2009.  Marland Kitchen Anemia   . GI bleed     a. 09/2009: secondary to antral ulcer.    . Mild carotid artery disease     a. 0-39% bilat 05/2011 (f/u recommended 05/2013).  . Gastric ulcer   . Heart attack   . Acute fibrinous pericarditis    Past Surgical History  Procedure Laterality Date  . Tonsillectomy    . Abdominal hysterectomy  1973  . Coronary artery bypass graft  1997    Dr Melvenia Needles  . Coronary angioplasty  1997    Dr Lia Foyer   History   Social History  . Marital Status: Married    Spouse Name: N/A  . Number of Children: N/A  . Years of Education: N/A   Social History Main Topics  . Smoking status: Never Smoker   . Smokeless tobacco: Never Used  . Alcohol Use: No  . Drug Use: No  . Sexual Activity: No   Other Topics Concern  . None   Social History Narrative     Medications: Patient's Medications  New Prescriptions   No medications on file  Previous Medications   ACETAMINOPHEN (TYLENOL) 500 MG TABLET    Take 1,000 mg by mouth every 6 (six) hours as needed (for pain).    AMIODARONE (PACERONE) 200 MG TABLET    Take 0.5 tablets (100 mg total) by mouth daily.   APIXABAN (ELIQUIS) 5 MG TABS TABLET  Take 1 tablet (5 mg total) by mouth 2 (two) times daily.   ESTRADIOL (ESTRACE) 0.1 MG/GM VAGINAL CREAM    Place 1 Applicatorful vaginally 3 (three) times a week.   FLAXSEED, LINSEED, (FLAXSEED OIL) 1000 MG CAPS    Take 1,000 mg by mouth at bedtime.    FLUTICASONE (FLONASE) 50 MCG/ACT NASAL SPRAY    Place 2 sprays into both nostrils daily.   LORAZEPAM (ATIVAN) 0.5 MG TABLET    TAKE ONE TABLET EVERY EIGHT HOURS AS NEEDED FOR ANXIETY   LOSARTAN (COZAAR) 50 MG TABLET    Take 50 mg by mouth 2 (two) times daily.   METOPROLOL SUCCINATE (TOPROL-XL) 25 MG 24 HR TABLET    take 1/2 tab qd   MULTIPLE VITAMIN (MULTIVITAMIN) TABLET    Take 1 tablet by mouth daily.     NIACIN 500 MG CR CAPSULE    Take 500 mg by mouth 2 (two) times daily.    OMEGA-3 FATTY ACIDS (FISH OIL) 1000 MG CAPS    Take 1,000 mg by mouth 2 (two) times daily.    PANTOPRAZOLE (PROTONIX) 40 MG  TABLET    TAKE ONE TABLET TWICE DAILY   VITAMIN C (ASCORBIC ACID) 500 MG TABLET    Take 500 mg by mouth daily.     ZOLPIDEM (AMBIEN) 10 MG TABLET    TAKE ONE TABLET AT BEDTIME FOR REST  Modified Medications   No medications on file  Discontinued Medications   No medications on file     Review of Systems  Constitutional: Negative for fever, chills, diaphoresis, activity change, appetite change and fatigue.  HENT: Negative for ear pain and sore throat.   Eyes: Negative for visual disturbance.  Respiratory: Negative for cough, chest tightness and shortness of breath.   Cardiovascular: Negative for chest pain, palpitations and leg swelling.  Gastrointestinal: Negative for nausea, vomiting, abdominal pain, diarrhea, constipation and blood in stool.  Genitourinary: Negative for dysuria.  Musculoskeletal: Positive for joint swelling (left shoulder). Negative for arthralgias.  Neurological: Positive for tremors. Negative for dizziness, numbness and headaches.  Psychiatric/Behavioral: Positive for sleep disturbance. The patient is nervous/anxious.     Filed Vitals:   08/05/14 0821  BP: 162/58  Pulse: 54  Temp: 98.3 F (36.8 C)  TempSrc: Oral  Resp: 18  Height: 5\' 8"  (1.727 m)  Weight: 151 lb (68.493 kg)  SpO2: 96%   Body mass index is 22.96 kg/(m^2).  Physical Exam  Constitutional: She is oriented to person, place, and time. She appears well-developed and well-nourished.  Tearful but in NAD  HENT:  Mouth/Throat: Oropharynx is clear and moist. No oropharyngeal exudate.  Eyes: Pupils are equal, round, and reactive to light. No scleral icterus.  Neck: Neck supple. No tracheal deviation present. No thyromegaly present.  Cardiovascular: Regular rhythm, normal heart sounds and intact distal pulses.  Bradycardia present.  Exam reveals no gallop and no friction rub.   No murmur heard. No LE edema b/l. no calf TTP. No carotid bruit b/l  Pulmonary/Chest: Effort normal and breath sounds  normal. No stridor. No respiratory distress. She has no wheezes. She has no rales.  Abdominal: Soft. Bowel sounds are normal. She exhibits no distension and no mass. There is no tenderness. There is no rebound and no guarding.  Musculoskeletal:       Left shoulder: She exhibits decreased range of motion (extension), tenderness (posterior), swelling and crepitus.       Arms: Lymphadenopathy:    She has no cervical adenopathy.  Neurological: She is alert and oriented to person, place, and time. She has normal reflexes. She displays tremor (resting).  Skin: Skin is warm and dry. No rash noted.  Psychiatric: Her behavior is normal. Judgment and thought content normal. Her mood appears anxious.     Labs reviewed: No visits with results within 3 Month(s) from this visit. Latest known visit with results is:  Lab on 04/06/2014  Component Date Value Ref Range Status  . Sodium 04/06/2014 137  135 - 145 mEq/L Final  . Potassium 04/06/2014 3.7  3.5 - 5.1 mEq/L Final  . Chloride 04/06/2014 99  96 - 112 mEq/L Final  . CO2 04/06/2014 30  19 - 32 mEq/L Final  . Glucose, Bld 04/06/2014 104* 70 - 99 mg/dL Final  . BUN 04/06/2014 14  6 - 23 mg/dL Final  . Creatinine, Ser 04/06/2014 0.8  0.4 - 1.2 mg/dL Final  . Calcium 04/06/2014 8.8  8.4 - 10.5 mg/dL Final  . GFR 04/06/2014 75.64  >60.00 mL/min Final  . Total Bilirubin 04/06/2014 0.8  0.2 - 1.2 mg/dL Final  . Bilirubin, Direct 04/06/2014 0.1  0.0 - 0.3 mg/dL Final  . Alkaline Phosphatase 04/06/2014 72  39 - 117 U/L Final  . AST 04/06/2014 23  0 - 37 U/L Final  . ALT 04/06/2014 28  0 - 35 U/L Final  . Total Protein 04/06/2014 6.4  6.0 - 8.3 g/dL Final  . Albumin 04/06/2014 3.5  3.5 - 5.2 g/dL Final  . TSH 04/06/2014 1.82  0.35 - 4.50 uIU/mL Final  . WBC 04/06/2014 7.3  4.0 - 10.5 K/uL Final  . RBC 04/06/2014 3.96  3.87 - 5.11 Mil/uL Final  . Hemoglobin 04/06/2014 12.3  12.0 - 15.0 g/dL Final  . HCT 04/06/2014 36.8  36.0 - 46.0 % Final  . MCV  04/06/2014 93.0  78.0 - 100.0 fl Final  . MCHC 04/06/2014 33.3  30.0 - 36.0 g/dL Final  . RDW 04/06/2014 14.0  11.5 - 15.5 % Final  . Platelets 04/06/2014 240.0  150.0 - 400.0 K/uL Final  . Neutrophils Relative % 04/06/2014 71.0  43.0 - 77.0 % Final  . Lymphocytes Relative 04/06/2014 19.4  12.0 - 46.0 % Final  . Monocytes Relative 04/06/2014 6.5  3.0 - 12.0 % Final  . Eosinophils Relative 04/06/2014 2.8  0.0 - 5.0 % Final  . Basophils Relative 04/06/2014 0.3  0.0 - 3.0 % Final  . Neutro Abs 04/06/2014 5.2  1.4 - 7.7 K/uL Final  . Lymphs Abs 04/06/2014 1.4  0.7 - 4.0 K/uL Final  . Monocytes Absolute 04/06/2014 0.5  0.1 - 1.0 K/uL Final  . Eosinophils Absolute 04/06/2014 0.2  0.0 - 0.7 K/uL Final  . Basophils Absolute 04/06/2014 0.0  0.0 - 0.1 K/uL Final     Assessment/Plan   ICD-9-CM ICD-10-CM   1. Grief reaction - failing to change as expected 309.0 F43.21 LORazepam (ATIVAN) 1 MG tablet  2. Depression with anxiety - uncontrolled and related to #1 300.4 F41.8 LORazepam (ATIVAN) 1 MG tablet     CMP  3. Paroxysmal atrial fibrillation -rate controlled on amiodarone and BB 427.31 I48.0 CMP  4. Essential hypertension - elevated BP today as she has not had her medication yet 401.9 I10   5. Primary osteoarthritis involving multiple joints - continue Tylenol ES 715.09 M15.0   6. Allergic rhinitis, unspecified allergic rhinitis type - on flonase 477.9 J30.9   7. Hyperlipidemia LDL goal <100 - on niacin and flax seed  272.4 E78.5 Lipid Panel   --may pick up flonase OTC to use as directed  --recommend individual grief counseling. She will call make her appt  --f/u with cardio and GYN as scheduled  --increase lorazepam 1mg  q8hrs prn. She will call when she needs a new Rx  --will call with lab results  --RTO for BP reck after she takes her AM meds  --f/u in 3 mos for routine OV   Arzella Rehmann S. Perlie Gold  [facility] [address] [place], [place] [address] [phone] Office (Wednesdays and Fridays 8 AM - 5 PM) [phone] Cell (Monday-Friday 8 AM - 5 PM)

## 2014-08-05 NOTE — Patient Instructions (Signed)
Recommend individual counseling for grief  Recommend OTC flonase for seasonal allergy  Continue current medications as ordered  Follow up in 3 months. Will call with lab results

## 2014-08-06 LAB — COMPREHENSIVE METABOLIC PANEL
A/G RATIO: 1.8 (ref 1.1–2.5)
ALBUMIN: 4.2 g/dL (ref 3.5–4.7)
ALK PHOS: 69 IU/L (ref 39–117)
ALT: 22 IU/L (ref 0–32)
AST: 20 IU/L (ref 0–40)
BUN/Creatinine Ratio: 20 (ref 11–26)
BUN: 14 mg/dL (ref 8–27)
Bilirubin Total: 0.5 mg/dL (ref 0.0–1.2)
CALCIUM: 9.4 mg/dL (ref 8.7–10.3)
CO2: 27 mmol/L (ref 18–29)
Chloride: 98 mmol/L (ref 97–108)
Creatinine, Ser: 0.69 mg/dL (ref 0.57–1.00)
GFR calc non Af Amer: 80 mL/min/{1.73_m2} (ref >59)
GFR, EST AFRICAN AMERICAN: 92 mL/min/{1.73_m2} (ref >59)
GLOBULIN, TOTAL: 2.4 g/dL (ref 1.5–4.5)
Glucose: 82 mg/dL (ref 65–99)
POTASSIUM: 4.1 mmol/L (ref 3.5–5.2)
Sodium: 140 mmol/L (ref 134–144)
TOTAL PROTEIN: 6.6 g/dL (ref 6.0–8.5)

## 2014-08-06 LAB — LIPID PANEL
CHOL/HDL RATIO: 2.9 ratio (ref 0.0–4.4)
CHOLESTEROL TOTAL: 210 mg/dL — AB (ref 100–199)
HDL: 72 mg/dL (ref >39)
LDL Calculated: 119 mg/dL — ABNORMAL HIGH (ref 0–99)
TRIGLYCERIDES: 94 mg/dL (ref 0–149)
VLDL Cholesterol Cal: 19 mg/dL (ref 5–40)

## 2014-08-08 ENCOUNTER — Other Ambulatory Visit: Payer: Self-pay | Admitting: Nurse Practitioner

## 2014-08-22 ENCOUNTER — Encounter: Payer: Self-pay | Admitting: Internal Medicine

## 2014-08-26 ENCOUNTER — Other Ambulatory Visit: Payer: Self-pay | Admitting: Cardiology

## 2014-08-30 ENCOUNTER — Other Ambulatory Visit: Payer: Self-pay | Admitting: Internal Medicine

## 2014-09-05 ENCOUNTER — Other Ambulatory Visit: Payer: Self-pay | Admitting: Nurse Practitioner

## 2014-09-14 ENCOUNTER — Other Ambulatory Visit: Payer: Self-pay | Admitting: Internal Medicine

## 2014-09-21 DIAGNOSIS — N8189 Other female genital prolapse: Secondary | ICD-10-CM | POA: Diagnosis not present

## 2014-09-22 ENCOUNTER — Ambulatory Visit: Payer: Medicare Other | Admitting: Nurse Practitioner

## 2014-09-30 ENCOUNTER — Other Ambulatory Visit: Payer: Self-pay | Admitting: Nurse Practitioner

## 2014-09-30 ENCOUNTER — Other Ambulatory Visit: Payer: Self-pay | Admitting: *Deleted

## 2014-09-30 MED ORDER — LORAZEPAM 0.5 MG PO TABS: ORAL_TABLET | ORAL | Status: DC

## 2014-09-30 NOTE — Telephone Encounter (Signed)
Patient called requested and phoned into pharmacy.

## 2014-10-07 ENCOUNTER — Ambulatory Visit (INDEPENDENT_AMBULATORY_CARE_PROVIDER_SITE_OTHER): Payer: Medicare Other | Admitting: Internal Medicine

## 2014-10-07 ENCOUNTER — Encounter: Payer: Self-pay | Admitting: Internal Medicine

## 2014-10-07 VITALS — BP 138/80 | HR 53 | Temp 98.5°F | Resp 18 | Ht 68.0 in | Wt 150.6 lb

## 2014-10-07 DIAGNOSIS — R195 Other fecal abnormalities: Secondary | ICD-10-CM

## 2014-10-07 DIAGNOSIS — F418 Other specified anxiety disorders: Secondary | ICD-10-CM

## 2014-10-07 DIAGNOSIS — F4321 Adjustment disorder with depressed mood: Secondary | ICD-10-CM

## 2014-10-07 MED ORDER — LORAZEPAM 1 MG PO TABS: 1.0000 mg | ORAL_TABLET | Freq: Three times a day (TID) | ORAL | Status: DC | PRN

## 2014-10-07 NOTE — Progress Notes (Signed)
Patient ID: Marissa Bowen, female   DOB: [birth year], 81 y.o.   MRN: [id number]    Location:    [facility]   Place of Service:   OFFICE  Chief Complaint  Patient presents with  . Acute Visit    HPI:  81 yo female seen today for acute visit. She is c/a intermittent dark stools. No bloody stools. Last colonoscopy 10 yrs ago. She takes eliquis for afib anticoagulation. She has "aggravating" RLQ pain. Hx gastric ulcer and takes pantoprazole daily. No vomiting but has nausea. She reports increased worrying due to personal issues.  She loss her spouse in Nov 2015. She is c/a finances and runs out of money before the end of the month  Past Medical History  Diagnosis Date  . BACK PAIN, LUMBAR 09/27/2009  . CORONARY ARTERY DISEASE     a. s/p remote CABG 1997, 3V.;  b. LexiScan Myoview (8/15): No ischemia, EF 72%, normal study  . DIVERTICULITIS, HX OF 11/25/2006  . FATIGUE 07/21/2008  . GERD 11/25/2006  . HYPERLIPIDEMIA   . HYPERTENSION   . IBS 12/07/2008  . MIGRAINE WITH AURA 08/13/2007  . Muscle weakness (generalized) 09/27/2009  . OSTEOARTHRITIS 08/13/2007  . TRANSIENT ISCHEMIC ATTACK 04/13/2007  . Hiatal hernia   . Paroxysmal atrial fibrillation Dx 02/2012  . Anxiety   . CVA (cerebral infarction)     a. Thalamic infarct 09/2009.  Marland Kitchen Anemia   . GI bleed     a. 09/2009: secondary to antral ulcer.  . Mild carotid artery disease     a. 0-39% bilat 05/2011 (f/u recommended 05/2013).  . Gastric ulcer   . Heart attack   . Acute fibrinous pericarditis     Past Surgical History  Procedure Laterality Date  . Tonsillectomy    . Abdominal hysterectomy  1973  . Coronary artery bypass graft  1997    Dr Melvenia Needles  . Coronary angioplasty  1997    Dr Lia Foyer    Patient Care Team: Gildardo Cranker, DO as PCP - General (Internal Medicine) Hillary Bow, MD (Cardiology) Melissa Montane, MD as Consulting Physician (Otolaryngology) Arvella Nigh, MD as Consulting Physician (Obstetrics and Gynecology) Rutherford Guys,  MD as Consulting Physician (Ophthalmology)  History   Social History  . Marital Status: Married    Spouse Name: N/A  . Number of Children: N/A  . Years of Education: N/A   Occupational History  . Not on file.   Social History Main Topics  . Smoking status: Never Smoker   . Smokeless tobacco: Never Used  . Alcohol Use: No  . Drug Use: No  . Sexual Activity: No   Other Topics Concern  . Not on file   Social History Narrative     reports that she has never smoked. She has never used smokeless tobacco. She reports that she does not drink alcohol or use illicit drugs.  Allergies  Allergen Reactions  . Hydrocodone     REACTION: migraines  . Ibuprofen Other (See Comments)    ulcers  . Oxycodone Hcl     REACTION: migraines  . Prednisone Other (See Comments)    ulcers  . Statins Other (See Comments)    Aches and pains  . Penicillins Swelling and Rash    Medications: Patient's Medications  New Prescriptions   No medications on file  Previous Medications   ACETAMINOPHEN (TYLENOL) 500 MG TABLET    Take 1,000 mg by mouth every 6 (six) hours as needed (for pain).  AMIODARONE (PACERONE) 200 MG TABLET    TAKE ONE-HALF TABLET DAILY   APIXABAN (ELIQUIS) 5 MG TABS TABLET    Take 1 tablet (5 mg total) by mouth 2 (two) times daily.   ESTRADIOL (ESTRACE) 0.1 MG/GM VAGINAL CREAM    Place 1 Applicatorful vaginally 3 (three) times a week.   FLAXSEED, LINSEED, (FLAXSEED OIL) 1000 MG CAPS    Take 1,000 mg by mouth at bedtime.    FLUTICASONE (FLONASE) 50 MCG/ACT NASAL SPRAY    Place 2 sprays into both nostrils daily.   LOSARTAN (COZAAR) 50 MG TABLET    Take 50 mg by mouth 2 (two) times daily.   METOPROLOL SUCCINATE (TOPROL-XL) 25 MG 24 HR TABLET    TAKE ONE-HALF TABLET DAILY   MULTIPLE VITAMIN (MULTIVITAMIN) TABLET    Take 1 tablet by mouth daily.     NIACIN 500 MG CR CAPSULE    Take 500 mg by mouth 2 (two) times daily.    OMEGA-3 FATTY ACIDS (FISH OIL) 1000 MG CAPS    Take 1,000 mg  by mouth 2 (two) times daily.    PANTOPRAZOLE (PROTONIX) 40 MG TABLET    TAKE ONE TABLET TWICE DAILY   VITAMIN C (ASCORBIC ACID) 500 MG TABLET    Take 500 mg by mouth daily.     ZOLPIDEM (AMBIEN) 10 MG TABLET    TAKE ONE TABLET AT BEDTIME FOR REST  Modified Medications   Modified Medication Previous Medication   LORAZEPAM (ATIVAN) 1 MG TABLET LORazepam (ATIVAN) 1 MG tablet      Take 1 tablet (1 mg total) by mouth every 8 (eight) hours as needed for anxiety.    Take 1 tablet (1 mg total) by mouth every 8 (eight) hours as needed for anxiety.  Discontinued Medications   ELIQUIS 5 MG TABS TABLET    TAKE ONE TABLET TWICE DAILY   LORAZEPAM (ATIVAN) 0.5 MG TABLET    TAKE ONE TABLET EVERY EIGHT HOURS AS NEEDED FOR ANXIETY    Review of Systems  Constitutional: Negative for fever, chills, diaphoresis, activity change, appetite change and fatigue.  HENT: Negative for ear pain and sore throat.   Eyes: Negative for visual disturbance.  Respiratory: Negative for cough, chest tightness and shortness of breath.   Cardiovascular: Negative for chest pain, palpitations and leg swelling.  Gastrointestinal: Negative for nausea, vomiting, abdominal pain, diarrhea, constipation and blood in stool.       Change in stool  Genitourinary: Negative for dysuria.  Musculoskeletal: Negative for arthralgias.  Neurological: Negative for dizziness, tremors, numbness and headaches.  Psychiatric/Behavioral: Positive for sleep disturbance and dysphoric mood. The patient is nervous/anxious.     Filed Vitals:   10/07/14 1543  BP: 138/80  Pulse: 53  Temp: 98.5 F (36.9 C)  TempSrc: Oral  Resp: 18  Height: 5\' 8"  (1.727 m)  Weight: 150 lb 9.6 oz (68.312 kg)  SpO2: 97%   Body mass index is 22.9 kg/(m^2).  Physical Exam  Constitutional: She is oriented to person, place, and time. She appears well-developed and well-nourished. No distress.  Cardiovascular: Normal rate, regular rhythm and intact distal pulses.  Exam  reveals no gallop and no friction rub.   Murmur (1/6 SEM) heard. No LE edema b/l. No calf TTP  Pulmonary/Chest: Effort normal and breath sounds normal. No respiratory distress. She has no wheezes. She has no rales.  Abdominal: Soft. Bowel sounds are normal. She exhibits no distension and no mass. There is no tenderness. There is no rebound  and no guarding.  Neurological: She is alert and oriented to person, place, and time. She has normal reflexes.  Skin: Skin is warm and dry. No rash noted.  Psychiatric: Her speech is normal and behavior is normal. Judgment and thought content normal. She exhibits a depressed mood.     Labs reviewed: Office Visit on 08/05/2014  Component Date Value Ref Range Status  . Glucose 08/05/2014 82  65 - 99 mg/dL Final  . BUN 08/05/2014 14  8 - 27 mg/dL Final  . Creatinine, Ser 08/05/2014 0.69  0.57 - 1.00 mg/dL Final  . GFR calc non Af Amer 08/05/2014 80  >59 mL/min/1.73 Final  . GFR calc Af Amer 08/05/2014 92  >59 mL/min/1.73 Final  . BUN/Creatinine Ratio 08/05/2014 20  11 - 26 Final  . Sodium 08/05/2014 140  134 - 144 mmol/L Final  . Potassium 08/05/2014 4.1  3.5 - 5.2 mmol/L Final  . Chloride 08/05/2014 98  97 - 108 mmol/L Final  . CO2 08/05/2014 27  18 - 29 mmol/L Final  . Calcium 08/05/2014 9.4  8.7 - 10.3 mg/dL Final  . Total Protein 08/05/2014 6.6  6.0 - 8.5 g/dL Final  . Albumin 08/05/2014 4.2  3.5 - 4.7 g/dL Final  . Globulin, Total 08/05/2014 2.4  1.5 - 4.5 g/dL Final  . Albumin/Globulin Ratio 08/05/2014 1.8  1.1 - 2.5 Final  . Bilirubin Total 08/05/2014 0.5  0.0 - 1.2 mg/dL Final  . Alkaline Phosphatase 08/05/2014 69  39 - 117 IU/L Final  . AST 08/05/2014 20  0 - 40 IU/L Final  . ALT 08/05/2014 22  0 - 32 IU/L Final  . Cholesterol, Total 08/05/2014 210* 100 - 199 mg/dL Final  . Triglycerides 08/05/2014 94  0 - 149 mg/dL Final  . HDL 08/05/2014 72  >39 mg/dL Final   Comment: According to ATP-III Guidelines, HDL-C >59 mg/dL is considered  a negative risk factor for CHD.   Marland Kitchen VLDL Cholesterol Cal 08/05/2014 19  5 - 40 mg/dL Final  . LDL Calculated 08/05/2014 119* 0 - 99 mg/dL Final  . Chol/HDL Ratio 08/05/2014 2.9  0.0 - 4.4 ratio units Final   Comment:                                   T. Chol/HDL Ratio                                             Men  Women                               1/2 Avg.Risk  3.4    3.3                                   Avg.Risk  5.0    4.4                                2X Avg.Risk  9.6    7.1  3X Avg.Risk 23.4   11.0     No results found.   Assessment/Plan   ICD-9-CM ICD-10-CM   1. Change in stool 792.1 R19.5 CBC with Differential  2. Grief reaction - improving 309.0 F43.21 LORazepam (ATIVAN) 1 MG tablet  3. Depression with anxiety - unchanged 300.4 F41.8 LORazepam (ATIVAN) 1 MG tablet   --Continue current medications as ordered  --May take lorazepam up to 3 times daily  --she may benefit from psychotherapy   --may need GI eval  --Follow up in 1 month. Will call with lab results  [facility] S. Perlie Gold  [facility] [address] [place], [place] [address] [phone] Cell (Monday-Friday 8 AM - 5 PM) [phone] After 5 PM and follow prompts

## 2014-10-07 NOTE — Patient Instructions (Addendum)
Continue current medications as ordered  May take lorazepam up to 3 times daily  Follow up in 1 month. Will call with lab results

## 2014-10-08 LAB — CBC WITH DIFFERENTIAL/PLATELET
BASOS: 0 %
Basophils Absolute: 0 10*3/uL (ref 0.0–0.2)
EOS (ABSOLUTE): 0.2 10*3/uL (ref 0.0–0.4)
Eos: 3 %
Hematocrit: 38 % (ref 34.0–46.6)
Hemoglobin: 12.8 g/dL (ref 11.1–15.9)
IMMATURE GRANULOCYTES: 0 %
Immature Grans (Abs): 0 10*3/uL (ref 0.0–0.1)
Lymphocytes Absolute: 1.9 10*3/uL (ref 0.7–3.1)
Lymphs: 30 %
MCH: 30.3 pg (ref 26.6–33.0)
MCHC: 33.7 g/dL (ref 31.5–35.7)
MCV: 90 fL (ref 79–97)
Monocytes Absolute: 0.8 10*3/uL (ref 0.1–0.9)
Monocytes: 13 %
NEUTROS ABS: 3.4 10*3/uL (ref 1.4–7.0)
Neutrophils: 54 %
Platelets: 213 10*3/uL (ref 150–379)
RBC: 4.22 x10E6/uL (ref 3.77–5.28)
RDW: 13.4 % (ref 12.3–15.4)
WBC: 6.3 10*3/uL (ref 3.4–10.8)

## 2014-10-10 ENCOUNTER — Encounter: Payer: Self-pay | Admitting: *Deleted

## 2014-10-10 ENCOUNTER — Other Ambulatory Visit: Payer: Self-pay | Admitting: *Deleted

## 2014-10-10 ENCOUNTER — Other Ambulatory Visit: Payer: Medicare Other

## 2014-10-10 DIAGNOSIS — R195 Other fecal abnormalities: Secondary | ICD-10-CM | POA: Diagnosis not present

## 2014-10-10 NOTE — Addendum Note (Signed)
Addended by: Rafael Bihari A on: 10/10/2014 12:41 PM   Modules accepted: Orders

## 2014-10-11 LAB — FECAL OCCULT BLOOD, IMMUNOCHEMICAL: Fecal Occult Bld: NEGATIVE

## 2014-10-15 ENCOUNTER — Other Ambulatory Visit: Payer: Self-pay | Admitting: Internal Medicine

## 2014-10-22 ENCOUNTER — Other Ambulatory Visit: Payer: Self-pay | Admitting: Cardiology

## 2014-10-24 ENCOUNTER — Telehealth: Payer: Self-pay | Admitting: *Deleted

## 2014-10-24 NOTE — Telephone Encounter (Signed)
Recommend OTC mucinex per box instructions. If no improvement in next 48-72 hrs, she will need an OV

## 2014-10-24 NOTE — Telephone Encounter (Signed)
Patient called and stated that she has a cough. No relief from Robutussin DM. Would like suggestion. Feels like the cough is in her throat. Please Advise.

## 2014-10-25 NOTE — Telephone Encounter (Signed)
Patient notified and understood.

## 2014-11-04 ENCOUNTER — Ambulatory Visit: Payer: Medicare Other | Admitting: Internal Medicine

## 2014-11-04 DIAGNOSIS — N390 Urinary tract infection, site not specified: Secondary | ICD-10-CM | POA: Diagnosis not present

## 2014-11-05 ENCOUNTER — Emergency Department (HOSPITAL_COMMUNITY): Payer: Medicare Other

## 2014-11-05 ENCOUNTER — Encounter (HOSPITAL_COMMUNITY): Payer: Self-pay | Admitting: *Deleted

## 2014-11-05 ENCOUNTER — Emergency Department (HOSPITAL_COMMUNITY)
Admission: EM | Admit: 2014-11-05 | Discharge: 2014-11-05 | Disposition: A | Discharge status: Home / Self Care | Payer: Medicare Other | Attending: Emergency Medicine | Admitting: Emergency Medicine

## 2014-11-05 DIAGNOSIS — E785 Hyperlipidemia, unspecified: Secondary | ICD-10-CM | POA: Diagnosis not present

## 2014-11-05 DIAGNOSIS — Z7951 Long term (current) use of inhaled steroids: Secondary | ICD-10-CM | POA: Insufficient documentation

## 2014-11-05 DIAGNOSIS — Z8673 Personal history of transient ischemic attack (TIA), and cerebral infarction without residual deficits: Secondary | ICD-10-CM | POA: Diagnosis not present

## 2014-11-05 DIAGNOSIS — I2510 Atherosclerotic heart disease of native coronary artery without angina pectoris: Secondary | ICD-10-CM | POA: Diagnosis not present

## 2014-11-05 DIAGNOSIS — R079 Chest pain, unspecified: Secondary | ICD-10-CM | POA: Diagnosis present

## 2014-11-05 DIAGNOSIS — Z7902 Long term (current) use of antithrombotics/antiplatelets: Secondary | ICD-10-CM | POA: Diagnosis not present

## 2014-11-05 DIAGNOSIS — Z79899 Other long term (current) drug therapy: Secondary | ICD-10-CM | POA: Diagnosis not present

## 2014-11-05 DIAGNOSIS — Z880 Allergy status to penicillin: Secondary | ICD-10-CM | POA: Insufficient documentation

## 2014-11-05 DIAGNOSIS — M1990 Unspecified osteoarthritis, unspecified site: Secondary | ICD-10-CM | POA: Insufficient documentation

## 2014-11-05 DIAGNOSIS — R0789 Other chest pain: Secondary | ICD-10-CM | POA: Diagnosis not present

## 2014-11-05 DIAGNOSIS — K219 Gastro-esophageal reflux disease without esophagitis: Secondary | ICD-10-CM | POA: Diagnosis not present

## 2014-11-05 DIAGNOSIS — F419 Anxiety disorder, unspecified: Secondary | ICD-10-CM | POA: Insufficient documentation

## 2014-11-05 DIAGNOSIS — Z862 Personal history of diseases of the blood and blood-forming organs and certain disorders involving the immune mechanism: Secondary | ICD-10-CM | POA: Insufficient documentation

## 2014-11-05 DIAGNOSIS — G43119 Migraine with aura, intractable, without status migrainosus: Secondary | ICD-10-CM | POA: Diagnosis not present

## 2014-11-05 DIAGNOSIS — I10 Essential (primary) hypertension: Secondary | ICD-10-CM | POA: Diagnosis not present

## 2014-11-05 LAB — BASIC METABOLIC PANEL
Anion gap: 11 (ref 5–15)
BUN: 17 mg/dL (ref 6–20)
CO2: 25 mmol/L (ref 22–32)
CREATININE: 0.77 mg/dL (ref 0.44–1.00)
Calcium: 9.1 mg/dL (ref 8.9–10.3)
Chloride: 101 mmol/L (ref 101–111)
GFR calc Af Amer: >60 mL/min (ref >60)
Glucose, Bld: 151 mg/dL — ABNORMAL HIGH (ref 65–99)
Potassium: 3.4 mmol/L — ABNORMAL LOW (ref 3.5–5.1)
Sodium: 137 mmol/L (ref 135–145)

## 2014-11-05 LAB — I-STAT TROPONIN, ED: TROPONIN I, POC: 0.01 ng/mL (ref 0.00–0.08)

## 2014-11-05 LAB — CBC
HEMATOCRIT: 41.3 % (ref 36.0–46.0)
Hemoglobin: 13.2 g/dL (ref 12.0–15.0)
MCH: 30.1 pg (ref 26.0–34.0)
MCHC: 32 g/dL (ref 30.0–36.0)
MCV: 94.1 fL (ref 78.0–100.0)
Platelets: 179 10*3/uL (ref 150–400)
RBC: 4.39 MIL/uL (ref 3.87–5.11)
RDW: 13.4 % (ref 11.5–15.5)
WBC: 15.9 10*3/uL — AB (ref 4.0–10.5)

## 2014-11-05 LAB — TROPONIN I: Troponin I: <0.03 ng/mL (ref <0.031)

## 2014-11-05 LAB — LIPASE, BLOOD: LIPASE: 29 U/L (ref 22–51)

## 2014-11-05 MED ORDER — GI COCKTAIL ~~LOC~~
30.0000 mL | Freq: Once | ORAL | Status: AC
  Administered 2014-11-05: 30 mL via ORAL
  Filled 2014-11-05: qty 30

## 2014-11-05 MED ORDER — SODIUM CHLORIDE 0.9 % IV SOLN
INTRAVENOUS | Status: DC
  Administered 2014-11-05: 10 mL/h via INTRAVENOUS

## 2014-11-05 MED ORDER — FAMOTIDINE 20 MG PO TABS
40.0000 mg | ORAL_TABLET | Freq: Once | ORAL | Status: AC
  Administered 2014-11-05: 40 mg via ORAL
  Filled 2014-11-05: qty 2

## 2014-11-05 MED ORDER — SUCRALFATE 1 G PO TABS: 1.0000 g | ORAL_TABLET | Freq: Four times a day (QID) | ORAL | Status: DC

## 2014-11-05 NOTE — ED Notes (Signed)
Pt in from home via [facility], per report pt c/o mid non radiating CP onset today, pt denies SOB, c/o nausea, denies vomiting & diarrhea, hx of a bleeding ulcer, pt takes Eliquis, pt currently being treated for a UTI, Macrobid started yesterday, A&O x4, follows commands, speaks in complete sentences, pt recently lost her spouse of 14 yrs & daughter has breast CA, pt reports feeling depressed

## 2014-11-05 NOTE — ED Provider Notes (Signed)
CSN: [id number]     Arrival date & time 11/05/14  1632 History   First MD Initiated Contact with Patient 11/05/14 1635     Chief Complaint  Patient presents with  . Abdominal Pain  . Chest Pain     (Consider location/radiation/quality/duration/timing/severity/associated sxs/prior Treatment) HPI Comments: Pt states that it feels like gas  Patient is a 81 y.o. female presenting with abdominal pain and chest pain. The history is provided by the patient.  Abdominal Pain Pain location:  Epigastric Pain quality: bloating   Pain severity:  Mild Onset quality:  Sudden Duration:  3 hours Timing:  Constant Progression:  Unchanged Chronicity:  Recurrent Worsened by:  Nothing tried Ineffective treatments:  None tried Associated symptoms: chest pain, flatus and nausea   Associated symptoms: no cough, no diarrhea, no fever and no vomiting   Chest Pain Associated symptoms: abdominal pain and nausea   Associated symptoms: no cough, no fever and not vomiting     Past Medical History  Diagnosis Date  . BACK PAIN, LUMBAR 09/27/2009  . CORONARY ARTERY DISEASE     a. s/p remote CABG 1997, 3V.;  b. LexiScan Myoview (8/15): No ischemia, EF 72%, normal study  . DIVERTICULITIS, HX OF 11/25/2006  . FATIGUE 07/21/2008  . GERD 11/25/2006  . HYPERLIPIDEMIA   . HYPERTENSION   . IBS 12/07/2008  . MIGRAINE WITH AURA 08/13/2007  . Muscle weakness (generalized) 09/27/2009  . OSTEOARTHRITIS 08/13/2007  . TRANSIENT ISCHEMIC ATTACK 04/13/2007  . Hiatal hernia   . Paroxysmal atrial fibrillation Dx 02/2012  . Anxiety   . CVA (cerebral infarction)     a. Thalamic infarct 09/2009.  Marland Kitchen Anemia   . GI bleed     a. 09/2009: secondary to antral ulcer.  . Mild carotid artery disease     a. 0-39% bilat 05/2011 (f/u recommended 05/2013).  . Gastric ulcer   . Heart attack   . Acute fibrinous pericarditis    Past Surgical History  Procedure Laterality Date  . Tonsillectomy    . Abdominal hysterectomy  1973  . Coronary  artery bypass graft  1997    Dr Melvenia Needles  . Coronary angioplasty  1997    Dr Lia Foyer   Family History  Problem Relation Age of Onset  . Heart attack Mother   . Heart failure Father   . Throat cancer Sister   . Heart disease Sister   . Coronary artery disease Sister   . Colon cancer Neg Hx   . Diabetes Sister    History  Substance Use Topics  . Smoking status: Never Smoker   . Smokeless tobacco: Never Used  . Alcohol Use: No   OB History    No data available     Review of Systems  Constitutional: Negative for fever.  Respiratory: Negative for cough.   Cardiovascular: Positive for chest pain.  Gastrointestinal: Positive for nausea, abdominal pain and flatus. Negative for vomiting and diarrhea.  All other systems reviewed and are negative.     Allergies  Hydrocodone; Ibuprofen; Oxycodone hcl; Prednisone; Statins; and Penicillins  Home Medications   Prior to Admission medications   Medication Sig Start Date End Date Taking? Authorizing Provider  acetaminophen (TYLENOL) 500 MG tablet Take 1,000 mg by mouth every 6 (six) hours as needed (for pain).     Historical Provider, MD  amiodarone (PACERONE) 200 MG tablet TAKE ONE-HALF TABLET DAILY 08/08/14   Gildardo Cranker, DO  apixaban (ELIQUIS) 5 MG TABS tablet Take 1 tablet (5  mg total) by mouth 2 (two) times daily. 04/04/14   Larey Dresser, MD  ELIQUIS 5 MG TABS tablet TAKE ONE TABLET TWICE DAILY 10/24/14   Larey Dresser, MD  estradiol (ESTRACE) 0.1 MG/GM vaginal cream Place 1 Applicatorful vaginally 3 (three) times a week.    Historical Provider, MD  Flaxseed, Linseed, (FLAXSEED OIL) 1000 MG CAPS Take 1,000 mg by mouth at bedtime.     Historical Provider, MD  fluticasone (FLONASE) 50 MCG/ACT nasal spray Place 2 sprays into both nostrils daily.    Historical Provider, MD  LORazepam (ATIVAN) 1 MG tablet Take 1 tablet (1 mg total) by mouth every 8 (eight) hours as needed for anxiety. 10/07/14   Gildardo Cranker, DO  losartan (COZAAR)  50 MG tablet Take 50 mg by mouth 2 (two) times daily. 11/12/13   Liliane Shi, PA-C  metoprolol succinate (TOPROL-XL) 25 MG 24 hr tablet TAKE ONE-HALF TABLET DAILY 09/05/14   Gildardo Cranker, DO  Multiple Vitamin (MULTIVITAMIN) tablet Take 1 tablet by mouth daily.      Historical Provider, MD  niacin 500 MG CR capsule Take 500 mg by mouth 2 (two) times daily.     Historical Provider, MD  Omega-3 Fatty Acids (FISH OIL) 1000 MG CAPS Take 1,000 mg by mouth 2 (two) times daily.     Historical Provider, MD  pantoprazole (PROTONIX) 40 MG tablet TAKE ONE TABLET TWICE DAILY 03/07/14   Blanchie Serve, MD  vitamin C (ASCORBIC ACID) 500 MG tablet Take 500 mg by mouth daily.      Historical Provider, MD  zolpidem (AMBIEN) 10 MG tablet ONE TABLET AT BEDTIME FOR REST 10/17/14   Tiffany L Reed, DO   There were no vitals taken for this visit. Physical Exam  Constitutional: She is oriented to person, place, and time. She appears well-developed and well-nourished.  Non-toxic appearance. No distress.  HENT:  Head: Normocephalic and atraumatic.  Eyes: Conjunctivae, EOM and lids are normal. Pupils are equal, round, and reactive to light.  Neck: Normal range of motion. Neck supple. No tracheal deviation present. No thyroid mass present.  Cardiovascular: Normal rate, regular rhythm and normal heart sounds.  Exam reveals no gallop.   No murmur heard. Pulmonary/Chest: Effort normal and breath sounds normal. No stridor. No respiratory distress. She has no decreased breath sounds. She has no wheezes. She has no rhonchi. She has no rales.  Abdominal: Soft. Normal appearance and bowel sounds are normal. She exhibits no distension. There is tenderness in the epigastric area. There is no rigidity, no rebound, no guarding and no CVA tenderness.    Musculoskeletal: Normal range of motion. She exhibits no edema or tenderness.  Neurological: She is alert and oriented to person, place, and time. She has normal strength. No cranial  nerve deficit or sensory deficit. GCS eye subscore is 4. GCS verbal subscore is 5. GCS motor subscore is 6.  Skin: Skin is warm and dry. No abrasion and no rash noted.  Psychiatric: Her speech is normal and behavior is normal. She exhibits a depressed mood.  Nursing note and vitals reviewed.   ED Course  Procedures (including critical care time) Labs Review Labs Reviewed - No data to display  Imaging Review No results found.   EKG Interpretation   Date/Time:  Saturday November 05 2014 16:54:08 EDT Ventricular Rate:  75 PR Interval:  195 QRS Duration: 85 QT Interval:  422 QTC Calculation: 471 R Axis:   33 Text Interpretation:  Sinus rhythm Nonspecific  repol abnormality, diffuse  leads No significant change since last tracing Confirmed by Marlene Beidler  MD,  Adriene Padula ([id number]) on 11/05/2014 5:41:21 PM      MDM   Final diagnoses:  None    Patient given GI cocktail with Pepcid and feels better. Suspect that this is GERD related and not ACS. Will discharge home    Lacretia Leigh, MD 11/05/14 [phone]

## 2014-11-05 NOTE — Discharge Instructions (Signed)
Chest Pain (Nonspecific) °It is often hard to give a specific diagnosis for the cause of chest pain. There is always a chance that your pain could be related to something serious, such as a heart attack or a blood clot in the lungs. You need to follow up with your health care provider for further evaluation. °CAUSES  °· Heartburn. °· Pneumonia or bronchitis. °· Anxiety or stress. °· Inflammation around your heart (pericarditis) or lung (pleuritis or pleurisy). °· A blood clot in the lung. °· A collapsed lung (pneumothorax). It can develop suddenly on its own (spontaneous pneumothorax) or from trauma to the chest. °· Shingles infection (herpes zoster virus). °The chest wall is composed of bones, muscles, and cartilage. Any of these can be the source of the pain. °· The bones can be bruised by injury. °· The muscles or cartilage can be strained by coughing or overwork. °· The cartilage can be affected by inflammation and become sore (costochondritis). °DIAGNOSIS  °Lab tests or other studies may be needed to find the cause of your pain. Your health care provider may have you take a test called an ambulatory electrocardiogram (ECG). An ECG records your heartbeat patterns over a 24-hour period. You may also have other tests, such as: °· Transthoracic echocardiogram (TTE). During echocardiography, sound waves are used to evaluate how blood flows through your heart. °· Transesophageal echocardiogram (TEE). °· Cardiac monitoring. This allows your health care provider to monitor your heart rate and rhythm in real time. °· Holter monitor. This is a portable device that records your heartbeat and can help diagnose heart arrhythmias. It allows your health care provider to track your heart activity for several days, if needed. °· Stress tests by exercise or by giving medicine that makes the heart beat faster. °TREATMENT  °· Treatment depends on what may be causing your chest pain. Treatment may include: °· Acid blockers for  heartburn. °· Anti-inflammatory medicine. °· Pain medicine for inflammatory conditions. °· Antibiotics if an infection is present. °· You may be advised to change lifestyle habits. This includes stopping smoking and avoiding alcohol, caffeine, and chocolate. °· You may be advised to keep your head raised (elevated) when sleeping. This reduces the chance of acid going backward from your stomach into your esophagus. °Most of the time, nonspecific chest pain will improve within 2-3 days with rest and mild pain medicine.  °HOME CARE INSTRUCTIONS  °· If antibiotics were prescribed, take them as directed. Finish them even if you start to feel better. °· For the next few days, avoid physical activities that bring on chest pain. Continue physical activities as directed. °· Do not use any tobacco products, including cigarettes, chewing tobacco, or electronic cigarettes. °· Avoid drinking alcohol. °· Only take medicine as directed by your health care provider. °· Follow your health care provider's suggestions for further testing if your chest pain does not go away. °· Keep any follow-up appointments you made. If you do not go to an appointment, you could develop lasting (chronic) problems with pain. If there is any problem keeping an appointment, call to reschedule. °SEEK MEDICAL CARE IF:  °· Your chest pain does not go away, even after treatment. °· You have a rash with blisters on your chest. °· You have a fever. °SEEK IMMEDIATE MEDICAL CARE IF:  °· You have increased chest pain or pain that spreads to your arm, neck, jaw, back, or abdomen. °· You have shortness of breath. °· You have an increasing cough, or you cough   up blood. °· You have severe back or abdominal pain. °· You feel nauseous or vomit. °· You have severe weakness. °· You faint. °· You have chills. °This is an emergency. Do not wait to see if the pain will go away. Get medical help at once. Call your local emergency services (911 in [place] Do not drive  yourself to the hospital. °MAKE SURE YOU:  °· Understand these instructions. °· Will watch your condition. °· Will get help right away if you are not doing well or get worse. °Document Released: 01/23/2005 Document Revised: 04/20/2013 Document Reviewed: 11/19/2007 °[employer]® Patient Information ©2015 [employer]. This information is not intended to replace advice given to you by your health care provider. Make sure you discuss any questions you have with your health care provider. °Gastroesophageal Reflux Disease, Adult °Gastroesophageal reflux disease (GERD) happens when acid from your stomach flows up into the esophagus. When acid comes in contact with the esophagus, the acid causes soreness (inflammation) in the esophagus. Over time, GERD may create small holes (ulcers) in the lining of the esophagus. °CAUSES  °· Increased body weight. This puts pressure on the stomach, making acid rise from the stomach into the esophagus. °· Smoking. This increases acid production in the stomach. °· Drinking alcohol. This causes decreased pressure in the lower esophageal sphincter (valve or ring of muscle between the esophagus and stomach), allowing acid from the stomach into the esophagus. °· Late evening meals and a full stomach. This increases pressure and acid production in the stomach. °· A malformed lower esophageal sphincter. °Sometimes, no cause is found. °SYMPTOMS  °· Burning pain in the lower part of the mid-chest behind the breastbone and in the mid-stomach area. This may occur twice a week or more often. °· Trouble swallowing. °· Sore throat. °· Dry cough. °· Asthma-like symptoms including chest tightness, shortness of breath, or wheezing. °DIAGNOSIS  °Your caregiver may be able to diagnose GERD based on your symptoms. In some cases, X-rays and other tests may be done to check for complications or to check the condition of your stomach and esophagus. °TREATMENT  °Your caregiver may recommend over-the-counter or  prescription medicines to help decrease acid production. Ask your caregiver before starting or adding any new medicines.  °HOME CARE INSTRUCTIONS  °· Change the factors that you can control. Ask your caregiver for guidance concerning weight loss, quitting smoking, and alcohol consumption. °· Avoid foods and drinks that make your symptoms worse, such as: °¨ Caffeine or alcoholic drinks. °¨ Chocolate. °¨ Peppermint or mint flavorings. °¨ Garlic and onions. °¨ Spicy foods. °¨ Citrus fruits, such as oranges, lemons, or limes. °¨ Tomato-based foods such as sauce, chili, salsa, and pizza. °¨ Fried and fatty foods. °· Avoid lying down for the 3 hours prior to your bedtime or prior to taking a nap. °· Eat small, frequent meals instead of large meals. °· Wear loose-fitting clothing. Do not wear anything tight around your waist that causes pressure on your stomach. °· Raise the head of your bed 6 to 8 inches with wood blocks to help you sleep. Extra pillows will not help. °· Only take over-the-counter or prescription medicines for pain, discomfort, or fever as directed by your caregiver. °· Do not take aspirin, ibuprofen, or other nonsteroidal anti-inflammatory drugs (NSAIDs). °SEEK IMMEDIATE MEDICAL CARE IF:  °· You have pain in your arms, neck, jaw, teeth, or back. °· Your pain increases or changes in intensity or duration. °· You develop nausea, vomiting, or sweating (diaphoresis). °·   You develop shortness of breath, or you faint. °· Your vomit is green, yellow, black, or looks like coffee grounds or blood. °· Your stool is red, bloody, or black. °These symptoms could be signs of other problems, such as heart disease, gastric bleeding, or esophageal bleeding. °MAKE SURE YOU:  °· Understand these instructions. °· Will watch your condition. °· Will get help right away if you are not doing well or get worse. °Document Released: 01/23/2005 Document Revised: 07/08/2011 Document Reviewed: 11/02/2010 °[employer]® Patient  Information ©2015 [employer]. This information is not intended to replace advice given to you by your health care provider. Make sure you discuss any questions you have with your health care provider. ° °

## 2014-11-09 ENCOUNTER — Telehealth: Payer: Self-pay | Admitting: *Deleted

## 2014-11-09 ENCOUNTER — Ambulatory Visit (INDEPENDENT_AMBULATORY_CARE_PROVIDER_SITE_OTHER): Payer: Medicare Other | Admitting: Internal Medicine

## 2014-11-09 ENCOUNTER — Encounter: Payer: Self-pay | Admitting: Internal Medicine

## 2014-11-09 VITALS — BP 136/80 | HR 60 | Temp 98.7°F | Resp 18 | Ht 68.0 in | Wt 148.4 lb

## 2014-11-09 DIAGNOSIS — G4700 Insomnia, unspecified: Secondary | ICD-10-CM | POA: Diagnosis not present

## 2014-11-09 DIAGNOSIS — F4321 Adjustment disorder with depressed mood: Secondary | ICD-10-CM | POA: Diagnosis not present

## 2014-11-09 DIAGNOSIS — N390 Urinary tract infection, site not specified: Secondary | ICD-10-CM

## 2014-11-09 DIAGNOSIS — K219 Gastro-esophageal reflux disease without esophagitis: Secondary | ICD-10-CM

## 2014-11-09 DIAGNOSIS — R319 Hematuria, unspecified: Secondary | ICD-10-CM | POA: Diagnosis not present

## 2014-11-09 DIAGNOSIS — F329 Major depressive disorder, single episode, unspecified: Secondary | ICD-10-CM

## 2014-11-09 DIAGNOSIS — F32A Depression, unspecified: Secondary | ICD-10-CM

## 2014-11-09 DIAGNOSIS — R1011 Right upper quadrant pain: Secondary | ICD-10-CM

## 2014-11-09 DIAGNOSIS — F418 Other specified anxiety disorders: Secondary | ICD-10-CM | POA: Diagnosis not present

## 2014-11-09 LAB — POCT URINALYSIS DIPSTICK
Glucose, UA: NEGATIVE
Nitrite, UA: NEGATIVE
Protein, UA: 30
SPEC GRAV UA: 1.015
UROBILINOGEN UA: NEGATIVE
pH, UA: 5

## 2014-11-09 MED ORDER — ZOLPIDEM TARTRATE 10 MG PO TABS: ORAL_TABLET | ORAL | Status: DC

## 2014-11-09 MED ORDER — DESVENLAFAXINE SUCCINATE ER 25 MG PO TB24: 25.0000 mg | ORAL_TABLET | Freq: Every day | ORAL | Status: DC

## 2014-11-09 NOTE — Telephone Encounter (Signed)
Patient called and left message stating that the Depression Medication just prescribed cost too much $147.00. Would like something cheaper. Please Advise.

## 2014-11-09 NOTE — Telephone Encounter (Signed)
Noted. rx remeron 15mg  #30 take 1 po qhs to help mood and sleep with 4 RF. D/c pristiq

## 2014-11-09 NOTE — Progress Notes (Signed)
Patient ID: Marissa Bowen, female   DOB: 26-Aug-1926, 81 y.o.   MRN: [id number]    Location:    [facility]   Place of Service:   OFFICE  Chief Complaint  Patient presents with  . Follow-up    c/o weakness, stomach soreness and tongue turning brown; c/a UTI    HPI:  81 yo female seen today for f/u of grief reaction. She was seen in the ED earlier this week for CP, abdominal pain. She was tx with GI cocktail and released. CE neg. She continues to lose weight and is down 2 lbs since last OV. She was started on nitrofurantoin for 10 days by GYN for UTI on 7/8th. ER Rx carafate which is helping. Urine dipped in office today reveals 2+ bilirubin, 30 protein, trace blood and trace ketones. No UTI.  She is still depressed and does not have anyone to talk to. She is grieving her now deceased spouse. She does not desire to go to group therapy. She has increased crying, anhedonia, sleep difficulties, change in appetite, weight loss. It has been 8 mos  Past Medical History  Diagnosis Date  . BACK PAIN, LUMBAR 09/27/2009  . CORONARY ARTERY DISEASE     a. s/p remote CABG 1997, 3V.;  b. LexiScan Myoview (8/15): No ischemia, EF 72%, normal study  . DIVERTICULITIS, HX OF 11/25/2006  . FATIGUE 07/21/2008  . GERD 11/25/2006  . HYPERLIPIDEMIA   . HYPERTENSION   . IBS 12/07/2008  . MIGRAINE WITH AURA 08/13/2007  . Muscle weakness (generalized) 09/27/2009  . OSTEOARTHRITIS 08/13/2007  . TRANSIENT ISCHEMIC ATTACK 04/13/2007  . Hiatal hernia   . Paroxysmal atrial fibrillation Dx 02/2012  . Anxiety   . CVA (cerebral infarction)     a. Thalamic infarct 09/2009.  Marland Kitchen Anemia   . GI bleed     a. 09/2009: secondary to antral ulcer.  . Mild carotid artery disease     a. 0-39% bilat 05/2011 (f/u recommended 05/2013).  . Gastric ulcer   . Heart attack   . Acute fibrinous pericarditis     Past Surgical History  Procedure Laterality Date  . Tonsillectomy    . Abdominal hysterectomy  1973  . Coronary artery bypass graft   1997    Dr Melvenia Needles  . Coronary angioplasty  1997    Dr Lia Foyer    Patient Care Team: Gildardo Cranker, DO as PCP - General (Internal Medicine) Hillary Bow, MD (Cardiology) Melissa Montane, MD as Consulting Physician (Otolaryngology) Arvella Nigh, MD as Consulting Physician (Obstetrics and Gynecology) Rutherford Guys, MD as Consulting Physician (Ophthalmology)  History   Social History  . Marital Status: Married    Spouse Name: N/A  . Number of Children: N/A  . Years of Education: N/A   Occupational History  . Not on file.   Social History Main Topics  . Smoking status: Never Smoker   . Smokeless tobacco: Never Used  . Alcohol Use: No  . Drug Use: No  . Sexual Activity: No   Other Topics Concern  . Not on file   Social History Narrative     reports that she has never smoked. She has never used smokeless tobacco. She reports that she does not drink alcohol or use illicit drugs.  Allergies  Allergen Reactions  . Hydrocodone Other (See Comments)    REACTION: migraines  . Ibuprofen Other (See Comments)    ulcers  . Oxycodone Hcl Other (See Comments)    REACTION: migraines  .  Penicillins Swelling and Rash  . Prednisone Other (See Comments)    ulcers  . Statins Other (See Comments)    Aches and pains    Medications: Patient's Medications  New Prescriptions   DESVENLAFAXINE SUCCINATE ER (PRISTIQ) 25 MG TB24    Take 25 mg by mouth daily.  Previous Medications   ACETAMINOPHEN (TYLENOL) 500 MG TABLET    Take 500 mg by mouth every 6 (six) hours as needed for moderate pain (for pain).    AMIODARONE (PACERONE) 200 MG TABLET    TAKE ONE-HALF TABLET DAILY   ELIQUIS 5 MG TABS TABLET    TAKE ONE TABLET TWICE DAILY   FLAXSEED, LINSEED, (FLAXSEED OIL) 1000 MG CAPS    Take 1,000 mg by mouth at bedtime.    FLUTICASONE (FLONASE) 50 MCG/ACT NASAL SPRAY    Place 2 sprays into both nostrils daily.   LORAZEPAM (ATIVAN) 1 MG TABLET    Take 1 tablet (1 mg total) by mouth every 8 (eight)  hours as needed for anxiety.   LOSARTAN (COZAAR) 50 MG TABLET    Take 50 mg by mouth 2 (two) times daily.   METOPROLOL SUCCINATE (TOPROL-XL) 25 MG 24 HR TABLET    TAKE ONE-HALF TABLET DAILY   MULTIPLE VITAMIN (MULTIVITAMIN) TABLET    Take 1 tablet by mouth daily.     NIACIN 500 MG CR CAPSULE    Take 500 mg by mouth 2 (two) times daily.    NITROFURANTOIN, MACROCRYSTAL-MONOHYDRATE, (MACROBID) 100 MG CAPSULE    Take 100 mg by mouth 2 (two) times daily. FOR 7 DAYS ENDING 11/11/14   OMEGA-3 FATTY ACIDS (FISH OIL) 1000 MG CAPS    Take 1,000 mg by mouth 2 (two) times daily.    PANTOPRAZOLE (PROTONIX) 40 MG TABLET    TAKE ONE TABLET TWICE DAILY   SUCRALFATE (CARAFATE) 1 G TABLET    Take 1 tablet (1 g total) by mouth 4 (four) times daily.  Modified Medications   Modified Medication Previous Medication   ZOLPIDEM (AMBIEN) 10 MG TABLET zolpidem (AMBIEN) 10 MG tablet      ONE TABLET AT BEDTIME FOR REST    ONE TABLET AT BEDTIME FOR REST  Discontinued Medications   APIXABAN (ELIQUIS) 5 MG TABS TABLET    Take 1 tablet (5 mg total) by mouth 2 (two) times daily.   ESTRADIOL (ESTRACE) 0.1 MG/GM VAGINAL CREAM    Place 1 Applicatorful vaginally 3 (three) times a week.    Review of Systems  Constitutional: Positive for appetite change and fatigue. Negative for fever, chills, diaphoresis and activity change.  HENT: Negative for ear pain, sore throat and trouble swallowing.   Eyes: Negative for visual disturbance.  Respiratory: Negative for cough, chest tightness and shortness of breath.   Cardiovascular: Negative for chest pain, palpitations and leg swelling.  Gastrointestinal: Positive for nausea, abdominal pain and constipation. Negative for vomiting, diarrhea and blood in stool.  Genitourinary: Negative for dysuria.  Musculoskeletal: Negative for arthralgias.  Neurological: Negative for dizziness, tremors, numbness and headaches.  Psychiatric/Behavioral: Positive for dysphoric mood. Negative for sleep  disturbance. The patient is nervous/anxious.     Filed Vitals:   11/09/14 1410  BP: 136/80  Pulse: 60  Temp: 98.7 F (37.1 C)  TempSrc: Oral  Resp: 18  Height: 5' 8"  (1.727 m)  Weight: 148 lb 6.4 oz (67.314 kg)  SpO2: 97%   Body mass index is 22.57 kg/(m^2).  Physical Exam  Cardiovascular: Regular rhythm, intact distal pulses and normal  pulses.  Bradycardia present.  Exam reveals no gallop and no friction rub.   Murmur (1/6 SEM) heard. No distal swelling. No calf TTP  Pulmonary/Chest: Effort normal. No respiratory distress. She has no wheezes. She has no rhonchi. She has no rales.  Abdominal: Soft. Normal appearance and bowel sounds are normal. She exhibits no abdominal bruit. There is no hepatomegaly. There is tenderness in the right upper quadrant and epigastric area. There is rebound and positive Murphy's sign. There is no rigidity and no guarding.     Labs reviewed:  Urinalysis    Component Value Date/Time   COLORURINE YELLOW 08/25/2012 [place] 08/25/2012 1029   LABSPEC 1.010 01/12/2013 1618   PHURINE 5.5 01/12/2013 1618   GLUCOSEU NEGATIVE 01/12/2013 1618   HGBUR NEGATIVE 01/12/2013 1618   BILIRUBINUR 2+ 11/09/2014 1429   BILIRUBINUR NEGATIVE 01/12/2013 1618   KETONESUR NEGATIVE 01/12/2013 1618   PROTEINUR 30 11/09/2014 1429   PROTEINUR NEGATIVE 01/12/2013 1618   UROBILINOGEN negative 11/09/2014 1429   UROBILINOGEN 0.2 01/12/2013 1618   NITRITE negative 11/09/2014 1429   NITRITE NEGATIVE 01/12/2013 1618   LEUKOCYTESUR moderate (2+)* 11/09/2014 1429      Admission on 11/05/2014, Discharged on 11/05/2014  Component Date Value Ref Range Status  . WBC 11/05/2014 15.9* 4.0 - 10.5 K/uL Final  . RBC 11/05/2014 4.39  3.87 - 5.11 MIL/uL Final  . Hemoglobin 11/05/2014 13.2  12.0 - 15.0 g/dL Final  . HCT 11/05/2014 41.3  36.0 - 46.0 % Final  . MCV 11/05/2014 94.1  78.0 - 100.0 fL Final  . MCH 11/05/2014 30.1  26.0 - 34.0 pg Final  . MCHC  11/05/2014 32.0  30.0 - 36.0 g/dL Final  . RDW 11/05/2014 13.4  11.5 - 15.5 % Final  . Platelets 11/05/2014 179  150 - 400 K/uL Final  . Sodium 11/05/2014 137  135 - 145 mmol/L Final  . Potassium 11/05/2014 3.4* 3.5 - 5.1 mmol/L Final  . Chloride 11/05/2014 101  101 - 111 mmol/L Final  . CO2 11/05/2014 25  22 - 32 mmol/L Final  . Glucose, Bld 11/05/2014 151* 65 - 99 mg/dL Final  . BUN 11/05/2014 17  6 - 20 mg/dL Final  . Creatinine, Ser 11/05/2014 0.77  0.44 - 1.00 mg/dL Final  . Calcium 11/05/2014 9.1  8.9 - 10.3 mg/dL Final  . GFR calc non Af Amer 11/05/2014 >60  >60 mL/min Final  . GFR calc Af Amer 11/05/2014 >60  >60 mL/min Final   Comment: (NOTE) The eGFR has been calculated using the CKD EPI equation. This calculation has not been validated in all clinical situations. eGFR's persistently <60 mL/min signify possible Chronic Kidney Disease.   . Anion gap 11/05/2014 11  5 - 15 Final  . Troponin i, poc 11/05/2014 0.01  0.00 - 0.08 ng/mL Final  . Comment 3 11/05/2014          Final   Comment: Due to the release kinetics of cTnI, a negative result within the first hours of the onset of symptoms does not rule out myocardial infarction with certainty. If myocardial infarction is still suspected, repeat the test at appropriate intervals.   . Lipase 11/05/2014 29  22 - 51 U/L Final  . Troponin I 11/05/2014 <0.03  <0.031 ng/mL Final   Comment:        NO INDICATION OF MYOCARDIAL INJURY.   Appointment on 10/10/2014  Component Date Value Ref Range Status  . Fecal Occult Bld 10/10/2014 Negative  Negative Final  Office Visit on 10/07/2014  Component Date Value Ref Range Status  . WBC 10/07/2014 6.3  3.4 - 10.8 x10E3/uL Final  . RBC 10/07/2014 4.22  3.77 - 5.28 x10E6/uL Final  . Hemoglobin 10/07/2014 12.8  11.1 - 15.9 g/dL Final  . Hematocrit 10/07/2014 38.0  34.0 - 46.6 % Final  . MCV 10/07/2014 90  79 - 97 fL Final  . MCH 10/07/2014 30.3  26.6 - 33.0 pg Final  . MCHC 10/07/2014  33.7  31.5 - 35.7 g/dL Final  . RDW 10/07/2014 13.4  12.3 - 15.4 % Final  . Platelets 10/07/2014 213  150 - 379 x10E3/uL Final  . Neutrophils 10/07/2014 54   Final  . Lymphs 10/07/2014 30   Final  . Monocytes 10/07/2014 13   Final  . Eos 10/07/2014 3   Final  . Basos 10/07/2014 0   Final  . Neutrophils Absolute 10/07/2014 3.4  1.4 - 7.0 x10E3/uL Final  . Lymphocytes Absolute 10/07/2014 1.9  0.7 - 3.1 x10E3/uL Final  . Monocytes Absolute 10/07/2014 0.8  0.1 - 0.9 x10E3/uL Final  . EOS (ABSOLUTE) 10/07/2014 0.2  0.0 - 0.4 x10E3/uL Final  . Basophils Absolute 10/07/2014 0.0  0.0 - 0.2 x10E3/uL Final  . Immature Granulocytes 10/07/2014 0   Final  . Immature Grans (Abs) 10/07/2014 0.0  0.0 - 0.1 x10E3/uL Final    Dg Chest 2 View  11/05/2014   CLINICAL DATA:  Mid sternal chest pain and nausea. History of myocardial infarction and hypertension.  EXAM: CHEST  2 VIEW  COMPARISON:  10/30/2013  FINDINGS: Mild enlargement of the cardiopericardial silhouette without edema. Prior CABG. Atherosclerotic calcification of the aortic arch.  Mild thoracic spondylosis.  Minimal scarring at the left lung base.  IMPRESSION: 1. Mild cardiomegaly, without edema. 2. Minimal scarring at the left lung base. 3. Prior CABG. 4. Atherosclerotic aortic arch.   Electronically Signed   By: Van Clines M.D.   On: 11/05/2014 17:28     Assessment/Plan   ICD-9-CM ICD-10-CM   1. Depression - uncontrolled 311 F32.9 Desvenlafaxine Succinate ER (PRISTIQ) 25 MG TB24  2. Urinary tract infection with hematuria, site unspecified - resolving 599.0 N39.0 POC Urinalysis Dipstick    R31.9   3. RUQ abdominal pain - r/o gallbladder disease in light of bilirubinuria 789.01 R10.11 US Abdomen Limited RUQ  4. Gastroesophageal reflux disease without esophagitis - on carafate and protonix 530.81 K21.9 US Abdomen Limited RUQ  5. Grief reaction - unchanged 309.0 F43.21   6. Depression with anxiety - uncontrolled 300.4 F41.8   7. Insomnia -  stable 780.52 G47.00 zolpidem (AMBIEN) 10 MG tablet   --Recommend taking pristiq at bedtime  --Continue lorazepam as needed  --Continue other medications as ordered  --Will call with imaging appt  --Follow up in 1 mo for re-eval  Shanigua Gibb S. Perlie Gold  [facility] [address] [place], [place] [address] [phone] Cell (Monday-Friday 8 AM - 5 PM) [phone] After 5 PM and follow prompts

## 2014-11-09 NOTE — Patient Instructions (Signed)
Recommend taking pristiq at bedtime  Continue lorazepam as needed  Continue other medications as ordered  Will call with imaging appt  Follow up in 1 mo for re-eval

## 2014-11-10 MED ORDER — MIRTAZAPINE 15 MG PO TABS: ORAL_TABLET | ORAL | Status: DC

## 2014-11-10 NOTE — Telephone Encounter (Signed)
Patient notified and agreed. Faxed Rx to pharmacy.  

## 2014-11-11 ENCOUNTER — Other Ambulatory Visit: Payer: Medicare Other

## 2014-11-11 ENCOUNTER — Ambulatory Visit
Admission: RE | Admit: 2014-11-11 | Discharge: 2014-11-11 | Disposition: A | Discharge status: Home / Self Care | Payer: Medicare Other | Source: Ambulatory Visit | Attending: Internal Medicine | Admitting: Internal Medicine

## 2014-11-11 DIAGNOSIS — K5900 Constipation, unspecified: Secondary | ICD-10-CM | POA: Diagnosis not present

## 2014-11-11 DIAGNOSIS — K828 Other specified diseases of gallbladder: Secondary | ICD-10-CM | POA: Diagnosis not present

## 2014-11-11 DIAGNOSIS — R110 Nausea: Secondary | ICD-10-CM | POA: Diagnosis not present

## 2014-11-11 DIAGNOSIS — R1011 Right upper quadrant pain: Secondary | ICD-10-CM

## 2014-11-11 DIAGNOSIS — K219 Gastro-esophageal reflux disease without esophagitis: Secondary | ICD-10-CM

## 2014-11-14 ENCOUNTER — Other Ambulatory Visit: Payer: Self-pay | Admitting: Internal Medicine

## 2014-11-14 DIAGNOSIS — R1011 Right upper quadrant pain: Secondary | ICD-10-CM

## 2014-11-14 DIAGNOSIS — K828 Other specified diseases of gallbladder: Secondary | ICD-10-CM

## 2014-11-17 ENCOUNTER — Encounter: Payer: Self-pay | Admitting: Nurse Practitioner

## 2014-11-17 ENCOUNTER — Ambulatory Visit (INDEPENDENT_AMBULATORY_CARE_PROVIDER_SITE_OTHER): Payer: Medicare Other | Admitting: Nurse Practitioner

## 2014-11-17 VITALS — BP 180/82 | HR 56 | Temp 98.7°F | Resp 20 | Ht 68.0 in | Wt 151.8 lb

## 2014-11-17 DIAGNOSIS — I10 Essential (primary) hypertension: Secondary | ICD-10-CM

## 2014-11-17 DIAGNOSIS — R05 Cough: Secondary | ICD-10-CM

## 2014-11-17 DIAGNOSIS — R609 Edema, unspecified: Secondary | ICD-10-CM | POA: Diagnosis not present

## 2014-11-17 DIAGNOSIS — R059 Cough, unspecified: Secondary | ICD-10-CM

## 2014-11-17 NOTE — Patient Instructions (Signed)
For swelling in your legs,  Make sure you are not eating too much sodium Elevate legs when sitting May use compression hose  For cough Delsym twice daily May use tylenol as needed for sore throat  For blood pressure   Take blood pressure when you get home, if blood pressure remains elevated above 160/90 please call the office will need to adjust medications

## 2014-11-17 NOTE — Progress Notes (Signed)
Patient ID: Marissa Bowen, female   DOB: November 28, 1926, 81 y.o.   MRN: [id number]    PCP: Gildardo Cranker, DO  Allergies  Allergen Reactions  . Hydrocodone Other (See Comments)    REACTION: migraines  . Ibuprofen Other (See Comments)    ulcers  . Oxycodone Hcl Other (See Comments)    REACTION: migraines  . Penicillins Swelling and Rash  . Prednisone Other (See Comments)    ulcers  . Statins Other (See Comments)    Aches and pains    Chief Complaint  Patient presents with  . Acute Visit    sore throat and cough x 2days, ankles swelling since Sun/Mon     HPI: Patient is a 81 y.o. female seen in the office today due to cough. Pt of Dr Saralyn Pilar with a pmh of depression, htn, GERD, a fib. Cough from throat. Scratchy and irritated throat.  Worried about gallbladder, going to see surgeon and wanted [place] to look at her before she went.  Using cough drops which helps. When she coughs it sounds heavy and worries her.  No fevers or chills, no shortness of breath   Pt reports she is tired, been at the hospital with her granddaughter, tearful during visit. Does not take blood pressure at home, normally not as elevated   Notes swelling to ankles in the evening. In the morning it is gone but worse as the day goes by.   Review of Systems:  Review of Systems  Constitutional: Positive for appetite change and fatigue. Negative for fever, chills, diaphoresis and activity change.  HENT: Negative for ear pain, sore throat and trouble swallowing.   Respiratory: Positive for cough. Negative for chest tightness and shortness of breath.   Cardiovascular: Positive for leg swelling (in the evening). Negative for chest pain and palpitations.  Gastrointestinal: Positive for abdominal pain (has appt with surgery next week for gallbladder). Negative for vomiting, diarrhea and blood in stool.  Musculoskeletal: Negative for arthralgias.  Neurological: Negative for dizziness, tremors, numbness and headaches.    Psychiatric/Behavioral: Positive for dysphoric mood. Negative for sleep disturbance. The patient is nervous/anxious.     Past Medical History  Diagnosis Date  . BACK PAIN, LUMBAR 09/27/2009  . CORONARY ARTERY DISEASE     a. s/p remote CABG 1997, 3V.;  b. LexiScan Myoview (8/15): No ischemia, EF 72%, normal study  . DIVERTICULITIS, HX OF 11/25/2006  . FATIGUE 07/21/2008  . GERD 11/25/2006  . HYPERLIPIDEMIA   . HYPERTENSION   . IBS 12/07/2008  . MIGRAINE WITH AURA 08/13/2007  . Muscle weakness (generalized) 09/27/2009  . OSTEOARTHRITIS 08/13/2007  . TRANSIENT ISCHEMIC ATTACK 04/13/2007  . Hiatal hernia   . Paroxysmal atrial fibrillation Dx 02/2012  . Anxiety   . CVA (cerebral infarction)     a. Thalamic infarct 09/2009.  Marland Kitchen Anemia   . GI bleed     a. 09/2009: secondary to antral ulcer.  . Mild carotid artery disease     a. 0-39% bilat 05/2011 (f/u recommended 05/2013).  . Gastric ulcer   . Heart attack   . Acute fibrinous pericarditis    Past Surgical History  Procedure Laterality Date  . Tonsillectomy    . Abdominal hysterectomy  1973  . Coronary artery bypass graft  1997    Dr Melvenia Needles  . Coronary angioplasty  1997    Dr Lia Foyer   Social History:   reports that she has never smoked. She has never used smokeless tobacco. She reports that she  does not drink alcohol or use illicit drugs.  Family History  Problem Relation Age of Onset  . Heart attack Mother   . Heart failure Father   . Throat cancer Sister   . Heart disease Sister   . Coronary artery disease Sister   . Colon cancer Neg Hx   . Diabetes Sister     Medications: Patient's Medications  New Prescriptions   No medications on file  Previous Medications   ACETAMINOPHEN (TYLENOL) 500 MG TABLET    Take 500 mg by mouth every 6 (six) hours as needed for moderate pain (for pain).    AMIODARONE (PACERONE) 200 MG TABLET    TAKE ONE-HALF TABLET DAILY   ELIQUIS 5 MG TABS TABLET    TAKE ONE TABLET TWICE DAILY   FLAXSEED,  LINSEED, (FLAXSEED OIL) 1000 MG CAPS    Take 1,000 mg by mouth at bedtime.    FLUTICASONE (FLONASE) 50 MCG/ACT NASAL SPRAY    Place 2 sprays into both nostrils daily.   LORAZEPAM (ATIVAN) 1 MG TABLET    Take 1 tablet (1 mg total) by mouth every 8 (eight) hours as needed for anxiety.   LOSARTAN (COZAAR) 50 MG TABLET    Take 50 mg by mouth 2 (two) times daily.   METOPROLOL SUCCINATE (TOPROL-XL) 25 MG 24 HR TABLET    TAKE ONE-HALF TABLET DAILY   MIRTAZAPINE (REMERON) 15 MG TABLET    Take one tablet by mouth at bedtime to help mood and sleep   MULTIPLE VITAMIN (MULTIVITAMIN) TABLET    Take 1 tablet by mouth daily.     NIACIN 500 MG CR CAPSULE    Take 500 mg by mouth 2 (two) times daily.    NITROFURANTOIN, MACROCRYSTAL-MONOHYDRATE, (MACROBID) 100 MG CAPSULE    Take 100 mg by mouth 2 (two) times daily. FOR 7 DAYS ENDING 11/11/14   OMEGA-3 FATTY ACIDS (FISH OIL) 1000 MG CAPS    Take 1,000 mg by mouth 2 (two) times daily.    PANTOPRAZOLE (PROTONIX) 40 MG TABLET    TAKE ONE TABLET TWICE DAILY   SUCRALFATE (CARAFATE) 1 G TABLET    Take 1 tablet (1 g total) by mouth 4 (four) times daily.   ZOLPIDEM (AMBIEN) 10 MG TABLET    ONE TABLET AT BEDTIME FOR REST  Modified Medications   No medications on file  Discontinued Medications   No medications on file     Physical Exam:  Filed Vitals:   11/17/14 1131  Pulse: 56  Temp: 98.7 F (37.1 C)  TempSrc: Oral  Resp: 20  Height: 5\' 8"  (1.727 m)  Weight: 151 lb 12.8 oz (68.856 kg)  SpO2: 96%    Physical Exam  Constitutional: She is oriented to person, place, and time. No distress.  HENT:  Head: Normocephalic and atraumatic.  Right Ear: External ear normal.  Left Ear: External ear normal.  Nose: Nose normal.  Mouth/Throat: Oropharynx is clear and moist. No oropharyngeal exudate.  Eyes: Conjunctivae are normal. Pupils are equal, round, and reactive to light.  Neck: Normal range of motion. Neck supple.  Cardiovascular: Normal rate, regular rhythm and  normal heart sounds.   Pulmonary/Chest: Effort normal and breath sounds normal.  Abdominal: Soft. Bowel sounds are normal.  Musculoskeletal: She exhibits no edema or tenderness.  Neurological: She is alert and oriented to person, place, and time.  Skin: Skin is warm and dry. She is not diaphoretic.  Psychiatric:  Tearful during exam    Labs reviewed: Basic Metabolic  Panel:  Recent Labs  12/20/13 1514 04/06/14 1025 08/05/14 0914 11/05/14 1655  NA  --  137 140 137  K  --  3.7 4.1 3.4*  CL  --  99 98 101  CO2  --  30 27 25   GLUCOSE  --  104* 82 151*  BUN  --  14 14 17   CREATININE  --  0.8 0.69 0.77  CALCIUM  --  8.8 9.4 9.1  TSH 1.11 1.82  --   --    Liver Function Tests:  Recent Labs  12/20/13 1514 04/06/14 1025 08/05/14 0914  AST 20 23 20   ALT 14 28 22   ALKPHOS 57 72 69  BILITOT 0.5 0.8 0.5  PROT 6.8 6.4 6.6  ALBUMIN 3.8 3.5  --     Recent Labs  11/05/14 1655  LIPASE 29   No results for input(s): AMMONIA in the last 8760 hours. CBC:  Recent Labs  04/06/14 1025 10/07/14 1633 11/05/14 1655  WBC 7.3 6.3 15.9*  NEUTROABS 5.2 3.4  --   HGB 12.3  --  13.2  HCT 36.8 38.0 41.3  MCV 93.0  --  94.1  PLT 240.0  --  179   Lipid Panel:  Recent Labs  08/05/14 0914  CHOL 210*  HDL 72  LDLCALC 119*  TRIG 94  CHOLHDL 2.9   TSH:  Recent Labs  12/20/13 1514 04/06/14 1025  TSH 1.11 1.82   A1C: Lab Results  Component Value Date   HGBA1C * 09/29/2009    5.8 (NOTE)                                                                       According to the ADA Clinical Practice Recommendations for 2011, when HbA1c is used as a screening test:   >=6.5%   Diagnostic of Diabetes Mellitus           (if abnormal result  is confirmed)  5.7-6.4%   Increased risk of developing Diabetes Mellitus  References:Diagnosis and Classification of Diabetes Mellitus,Diabetes BWGY,6599,35(TSVXB 1):S62-S69 and Standards of Medical Care in         Diabetes - 2011,Diabetes  Care,2011,34  (Suppl 1):S11-S61.     Assessment/Plan 1. Cough Most likely viral, symptom management at this time Delsym twice daily May use tylenol as needed for sore throat -follow up precautions discussed, to return for fever worsening cough or congestion, shortness of breath, etc, pt understands and agrees   2. Essential hypertension -elevated today, pt tearful and anxious during visit. Has been sitting with her granddaughter in the ED.  -pt instructed to take blood pressure at home. To call if remains elevated, to cont current regimen at this time  3. Edema No edema today. Discussed low sodium diet  Elevate legs when sitting May use compression hose    Jessica K. Harle Battiest  [facility] [phone] 8 am - 5 pm) [phone] (after hours)

## 2014-11-21 ENCOUNTER — Telehealth: Payer: Self-pay | Admitting: *Deleted

## 2014-11-21 NOTE — Telephone Encounter (Signed)
Patient notified and agreed.  

## 2014-11-21 NOTE — Telephone Encounter (Signed)
Noted. Continue checking BP at home. F/u as scheduled

## 2014-11-21 NOTE — Telephone Encounter (Signed)
Patient called and stated that she was seen the other day by Janett Billow and had high blood pressure. Janett Billow told her to call back and let her know how the blood pressure was running and patient stated that her Blood pressure was gradually coming down and was 170/62. Patient feels fine.

## 2014-11-22 ENCOUNTER — Ambulatory Visit: Payer: Self-pay | Admitting: Surgery

## 2014-11-22 DIAGNOSIS — K8010 Calculus of gallbladder with chronic cholecystitis without obstruction: Secondary | ICD-10-CM | POA: Diagnosis not present

## 2014-11-22 NOTE — H&P (Signed)
History of Present Illness Marissa Bowen. [name] MD; 11/22/2014 2:09 PM) Patient words: GB.  The patient is a 81 year old female who presents for evaluation of gall stones. Referred by Dr. Gildardo Cranker for evaluation of gallbladder symptoms.  This is an 81 year old female who presents with a two-month history of intermittent upper abdominal pain. This pain is intermittent and still has to be exacerbated by eating greasy foods. The pain sometimes migrates to her back. Her symptoms are associated with abdominal bloating as well as nausea. She denies any vomiting or diarrhea. Her cardiologist is Dr. Mariann Laster. She was seen earlier this year in his office. After she presented with these symptoms, she underwent an ultrasound which showed some gallbladder sludge and one single non-shadowing gallstone. The common bile duct has some echogenic intraluminal material. Her last liver function test in April of this year were unremarkable. The patient is currently asymptomatic. She presents today to discuss elective surgery.   CLINICAL DATA: Generalized abdominal pain associated with nausea and constipation  EXAM: US ABDOMEN LIMITED - RIGHT UPPER QUADRANT  COMPARISON: Abdominal and pelvic CT scan of June 08, 2012  FINDINGS: Gallbladder:  The gallbladder is adequately distended. There is a small amount of sludge demonstrated. No typical mobile shadowing stones are demonstrated. There is a 9 mm non shadowing focus in the gallbladder neck which may reflect a noncalcified stone. There is no gallbladder wall thickening, pericholecystic fluid, or positive sonographic Murphy's sign.  Common bile duct:  Diameter: 2.5 mm. There are internal echoes within the common bile duct that may reflect sludge or tiny non shadowing stones.  Liver:  The hepatic echotexture is mildly increased. There is no focal mass or ductal dilation.  IMPRESSION: 1. Gallbladder sludge and likely tiny stones.  One stone appears to lie in the gallbladder neck and does not move. 2. Normal caliber common bile duct with echogenic intraluminal material suggesting tiny stones or sludge. 3. Fatty infiltrative change of the liver. 4. MRCP may be useful for further evaluation of the common bile duct and gallbladder.   Electronically Signed By: David Martinique M.D. On: 11/11/2014 16:25 Other Problems (Ammie Eversole, LPN; 9/76/7341 93:79 AM) Anxiety Disorder Atrial Fibrillation Bladder Problems Cerebrovascular Accident Cholelithiasis Depression Gastric Ulcer Gastroesophageal Reflux Disease Heart murmur High blood pressure Hypercholesterolemia Migraine Headache Myocardial infarction Oophorectomy Right. Transfusion history  Past Surgical History (Ammie Eversole, LPN; 0/24/0973 53:29 AM) Cataract Surgery Bilateral. Coronary Artery Bypass Graft Hysterectomy (not due to cancer) - Partial  Diagnostic Studies History (Ammie Eversole, LPN; 01/20/2682 41:96 AM) Colonoscopy 5-10 years ago Mammogram 1-3 years ago Pap Smear 1-5 years ago  Allergies (Ammie Eversole, LPN; 06/20/9796 92:11 AM) Hydrocodone-Acetaminophen *ANALGESICS - OPIOID* Statins Ibuprofen *ANALGESICS - ANTI-INFLAMMATORY* OxyCODONE HCl (Abuse Deter) *ANALGESICS - OPIOID* Penicillins PredniSONE (Pak) *CORTICOSTEROIDS*  Medication History (Ammie Eversole, LPN; 9/41/7408 14:48 AM) Amiodarone HCl (200MG  Tablet, Oral) Active. Eliquis (5MG  Tablet, Oral) Active. Fluticasone Propionate (50MCG/ACT Suspension, Nasal) Active. LORazepam (1MG  Tablet, Oral) Active. Metoprolol Succinate ER (25MG  Tablet ER 24HR, Oral) Active. Pantoprazole Sodium (40MG  Tablet DR, Oral) Active. Sucralfate (1GM Tablet, Oral) Active. Zolpidem Tartrate (10MG  Tablet, Oral) Active. Losartan Potassium (50MG  Tablet, Oral) Active. Tylenol Extra Strength (500MG  Tablet, Oral) Active. Flaxseed Oil (1000MG  Capsule, Oral)  Active. Multiple Vitamin (Oral) Active. Niacin (500MG  Tablet, Oral) Active. Fish Oil (1000MG  Capsule, Oral) Active. Carafate (1GM Tablet, Oral) Active. Medications Reconciled  Social History (Ammie Eversole, LPN; 1/85/6314 97:02 AM) Alcohol use Remotely quit alcohol use. No caffeine use No drug use Tobacco use Never  smoker.  Family History Aleatha Borer, LPN; 2/94/7654 65:03 AM) Breast Cancer Daughter. Cancer Sister. Heart Disease Father, Mother, Sister. Heart disease in female family member before age 51 Hypertension Sister.  Pregnancy / Birth History Aleatha Borer, LPN; 5/46/5681 27:51 AM) Age at menarche 89 years. Age of menopause 16-50 Gravida 5 Maternal age 39-25 Para 3     Review of Systems (Ammie Eversole LPN; 7/00/1749 44:96 AM) General Not Present- Appetite Loss, Chills, Fatigue, Fever, Night Sweats, Weight Gain and Weight Loss. Skin Not Present- Change in Wart/Mole, Dryness, Hives, Jaundice, New Lesions, Non-Healing Wounds, Rash and Ulcer. HEENT Present- Hoarseness. Not Present- Earache, Hearing Loss, Nose Bleed, Oral Ulcers, Ringing in the Ears, Seasonal Allergies, Sinus Pain, Sore Throat, Visual Disturbances, Wears glasses/contact lenses and Yellow Eyes. Respiratory Not Present- Bloody sputum, Chronic Cough, Difficulty Breathing, Snoring and Wheezing. Breast Not Present- Breast Mass, Breast Pain, Nipple Discharge and Skin Changes. Cardiovascular Present- Rapid Heart Rate and Swelling of Extremities. Not Present- Chest Pain, Difficulty Breathing Lying Down, Leg Cramps, Palpitations and Shortness of Breath. Gastrointestinal Present- Nausea. Not Present- Abdominal Pain, Bloating, Bloody Stool, Change in Bowel Habits, Chronic diarrhea, Constipation, Difficulty Swallowing, Excessive gas, Gets full quickly at meals, Hemorrhoids, Indigestion, Rectal Pain and Vomiting. Female Genitourinary Present- Frequency and Urgency. Not Present- Nocturia, Painful  Urination and Pelvic Pain. Musculoskeletal Present- Swelling of Extremities. Not Present- Back Pain, Joint Pain, Joint Stiffness, Muscle Pain and Muscle Weakness. Neurological Not Present- Decreased Memory, Fainting, Headaches, Numbness, Seizures, Tingling, Tremor, Trouble walking and Weakness. Psychiatric Present- Anxiety, Depression and Frequent crying. Not Present- Bipolar, Change in Sleep Pattern and Fearful. Endocrine Present- Cold Intolerance and Heat Intolerance. Not Present- Excessive Hunger, Hair Changes, Hot flashes and New Diabetes. Hematology Present- Easy Bruising. Not Present- Excessive bleeding, Gland problems, HIV and Persistent Infections.  Vitals (Ammie Eversole LPN; 7/59/1638 46:65 AM) 11/22/2014 11:24 AM Weight: 147.2 lb Height: 68in Body Surface Area: 1.79 m Body Mass Index: 22.38 kg/m Temp.: 98.17F(Oral)  Pulse: 80 (Regular)  BP: 132/62 (Sitting, Left Arm, Standard)     Physical Exam Rodman Key K. Javae Braaten MD; 11/22/2014 2:09 PM)  The physical exam findings are as follows: Note:WDWN in NAD HEENT: EOMI, sclera anicteric Neck: No masses, no thyromegaly Lungs: CTA bilaterally; normal respiratory effort CV: Regular rate and rhythm; no murmurs Abd: +bowel sounds, soft, mildly distended; tender in RUQ and epigastrium; no palpable masses Ext: Well-perfused; no edema Skin: Warm, dry; no sign of jaundice    Assessment & Plan Rodman Key K. Carmela Piechowski MD; 11/22/2014 11:52 AM)  CHRONIC CHOLECYSTITIS WITH CALCULUS (574.10  K80.10)  Current Plans Schedule for Surgery - Laparoscopic cholecystectomy with intraoperative cholangiogram. The surgical procedure has been discussed with the patient. Potential risks, benefits, alternative treatments, and expected outcomes have been explained. All of the patient's questions at this time have been answered. The likelihood of reaching the patient's treatment goal is good. The patient understand the proposed surgical procedure and  wishes to proceed. Note:Cardiac clearance - Dr. Benjamine Mola Hold Eliquis 5 days   Marissa Bowen. Georgette Dover, MD, [facility] Surgery  General/ Trauma Surgery  11/22/2014 2:10 PM

## 2014-11-25 ENCOUNTER — Telehealth: Payer: Self-pay | Admitting: Cardiology

## 2014-11-25 ENCOUNTER — Other Ambulatory Visit: Payer: Self-pay | Admitting: Cardiology

## 2014-11-25 NOTE — Telephone Encounter (Signed)
Pt asking about status of surgical clearance request from Dr Molli Posey- Pt advised surgical clearance request has been received, pt advised to keep  appt 12/02/14 with Richardson Dopp, PA,c, surgical clearance can be addressed at that time.

## 2014-11-25 NOTE — Telephone Encounter (Signed)
New Message  Pt req a call back. No furhter detail. Please call

## 2014-12-01 ENCOUNTER — Other Ambulatory Visit: Payer: Self-pay | Admitting: Physician Assistant

## 2014-12-01 NOTE — Progress Notes (Signed)
Cardiology Office Note   Date:  12/02/2014   ID:  Marissa Bowen, DOB 30-Oct-1926, MRN [id number]  PCP:  Gildardo Cranker, DO  Cardiologist:  Dr. Loralie Champagne   Electrophysiologist:  n/a  Chief Complaint  Patient presents with  . Surgical Clearance     History of Present Illness: Marissa Bowen is a 81 y.o. female with a hx of CAD s/p CABG in 1997 and paroxysmal atrial fibrillation. She had recurrent atrial fibrillation in 2015 and went to the emergency room. She converted back to NSR in the ER. Atrial fibrillation was actually first noted back in 11/13. At that time, she was placed on on apixaban and dronedarone. Since that time, she seems to have held NSR. She fell into the doughnut hole and had to stop dronedarone due to cost. Therefore, she was switched to amiodarone. She was on amlodipine for HTN but developed ankle swelling so was put on lisinopril but developed cough and is now on losartan. She had Lexiscan Cardiolite in 8/15 that showed no ischemia or infarction. Last seen by Dr. Aundra Dubin 03/2014.  The patient returns for surgical clearance. She needs a laparoscopic cholecystectomy. Her surgeon is Dr. Georgette Dover.  She is here today with her daughter.  She denies chest pain. She denies significant shortness of breath. She denies syncope. She denies orthopnea, PND or edema. She remains quite active. She works in her yard, cleans her house and goes to [employer]. She can achieve >4 METs.   Studies/Reports Reviewed Today:  Myoview 11/30/13 Normal stress nuclear study  LV Ejection Fraction: 72%. LV Wall Motion: NL LV Function; NL Wall Motion  Carotid US 06/30/13 bilat 1-39% ICA FU 2 years  Echo 04/03/12 Mild LVH, EF 60-65%, normal wall motion, grade 2 diastolic dysfunction, trivial AI, trivial MR, mild LAE, normal RV function, mild to moderate TR, PASP 37 mmHg (borderline pulmonary hypertension)  Event Monitor 11/2008 Normal    Past Medical History  Diagnosis Date  .  BACK PAIN, LUMBAR 09/27/2009  . CORONARY ARTERY DISEASE     a. s/p remote CABG 1997, 3V.;  b. LexiScan Myoview (8/15): No ischemia, EF 72%, normal study  . DIVERTICULITIS, HX OF 11/25/2006  . FATIGUE 07/21/2008  . GERD 11/25/2006  . HYPERLIPIDEMIA   . HYPERTENSION   . IBS 12/07/2008  . MIGRAINE WITH AURA 08/13/2007  . Muscle weakness (generalized) 09/27/2009  . OSTEOARTHRITIS 08/13/2007  . TRANSIENT ISCHEMIC ATTACK 04/13/2007  . Hiatal hernia   . Paroxysmal atrial fibrillation Dx 02/2012  . Anxiety   . CVA (cerebral infarction)     a. Thalamic infarct 09/2009.  Marland Kitchen Anemia   . GI bleed     a. 09/2009: secondary to antral ulcer.  . Mild carotid artery disease     a. 0-39% bilat 05/2011 (f/u recommended 05/2013).  . Gastric ulcer   . Heart attack   . Acute fibrinous pericarditis   1. CAD: s/p CABG 1997. Echo (12/13) with EF 60-65%, no significant valvular abnormalities, normal RV size and systolic function. Lexiscan Cardiolite (8/15) with EF 72%, no ischemia or infarction.  2. Paroxysmal atrial fibrillation noted since 11/13. Very symptomatic. She, of note, has a history of TIA in 2008. Was on dronedarone but stopped due to doughnut hole and now on amiodarone.  3. HTN: edema with amlodipine.  4. Hyperlipidemia: Unable to tolerate statins due to myalgias. 5. Migraines 6. GERD 7. IBS 8. Low back pain    Past Surgical History  Procedure Laterality Date  .  Tonsillectomy    . Abdominal hysterectomy  1973  . Coronary artery bypass graft  1997    Dr Melvenia Needles  . Coronary angioplasty  1997    Dr Lia Foyer     Current Outpatient Prescriptions  Medication Sig Dispense Refill  . acetaminophen (TYLENOL) 500 MG tablet Take 500 mg by mouth every 6 (six) hours as needed for moderate pain (for pain).     Marland Kitchen amiodarone (PACERONE) 200 MG tablet TAKE ONE-HALF TABLET DAILY 15 tablet 5  . ELIQUIS 5 MG TABS tablet TAKE ONE TABLET TWICE DAILY 60 tablet 0  . Flaxseed, Linseed, (FLAXSEED OIL) 1000 MG  CAPS Take 1,000 mg by mouth at bedtime.     . fluticasone (FLONASE) 50 MCG/ACT nasal spray Place 2 sprays into both nostrils daily.    Marland Kitchen LORazepam (ATIVAN) 1 MG tablet Take 1 tablet (1 mg total) by mouth every 8 (eight) hours as needed for anxiety. (Patient taking differently: Take 0.5-1 mg by mouth every 8 (eight) hours as needed for anxiety or sleep (TAKES 0.5MG  AS NEEDED IN DAYTIME, TAKES 1MG  AT BEDTIME). ) 90 tablet 3  . losartan (COZAAR) 50 MG tablet TAKE ONE TABLET TWICE DAILY 60 tablet 4  . metoprolol succinate (TOPROL-XL) 25 MG 24 hr tablet TAKE ONE-HALF TABLET DAILY 15 tablet 5  . Multiple Vitamin (MULTIVITAMIN) tablet Take 1 tablet by mouth daily.      . niacin 500 MG CR capsule Take 500 mg by mouth 2 (two) times daily.     . Omega-3 Fatty Acids (FISH OIL) 1000 MG CAPS Take 1,000 mg by mouth 2 (two) times daily.     . pantoprazole (PROTONIX) 40 MG tablet TAKE ONE TABLET TWICE DAILY 180 tablet 5  . zolpidem (AMBIEN) 10 MG tablet ONE TABLET AT BEDTIME FOR REST 30 tablet 3  . [DISCONTINUED] sertraline (ZOLOFT) 25 MG tablet Take 1 tablet (25 mg total) by mouth daily. 30 tablet 2   No current facility-administered medications for this visit.    Allergies:   Hydrocodone; Ibuprofen; Oxycodone hcl; Penicillins; Prednisone; and Statins    Social History:  The patient  reports that she has never smoked. She has never used smokeless tobacco. She reports that she does not drink alcohol or use illicit drugs.   Family History:  The patient's family history includes Coronary artery disease in her sister; Diabetes in her sister; Heart attack in her mother; Heart disease in her sister; Heart failure in her father; Throat cancer in her sister. There is no history of Colon cancer.    ROS:   Please see the history of present illness.   Review of Systems  Cardiovascular: Positive for irregular heartbeat.  Gastrointestinal: Positive for abdominal pain, constipation, nausea and vomiting.    Neurological: Positive for loss of balance.  Psychiatric/Behavioral: Positive for depression.  All other systems reviewed and are negative.     PHYSICAL EXAM: VS:  BP 122/52 mmHg  Pulse 59  Ht 5\' 8"  (1.727 m)  Wt 147 lb 12.8 oz (67.042 kg)  BMI 22.48 kg/m2  SpO2 96%    Wt Readings from Last 3 Encounters:  12/02/14 147 lb 12.8 oz (67.042 kg)  11/17/14 151 lb 12.8 oz (68.856 kg)  11/09/14 148 lb 6.4 oz (67.314 kg)     GEN: Well nourished, well developed, in no acute distress HEENT: normal Neck: no JVD,  no masses Cardiac:  Normal S1/S2, RRR; no murmur ,  no rubs or gallops, no edema   Respiratory:  clear  to auscultation bilaterally, no wheezing, rhonchi or rales. GI: soft, nontender, nondistended, + BS MS: no deformity or atrophy Skin: warm and dry  Neuro:  CNs II-XII intact, Strength and sensation are intact Psych: Normal affect   EKG:  EKG is ordered today.  It demonstrates:   Sinus bradycardia, HR 59, normal axis, nonspecific ST-T wave changes, QTc 455 ms   Recent Labs: 04/06/2014: TSH 1.82 08/05/2014: ALT 22 11/05/2014: BUN 17; Creatinine, Ser 0.77; Hemoglobin 13.2; Platelets 179; Potassium 3.4*; Sodium 137    Lipid Panel    Component Value Date/Time   CHOL 210* 08/05/2014 0914   CHOL 174 12/16/2012 1208   TRIG 94 08/05/2014 0914   HDL 72 08/05/2014 0914   HDL 47.90 12/16/2012 1208   CHOLHDL 2.9 08/05/2014 0914   CHOLHDL 4 12/16/2012 1208   VLDL 28.4 12/16/2012 1208   LDLCALC 119* 08/05/2014 0914   LDLCALC 98 12/16/2012 1208      ASSESSMENT AND PLAN:  Pre-operative cardiovascular examination:  She does not have any unstable cardiac conditions. She can achieve >4 METs without symptoms to suggest angina. She had a Myoview one year ago that was low risk. She does not require any further cardiac workup prior to her noncardiac surgery. She should be at acceptable risk.  Coronary artery disease involving native coronary artery of native heart without angina  pectoris:  No angina. She is not on aspirin as she is on Eliquis. She is intolerant to statins. Continue beta blocker, ARB.  Paroxysmal atrial fibrillation:  Maintaining NSR. She is >60 kg and her creatinine has been <1.5. She is on the correct dose of Eliquis. Recent CBC and BMET were acceptable. Continue amiodarone. Check TSH, LFTs today. She may hold her Eliquis for a total of 48 hours before her planned procedure. She does not need to hold Eliquis for more than 48 hours. She should resume Eliquis postoperatively when felt to be safe.  Essential hypertension:  Controlled.  Hyperlipidemia LDL goal <100:  Intolerant to statins.    Medication Changes: Current medicines are reviewed at length with the patient today.  Concerns regarding medicines are as outlined above.  The following changes have been made:   Discontinued Medications   LOSARTAN (COZAAR) 50 MG TABLET    Take 50 mg by mouth 2 (two) times daily.   MIRTAZAPINE (REMERON) 15 MG TABLET    Take one tablet by mouth at bedtime to help mood and sleep   NITROFURANTOIN, MACROCRYSTAL-MONOHYDRATE, (MACROBID) 100 MG CAPSULE    Take 100 mg by mouth 2 (two) times daily. FOR 7 DAYS ENDING 11/11/14   SUCRALFATE (CARAFATE) 1 G TABLET    Take 1 tablet (1 g total) by mouth 4 (four) times daily.   Modified Medications   No medications on file   New Prescriptions   No medications on file    Labs/ tests ordered today include:   No orders of the defined types were placed in this encounter.     Disposition:   FU with Dr. Aundra Dubin 6 months.   Signed, Versie Starks, MHS 12/02/2014 10:43 AM    [facility] [address], [place], [place]  [address] Phone: [phone]; Fax: [phone]

## 2014-12-02 ENCOUNTER — Encounter: Payer: Self-pay | Admitting: Physician Assistant

## 2014-12-02 ENCOUNTER — Ambulatory Visit (INDEPENDENT_AMBULATORY_CARE_PROVIDER_SITE_OTHER): Payer: Medicare Other | Admitting: Physician Assistant

## 2014-12-02 VITALS — BP 122/52 | HR 59 | Ht 68.0 in | Wt 147.8 lb

## 2014-12-02 DIAGNOSIS — I480 Paroxysmal atrial fibrillation: Secondary | ICD-10-CM

## 2014-12-02 DIAGNOSIS — Z79899 Other long term (current) drug therapy: Secondary | ICD-10-CM | POA: Diagnosis not present

## 2014-12-02 DIAGNOSIS — Z01810 Encounter for preprocedural cardiovascular examination: Secondary | ICD-10-CM

## 2014-12-02 DIAGNOSIS — I2510 Atherosclerotic heart disease of native coronary artery without angina pectoris: Secondary | ICD-10-CM | POA: Diagnosis not present

## 2014-12-02 DIAGNOSIS — I10 Essential (primary) hypertension: Secondary | ICD-10-CM | POA: Diagnosis not present

## 2014-12-02 DIAGNOSIS — E785 Hyperlipidemia, unspecified: Secondary | ICD-10-CM

## 2014-12-02 LAB — HEPATIC FUNCTION PANEL
ALT: 23 U/L (ref 0–35)
AST: 24 U/L (ref 0–37)
Albumin: 3.8 g/dL (ref 3.5–5.2)
Alkaline Phosphatase: 83 U/L (ref 39–117)
BILIRUBIN DIRECT: 0.1 mg/dL (ref 0.0–0.3)
Total Bilirubin: 0.6 mg/dL (ref 0.2–1.2)
Total Protein: 6.8 g/dL (ref 6.0–8.3)

## 2014-12-02 LAB — TSH: TSH: 1.85 u[IU]/mL (ref 0.35–4.50)

## 2014-12-02 NOTE — Patient Instructions (Signed)
**Note De-Identified  Obfuscation** Medication Instructions:  Same-no change  Labwork: TSH and LFTs today  Testing/Procedures: None  Follow-Up: Your physician wants you to follow-up in: 6 months with Dr Aundra Dubin. You will receive a reminder letter in the mail two months in advance. If you don't receive a letter, please call our office to schedule the follow-up appointment.

## 2014-12-06 ENCOUNTER — Telehealth: Payer: Self-pay | Admitting: *Deleted

## 2014-12-06 NOTE — Telephone Encounter (Signed)
Pt notified of lab results by phone with verbal understanding.  

## 2014-12-08 DIAGNOSIS — N8189 Other female genital prolapse: Secondary | ICD-10-CM | POA: Diagnosis not present

## 2014-12-14 ENCOUNTER — Encounter: Payer: Self-pay | Admitting: Internal Medicine

## 2014-12-14 ENCOUNTER — Ambulatory Visit (INDEPENDENT_AMBULATORY_CARE_PROVIDER_SITE_OTHER): Payer: Medicare Other | Admitting: Internal Medicine

## 2014-12-14 VITALS — BP 140/88 | HR 61 | Temp 98.3°F | Resp 20 | Ht 68.0 in | Wt 148.8 lb

## 2014-12-14 DIAGNOSIS — G4700 Insomnia, unspecified: Secondary | ICD-10-CM

## 2014-12-14 DIAGNOSIS — F418 Other specified anxiety disorders: Secondary | ICD-10-CM | POA: Diagnosis not present

## 2014-12-14 DIAGNOSIS — I2510 Atherosclerotic heart disease of native coronary artery without angina pectoris: Secondary | ICD-10-CM

## 2014-12-14 DIAGNOSIS — F4321 Adjustment disorder with depressed mood: Secondary | ICD-10-CM

## 2014-12-14 MED ORDER — FLUTICASONE PROPIONATE 50 MCG/ACT NA SUSP: 2.0000 | Freq: Every day | NASAL | Status: DC

## 2014-12-14 NOTE — Progress Notes (Signed)
Patient ID: Marissa Bowen, female   DOB: 1926-10-25, 81 y.o.   MRN: [id number]    Location:    [facility]   Place of Service:  OFFICE   Chief Complaint  Patient presents with  . Medical Management of Chronic Issues    1 month follow-up    HPI:  81 yo female seen today for f/u depression and uncontrolled grief. She tried to take pristiq but experienced next day drowsiness and hangover sensation. She stopped med after trying it several times. Now taking lorazepam and ambien at bedtime. Sleeping better although not all night some times. Anxiety improving.  Scheduled for gallbladder sx on Sept 6th.   Past Medical History  Diagnosis Date  . BACK PAIN, LUMBAR 09/27/2009  . CORONARY ARTERY DISEASE     a. s/p remote CABG 1997, 3V.;  b. LexiScan Myoview (8/15): No ischemia, EF 72%, normal study  . DIVERTICULITIS, HX OF 11/25/2006  . FATIGUE 07/21/2008  . GERD 11/25/2006  . HYPERLIPIDEMIA   . HYPERTENSION   . IBS 12/07/2008  . MIGRAINE WITH AURA 08/13/2007  . Muscle weakness (generalized) 09/27/2009  . OSTEOARTHRITIS 08/13/2007  . TRANSIENT ISCHEMIC ATTACK 04/13/2007  . Hiatal hernia   . Paroxysmal atrial fibrillation Dx 02/2012  . Anxiety   . CVA (cerebral infarction)     a. Thalamic infarct 09/2009.  Marland Kitchen Anemia   . GI bleed     a. 09/2009: secondary to antral ulcer.  . Mild carotid artery disease     a. 0-39% bilat 05/2011 (f/u recommended 05/2013).  . Gastric ulcer   . Heart attack   . Acute fibrinous pericarditis     Past Surgical History  Procedure Laterality Date  . Tonsillectomy    . Abdominal hysterectomy  1973  . Coronary artery bypass graft  1997    Dr Melvenia Needles  . Coronary angioplasty  1997    Dr Lia Foyer    Patient Care Team: Gildardo Cranker, DO as PCP - General (Internal Medicine) Hillary Bow, MD (Cardiology) Melissa Montane, MD as Consulting Physician (Otolaryngology) Arvella Nigh, MD as Consulting Physician (Obstetrics and Gynecology) Rutherford Guys, MD as Consulting Physician  (Ophthalmology)  Social History   Social History  . Marital Status: Married    Spouse Name: N/A  . Number of Children: N/A  . Years of Education: N/A   Occupational History  . Not on file.   Social History Main Topics  . Smoking status: Never Smoker   . Smokeless tobacco: Never Used  . Alcohol Use: No  . Drug Use: No  . Sexual Activity: No   Other Topics Concern  . Not on file   Social History Narrative     reports that she has never smoked. She has never used smokeless tobacco. She reports that she does not drink alcohol or use illicit drugs.  Allergies  Allergen Reactions  . Hydrocodone Other (See Comments)    REACTION: migraines  . Ibuprofen Other (See Comments)    ulcers  . Oxycodone Hcl Other (See Comments)    REACTION: migraines  . Penicillins Swelling and Rash  . Prednisone Other (See Comments)    ulcers  . Statins Other (See Comments)    Aches and pains  . Pristiq [Desvenlafaxine] Other (See Comments)    Drowsiness and hangover sensation    Medications: Patient's Medications  New Prescriptions   No medications on file  Previous Medications   ACETAMINOPHEN (TYLENOL) 500 MG TABLET    Take 500 mg by mouth  every 6 (six) hours as needed for moderate pain (for pain).    AMIODARONE (PACERONE) 200 MG TABLET    TAKE ONE-HALF TABLET DAILY   ELIQUIS 5 MG TABS TABLET    TAKE ONE TABLET TWICE DAILY   FLAXSEED, LINSEED, (FLAXSEED OIL) 1000 MG CAPS    Take 1,000 mg by mouth at bedtime.    FLUTICASONE (FLONASE) 50 MCG/ACT NASAL SPRAY    Place 2 sprays into both nostrils daily.   LORAZEPAM (ATIVAN) 1 MG TABLET    Take 1 tablet (1 mg total) by mouth every 8 (eight) hours as needed for anxiety.   LOSARTAN (COZAAR) 50 MG TABLET    TAKE ONE TABLET TWICE DAILY   METOPROLOL SUCCINATE (TOPROL-XL) 25 MG 24 HR TABLET    TAKE ONE-HALF TABLET DAILY   MULTIPLE VITAMIN (MULTIVITAMIN) TABLET    Take 1 tablet by mouth daily.     NIACIN 500 MG CR CAPSULE    Take 500 mg by mouth 2  (two) times daily.    OMEGA-3 FATTY ACIDS (FISH OIL) 1000 MG CAPS    Take 1,000 mg by mouth 2 (two) times daily.    PANTOPRAZOLE (PROTONIX) 40 MG TABLET    TAKE ONE TABLET TWICE DAILY   ZOLPIDEM (AMBIEN) 10 MG TABLET    ONE TABLET AT BEDTIME FOR REST  Modified Medications   No medications on file  Discontinued Medications   No medications on file    Review of Systems  Constitutional: Negative for appetite change.  Respiratory: Negative for chest tightness and shortness of breath.   Cardiovascular: Negative for chest pain.  Gastrointestinal: Positive for nausea.  Psychiatric/Behavioral: Positive for sleep disturbance and dysphoric mood. Negative for suicidal ideas, hallucinations and self-injury. The patient is nervous/anxious.     Filed Vitals:   12/14/14 1522  BP: 140/88  Pulse: 61  Temp: 98.3 F (36.8 C)  TempSrc: Oral  Resp: 20  Height: 5' 8"  (6.073 m)  Weight: 148 lb 12.8 oz (67.495 kg)  SpO2: 96%   Body mass index is 22.63 kg/(m^2).  Physical Exam  Constitutional: She appears well-developed and well-nourished. No distress.  Cardiovascular: Normal rate, regular rhythm and intact distal pulses.   Murmur (1/6 SEM) heard. Pulmonary/Chest: Effort normal and breath sounds normal. No respiratory distress. She has no wheezes. She has no rales. She exhibits no tenderness.  Neurological: She is alert.  Skin: Skin is warm and dry. No rash noted.  Psychiatric: Her speech is normal and behavior is normal. Her mood appears anxious.     Labs reviewed: Office Visit on 12/02/2014  Component Date Value Ref Range Status  . TSH 12/02/2014 1.85  0.35 - 4.50 uIU/mL Final  . Total Bilirubin 12/02/2014 0.6  0.2 - 1.2 mg/dL Final  . Bilirubin, Direct 12/02/2014 0.1  0.0 - 0.3 mg/dL Final  . Alkaline Phosphatase 12/02/2014 83  39 - 117 U/L Final  . AST 12/02/2014 24  0 - 37 U/L Final  . ALT 12/02/2014 23  0 - 35 U/L Final  . Total Protein 12/02/2014 6.8  6.0 - 8.3 g/dL Final  .  Albumin 12/02/2014 3.8  3.5 - 5.2 g/dL Final  Office Visit on 11/09/2014  Component Date Value Ref Range Status  . Color, UA 11/09/2014 brown   Final  . Clarity, UA 11/09/2014 cloudy   Final  . Glucose, UA 11/09/2014 negative   Final  . Bilirubin, UA 11/09/2014 2+   Final  . Ketones, UA 11/09/2014 trace   Final  .  Spec Grav, UA 11/09/2014 1.015   Final  . Blood, UA 11/09/2014 trace   Final  . pH, UA 11/09/2014 5.0   Final  . Protein, UA 11/09/2014 30   Final  . Urobilinogen, UA 11/09/2014 negative   Final  . Nitrite, UA 11/09/2014 negative   Final  . Leukocytes, UA 11/09/2014 moderate (2+)* Negative Final  Admission on 11/05/2014, Discharged on 11/05/2014  Component Date Value Ref Range Status  . WBC 11/05/2014 15.9* 4.0 - 10.5 K/uL Final  . RBC 11/05/2014 4.39  3.87 - 5.11 MIL/uL Final  . Hemoglobin 11/05/2014 13.2  12.0 - 15.0 g/dL Final  . HCT 11/05/2014 41.3  36.0 - 46.0 % Final  . MCV 11/05/2014 94.1  78.0 - 100.0 fL Final  . MCH 11/05/2014 30.1  26.0 - 34.0 pg Final  . MCHC 11/05/2014 32.0  30.0 - 36.0 g/dL Final  . RDW 11/05/2014 13.4  11.5 - 15.5 % Final  . Platelets 11/05/2014 179  150 - 400 K/uL Final  . Sodium 11/05/2014 137  135 - 145 mmol/L Final  . Potassium 11/05/2014 3.4* 3.5 - 5.1 mmol/L Final  . Chloride 11/05/2014 101  101 - 111 mmol/L Final  . CO2 11/05/2014 25  22 - 32 mmol/L Final  . Glucose, Bld 11/05/2014 151* 65 - 99 mg/dL Final  . BUN 11/05/2014 17  6 - 20 mg/dL Final  . Creatinine, Ser 11/05/2014 0.77  0.44 - 1.00 mg/dL Final  . Calcium 11/05/2014 9.1  8.9 - 10.3 mg/dL Final  . GFR calc non Af Amer 11/05/2014 >60  >60 mL/min Final  . GFR calc Af Amer 11/05/2014 >60  >60 mL/min Final   Comment: (NOTE) The eGFR has been calculated using the CKD EPI equation. This calculation has not been validated in all clinical situations. eGFR's persistently <60 mL/min signify possible Chronic Kidney Disease.   . Anion gap 11/05/2014 11  5 - 15 Final  . Troponin  i, poc 11/05/2014 0.01  0.00 - 0.08 ng/mL Final  . Comment 3 11/05/2014          Final   Comment: Due to the release kinetics of cTnI, a negative result within the first hours of the onset of symptoms does not rule out myocardial infarction with certainty. If myocardial infarction is still suspected, repeat the test at appropriate intervals.   . Lipase 11/05/2014 29  22 - 51 U/L Final  . Troponin I 11/05/2014 <0.03  <0.031 ng/mL Final   Comment:        NO INDICATION OF MYOCARDIAL INJURY.   Appointment on 10/10/2014  Component Date Value Ref Range Status  . Fecal Occult Bld 10/10/2014 Negative  Negative Final  Office Visit on 10/07/2014  Component Date Value Ref Range Status  . WBC 10/07/2014 6.3  3.4 - 10.8 x10E3/uL Final  . RBC 10/07/2014 4.22  3.77 - 5.28 x10E6/uL Final  . Hemoglobin 10/07/2014 12.8  11.1 - 15.9 g/dL Final  . Hematocrit 10/07/2014 38.0  34.0 - 46.6 % Final  . MCV 10/07/2014 90  79 - 97 fL Final  . MCH 10/07/2014 30.3  26.6 - 33.0 pg Final  . MCHC 10/07/2014 33.7  31.5 - 35.7 g/dL Final  . RDW 10/07/2014 13.4  12.3 - 15.4 % Final  . Platelets 10/07/2014 213  150 - 379 x10E3/uL Final  . Neutrophils 10/07/2014 54   Final  . Lymphs 10/07/2014 30   Final  . Monocytes 10/07/2014 13   Final  . Eos  10/07/2014 3   Final  . Basos 10/07/2014 0   Final  . Neutrophils Absolute 10/07/2014 3.4  1.4 - 7.0 x10E3/uL Final  . Lymphocytes Absolute 10/07/2014 1.9  0.7 - 3.1 x10E3/uL Final  . Monocytes Absolute 10/07/2014 0.8  0.1 - 0.9 x10E3/uL Final  . EOS (ABSOLUTE) 10/07/2014 0.2  0.0 - 0.4 x10E3/uL Final  . Basophils Absolute 10/07/2014 0.0  0.0 - 0.2 x10E3/uL Final  . Immature Granulocytes 10/07/2014 0   Final  . Immature Grans (Abs) 10/07/2014 0.0  0.0 - 0.1 x10E3/uL Final    No results found.   Assessment/Plan   ICD-9-CM ICD-10-CM   1. Depression with anxiety - improving 300.4 F41.8   2. Grief reaction - improving 309.0 F43.21   3. Insomnia - improving 780.52  G47.00     --allergy list updated reflecting intolerance of pristiq  --no other changes in meds. She will call when she needs RF  Follow up in 2 mos for routine visit  Olanrewaju Osborn S. Perlie Gold  [facility] [address] [place], [place] [address] [phone] Cell (Monday-Friday 8 AM - 5 PM) [phone] After 5 PM and follow prompts

## 2014-12-14 NOTE — Patient Instructions (Signed)
Continue current medications as ordered  Follow up in 2 mos for routine visit

## 2014-12-26 ENCOUNTER — Other Ambulatory Visit: Payer: Self-pay | Admitting: Cardiology

## 2014-12-26 ENCOUNTER — Encounter (HOSPITAL_COMMUNITY)
Admission: RE | Admit: 2014-12-26 | Discharge: 2014-12-26 | Disposition: A | Discharge status: Home / Self Care | Payer: Medicare Other | Source: Ambulatory Visit | Attending: Surgery | Admitting: Surgery

## 2014-12-26 ENCOUNTER — Encounter (HOSPITAL_COMMUNITY): Payer: Self-pay

## 2014-12-26 DIAGNOSIS — K8010 Calculus of gallbladder with chronic cholecystitis without obstruction: Secondary | ICD-10-CM | POA: Diagnosis not present

## 2014-12-26 DIAGNOSIS — Z79899 Other long term (current) drug therapy: Secondary | ICD-10-CM | POA: Insufficient documentation

## 2014-12-26 DIAGNOSIS — Z951 Presence of aortocoronary bypass graft: Secondary | ICD-10-CM | POA: Insufficient documentation

## 2014-12-26 DIAGNOSIS — Z01818 Encounter for other preprocedural examination: Secondary | ICD-10-CM | POA: Diagnosis not present

## 2014-12-26 DIAGNOSIS — I480 Paroxysmal atrial fibrillation: Secondary | ICD-10-CM | POA: Diagnosis not present

## 2014-12-26 DIAGNOSIS — Z01812 Encounter for preprocedural laboratory examination: Secondary | ICD-10-CM | POA: Diagnosis not present

## 2014-12-26 DIAGNOSIS — I2510 Atherosclerotic heart disease of native coronary artery without angina pectoris: Secondary | ICD-10-CM | POA: Insufficient documentation

## 2014-12-26 DIAGNOSIS — E785 Hyperlipidemia, unspecified: Secondary | ICD-10-CM | POA: Diagnosis not present

## 2014-12-26 DIAGNOSIS — I10 Essential (primary) hypertension: Secondary | ICD-10-CM | POA: Insufficient documentation

## 2014-12-26 DIAGNOSIS — Z7902 Long term (current) use of antithrombotics/antiplatelets: Secondary | ICD-10-CM | POA: Diagnosis not present

## 2014-12-26 DIAGNOSIS — Z8673 Personal history of transient ischemic attack (TIA), and cerebral infarction without residual deficits: Secondary | ICD-10-CM | POA: Diagnosis not present

## 2014-12-26 HISTORY — DX: Stress incontinence (female) (male): N39.3

## 2014-12-26 LAB — CBC
HCT: 38.7 % (ref 36.0–46.0)
HEMOGLOBIN: 12.8 g/dL (ref 12.0–15.0)
MCH: 31.4 pg (ref 26.0–34.0)
MCHC: 33.1 g/dL (ref 30.0–36.0)
MCV: 95.1 fL (ref 78.0–100.0)
Platelets: 194 10*3/uL (ref 150–400)
RBC: 4.07 MIL/uL (ref 3.87–5.11)
RDW: 13.7 % (ref 11.5–15.5)
WBC: 7.1 10*3/uL (ref 4.0–10.5)

## 2014-12-26 LAB — BASIC METABOLIC PANEL
ANION GAP: 7 (ref 5–15)
BUN: 8 mg/dL (ref 6–20)
CALCIUM: 9.3 mg/dL (ref 8.9–10.3)
CO2: 30 mmol/L (ref 22–32)
Chloride: 102 mmol/L (ref 101–111)
Creatinine, Ser: 0.65 mg/dL (ref 0.44–1.00)
GFR calc Af Amer: >60 mL/min (ref >60)
GLUCOSE: 96 mg/dL (ref 65–99)
Potassium: 3.7 mmol/L (ref 3.5–5.1)
SODIUM: 139 mmol/L (ref 135–145)

## 2014-12-26 NOTE — Progress Notes (Signed)
Anesthesia Chart Review:  Pt is 81 year old female scheduled for laparoscopic cholecystectomy with intraoperative cholangiogram on 01/03/2015 with Dr. Georgette Dover.   PCP is Dr. Gildardo Cranker. Cardiologist is Dr. Loralie Champagne.   PMH includes: CAD (s/p 3V CABG 1997), TIA, CVA (2011), PAF, anemia, hyperlipidemia, HTN. Never smoker. BMI 23.   Medications include: amiodarone, eliquis, losartan, metoprolol, protonix. Pt to hold elquis 48 hours prior to surgery.   Preoperative labs reviewed.  PT/PTT to be obtained DOS.   Chest x-ray 11/05/2014 reviewed.  1. Mild cardiomegaly, without edema. 2. Minimal scarring at the left lung base. 3. Prior CABG. 4. Atherosclerotic aortic arch.  EKG 12/02/2014: sinus bradycardia (59 bpm). Nonspecific ST-T wave changes.   Nuclear stress test 11/29/2013:  -Normal stress nuclear study. LV Ejection Fraction: 72%. LV Wall Motion: NL LV Function; NL Wall Motion  Carotid US 06/28/2013: -B 1-39% ICA stenosis  Echo 04/03/2012:  - Left ventricle: The cavity size was normal. Wall thickness was increased in a pattern of mild LVH. Systolic function was normal. The estimated ejection fraction was in the range of 60% to 65%. Wall motion was normal; there were no regional wall motion abnormalities. Features are consistent with a pseudonormal left ventricular filling pattern, with concomitant abnormal relaxation and increased filling pressure (grade 2 diastolic dysfunction). - Aortic valve: There was no stenosis. Trivial regurgitation. - Mitral valve: Trivial regurgitation. - Left atrium: The atrium was mildly dilated. - Right ventricle: The cavity size was normal. Systolic function was normal. - Tricuspid valve: Mild-moderate regurgitation. Peak RV-RA gradient: 34mm Hg (S). - Pulmonary arteries: PA peak pressure: 53mm Hg (S). - Inferior vena cava: The vessel was normal in size; the respirophasic diameter changes were in the normal range (= 50%); findings are consistent with normal  central venous pressure. -Impressions: Normal LV size with mild LV hypertrophy. EF 60-65%. Moderate diastolic dysfunction. Normal RV size and systolic function. Borderline pulmonary hypertension. Biatrial enlargement.  Pt has cardiac clearance from Richardson Dopp, [place] in epic note dated 12/01/2014.   If no changes and PT/PTT acceptable DOS, I anticipate pt can proceed with surgery as scheduled.   Willeen Cass, FNP-BC [facility]/Anesthesiology Phone: [phone] 12/26/2014 4:58 PM

## 2014-12-26 NOTE — Pre-Procedure Instructions (Signed)
Marissa Bowen  12/26/2014      [facility] - [place] - [address] [place] [address] Phone: [phone] Fax: [phone]    Your procedure is scheduled on Tuesday, September 6  Report to [facility] Admitting at 07:30 A.M.  Call this number if you have problems the morning of surgery:  [phone]   Remember:  Do not eat food or drink liquids after midnight.Monday night.                  Take these medicines the morning of surgery with A SIP OF WATER : Amiodarone,Metoprolol, Protonix,              Stop Eliquis for 2 days before surgery. Last dose 12/31/2014.              Stop Fish oil,herbal supplements, aspirin .    Do not wear jewelry, make-up or nail polish.  Do not wear lotions, powders, or perfumes.  Do not wear deodorant.  Do not shave 48 hours prior to surgery.     Do not bring valuables to the hospital.  [facility] is not responsible for any belongings or valuables.  Contacts, dentures or bridgework may not be worn into surgery.  Leave your suitcase in the car.  After surgery it may be brought to your room.  For patients admitted to the hospital, discharge time will be determined by your treatment team.  Patients discharged the day of surgery will not be allowed to drive home.     Special instructions:  [place] - Preparing for Surgery  Before surgery, you can play an important role.  Because skin is not sterile, your skin needs to be as free of germs as possible.  You can reduce the number of germs on you skin by washing with CHG (chlorahexidine gluconate) soap before surgery.  CHG is an antiseptic cleaner which kills germs and bonds with the skin to continue killing germs even after washing.  Please DO NOT use if you have an allergy to CHG or antibacterial soaps.  If your skin becomes reddened/irritated stop using the CHG and inform your nurse when you arrive at Short Stay.  Do not shave (including legs and  underarms) for at least 48 hours prior to the first CHG shower.  You may shave your face.  Please follow these instructions carefully:   1.  Shower with CHG Soap the night before surgery and the  morning of Surgery.  2.  If you choose to wash your hair, wash your hair first as usual with your   normal shampoo.  3.  After you shampoo, rinse your hair and body thoroughly to remove the    Shampoo.  4.  Use CHG as you would any other liquid soap.  You can apply chg directly  to the skin and wash gently with scrungie or a clean washcloth.  5.  Apply the CHG Soap to your body ONLY FROM THE NECK DOWN.    Do not use on open wounds or open sores.  Avoid contact with your eyes,  ears, mouth and genitals (private parts).  Wash genitals (private parts)  with your normal soap.  6.  Wash thoroughly, paying special attention to the area where your surgery   will be performed.  7.  Thoroughly rinse your body with warm water from the neck down.  8.  DO NOT shower/wash with your normal soap  after using and rinsing off   the CHG Soap.  9.  Pat yourself dry with a clean towel.            10.  Wear clean pajamas.            11.  Place clean sheets on your bed the night of your first shower and do not   sleep with pets.  Day of Surgery  Do not apply any lotions/deoderants the morning of surgery.  Please wear clean clothes to the hospital/surgery center.    Please read over the following fact sheets that you were given. Pain Booklet, Coughing and Deep Breathing and Surgical Site Infection Prevention

## 2014-12-28 DIAGNOSIS — N8189 Other female genital prolapse: Secondary | ICD-10-CM | POA: Diagnosis not present

## 2015-01-02 MED ORDER — VANCOMYCIN HCL IN DEXTROSE 1-5 GM/200ML-% IV SOLN
1000.0000 mg | INTRAVENOUS | Status: AC
  Administered 2015-01-03: 1000 mg via INTRAVENOUS
  Filled 2015-01-02 (×2): qty 200

## 2015-01-02 MED ORDER — CEFAZOLIN SODIUM-DEXTROSE 2-3 GM-% IV SOLR: 2.0000 g | INTRAVENOUS | Status: DC

## 2015-01-03 ENCOUNTER — Encounter (HOSPITAL_COMMUNITY): Payer: Self-pay | Admitting: *Deleted

## 2015-01-03 ENCOUNTER — Ambulatory Visit (HOSPITAL_COMMUNITY)
Admission: RE | Admit: 2015-01-03 | Discharge: 2015-01-05 | Disposition: A | Discharge status: Home / Self Care | Payer: Medicare Other | Source: Ambulatory Visit | Attending: Surgery | Admitting: Surgery

## 2015-01-03 ENCOUNTER — Ambulatory Visit (HOSPITAL_COMMUNITY): Payer: Medicare Other

## 2015-01-03 ENCOUNTER — Ambulatory Visit (HOSPITAL_COMMUNITY): Payer: Medicare Other | Admitting: Certified Registered Nurse Anesthetist

## 2015-01-03 ENCOUNTER — Encounter (HOSPITAL_COMMUNITY)
Admission: RE | Disposition: A | Discharge status: Home / Self Care | Payer: Self-pay | Source: Ambulatory Visit | Attending: Surgery

## 2015-01-03 ENCOUNTER — Ambulatory Visit (HOSPITAL_COMMUNITY): Payer: Medicare Other | Admitting: Emergency Medicine

## 2015-01-03 DIAGNOSIS — F419 Anxiety disorder, unspecified: Secondary | ICD-10-CM | POA: Insufficient documentation

## 2015-01-03 DIAGNOSIS — Z880 Allergy status to penicillin: Secondary | ICD-10-CM | POA: Diagnosis not present

## 2015-01-03 DIAGNOSIS — Z8673 Personal history of transient ischemic attack (TIA), and cerebral infarction without residual deficits: Secondary | ICD-10-CM | POA: Diagnosis not present

## 2015-01-03 DIAGNOSIS — Z7901 Long term (current) use of anticoagulants: Secondary | ICD-10-CM | POA: Diagnosis not present

## 2015-01-03 DIAGNOSIS — Z951 Presence of aortocoronary bypass graft: Secondary | ICD-10-CM | POA: Diagnosis not present

## 2015-01-03 DIAGNOSIS — K5900 Constipation, unspecified: Secondary | ICD-10-CM | POA: Insufficient documentation

## 2015-01-03 DIAGNOSIS — I2510 Atherosclerotic heart disease of native coronary artery without angina pectoris: Secondary | ICD-10-CM | POA: Insufficient documentation

## 2015-01-03 DIAGNOSIS — I10 Essential (primary) hypertension: Secondary | ICD-10-CM | POA: Insufficient documentation

## 2015-01-03 DIAGNOSIS — K8010 Calculus of gallbladder with chronic cholecystitis without obstruction: Secondary | ICD-10-CM | POA: Diagnosis not present

## 2015-01-03 DIAGNOSIS — D649 Anemia, unspecified: Secondary | ICD-10-CM | POA: Diagnosis not present

## 2015-01-03 DIAGNOSIS — M545 Low back pain: Secondary | ICD-10-CM | POA: Diagnosis not present

## 2015-01-03 DIAGNOSIS — I4891 Unspecified atrial fibrillation: Secondary | ICD-10-CM | POA: Insufficient documentation

## 2015-01-03 DIAGNOSIS — I252 Old myocardial infarction: Secondary | ICD-10-CM | POA: Diagnosis not present

## 2015-01-03 DIAGNOSIS — K8012 Calculus of gallbladder with acute and chronic cholecystitis without obstruction: Secondary | ICD-10-CM | POA: Diagnosis not present

## 2015-01-03 HISTORY — PX: CHOLECYSTECTOMY: SHX55

## 2015-01-03 HISTORY — PX: LAPAROSCOPIC CHOLECYSTECTOMY: SUR755

## 2015-01-03 LAB — CREATININE, SERUM
CREATININE: 0.66 mg/dL (ref 0.44–1.00)
GFR calc Af Amer: >60 mL/min (ref >60)
GFR calc non Af Amer: >60 mL/min (ref >60)

## 2015-01-03 LAB — CBC
HCT: 40.8 % (ref 36.0–46.0)
HEMOGLOBIN: 13.9 g/dL (ref 12.0–15.0)
MCH: 31.9 pg (ref 26.0–34.0)
MCHC: 34.1 g/dL (ref 30.0–36.0)
MCV: 93.6 fL (ref 78.0–100.0)
Platelets: 170 10*3/uL (ref 150–400)
RBC: 4.36 MIL/uL (ref 3.87–5.11)
RDW: 13.5 % (ref 11.5–15.5)
WBC: 14 10*3/uL — ABNORMAL HIGH (ref 4.0–10.5)

## 2015-01-03 LAB — APTT: aPTT: 30 seconds (ref 24–37)

## 2015-01-03 LAB — PROTIME-INR
INR: 1.09 (ref 0.00–1.49)
PROTHROMBIN TIME: 14.3 s (ref 11.6–15.2)

## 2015-01-03 SURGERY — LAPAROSCOPIC CHOLECYSTECTOMY WITH INTRAOPERATIVE CHOLANGIOGRAM
Anesthesia: General | Site: Abdomen

## 2015-01-03 MED ORDER — SODIUM CHLORIDE 0.9 % IV SOLN
INTRAVENOUS | Status: DC | PRN
  Administered 2015-01-03: 11 mL

## 2015-01-03 MED ORDER — NEOSTIGMINE METHYLSULFATE 10 MG/10ML IV SOLN
INTRAVENOUS | Status: DC | PRN
  Administered 2015-01-03: 3 mg via INTRAVENOUS

## 2015-01-03 MED ORDER — EPHEDRINE SULFATE 50 MG/ML IJ SOLN
INTRAMUSCULAR | Status: DC | PRN
  Administered 2015-01-03: 10 mg via INTRAVENOUS
  Administered 2015-01-03: 5 mg via INTRAVENOUS

## 2015-01-03 MED ORDER — FENTANYL CITRATE (PF) 100 MCG/2ML IJ SOLN
INTRAMUSCULAR | Status: DC | PRN
  Administered 2015-01-03 (×2): 50 ug via INTRAVENOUS
  Administered 2015-01-03: 25 ug via INTRAVENOUS
  Administered 2015-01-03: 50 ug via INTRAVENOUS

## 2015-01-03 MED ORDER — SODIUM CHLORIDE 0.9 % IR SOLN
Status: DC | PRN
  Administered 2015-01-03: 1000 mL

## 2015-01-03 MED ORDER — GLYCOPYRROLATE 0.2 MG/ML IJ SOLN
INTRAMUSCULAR | Status: DC | PRN
  Administered 2015-01-03: .4 mg via INTRAVENOUS

## 2015-01-03 MED ORDER — 0.9 % SODIUM CHLORIDE (POUR BTL) OPTIME
TOPICAL | Status: DC | PRN
  Administered 2015-01-03: 1000 mL

## 2015-01-03 MED ORDER — ONDANSETRON HCL 4 MG/2ML IJ SOLN
INTRAMUSCULAR | Status: DC | PRN
  Administered 2015-01-03: 4 mg via INTRAVENOUS

## 2015-01-03 MED ORDER — BUPIVACAINE-EPINEPHRINE (PF) 0.25% -1:200000 IJ SOLN
INTRAMUSCULAR | Status: AC
  Filled 2015-01-03: qty 30

## 2015-01-03 MED ORDER — FENTANYL CITRATE (PF) 100 MCG/2ML IJ SOLN
INTRAMUSCULAR | Status: AC
  Filled 2015-01-03: qty 2

## 2015-01-03 MED ORDER — ACETAMINOPHEN 650 MG RE SUPP: 650.0000 mg | Freq: Four times a day (QID) | RECTAL | Status: DC | PRN

## 2015-01-03 MED ORDER — NEOSTIGMINE METHYLSULFATE 10 MG/10ML IV SOLN
INTRAVENOUS | Status: AC
  Filled 2015-01-03: qty 1

## 2015-01-03 MED ORDER — ACETAMINOPHEN 325 MG PO TABS
650.0000 mg | ORAL_TABLET | Freq: Four times a day (QID) | ORAL | Status: DC | PRN
  Administered 2015-01-03: 650 mg via ORAL
  Filled 2015-01-03: qty 2

## 2015-01-03 MED ORDER — CHLORHEXIDINE GLUCONATE 4 % EX LIQD: 1.0000 "application " | Freq: Once | CUTANEOUS | Status: DC

## 2015-01-03 MED ORDER — BUPIVACAINE-EPINEPHRINE 0.25% -1:200000 IJ SOLN
INTRAMUSCULAR | Status: DC | PRN
  Administered 2015-01-03: 8 mL

## 2015-01-03 MED ORDER — GLYCOPYRROLATE 0.2 MG/ML IJ SOLN
INTRAMUSCULAR | Status: AC
Start: 2015-01-03 — End: 2015-01-03
  Filled 2015-01-03: qty 2

## 2015-01-03 MED ORDER — ROCURONIUM BROMIDE 100 MG/10ML IV SOLN
INTRAVENOUS | Status: DC | PRN
  Administered 2015-01-03: 40 mg via INTRAVENOUS

## 2015-01-03 MED ORDER — PROPOFOL 10 MG/ML IV BOLUS
INTRAVENOUS | Status: AC
  Filled 2015-01-03: qty 20

## 2015-01-03 MED ORDER — FENTANYL CITRATE (PF) 250 MCG/5ML IJ SOLN
INTRAMUSCULAR | Status: AC
  Filled 2015-01-03: qty 5

## 2015-01-03 MED ORDER — ENOXAPARIN SODIUM 40 MG/0.4ML ~~LOC~~ SOLN
40.0000 mg | SUBCUTANEOUS | Status: DC
  Administered 2015-01-04: 40 mg via SUBCUTANEOUS
  Filled 2015-01-03: qty 0.4

## 2015-01-03 MED ORDER — PROPOFOL 10 MG/ML IV BOLUS
INTRAVENOUS | Status: AC
Start: 2015-01-03 — End: 2015-01-03
  Filled 2015-01-03: qty 20

## 2015-01-03 MED ORDER — INFLUENZA VAC SPLIT QUAD 0.5 ML IM SUSY
0.5000 mL | PREFILLED_SYRINGE | INTRAMUSCULAR | Status: AC
  Administered 2015-01-04: 0.5 mL via INTRAMUSCULAR
  Filled 2015-01-03: qty 0.5

## 2015-01-03 MED ORDER — LIDOCAINE HCL (CARDIAC) 20 MG/ML IV SOLN
INTRAVENOUS | Status: AC
Start: 2015-01-03 — End: 2015-01-03
  Filled 2015-01-03: qty 5

## 2015-01-03 MED ORDER — LACTATED RINGERS IV SOLN
INTRAVENOUS | Status: DC
  Administered 2015-01-03 – 2015-01-04 (×3): via INTRAVENOUS

## 2015-01-03 MED ORDER — ONDANSETRON 4 MG PO TBDP: 4.0000 mg | ORAL_TABLET | Freq: Four times a day (QID) | ORAL | Status: DC | PRN

## 2015-01-03 MED ORDER — LIDOCAINE HCL (CARDIAC) 20 MG/ML IV SOLN
INTRAVENOUS | Status: DC | PRN
  Administered 2015-01-03: 60 mg via INTRAVENOUS

## 2015-01-03 MED ORDER — DIPHENHYDRAMINE HCL 50 MG/ML IJ SOLN: 12.5000 mg | Freq: Four times a day (QID) | INTRAMUSCULAR | Status: DC | PRN

## 2015-01-03 MED ORDER — PANTOPRAZOLE SODIUM 40 MG PO TBEC
40.0000 mg | DELAYED_RELEASE_TABLET | Freq: Two times a day (BID) | ORAL | Status: DC
  Administered 2015-01-03 – 2015-01-04 (×4): 40 mg via ORAL
  Filled 2015-01-03 (×4): qty 1

## 2015-01-03 MED ORDER — PROPOFOL 10 MG/ML IV BOLUS
INTRAVENOUS | Status: DC | PRN
  Administered 2015-01-03: 100 mg via INTRAVENOUS
  Administered 2015-01-03: 50 mg via INTRAVENOUS

## 2015-01-03 MED ORDER — MORPHINE SULFATE (PF) 2 MG/ML IV SOLN
INTRAVENOUS | Status: AC
  Filled 2015-01-03: qty 1

## 2015-01-03 MED ORDER — FLUTICASONE PROPIONATE 50 MCG/ACT NA SUSP
2.0000 | Freq: Every day | NASAL | Status: DC
  Filled 2015-01-03: qty 16

## 2015-01-03 MED ORDER — FENTANYL CITRATE (PF) 100 MCG/2ML IJ SOLN
25.0000 ug | INTRAMUSCULAR | Status: DC | PRN
  Administered 2015-01-03: 25 ug via INTRAVENOUS
  Administered 2015-01-03: 50 ug via INTRAVENOUS
  Administered 2015-01-03: 25 ug via INTRAVENOUS

## 2015-01-03 MED ORDER — LORAZEPAM 1 MG PO TABS
1.0000 mg | ORAL_TABLET | Freq: Three times a day (TID) | ORAL | Status: DC | PRN
  Administered 2015-01-04 (×2): 1 mg via ORAL
  Filled 2015-01-03 (×2): qty 1

## 2015-01-03 MED ORDER — METOPROLOL SUCCINATE ER 25 MG PO TB24
12.5000 mg | ORAL_TABLET | Freq: Every day | ORAL | Status: DC
Start: 2015-01-03 — End: 2015-01-05
  Administered 2015-01-03 – 2015-01-04 (×2): 12.5 mg via ORAL
  Filled 2015-01-03 (×2): qty 1

## 2015-01-03 MED ORDER — ONDANSETRON HCL 4 MG/2ML IJ SOLN
INTRAMUSCULAR | Status: AC
  Filled 2015-01-03: qty 2

## 2015-01-03 MED ORDER — LACTATED RINGERS IV SOLN
INTRAVENOUS | Status: DC | PRN
  Administered 2015-01-03 (×2): via INTRAVENOUS

## 2015-01-03 MED ORDER — DIPHENHYDRAMINE HCL 12.5 MG/5ML PO ELIX
12.5000 mg | ORAL_SOLUTION | Freq: Four times a day (QID) | ORAL | Status: DC | PRN
Start: 2015-01-03 — End: 2015-01-05

## 2015-01-03 MED ORDER — TETRAHYDROZOLINE HCL 0.05 % OP SOLN
2.0000 [drp] | OPHTHALMIC | Status: DC | PRN
  Filled 2015-01-03: qty 15

## 2015-01-03 MED ORDER — AMIODARONE HCL 200 MG PO TABS
100.0000 mg | ORAL_TABLET | Freq: Every day | ORAL | Status: DC
  Administered 2015-01-04: 100 mg via ORAL
  Filled 2015-01-03: qty 1

## 2015-01-03 MED ORDER — MORPHINE SULFATE (PF) 2 MG/ML IV SOLN
2.0000 mg | INTRAVENOUS | Status: DC | PRN
  Administered 2015-01-03 – 2015-01-04 (×3): 2 mg via INTRAVENOUS
  Filled 2015-01-03 (×3): qty 1

## 2015-01-03 MED ORDER — TRAMADOL HCL 50 MG PO TABS
50.0000 mg | ORAL_TABLET | Freq: Four times a day (QID) | ORAL | Status: DC | PRN
  Administered 2015-01-03 – 2015-01-04 (×3): 50 mg via ORAL
  Filled 2015-01-03 (×3): qty 1

## 2015-01-03 MED ORDER — AMIODARONE HCL 200 MG PO TABS
200.0000 mg | ORAL_TABLET | Freq: Once | ORAL | Status: AC
  Administered 2015-01-03: 100 mg via ORAL
  Filled 2015-01-03: qty 1

## 2015-01-03 MED ORDER — LOSARTAN POTASSIUM 50 MG PO TABS
50.0000 mg | ORAL_TABLET | Freq: Two times a day (BID) | ORAL | Status: DC
  Administered 2015-01-03 – 2015-01-04 (×4): 50 mg via ORAL
  Filled 2015-01-03 (×5): qty 1

## 2015-01-03 MED ORDER — ONDANSETRON HCL 4 MG/2ML IJ SOLN
4.0000 mg | Freq: Four times a day (QID) | INTRAMUSCULAR | Status: DC | PRN
  Administered 2015-01-03: 4 mg via INTRAVENOUS
  Filled 2015-01-03: qty 2

## 2015-01-03 MED ORDER — ROCURONIUM BROMIDE 50 MG/5ML IV SOLN
INTRAVENOUS | Status: AC
  Filled 2015-01-03: qty 1

## 2015-01-03 SURGICAL SUPPLY — 48 items
APL SKNCLS STERI-STRIP NONHPOA (GAUZE/BANDAGES/DRESSINGS) ×1
APPLIER CLIP ROT 10 11.4 M/L (STAPLE) ×3
APR CLP MED LRG 11.4X10 (STAPLE) ×1
BAG SPEC RTRVL LRG 6X4 10 (ENDOMECHANICALS) ×1
BENZOIN TINCTURE PRP APPL 2/3 (GAUZE/BANDAGES/DRESSINGS) ×3 IMPLANT
BLADE SURG ROTATE 9660 (MISCELLANEOUS) IMPLANT
CANISTER SUCTION 2500CC (MISCELLANEOUS) ×3 IMPLANT
CHLORAPREP W/TINT 26ML (MISCELLANEOUS) ×3 IMPLANT
CLIP APPLIE ROT 10 11.4 M/L (STAPLE) ×1 IMPLANT
COVER MAYO STAND STRL (DRAPES) ×3 IMPLANT
COVER SURGICAL LIGHT HANDLE (MISCELLANEOUS) ×3 IMPLANT
DRAPE C-ARM 42X72 X-RAY (DRAPES) ×3 IMPLANT
DRSG TEGADERM 2-3/8X2-3/4 SM (GAUZE/BANDAGES/DRESSINGS) ×9 IMPLANT
DRSG TEGADERM 4X4.75 (GAUZE/BANDAGES/DRESSINGS) ×3 IMPLANT
ELECT REM PT RETURN 9FT ADLT (ELECTROSURGICAL) ×3
ELECTRODE REM PT RTRN 9FT ADLT (ELECTROSURGICAL) ×1 IMPLANT
FILTER SMOKE EVAC LAPAROSHD (FILTER) ×3 IMPLANT
GAUZE SPONGE 2X2 8PLY STRL LF (GAUZE/BANDAGES/DRESSINGS) ×1 IMPLANT
GLOVE BIO SURGEON STRL SZ7 (GLOVE) ×3 IMPLANT
GLOVE BIO SURGEON STRL SZ7.5 (GLOVE) ×2 IMPLANT
GLOVE BIOGEL PI IND STRL 6.5 (GLOVE) IMPLANT
GLOVE BIOGEL PI IND STRL 7.0 (GLOVE) IMPLANT
GLOVE BIOGEL PI IND STRL 7.5 (GLOVE) ×1 IMPLANT
GLOVE BIOGEL PI INDICATOR 6.5 (GLOVE) ×2
GLOVE BIOGEL PI INDICATOR 7.0 (GLOVE) ×4
GLOVE BIOGEL PI INDICATOR 7.5 (GLOVE) ×4
GLOVE ECLIPSE 6.5 STRL STRAW (GLOVE) ×4 IMPLANT
GOWN STRL REUS W/ TWL LRG LVL3 (GOWN DISPOSABLE) ×3 IMPLANT
GOWN STRL REUS W/TWL LRG LVL3 (GOWN DISPOSABLE) ×9
KIT BASIN OR (CUSTOM PROCEDURE TRAY) ×3 IMPLANT
KIT ROOM TURNOVER OR (KITS) ×3 IMPLANT
NS IRRIG 1000ML POUR BTL (IV SOLUTION) ×3 IMPLANT
PAD ARMBOARD 7.5X6 YLW CONV (MISCELLANEOUS) ×3 IMPLANT
POUCH SPECIMEN RETRIEVAL 10MM (ENDOMECHANICALS) ×3 IMPLANT
SCISSORS LAP 5X35 DISP (ENDOMECHANICALS) ×3 IMPLANT
SET CHOLANGIOGRAPH 5 50 .035 (SET/KITS/TRAYS/PACK) ×3 IMPLANT
SET IRRIG TUBING LAPAROSCOPIC (IRRIGATION / IRRIGATOR) ×3 IMPLANT
SLEEVE ENDOPATH XCEL 5M (ENDOMECHANICALS) ×3 IMPLANT
SPECIMEN JAR SMALL (MISCELLANEOUS) ×3 IMPLANT
SPONGE GAUZE 2X2 STER 10/PKG (GAUZE/BANDAGES/DRESSINGS) ×2
SUT MNCRL AB 4-0 PS2 18 (SUTURE) ×3 IMPLANT
TOWEL OR 17X24 6PK STRL BLUE (TOWEL DISPOSABLE) ×3 IMPLANT
TOWEL OR 17X26 10 PK STRL BLUE (TOWEL DISPOSABLE) ×3 IMPLANT
TRAY LAPAROSCOPIC MC (CUSTOM PROCEDURE TRAY) ×3 IMPLANT
TROCAR XCEL BLUNT TIP 100MML (ENDOMECHANICALS) ×3 IMPLANT
TROCAR XCEL NON-BLD 11X100MML (ENDOMECHANICALS) ×3 IMPLANT
TROCAR XCEL NON-BLD 5MMX100MML (ENDOMECHANICALS) ×3 IMPLANT
TUBING INSUFFLATION (TUBING) ×3 IMPLANT

## 2015-01-03 NOTE — H&P (Signed)
History of Present Illness  Patient words: GB.  The patient is a 81 year old female who presents for evaluation of gall stones. Referred by Dr. Gildardo Cranker for evaluation of gallbladder symptoms.  This is an 81 year old female who presents with a two-month history of intermittent upper abdominal pain. This pain is intermittent and still has to be exacerbated by eating greasy foods. The pain sometimes migrates to her back. Her symptoms are associated with abdominal bloating as well as nausea. She denies any vomiting or diarrhea. Her cardiologist is Dr. Mariann Laster. She was seen earlier this year in his office. After she presented with these symptoms, she underwent an ultrasound which showed some gallbladder sludge and one single non-shadowing gallstone. The common bile duct has some echogenic intraluminal material. Her last liver function test in April of this year were unremarkable. The patient is currently asymptomatic. She presents today to discuss elective surgery.   CLINICAL DATA: Generalized abdominal pain associated with nausea and constipation  EXAM: US ABDOMEN LIMITED - RIGHT UPPER QUADRANT  COMPARISON: Abdominal and pelvic CT scan of June 08, 2012  FINDINGS: Gallbladder:  The gallbladder is adequately distended. There is a small amount of sludge demonstrated. No typical mobile shadowing stones are demonstrated. There is a 9 mm non shadowing focus in the gallbladder neck which may reflect a noncalcified stone. There is no gallbladder wall thickening, pericholecystic fluid, or positive sonographic Murphy's sign.  Common bile duct:  Diameter: 2.5 mm. There are internal echoes within the common bile duct that may reflect sludge or tiny non shadowing stones.  Liver:  The hepatic echotexture is mildly increased. There is no focal mass or ductal dilation.  IMPRESSION: 1. Gallbladder sludge and likely tiny stones. One stone appears to lie in the  gallbladder neck and does not move. 2. Normal caliber common bile duct with echogenic intraluminal material suggesting tiny stones or sludge. 3. Fatty infiltrative change of the liver. 4. MRCP may be useful for further evaluation of the common bile duct and gallbladder.   Electronically Signed By: David Martinique M.D. On: 11/11/2014 16:25 Other Problems  Anxiety Disorder Atrial Fibrillation Bladder Problems Cerebrovascular Accident Cholelithiasis Depression Gastric Ulcer Gastroesophageal Reflux Disease Heart murmur High blood pressure Hypercholesterolemia Migraine Headache Myocardial infarction Oophorectomy Right. Transfusion history  Past Surgical History  Cataract Surgery Bilateral. Coronary Artery Bypass Graft Hysterectomy (not due to cancer) - Partial  Diagnostic Studies History  Colonoscopy 5-10 years ago Mammogram 1-3 years ago Pap Smear 1-5 years ago  Allergies  Hydrocodone-Acetaminophen *ANALGESICS - OPIOID* Statins Ibuprofen *ANALGESICS - ANTI-INFLAMMATORY* OxyCODONE HCl (Abuse Deter) *ANALGESICS - OPIOID* Penicillins PredniSONE (Pak) *CORTICOSTEROIDS*  Medication History  Amiodarone HCl (200MG  Tablet, Oral) Active. Eliquis (5MG  Tablet, Oral) Active. Fluticasone Propionate (50MCG/ACT Suspension, Nasal) Active. LORazepam (1MG  Tablet, Oral) Active. Metoprolol Succinate ER (25MG  Tablet ER 24HR, Oral) Active. Pantoprazole Sodium (40MG  Tablet DR, Oral) Active. Sucralfate (1GM Tablet, Oral) Active. Zolpidem Tartrate (10MG  Tablet, Oral) Active. Losartan Potassium (50MG  Tablet, Oral) Active. Tylenol Extra Strength (500MG  Tablet, Oral) Active. Flaxseed Oil (1000MG  Capsule, Oral) Active. Multiple Vitamin (Oral) Active. Niacin (500MG  Tablet, Oral) Active. Fish Oil (1000MG  Capsule, Oral) Active. Carafate (1GM Tablet, Oral) Active. Medications Reconciled  Social History  Alcohol use Remotely quit alcohol use. No  caffeine use No drug use Tobacco use Never smoker.  Family History  Breast Cancer Daughter. Cancer Sister. Heart Disease Father, Mother, Sister. Heart disease in female family member before age 47 Hypertension Sister.  Pregnancy / Birth History  Age at menarche 66  years. Age of menopause 82-50 Gravida 5 Maternal age 72-25 Para 3     Review of Systems General Not Present- Appetite Loss, Chills, Fatigue, Fever, Night Sweats, Weight Gain and Weight Loss. Skin Not Present- Change in Wart/Mole, Dryness, Hives, Jaundice, New Lesions, Non-Healing Wounds, Rash and Ulcer. HEENT Present- Hoarseness. Not Present- Earache, Hearing Loss, Nose Bleed, Oral Ulcers, Ringing in the Ears, Seasonal Allergies, Sinus Pain, Sore Throat, Visual Disturbances, Wears glasses/contact lenses and Yellow Eyes. Respiratory Not Present- Bloody sputum, Chronic Cough, Difficulty Breathing, Snoring and Wheezing. Breast Not Present- Breast Mass, Breast Pain, Nipple Discharge and Skin Changes. Cardiovascular Present- Rapid Heart Rate and Swelling of Extremities. Not Present- Chest Pain, Difficulty Breathing Lying Down, Leg Cramps, Palpitations and Shortness of Breath. Gastrointestinal Present- Nausea. Not Present- Abdominal Pain, Bloating, Bloody Stool, Change in Bowel Habits, Chronic diarrhea, Constipation, Difficulty Swallowing, Excessive gas, Gets full quickly at meals, Hemorrhoids, Indigestion, Rectal Pain and Vomiting. Female Genitourinary Present- Frequency and Urgency. Not Present- Nocturia, Painful Urination and Pelvic Pain. Musculoskeletal Present- Swelling of Extremities. Not Present- Back Pain, Joint Pain, Joint Stiffness, Muscle Pain and Muscle Weakness. Neurological Not Present- Decreased Memory, Fainting, Headaches, Numbness, Seizures, Tingling, Tremor, Trouble walking and Weakness. Psychiatric Present- Anxiety, Depression and Frequent crying. Not Present- Bipolar, Change in Sleep Pattern and  Fearful. Endocrine Present- Cold Intolerance and Heat Intolerance. Not Present- Excessive Hunger, Hair Changes, Hot flashes and New Diabetes. Hematology Present- Easy Bruising. Not Present- Excessive bleeding, Gland problems, HIV and Persistent Infections.  Vitals 11/22/2014 11:24 AM Weight: 147.2 lb Height: 68in Body Surface Area: 1.79 m Body Mass Index: 22.38 kg/m Temp.: 98.66F(Oral)  Pulse: 80 (Regular)  BP: 132/62 (Sitting, Left Arm, Standard)     Physical Exam  The physical exam findings are as follows: Note:WDWN in NAD HEENT: EOMI, sclera anicteric Neck: No masses, no thyromegaly Lungs: CTA bilaterally; normal respiratory effort CV: Regular rate and rhythm; no murmurs Abd: +bowel sounds, soft, mildly distended; tender in RUQ and epigastrium; no palpable masses Ext: Well-perfused; no edema Skin: Warm, dry; no sign of jaundice    Assessment & Plan  CHRONIC CHOLECYSTITIS WITH CALCULUS (574.10  K80.10)  Current Plans Schedule for Surgery - Laparoscopic cholecystectomy with intraoperative cholangiogram. The surgical procedure has been discussed with the patient. Potential risks, benefits, alternative treatments, and expected outcomes have been explained. All of the patient's questions at this time have been answered. The likelihood of reaching the patient's treatment goal is good. The patient understand the proposed surgical procedure and wishes to proceed. Note:Cardiac clearance - Dr. Benjamine Mola Hold Eliquis 5 days  Imogene Burn. Georgette Dover, MD, [facility]  General/ Trauma Surgery  01/03/2015 7:17 AM

## 2015-01-03 NOTE — Anesthesia Procedure Notes (Signed)
Procedure Name: Intubation Date/Time: 01/03/2015 9:51 AM Performed by: Garrison Columbus T Pre-anesthesia Checklist: Patient identified, Emergency Drugs available, Suction available, Patient being monitored and Timeout performed Patient Re-evaluated:Patient Re-evaluated prior to inductionOxygen Delivery Method: Circle system utilized Preoxygenation: Pre-oxygenation with 100% oxygen Intubation Type: IV induction Ventilation: Mask ventilation without difficulty and Oral airway inserted - appropriate to patient size Laryngoscope Size: Sabra Heck and 2 Grade View: Grade I Tube type: Oral Tube size: 7.0 mm Number of attempts: 1 Airway Equipment and Method: Stylet Placement Confirmation: ETT inserted through vocal cords under direct vision,  positive ETCO2 and breath sounds checked- equal and bilateral Secured at: 21 cm Tube secured with: Tape Dental Injury: Teeth and Oropharynx as per pre-operative assessment  Comments: Intubation by Joylene Igo, SRNA

## 2015-01-03 NOTE — Anesthesia Preprocedure Evaluation (Addendum)
Anesthesia Evaluation  Patient identified by MRN, date of birth, ID band Patient awake    Reviewed: Allergy & Precautions, NPO status , Patient's Chart, lab work & pertinent test results  History of Anesthesia Complications (+) PONV and history of anesthetic complications  Airway Mallampati: II  TM Distance: >3 FB Neck ROM: Full    Dental  (+) Dental Advisory Given, Partial Upper   Pulmonary neg pulmonary ROS,  breath sounds clear to auscultation        Cardiovascular hypertension, Pt. on medications and Pt. on home beta blockers + CAD, + Past MI, + CABG and + Peripheral Vascular Disease + dysrhythmias Atrial Fibrillation Rhythm:Regular Rate:Normal     Neuro/Psych  Headaches, PSYCHIATRIC DISORDERS Anxiety TIACVA    GI/Hepatic hiatal hernia, PUD, GERD-  Medicated,  Endo/Other    Renal/GU      Musculoskeletal  (+) Arthritis -,   Abdominal   Peds  Hematology   Anesthesia Other Findings   Reproductive/Obstetrics                          Anesthesia Physical Anesthesia Plan  ASA: III  Anesthesia Plan: General   Post-op Pain Management:    Induction: Intravenous  Airway Management Planned: Oral ETT  Additional Equipment:   Intra-op Plan:   Post-operative Plan: Extubation in OR  Informed Consent: I have reviewed the patients History and Physical, chart, labs and discussed the procedure including the risks, benefits and alternatives for the proposed anesthesia with the patient or authorized representative who has indicated his/her understanding and acceptance.   Dental advisory given  Plan Discussed with: CRNA, Anesthesiologist and Surgeon  Anesthesia Plan Comments:         Anesthesia Quick Evaluation

## 2015-01-03 NOTE — Op Note (Signed)
Laparoscopic Cholecystectomy with IOC Procedure Note  Indications: This patient presents with symptomatic gallbladder disease and will undergo laparoscopic cholecystectomy.  Pre-operative Diagnosis: Calculus of gallbladder with other cholecystitis, without mention of obstruction  Post-operative Diagnosis: Same  Surgeon: Tyquisha Sharps K.   Assistants: Sharyn Dross, RNFA  Anesthesia: General endotracheal anesthesia  ASA Class: 3  Procedure Details  The patient was seen again in the Holding Room. The risks, benefits, complications, treatment options, and expected outcomes were discussed with the patient. The possibilities of reaction to medication, pulmonary aspiration, perforation of viscus, bleeding, recurrent infection, finding a normal gallbladder, the need for additional procedures, failure to diagnose a condition, the possible need to convert to an open procedure, and creating a complication requiring transfusion or operation were discussed with the patient. The likelihood of improving the patient's symptoms with return to their baseline status is good.  The patient and/or family concurred with the proposed plan, giving informed consent. The site of surgery properly noted. The patient was taken to Operating Room, identified as Marissa Bowen and the procedure verified as Laparoscopic Cholecystectomy with Intraoperative Cholangiogram. A Time Out was held and the above information confirmed.  Prior to the induction of general anesthesia, antibiotic prophylaxis was administered. General endotracheal anesthesia was then administered and tolerated well. After the induction, the abdomen was prepped with Chloraprep and draped in the sterile fashion. The patient was positioned in the supine position.  Local anesthetic agent was injected into the skin below the umbilicus and an incision made. We dissected down to the abdominal fascia with blunt dissection.  The fascia was incised vertically and we  entered the peritoneal cavity bluntly.  A pursestring suture of 0-Vicryl was placed around the fascial opening.  The Hasson cannula was inserted and secured with the stay suture.  Pneumoperitoneum was then created with CO2 and tolerated well without any adverse changes in the patient's vital signs. An 11-mm port was placed in the subxiphoid position.  Two 5-mm ports were placed in the right upper quadrant. All skin incisions were infiltrated with a local anesthetic agent before making the incision and placing the trocars.   We positioned the patient in reverse Trendelenburg, tilted slightly to the patient's left.  The gallbladder was identified, the fundus grasped and retracted cephalad.  There were omental adhesions to the surface of the gallbladder. Adhesions were lysed bluntly and with the electrocautery where indicated, taking care not to injure any adjacent organs or viscus. The infundibulum was grasped and retracted laterally, exposing the peritoneum overlying the triangle of Calot. This was then divided and exposed in a blunt fashion. A critical view of the cystic duct and cystic artery was obtained.  The cystic duct was clearly identified and bluntly dissected circumferentially. The cystic duct was ligated with a clip distally.   An incision was made in the cystic duct and the [facility] cholangiogram catheter introduced. The catheter was secured using a clip. A cholangiogram was then obtained which showed good visualization of the distal and proximal biliary tree with no sign of filling defects or obstruction.  Contrast flowed easily into the duodenum. The catheter was then removed.   The cystic duct was then ligated with clips and divided. The cystic artery was identified, dissected free, ligated with clips and divided as well.   The gallbladder was dissected from the liver bed in retrograde fashion with the electrocautery. The gallbladder was removed and placed in an Endocatch sac. The liver bed was  irrigated and inspected. Hemostasis was achieved  with the electrocautery. Copious irrigation was utilized and was repeatedly aspirated until clear.  The gallbladder and Endocatch sac were then removed through the umbilical port site.  The pursestring suture was used to close the umbilical fascia.    We again inspected the right upper quadrant for hemostasis.  Pneumoperitoneum was released as we removed the trocars.  4-0 Monocryl was used to close the skin.   Benzoin, steri-strips, and clean dressings were applied. The patient was then extubated and brought to the recovery room in stable condition. Instrument, sponge, and needle counts were correct at closure and at the conclusion of the case.   Findings: Cholecystitis with Cholelithiasis  Estimated Blood Loss: Minimal         Drains: none         Specimens: Gallbladder           Complications: None; patient tolerated the procedure well.         Disposition: PACU - hemodynamically stable.         Condition: stable  Imogene Burn. Georgette Dover, MD, [facility]  General/ Trauma Surgery  01/03/2015 10:53 AM

## 2015-01-03 NOTE — Transfer of Care (Signed)
Immediate Anesthesia Transfer of Care Note  Patient: Marissa Bowen  Procedure(s) Performed: Procedure(s): LAPAROSCOPIC CHOLECYSTECTOMY WITH INTRAOPERATIVE CHOLANGIOGRAM (N/A)  Patient Location: PACU  Anesthesia Type:General  Level of Consciousness: awake and lethargic  Airway & Oxygen Therapy: Patient Spontanous Breathing and Patient connected to nasal cannula oxygen  Post-op Assessment: Report given to RN, Post -op Vital signs reviewed and stable and Patient moving all extremities X 4  Post vital signs: Reviewed and stable  Last Vitals:  Filed Vitals:   01/03/15 0809  BP: 355/21    Complications: No apparent anesthesia complications

## 2015-01-04 ENCOUNTER — Encounter (HOSPITAL_COMMUNITY): Payer: Self-pay | Admitting: Surgery

## 2015-01-04 DIAGNOSIS — K8010 Calculus of gallbladder with chronic cholecystitis without obstruction: Secondary | ICD-10-CM | POA: Diagnosis not present

## 2015-01-04 DIAGNOSIS — I4891 Unspecified atrial fibrillation: Secondary | ICD-10-CM | POA: Diagnosis not present

## 2015-01-04 DIAGNOSIS — Z7901 Long term (current) use of anticoagulants: Secondary | ICD-10-CM | POA: Diagnosis not present

## 2015-01-04 DIAGNOSIS — K5900 Constipation, unspecified: Secondary | ICD-10-CM | POA: Diagnosis not present

## 2015-01-04 DIAGNOSIS — Z880 Allergy status to penicillin: Secondary | ICD-10-CM | POA: Diagnosis not present

## 2015-01-04 DIAGNOSIS — Z951 Presence of aortocoronary bypass graft: Secondary | ICD-10-CM | POA: Diagnosis not present

## 2015-01-04 LAB — CBC
HCT: 37.4 % (ref 36.0–46.0)
Hemoglobin: 12.1 g/dL (ref 12.0–15.0)
MCH: 30.3 pg (ref 26.0–34.0)
MCHC: 32.4 g/dL (ref 30.0–36.0)
MCV: 93.5 fL (ref 78.0–100.0)
PLATELETS: 182 10*3/uL (ref 150–400)
RBC: 4 MIL/uL (ref 3.87–5.11)
RDW: 13.7 % (ref 11.5–15.5)
WBC: 19.9 10*3/uL — AB (ref 4.0–10.5)

## 2015-01-04 LAB — COMPREHENSIVE METABOLIC PANEL
ALBUMIN: 3.1 g/dL — AB (ref 3.5–5.0)
ALK PHOS: 53 U/L (ref 38–126)
ALT: 26 U/L (ref 14–54)
AST: 31 U/L (ref 15–41)
Anion gap: 9 (ref 5–15)
BILIRUBIN TOTAL: 1 mg/dL (ref 0.3–1.2)
BUN: 9 mg/dL (ref 6–20)
CALCIUM: 8.5 mg/dL — AB (ref 8.9–10.3)
CO2: 28 mmol/L (ref 22–32)
Chloride: 96 mmol/L — ABNORMAL LOW (ref 101–111)
Creatinine, Ser: 0.63 mg/dL (ref 0.44–1.00)
GFR calc Af Amer: >60 mL/min (ref >60)
GLUCOSE: 144 mg/dL — AB (ref 65–99)
Potassium: 3.6 mmol/L (ref 3.5–5.1)
Sodium: 133 mmol/L — ABNORMAL LOW (ref 135–145)
TOTAL PROTEIN: 5.9 g/dL — AB (ref 6.5–8.1)

## 2015-01-04 MED ORDER — SIMETHICONE 80 MG PO CHEW
80.0000 mg | CHEWABLE_TABLET | Freq: Four times a day (QID) | ORAL | Status: DC | PRN
  Administered 2015-01-04 (×2): 80 mg via ORAL
  Filled 2015-01-04 (×2): qty 1

## 2015-01-04 MED ORDER — TRAMADOL HCL 50 MG PO TABS
100.0000 mg | ORAL_TABLET | Freq: Four times a day (QID) | ORAL | Status: DC | PRN
  Administered 2015-01-04: 100 mg via ORAL
  Filled 2015-01-04: qty 2

## 2015-01-04 NOTE — Discharge Instructions (Addendum)
[facility] °LAPAROSCOPIC SURGERY: POST OP INSTRUCTIONS °Always review your discharge instruction sheet given to you by the facility where your surgery was performed. °IF YOU HAVE DISABILITY OR FAMILY LEAVE FORMS, YOU MUST BRING THEM TO THE OFFICE FOR PROCESSING.   °DO NOT GIVE THEM TO YOUR DOCTOR. ° °1. A prescription for pain medication will be given to you upon discharge.  Take your pain medication as prescribed, if needed.  If narcotic pain medicine is not needed, then you may take acetaminophen (Tylenol) or ibuprofen (Advil) as needed. °2. Take your usually prescribed medications unless otherwise directed. °3. If you need a refill on your pain medication, please contact your pharmacy.  They will contact our office to request authorization. Prescriptions will not be filled after 5pm or on week-ends. °4. You should follow a light diet the first few days after arrival home, such as soup and crackers, etc.  Be sure to include lots of fluids daily. °5. Most patients will experience some swelling and bruising in the area of the incisions.  Ice packs will help.  Swelling and bruising can take several days to resolve.  °6. It is common to experience some constipation if taking pain medication after surgery.  Increasing fluid intake and taking a stool softener (such as Colace) will usually help or prevent this problem from occurring.  A mild laxative (Milk of Magnesia or Miralax) should be taken according to package instructions if there are no bowel movements after 48 hours. °7. Unless discharge instructions indicate otherwise, you may remove your bandages 48 hours after surgery, and you may shower at that time.  You will have steri-strips (small skin tapes) in place directly over the incision.  These strips should be left on the skin for 7-10 days.  If your surgeon used skin glue on the incision, you may shower in 24 hours.  The glue will flake off over the next 2-3 weeks.  Any sutures or staples  will be removed at the office during your follow-up visit. °8. ACTIVITIES:  You may resume regular (light) daily activities beginning the next day--such as daily self-care, walking, climbing stairs--gradually increasing activities as tolerated.  You may have sexual intercourse when it is comfortable.  Refrain from any heavy lifting or straining until approved by your doctor. °a. You may drive when you are no longer taking prescription pain medication, you can comfortably wear a seatbelt, and you can safely maneuver your car and apply brakes. °b. RETURN TO WORK:   2-3 weeks °9. You should see your doctor in the office for a follow-up appointment approximately 2-3 weeks after your surgery.  Make sure that you call for this appointment within a day or two after you arrive home to insure a convenient appointment time. °10. OTHER INSTRUCTIONS: ________________________________________________________________________ °WHEN TO CALL YOUR DOCTOR: °1. Fever over 101.0 °2. Inability to urinate °3. Continued bleeding from incision. °4. Increased pain, redness, or drainage from the incision. °5. Increasing abdominal pain ° °The clinic staff is available to answer your questions during regular business hours.  Please don’t hesitate to call and ask to speak to one of the nurses for clinical concerns.  If you have a medical emergency, go to the nearest emergency room or call 911.  A surgeon from [facility] is always on call at the hospital. °[address], [address], [place], [place]  [address] ? [address] [address], [place], [place]   [address] °[phone] ? [phone] ? FAX [phone] °Web site:   [email or web address]    Information on my medicine - ELIQUIS (apixaban)  This medication education was reviewed with me or my healthcare representative as part of my discharge preparation.  Why was Eliquis prescribed for you? Eliquis was prescribed for you to reduce the risk of a blood clot forming  that can cause a stroke if you have a medical condition called atrial fibrillation (a type of irregular heartbeat).  What do You need to know about Eliquis ? Take your Eliquis TWICE DAILY - one tablet in the morning and one tablet in the evening with or without food. If you have difficulty swallowing the tablet whole please discuss with your pharmacist how to take the medication safely.  Take Eliquis exactly as prescribed by your doctor and DO NOT stop taking Eliquis without talking to the doctor who prescribed the medication.  Stopping may increase your risk of developing a stroke.  Refill your prescription before you run out.  After discharge, you should have regular check-up appointments with your healthcare provider that is prescribing your Eliquis.  In the future your dose may need to be changed if your kidney function or weight changes by a significant amount or as you get older.  What do you do if you miss a dose? If you miss a dose, take it as soon as you remember on the same day and resume taking twice daily.  Do not take more than one dose of ELIQUIS at the same time to make up a missed dose.  Important Safety Information A possible side effect of Eliquis is bleeding. You should call your healthcare provider right away if you experience any of the following: ? Bleeding from an injury or your nose that does not stop. ? Unusual colored urine (red or dark brown) or unusual colored stools (red or black). ? Unusual bruising for unknown reasons. ? A serious fall or if you hit your head (even if there is no bleeding).  Some medicines may interact with Eliquis and might increase your risk of bleeding or clotting while on Eliquis. To help avoid this, consult your healthcare provider or pharmacist prior to using any new prescription or non-prescription medications, including herbals, vitamins, non-steroidal anti-inflammatory drugs (NSAIDs) and supplements.  This website has more  information on Eliquis (apixaban): http://www.eliquis.com/eliquis/home

## 2015-01-04 NOTE — Progress Notes (Signed)
Patient ID: Marissa Bowen, female   DOB: 1926-06-17, 81 y.o.   MRN: [id number]  Patient still requiring IV pain medication for RUQ tenderness.  Too tender to go home today. Filed Vitals:   01/04/15 0435  BP: 142/62  Pulse: 66  Temp: 97.9 F (36.6 C)  Resp: 17    Will keep overnight.  Possible discharge tomorrow.  Imogene Burn. Georgette Dover, MD, [facility]  General/ Trauma Surgery  01/04/2015 2:12 PM

## 2015-01-04 NOTE — Progress Notes (Signed)
1 Day Post-Op  Subjective: Patient is quite sore this morning. Tolerating PO's without nausea WBC elevated Has not yet ambulated  Objective: Vital signs in last 24 hours: Temp:  [97.5 F (36.4 C)-98 F (36.7 C)] 97.9 F (36.6 C) (09/07 0435) Pulse Rate:  [58-66] 66 (09/07 0435) Resp:  [10-26] 17 (09/07 0435) BP: (131-197)/(49-98) 142/62 mmHg (09/07 0435) SpO2:  [93 %-99 %] 94 % (09/07 0435)    Intake/Output from previous day: 09/06 0701 - 09/07 0700 In: 1270 [P.O.:120; I.V.:1150] Out: 95 [Urine:75; Blood:20] Intake/Output this shift:    General appearance: alert, cooperative and no distress Resp: clear to auscultation bilaterally Cardio: regular rate and rhythm, S1, S2 normal, no murmur, click, rub or gallop GI: soft, incisional tenderness Umbilical dressing with some dried drainage  Lab Results:   Recent Labs  01/03/15 1450 01/04/15 0400  WBC 14.0* 19.9*  HGB 13.9 12.1  HCT 40.8 37.4  PLT 170 182   BMET  Recent Labs  01/03/15 1450 01/04/15 0400  NA  --  133*  K  --  3.6  CL  --  96*  CO2  --  28  GLUCOSE  --  144*  BUN  --  9  CREATININE 0.66 0.63  CALCIUM  --  8.5*   PT/INR  Recent Labs  01/03/15 0743  LABPROT 14.3  INR 1.09   ABG No results for input(s): PHART, HCO3 in the last 72 hours.  Invalid input(s): PCO2, PO2  Studies/Results: Dg Cholangiogram Operative  01/03/2015   CLINICAL DATA:  Chronic cholecystitis with calculus  EXAM: INTRAOPERATIVE CHOLANGIOGRAM  TECHNIQUE: Cholangiographic images from the C-arm fluoroscopic device were submitted for interpretation post-operatively. Please see the procedural report for the amount of contrast and the fluoroscopy time utilized.  COMPARISON:  Ultrasound 11/11/2014  FINDINGS: No persistent filling defects in the common duct. Intrahepatic ducts are incompletely visualized, appearing decompressed centrally. Contrast passes into the duodenum.  : Negative for retained common duct stone.    Electronically Signed   By: Lucrezia Europe M.D.   On: 01/03/2015 11:10    Anti-infectives: Anti-infectives    Start     Dose/Rate Route Frequency Ordered Stop   01/03/15 0915  vancomycin (VANCOCIN) IVPB 1000 mg/200 mL premix     1,000 mg 200 mL/hr over 60 Minutes Intravenous To [facility] 01/02/15 1437 01/03/15 1020   01/03/15 0600  ceFAZolin (ANCEF) IVPB 2 g/50 mL premix  Status:  Discontinued     2 g 100 mL/hr over 30 Minutes Intravenous On call to O.R. 01/02/15 1432 01/02/15 1432      Assessment/Plan: s/p Procedure(s): LAPAROSCOPIC CHOLECYSTECTOMY WITH INTRAOPERATIVE CHOLANGIOGRAM (N/A) Encourage ambulation  PO pain meds Will recheck this afternoon to see if she is ready for discharge.     Adiel Mcnamara K. 01/04/2015

## 2015-01-05 ENCOUNTER — Telehealth: Payer: Self-pay | Admitting: *Deleted

## 2015-01-05 DIAGNOSIS — K5900 Constipation, unspecified: Secondary | ICD-10-CM | POA: Diagnosis not present

## 2015-01-05 DIAGNOSIS — Z880 Allergy status to penicillin: Secondary | ICD-10-CM | POA: Diagnosis not present

## 2015-01-05 DIAGNOSIS — K8010 Calculus of gallbladder with chronic cholecystitis without obstruction: Secondary | ICD-10-CM | POA: Diagnosis not present

## 2015-01-05 DIAGNOSIS — I4891 Unspecified atrial fibrillation: Secondary | ICD-10-CM | POA: Diagnosis not present

## 2015-01-05 DIAGNOSIS — Z7901 Long term (current) use of anticoagulants: Secondary | ICD-10-CM | POA: Diagnosis not present

## 2015-01-05 DIAGNOSIS — Z951 Presence of aortocoronary bypass graft: Secondary | ICD-10-CM | POA: Diagnosis not present

## 2015-01-05 LAB — COMPREHENSIVE METABOLIC PANEL
ALBUMIN: 3 g/dL — AB (ref 3.5–5.0)
ALK PHOS: 59 U/L (ref 38–126)
ALT: 24 U/L (ref 14–54)
ANION GAP: 8 (ref 5–15)
AST: 27 U/L (ref 15–41)
BUN: 7 mg/dL (ref 6–20)
CALCIUM: 8.6 mg/dL — AB (ref 8.9–10.3)
CHLORIDE: 96 mmol/L — AB (ref 101–111)
CO2: 29 mmol/L (ref 22–32)
CREATININE: 0.59 mg/dL (ref 0.44–1.00)
GFR calc non Af Amer: >60 mL/min (ref >60)
GLUCOSE: 97 mg/dL (ref 65–99)
Potassium: 3.8 mmol/L (ref 3.5–5.1)
SODIUM: 133 mmol/L — AB (ref 135–145)
Total Bilirubin: 0.8 mg/dL (ref 0.3–1.2)
Total Protein: 6.1 g/dL — ABNORMAL LOW (ref 6.5–8.1)

## 2015-01-05 LAB — CBC
HCT: 36.9 % (ref 36.0–46.0)
HEMOGLOBIN: 11.9 g/dL — AB (ref 12.0–15.0)
MCH: 30.4 pg (ref 26.0–34.0)
MCHC: 32.2 g/dL (ref 30.0–36.0)
MCV: 94.1 fL (ref 78.0–100.0)
PLATELETS: 153 10*3/uL (ref 150–400)
RBC: 3.92 MIL/uL (ref 3.87–5.11)
RDW: 14 % (ref 11.5–15.5)
WBC: 11.5 10*3/uL — ABNORMAL HIGH (ref 4.0–10.5)

## 2015-01-05 MED ORDER — TRAMADOL HCL 50 MG PO TABS: 100.0000 mg | ORAL_TABLET | Freq: Four times a day (QID) | ORAL | Status: DC | PRN

## 2015-01-05 NOTE — Anesthesia Postprocedure Evaluation (Signed)
  Anesthesia Post-op Note  Patient: Marissa Bowen  Procedure(s) Performed: Procedure(s): LAPAROSCOPIC CHOLECYSTECTOMY WITH INTRAOPERATIVE CHOLANGIOGRAM (N/A)  Patient Location: PACU  Anesthesia Type:General  Level of Consciousness: awake  Airway and Oxygen Therapy: Patient Spontanous Breathing  Post-op Pain: mild  Post-op Assessment: Post-op Vital signs reviewed              Post-op Vital Signs: Reviewed  Last Vitals:  Filed Vitals:   01/05/15 0541  BP: 168/65  Pulse: 58  Temp: 36.9 C  Resp: 18    Complications: No apparent anesthesia complications

## 2015-01-05 NOTE — Telephone Encounter (Signed)
Patient son, Jeneen Rinks called and stated that patient just was released today from the hospital from having Gallbladder Surgery on Tuesday and they are concerned because patient is still in pain, nausea and won't eat and Hallucinating. Son has been concerned for about a month with patient having confusion. He feels like this is aggravated by the surgery. Son stated that patient is doing better at this time and just wants to schedule a follow up appointment for confusion. Scheduled next available and he stated that if patient has any complications from the surgery he will be calling the surgeon. Agreed.

## 2015-01-05 NOTE — Discharge Summary (Signed)
Physician Discharge Summary  Patient ID: Marissa Bowen MRN: [id number] DOB/AGE: [birth year] 81 y.o.  Admit date: 01/03/2015 Discharge date: 01/05/2015  Admission Diagnoses:  Chronic calculus cholecystitis   Atrial fibrillation   Chronic anticoagulation   Discharge Diagnoses: same Active Problems:   Chronic cholecystitis with calculus   Discharged Condition: good  Hospital Course: Lap chole with IOC on 01/03/15.  Kept her in the hospital due to age, comorbidities, and functional status.  Had significant pain on POD#1 because she cannot tolerate Hydrocodone or Oxycodone.  Pain much improved today.  No nausea.  Diet advanced to regular.  Ambulated in halls.  Ready for discharge today  Consults: None  Significant Diagnostic Studies: labs:  CBC    Component Value Date/Time   WBC 11.5* 01/05/2015 0450   WBC 6.3 10/07/2014 1633   RBC 3.92 01/05/2015 0450   RBC 4.22 10/07/2014 1633   HGB 11.9* 01/05/2015 0450   HCT 36.9 01/05/2015 0450   HCT 38.0 10/07/2014 1633   PLT 153 01/05/2015 0450   MCV 94.1 01/05/2015 0450   MCH 30.4 01/05/2015 0450   MCH 30.3 10/07/2014 1633   MCHC 32.2 01/05/2015 0450   MCHC 33.7 10/07/2014 1633   RDW 14.0 01/05/2015 0450   RDW 13.4 10/07/2014 1633   LYMPHSABS 1.9 10/07/2014 1633   LYMPHSABS 1.4 04/06/2014 1025   MONOABS 0.5 04/06/2014 1025   EOSABS 0.2 04/06/2014 1025   EOSABS 0.2 10/26/2013 1026   BASOSABS 0.0 10/07/2014 1633   BASOSABS 0.0 04/06/2014 1025    Lab Results  Component Value Date   CREATININE 0.59 01/05/2015   BUN 7 01/05/2015   NA 133* 01/05/2015   K 3.8 01/05/2015   CL 96* 01/05/2015   CO2 29 01/05/2015   Lab Results  Component Value Date   ALT 24 01/05/2015   AST 27 01/05/2015   ALKPHOS 59 01/05/2015   BILITOT 0.8 01/05/2015     Treatments: surgery: lap chole with IOC  Discharge Exam: Blood pressure 168/65, pulse 58, temperature 98.4 F (36.9 C), temperature source Oral, resp. rate 18, height 5\' 8"  (1.727 m),  weight 66.565 kg (146 lb 12 oz), SpO2 92 %. General appearance: alert, cooperative and no distress Resp: clear to auscultation bilaterally Cardio: regular rate and rhythm, S1, S2 normal, no murmur, click, rub or gallop GI: soft, mild incisional tenderness Dressings removed.  Steri-strips intact  Disposition: 01-Home or Self Care  Discharge Instructions    Call MD for:  persistant nausea and vomiting    Complete by:  As directed      Call MD for:  redness, tenderness, or signs of infection (pain, swelling, redness, odor or green/yellow discharge around incision site)    Complete by:  As directed      Call MD for:  severe uncontrolled pain    Complete by:  As directed      Call MD for:  temperature >100.4    Complete by:  As directed      Diet general    Complete by:  As directed      Driving Restrictions    Complete by:  As directed   Do not drive while taking pain medications     Increase activity slowly    Complete by:  As directed      May shower / Bathe    Complete by:  As directed      May walk up steps    Complete by:  As directed  Medication List    TAKE these medications        acetaminophen 500 MG tablet  Commonly known as:  TYLENOL  Take 500-1,000 mg by mouth every 6 (six) hours as needed for moderate pain.     amiodarone 200 MG tablet  Commonly known as:  PACERONE  TAKE ONE-HALF TABLET DAILY     bismuth subsalicylate 664 QI/34VQ suspension  Commonly known as:  PEPTO BISMOL  Take 30 mLs by mouth every 6 (six) hours as needed for indigestion.     ELIQUIS 5 MG Tabs tablet  Generic drug:  apixaban  TAKE ONE TABLET TWICE DAILY     estradiol 0.1 MG/GM vaginal cream  Commonly known as:  ESTRACE  Place 1 Applicatorful vaginally 2 (two) times a week.     Fish Oil 1000 MG Caps  Take 1,000 mg by mouth 2 (two) times daily.     Flaxseed Oil 1000 MG Caps  Take 1,000 mg by mouth at bedtime.     fluticasone 50 MCG/ACT nasal spray  Commonly known  as:  FLONASE  Place 2 sprays into both nostrils daily.     LORazepam 1 MG tablet  Commonly known as:  ATIVAN  Take 1 tablet (1 mg total) by mouth every 8 (eight) hours as needed for anxiety.     losartan 50 MG tablet  Commonly known as:  COZAAR  TAKE ONE TABLET TWICE DAILY     metoprolol succinate 25 MG 24 hr tablet  Commonly known as:  TOPROL-XL  TAKE ONE-HALF TABLET DAILY     multivitamin tablet  Take 1 tablet by mouth daily.     niacin 500 MG CR capsule  Take 500 mg by mouth 2 (two) times daily.     pantoprazole 40 MG tablet  Commonly known as:  PROTONIX  TAKE ONE TABLET TWICE DAILY     Ring Pessary Misc  Place vaginally.     traMADol 50 MG tablet  Commonly known as:  ULTRAM  Take 2 tablets (100 mg total) by mouth every 6 (six) hours as needed.     VISINE OP  Place 2 drops into both eyes as needed (for dry eyes).     zolpidem 10 MG tablet  Commonly known as:  AMBIEN  ONE TABLET AT BEDTIME FOR REST           Follow-up Information    Follow up with TSUEI,MATTHEW K., MD. Schedule an appointment as soon as possible for a visit in 3 weeks.   Specialty:  General Surgery   Contact information:   [address] [place] [address] [phone]       Signed: Maia Petties. 01/05/2015, 7:26 AM

## 2015-01-15 ENCOUNTER — Other Ambulatory Visit: Payer: Self-pay | Admitting: General Surgery

## 2015-01-15 ENCOUNTER — Encounter: Payer: Self-pay | Admitting: General Surgery

## 2015-01-15 NOTE — Progress Notes (Unsigned)
She called this AM c/o persistent nausea since her cholecystectomy (01/03/15).  No other complaints.  Zofran called into [facility] on [address].  If this does not help, she was instructed to call the office tomorrow.

## 2015-01-17 ENCOUNTER — Telehealth: Payer: Self-pay | Admitting: Cardiology

## 2015-01-17 NOTE — Telephone Encounter (Signed)
She should avoid taking the combination of amiodarone and Zofran due to risk of QT prolongation.  I think Phenergan carries QT risk also (can check with pharmacist) and likely should be avoided as well.

## 2015-01-17 NOTE — Telephone Encounter (Signed)
Pt states she has had gall bladder taken out this month. Pt states she was given a prescription for ondansetron for nausea over the weekend.  Pt states she is taking amiodarone regularly, she took 7 doses of ondansetron before she was told by pharmacist that she  should not take amiodarone with ondansetron.  Pt states she is not longer taking ondansetron.

## 2015-01-17 NOTE — Telephone Encounter (Signed)
Per Erasm[facility] Down[facility] [place] pharmacist, ondansetron and amiodarone taken together may cause QT prolongation.  Pt advised that she should discontinue ondansetron and  that she should not take ondansetron and amiodarone together.   Pt advised I will forward to Dr Aundra Dubin for review.

## 2015-01-17 NOTE — Telephone Encounter (Signed)
New message     Pt has questions regarding 2 medications; pt states she thinks that they are conflicting (Amiodarone and ondansetron?) Please call pt back after 12:00 today

## 2015-01-17 NOTE — Telephone Encounter (Signed)
Patient is very upset because she had not received a call with Dr. Claris Gladden recommendations.  Informed her that Dr. Aundra Dubin is out of the office today, but she will be called tomorrow at the latest. She st she is "scared to death" and requests a call early in the AM. Informed the patient she will be called as soon as possible.

## 2015-01-17 NOTE — Telephone Encounter (Signed)
Follow Up  Pt called to follow up on previous message. Pt request a call back at your earliest convenience with any updates.

## 2015-01-18 ENCOUNTER — Telehealth: Payer: Self-pay | Admitting: *Deleted

## 2015-01-18 NOTE — Telephone Encounter (Signed)
Notified her that Dr. Aundra Dubin did not recommend taking Zofran or Phenergan with Amiodarone.  States she "has a sick stomach".  Advised to call her PCP to see what she would recommend.  She verbalizes understanding and will call her PCP.

## 2015-01-18 NOTE — Telephone Encounter (Signed)
Received a call from the patient regarding her gall bladder surgery, she stated that she has not been feeling well over the past weekend. She states that she needs something for her stomach and I informed her that it would be better if she called the doctor that done the surgery which is Donnie Mesa, MD. I ask her to get back with the office if she can not get anywhere with that office.

## 2015-01-25 ENCOUNTER — Ambulatory Visit (INDEPENDENT_AMBULATORY_CARE_PROVIDER_SITE_OTHER): Payer: Medicare Other | Admitting: Internal Medicine

## 2015-01-25 ENCOUNTER — Encounter: Payer: Self-pay | Admitting: Internal Medicine

## 2015-01-25 VITALS — BP 130/70 | HR 65 | Temp 98.0°F | Resp 20 | Ht 68.0 in | Wt 141.4 lb

## 2015-01-25 DIAGNOSIS — Z9049 Acquired absence of other specified parts of digestive tract: Secondary | ICD-10-CM

## 2015-01-25 DIAGNOSIS — Z9889 Other specified postprocedural states: Secondary | ICD-10-CM | POA: Diagnosis not present

## 2015-01-25 DIAGNOSIS — R5381 Other malaise: Secondary | ICD-10-CM

## 2015-01-25 DIAGNOSIS — I2510 Atherosclerotic heart disease of native coronary artery without angina pectoris: Secondary | ICD-10-CM | POA: Diagnosis not present

## 2015-01-25 DIAGNOSIS — I480 Paroxysmal atrial fibrillation: Secondary | ICD-10-CM | POA: Diagnosis not present

## 2015-01-25 DIAGNOSIS — F418 Other specified anxiety disorders: Secondary | ICD-10-CM | POA: Diagnosis not present

## 2015-01-25 DIAGNOSIS — I10 Essential (primary) hypertension: Secondary | ICD-10-CM

## 2015-01-25 NOTE — Patient Instructions (Addendum)
Drink at least 3 boosts daily to supplement meals  Recommend clear liquid diet and advance as tolerated  Take zofran as needed for nausea  Follow up with surgery as scheduled  Continue current medications as ordered  Home health will call with PT appt  Follow up as scheduled

## 2015-01-25 NOTE — Progress Notes (Signed)
Patient ID: Marissa Bowen, female   DOB: Jul 17, 1926, 81 y.o.   MRN: [id number]    Location:    [facility]   Place of Service:   OFFICE  Chief Complaint  Patient presents with  . Acute Visit    Confusion and seing things aftr the surgery but better now visit is for after surgery   . Medical Management of Chronic Issues    HPI:  81 yo female seen today for hospital f/u. She is s/p lap chole on 01/03/15 for chronic calculus cholecystitis. She experienced postop hallucinations from pain meds. She had nausea when she was d/c'd home but improved on zofran. She still has abdominal pain but improving every day. (+)f/c but has not taken temperature. She has increased stumbling and feeling weak. She is interested in PT. She does not eat much and does not feel hungry. She is drinking boost 1 daily.  PAF - rate controlled on amiodarone. Takes eliquis  HTN - BP controlled on losartan and metoprolol  Mood stable on current meds  Her son is present today  Past Medical History  Diagnosis Date  . BACK PAIN, LUMBAR 09/27/2009  . CORONARY ARTERY DISEASE     a. s/p remote CABG 1997, 3V.;  b. LexiScan Myoview (8/15): No ischemia, EF 72%, normal study  . DIVERTICULITIS, HX OF 11/25/2006  . FATIGUE 07/21/2008  . GERD 11/25/2006  . HYPERLIPIDEMIA   . HYPERTENSION   . IBS 12/07/2008  . MIGRAINE WITH AURA 08/13/2007  . Muscle weakness (generalized) 09/27/2009  . OSTEOARTHRITIS 08/13/2007  . TRANSIENT ISCHEMIC ATTACK 04/13/2007  . Hiatal hernia   . Paroxysmal atrial fibrillation Dx 02/2012  . Anxiety   . CVA (cerebral infarction)     a. Thalamic infarct 09/2009.  Marland Kitchen Anemia   . GI bleed     a. 09/2009: secondary to antral ulcer.  . Mild carotid artery disease     a. 0-39% bilat 05/2011 (f/u recommended 05/2013).  . Gastric ulcer   . Heart attack   . Acute fibrinous pericarditis   . Dysrhythmia   . Urinary, incontinence, stress female     wears Pessary  . PONV (postoperative nausea and vomiting)     Past  Surgical History  Procedure Laterality Date  . Abdominal hysterectomy  1973  . Coronary artery bypass graft  1997    Dr Melvenia Needles  . Coronary angioplasty  1997    Dr Lia Foyer  . Laparoscopic cholecystectomy  01/03/2015  . Cataract extraction w/ intraocular lens  implant, bilateral Bilateral 1999  . Tonsillectomy    . Cholecystectomy N/A 01/03/2015    Procedure: LAPAROSCOPIC CHOLECYSTECTOMY WITH INTRAOPERATIVE CHOLANGIOGRAM;  Surgeon: Donnie Mesa, MD;  Location: [facility];  Service: General;  Laterality: N/A;    Patient Care Team: Gildardo Cranker, DO as PCP - General (Internal Medicine) Hillary Bow, MD (Cardiology) Melissa Montane, MD as Consulting Physician (Otolaryngology) Arvella Nigh, MD as Consulting Physician (Obstetrics and Gynecology) Rutherford Guys, MD as Consulting Physician (Ophthalmology)  Social History   Social History  . Marital Status: Widowed    Spouse Name: N/A  . Number of Children: N/A  . Years of Education: N/A   Occupational History  . Not on file.   Social History Main Topics  . Smoking status: Never Smoker   . Smokeless tobacco: Never Used  . Alcohol Use: No  . Drug Use: No  . Sexual Activity: No   Other Topics Concern  . Not on file   Social History Narrative  reports that she has never smoked. She has never used smokeless tobacco. She reports that she does not drink alcohol or use illicit drugs.  Allergies  Allergen Reactions  . Asa [Aspirin] Other (See Comments)    Bleeding ulcer  . Hydrocodone Other (See Comments)    REACTION: migraines  . Ibuprofen Other (See Comments)    ulcers  . Oxycodone Hcl Other (See Comments)    REACTION: migraines  . Penicillins Swelling and Rash  . Prednisone Other (See Comments)    ulcers  . Statins Other (See Comments)    Aches and pains  . Pristiq [Desvenlafaxine] Other (See Comments)    Drowsiness and hangover sensation    Medications: Patient's Medications  New Prescriptions   No medications on file    Previous Medications   ACETAMINOPHEN (TYLENOL) 500 MG TABLET    Take 500-1,000 mg by mouth every 6 (six) hours as needed for moderate pain.    AMIODARONE (PACERONE) 200 MG TABLET    TAKE ONE-HALF TABLET DAILY   BISMUTH SUBSALICYLATE (PEPTO BISMOL) 262 MG/15ML SUSPENSION    Take 30 mLs by mouth every 6 (six) hours as needed for indigestion.   ELIQUIS 5 MG TABS TABLET    TAKE ONE TABLET TWICE DAILY   ESTRADIOL (ESTRACE) 0.1 MG/GM VAGINAL CREAM    Place 1 Applicatorful vaginally 2 (two) times a week.   FLUTICASONE (FLONASE) 50 MCG/ACT NASAL SPRAY    Place 2 sprays into both nostrils daily.   LORAZEPAM (ATIVAN) 1 MG TABLET    Take 1 tablet (1 mg total) by mouth every 8 (eight) hours as needed for anxiety.   LOSARTAN (COZAAR) 50 MG TABLET    TAKE ONE TABLET TWICE DAILY   METOPROLOL SUCCINATE (TOPROL-XL) 25 MG 24 HR TABLET    TAKE ONE-HALF TABLET DAILY   MISC. DEVICES (RING PESSARY) MISC    Place vaginally.   MULTIPLE VITAMIN (MULTIVITAMIN) TABLET    Take 1 tablet by mouth daily.     NIACIN 500 MG CR CAPSULE    Take 500 mg by mouth 2 (two) times daily.    PANTOPRAZOLE (PROTONIX) 40 MG TABLET    TAKE ONE TABLET TWICE DAILY   TETRAHYDROZOLINE HCL (VISINE OP)    Place 2 drops into both eyes as needed (for dry eyes).   TRAMADOL (ULTRAM) 50 MG TABLET    Take 2 tablets (100 mg total) by mouth every 6 (six) hours as needed.   ZOLPIDEM (AMBIEN) 10 MG TABLET    ONE TABLET AT BEDTIME FOR REST  Modified Medications   No medications on file  Discontinued Medications   FLAXSEED, LINSEED, (FLAXSEED OIL) 1000 MG CAPS    Take 1,000 mg by mouth at bedtime.    OMEGA-3 FATTY ACIDS (FISH OIL) 1000 MG CAPS    Take 1,000 mg by mouth 2 (two) times daily.     Review of Systems  Constitutional: Positive for fever, chills, activity change and appetite change. Negative for diaphoresis and fatigue.  HENT: Negative for ear pain and sore throat.   Eyes: Negative for visual disturbance.  Respiratory: Negative for cough,  chest tightness and shortness of breath.   Cardiovascular: Negative for chest pain, palpitations and leg swelling.  Gastrointestinal: Positive for nausea and abdominal distention. Negative for vomiting, abdominal pain, diarrhea, constipation and blood in stool.  Genitourinary: Negative for dysuria.  Musculoskeletal: Positive for gait problem. Negative for arthralgias.  Neurological: Positive for weakness. Negative for dizziness, tremors, numbness and headaches.  Psychiatric/Behavioral: Positive for  confusion. Negative for sleep disturbance. The patient is not nervous/anxious.     Filed Vitals:   01/25/15 1617  BP: 130/70  Pulse: 65  Temp: 98 F (36.7 C)  TempSrc: Oral  Resp: 20  Height: 5' 8"  (1.727 m)  Weight: 141 lb 6.4 oz (64.139 kg)  SpO2: 97%   Body mass index is 21.5 kg/(m^2).  Physical Exam  Constitutional: She is oriented to person, place, and time. She appears well-developed and well-nourished. No distress.  Looks tired in NAD  HENT:  Mouth/Throat: Oropharynx is clear and moist. No oropharyngeal exudate.  Eyes: Pupils are equal, round, and reactive to light. No scleral icterus.  Neck: Neck supple. Carotid bruit is not present. No tracheal deviation present. No thyromegaly present.  Cardiovascular: Normal rate, regular rhythm, normal heart sounds and intact distal pulses.  Exam reveals no gallop and no friction rub.   No murmur heard. No LE edema b/l. no calf TTP.   Pulmonary/Chest: Effort normal and breath sounds normal. No stridor. No respiratory distress. She has no wheezes. She has no rales.  Abdominal: Soft. Bowel sounds are normal. She exhibits distension (min). She exhibits no mass. There is no hepatomegaly. There is no tenderness. There is no rebound and no guarding.  x4 lap surg incisions present, all closed. No redness, swelling or TTP.   Musculoskeletal:  Gait unsteady  Lymphadenopathy:    She has no cervical adenopathy.  Neurological: She is alert and  oriented to person, place, and time.  Strength reduced in UE and LE  Skin: Skin is warm and dry. No rash noted.  Psychiatric: She has a normal mood and affect. Her behavior is normal. Thought content normal.     Labs reviewed: Admission on 01/03/2015, Discharged on 01/05/2015  Component Date Value Ref Range Status  . Prothrombin Time 01/03/2015 14.3  11.6 - 15.2 seconds Final  . INR 01/03/2015 1.09  0.00 - 1.49 Final  . aPTT 01/03/2015 30  24 - 37 seconds Final  . WBC 01/03/2015 14.0* 4.0 - 10.5 K/uL Final  . RBC 01/03/2015 4.36  3.87 - 5.11 MIL/uL Final  . Hemoglobin 01/03/2015 13.9  12.0 - 15.0 g/dL Final  . HCT 01/03/2015 40.8  36.0 - 46.0 % Final  . MCV 01/03/2015 93.6  78.0 - 100.0 fL Final  . MCH 01/03/2015 31.9  26.0 - 34.0 pg Final  . MCHC 01/03/2015 34.1  30.0 - 36.0 g/dL Final  . RDW 01/03/2015 13.5  11.5 - 15.5 % Final  . Platelets 01/03/2015 170  150 - 400 K/uL Final  . Creatinine, Ser 01/03/2015 0.66  0.44 - 1.00 mg/dL Final  . GFR calc non Af Amer 01/03/2015 >60  >60 mL/min Final  . GFR calc Af Amer 01/03/2015 >60  >60 mL/min Final   Comment: (NOTE) The eGFR has been calculated using the CKD EPI equation. This calculation has not been validated in all clinical situations. eGFR's persistently <60 mL/min signify possible Chronic Kidney Disease.   . Sodium 01/04/2015 133* 135 - 145 mmol/L Final  . Potassium 01/04/2015 3.6  3.5 - 5.1 mmol/L Final  . Chloride 01/04/2015 96* 101 - 111 mmol/L Final  . CO2 01/04/2015 28  22 - 32 mmol/L Final  . Glucose, Bld 01/04/2015 144* 65 - 99 mg/dL Final  . BUN 01/04/2015 9  6 - 20 mg/dL Final  . Creatinine, Ser 01/04/2015 0.63  0.44 - 1.00 mg/dL Final  . Calcium 01/04/2015 8.5* 8.9 - 10.3 mg/dL Final  . Total  Protein 01/04/2015 5.9* 6.5 - 8.1 g/dL Final  . Albumin 01/04/2015 3.1* 3.5 - 5.0 g/dL Final  . AST 01/04/2015 31  15 - 41 U/L Final  . ALT 01/04/2015 26  14 - 54 U/L Final  . Alkaline Phosphatase 01/04/2015 53  38 - 126  U/L Final  . Total Bilirubin 01/04/2015 1.0  0.3 - 1.2 mg/dL Final  . GFR calc non Af Amer 01/04/2015 >60  >60 mL/min Final  . GFR calc Af Amer 01/04/2015 >60  >60 mL/min Final   Comment: (NOTE) The eGFR has been calculated using the CKD EPI equation. This calculation has not been validated in all clinical situations. eGFR's persistently <60 mL/min signify possible Chronic Kidney Disease.   . Anion gap 01/04/2015 9  5 - 15 Final  . WBC 01/04/2015 19.9* 4.0 - 10.5 K/uL Final  . RBC 01/04/2015 4.00  3.87 - 5.11 MIL/uL Final  . Hemoglobin 01/04/2015 12.1  12.0 - 15.0 g/dL Final  . HCT 01/04/2015 37.4  36.0 - 46.0 % Final  . MCV 01/04/2015 93.5  78.0 - 100.0 fL Final  . MCH 01/04/2015 30.3  26.0 - 34.0 pg Final  . MCHC 01/04/2015 32.4  30.0 - 36.0 g/dL Final  . RDW 01/04/2015 13.7  11.5 - 15.5 % Final  . Platelets 01/04/2015 182  150 - 400 K/uL Final  . WBC 01/05/2015 11.5* 4.0 - 10.5 K/uL Final  . RBC 01/05/2015 3.92  3.87 - 5.11 MIL/uL Final  . Hemoglobin 01/05/2015 11.9* 12.0 - 15.0 g/dL Final  . HCT 01/05/2015 36.9  36.0 - 46.0 % Final  . MCV 01/05/2015 94.1  78.0 - 100.0 fL Final  . MCH 01/05/2015 30.4  26.0 - 34.0 pg Final  . MCHC 01/05/2015 32.2  30.0 - 36.0 g/dL Final  . RDW 01/05/2015 14.0  11.5 - 15.5 % Final  . Platelets 01/05/2015 153  150 - 400 K/uL Final  . Sodium 01/05/2015 133* 135 - 145 mmol/L Final  . Potassium 01/05/2015 3.8  3.5 - 5.1 mmol/L Final  . Chloride 01/05/2015 96* 101 - 111 mmol/L Final  . CO2 01/05/2015 29  22 - 32 mmol/L Final  . Glucose, Bld 01/05/2015 97  65 - 99 mg/dL Final  . BUN 01/05/2015 7  6 - 20 mg/dL Final  . Creatinine, Ser 01/05/2015 0.59  0.44 - 1.00 mg/dL Final  . Calcium 01/05/2015 8.6* 8.9 - 10.3 mg/dL Final  . Total Protein 01/05/2015 6.1* 6.5 - 8.1 g/dL Final  . Albumin 01/05/2015 3.0* 3.5 - 5.0 g/dL Final  . AST 01/05/2015 27  15 - 41 U/L Final  . ALT 01/05/2015 24  14 - 54 U/L Final  . Alkaline Phosphatase 01/05/2015 59  38 -  126 U/L Final  . Total Bilirubin 01/05/2015 0.8  0.3 - 1.2 mg/dL Final  . GFR calc non Af Amer 01/05/2015 >60  >60 mL/min Final  . GFR calc Af Amer 01/05/2015 >60  >60 mL/min Final   Comment: (NOTE) The eGFR has been calculated using the CKD EPI equation. This calculation has not been validated in all clinical situations. eGFR's persistently <60 mL/min signify possible Chronic Kidney Disease.   Georgiann Hahn gap 01/05/2015 8  5 - 15 Final  Hospital Outpatient Visit on 12/26/2014  Component Date Value Ref Range Status  . Sodium 12/26/2014 139  135 - 145 mmol/L Final  . Potassium 12/26/2014 3.7  3.5 - 5.1 mmol/L Final  . Chloride 12/26/2014 102  101 - 111 mmol/L  Final  . CO2 12/26/2014 30  22 - 32 mmol/L Final  . Glucose, Bld 12/26/2014 96  65 - 99 mg/dL Final  . BUN 12/26/2014 8  6 - 20 mg/dL Final  . Creatinine, Ser 12/26/2014 0.65  0.44 - 1.00 mg/dL Final  . Calcium 12/26/2014 9.3  8.9 - 10.3 mg/dL Final  . GFR calc non Af Amer 12/26/2014 >60  >60 mL/min Final  . GFR calc Af Amer 12/26/2014 >60  >60 mL/min Final   Comment: (NOTE) The eGFR has been calculated using the CKD EPI equation. This calculation has not been validated in all clinical situations. eGFR's persistently <60 mL/min signify possible Chronic Kidney Disease.   . Anion gap 12/26/2014 7  5 - 15 Final  . WBC 12/26/2014 7.1  4.0 - 10.5 K/uL Final  . RBC 12/26/2014 4.07  3.87 - 5.11 MIL/uL Final  . Hemoglobin 12/26/2014 12.8  12.0 - 15.0 g/dL Final  . HCT 12/26/2014 38.7  36.0 - 46.0 % Final  . MCV 12/26/2014 95.1  78.0 - 100.0 fL Final  . MCH 12/26/2014 31.4  26.0 - 34.0 pg Final  . MCHC 12/26/2014 33.1  30.0 - 36.0 g/dL Final  . RDW 12/26/2014 13.7  11.5 - 15.5 % Final  . Platelets 12/26/2014 194  150 - 400 K/uL Final  Office Visit on 12/02/2014  Component Date Value Ref Range Status  . TSH 12/02/2014 1.85  0.35 - 4.50 uIU/mL Final  . Total Bilirubin 12/02/2014 0.6  0.2 - 1.2 mg/dL Final  . Bilirubin, Direct  12/02/2014 0.1  0.0 - 0.3 mg/dL Final  . Alkaline Phosphatase 12/02/2014 83  39 - 117 U/L Final  . AST 12/02/2014 24  0 - 37 U/L Final  . ALT 12/02/2014 23  0 - 35 U/L Final  . Total Protein 12/02/2014 6.8  6.0 - 8.3 g/dL Final  . Albumin 12/02/2014 3.8  3.5 - 5.2 g/dL Final  Office Visit on 11/09/2014  Component Date Value Ref Range Status  . Color, UA 11/09/2014 brown   Final  . Clarity, UA 11/09/2014 cloudy   Final  . Glucose, UA 11/09/2014 negative   Final  . Bilirubin, UA 11/09/2014 2+   Final  . Ketones, UA 11/09/2014 trace   Final  . Spec Grav, UA 11/09/2014 1.015   Final  . Blood, UA 11/09/2014 trace   Final  . pH, UA 11/09/2014 5.0   Final  . Protein, UA 11/09/2014 30   Final  . Urobilinogen, UA 11/09/2014 negative   Final  . Nitrite, UA 11/09/2014 negative   Final  . Leukocytes, UA 11/09/2014 moderate (2+)* Negative Final  Admission on 11/05/2014, Discharged on 11/05/2014  Component Date Value Ref Range Status  . WBC 11/05/2014 15.9* 4.0 - 10.5 K/uL Final  . RBC 11/05/2014 4.39  3.87 - 5.11 MIL/uL Final  . Hemoglobin 11/05/2014 13.2  12.0 - 15.0 g/dL Final  . HCT 11/05/2014 41.3  36.0 - 46.0 % Final  . MCV 11/05/2014 94.1  78.0 - 100.0 fL Final  . MCH 11/05/2014 30.1  26.0 - 34.0 pg Final  . MCHC 11/05/2014 32.0  30.0 - 36.0 g/dL Final  . RDW 11/05/2014 13.4  11.5 - 15.5 % Final  . Platelets 11/05/2014 179  150 - 400 K/uL Final  . Sodium 11/05/2014 137  135 - 145 mmol/L Final  . Potassium 11/05/2014 3.4* 3.5 - 5.1 mmol/L Final  . Chloride 11/05/2014 101  101 - 111 mmol/L Final  . CO2  11/05/2014 25  22 - 32 mmol/L Final  . Glucose, Bld 11/05/2014 151* 65 - 99 mg/dL Final  . BUN 11/05/2014 17  6 - 20 mg/dL Final  . Creatinine, Ser 11/05/2014 0.77  0.44 - 1.00 mg/dL Final  . Calcium 11/05/2014 9.1  8.9 - 10.3 mg/dL Final  . GFR calc non Af Amer 11/05/2014 >60  >60 mL/min Final  . GFR calc Af Amer 11/05/2014 >60  >60 mL/min Final   Comment: (NOTE) The eGFR has been  calculated using the CKD EPI equation. This calculation has not been validated in all clinical situations. eGFR's persistently <60 mL/min signify possible Chronic Kidney Disease.   . Anion gap 11/05/2014 11  5 - 15 Final  . Troponin i, poc 11/05/2014 0.01  0.00 - 0.08 ng/mL Final  . Comment 3 11/05/2014          Final   Comment: Due to the release kinetics of cTnI, a negative result within the first hours of the onset of symptoms does not rule out myocardial infarction with certainty. If myocardial infarction is still suspected, repeat the test at appropriate intervals.   . Lipase 11/05/2014 29  22 - 51 U/L Final  . Troponin I 11/05/2014 <0.03  <0.031 ng/mL Final   Comment:        NO INDICATION OF MYOCARDIAL INJURY.     Dg Cholangiogram Operative  01/03/2015   CLINICAL DATA:  Chronic cholecystitis with calculus  EXAM: INTRAOPERATIVE CHOLANGIOGRAM  TECHNIQUE: Cholangiographic images from the C-arm fluoroscopic device were submitted for interpretation post-operatively. Please see the procedural report for the amount of contrast and the fluoroscopy time utilized.  COMPARISON:  Ultrasound 11/11/2014  FINDINGS: No persistent filling defects in the common duct. Intrahepatic ducts are incompletely visualized, appearing decompressed centrally. Contrast passes into the duodenum.  : Negative for retained common duct stone.   Electronically Signed   By: Lucrezia Europe M.D.   On: 01/03/2015 11:10     Assessment/Plan   ICD-9-CM ICD-10-CM   1. Physical deconditioning due to recent surgery 799.3 R53.81 Ambulatory referral to [place]  2. S/P laparoscopic cholecystectomy V45.89 Z98.89 Ambulatory referral to [place] [facility]  3. Depression with anxiety -stable 300.4 F41.8   4.      HTN - BP controlled 5.      PAF - rate controlled  Drink at least 3 boosts daily to supplement meals  Recommend clear liquid diet and advance as tolerated  Take zofran as needed for nausea  Follow up with surgery as  scheduled  Continue current medications as ordered  Home health will call with PT appt  Follow up as scheduled  Monica S. Perlie Gold  [facility] [address] [place], [place] [address] [phone] Cell (Monday-Friday 8 AM - 5 PM) [phone] After 5 PM and follow prompts

## 2015-01-30 ENCOUNTER — Other Ambulatory Visit: Payer: Self-pay | Admitting: Internal Medicine

## 2015-01-30 DIAGNOSIS — I10 Essential (primary) hypertension: Secondary | ICD-10-CM | POA: Diagnosis not present

## 2015-01-30 DIAGNOSIS — M545 Low back pain: Secondary | ICD-10-CM | POA: Diagnosis not present

## 2015-01-30 DIAGNOSIS — I480 Paroxysmal atrial fibrillation: Secondary | ICD-10-CM | POA: Diagnosis not present

## 2015-01-30 DIAGNOSIS — Z7901 Long term (current) use of anticoagulants: Secondary | ICD-10-CM | POA: Diagnosis not present

## 2015-01-30 DIAGNOSIS — R2689 Other abnormalities of gait and mobility: Secondary | ICD-10-CM | POA: Diagnosis not present

## 2015-01-30 DIAGNOSIS — F418 Other specified anxiety disorders: Secondary | ICD-10-CM | POA: Diagnosis not present

## 2015-01-30 DIAGNOSIS — Z9181 History of falling: Secondary | ICD-10-CM | POA: Diagnosis not present

## 2015-01-30 DIAGNOSIS — I252 Old myocardial infarction: Secondary | ICD-10-CM | POA: Diagnosis not present

## 2015-01-30 DIAGNOSIS — I2510 Atherosclerotic heart disease of native coronary artery without angina pectoris: Secondary | ICD-10-CM | POA: Diagnosis not present

## 2015-02-02 DIAGNOSIS — I2510 Atherosclerotic heart disease of native coronary artery without angina pectoris: Secondary | ICD-10-CM | POA: Diagnosis not present

## 2015-02-02 DIAGNOSIS — F418 Other specified anxiety disorders: Secondary | ICD-10-CM | POA: Diagnosis not present

## 2015-02-02 DIAGNOSIS — M545 Low back pain: Secondary | ICD-10-CM | POA: Diagnosis not present

## 2015-02-02 DIAGNOSIS — I10 Essential (primary) hypertension: Secondary | ICD-10-CM | POA: Diagnosis not present

## 2015-02-02 DIAGNOSIS — I480 Paroxysmal atrial fibrillation: Secondary | ICD-10-CM | POA: Diagnosis not present

## 2015-02-02 DIAGNOSIS — R2689 Other abnormalities of gait and mobility: Secondary | ICD-10-CM | POA: Diagnosis not present

## 2015-02-07 DIAGNOSIS — I2510 Atherosclerotic heart disease of native coronary artery without angina pectoris: Secondary | ICD-10-CM | POA: Diagnosis not present

## 2015-02-07 DIAGNOSIS — I480 Paroxysmal atrial fibrillation: Secondary | ICD-10-CM | POA: Diagnosis not present

## 2015-02-07 DIAGNOSIS — R2689 Other abnormalities of gait and mobility: Secondary | ICD-10-CM | POA: Diagnosis not present

## 2015-02-07 DIAGNOSIS — M545 Low back pain: Secondary | ICD-10-CM | POA: Diagnosis not present

## 2015-02-07 DIAGNOSIS — F418 Other specified anxiety disorders: Secondary | ICD-10-CM | POA: Diagnosis not present

## 2015-02-07 DIAGNOSIS — I10 Essential (primary) hypertension: Secondary | ICD-10-CM | POA: Diagnosis not present

## 2015-02-09 ENCOUNTER — Encounter: Payer: Self-pay | Admitting: Surgery

## 2015-02-09 ENCOUNTER — Telehealth: Payer: Self-pay | Admitting: Internal Medicine

## 2015-02-09 DIAGNOSIS — M545 Low back pain: Secondary | ICD-10-CM | POA: Diagnosis not present

## 2015-02-09 DIAGNOSIS — I10 Essential (primary) hypertension: Secondary | ICD-10-CM | POA: Diagnosis not present

## 2015-02-09 DIAGNOSIS — F418 Other specified anxiety disorders: Secondary | ICD-10-CM | POA: Diagnosis not present

## 2015-02-09 DIAGNOSIS — I480 Paroxysmal atrial fibrillation: Secondary | ICD-10-CM | POA: Diagnosis not present

## 2015-02-09 DIAGNOSIS — R2689 Other abnormalities of gait and mobility: Secondary | ICD-10-CM | POA: Diagnosis not present

## 2015-02-09 DIAGNOSIS — I2510 Atherosclerotic heart disease of native coronary artery without angina pectoris: Secondary | ICD-10-CM | POA: Diagnosis not present

## 2015-02-09 NOTE — Telephone Encounter (Signed)
Received referral for patient. Regarding epigastric pain. Ok per Dr. Carlean Purl for patient to wait until December to be seen. Called patient to schedule and she declined.

## 2015-02-14 DIAGNOSIS — F418 Other specified anxiety disorders: Secondary | ICD-10-CM | POA: Diagnosis not present

## 2015-02-14 DIAGNOSIS — I2510 Atherosclerotic heart disease of native coronary artery without angina pectoris: Secondary | ICD-10-CM | POA: Diagnosis not present

## 2015-02-14 DIAGNOSIS — M545 Low back pain: Secondary | ICD-10-CM | POA: Diagnosis not present

## 2015-02-14 DIAGNOSIS — I10 Essential (primary) hypertension: Secondary | ICD-10-CM | POA: Diagnosis not present

## 2015-02-14 DIAGNOSIS — R2689 Other abnormalities of gait and mobility: Secondary | ICD-10-CM | POA: Diagnosis not present

## 2015-02-14 DIAGNOSIS — I480 Paroxysmal atrial fibrillation: Secondary | ICD-10-CM | POA: Diagnosis not present

## 2015-02-16 DIAGNOSIS — I2510 Atherosclerotic heart disease of native coronary artery without angina pectoris: Secondary | ICD-10-CM | POA: Diagnosis not present

## 2015-02-16 DIAGNOSIS — I480 Paroxysmal atrial fibrillation: Secondary | ICD-10-CM | POA: Diagnosis not present

## 2015-02-16 DIAGNOSIS — R2689 Other abnormalities of gait and mobility: Secondary | ICD-10-CM | POA: Diagnosis not present

## 2015-02-16 DIAGNOSIS — F418 Other specified anxiety disorders: Secondary | ICD-10-CM | POA: Diagnosis not present

## 2015-02-16 DIAGNOSIS — I10 Essential (primary) hypertension: Secondary | ICD-10-CM | POA: Diagnosis not present

## 2015-02-16 DIAGNOSIS — M545 Low back pain: Secondary | ICD-10-CM | POA: Diagnosis not present

## 2015-02-17 ENCOUNTER — Ambulatory Visit (INDEPENDENT_AMBULATORY_CARE_PROVIDER_SITE_OTHER): Payer: Medicare Other | Admitting: Internal Medicine

## 2015-02-17 ENCOUNTER — Encounter: Payer: Self-pay | Admitting: Internal Medicine

## 2015-02-17 VITALS — BP 120/76 | HR 60 | Temp 98.4°F | Resp 20 | Ht 68.0 in | Wt 142.8 lb

## 2015-02-17 DIAGNOSIS — F4321 Adjustment disorder with depressed mood: Secondary | ICD-10-CM | POA: Diagnosis not present

## 2015-02-17 DIAGNOSIS — I2510 Atherosclerotic heart disease of native coronary artery without angina pectoris: Secondary | ICD-10-CM

## 2015-02-17 DIAGNOSIS — M150 Primary generalized (osteo)arthritis: Secondary | ICD-10-CM

## 2015-02-17 DIAGNOSIS — K219 Gastro-esophageal reflux disease without esophagitis: Secondary | ICD-10-CM | POA: Diagnosis not present

## 2015-02-17 DIAGNOSIS — R5381 Other malaise: Secondary | ICD-10-CM | POA: Diagnosis not present

## 2015-02-17 DIAGNOSIS — I480 Paroxysmal atrial fibrillation: Secondary | ICD-10-CM

## 2015-02-17 DIAGNOSIS — I10 Essential (primary) hypertension: Secondary | ICD-10-CM | POA: Diagnosis not present

## 2015-02-17 DIAGNOSIS — G4700 Insomnia, unspecified: Secondary | ICD-10-CM

## 2015-02-17 DIAGNOSIS — M159 Polyosteoarthritis, unspecified: Secondary | ICD-10-CM

## 2015-02-17 NOTE — Progress Notes (Signed)
Patient ID: Marissa Bowen, female   DOB: 05/17/1926, 81 y.o.   MRN: [id number]    Location:    [facility]   Place of Service:   OFFICE  Chief Complaint  Patient presents with  . Medical Management of Chronic Issues    having some sleeping issues,    HPI:  81 yo female seen today for f/u. She reports feeling tired "all the time". She has difficulty falling and staying asleep. Taking ambien as Rx but occasionally it does not work. Lorazepam helps reduce anxiety and helps to sleep. She takes it sometime when she cannot fall back to sleep.  She has fallen x 2 since her last OV. She states "my feet got tangled" - once she fell in her driveway and the other time she tripped ascending porch step. She had a scalp laceration and bruised her knee. No LOC. She did not seek medical attention. She is currently taking PT and will complete tx next week  HTN/CAD/hyperlipidemia/PAF - rate controlled on metoprolol and amiodarone. She takes losartan for BP. Taking eliquis for anticoagulation. On niacin for cholesterol  GERD - stable on protonix. Occasional diarrhea. Takes pepto bismul prn  Migraine - stable. No exacerbations since last OV  Hx CVA - no new sx's. Takes eliquis and niacin  She also takes HRT   Past Medical History  Diagnosis Date  . BACK PAIN, LUMBAR 09/27/2009  . CORONARY ARTERY DISEASE     a. s/p remote CABG 1997, 3V.;  b. LexiScan Myoview (8/15): No ischemia, EF 72%, normal study  . DIVERTICULITIS, HX OF 11/25/2006  . FATIGUE 07/21/2008  . GERD 11/25/2006  . HYPERLIPIDEMIA   . HYPERTENSION   . IBS 12/07/2008  . MIGRAINE WITH AURA 08/13/2007  . Muscle weakness (generalized) 09/27/2009  . OSTEOARTHRITIS 08/13/2007  . TRANSIENT ISCHEMIC ATTACK 04/13/2007  . Hiatal hernia   . Paroxysmal atrial fibrillation (HCC) Dx 02/2012  . Anxiety   . CVA (cerebral infarction)     a. Thalamic infarct 09/2009.  Marland Kitchen Anemia   . GI bleed     a. 09/2009: secondary to antral ulcer.  . Mild carotid artery disease  ([place])     a. 0-39% bilat 05/2011 (f/u recommended 05/2013).  . Gastric ulcer   . Heart attack ([place])   . Acute fibrinous pericarditis   . Dysrhythmia   . Urinary, incontinence, stress female     wears Pessary  . PONV (postoperative nausea and vomiting)     Past Surgical History  Procedure Laterality Date  . Abdominal hysterectomy  1973  . Coronary artery bypass graft  1997    Dr Melvenia Needles  . Coronary angioplasty  1997    Dr Lia Foyer  . Laparoscopic cholecystectomy  01/03/2015  . Cataract extraction w/ intraocular lens  implant, bilateral Bilateral 1999  . Tonsillectomy    . Cholecystectomy N/A 01/03/2015    Procedure: LAPAROSCOPIC CHOLECYSTECTOMY WITH INTRAOPERATIVE CHOLANGIOGRAM;  Surgeon: Donnie Mesa, MD;  Location: [facility];  Service: General;  Laterality: N/A;    Patient Care Team: Gildardo Cranker, DO as PCP - General (Internal Medicine) Hillary Bow, MD (Cardiology) Melissa Montane, MD as Consulting Physician (Otolaryngology) Arvella Nigh, MD as Consulting Physician (Obstetrics and Gynecology) Rutherford Guys, MD as Consulting Physician (Ophthalmology)  Social History   Social History  . Marital Status: Widowed    Spouse Name: N/A  . Number of Children: N/A  . Years of Education: N/A   Occupational History  . Not on file.   Social  History Main Topics  . Smoking status: Never Smoker   . Smokeless tobacco: Never Used  . Alcohol Use: No  . Drug Use: No  . Sexual Activity: No   Other Topics Concern  . Not on file   Social History Narrative     reports that she has never smoked. She has never used smokeless tobacco. She reports that she does not drink alcohol or use illicit drugs.  Allergies  Allergen Reactions  . Asa [Aspirin] Other (See Comments)    Bleeding ulcer  . Hydrocodone Other (See Comments)    REACTION: migraines  . Ibuprofen Other (See Comments)    ulcers  . Oxycodone Hcl Other (See Comments)    REACTION: migraines  . Penicillins Swelling and Rash  .  Prednisone Other (See Comments)    ulcers  . Statins Other (See Comments)    Aches and pains  . Pristiq [Desvenlafaxine] Other (See Comments)    Drowsiness and hangover sensation    Medications: Patient's Medications  New Prescriptions   No medications on file  Previous Medications   ACETAMINOPHEN (TYLENOL) 500 MG TABLET    Take 500-1,000 mg by mouth every 6 (six) hours as needed for moderate pain.    AMIODARONE (PACERONE) 200 MG TABLET    TAKE ONE-HALF TABLET DAILY   BISMUTH SUBSALICYLATE (PEPTO BISMOL) 262 MG/15ML SUSPENSION    Take 30 mLs by mouth every 6 (six) hours as needed for indigestion.   ELIQUIS 5 MG TABS TABLET    TAKE ONE TABLET TWICE DAILY   ESTRADIOL (ESTRACE) 0.1 MG/GM VAGINAL CREAM    Place 1 Applicatorful vaginally 2 (two) times a week.   FLUTICASONE (FLONASE) 50 MCG/ACT NASAL SPRAY    Place 2 sprays into both nostrils daily.   LORAZEPAM (ATIVAN) 1 MG TABLET    Take 1 tablet (1 mg total) by mouth every 8 (eight) hours as needed for anxiety.   LOSARTAN (COZAAR) 50 MG TABLET    TAKE ONE TABLET TWICE DAILY   METOPROLOL SUCCINATE (TOPROL-XL) 25 MG 24 HR TABLET    TAKE ONE-HALF TABLET DAILY   MISC. DEVICES (RING PESSARY) MISC    Place vaginally.   MULTIPLE VITAMIN (MULTIVITAMIN) TABLET    Take 1 tablet by mouth daily.     NIACIN 500 MG CR CAPSULE    Take 500 mg by mouth 2 (two) times daily.    PANTOPRAZOLE (PROTONIX) 40 MG TABLET    TAKE ONE TABLET TWICE DAILY   TETRAHYDROZOLINE HCL (VISINE OP)    Place 2 drops into both eyes as needed (for dry eyes).   ZOLPIDEM (AMBIEN) 10 MG TABLET    ONE TABLET AT BEDTIME FOR REST  Modified Medications   No medications on file  Discontinued Medications   TRAMADOL (ULTRAM) 50 MG TABLET    Take 2 tablets (100 mg total) by mouth every 6 (six) hours as needed.    Review of Systems  Constitutional: Negative for fever, chills, diaphoresis, activity change, appetite change and fatigue.  HENT: Negative for ear pain and sore throat.     Eyes: Negative for visual disturbance.  Respiratory: Negative for cough, chest tightness and shortness of breath.   Cardiovascular: Negative for chest pain, palpitations and leg swelling.  Gastrointestinal: Positive for diarrhea. Negative for nausea, vomiting, abdominal pain, constipation and blood in stool.  Genitourinary: Negative for dysuria.  Musculoskeletal: Positive for joint swelling, arthralgias and gait problem.  Neurological: Negative for dizziness, tremors, numbness and headaches.  Psychiatric/Behavioral: Positive for sleep  disturbance and dysphoric mood. The patient is nervous/anxious.     Filed Vitals:   02/17/15 1421  BP: 120/76  Pulse: 60  Temp: 98.4 F (36.9 C)  TempSrc: Oral  Resp: 20  Height: _0  (1.727 m)  Weight: 142 lb 12.8 oz (64.774 kg)  SpO2: 97%   Body mass index is 21.72 kg/(m^2).  Physical Exam  Constitutional: She is oriented to person, place, and time. She appears well-developed and well-nourished.  HENT:  Mouth/Throat: Oropharynx is clear and moist. No oropharyngeal exudate.  Eyes: Pupils are equal, round, and reactive to light. No scleral icterus.  Neck: Neck supple. Carotid bruit is not present. No tracheal deviation present. No thyromegaly present.  Cardiovascular: Normal rate, regular rhythm, normal heart sounds and intact distal pulses.  Exam reveals no gallop and no friction rub.   No murmur heard. No LE edema b/l. no calf TTP.   Pulmonary/Chest: Effort normal and breath sounds normal. No stridor. No respiratory distress. She has no wheezes. She has no rales.  Abdominal: Soft. Bowel sounds are normal. She exhibits no distension and no mass. There is no hepatomegaly. There is no tenderness. There is no rebound and no guarding.  Musculoskeletal: She exhibits edema (right knee swelling with intact ROM) and tenderness.  Right knee anterior/posterior drawer test negative; neg mcmurray test  Lymphadenopathy:    She has no cervical adenopathy.   Neurological: She is alert and oriented to person, place, and time.  Skin: Skin is warm and dry. No rash noted.  Psychiatric: She has a normal mood and affect. Her behavior is normal. Thought content normal.     Labs reviewed: Admission on 01/03/2015, Discharged on 01/05/2015  Component Date Value Ref Range Status  . Prothrombin Time 01/03/2015 14.3  11.6 - 15.2 seconds Final  . INR 01/03/2015 1.09  0.00 - 1.49 Final  . aPTT 01/03/2015 30  24 - 37 seconds Final  . WBC 01/03/2015 14.0* 4.0 - 10.5 K/uL Final  . RBC 01/03/2015 4.36  3.87 - 5.11 MIL/uL Final  . Hemoglobin 01/03/2015 13.9  12.0 - 15.0 g/dL Final  . HCT 01/03/2015 40.8  36.0 - 46.0 % Final  . MCV 01/03/2015 93.6  78.0 - 100.0 fL Final  . MCH 01/03/2015 31.9  26.0 - 34.0 pg Final  . MCHC 01/03/2015 34.1  30.0 - 36.0 g/dL Final  . RDW 01/03/2015 13.5  11.5 - 15.5 % Final  . Platelets 01/03/2015 170  150 - 400 K/uL Final  . Creatinine, Ser 01/03/2015 0.66  0.44 - 1.00 mg/dL Final  . GFR calc non Af Amer 01/03/2015 >60  >60 mL/min Final  . GFR calc Af Amer 01/03/2015 >60  >60 mL/min Final   Comment: (NOTE) The eGFR has been calculated using the CKD EPI equation. This calculation has not been validated in all clinical situations. eGFR's persistently <60 mL/min signify possible Chronic Kidney Disease.   . Sodium 01/04/2015 133* 135 - 145 mmol/L Final  . Potassium 01/04/2015 3.6  3.5 - 5.1 mmol/L Final  . Chloride 01/04/2015 96* 101 - 111 mmol/L Final  . CO2 01/04/2015 28  22 - 32 mmol/L Final  . Glucose, Bld 01/04/2015 144* 65 - 99 mg/dL Final  . BUN 01/04/2015 9  6 - 20 mg/dL Final  . Creatinine, Ser 01/04/2015 0.63  0.44 - 1.00 mg/dL Final  . Calcium 01/04/2015 8.5* 8.9 - 10.3 mg/dL Final  . Total Protein 01/04/2015 5.9* 6.5 - 8.1 g/dL Final  . Albumin 01/04/2015 3.1*  3.5 - 5.0 g/dL Final  . AST 01/04/2015 31  15 - 41 U/L Final  . ALT 01/04/2015 26  14 - 54 U/L Final  . Alkaline Phosphatase 01/04/2015 53  38 - 126  U/L Final  . Total Bilirubin 01/04/2015 1.0  0.3 - 1.2 mg/dL Final  . GFR calc non Af Amer 01/04/2015 >60  >60 mL/min Final  . GFR calc Af Amer 01/04/2015 >60  >60 mL/min Final   Comment: (NOTE) The eGFR has been calculated using the CKD EPI equation. This calculation has not been validated in all clinical situations. eGFR's persistently <60 mL/min signify possible Chronic Kidney Disease.   . Anion gap 01/04/2015 9  5 - 15 Final  . WBC 01/04/2015 19.9* 4.0 - 10.5 K/uL Final  . RBC 01/04/2015 4.00  3.87 - 5.11 MIL/uL Final  . Hemoglobin 01/04/2015 12.1  12.0 - 15.0 g/dL Final  . HCT 01/04/2015 37.4  36.0 - 46.0 % Final  . MCV 01/04/2015 93.5  78.0 - 100.0 fL Final  . MCH 01/04/2015 30.3  26.0 - 34.0 pg Final  . MCHC 01/04/2015 32.4  30.0 - 36.0 g/dL Final  . RDW 01/04/2015 13.7  11.5 - 15.5 % Final  . Platelets 01/04/2015 182  150 - 400 K/uL Final  . WBC 01/05/2015 11.5* 4.0 - 10.5 K/uL Final  . RBC 01/05/2015 3.92  3.87 - 5.11 MIL/uL Final  . Hemoglobin 01/05/2015 11.9* 12.0 - 15.0 g/dL Final  . HCT 01/05/2015 36.9  36.0 - 46.0 % Final  . MCV 01/05/2015 94.1  78.0 - 100.0 fL Final  . MCH 01/05/2015 30.4  26.0 - 34.0 pg Final  . MCHC 01/05/2015 32.2  30.0 - 36.0 g/dL Final  . RDW 01/05/2015 14.0  11.5 - 15.5 % Final  . Platelets 01/05/2015 153  150 - 400 K/uL Final  . Sodium 01/05/2015 133* 135 - 145 mmol/L Final  . Potassium 01/05/2015 3.8  3.5 - 5.1 mmol/L Final  . Chloride 01/05/2015 96* 101 - 111 mmol/L Final  . CO2 01/05/2015 29  22 - 32 mmol/L Final  . Glucose, Bld 01/05/2015 97  65 - 99 mg/dL Final  . BUN 01/05/2015 7  6 - 20 mg/dL Final  . Creatinine, Ser 01/05/2015 0.59  0.44 - 1.00 mg/dL Final  . Calcium 01/05/2015 8.6* 8.9 - 10.3 mg/dL Final  . Total Protein 01/05/2015 6.1* 6.5 - 8.1 g/dL Final  . Albumin 01/05/2015 3.0* 3.5 - 5.0 g/dL Final  . AST 01/05/2015 27  15 - 41 U/L Final  . ALT 01/05/2015 24  14 - 54 U/L Final  . Alkaline Phosphatase 01/05/2015 59  38 -  126 U/L Final  . Total Bilirubin 01/05/2015 0.8  0.3 - 1.2 mg/dL Final  . GFR calc non Af Amer 01/05/2015 >60  >60 mL/min Final  . GFR calc Af Amer 01/05/2015 >60  >60 mL/min Final   Comment: (NOTE) The eGFR has been calculated using the CKD EPI equation. This calculation has not been validated in all clinical situations. eGFR's persistently <60 mL/min signify possible Chronic Kidney Disease.   Georgiann Hahn gap 01/05/2015 8  5 - 15 Final  Hospital Outpatient Visit on 12/26/2014  Component Date Value Ref Range Status  . Sodium 12/26/2014 139  135 - 145 mmol/L Final  . Potassium 12/26/2014 3.7  3.5 - 5.1 mmol/L Final  . Chloride 12/26/2014 102  101 - 111 mmol/L Final  . CO2 12/26/2014 30  22 - 32 mmol/L Final  .  Glucose, Bld 12/26/2014 96  65 - 99 mg/dL Final  . BUN 12/26/2014 8  6 - 20 mg/dL Final  . Creatinine, Ser 12/26/2014 0.65  0.44 - 1.00 mg/dL Final  . Calcium 12/26/2014 9.3  8.9 - 10.3 mg/dL Final  . GFR calc non Af Amer 12/26/2014 >60  >60 mL/min Final  . GFR calc Af Amer 12/26/2014 >60  >60 mL/min Final   Comment: (NOTE) The eGFR has been calculated using the CKD EPI equation. This calculation has not been validated in all clinical situations. eGFR's persistently <60 mL/min signify possible Chronic Kidney Disease.   . Anion gap 12/26/2014 7  5 - 15 Final  . WBC 12/26/2014 7.1  4.0 - 10.5 K/uL Final  . RBC 12/26/2014 4.07  3.87 - 5.11 MIL/uL Final  . Hemoglobin 12/26/2014 12.8  12.0 - 15.0 g/dL Final  . HCT 12/26/2014 38.7  36.0 - 46.0 % Final  . MCV 12/26/2014 95.1  78.0 - 100.0 fL Final  . MCH 12/26/2014 31.4  26.0 - 34.0 pg Final  . MCHC 12/26/2014 33.1  30.0 - 36.0 g/dL Final  . RDW 12/26/2014 13.7  11.5 - 15.5 % Final  . Platelets 12/26/2014 194  150 - 400 K/uL Final  Office Visit on 12/02/2014  Component Date Value Ref Range Status  . TSH 12/02/2014 1.85  0.35 - 4.50 uIU/mL Final  . Total Bilirubin 12/02/2014 0.6  0.2 - 1.2 mg/dL Final  . Bilirubin, Direct  12/02/2014 0.1  0.0 - 0.3 mg/dL Final  . Alkaline Phosphatase 12/02/2014 83  39 - 117 U/L Final  . AST 12/02/2014 24  0 - 37 U/L Final  . ALT 12/02/2014 23  0 - 35 U/L Final  . Total Protein 12/02/2014 6.8  6.0 - 8.3 g/dL Final  . Albumin 12/02/2014 3.8  3.5 - 5.2 g/dL Final    No results found.   Assessment/Plan   ICD-9-CM ICD-10-CM   1. Insomnia - failing to change as expected 780.52 G47.00   2. Grief reaction - unchanged 309.0 F43.21   3. Essential hypertension - stable 401.9 I10   4. Physical deconditioning - improving 799.3 R53.81   5. Paroxysmal atrial fibrillation (HCC) - rate controlled 427.31 I48.0   6. Gastroesophageal reflux disease without esophagitis - stable 530.81 K21.9   7. Primary osteoarthritis involving multiple joints - stable 715.09 M15.0    Reduce fatty foods in diet to reduce episodes of diarrhea  Trial of belsomra 74m (samples) at bedtime to help sleep. Hold ambien while taking belsomra  Continue other medications as ordered  Follow up in 2 mos for routine visit.  Marissa Bowen PS[facility] [address] [phone] (Monday-Friday 8 AM - 5 PM) ([phone] 5 PM and follow prompts

## 2015-02-17 NOTE — Patient Instructions (Addendum)
Reduce fatty foods in diet to reduce episodes of diarrhea  Trial of belsomra 10mg  (samples) at bedtime to help sleep. Hold ambien while taking belsomra  Continue other medications as ordered  Follow up in 2 mos for routine visit.

## 2015-02-22 ENCOUNTER — Telehealth: Payer: Self-pay

## 2015-02-22 NOTE — Telephone Encounter (Signed)
Ok to stop belsomra and resume [place]. Please provide new Rx if she requires one.

## 2015-02-22 NOTE — Telephone Encounter (Signed)
Called patient, no answer. I will try to reach patient again later (no voicemail available)

## 2015-02-22 NOTE — Telephone Encounter (Signed)
Message left on triage voicemail- new sleep medication is ineffective, patient would like to know if Dr.Carter will switch her back to [place].  I called patient, patient aware Dr.Carter is out of office and would like for this message to wait for her. Patient was given a trial of belsomra and it was ineffective. Patient would rather switch back to [place]. Please advise

## 2015-02-23 DIAGNOSIS — I10 Essential (primary) hypertension: Secondary | ICD-10-CM | POA: Diagnosis not present

## 2015-02-23 DIAGNOSIS — F418 Other specified anxiety disorders: Secondary | ICD-10-CM | POA: Diagnosis not present

## 2015-02-23 DIAGNOSIS — I2510 Atherosclerotic heart disease of native coronary artery without angina pectoris: Secondary | ICD-10-CM | POA: Diagnosis not present

## 2015-02-23 DIAGNOSIS — N8189 Other female genital prolapse: Secondary | ICD-10-CM | POA: Diagnosis not present

## 2015-02-23 DIAGNOSIS — R2689 Other abnormalities of gait and mobility: Secondary | ICD-10-CM | POA: Diagnosis not present

## 2015-02-23 DIAGNOSIS — I480 Paroxysmal atrial fibrillation: Secondary | ICD-10-CM | POA: Diagnosis not present

## 2015-02-23 DIAGNOSIS — M545 Low back pain: Secondary | ICD-10-CM | POA: Diagnosis not present

## 2015-02-24 NOTE — Telephone Encounter (Signed)
Called patient, no answer, no voicemail. I will try to reach patient later

## 2015-03-07 NOTE — Telephone Encounter (Signed)
Spoke with patient, patient aware of instructions. Patient will switch back to Ambien and she has plenty on hand no RX needed right now

## 2015-03-09 ENCOUNTER — Encounter: Payer: Self-pay | Admitting: Nurse Practitioner

## 2015-03-09 ENCOUNTER — Ambulatory Visit (INDEPENDENT_AMBULATORY_CARE_PROVIDER_SITE_OTHER): Payer: Medicare Other | Admitting: Nurse Practitioner

## 2015-03-09 VITALS — BP 132/72 | HR 65 | Temp 98.2°F | Resp 20 | Ht 68.0 in | Wt 142.8 lb

## 2015-03-09 DIAGNOSIS — I2510 Atherosclerotic heart disease of native coronary artery without angina pectoris: Secondary | ICD-10-CM

## 2015-03-09 DIAGNOSIS — R42 Dizziness and giddiness: Secondary | ICD-10-CM

## 2015-03-09 NOTE — Progress Notes (Signed)
Patient ID: Marissa Bowen, female   DOB: 03/21/1927, 81 y.o.   MRN: [id number]    PCP: Gildardo Cranker, DO  Advanced Directive information Does patient have an advance directive?: Yes, Type of Advance Directive: Living will, Does patient want to make changes to advanced directive?: No - Patient declined  Allergies  Allergen Reactions  . Asa [Aspirin] Other (See Comments)    Bleeding ulcer  . Hydrocodone Other (See Comments)    REACTION: migraines  . Ibuprofen Other (See Comments)    ulcers  . Oxycodone Hcl Other (See Comments)    REACTION: migraines  . Penicillins Swelling and Rash  . Prednisone Other (See Comments)    ulcers  . Statins Other (See Comments)    Aches and pains  . Pristiq [Desvenlafaxine] Other (See Comments)    Drowsiness and hangover sensation    Chief Complaint  Patient presents with  . Acute Visit    dizziness started last night, feels very off balance and wobbly     HPI: Patient is a 81 y.o. female seen in the office today due to feeling very dizzy. Has been off balanced for a while. Woke up to go to the bathroom and felt the room spinning. Reoccurred when she woke up this morning and had 3 episodes total, lasting only a few seconds. Fells like she is seeing things move on the wall and the whole room is spinning. Happens when she moves her head a certain way. Had to catch her self to keep from falling.  No loss of hearing, has had a hx of tinnitus but this has not changed.  Hx of tremor, but has had this and there is no change.  Will have an occasional headache, had 2 a few weeks ago, non in the last 24 hours. No nausea or vomiting.    Review of Systems:  Review of Systems  Constitutional: Negative for activity change, appetite change, fatigue and unexpected weight change.  HENT: Positive for tinnitus (chronic). Negative for congestion, hearing loss, mouth sores, sinus pressure and trouble swallowing.   Eyes: Negative for visual disturbance.    Respiratory: Negative for shortness of breath and wheezing.   Cardiovascular: Negative for chest pain.  Genitourinary: Negative for dysuria and difficulty urinating.  Musculoskeletal: Negative for joint swelling and arthralgias.  Allergic/Immunologic: Positive for environmental allergies.  Neurological: Positive for dizziness and tremors (chronic). Negative for seizures, speech difficulty, weakness, light-headedness, numbness and headaches.    Past Medical History  Diagnosis Date  . BACK PAIN, LUMBAR 09/27/2009  . CORONARY ARTERY DISEASE     a. s/p remote CABG 1997, 3V.;  b. LexiScan Myoview (8/15): No ischemia, EF 72%, normal study  . DIVERTICULITIS, HX OF 11/25/2006  . FATIGUE 07/21/2008  . GERD 11/25/2006  . HYPERLIPIDEMIA   . HYPERTENSION   . IBS 12/07/2008  . MIGRAINE WITH AURA 08/13/2007  . Muscle weakness (generalized) 09/27/2009  . OSTEOARTHRITIS 08/13/2007  . TRANSIENT ISCHEMIC ATTACK 04/13/2007  . Hiatal hernia   . Paroxysmal atrial fibrillation (HCC) Dx 02/2012  . Anxiety   . CVA (cerebral infarction)     a. Thalamic infarct 09/2009.  Marland Kitchen Anemia   . GI bleed     a. 09/2009: secondary to antral ulcer.  . Mild carotid artery disease ([place])     a. 0-39% bilat 05/2011 (f/u recommended 05/2013).  . Gastric ulcer   . Heart attack ([place])   . Acute fibrinous pericarditis   . Dysrhythmia   . Urinary, incontinence, stress  female     wears Pessary  . PONV (postoperative nausea and vomiting)    Past Surgical History  Procedure Laterality Date  . Abdominal hysterectomy  1973  . Coronary artery bypass graft  1997    Dr Melvenia Needles  . Coronary angioplasty  1997    Dr Lia Foyer  . Laparoscopic cholecystectomy  01/03/2015  . Cataract extraction w/ intraocular lens  implant, bilateral Bilateral 1999  . Tonsillectomy    . Cholecystectomy N/A 01/03/2015    Procedure: LAPAROSCOPIC CHOLECYSTECTOMY WITH INTRAOPERATIVE CHOLANGIOGRAM;  Surgeon: Donnie Mesa, MD;  Location: [facility];  Service: General;   Laterality: N/A;   Social History:   reports that she has never smoked. She has never used smokeless tobacco. She reports that she does not drink alcohol or use illicit drugs.  Family History  Problem Relation Age of Onset  . Heart attack Mother   . Heart failure Father   . Throat cancer Sister   . Heart disease Sister   . Coronary artery disease Sister   . Colon cancer Neg Hx   . Diabetes Sister     Medications: Patient's Medications  New Prescriptions   No medications on file  Previous Medications   ACETAMINOPHEN (TYLENOL) 500 MG TABLET    Take 500-1,000 mg by mouth every 6 (six) hours as needed for moderate pain.    AMIODARONE (PACERONE) 200 MG TABLET    TAKE ONE-HALF TABLET DAILY   BISMUTH SUBSALICYLATE (PEPTO BISMOL) 262 MG/15ML SUSPENSION    Take 30 mLs by mouth every 6 (six) hours as needed for indigestion.   ELIQUIS 5 MG TABS TABLET    TAKE ONE TABLET TWICE DAILY   ESTRADIOL (ESTRACE) 0.1 MG/GM VAGINAL CREAM    Place 1 Applicatorful vaginally 2 (two) times a week.   FLUTICASONE (FLONASE) 50 MCG/ACT NASAL SPRAY    Place 2 sprays into both nostrils daily.   LORAZEPAM (ATIVAN) 1 MG TABLET    Take 1 tablet (1 mg total) by mouth every 8 (eight) hours as needed for anxiety.   LOSARTAN (COZAAR) 50 MG TABLET    TAKE ONE TABLET TWICE DAILY   METOPROLOL SUCCINATE (TOPROL-XL) 25 MG 24 HR TABLET    TAKE ONE-HALF TABLET DAILY   MISC. DEVICES (RING PESSARY) MISC    Place vaginally.   MULTIPLE VITAMIN (MULTIVITAMIN) TABLET    Take 1 tablet by mouth daily.     NIACIN 500 MG CR CAPSULE    Take 500 mg by mouth 2 (two) times daily.    PANTOPRAZOLE (PROTONIX) 40 MG TABLET    TAKE ONE TABLET TWICE DAILY   TETRAHYDROZOLINE HCL (VISINE OP)    Place 2 drops into both eyes as needed (for dry eyes).   ZOLPIDEM (AMBIEN) 10 MG TABLET    ONE TABLET AT BEDTIME FOR REST  Modified Medications   No medications on file  Discontinued Medications   No medications on file     Physical Exam:  Filed  Vitals:   03/09/15 1609  BP: 132/72  Pulse: 65  Temp: 98.2 F (36.8 C)  TempSrc: Oral  Resp: 20  Height: 5\' 8"  (1.727 m)  Weight: 142 lb 12.8 oz (64.774 kg)  SpO2: 98%   Body mass index is 21.72 kg/(m^2).  Physical Exam  Constitutional: She is oriented to person, place, and time. She appears well-developed and well-nourished. No distress.  HENT:  Head: Normocephalic and atraumatic.  Right Ear: Tympanic membrane, external ear and ear canal normal.  Left Ear: Tympanic membrane,  external ear and ear canal normal.  Nose: Nose normal.  Mouth/Throat: Oropharynx is clear and moist. No oropharyngeal exudate.  Eyes: Conjunctivae are normal. Pupils are equal, round, and reactive to light.  Neck: Normal range of motion. Neck supple. No JVD present. No thyromegaly present.  Cardiovascular: Normal rate, regular rhythm and normal heart sounds.   Pulmonary/Chest: Effort normal and breath sounds normal.  Abdominal: Soft. Bowel sounds are normal.  Musculoskeletal: She exhibits no edema or tenderness.  Neurological: She is alert and oriented to person, place, and time.  Skin: Skin is warm and dry. She is not diaphoretic.  Psychiatric: She has a normal mood and affect.    Labs reviewed: Basic Metabolic Panel:  Recent Labs  04/06/14 1025  12/02/14 1116 12/26/14 1119 01/03/15 1450 01/04/15 0400 01/05/15 0450  NA 137  < >  --  139  --  133* 133*  K 3.7  < >  --  3.7  --  3.6 3.8  CL 99  < >  --  102  --  96* 96*  CO2 30  < >  --  30  --  28 29  GLUCOSE 104*  < >  --  96  --  144* 97  BUN 14  < >  --  8  --  9 7  CREATININE 0.8  < >  --  0.65 0.66 0.63 0.59  CALCIUM 8.8  < >  --  9.3  --  8.5* 8.6*  TSH 1.82  --  1.85  --   --   --   --   < > = values in this interval not displayed. Liver Function Tests:  Recent Labs  12/02/14 1116 01/04/15 0400 01/05/15 0450  AST 24 31 27   ALT 23 26 24   ALKPHOS 83 53 59  BILITOT 0.6 1.0 0.8  PROT 6.8 5.9* 6.1*  ALBUMIN 3.8 3.1* 3.0*     Recent Labs  11/05/14 1655  LIPASE 29   No results for input(s): AMMONIA in the last 8760 hours. CBC:  Recent Labs  04/06/14 1025 10/07/14 1633  01/03/15 1450 01/04/15 0400 01/05/15 0450  WBC 7.3 6.3  < > 14.0* 19.9* 11.5*  NEUTROABS 5.2 3.4  --   --   --   --   HGB 12.3  --   < > 13.9 12.1 11.9*  HCT 36.8 38.0  < > 40.8 37.4 36.9  MCV 93.0  --   < > 93.6 93.5 94.1  PLT 240.0  --   < > 170 182 153  < > = values in this interval not displayed. Lipid Panel:  Recent Labs  08/05/14 0914  CHOL 210*  HDL 72  LDLCALC 119*  TRIG 94  CHOLHDL 2.9   TSH:  Recent Labs  04/06/14 1025 12/02/14 1116  TSH 1.82 1.85   A1C: Lab Results  Component Value Date   HGBA1C * 09/29/2009    5.8 (NOTE)                                                                       According to the ADA Clinical Practice Recommendations for 2011, when HbA1c is used as a screening test:   >=6.5%   Diagnostic  of Diabetes Mellitus           (if abnormal result  is confirmed)  5.7-6.4%   Increased risk of developing Diabetes Mellitus  References:Diagnosis and Classification of Diabetes Mellitus,Diabetes [id number] 1):S62-S69 and Standards of Medical Care in         Diabetes - 2011,Diabetes [id number]  (Suppl 1):S11-S61.     Assessment/Plan 1. Dizziness -appears to be BPPV. Exam unremarkable, Will follow up bmp, cbc,  Recent TSH WNL. -will monitor for now, if worsening of symptoms to follow up. If persist will need further evaluation and may need PT for the Epley maneuver - Basic metabolic panel - CBC with Differential  -keep follow up with Dr Eulas Post in Dec, sooner if needed  Kyah Buesing K. Harle Battiest  [facility] [phone] 8 am - 5 pm) [phone] (after hours)

## 2015-03-09 NOTE — Patient Instructions (Signed)

## 2015-03-10 ENCOUNTER — Telehealth: Payer: Self-pay | Admitting: *Deleted

## 2015-03-10 LAB — BASIC METABOLIC PANEL
BUN/Creatinine Ratio: 19 (ref 11–26)
BUN: 11 mg/dL (ref 8–27)
CALCIUM: 9.2 mg/dL (ref 8.7–10.3)
CO2: 28 mmol/L (ref 18–29)
Chloride: 98 mmol/L (ref 97–106)
Creatinine, Ser: 0.57 mg/dL (ref 0.57–1.00)
GFR, EST AFRICAN AMERICAN: 97 mL/min/{1.73_m2} (ref >59)
GFR, EST NON AFRICAN AMERICAN: 84 mL/min/{1.73_m2} (ref >59)
Glucose: 85 mg/dL (ref 65–99)
POTASSIUM: 4.1 mmol/L (ref 3.5–5.2)
SODIUM: 140 mmol/L (ref 136–144)

## 2015-03-10 LAB — CBC WITH DIFFERENTIAL/PLATELET
BASOS: 0 %
Basophils Absolute: 0 10*3/uL (ref 0.0–0.2)
EOS (ABSOLUTE): 0.2 10*3/uL (ref 0.0–0.4)
EOS: 2 %
HEMATOCRIT: 38.4 % (ref 34.0–46.6)
HEMOGLOBIN: 12.9 g/dL (ref 11.1–15.9)
IMMATURE GRANS (ABS): 0 10*3/uL (ref 0.0–0.1)
Immature Granulocytes: 0 %
LYMPHS: 24 %
Lymphocytes Absolute: 1.6 10*3/uL (ref 0.7–3.1)
MCH: 30.3 pg (ref 26.6–33.0)
MCHC: 33.6 g/dL (ref 31.5–35.7)
MCV: 90 fL (ref 79–97)
Monocytes Absolute: 0.6 10*3/uL (ref 0.1–0.9)
Monocytes: 9 %
NEUTROS ABS: 4.5 10*3/uL (ref 1.4–7.0)
Neutrophils: 65 %
PLATELETS: 239 10*3/uL (ref 150–379)
RBC: 4.26 x10E6/uL (ref 3.77–5.28)
RDW: 13.6 % (ref 12.3–15.4)
WBC: 7 10*3/uL (ref 3.4–10.8)

## 2015-03-10 MED ORDER — MECLIZINE HCL 12.5 MG PO TABS: ORAL_TABLET | ORAL | Status: DC

## 2015-03-10 NOTE — Telephone Encounter (Signed)
Patient notified and agreed. Faxed Rx to pharmacy.  

## 2015-03-10 NOTE — Telephone Encounter (Signed)
Patient called and stated that she saw you yesterday for Dizziness. This morning she fell and everything started spinning. Stated that she cannot deal with the dizziness and wants some advise. Please Advise.

## 2015-03-10 NOTE — Telephone Encounter (Signed)
Lab work looks ok, may try meclizine 12.5-25 mg every 8 hours as needed for dizzy, pt needs to make sure she is staying hydrated, move slowly when transitioning from sitting to standing, sit on the side of the bed/chair a few seconds/min before standing up.

## 2015-03-13 NOTE — Telephone Encounter (Signed)
Patient called and stated that she took the Meclizine as directed and she is still feeling dizzy, nausea and weak. No falls. Wants to know what to do.Please Advise.

## 2015-03-13 NOTE — Telephone Encounter (Signed)
Please consult PT for vestibular rehab due to vertigo

## 2015-03-14 NOTE — Telephone Encounter (Signed)
Patient agreed and will call [facility]

## 2015-03-15 ENCOUNTER — Other Ambulatory Visit: Payer: Self-pay | Admitting: Internal Medicine

## 2015-03-17 ENCOUNTER — Other Ambulatory Visit: Payer: Self-pay | Admitting: Nurse Practitioner

## 2015-03-17 ENCOUNTER — Other Ambulatory Visit: Payer: Self-pay

## 2015-03-17 MED ORDER — MECLIZINE HCL 12.5 MG PO TABS: ORAL_TABLET | ORAL | Status: DC

## 2015-03-20 DIAGNOSIS — N86 Erosion and ectropion of cervix uteri: Secondary | ICD-10-CM | POA: Diagnosis not present

## 2015-03-20 DIAGNOSIS — N819 Female genital prolapse, unspecified: Secondary | ICD-10-CM | POA: Diagnosis not present

## 2015-03-22 ENCOUNTER — Other Ambulatory Visit: Payer: Self-pay | Admitting: Internal Medicine

## 2015-03-28 ENCOUNTER — Telehealth: Payer: Self-pay | Admitting: *Deleted

## 2015-03-28 DIAGNOSIS — R42 Dizziness and giddiness: Secondary | ICD-10-CM

## 2015-03-28 NOTE — Telephone Encounter (Signed)
Patient called and stated that she still had Vertigo. Did not start the PT like Janett Billow suggested. Referral placed for PT for [facility] and patient agreed.

## 2015-03-31 ENCOUNTER — Other Ambulatory Visit: Payer: Self-pay | Admitting: Internal Medicine

## 2015-04-03 DIAGNOSIS — I10 Essential (primary) hypertension: Secondary | ICD-10-CM | POA: Diagnosis not present

## 2015-04-03 DIAGNOSIS — Z9049 Acquired absence of other specified parts of digestive tract: Secondary | ICD-10-CM | POA: Diagnosis not present

## 2015-04-03 DIAGNOSIS — I252 Old myocardial infarction: Secondary | ICD-10-CM | POA: Diagnosis not present

## 2015-04-03 DIAGNOSIS — Z8673 Personal history of transient ischemic attack (TIA), and cerebral infarction without residual deficits: Secondary | ICD-10-CM | POA: Diagnosis not present

## 2015-04-03 DIAGNOSIS — I480 Paroxysmal atrial fibrillation: Secondary | ICD-10-CM | POA: Diagnosis not present

## 2015-04-03 DIAGNOSIS — Z7901 Long term (current) use of anticoagulants: Secondary | ICD-10-CM | POA: Diagnosis not present

## 2015-04-03 DIAGNOSIS — I2510 Atherosclerotic heart disease of native coronary artery without angina pectoris: Secondary | ICD-10-CM | POA: Diagnosis not present

## 2015-04-03 DIAGNOSIS — R2689 Other abnormalities of gait and mobility: Secondary | ICD-10-CM | POA: Diagnosis not present

## 2015-04-03 DIAGNOSIS — F419 Anxiety disorder, unspecified: Secondary | ICD-10-CM | POA: Diagnosis not present

## 2015-04-03 DIAGNOSIS — R42 Dizziness and giddiness: Secondary | ICD-10-CM | POA: Diagnosis not present

## 2015-04-03 DIAGNOSIS — Z955 Presence of coronary angioplasty implant and graft: Secondary | ICD-10-CM | POA: Diagnosis not present

## 2015-04-07 DIAGNOSIS — R42 Dizziness and giddiness: Secondary | ICD-10-CM | POA: Diagnosis not present

## 2015-04-07 DIAGNOSIS — F419 Anxiety disorder, unspecified: Secondary | ICD-10-CM | POA: Diagnosis not present

## 2015-04-07 DIAGNOSIS — I10 Essential (primary) hypertension: Secondary | ICD-10-CM | POA: Diagnosis not present

## 2015-04-07 DIAGNOSIS — I2510 Atherosclerotic heart disease of native coronary artery without angina pectoris: Secondary | ICD-10-CM | POA: Diagnosis not present

## 2015-04-07 DIAGNOSIS — I480 Paroxysmal atrial fibrillation: Secondary | ICD-10-CM | POA: Diagnosis not present

## 2015-04-07 DIAGNOSIS — R2689 Other abnormalities of gait and mobility: Secondary | ICD-10-CM | POA: Diagnosis not present

## 2015-04-10 DIAGNOSIS — F419 Anxiety disorder, unspecified: Secondary | ICD-10-CM | POA: Diagnosis not present

## 2015-04-10 DIAGNOSIS — R42 Dizziness and giddiness: Secondary | ICD-10-CM | POA: Diagnosis not present

## 2015-04-10 DIAGNOSIS — I2510 Atherosclerotic heart disease of native coronary artery without angina pectoris: Secondary | ICD-10-CM | POA: Diagnosis not present

## 2015-04-10 DIAGNOSIS — R2689 Other abnormalities of gait and mobility: Secondary | ICD-10-CM | POA: Diagnosis not present

## 2015-04-10 DIAGNOSIS — I480 Paroxysmal atrial fibrillation: Secondary | ICD-10-CM | POA: Diagnosis not present

## 2015-04-10 DIAGNOSIS — I10 Essential (primary) hypertension: Secondary | ICD-10-CM | POA: Diagnosis not present

## 2015-04-12 ENCOUNTER — Other Ambulatory Visit: Payer: Self-pay | Admitting: Internal Medicine

## 2015-04-13 DIAGNOSIS — N8189 Other female genital prolapse: Secondary | ICD-10-CM | POA: Diagnosis not present

## 2015-04-14 ENCOUNTER — Other Ambulatory Visit: Payer: Self-pay | Admitting: *Deleted

## 2015-04-14 MED ORDER — MECLIZINE HCL 12.5 MG PO TABS: ORAL_TABLET | ORAL | Status: DC

## 2015-04-14 NOTE — Telephone Encounter (Signed)
Marissa Bowen 

## 2015-04-19 ENCOUNTER — Encounter: Payer: Self-pay | Admitting: Internal Medicine

## 2015-04-19 ENCOUNTER — Ambulatory Visit (INDEPENDENT_AMBULATORY_CARE_PROVIDER_SITE_OTHER): Payer: Medicare Other | Admitting: Internal Medicine

## 2015-04-19 VITALS — BP 130/68 | HR 53 | Temp 98.1°F | Resp 20 | Ht 68.0 in | Wt 141.8 lb

## 2015-04-19 DIAGNOSIS — M150 Primary generalized (osteo)arthritis: Secondary | ICD-10-CM

## 2015-04-19 DIAGNOSIS — I2510 Atherosclerotic heart disease of native coronary artery without angina pectoris: Secondary | ICD-10-CM | POA: Diagnosis not present

## 2015-04-19 DIAGNOSIS — F32A Depression, unspecified: Secondary | ICD-10-CM

## 2015-04-19 DIAGNOSIS — F4321 Adjustment disorder with depressed mood: Secondary | ICD-10-CM | POA: Diagnosis not present

## 2015-04-19 DIAGNOSIS — I10 Essential (primary) hypertension: Secondary | ICD-10-CM | POA: Diagnosis not present

## 2015-04-19 DIAGNOSIS — R42 Dizziness and giddiness: Secondary | ICD-10-CM | POA: Diagnosis not present

## 2015-04-19 DIAGNOSIS — F329 Major depressive disorder, single episode, unspecified: Secondary | ICD-10-CM

## 2015-04-19 DIAGNOSIS — I480 Paroxysmal atrial fibrillation: Secondary | ICD-10-CM | POA: Diagnosis not present

## 2015-04-19 DIAGNOSIS — M159 Polyosteoarthritis, unspecified: Secondary | ICD-10-CM

## 2015-04-19 NOTE — Progress Notes (Signed)
Patient ID: Marissa Bowen, female   DOB: March 13, 1927, 81 y.o.   MRN: [id number]    Location:    [facility]   Place of Service:   OFFICE  Chief Complaint  Patient presents with  . Medical Management of Chronic Issues    2 month follow-up for Hypertension    HPI:  81 yo female seen today for f/u. She has some arthralgias but overall feels well. She had dizzy spell last month but they improved with vertigo exercises.   Depression/grief reaction - has tried 4 meds (lexapro, zoloft, remeron and trazodone) in the past and unable to tolerate ADRs. No anaphylaxis to meds tried. She misses her deceased spouse and states this Christmas is not happy for her. Some short term memory issues  HTN/CAD/hyperlipidemia/PAF - rate controlled on metoprolol and amiodarone. She takes losartan for BP. Taking eliquis for anticoagulation. On niacin for cholesterol  GERD - stable on protonix. Occasional diarrhea. Takes pepto bismul prn  Migraine - stable. No exacerbations since last OV  Hx CVA - no new sx's. Takes eliquis and niacin  She also takes HRT    Past Medical History  Diagnosis Date  . BACK PAIN, LUMBAR 09/27/2009  . CORONARY ARTERY DISEASE     a. s/p remote CABG 1997, 3V.;  b. LexiScan Myoview (8/15): No ischemia, EF 72%, normal study  . DIVERTICULITIS, HX OF 11/25/2006  . FATIGUE 07/21/2008  . GERD 11/25/2006  . HYPERLIPIDEMIA   . HYPERTENSION   . IBS 12/07/2008  . MIGRAINE WITH AURA 08/13/2007  . Muscle weakness (generalized) 09/27/2009  . OSTEOARTHRITIS 08/13/2007  . TRANSIENT ISCHEMIC ATTACK 04/13/2007  . Hiatal hernia   . Paroxysmal atrial fibrillation (HCC) Dx 02/2012  . Anxiety   . CVA (cerebral infarction)     a. Thalamic infarct 09/2009.  Marland Kitchen Anemia   . GI bleed     a. 09/2009: secondary to antral ulcer.  . Mild carotid artery disease ([place])     a. 0-39% bilat 05/2011 (f/u recommended 05/2013).  . Gastric ulcer   . Heart attack ([place])   . Acute fibrinous pericarditis   . Dysrhythmia   .  Urinary, incontinence, stress female     wears Pessary  . PONV (postoperative nausea and vomiting)     Past Surgical History  Procedure Laterality Date  . Abdominal hysterectomy  1973  . Coronary artery bypass graft  1997    Dr Melvenia Needles  . Coronary angioplasty  1997    Dr Lia Foyer  . Laparoscopic cholecystectomy  01/03/2015  . Cataract extraction w/ intraocular lens  implant, bilateral Bilateral 1999  . Tonsillectomy    . Cholecystectomy N/A 01/03/2015    Procedure: LAPAROSCOPIC CHOLECYSTECTOMY WITH INTRAOPERATIVE CHOLANGIOGRAM;  Surgeon: Donnie Mesa, MD;  Location: [place];  Service: General;  Laterality: N/A;    Patient Care Team: Gildardo Cranker, DO as PCP - General (Internal Medicine) Hillary Bow, MD (Cardiology) Melissa Montane, MD as Consulting Physician (Otolaryngology) Arvella Nigh, MD as Consulting Physician (Obstetrics and Gynecology) Rutherford Guys, MD as Consulting Physician (Ophthalmology)  Social History   Social History  . Marital Status: Widowed    Spouse Name: N/A  . Number of Children: N/A  . Years of Education: N/A   Occupational History  . Not on file.   Social History Main Topics  . Smoking status: Never Smoker   . Smokeless tobacco: Never Used  . Alcohol Use: No  . Drug Use: No  . Sexual Activity: No   Other Topics  Concern  . Not on file   Social History Narrative     reports that she has never smoked. She has never used smokeless tobacco. She reports that she does not drink alcohol or use illicit drugs.  Allergies  Allergen Reactions  . Asa [Aspirin] Other (See Comments)    Bleeding ulcer  . Hydrocodone Other (See Comments)    REACTION: migraines  . Ibuprofen Other (See Comments)    ulcers  . Oxycodone Hcl Other (See Comments)    REACTION: migraines  . Penicillins Swelling and Rash  . Prednisone Other (See Comments)    ulcers  . Statins Other (See Comments)    Aches and pains  . Pristiq [Desvenlafaxine] Other (See Comments)     Drowsiness and hangover sensation    Medications: Patient's Medications  New Prescriptions   No medications on file  Previous Medications   ACETAMINOPHEN (TYLENOL) 500 MG TABLET    Take 500-1,000 mg by mouth every 6 (six) hours as needed for moderate pain.    AMIODARONE (PACERONE) 200 MG TABLET    TAKE ONE-HALF TABLET DAILY   BISMUTH SUBSALICYLATE (PEPTO BISMOL) 262 MG/15ML SUSPENSION    Take 30 mLs by mouth every 6 (six) hours as needed for indigestion.   ELIQUIS 5 MG TABS TABLET    TAKE ONE TABLET TWICE DAILY   ESTRADIOL (ESTRACE) 0.1 MG/GM VAGINAL CREAM    Place 1 Applicatorful vaginally 2 (two) times a week.   FLUTICASONE (FLONASE) 50 MCG/ACT NASAL SPRAY    Place 2 sprays into both nostrils daily.   LORAZEPAM (ATIVAN) 1 MG TABLET    TAKE ONE TABLET EVERY EIGHT HOURS AS NEEDED FOR ANXIETY   LOSARTAN (COZAAR) 50 MG TABLET    TAKE ONE TABLET TWICE DAILY   MECLIZINE (ANTIVERT) 12.5 MG TABLET    Take one or two tablets every eight hours as needed for dizziness   METOPROLOL SUCCINATE (TOPROL-XL) 25 MG 24 HR TABLET    TAKE ONE-HALF TABLET DAILY   MISC. DEVICES (RING PESSARY) MISC    Place vaginally.   MULTIPLE VITAMIN (MULTIVITAMIN) TABLET    Take 1 tablet by mouth daily.     NIACIN 500 MG CR CAPSULE    Take 500 mg by mouth 2 (two) times daily.    PANTOPRAZOLE (PROTONIX) 40 MG TABLET    TAKE ONE TABLET TWICE DAILY   TETRAHYDROZOLINE HCL (VISINE OP)    Place 2 drops into both eyes as needed (for dry eyes).   ZOLPIDEM (AMBIEN) 10 MG TABLET    ONE TABLET AT BEDTIME FOR REST  Modified Medications   No medications on file  Discontinued Medications   No medications on file    Review of Systems  Neurological: Positive for dizziness.  Psychiatric/Behavioral:       Feels depressed  All other systems reviewed and are negative.   Filed Vitals:   04/19/15 1523  BP: 130/68  Pulse: 53  Temp: 98.1 F (36.7 C)  TempSrc: Oral  Resp: 20  Height: 5\' 8"  (1.727 m)  Weight: 141 lb 12.8 oz (64.32  kg)  SpO2: 96%   Body mass index is 21.57 kg/(m^2).  Physical Exam  Constitutional: She is oriented to person, place, and time.  Cardiovascular: Normal rate, regular rhythm and intact distal pulses.  Exam reveals no gallop and no friction rub.   Murmur (1/6 SEM) heard. No LE edema b/l. No calf TTP  Pulmonary/Chest: Effort normal and breath sounds normal. No respiratory distress. She has no wheezes.  She has no rales.  Neurological: She is alert and oriented to person, place, and time.  Skin: Skin is warm and dry. No rash noted.  Psychiatric: She has a normal mood and affect. Her behavior is normal. Thought content normal.     Labs reviewed: Office Visit on 03/09/2015  Component Date Value Ref Range Status  . Glucose 03/09/2015 85  65 - 99 mg/dL Final  . BUN 03/09/2015 11  8 - 27 mg/dL Final  . Creatinine, Ser 03/09/2015 0.57  0.57 - 1.00 mg/dL Final  . GFR calc non Af Amer 03/09/2015 84  >59 mL/min/1.73 Final  . GFR calc Af Amer 03/09/2015 97  >59 mL/min/1.73 Final  . BUN/Creatinine Ratio 03/09/2015 19  11 - 26 Final  . Sodium 03/09/2015 140  136 - 144 mmol/L Final  . Potassium 03/09/2015 4.1  3.5 - 5.2 mmol/L Final  . Chloride 03/09/2015 98  97 - 106 mmol/L Final  . CO2 03/09/2015 28  18 - 29 mmol/L Final  . Calcium 03/09/2015 9.2  8.7 - 10.3 mg/dL Final  . WBC 03/09/2015 7.0  3.4 - 10.8 x10E3/uL Final  . RBC 03/09/2015 4.26  3.77 - 5.28 x10E6/uL Final  . Hemoglobin 03/09/2015 12.9  11.1 - 15.9 g/dL Final  . Hematocrit 03/09/2015 38.4  34.0 - 46.6 % Final  . MCV 03/09/2015 90  79 - 97 fL Final  . MCH 03/09/2015 30.3  26.6 - 33.0 pg Final  . MCHC 03/09/2015 33.6  31.5 - 35.7 g/dL Final  . RDW 03/09/2015 13.6  12.3 - 15.4 % Final  . Platelets 03/09/2015 239  150 - 379 x10E3/uL Final  . Neutrophils 03/09/2015 65   Final  . Lymphs 03/09/2015 24   Final  . Monocytes 03/09/2015 9   Final  . Eos 03/09/2015 2   Final  . Basos 03/09/2015 0   Final  . Neutrophils Absolute  03/09/2015 4.5  1.4 - 7.0 x10E3/uL Final  . Lymphocytes Absolute 03/09/2015 1.6  0.7 - 3.1 x10E3/uL Final  . Monocytes Absolute 03/09/2015 0.6  0.1 - 0.9 x10E3/uL Final  . EOS (ABSOLUTE) 03/09/2015 0.2  0.0 - 0.4 x10E3/uL Final  . Basophils Absolute 03/09/2015 0.0  0.0 - 0.2 x10E3/uL Final  . Immature Granulocytes 03/09/2015 0   Final  . Immature Grans (Abs) 03/09/2015 0.0  0.0 - 0.1 x10E3/uL Final    No results found.   Assessment/Plan   ICD-9-CM ICD-10-CM   1. Depression - uncontrolled 311 F32.9   2. Grief reaction - failing to change as expected 309.0 F43.21   3. Essential hypertension - stable 401.9 I10   4. Paroxysmal atrial fibrillation (HCC) - rate controlled 427.31 I48.0   5. Dizziness and giddiness - stable 780.4 R42   6. Primary osteoarthritis involving multiple joints - stable 715.09 M15.0     Recommend you start depression med such as wellbutrin (bupropion), cymbalta or effexor (venlafexine)  Continue other medications as ordered  Continue to seek out community activities to stay involved  Follow up in 2 mos for routine visit  Jacklin Zwick S. Perlie Gold  [facility] [address] [place], [place] [address] [phone] Cell (Monday-Friday 8 AM - 5 PM) [phone] After 5 PM and follow prompts

## 2015-04-19 NOTE — Patient Instructions (Signed)
Recommend you start depression med such as wellbutrin (bupropion), cymbalta or effexor (venlafexine)  Continue other medications as ordered  Continue to seek out community activities to stay involved  Follow up in 2 mos for routine visit

## 2015-04-20 DIAGNOSIS — R2689 Other abnormalities of gait and mobility: Secondary | ICD-10-CM | POA: Diagnosis not present

## 2015-04-20 DIAGNOSIS — R42 Dizziness and giddiness: Secondary | ICD-10-CM | POA: Diagnosis not present

## 2015-04-20 DIAGNOSIS — I10 Essential (primary) hypertension: Secondary | ICD-10-CM | POA: Diagnosis not present

## 2015-04-20 DIAGNOSIS — F419 Anxiety disorder, unspecified: Secondary | ICD-10-CM | POA: Diagnosis not present

## 2015-04-20 DIAGNOSIS — I480 Paroxysmal atrial fibrillation: Secondary | ICD-10-CM | POA: Diagnosis not present

## 2015-04-20 DIAGNOSIS — I2510 Atherosclerotic heart disease of native coronary artery without angina pectoris: Secondary | ICD-10-CM | POA: Diagnosis not present

## 2015-04-26 ENCOUNTER — Other Ambulatory Visit: Payer: Self-pay | Admitting: Internal Medicine

## 2015-04-27 DIAGNOSIS — I2510 Atherosclerotic heart disease of native coronary artery without angina pectoris: Secondary | ICD-10-CM | POA: Diagnosis not present

## 2015-04-27 DIAGNOSIS — I480 Paroxysmal atrial fibrillation: Secondary | ICD-10-CM | POA: Diagnosis not present

## 2015-04-27 DIAGNOSIS — R2689 Other abnormalities of gait and mobility: Secondary | ICD-10-CM | POA: Diagnosis not present

## 2015-04-27 DIAGNOSIS — I10 Essential (primary) hypertension: Secondary | ICD-10-CM | POA: Diagnosis not present

## 2015-04-27 DIAGNOSIS — R42 Dizziness and giddiness: Secondary | ICD-10-CM | POA: Diagnosis not present

## 2015-04-27 DIAGNOSIS — F419 Anxiety disorder, unspecified: Secondary | ICD-10-CM | POA: Diagnosis not present

## 2015-04-28 ENCOUNTER — Other Ambulatory Visit: Payer: Self-pay | Admitting: Internal Medicine

## 2015-05-04 DIAGNOSIS — N819 Female genital prolapse, unspecified: Secondary | ICD-10-CM | POA: Diagnosis not present

## 2015-05-08 ENCOUNTER — Other Ambulatory Visit: Payer: Self-pay | Admitting: Internal Medicine

## 2015-05-09 DIAGNOSIS — I480 Paroxysmal atrial fibrillation: Secondary | ICD-10-CM | POA: Diagnosis not present

## 2015-05-09 DIAGNOSIS — F419 Anxiety disorder, unspecified: Secondary | ICD-10-CM | POA: Diagnosis not present

## 2015-05-09 DIAGNOSIS — R42 Dizziness and giddiness: Secondary | ICD-10-CM | POA: Diagnosis not present

## 2015-05-09 DIAGNOSIS — R2689 Other abnormalities of gait and mobility: Secondary | ICD-10-CM | POA: Diagnosis not present

## 2015-05-09 DIAGNOSIS — I2510 Atherosclerotic heart disease of native coronary artery without angina pectoris: Secondary | ICD-10-CM | POA: Diagnosis not present

## 2015-05-09 DIAGNOSIS — I10 Essential (primary) hypertension: Secondary | ICD-10-CM | POA: Diagnosis not present

## 2015-05-12 ENCOUNTER — Telehealth: Payer: Self-pay | Admitting: *Deleted

## 2015-05-12 NOTE — Telephone Encounter (Signed)
I have no recommendations as I am not familiar with that medicine. Can she bring the bottle to the office so that we can see its ingredients?

## 2015-05-12 NOTE — Telephone Encounter (Signed)
Patient called and stated that she had Gallbladder Surgery and someone told her that she could take BetaPlus and it would make her stomach feel better. She would like to know if this was ok with you first. Please Advise.

## 2015-05-15 ENCOUNTER — Other Ambulatory Visit: Payer: Self-pay | Admitting: Internal Medicine

## 2015-05-15 NOTE — Telephone Encounter (Signed)
Patient notified and will bring bottle to appointment for review.

## 2015-05-27 ENCOUNTER — Other Ambulatory Visit: Payer: Self-pay | Admitting: Cardiology

## 2015-05-27 ENCOUNTER — Other Ambulatory Visit: Payer: Self-pay | Admitting: Internal Medicine

## 2015-06-07 ENCOUNTER — Encounter: Payer: Self-pay | Admitting: Internal Medicine

## 2015-06-07 ENCOUNTER — Ambulatory Visit (INDEPENDENT_AMBULATORY_CARE_PROVIDER_SITE_OTHER): Payer: Medicare Other | Admitting: Internal Medicine

## 2015-06-07 VITALS — BP 110/70 | HR 54 | Temp 97.8°F | Resp 20 | Ht 68.0 in | Wt 142.0 lb

## 2015-06-07 DIAGNOSIS — K219 Gastro-esophageal reflux disease without esophagitis: Secondary | ICD-10-CM

## 2015-06-07 DIAGNOSIS — F418 Other specified anxiety disorders: Secondary | ICD-10-CM | POA: Diagnosis not present

## 2015-06-07 DIAGNOSIS — Z9049 Acquired absence of other specified parts of digestive tract: Secondary | ICD-10-CM | POA: Diagnosis not present

## 2015-06-07 DIAGNOSIS — M159 Polyosteoarthritis, unspecified: Secondary | ICD-10-CM

## 2015-06-07 DIAGNOSIS — M67979 Unspecified disorder of synovium and tendon, unspecified ankle and foot: Secondary | ICD-10-CM | POA: Diagnosis not present

## 2015-06-07 DIAGNOSIS — K580 Irritable bowel syndrome with diarrhea: Secondary | ICD-10-CM | POA: Diagnosis not present

## 2015-06-07 DIAGNOSIS — M150 Primary generalized (osteo)arthritis: Secondary | ICD-10-CM

## 2015-06-07 NOTE — Progress Notes (Signed)
Patient ID: Marissa Bowen, female   DOB: May 24, 1926, 82 y.o.   MRN: [id number]    Location:    [facility]   Place of Service:   OFFICE  Chief Complaint  Patient presents with  . Acute Visit    Patients c/o Stomach issues and feet and legs hurt  . Immunizations    Due pheumonia shot will take today    HPI:  82 yo female seen today for ankle and leg pain x 2-3 weeks. No known trauma. Pain is intermittent and worse after lying down at night. When she gets up to go to the restroom, she has pain in legs that improves after walking a little. She noticed hand tremors when she is reading and feels like her gait is unbalanced. Occasional sx's in day time. No numbness or tingling. She has hx chronic lumbar pain, OA.   She c/o diarrhea and lower abdominal "rolls" at times. She has occasional bowel accidents when diarrhea occurs suddenly. No relation to meals. She avoids fried foods but likes eggs, smoked sausage and hotdogs. Hx lap chole. She also has hx GERD   Past Medical History  Diagnosis Date  . BACK PAIN, LUMBAR 09/27/2009  . CORONARY ARTERY DISEASE     a. s/p remote CABG 1997, 3V.;  b. LexiScan Myoview (8/15): No ischemia, EF 72%, normal study  . DIVERTICULITIS, HX OF 11/25/2006  . FATIGUE 07/21/2008  . GERD 11/25/2006  . HYPERLIPIDEMIA   . HYPERTENSION   . IBS 12/07/2008  . MIGRAINE WITH AURA 08/13/2007  . Muscle weakness (generalized) 09/27/2009  . OSTEOARTHRITIS 08/13/2007  . TRANSIENT ISCHEMIC ATTACK 04/13/2007  . Hiatal hernia   . Paroxysmal atrial fibrillation (HCC) Dx 02/2012  . Anxiety   . CVA (cerebral infarction)     a. Thalamic infarct 09/2009.  Marland Kitchen Anemia   . GI bleed     a. 09/2009: secondary to antral ulcer.  . Mild carotid artery disease ([place])     a. 0-39% bilat 05/2011 (f/u recommended 05/2013).  . Gastric ulcer   . Heart attack ([place])   . Acute fibrinous pericarditis   . Dysrhythmia   . Urinary, incontinence, stress female     wears Pessary  . PONV (postoperative nausea and  vomiting)     Past Surgical History  Procedure Laterality Date  . Abdominal hysterectomy  1973  . Coronary artery bypass graft  1997    Dr Melvenia Needles  . Coronary angioplasty  1997    Dr Lia Foyer  . Laparoscopic cholecystectomy  01/03/2015  . Cataract extraction w/ intraocular lens  implant, bilateral Bilateral 1999  . Tonsillectomy    . Cholecystectomy N/A 01/03/2015    Procedure: LAPAROSCOPIC CHOLECYSTECTOMY WITH INTRAOPERATIVE CHOLANGIOGRAM;  Surgeon: Donnie Mesa, MD;  Location: [place];  Service: General;  Laterality: N/A;    Patient Care Team: Gildardo Cranker, DO as PCP - General (Internal Medicine) Hillary Bow, MD (Cardiology) Melissa Montane, MD as Consulting Physician (Otolaryngology) Arvella Nigh, MD as Consulting Physician (Obstetrics and Gynecology) Rutherford Guys, MD as Consulting Physician (Ophthalmology)  Social History   Social History  . Marital Status: Widowed    Spouse Name: N/A  . Number of Children: N/A  . Years of Education: N/A   Occupational History  . Not on file.   Social History Main Topics  . Smoking status: Never Smoker   . Smokeless tobacco: Never Used  . Alcohol Use: No  . Drug Use: No  . Sexual Activity: No   Other Topics  Concern  . Not on file   Social History Narrative     reports that she has never smoked. She has never used smokeless tobacco. She reports that she does not drink alcohol or use illicit drugs.  Allergies  Allergen Reactions  . Asa [Aspirin] Other (See Comments)    Bleeding ulcer  . Hydrocodone Other (See Comments)    REACTION: migraines  . Ibuprofen Other (See Comments)    ulcers  . Oxycodone Hcl Other (See Comments)    REACTION: migraines  . Penicillins Swelling and Rash  . Prednisone Other (See Comments)    ulcers  . Statins Other (See Comments)    Aches and pains  . Pristiq [Desvenlafaxine] Other (See Comments)    Drowsiness and hangover sensation    Medications: Patient's Medications  New Prescriptions    No medications on file  Previous Medications   ACETAMINOPHEN (TYLENOL) 500 MG TABLET    Take 500-1,000 mg by mouth every 6 (six) hours as needed for moderate pain.    AMIODARONE (PACERONE) 200 MG TABLET    TAKE ONE-HALF TABLET DAILY   BISMUTH SUBSALICYLATE (PEPTO BISMOL) 262 MG/15ML SUSPENSION    Take 30 mLs by mouth every 6 (six) hours as needed for indigestion.   ELIQUIS 5 MG TABS TABLET    TAKE ONE TABLET TWICE DAILY   ESTRADIOL (ESTRACE) 0.1 MG/GM VAGINAL CREAM    Place 1 Applicatorful vaginally 2 (two) times a week.   FLUTICASONE (FLONASE) 50 MCG/ACT NASAL SPRAY    Place 2 sprays into both nostrils daily.   LORAZEPAM (ATIVAN) 1 MG TABLET    TAKE ONE TABLET EVERY EIGHT HOURS AS NEEDED FOR ANXIETY   LOSARTAN (COZAAR) 50 MG TABLET    TAKE ONE TABLET TWICE DAILY   MECLIZINE (ANTIVERT) 12.5 MG TABLET    Take one or two tablets every eight hours as needed for dizziness   METOPROLOL SUCCINATE (TOPROL-XL) 25 MG 24 HR TABLET    TAKE ONE-HALF TABLET DAILY   MISC. DEVICES (RING PESSARY) MISC    Place vaginally.   MULTIPLE VITAMIN (MULTIVITAMIN) TABLET    Take 1 tablet by mouth daily.     NIACIN 500 MG CR CAPSULE    Take 500 mg by mouth 2 (two) times daily.    PANTOPRAZOLE (PROTONIX) 40 MG TABLET    TAKE ONE TABLET TWICE DAILY   TETRAHYDROZOLINE HCL (VISINE OP)    Place 2 drops into both eyes as needed (for dry eyes).   ZOLPIDEM (AMBIEN) 10 MG TABLET    Take 1 tablet (10 mg total) by mouth at bedtime as needed for sleep.  Modified Medications   No medications on file  Discontinued Medications   ZOLPIDEM (AMBIEN) 10 MG TABLET    ONE TABLET AT BEDTIME FOR REST   ZOLPIDEM (AMBIEN) 10 MG TABLET    ONE TABLET AT BEDTIME FOR REST    Review of Systems  Constitutional: Positive for fatigue.  Gastrointestinal: Positive for abdominal pain and diarrhea.  Musculoskeletal: Positive for back pain and arthralgias.  Neurological: Positive for tremors.    Filed Vitals:   06/07/15 1129  BP: 110/70  Pulse:  54  Temp: 97.8 F (36.6 C)  TempSrc: Oral  Resp: 20  Height: 5\' 8"  (1.727 m)  Weight: 142 lb (64.411 kg)  SpO2: 96%   Body mass index is 21.6 kg/(m^2).  Physical Exam  Constitutional: She is oriented to person, place, and time. She appears well-developed.  Frail appearing in NAD   HENT:  Mouth/Throat: Oropharynx is clear and moist. No oropharyngeal exudate.  Eyes: Pupils are equal, round, and reactive to light. No scleral icterus.  Neck: Neck supple. Carotid bruit is not present. No tracheal deviation present.  Cardiovascular: Normal rate, regular rhythm and intact distal pulses.   Occasional extrasystoles are present. Exam reveals no gallop and no friction rub.   Murmur (1/6 SEM) heard. No LE edema b/l. No calf TTP  Pulmonary/Chest: Effort normal and breath sounds normal. No stridor. No respiratory distress. She has no wheezes. She has no rales.  Abdominal: Soft. Bowel sounds are normal. She exhibits no distension and no mass. There is no hepatomegaly. There is tenderness (epigastric ). There is no rebound and no guarding.  Musculoskeletal: She exhibits edema and tenderness.       Back:  R>L achilles tendon hypertrophy but no TTP  Lymphadenopathy:    She has no cervical adenopathy.  Neurological: She is alert and oriented to person, place, and time. She has normal reflexes. She displays tremor (resting).  Skin: Skin is warm and dry. No rash noted.  Psychiatric: Her behavior is normal. Judgment and thought content normal. Her mood appears anxious.     Labs reviewed: Office Visit on 03/09/2015  Component Date Value Ref Range Status  . Glucose 03/09/2015 85  65 - 99 mg/dL Final  . BUN 03/09/2015 11  8 - 27 mg/dL Final  . Creatinine, Ser 03/09/2015 0.57  0.57 - 1.00 mg/dL Final  . GFR calc non Af Amer 03/09/2015 84  >59 mL/min/1.73 Final  . GFR calc Af Amer 03/09/2015 97  >59 mL/min/1.73 Final  . BUN/Creatinine Ratio 03/09/2015 19  11 - 26 Final  . Sodium 03/09/2015 140  136  - 144 mmol/L Final  . Potassium 03/09/2015 4.1  3.5 - 5.2 mmol/L Final  . Chloride 03/09/2015 98  97 - 106 mmol/L Final  . CO2 03/09/2015 28  18 - 29 mmol/L Final  . Calcium 03/09/2015 9.2  8.7 - 10.3 mg/dL Final  . WBC 03/09/2015 7.0  3.4 - 10.8 x10E3/uL Final  . RBC 03/09/2015 4.26  3.77 - 5.28 x10E6/uL Final  . Hemoglobin 03/09/2015 12.9  11.1 - 15.9 g/dL Final  . Hematocrit 03/09/2015 38.4  34.0 - 46.6 % Final  . MCV 03/09/2015 90  79 - 97 fL Final  . MCH 03/09/2015 30.3  26.6 - 33.0 pg Final  . MCHC 03/09/2015 33.6  31.5 - 35.7 g/dL Final  . RDW 03/09/2015 13.6  12.3 - 15.4 % Final  . Platelets 03/09/2015 239  150 - 379 x10E3/uL Final  . Neutrophils 03/09/2015 65   Final  . Lymphs 03/09/2015 24   Final  . Monocytes 03/09/2015 9   Final  . Eos 03/09/2015 2   Final  . Basos 03/09/2015 0   Final  . Neutrophils Absolute 03/09/2015 4.5  1.4 - 7.0 x10E3/uL Final  . Lymphocytes Absolute 03/09/2015 1.6  0.7 - 3.1 x10E3/uL Final  . Monocytes Absolute 03/09/2015 0.6  0.1 - 0.9 x10E3/uL Final  . EOS (ABSOLUTE) 03/09/2015 0.2  0.0 - 0.4 x10E3/uL Final  . Basophils Absolute 03/09/2015 0.0  0.0 - 0.2 x10E3/uL Final  . Immature Granulocytes 03/09/2015 0   Final  . Immature Grans (Abs) 03/09/2015 0.0  0.0 - 0.1 x10E3/uL Final    No results found.   Assessment/Plan   ICD-9-CM ICD-10-CM   1. Achilles tendon disorder, unspecified laterality 727.9 M67.979    R>L  2. Primary osteoarthritis involving multiple joints 715.09 M15.0  3. Irritable bowel syndrome with diarrhea 564.1 K58.0   4. Gastroesophageal reflux disease without esophagitis 530.81 K21.9   5. S/P laparoscopic cholecystectomy V45.89 Z90.49   6. Depression with anxiety 300.4 F41.8     Education hand out on low fat foods given  Reduce stress  Continue current medications as ordered  Recommend achilles tendon stretching exercises prior to getting out of bed or standing from seated position  Follow up as scheduled  Karesha Trzcinski  S. Perlie Gold  [facility] and [facility] [address] [place], [place] [address] [phone] Cell (Monday-Friday 8 AM - 5 PM) [phone] After 5 PM and follow prompts

## 2015-06-07 NOTE — Patient Instructions (Signed)
Follow low fat diet  Reduce stress  Continue current medications as ordered  Recommend achilles tendon stretching exercises prior to getting out of bed or standing from seated position  Follow up as scheduled  Low-Fat Diet for Gallbladder Conditions A low-fat diet can be helpful if you have pancreatitis or a gallbladder condition. With these conditions, your pancreas and gallbladder have trouble digesting fats. A healthy eating plan with less fat will help rest your pancreas and gallbladder and reduce your symptoms. WHAT DO I NEED TO KNOW ABOUT THIS DIET?  Eat a low-fat diet.  Reduce your fat intake to less than 20-30% of your total daily calories. This is less than 50-60 g of fat per day.  Remember that you need some fat in your diet. Ask your dietician what your daily goal should be.  Choose nonfat and low-fat healthy foods. Look for the words "nonfat," "low fat," or "fat free."  As a guide, look on the label and choose foods with less than 3 g of fat per serving. Eat only one serving.  Avoid alcohol.  Do not smoke. If you need help quitting, talk with your health care provider.  Eat small frequent meals instead of three large heavy meals. WHAT FOODS CAN I EAT? Grains Include healthy grains and starches such as potatoes, wheat bread, fiber-rich cereal, and brown rice. Choose whole grain options whenever possible. In adults, whole grains should account for 45-65% of your daily calories.  Fruits and Vegetables Eat plenty of fruits and vegetables. Fresh fruits and vegetables add fiber to your diet. Meats and Other Protein Sources Eat lean meat such as chicken and pork. Trim any fat off of meat before cooking it. Eggs, fish, and beans are other sources of protein. In adults, these foods should account for 10-35% of your daily calories. Dairy Choose low-fat milk and dairy options. Dairy includes fat and protein, as well as calcium.  Fats and Oils Limit high-fat foods such as fried  foods, sweets, baked goods, sugary drinks.  Other Creamy sauces and condiments, such as mayonnaise, can add extra fat. Think about whether or not you need to use them, or use smaller amounts or low fat options. WHAT FOODS ARE NOT RECOMMENDED?  High fat foods, such as:  [employer].  Ice cream.  [place] toast.  Sweet rolls.  Pizza.  Cheese bread.  Foods covered with batter, butter, creamy sauces, or cheese.  Fried foods.  Sugary drinks and desserts.  Foods that cause gas or bloating   This information is not intended to replace advice given to you by your health care provider. Make sure you discuss any questions you have with your health care provider.   Document Released: 04/20/2013 Document Reviewed: 04/20/2013 Elsevier Interactive Patient Education [employer].

## 2015-06-19 ENCOUNTER — Other Ambulatory Visit: Payer: Self-pay | Admitting: Internal Medicine

## 2015-06-21 ENCOUNTER — Encounter: Payer: Self-pay | Admitting: Internal Medicine

## 2015-06-21 ENCOUNTER — Ambulatory Visit (INDEPENDENT_AMBULATORY_CARE_PROVIDER_SITE_OTHER): Payer: Medicare Other | Admitting: Internal Medicine

## 2015-06-21 ENCOUNTER — Other Ambulatory Visit: Payer: Self-pay | Admitting: *Deleted

## 2015-06-21 VITALS — BP 158/70 | HR 56 | Temp 98.1°F | Resp 20 | Ht 68.0 in | Wt 142.2 lb

## 2015-06-21 DIAGNOSIS — I480 Paroxysmal atrial fibrillation: Secondary | ICD-10-CM

## 2015-06-21 DIAGNOSIS — N8189 Other female genital prolapse: Secondary | ICD-10-CM | POA: Diagnosis not present

## 2015-06-21 DIAGNOSIS — E785 Hyperlipidemia, unspecified: Secondary | ICD-10-CM | POA: Diagnosis not present

## 2015-06-21 DIAGNOSIS — F418 Other specified anxiety disorders: Secondary | ICD-10-CM

## 2015-06-21 DIAGNOSIS — M7072 Other bursitis of hip, left hip: Secondary | ICD-10-CM | POA: Diagnosis not present

## 2015-06-21 DIAGNOSIS — M150 Primary generalized (osteo)arthritis: Secondary | ICD-10-CM | POA: Diagnosis not present

## 2015-06-21 DIAGNOSIS — M159 Polyosteoarthritis, unspecified: Secondary | ICD-10-CM

## 2015-06-21 DIAGNOSIS — I10 Essential (primary) hypertension: Secondary | ICD-10-CM

## 2015-06-21 MED ORDER — LORAZEPAM 1 MG PO TABS: ORAL_TABLET | ORAL | Status: DC

## 2015-06-21 NOTE — Patient Instructions (Addendum)
Recommend that you take Melatonin 3mg  at bedtime. May take with ambien.  Recommend Tylenol as needed for pain. Use hot compress to left hip as needed. If pain does not improve, may need Ortho evaluation  Continue current medications as ordered  Follow up in 3 mos for routine visit  Get fasting labs in 1-2 weeks

## 2015-06-21 NOTE — Telephone Encounter (Signed)
Maggie Font called and stated that they requested a refill yesterday and Rx was DENIED stating it was too soon to refill by [facility]. Patient's last RF was 05/15/2015. Apoligized and gave verbal to pharmacist.

## 2015-06-21 NOTE — Progress Notes (Signed)
Patient ID: Marissa Bowen, female   DOB: 1926/07/28, 82 y.o.   MRN: [id number]    Location:    [facility]   Place of Service:   OFFICE  Chief Complaint  Patient presents with  . Medical Management of Chronic Issues    2 mo f/u    HPI:  82 yo female seen today for f/u. She saw GYN this AM and had pessary changed. She was tx for bladder infection. She was rx estrace cream also.   Depression/grief reaction - has tried 4 meds (lexapro, zoloft, remeron and trazodone) in the past and unable to tolerate ADRs. No anaphylaxis to meds tried. She misses her deceased spouse but overall mood is improved. Some short term memory issues. Takes prn lorazepam and prn ambien. She has difficulty staying asleep.  HTN/CAD/hyperlipidemia/PAF - rate controlled on metoprolol and amiodarone. She takes losartan for BP. Taking eliquis for anticoagulation. On niacin for cholesterol  GERD - stable on protonix. Occasional diarrhea. Takes pepto bismul prn  Migraine - stable. No exacerbations since last OV  Hx CVA - no new sx's. Takes eliquis and niacin  She also takes HRT - estrace cream  Past Medical History  Diagnosis Date  . BACK PAIN, LUMBAR 09/27/2009  . CORONARY ARTERY DISEASE     a. s/p remote CABG 1997, 3V.;  b. LexiScan Myoview (8/15): No ischemia, EF 72%, normal study  . DIVERTICULITIS, HX OF 11/25/2006  . FATIGUE 07/21/2008  . GERD 11/25/2006  . HYPERLIPIDEMIA   . HYPERTENSION   . IBS 12/07/2008  . MIGRAINE WITH AURA 08/13/2007  . Muscle weakness (generalized) 09/27/2009  . OSTEOARTHRITIS 08/13/2007  . TRANSIENT ISCHEMIC ATTACK 04/13/2007  . Hiatal hernia   . Paroxysmal atrial fibrillation (HCC) Dx 02/2012  . Anxiety   . CVA (cerebral infarction)     a. Thalamic infarct 09/2009.  Marland Kitchen Anemia   . GI bleed     a. 09/2009: secondary to antral ulcer.  . Mild carotid artery disease ([place])     a. 0-39% bilat 05/2011 (f/u recommended 05/2013).  . Gastric ulcer   . Heart attack ([place])   . Acute fibrinous pericarditis    . Dysrhythmia   . Urinary, incontinence, stress female     wears Pessary  . PONV (postoperative nausea and vomiting)     Past Surgical History  Procedure Laterality Date  . Abdominal hysterectomy  1973  . Coronary artery bypass graft  1997    Dr Melvenia Needles  . Coronary angioplasty  1997    Dr Lia Foyer  . Laparoscopic cholecystectomy  01/03/2015  . Cataract extraction w/ intraocular lens  implant, bilateral Bilateral 1999  . Tonsillectomy    . Cholecystectomy N/A 01/03/2015    Procedure: LAPAROSCOPIC CHOLECYSTECTOMY WITH INTRAOPERATIVE CHOLANGIOGRAM;  Surgeon: Donnie Mesa, MD;  Location: [place];  Service: General;  Laterality: N/A;    Patient Care Team: Gildardo Cranker, DO as PCP - General (Internal Medicine) Hillary Bow, MD (Cardiology) Melissa Montane, MD as Consulting Physician (Otolaryngology) Arvella Nigh, MD as Consulting Physician (Obstetrics and Gynecology) Rutherford Guys, MD as Consulting Physician (Ophthalmology)  Social History   Social History  . Marital Status: Widowed    Spouse Name: N/A  . Number of Children: N/A  . Years of Education: N/A   Occupational History  . Not on file.   Social History Main Topics  . Smoking status: Never Smoker   . Smokeless tobacco: Never Used  . Alcohol Use: No  . Drug Use: No  .  Sexual Activity: No   Other Topics Concern  . Not on file   Social History Narrative     reports that she has never smoked. She has never used smokeless tobacco. She reports that she does not drink alcohol or use illicit drugs.  Allergies  Allergen Reactions  . Asa [Aspirin] Other (See Comments)    Bleeding ulcer  . Hydrocodone Other (See Comments)    REACTION: migraines  . Ibuprofen Other (See Comments)    ulcers  . Oxycodone Hcl Other (See Comments)    REACTION: migraines  . Penicillins Swelling and Rash  . Prednisone Other (See Comments)    ulcers  . Statins Other (See Comments)    Aches and pains  . Pristiq [Desvenlafaxine] Other (See  Comments)    Drowsiness and hangover sensation    Medications: Patient's Medications  New Prescriptions   No medications on file  Previous Medications   ACETAMINOPHEN (TYLENOL) 500 MG TABLET    Take 500-1,000 mg by mouth every 6 (six) hours as needed for moderate pain.    AMIODARONE (PACERONE) 200 MG TABLET    TAKE ONE-HALF TABLET DAILY   BISMUTH SUBSALICYLATE (PEPTO BISMOL) 262 MG/15ML SUSPENSION    Take 30 mLs by mouth every 6 (six) hours as needed for indigestion.   ELIQUIS 5 MG TABS TABLET    TAKE ONE TABLET TWICE DAILY   ESTRADIOL (ESTRACE) 0.1 MG/GM VAGINAL CREAM    Place 1 Applicatorful vaginally 2 (two) times a week.   FLUTICASONE (FLONASE) 50 MCG/ACT NASAL SPRAY    Place 2 sprays into both nostrils daily.   LORAZEPAM (ATIVAN) 1 MG TABLET    Take one tablet by mouth every eight hours as needed for anxiety   LOSARTAN (COZAAR) 50 MG TABLET    TAKE ONE TABLET TWICE DAILY   MECLIZINE (ANTIVERT) 12.5 MG TABLET    Take one or two tablets every eight hours as needed for dizziness   METOPROLOL SUCCINATE (TOPROL-XL) 25 MG 24 HR TABLET    TAKE ONE-HALF TABLET DAILY   MISC. DEVICES (RING PESSARY) MISC    Place vaginally.   MULTIPLE VITAMIN (MULTIVITAMIN) TABLET    Take 1 tablet by mouth daily.     NIACIN 500 MG CR CAPSULE    Take 500 mg by mouth 2 (two) times daily.    PANTOPRAZOLE (PROTONIX) 40 MG TABLET    TAKE ONE TABLET TWICE DAILY   PROMETHAZINE (PHENERGAN) 25 MG TABLET    Take 25 mg by mouth every 6 (six) hours as needed for nausea or vomiting.   TETRAHYDROZOLINE HCL (VISINE OP)    Place 2 drops into both eyes as needed (for dry eyes).   ZOLPIDEM (AMBIEN) 10 MG TABLET    Take 1 tablet (10 mg total) by mouth at bedtime as needed for sleep.  Modified Medications   No medications on file  Discontinued Medications   No medications on file    Review of Systems  Unable to perform ROS: Psychiatric disorder    Filed Vitals:   06/21/15 1352  BP: 158/70  Pulse: 56  Temp: 98.1 F  (36.7 C)  TempSrc: Oral  Resp: 20  Height: 5\' 8"  (1.727 m)  Weight: 142 lb 3.2 oz (64.501 kg)  SpO2: 97%   Body mass index is 21.63 kg/(m^2).  Physical Exam  Constitutional: She appears well-developed and well-nourished.  HENT:  Mouth/Throat: Oropharynx is clear and moist. No oropharyngeal exudate.  Eyes: Pupils are equal, round, and reactive to light. No  scleral icterus.  Neck: Neck supple. Carotid bruit is not present. No tracheal deviation present. No thyromegaly present.  Cardiovascular: Normal rate and intact distal pulses.  An irregularly irregular rhythm present. Exam reveals no gallop and no friction rub.   Murmur (1/6 SEM) heard. No LE edema b/l. no calf TTP.   Pulmonary/Chest: Effort normal and breath sounds normal. No stridor. No respiratory distress. She has no wheezes. She has no rales.  Abdominal: Soft. Bowel sounds are normal. She exhibits no distension and no mass. There is no hepatomegaly. There is no tenderness. There is no rebound and no guarding.  Musculoskeletal: She exhibits edema.  Lymphadenopathy:    She has no cervical adenopathy.  Neurological: She is alert.  Skin: Skin is warm and dry. No rash noted.  Psychiatric: She has a normal mood and affect. Her behavior is normal.     Labs reviewed: No visits with results within 3 Month(s) from this visit. Latest known visit with results is:  Office Visit on 03/09/2015  Component Date Value Ref Range Status  . Glucose 03/09/2015 85  65 - 99 mg/dL Final  . BUN 03/09/2015 11  8 - 27 mg/dL Final  . Creatinine, Ser 03/09/2015 0.57  0.57 - 1.00 mg/dL Final  . GFR calc non Af Amer 03/09/2015 84  >59 mL/min/1.73 Final  . GFR calc Af Amer 03/09/2015 97  >59 mL/min/1.73 Final  . BUN/Creatinine Ratio 03/09/2015 19  11 - 26 Final  . Sodium 03/09/2015 140  136 - 144 mmol/L Final  . Potassium 03/09/2015 4.1  3.5 - 5.2 mmol/L Final  . Chloride 03/09/2015 98  97 - 106 mmol/L Final  . CO2 03/09/2015 28  18 - 29 mmol/L  Final  . Calcium 03/09/2015 9.2  8.7 - 10.3 mg/dL Final  . WBC 03/09/2015 7.0  3.4 - 10.8 x10E3/uL Final  . RBC 03/09/2015 4.26  3.77 - 5.28 x10E6/uL Final  . Hemoglobin 03/09/2015 12.9  11.1 - 15.9 g/dL Final  . Hematocrit 03/09/2015 38.4  34.0 - 46.6 % Final  . MCV 03/09/2015 90  79 - 97 fL Final  . MCH 03/09/2015 30.3  26.6 - 33.0 pg Final  . MCHC 03/09/2015 33.6  31.5 - 35.7 g/dL Final  . RDW 03/09/2015 13.6  12.3 - 15.4 % Final  . Platelets 03/09/2015 239  150 - 379 x10E3/uL Final  . Neutrophils 03/09/2015 65   Final  . Lymphs 03/09/2015 24   Final  . Monocytes 03/09/2015 9   Final  . Eos 03/09/2015 2   Final  . Basos 03/09/2015 0   Final  . Neutrophils Absolute 03/09/2015 4.5  1.4 - 7.0 x10E3/uL Final  . Lymphocytes Absolute 03/09/2015 1.6  0.7 - 3.1 x10E3/uL Final  . Monocytes Absolute 03/09/2015 0.6  0.1 - 0.9 x10E3/uL Final  . EOS (ABSOLUTE) 03/09/2015 0.2  0.0 - 0.4 x10E3/uL Final  . Basophils Absolute 03/09/2015 0.0  0.0 - 0.2 x10E3/uL Final  . Immature Granulocytes 03/09/2015 0   Final  . Immature Grans (Abs) 03/09/2015 0.0  0.0 - 0.1 x10E3/uL Final    No results found.   Assessment/Plan   ICD-9-CM ICD-10-CM   1. Hip bursitis, left 726.5 M70.72   2. Essential hypertension 401.9 99991111 Basic Metabolic Panel     ALT  3. Hyperlipidemia LDL goal <100 272.4 99991111 Basic Metabolic Panel     ALT     Lipid Panel  4. Depression with anxiety 300.4 F41.8   5. Paroxysmal atrial fibrillation (HCC)  427.31 [id number] Basic Metabolic Panel  6. Primary osteoarthritis involving multiple joints 715.09 M15.0    Recommend that you take Melatonin 3mg  at bedtime. May take with ambien.  Recommend Tylenol as needed for pain. Use hot compress to left hip as needed. If pain does not improve, may need Ortho evaluation  Continue current medications as ordered  Follow up in 3 mos for routine visit  Get fasting labs in 1-2 weeks   Aarya Quebedeaux S. Perlie Gold  [facility]  and Adult Medicine [address] [place], [place] [address] [phone] Cell (Monday-Friday 8 AM - 5 PM) [phone] After 5 PM and follow prompts

## 2015-06-27 ENCOUNTER — Other Ambulatory Visit: Payer: Self-pay | Admitting: Internal Medicine

## 2015-07-05 ENCOUNTER — Other Ambulatory Visit: Payer: Medicare Other

## 2015-07-05 DIAGNOSIS — E785 Hyperlipidemia, unspecified: Secondary | ICD-10-CM | POA: Diagnosis not present

## 2015-07-05 DIAGNOSIS — I480 Paroxysmal atrial fibrillation: Secondary | ICD-10-CM | POA: Diagnosis not present

## 2015-07-05 DIAGNOSIS — I10 Essential (primary) hypertension: Secondary | ICD-10-CM | POA: Diagnosis not present

## 2015-07-06 LAB — BASIC METABOLIC PANEL
BUN / CREAT RATIO: 17 (ref 11–26)
BUN: 12 mg/dL (ref 8–27)
CO2: 27 mmol/L (ref 18–29)
CREATININE: 0.71 mg/dL (ref 0.57–1.00)
Calcium: 9 mg/dL (ref 8.7–10.3)
Chloride: 97 mmol/L (ref 96–106)
GFR calc non Af Amer: 77 mL/min/{1.73_m2} (ref >59)
GFR, EST AFRICAN AMERICAN: 89 mL/min/{1.73_m2} (ref >59)
GLUCOSE: 82 mg/dL (ref 65–99)
Potassium: 4 mmol/L (ref 3.5–5.2)
SODIUM: 140 mmol/L (ref 134–144)

## 2015-07-06 LAB — LIPID PANEL
CHOL/HDL RATIO: 3.4 ratio (ref 0.0–4.4)
Cholesterol, Total: 209 mg/dL — ABNORMAL HIGH (ref 100–199)
HDL: 61 mg/dL (ref >39)
LDL Calculated: 124 mg/dL — ABNORMAL HIGH (ref 0–99)
Triglycerides: 120 mg/dL (ref 0–149)
VLDL CHOLESTEROL CAL: 24 mg/dL (ref 5–40)

## 2015-07-06 LAB — ALT: ALT: 13 IU/L (ref 0–32)

## 2015-07-11 ENCOUNTER — Other Ambulatory Visit: Payer: Self-pay | Admitting: Internal Medicine

## 2015-07-11 ENCOUNTER — Other Ambulatory Visit: Payer: Self-pay | Admitting: Cardiovascular Disease

## 2015-07-11 DIAGNOSIS — I6523 Occlusion and stenosis of bilateral carotid arteries: Secondary | ICD-10-CM

## 2015-07-13 ENCOUNTER — Ambulatory Visit (HOSPITAL_COMMUNITY)
Admission: RE | Admit: 2015-07-13 | Discharge: 2015-07-13 | Disposition: A | Discharge status: Home / Self Care | Payer: Medicare Other | Source: Ambulatory Visit | Attending: Cardiology | Admitting: Cardiology

## 2015-07-13 DIAGNOSIS — E785 Hyperlipidemia, unspecified: Secondary | ICD-10-CM | POA: Insufficient documentation

## 2015-07-13 DIAGNOSIS — K219 Gastro-esophageal reflux disease without esophagitis: Secondary | ICD-10-CM | POA: Diagnosis not present

## 2015-07-13 DIAGNOSIS — I10 Essential (primary) hypertension: Secondary | ICD-10-CM | POA: Diagnosis not present

## 2015-07-13 DIAGNOSIS — I6523 Occlusion and stenosis of bilateral carotid arteries: Secondary | ICD-10-CM | POA: Diagnosis not present

## 2015-07-19 ENCOUNTER — Other Ambulatory Visit: Payer: Self-pay | Admitting: Internal Medicine

## 2015-07-25 ENCOUNTER — Other Ambulatory Visit: Payer: Self-pay | Admitting: Nurse Practitioner

## 2015-07-25 ENCOUNTER — Other Ambulatory Visit: Payer: Self-pay | Admitting: Internal Medicine

## 2015-07-25 NOTE — Telephone Encounter (Signed)
Printed in error. Please disregard.

## 2015-07-27 ENCOUNTER — Other Ambulatory Visit: Payer: Self-pay | Admitting: Internal Medicine

## 2015-07-27 ENCOUNTER — Other Ambulatory Visit: Payer: Self-pay | Admitting: Cardiology

## 2015-08-02 DIAGNOSIS — N8189 Other female genital prolapse: Secondary | ICD-10-CM | POA: Diagnosis not present

## 2015-08-04 ENCOUNTER — Other Ambulatory Visit: Payer: Self-pay | Admitting: Cardiology

## 2015-08-10 ENCOUNTER — Ambulatory Visit (INDEPENDENT_AMBULATORY_CARE_PROVIDER_SITE_OTHER): Payer: Medicare Other | Admitting: Physician Assistant

## 2015-08-10 ENCOUNTER — Encounter: Payer: Self-pay | Admitting: Physician Assistant

## 2015-08-10 VITALS — BP 120/52 | HR 52 | Ht 68.0 in | Wt 143.8 lb

## 2015-08-10 DIAGNOSIS — I10 Essential (primary) hypertension: Secondary | ICD-10-CM

## 2015-08-10 DIAGNOSIS — E785 Hyperlipidemia, unspecified: Secondary | ICD-10-CM

## 2015-08-10 DIAGNOSIS — I6529 Occlusion and stenosis of unspecified carotid artery: Secondary | ICD-10-CM | POA: Insufficient documentation

## 2015-08-10 DIAGNOSIS — R5383 Other fatigue: Secondary | ICD-10-CM | POA: Diagnosis not present

## 2015-08-10 DIAGNOSIS — Z79899 Other long term (current) drug therapy: Secondary | ICD-10-CM

## 2015-08-10 DIAGNOSIS — I2510 Atherosclerotic heart disease of native coronary artery without angina pectoris: Secondary | ICD-10-CM

## 2015-08-10 DIAGNOSIS — I4891 Unspecified atrial fibrillation: Secondary | ICD-10-CM | POA: Diagnosis not present

## 2015-08-10 DIAGNOSIS — I6523 Occlusion and stenosis of bilateral carotid arteries: Secondary | ICD-10-CM

## 2015-08-10 DIAGNOSIS — I480 Paroxysmal atrial fibrillation: Secondary | ICD-10-CM

## 2015-08-10 LAB — CBC WITH DIFFERENTIAL/PLATELET
Basophils Absolute: 62 cells/uL (ref 0–200)
Basophils Relative: 1 %
Eosinophils Absolute: 248 cells/uL (ref 15–500)
Eosinophils Relative: 4 %
HEMATOCRIT: 36.8 % (ref 35.0–45.0)
HEMOGLOBIN: 12.3 g/dL (ref 11.7–15.5)
LYMPHS ABS: 1674 {cells}/uL (ref 850–3900)
Lymphocytes Relative: 27 %
MCH: 30.4 pg (ref 27.0–33.0)
MCHC: 33.4 g/dL (ref 32.0–36.0)
MCV: 90.9 fL (ref 80.0–100.0)
MONO ABS: 744 {cells}/uL (ref 200–950)
MPV: 9.7 fL (ref 7.5–12.5)
Monocytes Relative: 12 %
Neutro Abs: 3472 cells/uL (ref 1500–7800)
Neutrophils Relative %: 56 %
Platelets: 229 10*3/uL (ref 140–400)
RBC: 4.05 MIL/uL (ref 3.80–5.10)
RDW: 13.2 % (ref 11.0–15.0)
WBC: 6.2 10*3/uL (ref 3.8–10.8)

## 2015-08-10 LAB — HEPATIC FUNCTION PANEL
ALT: 13 U/L (ref 6–29)
AST: 15 U/L (ref 10–35)
Albumin: 4.1 g/dL (ref 3.6–5.1)
Alkaline Phosphatase: 79 U/L (ref 33–130)
BILIRUBIN DIRECT: 0.1 mg/dL (ref <=0.2)
BILIRUBIN INDIRECT: 0.3 mg/dL (ref 0.2–1.2)
BILIRUBIN TOTAL: 0.4 mg/dL (ref 0.2–1.2)
Total Protein: 6.9 g/dL (ref 6.1–8.1)

## 2015-08-10 LAB — TSH: TSH: 1.85 mIU/L

## 2015-08-10 NOTE — Progress Notes (Signed)
Cardiology Office Note:    Date:  08/10/2015   ID:  Marissa Bowen, DOB [birth year], MRN [id number]  PCP:  Gildardo Cranker, DO  Cardiologist:  Dr. Loralie Champagne   Electrophysiologist:  n/a  Referring MD: Gildardo Cranker, DO   Chief Complaint  Patient presents with  . Follow-up    CAD, PAF    History of Present Illness:     Marissa Bowen is a 82 y.o. female with a hx of CAD s/p CABG in 1997, PAF, HL, HTN.  She had recurrent AF in 2015. She has been managed with Apixaban for anticoagulation and Dronedarone for rhythm control.  She had to change Dronedarone to Amiodarone due to cost.  She had to change ACE inhibitor to angiotensin receptor blocker due to cough. Last seen 8/16 for surgical clearance.  She needed a lap chole.  CHADS2-VASc=6 (age, female, CVA, vascular dz). Creatinine Cl (C-G equation):  57.83 mL/min Anticoagulation:  Apixaban (SCr < 1.5, weight > 60 kg) 5 mg bid  Rhythm agent - Amiodarone  Returns for FU.  Denies chest pain or dyspnea.  Denies edema, PND.  Denies syncope or near syncope.  She did fall several mos again.  She says she tripped.  She hit her head.  She did see PT for vestib rehab for BPPV.  This is improved.  She admits to significant fatigue and issues with balance.  She denies any bleeding issues.     Past Medical History  Diagnosis Date  . BACK PAIN, LUMBAR 09/27/2009  . CAD (coronary artery disease)     a. s/p remote CABG 1997, 3V.;  b. LexiScan Myoview (8/15): No ischemia, EF 72%, normal study;  c. Echo 12/13 - EF 60-65%, no significant valvular abnormalities, normal RV size and systolic function.   Marland Kitchen PAF (paroxysmal atrial fibrillation) ([place]) 11/25/2006    a. Multaq >> changes to Amiodarone 2/2 $$  //  Eliquis (CHADS2-VASc = 6)  . GERD 11/25/2006  . HYPERLIPIDEMIA   . HYPERTENSION   . IBS 12/07/2008  . MIGRAINE WITH AURA 08/13/2007  . Muscle weakness (generalized) 09/27/2009  . OSTEOARTHRITIS 08/13/2007  . Hiatal hernia   . History of diverticulitis  Dx 02/2012  . Anxiety   . CVA (cerebral infarction)     a. Thalamic infarct 09/2009. //  b. hx of TIA in 2008  . Anemia   . GI bleed     a. 09/2009: secondary to antral ulcer.  . Mild carotid artery disease ([place])     a. 0-39% bilat 05/2011 (f/u recommended 05/2013). //  b. Carotid US 3/17 - Bilat ICA 1-39% >> FU prn  . Gastric ulcer   . History of MI (myocardial infarction)   . Urinary, incontinence, stress female     wears Pessary  1. CAD: s/p CABG 1997. Echo (12/13) with EF 60-65%, no significant valvular abnormalities, normal RV size and systolic function. Lexiscan Cardiolite (8/15) with EF 72%, no ischemia or infarction.  2. Paroxysmal atrial fibrillation noted since 11/13. Very symptomatic. She, of note, has a history of TIA in 2008. Was on dronedarone but stopped due to doughnut hole and now on amiodarone.  3. HTN: edema with amlodipine.  4. Hyperlipidemia: Unable to tolerate statins due to myalgias. 5. Migraines 6. GERD 7. IBS 8. Low back pain   Past Surgical History  Procedure Laterality Date  . Abdominal hysterectomy  1973  . Coronary artery bypass graft  1997    Dr Melvenia Needles  . Coronary angioplasty  1997    Dr Lia Foyer  . Laparoscopic cholecystectomy  01/03/2015  . Cataract extraction w/ intraocular lens  implant, bilateral Bilateral 1999  . Tonsillectomy    . Cholecystectomy N/A 01/03/2015    Procedure: LAPAROSCOPIC CHOLECYSTECTOMY WITH INTRAOPERATIVE CHOLANGIOGRAM;  Surgeon: Donnie Mesa, MD;  Location: [facility];  Service: General;  Laterality: N/A;    Current Medications: Outpatient Prescriptions Prior to Visit  Medication Sig Dispense Refill  . acetaminophen (TYLENOL) 500 MG tablet Take 500-1,000 mg by mouth every 6 (six) hours as needed for moderate pain.     Marland Kitchen bismuth subsalicylate (PEPTO BISMOL) 262 MG/15ML suspension Take 30 mLs by mouth every 6 (six) hours as needed for indigestion.    Marland Kitchen estradiol (ESTRACE) 0.1 MG/GM vaginal cream Place 1 Applicatorful  vaginally 2 (two) times a week.    . fluticasone (FLONASE) 50 MCG/ACT nasal spray Place 2 sprays into both nostrils daily. 16 g 6  . losartan (COZAAR) 50 MG tablet Take 1 tablet (50 mg total) by mouth 2 (two) times daily. 60 tablet 3  . Misc. Devices (RING PESSARY) MISC Place vaginally.    . Multiple Vitamin (MULTIVITAMIN) tablet Take 1 tablet by mouth daily.      . niacin 500 MG CR capsule Take 500 mg by mouth 2 (two) times daily.     . promethazine (PHENERGAN) 25 MG tablet Take 25 mg by mouth every 6 (six) hours as needed for nausea or vomiting.    . Tetrahydrozoline HCl (VISINE OP) Place 2 drops into both eyes as needed (for dry eyes).    Marland Kitchen amiodarone (PACERONE) 200 MG tablet TAKE ONE-HALF TABLET DAILY 15 tablet 2  . ELIQUIS 5 MG TABS tablet TAKE ONE TABLET TWICE DAILY 30 tablet 0  . LORazepam (ATIVAN) 1 MG tablet TAKE ONE TABLET EVERY EIGHT HOURS AS NEEDED FOR ANXIETY 90 tablet 0  . meclizine (ANTIVERT) 12.5 MG tablet TAKE ONE OR TWO TABLETS EVERY EIGHT HOURS AS NEEDED FOR DIZZINESS 90 tablet 1  . metoprolol succinate (TOPROL-XL) 25 MG 24 hr tablet TAKE ONE-HALF TABLET DAILY 15 tablet 5  . pantoprazole (PROTONIX) 40 MG tablet TAKE ONE TABLET TWICE DAILY 180 tablet 2  . zolpidem (AMBIEN) 10 MG tablet ONE TABLET AT BEDTIME FOR REST 30 tablet 5   No facility-administered medications prior to visit.     Allergies:   Asa; Hydrocodone; Ibuprofen; Oxycodone hcl; Penicillins; Prednisone; Statins; and Pristiq   Social History   Social History  . Marital Status: Widowed    Spouse Name: N/A  . Number of Children: N/A  . Years of Education: N/A   Social History Main Topics  . Smoking status: Never Smoker   . Smokeless tobacco: Never Used  . Alcohol Use: No  . Drug Use: No  . Sexual Activity: No   Other Topics Concern  . None   Social History Narrative     Family History:  The patient's family history includes Coronary artery disease in her sister; Diabetes in her sister; Heart  attack in her mother; Heart disease in her sister; Heart failure in her father; Throat cancer in her sister. There is no history of Colon cancer.   ROS:   Please see the history of present illness.    Review of Systems  Musculoskeletal: Positive for myalgias.  Gastrointestinal: Positive for diarrhea.  Neurological: Positive for dizziness and loss of balance.  Psychiatric/Behavioral: The patient is nervous/anxious.    All other systems reviewed and are negative.   Physical Exam:  VS:  BP 120/52 mmHg  Pulse 52  Ht 5\' 8"  (1.727 m)  Wt 143 lb 12.8 oz (65.227 kg)  BMI 21.87 kg/m2   GEN: Well nourished, well developed, in no acute distress HEENT: normal Neck: no JVD, no masses Cardiac: Normal S1/S2,  RRR; no murmurs, rubs, or gallops, no edema    Respiratory:  clear to auscultation bilaterally; no wheezing, rhonchi or rales GI: soft, nontender, nondistended MS: no deformity or atrophy Skin: warm and dry Neuro: No focal deficits  Psych: Alert and oriented x 3, normal affect  Wt Readings from Last 3 Encounters:  08/10/15 143 lb 12.8 oz (65.227 kg)  06/21/15 142 lb 3.2 oz (64.501 kg)  06/07/15 142 lb (64.411 kg)      Studies/Labs Reviewed:     EKG:  EKG is   ordered today.  The ekg ordered today demonstrates Sinus brady, HR 50, normal axis, QTc 457 ms, no changes   Recent Labs: 12/02/2014: TSH 1.85 01/05/2015: Hemoglobin 11.9* 03/09/2015: Platelets 239 07/05/2015: ALT 13; BUN 12; Creatinine, Ser 0.71; Potassium 4.0; Sodium 140   Recent Lipid Panel    Component Value Date/Time   CHOL 209* 07/05/2015 0918   CHOL 174 12/16/2012 1208   TRIG 120 07/05/2015 0918   HDL 61 07/05/2015 0918   HDL 47.90 12/16/2012 1208   CHOLHDL 3.4 07/05/2015 0918   CHOLHDL 4 12/16/2012 1208   VLDL 28.4 12/16/2012 1208   LDLCALC 124* 07/05/2015 0918   LDLCALC 98 12/16/2012 1208    Additional studies/ records that were reviewed today include:   Carotid US 3/17 Bilat ICA 1-39% >> FU  prn  Myoview 11/30/13 Normal stress nuclear study LV Ejection Fraction: 72%. LV Wall Motion: NL LV Function; NL Wall Motion  Carotid US 06/30/13 bilat 1-39% ICA FU 2 years  Echo 04/03/12 Mild LVH, EF 60-65%, normal wall motion, grade 2 diastolic dysfunction, trivial AI, trivial MR, mild LAE, normal RV function, mild to moderate TR, PASP 37 mmHg (borderline pulmonary hypertension)  Event Monitor 11/2008 Normal    ASSESSMENT:     1. Coronary artery disease involving native coronary artery of native heart without angina pectoris   2. Paroxysmal atrial fibrillation (HCC)   3. Essential hypertension   4. Hyperlipidemia LDL goal <100   5. Carotid stenosis, bilateral   6. On amiodarone therapy   7. Other fatigue     PLAN:     In order of problems listed above:  1. CAD - No angina.  Not on ASA as she is on Eliquis.  She is intol to statins.   2. PAF - Maintaining NSR.  CHADS2-VASc=6 (age, female, CVA, vascular dz).  Creatinine Cl (C-G equation):  57.83 mL/min.  Anticoagulation:  Apixaban (SCr < 1.5, weight > 60 kg) 5 mg bid.  Rhythm agent - Amiodarone. Recent Creatinine 0.71.   -  Check CBC.  3. HTN - BP controlled.    4. HL - Intol to statins.  Managed by PCP.   5. Carotid stenosis - Carotid US last month with mild bilat plaque.  PRN f/u rec.  6. Amiodarone Rx - She remains on Amio 100 QD.  -  LFTs, TSH today  -  Patient knows to get annual eye exams  -  Does not look like she ever got baseline PFTs in 2014.  CXR in 7/16 was ok.  7. Fatigue - She is bradycardic.  HR has been in the 50s in the past.  She is 82 yo and has  fallen.  We do not have to keep her on a beta-blocker.  There are beta-blocker properties with Amiodarone.  Will check TSH, CBC first.  If these are ok, I would rec DC Metoprolol Succinate.     Medication Adjustments/Labs and Tests Ordered: Current medicines are reviewed at length with the patient today.  Concerns regarding medicines are outlined above.   Medication changes, Labs and Tests ordered today are outlined in the Patient Instructions noted below. Patient Instructions  Medication Instructions:  No changes today.  See your medication list. Labwork: Today - LFTs, TSH, CBC Testing/Procedures: None  Follow-Up: Dr. Loralie Champagne in 6 months.  Any Other Special Instructions Will Be Listed Below (If Applicable). If you need a refill on your cardiac medications before your next appointment, please call your pharmacy.    Signed, Richardson Dopp, PA-C  08/10/2015 3:38 PM    [facility] [address], [place], [place]  [address] Phone: [phone]; Fax: [phone]

## 2015-08-10 NOTE — Patient Instructions (Addendum)
Medication Instructions:  No changes today.  See your medication list. Labwork: Today - LFTs, TSH, CBC Testing/Procedures: None  Follow-Up: Dr. Loralie Champagne in 6 months.  Any Other Special Instructions Will Be Listed Below (If Applicable). If you need a refill on your cardiac medications before your next appointment, please call your pharmacy.

## 2015-08-11 ENCOUNTER — Telehealth: Payer: Self-pay | Admitting: *Deleted

## 2015-08-11 NOTE — Telephone Encounter (Signed)
Pt has been notified of lab results. Pt aware sine lab work normal to d/c metoprolol tartrate to see if this helps with her fatigue. I advised pt to call back if not better after beign off the metoprolol 2 weeks. Pt said ok and thank you.

## 2015-08-14 ENCOUNTER — Telehealth: Payer: Self-pay | Admitting: *Deleted

## 2015-08-14 NOTE — Telephone Encounter (Signed)
Patient left a message on the refill voicemail requesting a call from you at [phone]. I attempted to call her to see if it was something that I could assist her with, but I did not get an answer and no voicemail. Please advise. Thanks, MI

## 2015-08-15 NOTE — Telephone Encounter (Signed)
I called and s/w pt who states she is not sleeping at night and though maybe stopping the metoprolol was the problem. I stated we stopped the metoprolol because she was fatigued. Pt said PCP gave her Ambien and she is not sure if the dose is strong enough.  Advised for pt to f/u w/PCP in regards to Ambien, pt agreeable. I asked pt did she want to try again the metoprolol 12.5 mg QHS; pt said yes. I asked pt to call me 4/26 to let me know how she has been doing back on the metoprolol. Pt agreeable to plan of care in all aspects.

## 2015-08-23 ENCOUNTER — Encounter: Payer: Self-pay | Admitting: Internal Medicine

## 2015-08-23 ENCOUNTER — Ambulatory Visit (INDEPENDENT_AMBULATORY_CARE_PROVIDER_SITE_OTHER): Payer: Medicare Other | Admitting: Internal Medicine

## 2015-08-23 VITALS — BP 130/72 | HR 54 | Temp 98.4°F | Resp 20 | Ht 68.0 in | Wt 143.6 lb

## 2015-08-23 DIAGNOSIS — R1011 Right upper quadrant pain: Secondary | ICD-10-CM | POA: Diagnosis not present

## 2015-08-23 DIAGNOSIS — Z9049 Acquired absence of other specified parts of digestive tract: Secondary | ICD-10-CM | POA: Diagnosis not present

## 2015-08-23 DIAGNOSIS — I6523 Occlusion and stenosis of bilateral carotid arteries: Secondary | ICD-10-CM

## 2015-08-23 DIAGNOSIS — R194 Change in bowel habit: Secondary | ICD-10-CM

## 2015-08-23 NOTE — Progress Notes (Signed)
Patient ID: Marissa Bowen, female   DOB: Jul 02, 1926, 82 y.o.   MRN: [id number]    Location:    [facility]   Place of Service:   OFFICE  Chief Complaint  Patient presents with  . Acute Visit    Patient c/o Stools are black and loose has been off and on  . OTHER    Labs printed    HPI:  82 yo female seen today for change in stool. She reports that bacon, sausage and possibly hamburger. No bloody stools. No diarrhea or loose stools. No f/c. Some times she has abdominal pain in midepigastric region. No N/V. She is c/a chocolate causing it as she loves to consume it. Sx's present since her lap chole in Sept 2016, but she 1st told me about bowel issues in Feb 2017. She has increased stressors at home   Depression/grief reaction - improving. has tried 4 meds (lexapro, zoloft, remeron and trazodone) in the past and unable to tolerate ADRs. No anaphylaxis to meds tried. She misses her deceased spouse but overall mood is improved. Some short term memory issues. Takes prn lorazepam and prn ambien. She has difficulty staying asleep.  HTN/CAD/hyperlipidemia/PAF - rate controlled on metoprolol and amiodarone. She takes losartan for BP. Taking eliquis for anticoagulation. On niacin for cholesterol  GERD - stable on protonix. Occasional diarrhea. Takes pepto bismul prn  Migraine - stable. No exacerbations since last OV  Hx CVA - no new sx's. Takes eliquis and niacin  She also takes HRT - estrace cream Past Medical History  Diagnosis Date  . BACK PAIN, LUMBAR 09/27/2009  . CAD (coronary artery disease)     a. s/p remote CABG 1997, 3V.;  b. LexiScan Myoview (8/15): No ischemia, EF 72%, normal study;  c. Echo 12/13 - EF 60-65%, no significant valvular abnormalities, normal RV size and systolic function.   Marland Kitchen PAF (paroxysmal atrial fibrillation) ([place]) 11/25/2006    a. Multaq >> changes to Amiodarone 2/2 $$  //  Eliquis (CHADS2-VASc = 6)  . GERD 11/25/2006  . HYPERLIPIDEMIA   . HYPERTENSION   . IBS 12/07/2008    . MIGRAINE WITH AURA 08/13/2007  . Muscle weakness (generalized) 09/27/2009  . OSTEOARTHRITIS 08/13/2007  . Hiatal hernia   . History of diverticulitis Dx 02/2012  . Anxiety   . CVA (cerebral infarction)     a. Thalamic infarct 09/2009. //  b. hx of TIA in 2008  . Anemia   . GI bleed     a. 09/2009: secondary to antral ulcer.  . Mild carotid artery disease ([place])     a. 0-39% bilat 05/2011 (f/u recommended 05/2013). //  b. Carotid US 3/17 - Bilat ICA 1-39% >> FU prn  . Gastric ulcer   . History of MI (myocardial infarction)   . Urinary, incontinence, stress female     wears Pessary    Past Surgical History  Procedure Laterality Date  . Abdominal hysterectomy  1973  . Coronary artery bypass graft  1997    Dr Melvenia Needles  . Coronary angioplasty  1997    Dr Lia Foyer  . Laparoscopic cholecystectomy  01/03/2015  . Cataract extraction w/ intraocular lens  implant, bilateral Bilateral 1999  . Tonsillectomy    . Cholecystectomy N/A 01/03/2015    Procedure: LAPAROSCOPIC CHOLECYSTECTOMY WITH INTRAOPERATIVE CHOLANGIOGRAM;  Surgeon: Donnie Mesa, MD;  Location: [facility];  Service: General;  Laterality: N/A;    Patient Care Team: Gildardo Cranker, DO as PCP - General (Internal Medicine) Loretha Brasil  Lia Foyer, MD (Cardiology) Melissa Montane, MD as Consulting Physician (Otolaryngology) Arvella Nigh, MD as Consulting Physician (Obstetrics and Gynecology) Rutherford Guys, MD as Consulting Physician (Ophthalmology)  Social History   Social History  . Marital Status: Widowed    Spouse Name: N/A  . Number of Children: N/A  . Years of Education: N/A   Occupational History  . Not on file.   Social History Main Topics  . Smoking status: Never Smoker   . Smokeless tobacco: Never Used  . Alcohol Use: No  . Drug Use: No  . Sexual Activity: No   Other Topics Concern  . Not on file   Social History Narrative     reports that she has never smoked. She has never used smokeless tobacco. She reports that she does not  drink alcohol or use illicit drugs.  Allergies  Allergen Reactions  . Asa [Aspirin] Other (See Comments)    Bleeding ulcer  . Hydrocodone Other (See Comments)    REACTION: migraines  . Ibuprofen Other (See Comments)    ulcers  . Oxycodone Hcl Other (See Comments)    REACTION: migraines  . Penicillins Swelling and Rash  . Prednisone Other (See Comments)    ulcers  . Statins Other (See Comments)    Aches and pains  . Pristiq [Desvenlafaxine] Other (See Comments)    Drowsiness and hangover sensation    Medications: Patient's Medications  New Prescriptions   No medications on file  Previous Medications   ACETAMINOPHEN (TYLENOL) 500 MG TABLET    Take 500-1,000 mg by mouth every 6 (six) hours as needed for moderate pain.    AMIODARONE (PACERONE) 200 MG TABLET    TAKE ONE HALF TABLET BY MOUTH DAILY   APIXABAN (ELIQUIS) 5 MG TABS TABLET    Take 5 mg by mouth 2 (two) times daily.   BISMUTH SUBSALICYLATE (PEPTO BISMOL) 262 MG/15ML SUSPENSION    Take 30 mLs by mouth every 6 (six) hours as needed for indigestion.   ESTRADIOL (ESTRACE) 0.1 MG/GM VAGINAL CREAM    Place 1 Applicatorful vaginally 2 (two) times a week.   FLUTICASONE (FLONASE) 50 MCG/ACT NASAL SPRAY    Place 2 sprays into both nostrils daily.   LORAZEPAM (ATIVAN) 1 MG TABLET    Take 1 mg by mouth every 8 (eight) hours as needed for anxiety.   LOSARTAN (COZAAR) 50 MG TABLET    Take 1 tablet (50 mg total) by mouth 2 (two) times daily.   MECLIZINE (ANTIVERT) 12.5 MG TABLET    Take 12.5 mg by mouth 3 (three) times daily as needed for dizziness.   METOPROLOL TARTRATE (LOPRESSOR) 25 MG TABLET    TAKE ONE HALF TABLET BY MOUTH DAILY   MISC. DEVICES (RING PESSARY) MISC    Place vaginally.   MULTIPLE VITAMIN (MULTIVITAMIN) TABLET    Take 1 tablet by mouth daily.     NIACIN 500 MG CR CAPSULE    Take 500 mg by mouth 2 (two) times daily.    PANTOPRAZOLE (PROTONIX) 40 MG TABLET    Take 40 mg by mouth daily.   TETRAHYDROZOLINE HCL (VISINE OP)     Place 2 drops into both eyes as needed (for dry eyes).   ZOLPIDEM (AMBIEN) 10 MG TABLET    Take 10 mg by mouth at bedtime as needed for sleep.  Modified Medications   No medications on file  Discontinued Medications   PROMETHAZINE (PHENERGAN) 25 MG TABLET    Take 25 mg by mouth every 6 (  six) hours as needed for nausea or vomiting.    Review of Systems  Constitutional: Positive for fatigue.  Gastrointestinal: Positive for abdominal pain.  Psychiatric/Behavioral: The patient is nervous/anxious.   All other systems reviewed and are negative.   Filed Vitals:   08/23/15 1543  BP: 130/72  Pulse: 54  Temp: 98.4 F (36.9 C)  TempSrc: Oral  Resp: 20  Height: _0  (1.727 m)  Weight: 143 lb 9.6 oz (65.137 kg)  SpO2: 97%   Body mass index is 21.84 kg/(m^2).  Physical Exam  Constitutional: She appears well-developed and well-nourished.  HENT:  Mouth/Throat: Oropharynx is clear and moist. No oropharyngeal exudate.  Eyes: Pupils are equal, round, and reactive to light. No scleral icterus.  Neck: Neck supple. Carotid bruit is not present. No tracheal deviation present. No thyromegaly present.  Cardiovascular: Normal rate, regular rhythm and intact distal pulses.  Exam reveals no gallop and no friction rub.   Murmur (1/6 SEM) heard. No LE edema b/l. no calf TTP.   Pulmonary/Chest: Effort normal and breath sounds normal. No stridor. No respiratory distress. She has no wheezes. She has no rales.  Abdominal: Soft. Bowel sounds are normal. She exhibits no distension and no mass. There is no hepatomegaly. There is tenderness (RUQ). There is no rebound and no guarding.  Musculoskeletal: She exhibits edema.  Lymphadenopathy:    She has no cervical adenopathy.  Neurological: She is alert.  Skin: Skin is warm and dry. No rash noted.  Psychiatric: She has a normal mood and affect. Her behavior is normal.     Labs reviewed: Office Visit on 08/10/2015  Component Date Value Ref Range Status    . Total Bilirubin 08/10/2015 0.4  0.2 - 1.2 mg/dL Final  . Bilirubin, Direct 08/10/2015 0.1  <=0.2 mg/dL Final  . Indirect Bilirubin 08/10/2015 0.3  0.2 - 1.2 mg/dL Final  . Alkaline Phosphatase 08/10/2015 79  33 - 130 U/L Final  . AST 08/10/2015 15  10 - 35 U/L Final  . ALT 08/10/2015 13  6 - 29 U/L Final  . Total Protein 08/10/2015 6.9  6.1 - 8.1 g/dL Final  . Albumin 08/10/2015 4.1  3.6 - 5.1 g/dL Final  . TSH 08/10/2015 1.85   Final   Comment:   Reference Range   > or = 20 Years  0.40-4.50   Pregnancy Range First trimester  0.26-2.66 Second trimester 0.55-2.73 Third trimester  0.43-2.91     . WBC 08/10/2015 6.2  3.8 - 10.8 K/uL Final  . RBC 08/10/2015 4.05  3.80 - 5.10 MIL/uL Final  . Hemoglobin 08/10/2015 12.3  11.7 - 15.5 g/dL Final  . HCT 08/10/2015 36.8  35.0 - 45.0 % Final  . MCV 08/10/2015 90.9  80.0 - 100.0 fL Final  . MCH 08/10/2015 30.4  27.0 - 33.0 pg Final  . MCHC 08/10/2015 33.4  32.0 - 36.0 g/dL Final  . RDW 08/10/2015 13.2  11.0 - 15.0 % Final  . Platelets 08/10/2015 229  140 - 400 K/uL Final  . MPV 08/10/2015 9.7  7.5 - 12.5 fL Final  . Neutro Abs 08/10/2015 3472  1500 - 7800 cells/uL Final  . Lymphs Abs 08/10/2015 1674  850 - 3900 cells/uL Final  . Monocytes Absolute 08/10/2015 744  200 - 950 cells/uL Final  . Eosinophils Absolute 08/10/2015 248  15 - 500 cells/uL Final  . Basophils Absolute 08/10/2015 62  0 - 200 cells/uL Final  . Neutrophils Relative % 08/10/2015 56   Final  .  Lymphocytes Relative 08/10/2015 27   Final  . Monocytes Relative 08/10/2015 12   Final  . Eosinophils Relative 08/10/2015 4   Final  . Basophils Relative 08/10/2015 1   Final  . Smear Review 08/10/2015 Criteria for review not met   Final   ** Please note change in unit of measure and reference range(s). **  Appointment on 07/05/2015  Component Date Value Ref Range Status  . Glucose 07/05/2015 82  65 - 99 mg/dL Final  . BUN 07/05/2015 12  8 - 27 mg/dL Final  . Creatinine, Ser  07/05/2015 0.71  0.57 - 1.00 mg/dL Final  . GFR calc non Af Amer 07/05/2015 77  >59 mL/min/1.73 Final  . GFR calc Af Amer 07/05/2015 89  >59 mL/min/1.73 Final  . BUN/Creatinine Ratio 07/05/2015 17  11 - 26 Final  . Sodium 07/05/2015 140  134 - 144 mmol/L Final  . Potassium 07/05/2015 4.0  3.5 - 5.2 mmol/L Final  . Chloride 07/05/2015 97  96 - 106 mmol/L Final  . CO2 07/05/2015 27  18 - 29 mmol/L Final  . Calcium 07/05/2015 9.0  8.7 - 10.3 mg/dL Final  . ALT 07/05/2015 13  0 - 32 IU/L Final  . Cholesterol, Total 07/05/2015 209* 100 - 199 mg/dL Final  . Triglycerides 07/05/2015 120  0 - 149 mg/dL Final  . HDL 07/05/2015 61  >39 mg/dL Final  . VLDL Cholesterol Cal 07/05/2015 24  5 - 40 mg/dL Final  . LDL Calculated 07/05/2015 124* 0 - 99 mg/dL Final  . Chol/HDL Ratio 07/05/2015 3.4  0.0 - 4.4 ratio units Final   Comment:                                   T. Chol/HDL Ratio                                             Men  Women                               1/2 Avg.Risk  3.4    3.3                                   Avg.Risk  5.0    4.4                                2X Avg.Risk  9.6    7.1                                3X Avg.Risk 23.4   11.0     No results found.   Assessment/Plan   ICD-9-CM ICD-10-CM   1. RUQ abdominal pain 789.01 R10.11 US Abdomen Complete  2. S/P laparoscopic cholecystectomy V45.89 Z90.49 US Abdomen Complete  3. Change in bowel habits 787.99 R19.4 US Abdomen Complete   Avoid fatty foods  Continue current medications as ordered  Will call with abdominal US appt   Follow up as scheduled  Harlon Kutner S. Eulas Post, D. O., F. [place]  [facility] [address] [place], [place] [address] [phone] Cell (Monday-Friday 8 AM - 5 PM) [phone] After 5 PM and follow prompts

## 2015-08-23 NOTE — Patient Instructions (Signed)
Avoid fatty foods  Continue current medications as ordered  Will call with abdominal US appt   Follow up as scheduled

## 2015-08-29 ENCOUNTER — Other Ambulatory Visit: Payer: Self-pay

## 2015-08-29 MED ORDER — APIXABAN 5 MG PO TABS: 5.0000 mg | ORAL_TABLET | Freq: Two times a day (BID) | ORAL | Status: DC

## 2015-08-29 NOTE — Telephone Encounter (Signed)
Liliane Shi, PA-C at 12/01/2014 9:32 PM  ELIQUIS 5 MG TABS tabletTAKE ONE TABLET TWICE DAILY  Patient Instructions     Medication Instructions:  Same-no change

## 2015-09-01 ENCOUNTER — Ambulatory Visit
Admission: RE | Admit: 2015-09-01 | Discharge: 2015-09-01 | Disposition: A | Discharge status: Home / Self Care | Payer: Medicare Other | Source: Ambulatory Visit | Attending: Internal Medicine | Admitting: Internal Medicine

## 2015-09-01 ENCOUNTER — Other Ambulatory Visit: Payer: Medicare Other

## 2015-09-01 DIAGNOSIS — R1011 Right upper quadrant pain: Secondary | ICD-10-CM | POA: Diagnosis not present

## 2015-09-01 DIAGNOSIS — Z9049 Acquired absence of other specified parts of digestive tract: Secondary | ICD-10-CM

## 2015-09-01 DIAGNOSIS — R194 Change in bowel habit: Secondary | ICD-10-CM

## 2015-09-02 ENCOUNTER — Other Ambulatory Visit: Payer: Self-pay | Admitting: Internal Medicine

## 2015-09-04 ENCOUNTER — Other Ambulatory Visit: Payer: Self-pay

## 2015-09-04 DIAGNOSIS — Z1211 Encounter for screening for malignant neoplasm of colon: Secondary | ICD-10-CM

## 2015-09-04 DIAGNOSIS — H353131 Nonexudative age-related macular degeneration, bilateral, early dry stage: Secondary | ICD-10-CM | POA: Diagnosis not present

## 2015-09-04 DIAGNOSIS — Z961 Presence of intraocular lens: Secondary | ICD-10-CM | POA: Diagnosis not present

## 2015-09-18 ENCOUNTER — Other Ambulatory Visit: Payer: Self-pay | Admitting: Internal Medicine

## 2015-09-18 ENCOUNTER — Other Ambulatory Visit: Payer: Self-pay | Admitting: *Deleted

## 2015-09-18 ENCOUNTER — Other Ambulatory Visit: Payer: Medicare Other

## 2015-09-18 DIAGNOSIS — Z1211 Encounter for screening for malignant neoplasm of colon: Secondary | ICD-10-CM

## 2015-09-20 LAB — PLEASE NOTE

## 2015-09-20 LAB — FECAL OCCULT BLOOD, IMMUNOCHEMICAL: Fecal Occult Bld: NEGATIVE

## 2015-09-22 ENCOUNTER — Ambulatory Visit (INDEPENDENT_AMBULATORY_CARE_PROVIDER_SITE_OTHER): Payer: Medicare Other | Admitting: Internal Medicine

## 2015-09-22 ENCOUNTER — Encounter: Payer: Self-pay | Admitting: Internal Medicine

## 2015-09-22 VITALS — BP 142/68 | HR 60 | Temp 98.0°F | Ht 68.0 in | Wt 149.8 lb

## 2015-09-22 DIAGNOSIS — I6523 Occlusion and stenosis of bilateral carotid arteries: Secondary | ICD-10-CM

## 2015-09-22 DIAGNOSIS — I480 Paroxysmal atrial fibrillation: Secondary | ICD-10-CM | POA: Diagnosis not present

## 2015-09-22 DIAGNOSIS — M150 Primary generalized (osteo)arthritis: Secondary | ICD-10-CM

## 2015-09-22 DIAGNOSIS — E785 Hyperlipidemia, unspecified: Secondary | ICD-10-CM | POA: Diagnosis not present

## 2015-09-22 DIAGNOSIS — F418 Other specified anxiety disorders: Secondary | ICD-10-CM

## 2015-09-22 DIAGNOSIS — K219 Gastro-esophageal reflux disease without esophagitis: Secondary | ICD-10-CM

## 2015-09-22 DIAGNOSIS — M25472 Effusion, left ankle: Secondary | ICD-10-CM

## 2015-09-22 DIAGNOSIS — M159 Polyosteoarthritis, unspecified: Secondary | ICD-10-CM

## 2015-09-22 DIAGNOSIS — H3530 Unspecified macular degeneration: Secondary | ICD-10-CM | POA: Diagnosis not present

## 2015-09-22 DIAGNOSIS — I10 Essential (primary) hypertension: Secondary | ICD-10-CM

## 2015-09-22 NOTE — Progress Notes (Signed)
Patient ID: Marissa Bowen, female   DOB: [birth year], 82 y.o.   MRN: [id number]    Location:  [facility] Place of Service: OFFICE  Chief Complaint  Patient presents with  . Medical Management of Chronic Issues    3 month Follow up    HPI:  82 yo female seen today for f/u. She saw her eye specialist Dr Gershon Crane and was dx with macular degeneration L>R. She was Rx preservision gtts. She noticed left ankle swelling x 1 week. No known trauma. She is a poor historian due to memory loss. Hx obtained from chart  Depression/grief reaction - improving. has tried 4 meds (lexapro, zoloft, remeron and trazodone) in the past and unable to tolerate ADRs. No anaphylaxis to meds tried. She misses her deceased spouse but overall mood is improved. Some short term memory issues. Takes prn lorazepam and prn ambien. She has difficulty staying asleep.   HTN/CAD/hyperlipidemia/PAF - rate controlled on metoprolol and amiodarone. She takes losartan for BP. Taking eliquis for anticoagulation. On niacin for cholesterol. Followed by cardiology  GERD - stable on protonix. Occasional diarrhea. Takes pepto bismul prn  Migraine - stable. No exacerbations since last OV  Hx CVA - no new sx's. Takes eliquis and niacin  She also takes HRT - estrace cream. Followed by GYN  Prolapsed bladder - followed by GYN and has a pessary. She c/o urinary leakage that requires her to wear panty liners  Past Medical History  Diagnosis Date  . BACK PAIN, LUMBAR 09/27/2009  . CAD (coronary artery disease)     a. s/p remote CABG 1997, 3V.;  b. LexiScan Myoview (8/15): No ischemia, EF 72%, normal study;  c. Echo 12/13 - EF 60-65%, no significant valvular abnormalities, normal RV size and systolic function.   Marland Kitchen PAF (paroxysmal atrial fibrillation) ([place]) 11/25/2006    a. Multaq >> changes to Amiodarone 2/2 $$  //  Eliquis (CHADS2-VASc = 6)  . GERD 11/25/2006  . HYPERLIPIDEMIA   . HYPERTENSION   . IBS 12/07/2008  . MIGRAINE WITH AURA 08/13/2007  .  Muscle weakness (generalized) 09/27/2009  . OSTEOARTHRITIS 08/13/2007  . Hiatal hernia   . History of diverticulitis Dx 02/2012  . Anxiety   . CVA (cerebral infarction)     a. Thalamic infarct 09/2009. //  b. hx of TIA in 2008  . Anemia   . GI bleed     a. 09/2009: secondary to antral ulcer.  . Mild carotid artery disease ([place])     a. 0-39% bilat 05/2011 (f/u recommended 05/2013). //  b. Carotid US 3/17 - Bilat ICA 1-39% >> FU prn  . Gastric ulcer   . History of MI (myocardial infarction)   . Urinary, incontinence, stress female     wears Pessary    Past Surgical History  Procedure Laterality Date  . Abdominal hysterectomy  1973  . Coronary artery bypass graft  1997    Dr Melvenia Needles  . Coronary angioplasty  1997    Dr Lia Foyer  . Laparoscopic cholecystectomy  01/03/2015  . Cataract extraction w/ intraocular lens  implant, bilateral Bilateral 1999  . Tonsillectomy    . Cholecystectomy N/A 01/03/2015    Procedure: LAPAROSCOPIC CHOLECYSTECTOMY WITH INTRAOPERATIVE CHOLANGIOGRAM;  Surgeon: Donnie Mesa, MD;  Location: [facility];  Service: General;  Laterality: N/A;    Patient Care Team: Gildardo Cranker, DO as PCP - General (Internal Medicine) Hillary Bow, MD (Cardiology) Melissa Montane, MD as Consulting Physician (Otolaryngology) Arvella Nigh, MD as Consulting Physician (Obstetrics  and Gynecology) Rutherford Guys, MD as Consulting Physician (Ophthalmology)  Social History   Social History  . Marital Status: Widowed    Spouse Name: N/A  . Number of Children: N/A  . Years of Education: N/A   Occupational History  . Not on file.   Social History Main Topics  . Smoking status: Never Smoker   . Smokeless tobacco: Never Used  . Alcohol Use: No  . Drug Use: No  . Sexual Activity: No   Other Topics Concern  . Not on file   Social History Narrative     reports that she has never smoked. She has never used smokeless tobacco. She reports that she does not drink alcohol or use illicit  drugs.  Allergies  Allergen Reactions  . Asa [Aspirin] Other (See Comments)    Bleeding ulcer  . Hydrocodone Other (See Comments)    REACTION: migraines  . Ibuprofen Other (See Comments)    ulcers  . Oxycodone Hcl Other (See Comments)    REACTION: migraines  . Penicillins Swelling and Rash  . Prednisone Other (See Comments)    ulcers  . Statins Other (See Comments)    Aches and pains  . Pristiq [Desvenlafaxine] Other (See Comments)    Drowsiness and hangover sensation    Medications: Patient's Medications  New Prescriptions   No medications on file  Previous Medications   ACETAMINOPHEN (TYLENOL) 500 MG TABLET    Take 500-1,000 mg by mouth every 6 (six) hours as needed for moderate pain.    AMIODARONE (PACERONE) 200 MG TABLET    TAKE ONE HALF TABLET BY MOUTH DAILY   APIXABAN (ELIQUIS) 5 MG TABS TABLET    Take 1 tablet (5 mg total) by mouth 2 (two) times daily.   BISMUTH SUBSALICYLATE (PEPTO BISMOL) 262 MG/15ML SUSPENSION    Take 30 mLs by mouth every 6 (six) hours as needed for indigestion.   ESTRADIOL (ESTRACE) 0.1 MG/GM VAGINAL CREAM    Place 1 Applicatorful vaginally 2 (two) times a week.   FLUTICASONE (FLONASE) 50 MCG/ACT NASAL SPRAY    Place 2 sprays into both nostrils daily.   LORAZEPAM (ATIVAN) 1 MG TABLET    TAKE ONE TABLET EVERY EIGHT HOURS AS NEEDED FOR ANXIETY   LOSARTAN (COZAAR) 50 MG TABLET    Take 1 tablet (50 mg total) by mouth 2 (two) times daily.   MECLIZINE (ANTIVERT) 12.5 MG TABLET    Take 12.5 mg by mouth 3 (three) times daily as needed for dizziness.   MECLIZINE (ANTIVERT) 12.5 MG TABLET    TAKE ONE OR TWO TABLETS EVERY EIGHT HOURS AS NEEDED FOR DIZZINESS   METOPROLOL TARTRATE (LOPRESSOR) 25 MG TABLET    TAKE ONE HALF TABLET BY MOUTH DAILY   MISC. DEVICES (RING PESSARY) MISC    Place vaginally.   MULTIPLE VITAMIN (MULTIVITAMIN) TABLET    Take 1 tablet by mouth daily.     MULTIPLE VITAMINS-MINERALS (PRESERVISION AREDS) TABS    Take 1 tab by mouth in the  morning, 1 tablet by mouth in the evening   NIACIN 500 MG CR CAPSULE    Take 500 mg by mouth 2 (two) times daily.    PANTOPRAZOLE (PROTONIX) 40 MG TABLET    Take 40 mg by mouth daily.   TETRAHYDROZOLINE HCL (VISINE OP)    Place 2 drops into both eyes as needed (for dry eyes).   ZOLPIDEM (AMBIEN) 10 MG TABLET    Take 10 mg by mouth at bedtime as needed for  sleep.  Modified Medications   No medications on file  Discontinued Medications   No medications on file    Review of Systems  Unable to perform ROS: Other  memory loss  Filed Vitals:   09/22/15 1417  BP: 142/68  Pulse: 60  Temp: 98 F (36.7 C)  TempSrc: Oral  Height: 5' 8"  (1.727 m)  Weight: 149 lb 12.8 oz (67.949 kg)  SpO2: 95%   Body mass index is 22.78 kg/(m^2).  Physical Exam  Constitutional: She appears well-developed and well-nourished.  HENT:  Mouth/Throat: Oropharynx is clear and moist. No oropharyngeal exudate.  Eyes: Pupils are equal, round, and reactive to light. No scleral icterus.  Neck: Neck supple. Carotid bruit is not present. No tracheal deviation present. No thyromegaly present.  Cardiovascular: Normal rate, regular rhythm and intact distal pulses.  Exam reveals no gallop and no friction rub.   Murmur (1/6 SEM) heard. No LE edema b/l. no calf TTP.   Pulmonary/Chest: Effort normal and breath sounds normal. No stridor. No respiratory distress. She has no wheezes. She has no rales.  Abdominal: Soft. Bowel sounds are normal. She exhibits distension. She exhibits no mass. There is no hepatomegaly. There is tenderness (epigastric). There is no rebound and no guarding.  Musculoskeletal: She exhibits edema and tenderness.  Left ankle swelling wit medial posterior ankle TTP and reduced eversion/inversion  Lymphadenopathy:    She has no cervical adenopathy.  Neurological: She is alert.  Skin: Skin is warm and dry. No rash noted.  Psychiatric: She has a normal mood and affect. Her behavior is normal.     Labs  reviewed: Orders Only on 09/18/2015  Component Date Value Ref Range Status  . Fecal Occult Bld 09/18/2015 Negative  Negative Final  . Please note 09/18/2015 Comment   Final   Comment: The date and/or time of collection was not indicated on the requisition as required by state and federal law.  The date of receipt of the specimen was used as the collection date if not supplied.   Office Visit on 08/10/2015  Component Date Value Ref Range Status  . Total Bilirubin 08/10/2015 0.4  0.2 - 1.2 mg/dL Final  . Bilirubin, Direct 08/10/2015 0.1  <=0.2 mg/dL Final  . Indirect Bilirubin 08/10/2015 0.3  0.2 - 1.2 mg/dL Final  . Alkaline Phosphatase 08/10/2015 79  33 - 130 U/L Final  . AST 08/10/2015 15  10 - 35 U/L Final  . ALT 08/10/2015 13  6 - 29 U/L Final  . Total Protein 08/10/2015 6.9  6.1 - 8.1 g/dL Final  . Albumin 08/10/2015 4.1  3.6 - 5.1 g/dL Final  . TSH 08/10/2015 1.85   Final   Comment:   Reference Range   > or = 20 Years  0.40-4.50   Pregnancy Range First trimester  0.26-2.66 Second trimester 0.55-2.73 Third trimester  0.43-2.91     . WBC 08/10/2015 6.2  3.8 - 10.8 K/uL Final  . RBC 08/10/2015 4.05  3.80 - 5.10 MIL/uL Final  . Hemoglobin 08/10/2015 12.3  11.7 - 15.5 g/dL Final  . HCT 08/10/2015 36.8  35.0 - 45.0 % Final  . MCV 08/10/2015 90.9  80.0 - 100.0 fL Final  . MCH 08/10/2015 30.4  27.0 - 33.0 pg Final  . MCHC 08/10/2015 33.4  32.0 - 36.0 g/dL Final  . RDW 08/10/2015 13.2  11.0 - 15.0 % Final  . Platelets 08/10/2015 229  140 - 400 K/uL Final  . MPV 08/10/2015 9.7  7.5 -  12.5 fL Final  . Neutro Abs 08/10/2015 3472  1500 - 7800 cells/uL Final  . Lymphs Abs 08/10/2015 1674  850 - 3900 cells/uL Final  . Monocytes Absolute 08/10/2015 744  200 - 950 cells/uL Final  . Eosinophils Absolute 08/10/2015 248  15 - 500 cells/uL Final  . Basophils Absolute 08/10/2015 62  0 - 200 cells/uL Final  . Neutrophils Relative % 08/10/2015 56   Final  . Lymphocytes Relative  08/10/2015 27   Final  . Monocytes Relative 08/10/2015 12   Final  . Eosinophils Relative 08/10/2015 4   Final  . Basophils Relative 08/10/2015 1   Final  . Smear Review 08/10/2015 Criteria for review not met   Final   ** Please note change in unit of measure and reference range(s). **  Appointment on 07/05/2015  Component Date Value Ref Range Status  . Glucose 07/05/2015 82  65 - 99 mg/dL Final  . BUN 07/05/2015 12  8 - 27 mg/dL Final  . Creatinine, Ser 07/05/2015 0.71  0.57 - 1.00 mg/dL Final  . GFR calc non Af Amer 07/05/2015 77  >59 mL/min/1.73 Final  . GFR calc Af Amer 07/05/2015 89  >59 mL/min/1.73 Final  . BUN/Creatinine Ratio 07/05/2015 17  11 - 26 Final  . Sodium 07/05/2015 140  134 - 144 mmol/L Final  . Potassium 07/05/2015 4.0  3.5 - 5.2 mmol/L Final  . Chloride 07/05/2015 97  96 - 106 mmol/L Final  . CO2 07/05/2015 27  18 - 29 mmol/L Final  . Calcium 07/05/2015 9.0  8.7 - 10.3 mg/dL Final  . ALT 07/05/2015 13  0 - 32 IU/L Final  . Cholesterol, Total 07/05/2015 209* 100 - 199 mg/dL Final  . Triglycerides 07/05/2015 120  0 - 149 mg/dL Final  . HDL 07/05/2015 61  >39 mg/dL Final  . VLDL Cholesterol Cal 07/05/2015 24  5 - 40 mg/dL Final  . LDL Calculated 07/05/2015 124* 0 - 99 mg/dL Final  . Chol/HDL Ratio 07/05/2015 3.4  0.0 - 4.4 ratio units Final   Comment:                                   T. Chol/HDL Ratio                                             Men  Women                               1/2 Avg.Risk  3.4    3.3                                   Avg.Risk  5.0    4.4                                2X Avg.Risk  9.6    7.1                                3X Avg.Risk 23.4   11.0     US Abdomen  Complete  09/01/2015  CLINICAL DATA:  Status post cholecystectomy with right upper quadrant pain EXAM: ABDOMEN ULTRASOUND COMPLETE COMPARISON:  11/11/2014 FINDINGS: Gallbladder: Surgically absent. Common bile duct: Diameter: 3 mm. Where visualized, no filling defect. Liver: No focal  lesion identified. Within normal limits in parenchymal echogenicity. IVC: No abnormality visualized. Pancreas: Visualized portion unremarkable. Spleen: Size and appearance within normal limits. Right Kidney: Length: 11 cm. No hydronephrosis. Normal echogenicity. Left Kidney: Length: 12 cm. 2.6 cm cyst with thin benign-appearing septation. No hydronephrosis. Abdominal aorta: Diffusely atherosclerotic with 27 mm maximal diameter. IMPRESSION: 1. No explanation for right upper quadrant pain. 2. Cholecystectomy with normal common bile duct diameter. Electronically Signed   By: Monte Fantasia M.D.   On: 09/01/2015 10:46     Assessment/Plan   ICD-9-CM ICD-10-CM   1. Macular degeneration of both eyes - new 362.50 H35.30   2. Primary osteoarthritis involving multiple joints 715.09 M15.0   3. Depression with anxiety 300.4 F41.8   4. Paroxysmal atrial fibrillation (H[facility]) 427.31 I48.0   5. Gastroesophageal reflux disease without esophagitis 530.81 K21.9   6. Essential hypertension 401.9 I10   7. Hyperlipidemia LDL goal <100 272.4 E78.5   8. Left ankle swelling - new 719.07 M25.472 DG Ankle Complete Left   Will check left ankle xray and call with results  Continue current medications as ordered  Follow up in 3 mos for routine visit. Fasting labs prior to visit  Follow up with specialists as scheduled  Lucyann Romano S. Perlie Gold  [facility] [address] [place], [place] [address] [phone] Cell (Monday-Friday 8 AM - 5 PM) [phone] After 5 PM and follow prompts

## 2015-09-22 NOTE — Patient Instructions (Signed)
Will check left ankle xray and call with results  Continue current medications as ordered  Follow up in 3 mos for routine visit. Fasting labs prior to visit  Follow up with specialists as scheduled

## 2015-10-02 DIAGNOSIS — N819 Female genital prolapse, unspecified: Secondary | ICD-10-CM | POA: Diagnosis not present

## 2015-10-09 ENCOUNTER — Other Ambulatory Visit: Payer: Self-pay | Admitting: Internal Medicine

## 2015-10-16 DIAGNOSIS — N819 Female genital prolapse, unspecified: Secondary | ICD-10-CM | POA: Diagnosis not present

## 2015-10-16 DIAGNOSIS — N765 Ulceration of vagina: Secondary | ICD-10-CM | POA: Diagnosis not present

## 2015-10-19 ENCOUNTER — Other Ambulatory Visit: Payer: Self-pay | Admitting: Physician Assistant

## 2015-10-19 ENCOUNTER — Other Ambulatory Visit: Payer: Self-pay | Admitting: Internal Medicine

## 2015-11-02 DIAGNOSIS — N8189 Other female genital prolapse: Secondary | ICD-10-CM | POA: Diagnosis not present

## 2015-11-08 ENCOUNTER — Encounter: Payer: Self-pay | Admitting: Internal Medicine

## 2015-11-15 ENCOUNTER — Other Ambulatory Visit: Payer: Self-pay | Admitting: *Deleted

## 2015-11-15 MED ORDER — LORAZEPAM 1 MG PO TABS: ORAL_TABLET | ORAL | Status: DC

## 2015-11-15 NOTE — Telephone Encounter (Signed)
Marissa Bowen 

## 2015-11-27 ENCOUNTER — Ambulatory Visit
Admission: RE | Admit: 2015-11-27 | Discharge: 2015-11-27 | Disposition: A | Discharge status: Home / Self Care | Payer: Medicare Other | Source: Ambulatory Visit | Attending: Internal Medicine | Admitting: Internal Medicine

## 2015-11-27 ENCOUNTER — Other Ambulatory Visit: Payer: Self-pay | Admitting: Internal Medicine

## 2015-11-27 DIAGNOSIS — M25472 Effusion, left ankle: Secondary | ICD-10-CM

## 2015-11-27 DIAGNOSIS — M7989 Other specified soft tissue disorders: Secondary | ICD-10-CM | POA: Diagnosis not present

## 2015-11-30 DIAGNOSIS — Z1231 Encounter for screening mammogram for malignant neoplasm of breast: Secondary | ICD-10-CM | POA: Diagnosis not present

## 2015-11-30 DIAGNOSIS — Z01419 Encounter for gynecological examination (general) (routine) without abnormal findings: Secondary | ICD-10-CM | POA: Diagnosis not present

## 2015-11-30 DIAGNOSIS — N3945 Continuous leakage: Secondary | ICD-10-CM | POA: Diagnosis not present

## 2015-11-30 DIAGNOSIS — Z6824 Body mass index (BMI) 24.0-24.9, adult: Secondary | ICD-10-CM | POA: Diagnosis not present

## 2015-11-30 DIAGNOSIS — N819 Female genital prolapse, unspecified: Secondary | ICD-10-CM | POA: Diagnosis not present

## 2015-12-01 ENCOUNTER — Encounter: Payer: Self-pay | Admitting: Gastroenterology

## 2015-12-08 ENCOUNTER — Other Ambulatory Visit: Payer: Self-pay | Admitting: Internal Medicine

## 2015-12-15 ENCOUNTER — Other Ambulatory Visit: Payer: Self-pay

## 2015-12-15 DIAGNOSIS — E785 Hyperlipidemia, unspecified: Secondary | ICD-10-CM

## 2015-12-15 DIAGNOSIS — I480 Paroxysmal atrial fibrillation: Secondary | ICD-10-CM

## 2015-12-15 DIAGNOSIS — I10 Essential (primary) hypertension: Secondary | ICD-10-CM

## 2015-12-20 ENCOUNTER — Other Ambulatory Visit: Payer: Self-pay | Admitting: Cardiology

## 2015-12-25 ENCOUNTER — Other Ambulatory Visit: Payer: Self-pay | Admitting: *Deleted

## 2015-12-25 MED ORDER — LORAZEPAM 1 MG PO TABS: ORAL_TABLET | ORAL | 0 refills | Status: DC

## 2015-12-25 NOTE — Telephone Encounter (Signed)
[facility] 

## 2015-12-27 ENCOUNTER — Other Ambulatory Visit: Payer: Medicare Other

## 2015-12-27 DIAGNOSIS — I480 Paroxysmal atrial fibrillation: Secondary | ICD-10-CM

## 2015-12-27 DIAGNOSIS — I10 Essential (primary) hypertension: Secondary | ICD-10-CM | POA: Diagnosis not present

## 2015-12-27 DIAGNOSIS — E785 Hyperlipidemia, unspecified: Secondary | ICD-10-CM | POA: Diagnosis not present

## 2015-12-27 LAB — BASIC METABOLIC PANEL WITH GFR
BUN: 12 mg/dL (ref 7–25)
CO2: 28 mmol/L (ref 20–31)
Calcium: 9.1 mg/dL (ref 8.6–10.4)
Chloride: 102 mmol/L (ref 98–110)
Creat: 0.7 mg/dL (ref 0.60–0.88)
GFR, EST NON AFRICAN AMERICAN: 78 mL/min (ref >=60)
GLUCOSE: 77 mg/dL (ref 65–99)
POTASSIUM: 4.4 mmol/L (ref 3.5–5.3)
Sodium: 139 mmol/L (ref 135–146)

## 2015-12-27 LAB — LIPID PANEL
CHOLESTEROL: 228 mg/dL — AB (ref 125–200)
HDL: 62 mg/dL (ref >=46)
LDL CALC: 138 mg/dL — AB (ref <130)
Total CHOL/HDL Ratio: 3.7 Ratio (ref <=5.0)
Triglycerides: 142 mg/dL (ref <150)
VLDL: 28 mg/dL (ref <30)

## 2015-12-27 LAB — ALT: ALT: 11 U/L (ref 6–29)

## 2015-12-29 ENCOUNTER — Ambulatory Visit (INDEPENDENT_AMBULATORY_CARE_PROVIDER_SITE_OTHER): Payer: Medicare Other | Admitting: Internal Medicine

## 2015-12-29 ENCOUNTER — Encounter: Payer: Self-pay | Admitting: Internal Medicine

## 2015-12-29 VITALS — BP 124/68 | HR 60 | Temp 97.7°F | Ht 68.0 in | Wt 151.6 lb

## 2015-12-29 DIAGNOSIS — Z23 Encounter for immunization: Secondary | ICD-10-CM

## 2015-12-29 DIAGNOSIS — I6523 Occlusion and stenosis of bilateral carotid arteries: Secondary | ICD-10-CM | POA: Diagnosis not present

## 2015-12-29 DIAGNOSIS — M150 Primary generalized (osteo)arthritis: Secondary | ICD-10-CM | POA: Diagnosis not present

## 2015-12-29 DIAGNOSIS — I480 Paroxysmal atrial fibrillation: Secondary | ICD-10-CM

## 2015-12-29 DIAGNOSIS — M159 Polyosteoarthritis, unspecified: Secondary | ICD-10-CM

## 2015-12-29 DIAGNOSIS — F418 Other specified anxiety disorders: Secondary | ICD-10-CM | POA: Diagnosis not present

## 2015-12-29 DIAGNOSIS — I10 Essential (primary) hypertension: Secondary | ICD-10-CM | POA: Diagnosis not present

## 2015-12-29 DIAGNOSIS — E785 Hyperlipidemia, unspecified: Secondary | ICD-10-CM | POA: Diagnosis not present

## 2015-12-29 MED ORDER — ROSUVASTATIN CALCIUM 5 MG PO TABS: 5.0000 mg | ORAL_TABLET | ORAL | 2 refills | Status: DC

## 2015-12-29 NOTE — Patient Instructions (Addendum)
Start OTC CoQ10 daily with crestor to reduce muscle aches/weakness  Take crestor 3 times per week (Monday, Wednesday, Friday)  Continue other medications as ordered  Follow up with specialists as scheduled  Check fasting lipid panel and ALT in 1 month  Follow up in 3 mos for routine visit. Fasting labs prior to routine appt

## 2015-12-29 NOTE — Progress Notes (Signed)
Patient ID: Marissa Bowen, female   DOB: 1926/08/23, 82 y.o.   MRN: [id number]    Location:  [facility] Place of Service: OFFICE  Chief Complaint  Patient presents with  . Medical Management of Chronic Issues    3 months follow up  . Other    flu vacc request    HPI:  82 yo female seen today for f/u. Her lipid panel is worse. She has taken welchol in the past but stopped "for unknown reason". Has tried statins in the past, including crestor but experienced myalgias and stopped taking it.   She saw her eye specialist Dr Gershon Crane and was dx with macular degeneration L>R. She was Rx preservision gtts. She is a poor historian due to memory loss. Hx obtained from chart  Depression/grief reaction - improving. has tried 4 meds (lexapro, zoloft, remeron and trazodone) in the past and unable to tolerate ADRs. No anaphylaxis to meds tried. She misses her deceased spouse but overall mood is improved. Some short term memory issues. Takes prn lorazepam and prn ambien. She has difficulty staying asleep.   HTN/CAD/hyperlipidemia/PAF - rate controlled on metoprolol and amiodarone. She takes losartan for BP. Taking eliquis for anticoagulation. On niacin for cholesterol. Followed by cardiology  GERD - stable on protonix. Occasional diarrhea. Takes pepto bismul prn  Migraine - stable. No exacerbations since last OV  Hx CVA - no new sx's. Takes eliquis and niacin  She also takes HRT - estrace cream. Followed by GYN  Prolapsed bladder - followed by GYN and has a pessary. She c/o urinary leakage that requires her to wear panty liners  Past Medical History:  Diagnosis Date  . Anemia   . Anxiety   . BACK PAIN, LUMBAR 09/27/2009  . CAD (coronary artery disease)    a. s/p remote CABG 1997, 3V.;  b. LexiScan Myoview (8/15): No ischemia, EF 72%, normal study;  c. Echo 12/13 - EF 60-65%, no significant valvular abnormalities, normal RV size and systolic function.   . CVA (cerebral infarction)    a. Thalamic  infarct 09/2009. //  b. hx of TIA in 2008  . Gastric ulcer   . GERD 11/25/2006  . GI bleed    a. 09/2009: secondary to antral ulcer.  . Hiatal hernia   . History of diverticulitis Dx 02/2012  . History of MI (myocardial infarction)   . HYPERLIPIDEMIA   . HYPERTENSION   . IBS 12/07/2008  . MIGRAINE WITH AURA 08/13/2007  . Mild carotid artery disease ([place])    a. 0-39% bilat 05/2011 (f/u recommended 05/2013). //  b. Carotid US 3/17 - Bilat ICA 1-39% >> FU prn  . Muscle weakness (generalized) 09/27/2009  . OSTEOARTHRITIS 08/13/2007  . PAF (paroxysmal atrial fibrillation) ([place]) 11/25/2006   a. Multaq >> changes to Amiodarone 2/2 $$  //  Eliquis (CHADS2-VASc = 6)  . Urinary, incontinence, stress female    wears Pessary    Past Surgical History:  Procedure Laterality Date  . ABDOMINAL HYSTERECTOMY  1973  . CATARACT EXTRACTION W/ INTRAOCULAR LENS  IMPLANT, BILATERAL Bilateral 1999  . CHOLECYSTECTOMY N/A 01/03/2015   Procedure: LAPAROSCOPIC CHOLECYSTECTOMY WITH INTRAOPERATIVE CHOLANGIOGRAM;  Surgeon: Donnie Mesa, MD;  Location: [facility];  Service: General;  Laterality: N/A;  . CORONARY ANGIOPLASTY  1997   Dr Lia Foyer  . CORONARY ARTERY BYPASS GRAFT  1997   Dr Melvenia Needles  . LAPAROSCOPIC CHOLECYSTECTOMY  01/03/2015  . TONSILLECTOMY      Patient Care Team: Gildardo Cranker, DO as PCP -  General (Internal Medicine) Hillary Bow, MD (Cardiology) Melissa Montane, MD as Consulting Physician (Otolaryngology) Arvella Nigh, MD as Consulting Physician (Obstetrics and Gynecology) Rutherford Guys, MD as Consulting Physician (Ophthalmology)  Social History   Social History  . Marital status: Widowed    Spouse name: N/A  . Number of children: N/A  . Years of education: N/A   Occupational History  . Not on file.   Social History Main Topics  . Smoking status: Never Smoker  . Smokeless tobacco: Never Used  . Alcohol use No  . Drug use: No  . Sexual activity: No   Other Topics Concern  . Not on file    Social History Narrative  . No narrative on file     reports that she has never smoked. She has never used smokeless tobacco. She reports that she does not drink alcohol or use drugs.  Family History  Problem Relation Age of Onset  . Heart attack Mother   . Heart failure Father   . Throat cancer Sister   . Heart disease Sister   . Coronary artery disease Sister   . Diabetes Sister   . Colon cancer Neg Hx    Family Status  Relation Status  . Mother Deceased  . Father Deceased  . Sister Deceased  . Sister Deceased  . Sister Deceased  . Brother Deceased  . Daughter Alive  . Son Alive  . Sister Alive  . Daughter Alive  . Maternal Grandmother Deceased  . Maternal Grandfather Deceased  . Paternal Grandmother Deceased  . Paternal Grandfather Deceased  . Neg Hx      Allergies  Allergen Reactions  . Asa [Aspirin] Other (See Comments)    Bleeding ulcer  . Hydrocodone Other (See Comments)    REACTION: migraines  . Ibuprofen Other (See Comments)    ulcers  . Oxycodone Hcl Other (See Comments)    REACTION: migraines  . Penicillins Swelling and Rash  . Prednisone Other (See Comments)    ulcers  . Statins Other (See Comments)    Aches and pains  . Pristiq [Desvenlafaxine] Other (See Comments)    Drowsiness and hangover sensation    Medications: Patient's Medications  New Prescriptions   No medications on file  Previous Medications   ACETAMINOPHEN (TYLENOL) 500 MG TABLET    Take 500-1,000 mg by mouth every 6 (six) hours as needed for moderate pain.    AMIODARONE (PACERONE) 200 MG TABLET    TAKE ONE HALF TABLET BY MOUTH DAILY   AMIODARONE (PACERONE) 200 MG TABLET    TAKE ONE-HALF TABLET DAILY   BISMUTH SUBSALICYLATE (PEPTO BISMOL) 262 MG/15ML SUSPENSION    Take 30 mLs by mouth every 6 (six) hours as needed for indigestion.   ELIQUIS 5 MG TABS TABLET    TAKE ONE TABLET TWICE DAILY   ESTRADIOL (ESTRACE) 0.1 MG/GM VAGINAL CREAM    Place 1 Applicatorful vaginally 2  (two) times a week.   FLUTICASONE (FLONASE) 50 MCG/ACT NASAL SPRAY    Place 2 sprays into both nostrils daily.   LORAZEPAM (ATIVAN) 1 MG TABLET    Take one tablet by mouth every eight hours as needed for anxiety   LOSARTAN (COZAAR) 50 MG TABLET    Take 1 tablet (50 mg total) by mouth 2 (two) times daily.   MECLIZINE (ANTIVERT) 12.5 MG TABLET    Take 12.5 mg by mouth 3 (three) times daily as needed for dizziness.   MECLIZINE (ANTIVERT) 12.5 MG TABLET  TAKE ONE OR TWO TABLETS EVERY EIGHT HOURS AS NEEDED FOR DIZZINESS   METOPROLOL SUCCINATE (TOPROL-XL) 25 MG 24 HR TABLET    TAKE ONE-HALF TABLET DAILY   METOPROLOL TARTRATE (LOPRESSOR) 25 MG TABLET    TAKE ONE HALF TABLET BY MOUTH DAILY   MISC. DEVICES (RING PESSARY) MISC    Place vaginally.   MULTIPLE VITAMIN (MULTIVITAMIN) TABLET    Take 1 tablet by mouth daily.     MULTIPLE VITAMINS-MINERALS (PRESERVISION AREDS) TABS    Take 1 tab by mouth in the morning, 1 tablet by mouth in the evening   NIACIN 500 MG CR CAPSULE    Take 500 mg by mouth 2 (two) times daily.    PANTOPRAZOLE (PROTONIX) 40 MG TABLET    Take 40 mg by mouth daily.   TETRAHYDROZOLINE HCL (VISINE OP)    Place 2 drops into both eyes as needed (for dry eyes).   ZOLPIDEM (AMBIEN) 10 MG TABLET    Take 10 mg by mouth at bedtime as needed for sleep.  Modified Medications   No medications on file  Discontinued Medications   No medications on file    Review of Systems  Unable to perform ROS: Psychiatric disorder  and memory loss  Vitals:   12/29/15 0924  BP: 124/68  Pulse: 60  Temp: 97.7 F (36.5 C)  TempSrc: Oral  SpO2: 96%  Weight: 151 lb 9.6 oz (68.8 kg)  Height: 5\' 8"  (1.727 m)   Body mass index is 23.05 kg/m.  Physical Exam  Constitutional: She appears well-developed and well-nourished.  HENT:  Mouth/Throat: Oropharynx is clear and moist. No oropharyngeal exudate.  Eyes: Pupils are equal, round, and reactive to light. No scleral icterus.  Neck: Neck supple. Carotid  bruit is not present. No tracheal deviation present. No thyromegaly present.  Cardiovascular: Normal rate, regular rhythm and intact distal pulses.  Exam reveals no gallop and no friction rub.   Murmur (1/6 SEM) heard. No LE edema b/l. no calf TTP.   Pulmonary/Chest: Effort normal and breath sounds normal. No stridor. No respiratory distress. She has no wheezes. She has no rales.  Abdominal: Soft. Bowel sounds are normal. She exhibits distension. She exhibits no mass. There is no hepatomegaly. There is no tenderness. There is no rebound and no guarding.  Musculoskeletal: She exhibits edema and tenderness.       Back:  Lymphadenopathy:    She has no cervical adenopathy.  Neurological: She is alert.  Skin: Skin is warm and dry. No rash noted.  Psychiatric: Her behavior is normal. She exhibits a depressed mood.     Labs reviewed: Appointment on 12/27/2015  Component Date Value Ref Range Status  . Cholesterol 12/27/2015 228* 125 - 200 mg/dL Final  . Triglycerides 12/27/2015 142  <150 mg/dL Final  . HDL 12/27/2015 62  >=46 mg/dL Final  . Total CHOL/HDL Ratio 12/27/2015 3.7  <=5.0 Ratio Final  . VLDL 12/27/2015 28  <30 mg/dL Final  . LDL Cholesterol 12/27/2015 138* <130 mg/dL Final   Comment:   Total Cholesterol/HDL Ratio:CHD Risk                        Coronary Heart Disease Risk Table                                        Men  Women          1/2 Average Risk              3.4        3.3              Average Risk              5.0        4.4           2X Average Risk              9.6        7.1           3X Average Risk             23.4       11.0 Use the calculated Patient Ratio above and the CHD Risk table  to determine the patient's CHD Risk.   . Sodium 12/27/2015 139  135 - 146 mmol/L Final  . Potassium 12/27/2015 4.4  3.5 - 5.3 mmol/L Final  . Chloride 12/27/2015 102  98 - 110 mmol/L Final  . CO2 12/27/2015 28  20 - 31 mmol/L Final  . Glucose, Bld 12/27/2015 77  65 - 99  mg/dL Final  . BUN 12/27/2015 12  7 - 25 mg/dL Final  . Creat 12/27/2015 0.70  0.60 - 0.88 mg/dL Final   Comment:   For patients > or = 80 years of age: The upper reference limit for Creatinine is approximately 13% higher for people identified as African-American.     . Calcium 12/27/2015 9.1  8.6 - 10.4 mg/dL Final  . GFR, Est African American 12/27/2015 >89  >=60 mL/min Final  . GFR, Est Non African American 12/27/2015 78  >=60 mL/min Final  . ALT 12/27/2015 11  6 - 29 U/L Final    No results found.   Assessment/Plan   ICD-9-CM ICD-10-CM   1. Hyperlipidemia LDL goal <100 272.4 E78.5 rosuvastatin (CRESTOR) 5 MG tablet     Lipid Panel     ALT     CANCELED: Lipid Panel     CANCELED: ALT  2. Encounter for immunization Z23 Z23 Flu Vaccine QUAD 36+ mos IM     rosuvastatin (CRESTOR) 5 MG tablet  3. Primary osteoarthritis involving multiple joints 715.09 M15.0   4. Depression with anxiety 300.4 F41.8 ALT     CANCELED: ALT  5. Paroxysmal atrial fibrillation (HCC) 427.31 I48.0   6. Essential hypertension 401.9 I10     Start OTC CoQ10 daily with crestor to reduce muscle aches/weakness  Take crestor 3 times per week (Monday, Wednesday, Friday)  Continue other medications as ordered  Follow up with specialists as scheduled  Check fasting lipid panel and ALT in 1 month  Follow up in 3 mos for routine visit. Fasting labs prior to routine appt (bmp, alt, lipid)   Darria Corvera S. Perlie Gold  [facility] [address] [place], [place] [address] [phone] Cell (Monday-Friday 8 AM - 5 PM) [phone] After 5 PM and follow prompts

## 2016-01-03 ENCOUNTER — Telehealth: Payer: Self-pay | Admitting: *Deleted

## 2016-01-03 ENCOUNTER — Other Ambulatory Visit: Payer: Self-pay | Admitting: Internal Medicine

## 2016-01-03 NOTE — Telephone Encounter (Signed)
Recommend she be seen at urgent care or make OV appt

## 2016-01-03 NOTE — Telephone Encounter (Signed)
Patient called and stated that she has been having Nausea and vomiting, diarrhea since Monday. Patient is not sure if it is coming from starting the new medication Crestor and COQ10 or just a stomach bug. Is weak, chills, no fever. Please Advise.

## 2016-01-03 NOTE — Telephone Encounter (Signed)
Patient notified and stated that she is over the worst part and is staying hydrated. Does not want to be seen.  Will call us back if worsens.

## 2016-01-17 ENCOUNTER — Other Ambulatory Visit: Payer: Self-pay | Admitting: *Deleted

## 2016-01-17 MED ORDER — ZOLPIDEM TARTRATE 10 MG PO TABS: 10.0000 mg | ORAL_TABLET | Freq: Every evening | ORAL | 0 refills | Status: DC | PRN

## 2016-01-17 NOTE — Telephone Encounter (Signed)
Patient requested and will fax to pharmacy.

## 2016-01-29 ENCOUNTER — Other Ambulatory Visit: Payer: Medicare Other

## 2016-01-29 DIAGNOSIS — N8189 Other female genital prolapse: Secondary | ICD-10-CM | POA: Diagnosis not present

## 2016-01-29 DIAGNOSIS — F418 Other specified anxiety disorders: Secondary | ICD-10-CM

## 2016-01-29 DIAGNOSIS — E785 Hyperlipidemia, unspecified: Secondary | ICD-10-CM | POA: Diagnosis not present

## 2016-01-29 LAB — LIPID PANEL
CHOLESTEROL: 175 mg/dL (ref 125–200)
HDL: 50 mg/dL (ref >=46)
LDL Cholesterol: 103 mg/dL (ref <130)
TRIGLYCERIDES: 111 mg/dL (ref <150)
Total CHOL/HDL Ratio: 3.5 Ratio (ref <=5.0)
VLDL: 22 mg/dL (ref <30)

## 2016-01-29 LAB — ALT: ALT: 12 U/L (ref 6–29)

## 2016-01-30 ENCOUNTER — Other Ambulatory Visit: Payer: Self-pay | Admitting: Internal Medicine

## 2016-01-30 ENCOUNTER — Other Ambulatory Visit: Payer: Self-pay | Admitting: *Deleted

## 2016-01-30 MED ORDER — LORAZEPAM 1 MG PO TABS: ORAL_TABLET | ORAL | 0 refills | Status: DC

## 2016-01-30 NOTE — Telephone Encounter (Signed)
[facility] 

## 2016-02-06 ENCOUNTER — Ambulatory Visit: Payer: Medicare Other | Admitting: Gastroenterology

## 2016-02-14 ENCOUNTER — Other Ambulatory Visit: Payer: Self-pay | Admitting: *Deleted

## 2016-02-14 MED ORDER — ZOLPIDEM TARTRATE 10 MG PO TABS: 10.0000 mg | ORAL_TABLET | Freq: Every evening | ORAL | 0 refills | Status: DC | PRN

## 2016-02-14 NOTE — Telephone Encounter (Signed)
[facility] 

## 2016-02-23 ENCOUNTER — Telehealth: Payer: Self-pay | Admitting: *Deleted

## 2016-02-23 NOTE — Telephone Encounter (Signed)
Patient called and stated that she is having problems taking the Crestor. Stated that she discussed with you about Statins causing her legs and arms to ache but you prescribed her Crestor anyway and now her arms and legs are aching and hurts and she already has balance issues. Please Advise.

## 2016-02-23 NOTE — Addendum Note (Signed)
Addended by: Rafael Bihari A on: 02/23/2016 03:41 PM   Modules accepted: Orders

## 2016-02-23 NOTE — Telephone Encounter (Signed)
Patient notified and stated that she will just wait until her appointment on 03/15/16 to discuss alternative Cholesterol medication.

## 2016-02-23 NOTE — Telephone Encounter (Signed)
D/c crestor due to myalgias. Stop co q10. Recommend rapatha for cholesterol due to intolerance of statins. It is an injectable medication you would take every 2 weeks

## 2016-02-26 DIAGNOSIS — N8189 Other female genital prolapse: Secondary | ICD-10-CM | POA: Diagnosis not present

## 2016-02-29 ENCOUNTER — Other Ambulatory Visit: Payer: Self-pay | Admitting: Internal Medicine

## 2016-03-05 ENCOUNTER — Other Ambulatory Visit: Payer: Self-pay | Admitting: *Deleted

## 2016-03-05 MED ORDER — LORAZEPAM 1 MG PO TABS: ORAL_TABLET | ORAL | 0 refills | Status: DC

## 2016-03-05 NOTE — Telephone Encounter (Signed)
[facility] 

## 2016-03-07 ENCOUNTER — Ambulatory Visit (INDEPENDENT_AMBULATORY_CARE_PROVIDER_SITE_OTHER): Payer: Medicare Other | Admitting: Internal Medicine

## 2016-03-07 VITALS — BP 158/70 | HR 61 | Temp 98.0°F | Wt 152.0 lb

## 2016-03-07 DIAGNOSIS — R5382 Chronic fatigue, unspecified: Secondary | ICD-10-CM

## 2016-03-07 DIAGNOSIS — R195 Other fecal abnormalities: Secondary | ICD-10-CM | POA: Diagnosis not present

## 2016-03-07 DIAGNOSIS — J069 Acute upper respiratory infection, unspecified: Secondary | ICD-10-CM | POA: Diagnosis not present

## 2016-03-07 DIAGNOSIS — R110 Nausea: Secondary | ICD-10-CM | POA: Diagnosis not present

## 2016-03-07 DIAGNOSIS — K915 Postcholecystectomy syndrome: Secondary | ICD-10-CM

## 2016-03-07 DIAGNOSIS — I6523 Occlusion and stenosis of bilateral carotid arteries: Secondary | ICD-10-CM

## 2016-03-07 LAB — CBC WITH DIFFERENTIAL/PLATELET
Basophils Absolute: 66 cells/uL (ref 0–200)
Basophils Relative: 1 %
Eosinophils Absolute: 330 cells/uL (ref 15–500)
Eosinophils Relative: 5 %
HCT: 36.4 % (ref 35.0–45.0)
Hemoglobin: 11.8 g/dL (ref 11.7–15.5)
Lymphocytes Relative: 23 %
Lymphs Abs: 1518 cells/uL (ref 850–3900)
MCH: 29.7 pg (ref 27.0–33.0)
MCHC: 32.4 g/dL (ref 32.0–36.0)
MCV: 91.7 fL (ref 80.0–100.0)
MPV: 9.9 fL (ref 7.5–12.5)
Monocytes Absolute: 594 cells/uL (ref 200–950)
Monocytes Relative: 9 %
Neutro Abs: 4092 cells/uL (ref 1500–7800)
Neutrophils Relative %: 62 %
Platelets: 234 10*3/uL (ref 140–400)
RBC: 3.97 MIL/uL (ref 3.80–5.10)
RDW: 13.6 % (ref 11.0–15.0)
WBC: 6.6 10*3/uL (ref 3.8–10.8)

## 2016-03-07 MED ORDER — ONDANSETRON HCL 4 MG PO TABS: 4.0000 mg | ORAL_TABLET | Freq: Three times a day (TID) | ORAL | 0 refills | Status: DC | PRN

## 2016-03-07 NOTE — Patient Instructions (Signed)
Try to avoid fatty and greasy foods.   Use imodium if you develop diarrhea from those foods, but don't take more than 2 capsules in one day Also I sent in zofran to the pharmacy for nausea.

## 2016-03-07 NOTE — Progress Notes (Signed)
Location:  [facility] clinic Provider: Reubin Bushnell L. Mariea Clonts, D.O., C.M.D.  Code Status: ? Full code--is listed from hospitalizations, but does have living will which is not on file  Goals of Care:  Advanced Directives 12/29/2015  Does patient have an advance directive? Yes  Type of Advance Directive Living will  Does patient want to make changes to advanced directive? No - Patient declined  Copy of advanced directive(s) in chart? No - copy requested  Would patient like information on creating an advanced directive? -     Chief Complaint  Patient presents with  . Acute Visit    nausea, diarrhea    HPI: Patient is a 82 y.o. female seen today for an acute visit for feeling nauseous for a couple of weeks.   Had gall bladder surgery a year or so ago (9/16).  She has to be very careful about what she eats or she gets nauseous and has diarrhea.   Did eat food she should not have over the weekend.  Had company over the weekend and ate meat she normally would not eat.  She's been sick all week since. Before that she was nauseated once in a while before that.  Had bbq chicken, smokey bones brisket, then ham on Sunday.  Says she's had more problems since her chole.   Eats a lot of eggs and vegetables normally.   Loose stools, sometimes a couple per day or one explosive diarrhea episode.  Usually a little dark, but no blood. No h/o pancreatitis. Feels tired and run down all of the time.  Feels like a puppet with the strings cut.   Has a cold also.  Wants me to check her chest also.   Stopped the macrobid for a UTI 2 days ago.  That is resolved.  Seems like bowels slowed down when that was finished.  Past Medical History:  Diagnosis Date  . Anemia   . Anxiety   . BACK PAIN, LUMBAR 09/27/2009  . CAD (coronary artery disease)    a. s/p remote CABG 1997, 3V.;  b. LexiScan Myoview (8/15): No ischemia, EF 72%, normal study;  c. Echo 12/13 - EF 60-65%, no significant valvular abnormalities, normal RV size and  systolic function.   . CVA (cerebral infarction)    a. Thalamic infarct 09/2009. //  b. hx of TIA in 2008  . Gastric ulcer   . GERD 11/25/2006  . GI bleed    a. 09/2009: secondary to antral ulcer.  . Hiatal hernia   . History of diverticulitis Dx 02/2012  . History of MI (myocardial infarction)   . HYPERLIPIDEMIA   . HYPERTENSION   . IBS 12/07/2008  . MIGRAINE WITH AURA 08/13/2007  . Mild carotid artery disease ([place])    a. 0-39% bilat 05/2011 (f/u recommended 05/2013). //  b. Carotid US 3/17 - Bilat ICA 1-39% >> FU prn  . Muscle weakness (generalized) 09/27/2009  . OSTEOARTHRITIS 08/13/2007  . PAF (paroxysmal atrial fibrillation) ([place]) 11/25/2006   a. Multaq >> changes to Amiodarone 2/2 $$  //  Eliquis (CHADS2-VASc = 6)  . Urinary, incontinence, stress female    wears Pessary    Past Surgical History:  Procedure Laterality Date  . ABDOMINAL HYSTERECTOMY  1973  . CATARACT EXTRACTION W/ INTRAOCULAR LENS  IMPLANT, BILATERAL Bilateral 1999  . CHOLECYSTECTOMY N/A 01/03/2015   Procedure: LAPAROSCOPIC CHOLECYSTECTOMY WITH INTRAOPERATIVE CHOLANGIOGRAM;  Surgeon: Donnie Mesa, MD;  Location: [place];  Service: General;  Laterality: N/A;  . CORONARY ANGIOPLASTY  1997   Dr Lia Foyer  . CORONARY ARTERY BYPASS GRAFT  1997   Dr Melvenia Needles  . LAPAROSCOPIC CHOLECYSTECTOMY  01/03/2015  . TONSILLECTOMY      Allergies  Allergen Reactions  . Asa [Aspirin] Other (See Comments)    Bleeding ulcer  . Hydrocodone Other (See Comments)    REACTION: migraines  . Ibuprofen Other (See Comments)    ulcers  . Oxycodone Hcl Other (See Comments)    REACTION: migraines  . Penicillins Swelling and Rash  . Prednisone Other (See Comments)    ulcers  . Statins Other (See Comments)    Aches and pains  . Crestor [Rosuvastatin Calcium] Other (See Comments)    myalgia  . Pristiq [Desvenlafaxine] Other (See Comments)    Drowsiness and hangover sensation      Medication List       Accurate as of 03/07/16  2:21 PM.  Always use your most recent med list.          acetaminophen 500 MG tablet Commonly known as:  TYLENOL Take 500-1,000 mg by mouth every 6 (six) hours as needed for moderate pain.   amiodarone 200 MG tablet Commonly known as:  PACERONE TAKE ONE-HALF TABLET DAILY   bismuth subsalicylate [id number] suspension Commonly known as:  PEPTO BISMOL Take 30 mLs by mouth every 6 (six) hours as needed for indigestion.   ELIQUIS 5 MG Tabs tablet Generic drug:  apixaban TAKE ONE TABLET TWICE DAILY   estradiol 0.1 MG/GM vaginal cream Commonly known as:  ESTRACE Place 1 Applicatorful vaginally 2 (two) times a week.   fluticasone 50 MCG/ACT nasal spray Commonly known as:  FLONASE Place 2 sprays into both nostrils daily.   LORazepam 1 MG tablet Commonly known as:  ATIVAN Take one tablet by mouth every eight hours as needed for anxiety   losartan 50 MG tablet Commonly known as:  COZAAR Take 1 tablet (50 mg total) by mouth 2 (two) times daily.   meclizine 12.5 MG tablet Commonly known as:  ANTIVERT TAKE ONE OR TWO TABLETS EVERY EIGHT HOURS AS NEEDED FOR DIZZINESS   metoprolol succinate 25 MG 24 hr tablet Commonly known as:  TOPROL-XL TAKE ONE-HALF TABLET DAILY   metoprolol tartrate 25 MG tablet Commonly known as:  LOPRESSOR TAKE ONE HALF TABLET BY MOUTH DAILY   multivitamin tablet Take 1 tablet by mouth daily.   niacin 500 MG CR capsule Take 500 mg by mouth 2 (two) times daily.   nitrofurantoin (macrocrystal-monohydrate) 100 MG capsule Commonly known as:  MACROBID   pantoprazole 40 MG tablet Commonly known as:  PROTONIX TAKE ONE TABLET TWICE DAILY   PRESERVISION AREDS Tabs Take 1 tab by mouth in the morning, 1 tablet by mouth in the evening   Ring Pessary Misc Place vaginally.   VISINE OP Place 2 drops into both eyes as needed (for dry eyes).   zolpidem 10 MG tablet Commonly known as:  AMBIEN Take 1 tablet (10 mg total) by mouth at bedtime as needed for sleep.         Review of Systems:  Review of Systems  Constitutional: Positive for malaise/fatigue. Negative for chills and fever.  HENT: Positive for congestion. Negative for ear pain, sinus pain and sore throat.   Respiratory: Positive for cough. Negative for sputum production and shortness of breath.   Cardiovascular: Negative for chest pain and palpitations.  Gastrointestinal: Positive for abdominal pain, diarrhea and nausea. Negative for blood in stool, constipation, heartburn, melena and vomiting.  Genitourinary: Negative for dysuria, frequency and urgency.  Musculoskeletal: Negative for falls and joint pain.  Skin: Negative for itching and rash.  Neurological: Positive for weakness. Negative for dizziness and loss of consciousness.  Psychiatric/Behavioral: The patient is nervous/anxious.     Health Maintenance  Topic Date Due  . TETANUS/TDAP  09/19/2016 (Originally 10/28/1945)  . ZOSTAVAX  09/21/2016 (Originally 10/27/1986)  . PNA vac Low Risk Adult (2 of 2 - PPSV23) 09/21/2016 (Originally 09/30/2014)  . INFLUENZA VACCINE  Completed  . DEXA SCAN  Completed    Physical Exam: Vitals:   03/07/16 1331  BP: (!) 158/70  Pulse: 61  Temp: 98 F (36.7 C)  TempSrc: Oral  SpO2: 98%  Weight: 152 lb (68.9 kg)   Body mass index is 23.11 kg/m. Physical Exam  Constitutional: She is oriented to person, place, and time. No distress.  HENT:  Head: Normocephalic and atraumatic.  Cardiovascular: Normal rate, regular rhythm and normal heart sounds.   Pulmonary/Chest: Effort normal and breath sounds normal.  Few scattered rhonchi  Abdominal: Soft. Bowel sounds are normal. She exhibits no distension and no mass. There is tenderness. There is no rebound and no guarding. No hernia.  LLQ and RUQ mildly tender  Musculoskeletal:  Unsteady gait  Neurological: She is alert and oriented to person, place, and time.  Skin: Skin is warm and dry. There is pallor.  Psychiatric: She has a normal mood and  affect.    Labs reviewed: Basic Metabolic Panel:  Recent Labs  03/09/15 1702 07/05/15 0918 08/10/15 1540 12/27/15 0849  NA 140 140  --  139  K 4.1 4.0  --  4.4  CL 98 97  --  102  CO2 28 27  --  28  GLUCOSE 85 82  --  77  BUN 11 12  --  12  CREATININE 0.57 0.71  --  0.70  CALCIUM 9.2 9.0  --  9.1  TSH  --   --  1.85  --    Liver Function Tests:  Recent Labs  08/10/15 1540 12/27/15 0849 01/29/16 1000  AST 15  --   --   ALT 13 11 12   ALKPHOS 79  --   --   BILITOT 0.4  --   --   PROT 6.9  --   --   ALBUMIN 4.1  --   --    No results for input(s): LIPASE, AMYLASE in the last 8760 hours. No results for input(s): AMMONIA in the last 8760 hours. CBC:  Recent Labs  03/09/15 1702 08/10/15 1540  WBC 7.0 6.2  NEUTROABS 4.5 3,472  HGB  --  12.3  HCT 38.4 36.8  MCV 90 90.9  PLT 239 229   Lipid Panel:  Recent Labs  07/05/15 0918 12/27/15 0849 01/29/16 1000  CHOL 209* 228* 175  HDL 61 62 50  LDLCALC 124* 138* 103  TRIG 120 142 111  CHOLHDL 3.4 3.7 3.5   Lab Results  Component Value Date   HGBA1C (H) 09/29/2009    5.8 (NOTE)                                                                       According to the ADA Clinical Practice Recommendations  for 2011, when HbA1c is used as a screening test:   >=6.5%   Diagnostic of Diabetes Mellitus           (if abnormal result  is confirmed)  5.7-6.4%   Increased risk of developing Diabetes Mellitus  References:Diagnosis and Classification of Diabetes Mellitus,Diabetes [id number] 1):S62-S69 and Standards of Medical Care in         Diabetes - 2011,Diabetes [id number]  (Suppl 1):S11-S61.    Assessment/Plan 1. Nausea without vomiting - says it's been coming and going since her gall bladder surgery especially if she eats meats or any fried foods (she favors these so it's hard to avoid them)--will r/o a pancreatitis and also give her some zofran to use if she does have any severe episodes -this also may have  been made worse by the UTI she had and the macrobid she took - CBC with Differential/Platelet - COMPLETE METABOLIC PANEL WITH GFR - Lipase - Amylase - ondansetron (ZOFRAN) 4 MG tablet; Take 1 tablet (4 mg total) by mouth every 8 (eight) hours as needed for nausea or vomiting.  Dispense: 20 tablet; Refill: 0  2. Loose stools - was definitely worse on macrobid and better this am after stopping it two days back--also may be post-chole syndrome due to her correlation with her surgery - COMPLETE METABOLIC PANEL WITH GFR - Lipase - Amylase  3. Acute upper respiratory infection - lungs were not completely clear with some coarse rhonchi and given how poor she was feeling, I opted to check cbc - CBC with Differential/Platelet  4. Chronic fatigue - reports this since her surgery also--f/u blood counts - CBC with Differential/Platelet - COMPLETE METABOLIC PANEL WITH GFR  5. Post-cholecystectomy syndrome -fits best with her symptoms -if she continues with this, she may need GI f/u (don't know of anything that can really be done besides avoiding triggering foods though)  Labs/tests ordered:   Orders Placed This Encounter  Procedures  . CBC with Differential/Platelet  . COMPLETE METABOLIC PANEL WITH GFR    [facility]  . Lipase  . Amylase    Next appt:  03/15/2016 appt with Dr. Nelly Rout L. Janaisha Tolsma, D.O. [facility] [address], [place] [address] Cell Phone (Mon-Fri 8am-5pm):  [phone] On Call:  [phone] & follow prompts after 5pm & weekends Office Phone:  [phone] Office Fax:  [phone]

## 2016-03-08 ENCOUNTER — Other Ambulatory Visit: Payer: Self-pay | Admitting: Internal Medicine

## 2016-03-08 DIAGNOSIS — R197 Diarrhea, unspecified: Secondary | ICD-10-CM

## 2016-03-08 DIAGNOSIS — R740 Nonspecific elevation of levels of transaminase and lactic acid dehydrogenase [LDH]: Principal | ICD-10-CM

## 2016-03-08 DIAGNOSIS — R7401 Elevation of levels of liver transaminase levels: Secondary | ICD-10-CM

## 2016-03-08 DIAGNOSIS — R110 Nausea: Secondary | ICD-10-CM

## 2016-03-08 LAB — COMPLETE METABOLIC PANEL WITH GFR
ALT: 96 U/L — ABNORMAL HIGH (ref 6–29)
AST: 34 U/L (ref 10–35)
Albumin: 3.5 g/dL — ABNORMAL LOW (ref 3.6–5.1)
Alkaline Phosphatase: 155 U/L — ABNORMAL HIGH (ref 33–130)
BUN: 14 mg/dL (ref 7–25)
CO2: 25 mmol/L (ref 20–31)
Calcium: 9.1 mg/dL (ref 8.6–10.4)
Chloride: 102 mmol/L (ref 98–110)
Creat: 0.54 mg/dL — ABNORMAL LOW (ref 0.60–0.88)
GFR, Est African American: >89 mL/min (ref >=60)
GFR, Est Non African American: 85 mL/min (ref >=60)
Glucose, Bld: 181 mg/dL — ABNORMAL HIGH (ref 65–99)
Potassium: 3.8 mmol/L (ref 3.5–5.3)
Sodium: 140 mmol/L (ref 135–146)
Total Bilirubin: 0.7 mg/dL (ref 0.2–1.2)
Total Protein: 6.3 g/dL (ref 6.1–8.1)

## 2016-03-08 LAB — LIPASE: Lipase: 34 U/L (ref 7–60)

## 2016-03-08 LAB — AMYLASE: Amylase: 57 U/L (ref 0–105)

## 2016-03-13 ENCOUNTER — Encounter: Payer: Self-pay | Admitting: Internal Medicine

## 2016-03-13 ENCOUNTER — Ambulatory Visit
Admission: RE | Admit: 2016-03-13 | Discharge: 2016-03-13 | Disposition: A | Discharge status: Home / Self Care | Payer: Medicare Other | Source: Ambulatory Visit | Attending: Internal Medicine | Admitting: Internal Medicine

## 2016-03-13 DIAGNOSIS — R110 Nausea: Secondary | ICD-10-CM

## 2016-03-13 DIAGNOSIS — R740 Nonspecific elevation of levels of transaminase and lactic acid dehydrogenase [LDH]: Principal | ICD-10-CM

## 2016-03-13 DIAGNOSIS — R197 Diarrhea, unspecified: Secondary | ICD-10-CM

## 2016-03-13 DIAGNOSIS — R7401 Elevation of levels of liver transaminase levels: Secondary | ICD-10-CM

## 2016-03-15 ENCOUNTER — Ambulatory Visit (INDEPENDENT_AMBULATORY_CARE_PROVIDER_SITE_OTHER): Payer: Medicare Other | Admitting: Internal Medicine

## 2016-03-15 ENCOUNTER — Encounter: Payer: Self-pay | Admitting: Internal Medicine

## 2016-03-15 VITALS — BP 142/72 | HR 57 | Temp 97.9°F | Ht 68.0 in | Wt 153.0 lb

## 2016-03-15 DIAGNOSIS — G4700 Insomnia, unspecified: Secondary | ICD-10-CM

## 2016-03-15 DIAGNOSIS — I6523 Occlusion and stenosis of bilateral carotid arteries: Secondary | ICD-10-CM | POA: Diagnosis not present

## 2016-03-15 DIAGNOSIS — R748 Abnormal levels of other serum enzymes: Secondary | ICD-10-CM

## 2016-03-15 DIAGNOSIS — R110 Nausea: Secondary | ICD-10-CM

## 2016-03-15 DIAGNOSIS — K915 Postcholecystectomy syndrome: Secondary | ICD-10-CM | POA: Diagnosis not present

## 2016-03-15 LAB — HEPATIC FUNCTION PANEL
ALT: 34 U/L — ABNORMAL HIGH (ref 6–29)
AST: 20 U/L (ref 10–35)
Albumin: 3.7 g/dL (ref 3.6–5.1)
Alkaline Phosphatase: 115 U/L (ref 33–130)
BILIRUBIN DIRECT: 0.2 mg/dL (ref <=0.2)
Indirect Bilirubin: 0.3 mg/dL (ref 0.2–1.2)
Total Bilirubin: 0.5 mg/dL (ref 0.2–1.2)
Total Protein: 6.4 g/dL (ref 6.1–8.1)

## 2016-03-15 MED ORDER — ZOLPIDEM TARTRATE 10 MG PO TABS: 10.0000 mg | ORAL_TABLET | Freq: Every evening | ORAL | 3 refills | Status: DC | PRN

## 2016-03-15 NOTE — Progress Notes (Signed)
Patient ID: EMERLY Bowen, female   DOB: Jun 15, 1926, 82 y.o.   MRN: [id number]    Location:  [facility] Place of Service: OFFICE  Chief Complaint  Patient presents with  . Medical Management of Chronic Issues    General health questions  . Other    Discuss Creon    HPI:  82 yo female seen today for acute visit. She continues to have GI sx' s including N without emesis, abdominal pain. Recent RUQ [place] neg. She had cholecystectomy in 2016 but had not been avoiding fatty foods. Labs revealed albumin 3.5 and ALT 96; Alk phos 155; Tbili 0.7; other labs nml. She has since reduced fatty foods and abdomen feels better. No nausea.   She needs RF on [place].  She states her son will be moving her into the [facility] soon  Past Medical History:  Diagnosis Date  . Anemia   . Anxiety   . BACK PAIN, LUMBAR 09/27/2009  . CAD (coronary artery disease)    a. s/p remote CABG 1997, 3V.;  b. LexiScan Myoview (8/15): No ischemia, EF 72%, normal study;  c. Echo 12/13 - EF 60-65%, no significant valvular abnormalities, normal RV size and systolic function.   . CVA (cerebral infarction)    a. Thalamic infarct 09/2009. //  b. hx of TIA in 2008  . Gastric ulcer   . GERD 11/25/2006  . GI bleed    a. 09/2009: secondary to antral ulcer.  . Hiatal hernia   . History of diverticulitis Dx 02/2012  . History of MI (myocardial infarction)   . HYPERLIPIDEMIA   . HYPERTENSION   . IBS 12/07/2008  . MIGRAINE WITH AURA 08/13/2007  . Mild carotid artery disease ([place])    a. 0-39% bilat 05/2011 (f/u recommended 05/2013). //  b. Carotid US 3/17 - Bilat ICA 1-39% >> FU prn  . Muscle weakness (generalized) 09/27/2009  . OSTEOARTHRITIS 08/13/2007  . PAF (paroxysmal atrial fibrillation) ([place]) 11/25/2006   a. Multaq >> changes to Amiodarone 2/2 $$  //  Eliquis (CHADS2-VASc = 6)  . Urinary, incontinence, stress female    wears Pessary    Past Surgical History:  Procedure Laterality Date  . ABDOMINAL HYSTERECTOMY  1973  . CATARACT  EXTRACTION W/ INTRAOCULAR LENS  IMPLANT, BILATERAL Bilateral 1999  . CHOLECYSTECTOMY N/A 01/03/2015   Procedure: LAPAROSCOPIC CHOLECYSTECTOMY WITH INTRAOPERATIVE CHOLANGIOGRAM;  Surgeon: Donnie Mesa, MD;  Location: [facility];  Service: General;  Laterality: N/A;  . CORONARY ANGIOPLASTY  1997   Dr Lia Foyer  . CORONARY ARTERY BYPASS GRAFT  1997   Dr Melvenia Needles  . LAPAROSCOPIC CHOLECYSTECTOMY  01/03/2015  . TONSILLECTOMY      Patient Care Team: Gildardo Cranker, DO as PCP - General (Internal Medicine) Hillary Bow, MD (Cardiology) Melissa Montane, MD as Consulting Physician (Otolaryngology) Arvella Nigh, MD as Consulting Physician (Obstetrics and Gynecology) Rutherford Guys, MD as Consulting Physician (Ophthalmology)  Social History   Social History  . Marital status: Widowed    Spouse name: N/A  . Number of children: N/A  . Years of education: N/A   Occupational History  . Not on file.   Social History Main Topics  . Smoking status: Never Smoker  . Smokeless tobacco: Never Used  . Alcohol use No  . Drug use: No  . Sexual activity: No   Other Topics Concern  . Not on file   Social History Narrative  . No narrative on file     reports that she has never  smoked. She has never used smokeless tobacco. She reports that she does not drink alcohol or use drugs.  Family History  Problem Relation Age of Onset  . Heart attack Mother   . Heart failure Father   . Throat cancer Sister   . Heart disease Sister   . Coronary artery disease Sister   . Diabetes Sister   . Colon cancer Neg Hx    Family Status  Relation Status  . Mother Deceased  . Father Deceased  . Sister Deceased  . Sister Deceased  . Sister Deceased  . Brother Deceased  . Daughter Alive  . Son Alive  . Sister Alive  . Daughter Alive  . Maternal Grandmother Deceased  . Maternal Grandfather Deceased  . Paternal Grandmother Deceased  . Paternal Grandfather Deceased  . Neg Hx      Allergies  Allergen Reactions  .  Asa [Aspirin] Other (See Comments)    Bleeding ulcer  . Hydrocodone Other (See Comments)    REACTION: migraines  . Ibuprofen Other (See Comments)    ulcers  . Oxycodone Hcl Other (See Comments)    REACTION: migraines  . Penicillins Swelling and Rash  . Prednisone Other (See Comments)    ulcers  . Statins Other (See Comments)    Aches and pains  . Crestor [Rosuvastatin Calcium] Other (See Comments)    myalgia  . Pristiq [Desvenlafaxine] Other (See Comments)    Drowsiness and hangover sensation    Medications: Patient's Medications  New Prescriptions   No medications on file  Previous Medications   ACETAMINOPHEN (TYLENOL) 500 MG TABLET    Take 500-1,000 mg by mouth every 6 (six) hours as needed for moderate pain.    AMIODARONE (PACERONE) 200 MG TABLET    TAKE ONE-HALF TABLET DAILY   BISMUTH SUBSALICYLATE (PEPTO BISMOL) 262 MG/15ML SUSPENSION    Take 30 mLs by mouth every 6 (six) hours as needed for indigestion.   ELIQUIS 5 MG TABS TABLET    TAKE ONE TABLET TWICE DAILY   ESTRADIOL (ESTRACE) 0.1 MG/GM VAGINAL CREAM    Place 1 Applicatorful vaginally 2 (two) times a week.   FLUTICASONE (FLONASE) 50 MCG/ACT NASAL SPRAY    Place 2 sprays into both nostrils daily.   LORAZEPAM (ATIVAN) 1 MG TABLET    Take one tablet by mouth every eight hours as needed for anxiety   LOSARTAN (COZAAR) 50 MG TABLET    Take 1 tablet (50 mg total) by mouth 2 (two) times daily.   MECLIZINE (ANTIVERT) 12.5 MG TABLET    TAKE ONE OR TWO TABLETS EVERY EIGHT HOURS AS NEEDED FOR DIZZINESS   METOPROLOL SUCCINATE (TOPROL-XL) 25 MG 24 HR TABLET    TAKE ONE-HALF TABLET DAILY   MISC. DEVICES (RING PESSARY) MISC    Place vaginally.   MULTIPLE VITAMIN (MULTIVITAMIN) TABLET    Take 1 tablet by mouth daily.     MULTIPLE VITAMINS-MINERALS (PRESERVISION AREDS) TABS    Take 1 tab by mouth in the morning, 1 tablet by mouth in the evening   NIACIN 500 MG CR CAPSULE    Take 500 mg by mouth 2 (two) times daily.    NITROFURANTOIN,  MACROCRYSTAL-MONOHYDRATE, (MACROBID) 100 MG CAPSULE       ONDANSETRON (ZOFRAN) 4 MG TABLET    Take 1 tablet (4 mg total) by mouth every 8 (eight) hours as needed for nausea or vomiting.   PANTOPRAZOLE (PROTONIX) 40 MG TABLET    TAKE ONE TABLET TWICE DAILY  TETRAHYDROZOLINE HCL (VISINE OP)    Place 2 drops into both eyes as needed (for dry eyes).   ZOLPIDEM (AMBIEN) 10 MG TABLET    Take 1 tablet (10 mg total) by mouth at bedtime as needed for sleep.  Modified Medications   No medications on file  Discontinued Medications   No medications on file    Review of Systems  Unable to perform ROS: Psychiatric disorder    Vitals:   03/15/16 1031  BP: (!) 142/72  Pulse: (!) 57  Temp: 97.9 F (36.6 C)  TempSrc: Oral  SpO2: 97%  Weight: 153 lb (69.4 kg)  Height: '5\' 8"'$  (1.727 m)   Body mass index is 23.26 kg/m.  Physical Exam  Constitutional: She appears well-developed and well-nourished.  HENT:  Mouth/Throat: Oropharynx is clear and moist. No oropharyngeal exudate.  Eyes: Pupils are equal, round, and reactive to light. No scleral icterus.  Neck: Neck supple. Carotid bruit is not present. No tracheal deviation present. No thyromegaly present.  Cardiovascular: Normal rate, regular rhythm and intact distal pulses.  Exam reveals no gallop and no friction rub.   Murmur (1/6 SEM) heard. No LE edema b/l. no calf TTP.   Pulmonary/Chest: Effort normal and breath sounds normal. No stridor. No respiratory distress. She has no wheezes. She has no rales.  Abdominal: Soft. Bowel sounds are normal. She exhibits no distension and no mass. There is no hepatomegaly. There is no tenderness. There is no rebound, no guarding and negative Murphy's sign.  Musculoskeletal: She exhibits edema and tenderness.  Lymphadenopathy:    She has no cervical adenopathy.  Neurological: She is alert.  Skin: Skin is warm and dry. No rash noted.  Psychiatric: Her behavior is normal. Her mood appears anxious. She exhibits  a depressed mood.     Labs reviewed: Office Visit on 03/07/2016  Component Date Value Ref Range Status  . WBC 03/07/2016 6.6  3.8 - 10.8 K/uL Final  . RBC 03/07/2016 3.97  3.80 - 5.10 MIL/uL Final  . Hemoglobin 03/07/2016 11.8  11.7 - 15.5 g/dL Final  . HCT 03/07/2016 36.4  35.0 - 45.0 % Final  . MCV 03/07/2016 91.7  80.0 - 100.0 fL Final  . MCH 03/07/2016 29.7  27.0 - 33.0 pg Final  . MCHC 03/07/2016 32.4  32.0 - 36.0 g/dL Final  . RDW 03/07/2016 13.6  11.0 - 15.0 % Final  . Platelets 03/07/2016 234  140 - 400 K/uL Final  . MPV 03/07/2016 9.9  7.5 - 12.5 fL Final  . Neutro Abs 03/07/2016 4092  1,500 - 7,800 cells/uL Final  . Lymphs Abs 03/07/2016 1518  850 - 3,900 cells/uL Final  . Monocytes Absolute 03/07/2016 594  200 - 950 cells/uL Final  . Eosinophils Absolute 03/07/2016 330  15 - 500 cells/uL Final  . Basophils Absolute 03/07/2016 66  0 - 200 cells/uL Final  . Neutrophils Relative % 03/07/2016 62  % Final  . Lymphocytes Relative 03/07/2016 23  % Final  . Monocytes Relative 03/07/2016 9  % Final  . Eosinophils Relative 03/07/2016 5  % Final  . Basophils Relative 03/07/2016 1  % Final  . Smear Review 03/07/2016 Criteria for review not met   Final  . Sodium 03/08/2016 140  135 - 146 mmol/L Final  . Potassium 03/08/2016 3.8  3.5 - 5.3 mmol/L Final  . Chloride 03/08/2016 102  98 - 110 mmol/L Final  . CO2 03/08/2016 25  20 - 31 mmol/L Final  . Glucose, Bld  03/08/2016 181* 65 - 99 mg/dL Final  . BUN 03/08/2016 14  7 - 25 mg/dL Final  . Creat 03/08/2016 0.54* 0.60 - 0.88 mg/dL Final   Comment:   For patients > or = 80 years of age: The upper reference limit for Creatinine is approximately 13% higher for people identified as African-American.     . Total Bilirubin 03/08/2016 0.7  0.2 - 1.2 mg/dL Final  . Alkaline Phosphatase 03/08/2016 155* 33 - 130 U/L Final  . AST 03/08/2016 34  10 - 35 U/L Final  . ALT 03/08/2016 96* 6 - 29 U/L Final  . Total Protein 03/08/2016 6.3  6.1 -  8.1 g/dL Final  . Albumin 03/08/2016 3.5* 3.6 - 5.1 g/dL Final  . Calcium 03/08/2016 9.1  8.6 - 10.4 mg/dL Final  . GFR, Est African American 03/08/2016 >89  >=60 mL/min Final  . GFR, Est Non African American 03/08/2016 85  >=60 mL/min Final  . Lipase 03/08/2016 34  7 - 60 U/L Final  . Amylase 03/08/2016 57  0 - 105 U/L Final  Appointment on 01/29/2016  Component Date Value Ref Range Status  . Cholesterol 01/29/2016 175  125 - 200 mg/dL Final  . Triglycerides 01/29/2016 111  <150 mg/dL Final  . HDL 01/29/2016 50  >=46 mg/dL Final  . Total CHOL/HDL Ratio 01/29/2016 3.5  <=5.0 Ratio Final  . VLDL 01/29/2016 22  <30 mg/dL Final  . LDL Cholesterol 01/29/2016 103  <130 mg/dL Final   Comment:   Total Cholesterol/HDL Ratio:CHD Risk                        Coronary Heart Disease Risk Table                                        Men       Women          1/2 Average Risk              3.4        3.3              Average Risk              5.0        4.4           2X Average Risk              9.6        7.1           3X Average Risk             23.4       11.0 Use the calculated Patient Ratio above and the CHD Risk table  to determine the patient's CHD Risk.   Marland Kitchen ALT 01/29/2016 12  6 - 29 U/L Final  Appointment on 12/27/2015  Component Date Value Ref Range Status  . Cholesterol 12/27/2015 228* 125 - 200 mg/dL Final  . Triglycerides 12/27/2015 142  <150 mg/dL Final  . HDL 12/27/2015 62  >=46 mg/dL Final  . Total CHOL/HDL Ratio 12/27/2015 3.7  <=5.0 Ratio Final  . VLDL 12/27/2015 28  <30 mg/dL Final  . LDL Cholesterol 12/27/2015 138* <130 mg/dL Final   Comment:   Total Cholesterol/HDL Ratio:CHD Risk  Coronary Heart Disease Risk Table                                        Men       Women          1/2 Average Risk              3.4        3.3              Average Risk              5.0        4.4           2X Average Risk              9.6        7.1           3X Average  Risk             23.4       11.0 Use the calculated Patient Ratio above and the CHD Risk table  to determine the patient's CHD Risk.   . Sodium 12/27/2015 139  135 - 146 mmol/L Final  . Potassium 12/27/2015 4.4  3.5 - 5.3 mmol/L Final  . Chloride 12/27/2015 102  98 - 110 mmol/L Final  . CO2 12/27/2015 28  20 - 31 mmol/L Final  . Glucose, Bld 12/27/2015 77  65 - 99 mg/dL Final  . BUN 12/27/2015 12  7 - 25 mg/dL Final  . Creat 12/27/2015 0.70  0.60 - 0.88 mg/dL Final   Comment:   For patients > or = 80 years of age: The upper reference limit for Creatinine is approximately 13% higher for people identified as African-American.     . Calcium 12/27/2015 9.1  8.6 - 10.4 mg/dL Final  . GFR, Est African American 12/27/2015 >89  >=60 mL/min Final  . GFR, Est Non African American 12/27/2015 78  >=60 mL/min Final  . ALT 12/27/2015 11  6 - 29 U/L Final    US Abdomen Limited Ruq  Result Date: 03/13/2016 CLINICAL DATA:  Elevated transaminases.  Cholecystectomy. EXAM: US ABDOMEN LIMITED - RIGHT UPPER QUADRANT COMPARISON:  09/01/2015 FINDINGS: Gallbladder: Cholecystectomy. Common bile duct: Diameter: 5 mm. Liver: No focal lesion identified. Within normal limits in parenchymal echogenicity. Antegrade flow in the imaged portal venous system. IMPRESSION: Unremarkable right upper quadrant ultrasound after cholecystectomy. Electronically Signed   By: Monte Fantasia M.D.   On: 03/13/2016 09:35     Assessment/Plan   ICD-9-CM ICD-10-CM   1. 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 Elevated liver enzymes 790.5 R74.8 Hepatic Function Panel  3. Nausea without vomiting 787.02 R11.0   4. Insomnia, unspecified type 780.52 G47.00 zolpidem (AMBIEN) 10 MG tablet   Her sx's have improved with avoidance of fatty foods. Creon not indicated at this time. Will follow  Cont current meds as ordered   Cont avoiding fatty foods  Repeat hepatic fxn to follow ALT  F/u as scheduled for [place].  Perlie Gold  [facility] [address] [place], [place] [address] [phone] Cell (Monday-Friday 8 AM - 5 PM) [phone] After 5 PM and follow prompts

## 2016-03-26 ENCOUNTER — Telehealth: Payer: Self-pay

## 2016-03-26 ENCOUNTER — Other Ambulatory Visit: Payer: Medicare Other

## 2016-03-26 NOTE — Telephone Encounter (Signed)
She does not need any labs. please cancel lab appt

## 2016-03-26 NOTE — Telephone Encounter (Signed)
Patient is scheduled for lab appointment on 03/27/16 and according to the 12/29/15 OV note, patient was to return in 3 months with labs before. Labs that are listed are BMP, ALT, lipid. Patient has had both a lipid and alt done on 01/29/16 and 12/27/15.   Please advise if these labs are still needed since patient's insurance will only cover a lipid panel  every 91 days.

## 2016-03-26 NOTE — Telephone Encounter (Signed)
Called and spoke to pt about the AWV and how it was a part of her care. Pt is now re-scheduled to come in at 2pm on Thursday 03/28/16 to see me.

## 2016-03-26 NOTE — Telephone Encounter (Signed)
Called patient to cancel lab appointment that is scheduled tomorrow. While on the phone, patient asked to cancel her visit with wellness nurse. She stated she would reschedule if coming to that appointment was necessary.

## 2016-03-27 ENCOUNTER — Other Ambulatory Visit: Payer: Medicare Other

## 2016-03-27 ENCOUNTER — Ambulatory Visit: Payer: Medicare Other

## 2016-03-28 ENCOUNTER — Other Ambulatory Visit: Payer: Self-pay | Admitting: Internal Medicine

## 2016-03-28 ENCOUNTER — Ambulatory Visit: Payer: Self-pay

## 2016-03-29 ENCOUNTER — Ambulatory Visit: Payer: Medicare Other | Admitting: Internal Medicine

## 2016-04-05 ENCOUNTER — Other Ambulatory Visit: Payer: Self-pay | Admitting: Internal Medicine

## 2016-04-10 ENCOUNTER — Other Ambulatory Visit: Payer: Self-pay | Admitting: Nurse Practitioner

## 2016-04-25 ENCOUNTER — Other Ambulatory Visit: Payer: Self-pay | Admitting: Internal Medicine

## 2016-05-01 ENCOUNTER — Ambulatory Visit (INDEPENDENT_AMBULATORY_CARE_PROVIDER_SITE_OTHER): Payer: Medicare Other

## 2016-05-01 VITALS — BP 152/70 | HR 53 | Temp 97.4°F | Ht 68.0 in | Wt 152.2 lb

## 2016-05-01 DIAGNOSIS — Z0000 Encounter for general adult medical examination without abnormal findings: Secondary | ICD-10-CM

## 2016-05-01 NOTE — Progress Notes (Signed)
Subjective:   Marissa Bowen is a 83 y.o. female who presents for Medicare Annual (Subsequent) preventive examination.  Review of Systems:  Cardiac Risk Factors include: advanced age (>53men, >85 women);sedentary lifestyle;family history of premature cardiovascular disease;hypertension     Objective:     Vitals: BP (!) 152/70 (BP Location: Right Arm, Patient Position: Sitting, Cuff Size: Normal)   Pulse (!) 53   Temp 97.4 F (36.3 C) (Oral)   Ht 5\' 8"  (1.727 m)   Wt 152 lb 3.2 oz (69 kg)   SpO2 99%   BMI 23.14 kg/m   Body mass index is 23.14 kg/m.   Tobacco History  Smoking Status  . Never Smoker  Smokeless Tobacco  . Never Used     Counseling given: No   Past Medical History:  Diagnosis Date  . Anemia   . Anxiety   . BACK PAIN, LUMBAR 09/27/2009  . CAD (coronary artery disease)    a. s/p remote CABG 1997, 3V.;  b. LexiScan Myoview (8/15): No ischemia, EF 72%, normal study;  c. Echo 12/13 - EF 60-65%, no significant valvular abnormalities, normal RV size and systolic function.   . CVA (cerebral infarction)    a. Thalamic infarct 09/2009. //  b. hx of TIA in 2008  . Gastric ulcer   . GERD 11/25/2006  . GI bleed    a. 09/2009: secondary to antral ulcer.  . Hiatal hernia   . History of diverticulitis Dx 02/2012  . History of MI (myocardial infarction)   . HYPERLIPIDEMIA   . HYPERTENSION   . IBS 12/07/2008  . MIGRAINE WITH AURA 08/13/2007  . Mild carotid artery disease ([place])    a. 0-39% bilat 05/2011 (f/u recommended 05/2013). //  b. Carotid US 3/17 - Bilat ICA 1-39% >> FU prn  . Muscle weakness (generalized) 09/27/2009  . OSTEOARTHRITIS 08/13/2007  . PAF (paroxysmal atrial fibrillation) ([place]) 11/25/2006   a. Multaq >> changes to Amiodarone 2/2 $$  //  Eliquis (CHADS2-VASc = 6)  . Urinary, incontinence, stress female    wears Pessary   Past Surgical History:  Procedure Laterality Date  . ABDOMINAL HYSTERECTOMY  1973  . CATARACT EXTRACTION W/ INTRAOCULAR LENS   IMPLANT, BILATERAL Bilateral 1999  . CHOLECYSTECTOMY N/A 01/03/2015   Procedure: LAPAROSCOPIC CHOLECYSTECTOMY WITH INTRAOPERATIVE CHOLANGIOGRAM;  Surgeon: Donnie Mesa, MD;  Location: [facility];  Service: General;  Laterality: N/A;  . CORONARY ANGIOPLASTY  1997   Dr Lia Foyer  . CORONARY ARTERY BYPASS GRAFT  1997   Dr Melvenia Needles  . LAPAROSCOPIC CHOLECYSTECTOMY  01/03/2015  . TONSILLECTOMY     Family History  Problem Relation Age of Onset  . Heart attack Mother   . Heart failure Father   . Throat cancer Sister   . Heart disease Sister   . Coronary artery disease Sister   . Diabetes Sister   . Heart attack Sister   . Colon cancer Neg Hx    History  Sexual Activity  . Sexual activity: No    Outpatient Encounter Prescriptions as of 05/01/2016  Medication Sig  . acetaminophen (TYLENOL) 500 MG tablet Take 500-1,000 mg by mouth every 6 (six) hours as needed for moderate pain.   Marland Kitchen amiodarone (PACERONE) 200 MG tablet TAKE ONE-HALF TABLET DAILY  . bismuth subsalicylate (PEPTO BISMOL) 262 MG/15ML suspension Take 30 mLs by mouth every 6 (six) hours as needed for indigestion.  Marland Kitchen ELIQUIS 5 MG TABS tablet TAKE ONE TABLET TWICE DAILY  . estradiol (ESTRACE) 0.1 MG/GM  vaginal cream Place 1 Applicatorful vaginally 2 (two) times a week.  Marland Kitchen LORazepam (ATIVAN) 1 MG tablet TAKE ONE TABLET EVERY EIGHT HOURS AS NEEDED FOR ANXIETY  . losartan (COZAAR) 50 MG tablet Take 1 tablet (50 mg total) by mouth 2 (two) times daily.  . meclizine (ANTIVERT) 12.5 MG tablet TAKE ONE OR TWO TABLETS EVERY EIGHT HOURS AS NEEDED FOR DIZZINESS  . metoprolol succinate (TOPROL-XL) 25 MG 24 hr tablet TAKE ONE-HALF TABLET DAILY  . Misc. Devices (RING PESSARY) MISC Place vaginally.  . Multiple Vitamin (MULTIVITAMIN) tablet Take 1 tablet by mouth daily.    . Multiple Vitamins-Minerals (PRESERVISION AREDS) TABS Take 1 tab by mouth in the morning, 1 tablet by mouth in the evening  . niacin 500 MG CR capsule Take 500 mg by mouth 2 (two) times  daily.   . ondansetron (ZOFRAN) 4 MG tablet Take 1 tablet (4 mg total) by mouth every 8 (eight) hours as needed for nausea or vomiting.  . pantoprazole (PROTONIX) 40 MG tablet TAKE ONE TABLET TWICE DAILY  . Tetrahydrozoline HCl (VISINE OP) Place 2 drops into both eyes as needed (for dry eyes).  Marland Kitchen zolpidem (AMBIEN) 10 MG tablet Take 1 tablet (10 mg total) by mouth at bedtime as needed for sleep.  . [DISCONTINUED] fluticasone (FLONASE) 50 MCG/ACT nasal spray Place 2 sprays into both nostrils daily.  . [DISCONTINUED] nitrofurantoin, macrocrystal-monohydrate, (MACROBID) 100 MG capsule    No facility-administered encounter medications on file as of 05/01/2016.     Activities of Daily Living In your present state of health, do you have any difficulty performing the following activities: 05/01/2016  Hearing? N  Vision? N  Difficulty concentrating or making decisions? N  Walking or climbing stairs? N  Dressing or bathing? N  Doing errands, shopping? N  Preparing Food and eating ? N  Using the Toilet? N  In the past six months, have you accidently leaked urine? Y  Do you have problems with loss of bowel control? Y  Managing your Medications? N  Managing your Finances? N  Housekeeping or managing your Housekeeping? N  Some recent data might be hidden    Patient Care Team: Gildardo Cranker, DO as PCP - General (Internal Medicine) Hillary Bow, MD (Cardiology) Melissa Montane, MD as Consulting Physician (Otolaryngology) Arvella Nigh, MD as Consulting Physician (Obstetrics and Gynecology) Rutherford Guys, MD as Consulting Physician (Ophthalmology)    Assessment:    Exercise Activities and Dietary recommendations Current Exercise Habits: The patient does not participate in regular exercise at present, Exercise limited by: None identified  Goals    . <enter goal here>          Starting 05/01/2016, I will maintain my current lifestyle.       Fall Risk Fall Risk  05/01/2016 03/15/2016 12/29/2015  09/22/2015 08/23/2015  Falls in the past year? - Yes No Yes No  Number falls in past yr: 1 2 or more - 2 or more -  Injury with Fall? Yes No - No -  Risk for fall due to : Impaired balance/gait - - - -   Depression Screen PHQ 2/9 Scores 05/01/2016 03/15/2016 11/09/2014 10/07/2014  PHQ - 2 Score - 0 0 0  PHQ- 9 Score - - - -  Exception Documentation Other- indicate reason in comment box - - -  Not completed Pt does have some depression; today pt's sister is being taken off life support and she is struggling with not being able to be there for  her (sister in [place].)  - - -     Cognitive Function MMSE - Mini Mental State Exam 05/01/2016 09/29/2013  Orientation to time 5 5  Orientation to Place 5 5  Registration 3 3  Attention/ Calculation 5 5  Recall 3 2  Language- name 2 objects 2 2  Language- repeat 1 1  Language- follow 3 step command 3 3  Language- read & follow direction 1 1  Write a sentence 1 1  Copy design 1 1  Total score 30 -        Immunization History  Administered Date(s) Administered  . Influenza Split 02/05/2011, 02/13/2012, 02/02/2013  . Influenza Whole 02/11/2008  . Influenza,inj,Quad PF,36+ Mos 04/11/2014, 01/04/2015, 12/29/2015  . Pneumococcal Conjugate-13 09/29/2013   Screening Tests Health Maintenance  Topic Date Due  . TETANUS/TDAP  09/19/2016 (Originally 10/28/1945)  . ZOSTAVAX  09/21/2016 (Originally 10/27/1988)  . PNA vac Low Risk Adult (2 of 2 - PPSV23) 09/21/2016 (Originally 09/30/2014)  . INFLUENZA VACCINE  Completed  . DEXA SCAN  Completed      Plan:    I have personally reviewed and addressed the Medicare Annual Wellness questionnaire and have noted the following in the patient's chart:  A. Medical and social history B. Use of alcohol, tobacco or illicit drugs  C. Current medications and supplements D. Functional ability and status E.  Nutritional status F.  Physical activity G. Advance directives H. List of other physicians I.  Hospitalizations,  surgeries, and ER visits in previous 12 months J.  [place] to include hearing, vision, cognitive, depression L. Referrals and appointments - none  In addition, I have reviewed and discussed with patient certain preventive protocols, quality metrics, and best practice recommendations. A written personalized care plan for preventive services as well as general preventive health recommendations were provided to patient.  See attached scanned questionnaire for additional information.   Signed,   Allyn Kenner, LPN Health Advisor  I have reviewed the health advisor's note and was available for consultation. I agree with documentation and plan.   Denarius Sesler S. Perlie Gold  [facility] and Adult Medicine [address] [place], [place] [address] [phone] Cell (Monday-Friday 8 AM - 5 PM) [phone] After 5 PM and follow prompts

## 2016-05-01 NOTE — Progress Notes (Signed)
Quick Notes   Health Maintenance:  Up to date    Abnormal Screen: MMSE-30/30 Failed Clock Test   Patient Concerns:  Pt has had right knee pain for the past several months, concerned it might be arthritis. Pt also has some slight depression; today they are taking her sister off life support and she is unable to be there. Pt also states that her feet ache when she first gets up in the morning. Suggested sitting on the edge the bed and flex back and forth, prior to getting up.     Nurse Concerns:  None;

## 2016-05-01 NOTE — Patient Instructions (Addendum)
Marissa Bowen , Thank you for taking time to come for your Medicare Wellness Visit. I appreciate your ongoing commitment to your health goals. Please review the following plan we discussed and let me know if I can assist you in the future.   These are the goals we discussed: Goals    . <enter goal here>          Starting 05/01/2016, I will maintain my current lifestyle.        This is a list of the screening recommended for you and due dates:  Health Maintenance  Topic Date Due  . Tetanus Vaccine  09/19/2016*  . Shingles Vaccine  09/21/2016*  . Pneumonia vaccines (2 of 2 - PPSV23) 09/21/2016*  . Flu Shot  Completed  . DEXA scan (bone density measurement)  Completed  *Topic was postponed. The date shown is not the original due date.  Preventive Care for Adults  A healthy lifestyle and preventive care can promote health and wellness. Preventive health guidelines for adults include the following key practices.  . A routine yearly physical is a good way to check with your health care provider about your health and preventive screening. It is a chance to share any concerns and updates on your health and to receive a thorough exam.  . Visit your dentist for a routine exam and preventive care every 6 months. Brush your teeth twice a day and floss once a day. Good oral hygiene prevents tooth decay and gum disease.  . The frequency of eye exams is based on your age, health, family medical history, use  of contact lenses, and other factors. Follow your health care provider's ecommendations for frequency of eye exams.  . Eat a healthy diet. Foods like vegetables, fruits, whole grains, low-fat dairy products, and lean protein foods contain the nutrients you need without too many calories. Decrease your intake of foods high in solid fats, added sugars, and salt. Eat the right amount of calories for you. Get information about a proper diet from your health care provider, if necessary.  . Regular  physical exercise is one of the most important things you can do for your health. Most adults should get at least 150 minutes of moderate-intensity exercise (any activity that increases your heart rate and causes you to sweat) each week. In addition, most adults need muscle-strengthening exercises on 2 or more days a week.  Silver Sneakers may be a benefit available to you. To determine eligibility, you may visit the website: www.silversneakers.com or contact program at (901)619-3787 Mon-Fri between 8AM-8PM.   . Maintain a healthy weight. The body mass index (BMI) is a screening tool to identify possible weight problems. It provides an estimate of body fat based on height and weight. Your health care provider can find your BMI and can help you achieve or maintain a healthy weight.   For adults 20 years and older: ? A BMI below 18.5 is considered underweight. ? A BMI of 18.5 to 24.9 is normal. ? A BMI of 25 to 29.9 is considered overweight. ? A BMI of 30 and above is considered obese.   . Maintain normal blood lipids and cholesterol levels by exercising and minimizing your intake of saturated fat. Eat a balanced diet with plenty of fruit and vegetables. Blood tests for lipids and cholesterol should begin at age 73 and be repeated every 5 years. If your lipid or cholesterol levels are high, you are over 50, or you are at high risk for  heart disease, you may need your cholesterol levels checked more frequently. Ongoing high lipid and cholesterol levels should be treated with medicines if diet and exercise are not working.  . If you smoke, find out from your health care provider how to quit. If you do not use tobacco, please do not start.  . If you choose to drink alcohol, please do not consume more than 2 drinks per day. One drink is considered to be 12 ounces (355 mL) of beer, 5 ounces (148 mL) of wine, or 1.5 ounces (44 mL) of liquor.  . If you are 60-64 years old, ask your health care provider if  you should take aspirin to prevent strokes.  . Use sunscreen. Apply sunscreen liberally and repeatedly throughout the day. You should seek shade when your shadow is shorter than you. Protect yourself by wearing long sleeves, pants, a wide-brimmed hat, and sunglasses year round, whenever you are outdoors.  . Once a month, do a whole body skin exam, using a mirror to look at the skin on your back. Tell your health care provider of new moles, moles that have irregular borders, moles that are larger than a pencil eraser, or moles that have changed in shape or color.

## 2016-05-03 ENCOUNTER — Ambulatory Visit: Payer: Self-pay | Admitting: Internal Medicine

## 2016-05-04 ENCOUNTER — Emergency Department (HOSPITAL_COMMUNITY)
Admission: EM | Admit: 2016-05-04 | Discharge: 2016-05-04 | Disposition: A | Discharge status: Home / Self Care | Payer: Medicare Other | Attending: Emergency Medicine | Admitting: Emergency Medicine

## 2016-05-04 ENCOUNTER — Encounter (HOSPITAL_COMMUNITY): Payer: Self-pay | Admitting: Emergency Medicine

## 2016-05-04 DIAGNOSIS — Z955 Presence of coronary angioplasty implant and graft: Secondary | ICD-10-CM | POA: Insufficient documentation

## 2016-05-04 DIAGNOSIS — I4891 Unspecified atrial fibrillation: Secondary | ICD-10-CM | POA: Insufficient documentation

## 2016-05-04 DIAGNOSIS — Z8673 Personal history of transient ischemic attack (TIA), and cerebral infarction without residual deficits: Secondary | ICD-10-CM | POA: Diagnosis not present

## 2016-05-04 DIAGNOSIS — I252 Old myocardial infarction: Secondary | ICD-10-CM | POA: Diagnosis not present

## 2016-05-04 DIAGNOSIS — I10 Essential (primary) hypertension: Secondary | ICD-10-CM | POA: Insufficient documentation

## 2016-05-04 DIAGNOSIS — Z951 Presence of aortocoronary bypass graft: Secondary | ICD-10-CM | POA: Diagnosis not present

## 2016-05-04 DIAGNOSIS — Z7901 Long term (current) use of anticoagulants: Secondary | ICD-10-CM | POA: Diagnosis not present

## 2016-05-04 DIAGNOSIS — R000 Tachycardia, unspecified: Secondary | ICD-10-CM | POA: Diagnosis not present

## 2016-05-04 DIAGNOSIS — R531 Weakness: Secondary | ICD-10-CM | POA: Diagnosis not present

## 2016-05-04 DIAGNOSIS — Z79899 Other long term (current) drug therapy: Secondary | ICD-10-CM | POA: Insufficient documentation

## 2016-05-04 DIAGNOSIS — I2510 Atherosclerotic heart disease of native coronary artery without angina pectoris: Secondary | ICD-10-CM | POA: Insufficient documentation

## 2016-05-04 LAB — CBC WITH DIFFERENTIAL/PLATELET
BASOS PCT: 1 %
Basophils Absolute: 0 10*3/uL (ref 0.0–0.1)
EOS ABS: 0.1 10*3/uL (ref 0.0–0.7)
Eosinophils Relative: 2 %
HCT: 42.5 % (ref 36.0–46.0)
Hemoglobin: 13.8 g/dL (ref 12.0–15.0)
Lymphocytes Relative: 19 %
Lymphs Abs: 1.1 10*3/uL (ref 0.7–4.0)
MCH: 29.6 pg (ref 26.0–34.0)
MCHC: 32.5 g/dL (ref 30.0–36.0)
MCV: 91.2 fL (ref 78.0–100.0)
MONO ABS: 0.6 10*3/uL (ref 0.1–1.0)
MONOS PCT: 10 %
NEUTROS PCT: 68 %
Neutro Abs: 4.1 10*3/uL (ref 1.7–7.7)
PLATELETS: 215 10*3/uL (ref 150–400)
RBC: 4.66 MIL/uL (ref 3.87–5.11)
RDW: 13.1 % (ref 11.5–15.5)
WBC: 6 10*3/uL (ref 4.0–10.5)

## 2016-05-04 LAB — BASIC METABOLIC PANEL
Anion gap: 9 (ref 5–15)
BUN: 11 mg/dL (ref 6–20)
CALCIUM: 9.4 mg/dL (ref 8.9–10.3)
CO2: 28 mmol/L (ref 22–32)
CREATININE: 0.69 mg/dL (ref 0.44–1.00)
Chloride: 101 mmol/L (ref 101–111)
GFR calc non Af Amer: >60 mL/min (ref >60)
Glucose, Bld: 151 mg/dL — ABNORMAL HIGH (ref 65–99)
Potassium: 3.9 mmol/L (ref 3.5–5.1)
SODIUM: 138 mmol/L (ref 135–145)

## 2016-05-04 LAB — MAGNESIUM: MAGNESIUM: 1.9 mg/dL (ref 1.7–2.4)

## 2016-05-04 MED ORDER — APIXABAN 5 MG PO TABS
5.0000 mg | ORAL_TABLET | Freq: Once | ORAL | Status: AC
  Administered 2016-05-04: 5 mg via ORAL
  Filled 2016-05-04: qty 1

## 2016-05-04 MED ORDER — SODIUM CHLORIDE 0.9 % IV BOLUS (SEPSIS)
1000.0000 mL | Freq: Once | INTRAVENOUS | Status: AC
  Administered 2016-05-04: 1000 mL via INTRAVENOUS

## 2016-05-04 MED ORDER — AMIODARONE HCL 200 MG PO TABS
200.0000 mg | ORAL_TABLET | Freq: Every day | ORAL | Status: DC
  Administered 2016-05-04: 200 mg via ORAL
  Filled 2016-05-04: qty 1

## 2016-05-04 NOTE — ED Triage Notes (Signed)
Pt here from home with c/o afib , no chest pain . Pt rate is 70 to 130 does increase with her anxiety ,

## 2016-05-04 NOTE — ED Provider Notes (Signed)
[facility] DEPT Provider Note   CSN: [id number] Arrival date & time: 05/04/16  1202     History   Chief Complaint Chief Complaint  Patient presents with  . Atrial Fibrillation    HPI Marissa Bowen is a 83 y.o. female.  HPI 83 year old female with a history of A. Fib on metoprolol, amiodarone, Eliquis presents to the ED for tachycardia. Patient reports not taking her medicine this morning; however her symptoms were noted to that. They have been persistent since onset. No alleviating or aggravating factors. Denies any recent fevers, illnesses, or infections. Denies any current chest pain, shortness of breath, dizziness, nausea, vomiting, diarrhea, urinary symptoms. On EMS arrival to her home, they noted a heart rate in the 150s. No intervention in route.  Patient denies any other physical complaints.  Past Medical History:  Diagnosis Date  . Anemia   . Anxiety   . BACK PAIN, LUMBAR 09/27/2009  . CAD (coronary artery disease)    a. s/p remote CABG 1997, 3V.;  b. LexiScan Myoview (8/15): No ischemia, EF 72%, normal study;  c. Echo 12/13 - EF 60-65%, no significant valvular abnormalities, normal RV size and systolic function.   . CVA (cerebral infarction)    a. Thalamic infarct 09/2009. //  b. hx of TIA in 2008  . Gastric ulcer   . GERD 11/25/2006  . GI bleed    a. 09/2009: secondary to antral ulcer.  . Hiatal hernia   . History of diverticulitis Dx 02/2012  . History of MI (myocardial infarction)   . HYPERLIPIDEMIA   . HYPERTENSION   . IBS 12/07/2008  . MIGRAINE WITH AURA 08/13/2007  . Mild carotid artery disease ([place])    a. 0-39% bilat 05/2011 (f/u recommended 05/2013). //  b. Carotid US 3/17 - Bilat ICA 1-39% >> FU prn  . Muscle weakness (generalized) 09/27/2009  . OSTEOARTHRITIS 08/13/2007  . PAF (paroxysmal atrial fibrillation) ([place]) 11/25/2006   a. Multaq >> changes to Amiodarone 2/2 $$  //  Eliquis (CHADS2-VASc = 6)  . Urinary, incontinence, stress female    wears Pessary     Patient Active Problem List   Diagnosis Date Noted  . Macular degeneration of both eyes 09/22/2015  . Carotid stenosis 08/10/2015  . On amiodarone therapy 08/10/2015  . Chronic cholecystitis with calculus 01/03/2015  . Allergic rhinitis 08/05/2014  . Essential hypertension 05/03/2014  . Osteoarthritis 09/29/2013  . Routine general medical examination at a health care facility 09/29/2013  . Nausea alone 08/03/2013  . Depression with anxiety 08/03/2013  . Atrial fibrillation ([place]) 09/02/2012  . Anemia due to chronic blood loss 08/18/2012  . Intractable nausea and vomiting 06/08/2012  . Diarrhea 06/08/2012  . Leukocytosis 06/08/2012  . Orthostatic hypotension 06/08/2012  . PALPITATIONS, HX OF 01/04/2010  . ACUTE POSTHEMORRHAGIC ANEMIA 10/23/2009  . BACK PAIN, LUMBAR 09/27/2009  . MUSCLE WEAKNESS (GENERALIZED) 09/27/2009  . IBS 12/07/2008  . Fatigue 07/21/2008  . MIGRAINE WITH AURA 08/13/2007  . OSTEOARTHRITIS 08/13/2007  . TRANSIENT ISCHEMIC ATTACK 04/13/2007  . Hyperlipidemia LDL goal <100 11/25/2006  . CAD (coronary artery disease) 11/25/2006  . GERD 11/25/2006  . DIVERTICULITIS, HX OF 11/25/2006    Past Surgical History:  Procedure Laterality Date  . ABDOMINAL HYSTERECTOMY  1973  . CATARACT EXTRACTION W/ INTRAOCULAR LENS  IMPLANT, BILATERAL Bilateral 1999  . CHOLECYSTECTOMY N/A 01/03/2015   Procedure: LAPAROSCOPIC CHOLECYSTECTOMY WITH INTRAOPERATIVE CHOLANGIOGRAM;  Surgeon: Donnie Mesa, MD;  Location: [facility];  Service: General;  Laterality: N/A;  .  CORONARY ANGIOPLASTY  1997   Dr Lia Foyer  . CORONARY ARTERY BYPASS GRAFT  1997   Dr Melvenia Needles  . LAPAROSCOPIC CHOLECYSTECTOMY  01/03/2015  . TONSILLECTOMY      OB History    No data available       Home Medications    Prior to Admission medications   Medication Sig Start Date End Date Taking? Authorizing Provider  acetaminophen (TYLENOL) 500 MG tablet Take 500-1,000 mg by mouth every 6 (six) hours as needed for  moderate pain.    Yes Historical Provider, MD  amiodarone (PACERONE) 200 MG tablet TAKE ONE-HALF TABLET DAILY 02/29/16  Yes Gildardo Cranker, DO  ELIQUIS 5 MG TABS tablet TAKE ONE TABLET TWICE DAILY 10/19/15  Yes Liliane Shi, PA-C  LORazepam (ATIVAN) 1 MG tablet TAKE ONE TABLET EVERY EIGHT HOURS AS NEEDED FOR ANXIETY 04/10/16  Yes Gildardo Cranker, DO  losartan (COZAAR) 50 MG tablet Take 1 tablet (50 mg total) by mouth 2 (two) times daily. 12/20/15  Yes Larey Dresser, MD  meclizine (ANTIVERT) 12.5 MG tablet TAKE ONE OR TWO TABLETS EVERY EIGHT HOURS AS NEEDED FOR DIZZINESS Patient taking differently: TAKE ONE OR TWO TABLETS twice daily AS NEEDED FOR DIZZINESS 04/26/16  Yes Gildardo Cranker, DO  metoprolol succinate (TOPROL-XL) 25 MG 24 hr tablet TAKE ONE-HALF TABLET DAILY 03/28/16  Yes Gildardo Cranker, DO  Multiple Vitamin (MULTIVITAMIN) tablet Take 1 tablet by mouth daily.     Yes Historical Provider, MD  Multiple Vitamins-Minerals (PRESERVISION AREDS) TABS Take 1 tablet by mouth 2 (two) times daily.    Yes Historical Provider, MD  niacin 500 MG CR capsule Take 500 mg by mouth 2 (two) times daily.    Yes Historical Provider, MD  sodium chloride (OCEAN) 0.65 % SOLN nasal spray Place 1 spray into both nostrils as needed for congestion.   Yes Historical Provider, MD  Tetrahydrozoline HCl (VISINE OP) Place 2 drops into both eyes as needed (for dry eyes).   Yes Historical Provider, MD  zolpidem (AMBIEN) 10 MG tablet Take 1 tablet (10 mg total) by mouth at bedtime as needed for sleep. 03/15/16  Yes Monica Carter, DO  ondansetron (ZOFRAN) 4 MG tablet Take 1 tablet (4 mg total) by mouth every 8 (eight) hours as needed for nausea or vomiting. Patient not taking: Reported on 05/04/2016 03/07/16   Tiffany L Reed, DO  pantoprazole (PROTONIX) 40 MG tablet TAKE ONE TABLET TWICE DAILY Patient not taking: Reported on 05/04/2016 02/29/16   Gildardo Cranker, DO    Family History Family History  Problem Relation Age of Onset  .  Heart attack Mother   . Heart failure Father   . Throat cancer Sister   . Heart disease Sister   . Coronary artery disease Sister   . Diabetes Sister   . Heart attack Sister   . Colon cancer Neg Hx     Social History Social History  Substance Use Topics  . Smoking status: Never Smoker  . Smokeless tobacco: Never Used  . Alcohol use No     Allergies   Asa [aspirin]; Crestor [rosuvastatin calcium]; Hydrocodone; Ibuprofen; Oxycodone hcl; Penicillins; Prednisone; Statins; and Pristiq [desvenlafaxine]   Review of Systems Review of Systems Ten systems are reviewed and are negative for acute change except as noted in the HPI   Physical Exam Updated Vital Signs BP 154/89   Pulse 123  Temp 97.9 F (36.6 C) (Oral)   Resp 20   SpO2 96%   Physical Exam  Constitutional: She is oriented to person, place, and time. She appears well-developed and well-nourished. No distress.  HENT:  Head: Normocephalic and atraumatic.  Nose: Nose normal.  Eyes: Conjunctivae and EOM are normal. Pupils are equal, round, and reactive to light. Right eye exhibits no discharge. Left eye exhibits no discharge. No scleral icterus.  Neck: Normal range of motion. Neck supple.  Cardiovascular: An irregularly irregular rhythm present. Tachycardia present.  Exam reveals no gallop and no friction rub.   No murmur heard. Pulmonary/Chest: Effort normal and breath sounds normal. No stridor. No respiratory distress. She has no rales.  Abdominal: Soft. She exhibits no distension. There is no tenderness.  Musculoskeletal: She exhibits no edema or tenderness.  Neurological: She is alert and oriented to person, place, and time.  Skin: Skin is warm and dry. No rash noted. She is not diaphoretic. No erythema.  Psychiatric: She has a normal mood and affect.  Vitals reviewed.    ED Treatments / Results  Labs (all labs ordered are listed, but only abnormal results are displayed) Labs Reviewed  BASIC METABOLIC PANEL  - Abnormal; Notable for the following:       Result Value   Glucose, Bld 151 (*)    All other components within normal limits  CBC WITH DIFFERENTIAL/PLATELET  MAGNESIUM    EKG  EKG Interpretation None       Radiology No results found.  Procedures Procedures (including critical care time)  Medications Ordered in ED Medications  amiodarone (PACERONE) tablet 200 mg (200 mg Oral Given 05/04/16 1509)  sodium chloride 0.9 % bolus 1,000 mL (1,000 mLs Intravenous New Bag/Given 05/04/16 1400)  apixaban (ELIQUIS) tablet 5 mg (5 mg Oral Given 05/04/16 1510)     Initial Impression / Assessment and Plan / ED Course  I have reviewed the triage vital signs and the nursing notes.  Pertinent labs & imaging results that were available during my care of the patient were reviewed by me and considered in my medical decision making (see chart for details).  Clinical Course as of May 05 1527  Sat May 04, 2016  1521 A. fib RVR on EKG. No evidence of acute ischemia. Given IV fluids and patient rates improved without other intervention. She was given her morning dose of amiodarone and Eliquis.   Patient monitored for several hours without recurrence. Per patient there is complete resolution of her symptomatology.  Discharge with strict return precautions.  [PC]    Clinical Course User Index [PC] Fatima Blank, MD      Final Clinical Impressions(s) / ED Diagnoses   Final diagnoses:  Atrial fibrillation with RVR ([place])   Disposition: Discharge  Condition: Good  I have discussed the results, Dx and Tx plan with the patient who expressed understanding and agree(s) with the plan. Discharge instructions discussed at great length. The patient was given strict return precautions who verbalized understanding of the instructions. No further questions at time of discharge.    Current Discharge Medication List      Follow Up: Gildardo Cranker, DO [address] [place]  [id number] [phone]  Schedule an appointment as soon as possible for a visit  As needed      Fatima Blank, MD 05/04/16 1530

## 2016-05-10 ENCOUNTER — Ambulatory Visit (INDEPENDENT_AMBULATORY_CARE_PROVIDER_SITE_OTHER): Payer: Medicare Other | Admitting: Internal Medicine

## 2016-05-10 ENCOUNTER — Encounter: Payer: Self-pay | Admitting: Internal Medicine

## 2016-05-10 ENCOUNTER — Telehealth: Payer: Self-pay | Admitting: Cardiology

## 2016-05-10 VITALS — BP 120/70 | HR 79 | Temp 98.0°F | Wt 151.0 lb

## 2016-05-10 DIAGNOSIS — F4321 Adjustment disorder with depressed mood: Secondary | ICD-10-CM

## 2016-05-10 DIAGNOSIS — F4320 Adjustment disorder, unspecified: Secondary | ICD-10-CM | POA: Diagnosis not present

## 2016-05-10 DIAGNOSIS — M159 Polyosteoarthritis, unspecified: Secondary | ICD-10-CM

## 2016-05-10 DIAGNOSIS — G8929 Other chronic pain: Secondary | ICD-10-CM | POA: Diagnosis not present

## 2016-05-10 DIAGNOSIS — I10 Essential (primary) hypertension: Secondary | ICD-10-CM | POA: Diagnosis not present

## 2016-05-10 DIAGNOSIS — Z79899 Other long term (current) drug therapy: Secondary | ICD-10-CM | POA: Diagnosis not present

## 2016-05-10 DIAGNOSIS — M25561 Pain in right knee: Secondary | ICD-10-CM | POA: Diagnosis not present

## 2016-05-10 DIAGNOSIS — M150 Primary generalized (osteo)arthritis: Secondary | ICD-10-CM

## 2016-05-10 DIAGNOSIS — I480 Paroxysmal atrial fibrillation: Secondary | ICD-10-CM | POA: Diagnosis not present

## 2016-05-10 DIAGNOSIS — F418 Other specified anxiety disorders: Secondary | ICD-10-CM | POA: Diagnosis not present

## 2016-05-10 LAB — TSH: TSH: 1.87 mIU/L

## 2016-05-10 NOTE — Progress Notes (Signed)
Patient ID: LUCERO AUZENNE, female   DOB: Feb 10, 1927, 83 y.o.   MRN: [id number]    Location:  [facility] Place of Service: OFFICE  Chief Complaint  Patient presents with  . Medical Management of Chronic Issues    60[facility] follow-up, ER follow-up 05/04/2016 (3 hours)    HPI:  83yo female seen today for f/u. She was seen in the ED 05/04/16 for afib with RVR that resolved after taking amiodarone and also her eliquis. ECG showed no acute ischemia. She has not seen cardio since Apr 2017. She was due to f/u in Oct 2017 but states she did not receive an appt. She reports she has a "heavy" sensation in her chest occasionally when she lies down at night. No SOB, CP, palpitations. Her youngest sister expired last week after MI and cardiac arrest  She had her AWV on 05/01/16  She saw her eye specialist Dr SGershon Craneand was dx with macular degeneration L>R. She was Rx preservision gtts. She is a poor historian due to memory loss. Hx obtained from chart  Depression/grief reaction - improving. has tried 4 meds (lexapro, zoloft, remeron and trazodone) in the past and unable to tolerate ADRs. No anaphylaxis to meds tried. She misses her deceased spouse but overall mood is improved. Some short term memory issues. Takes prn lorazepam and prn ambien. She has difficulty staying asleep.   HTN/CAD/hyperlipidemia/PAF - rate controlled on metoprolol and amiodarone. She takes losartan for BP. Taking eliquis for anticoagulation. On niacin for cholesterol. Followed by cardiology Dr MAundra Dubin GERD - stable on protonix. Occasional diarrhea. Takes pepto bismul prn  Migraine - stable. No exacerbations since last OV  Hx CVA - no new sx's. Takes eliquis and niacin  She also takes HRT - estrace cream. Followed by GYN  Prolapsed bladder - followed by GYN and has a pessary. She c/o urinary leakage that requires her to wear panty liners   Past Medical History:  Diagnosis Date  . Anemia   . Anxiety   . BACK PAIN, LUMBAR 09/27/2009  . CAD  (coronary artery disease)    a. s/p remote CABG 1997, 3V.;  b. LexiScan Myoview (8/15): No ischemia, EF 72%, normal study;  c. Echo 12/13 - EF 60-65%, no significant valvular abnormalities, normal RV size and systolic function.   . CVA (cerebral infarction)    a. Thalamic infarct 09/2009. //  b. hx of TIA in 2008  . Gastric ulcer   . GERD 11/25/2006  . GI bleed    a. 09/2009: secondary to antral ulcer.  . Hiatal hernia   . History of diverticulitis Dx 02/2012  . History of MI (myocardial infarction)   . HYPERLIPIDEMIA   . HYPERTENSION   . IBS 12/07/2008  . MIGRAINE WITH AURA 08/13/2007  . Mild carotid artery disease ([facility]    a. 0-39% bilat 05/2011 (f/u recommended 05/2013). //  b. Carotid [place] - Bilat ICA 1-39% >> FU prn  . Muscle weakness (generalized) 09/27/2009  . OSTEOARTHRITIS 08/13/2007  . [place] 11/25[place]   a. Multaq >> changes to Amiodarone 2/2 $$  //  Eliquis (CHADS2-VASc = 6)  . Urinary, incontinence, stress female    wears Pessary    Past Surgical History:  Procedure Laterality Date  . ABDOMINAL HYSTERECTOMY  1973  . CATARACT EXTRACTION W/ INTRAOCULAR LENS  IMPLANT, BILATERAL Bilateral 1999  . CHOLECYSTECTOMY N/A 01/03/2015   Procedure: LAPAROSCOPIC CHOLECYSTECTOMY WITH INTRAOPERATIVE CHOLANGIOGRAM;  Surgeon: MDonnie Mesa MD;  Location:  [facility];  Service: General;  Laterality: N/A;  . CORONARY ANGIOPLASTY  1997   Dr Lia Foyer  . CORONARY ARTERY BYPASS GRAFT  1997   Dr Melvenia Needles  . LAPAROSCOPIC CHOLECYSTECTOMY  01/03/2015  . TONSILLECTOMY      Patient Care Team: Gildardo Cranker, DO as PCP - General (Internal Medicine) Hillary Bow, MD (Cardiology) Melissa Montane, MD as Consulting Physician (Otolaryngology) Arvella Nigh, MD as Consulting Physician (Obstetrics and Gynecology) Rutherford Guys, MD as Consulting Physician (Ophthalmology)  Social History   Social History  . Marital status: Widowed    Spouse name: N/A  . Number of children: N/A    . Years of education: N/A   Occupational History  . Not on file.   Social History Main Topics  . Smoking status: Never Smoker  . Smokeless tobacco: Never Used  . Alcohol use No  . Drug use: No  . Sexual activity: No   Other Topics Concern  . Not on file   Social History Narrative  . No narrative on file     reports that she has never smoked. She has never used smokeless tobacco. She reports that she does not drink alcohol or use drugs.  Family History  Problem Relation Age of Onset  . Heart attack Mother   . Heart failure Father   . Throat cancer Sister   . Heart disease Sister   . Coronary artery disease Sister   . Diabetes Sister   . Heart attack Sister   . Colon cancer Neg Hx    Family Status  Relation Status  . Mother Deceased  . Father Deceased  . Sister Deceased  . Sister Deceased  . Sister Deceased  . Brother Deceased  . Daughter Alive  . Son Alive  . Sister Alive  . Daughter Alive  . Maternal Grandmother Deceased  . Maternal Grandfather Deceased  . Paternal Grandmother Deceased  . Paternal Grandfather Deceased  . Neg Hx      Allergies  Allergen Reactions  . Asa [Aspirin] Other (See Comments)    Bleeding ulcer  . Crestor [Rosuvastatin Calcium] Other (See Comments)    myalgia  . Hydrocodone Other (See Comments)    REACTION: migraines  . Ibuprofen Other (See Comments)    ulcers  . Oxycodone Hcl Other (See Comments)    REACTION: migraines  . Penicillins Swelling and Rash    Has patient had a PCN reaction causing immediate rash, facial/tongue/throat swelling, SOB or lightheadedness with hypotension: Yes Has patient had a PCN reaction causing severe rash involving mucus membranes or skin necrosis: No Has patient had a PCN reaction that required hospitalization No Has patient had a PCN reaction occurring within the last 10 years: No If all of the above answers are "NO", then may proceed with Cephalosporin use.   . Prednisone Other (See  Comments)    ulcers  . Statins Other (See Comments)    Aches and pains  . Pristiq [Desvenlafaxine] Other (See Comments)    Drowsiness and hangover sensation    Medications: Patient's Medications  New Prescriptions   No medications on file  Previous Medications   ACETAMINOPHEN (TYLENOL) 500 MG TABLET    Take 500-1,000 mg by mouth every 6 (six) hours as needed for moderate pain.    AMIODARONE (PACERONE) 200 MG TABLET    TAKE ONE-HALF TABLET DAILY   ELIQUIS 5 MG TABS TABLET    TAKE ONE TABLET TWICE DAILY   LORAZEPAM (ATIVAN) 1 MG TABLET  TAKE ONE TABLET EVERY EIGHT HOURS AS NEEDED FOR ANXIETY   LOSARTAN (COZAAR) 50 MG TABLET    Take 1 tablet (50 mg total) by mouth 2 (two) times daily.   MECLIZINE (ANTIVERT) 12.5 MG TABLET    TAKE ONE OR TWO TABLETS EVERY EIGHT HOURS AS NEEDED FOR DIZZINESS   METOPROLOL SUCCINATE (TOPROL-XL) 25 MG 24 HR TABLET    TAKE ONE-HALF TABLET DAILY   MULTIPLE VITAMIN (MULTIVITAMIN) TABLET    Take 1 tablet by mouth daily.     MULTIPLE VITAMINS-MINERALS (PRESERVISION AREDS) TABS    Take 1 tablet by mouth 2 (two) times daily.    NIACIN 500 MG CR CAPSULE    Take 500 mg by mouth 2 (two) times daily.    ONDANSETRON (ZOFRAN) 4 MG TABLET    Take 1 tablet (4 mg total) by mouth every 8 (eight) hours as needed for nausea or vomiting.   PANTOPRAZOLE (PROTONIX) 40 MG TABLET    TAKE ONE TABLET TWICE DAILY   SODIUM CHLORIDE (OCEAN) 0.65 % SOLN NASAL SPRAY    Place 1 spray into both nostrils as needed for congestion.   TETRAHYDROZOLINE HCL (VISINE OP)    Place 2 drops into both eyes as needed (for dry eyes).   ZOLPIDEM (AMBIEN) 10 MG TABLET    Take 1 tablet (10 mg total) by mouth at bedtime as needed for sleep.  Modified Medications   No medications on file  Discontinued Medications   No medications on file    Review of Systems  Unable to perform ROS: Psychiatric disorder    Vitals:   05/10/16 1339  BP: 120/70  Pulse: 79  Temp: 98 F (36.7 C)  TempSrc: Oral  SpO2:  98%  Weight: 151 lb (68.5 kg)   Body mass index is 22.96 kg/m.  Physical Exam  Constitutional: She appears well-developed and well-nourished.  Tearful   HENT:  Mouth/Throat: Oropharynx is clear and moist. No oropharyngeal exudate.  Eyes: Pupils are equal, round, and reactive to light. No scleral icterus.  Neck: Neck supple. Carotid bruit is not present. No tracheal deviation present. No thyromegaly present.  Cardiovascular: Normal rate and intact distal pulses.  An irregularly irregular rhythm present. Exam reveals no gallop and no friction rub.   Murmur (1/6 SEM) heard. No LE edema b/l. no calf TTP. Spider veins present b/l. No palpable cords  Pulmonary/Chest: Effort normal and breath sounds normal. No stridor. No respiratory distress. She has no wheezes. She has no rales. She exhibits no tenderness.  Abdominal: Soft. Bowel sounds are normal. She exhibits no distension and no mass. There is no hepatomegaly. There is tenderness. There is no rebound, no guarding and negative Murphy's sign.  Musculoskeletal: She exhibits edema (right knee swelling anteriorly with reduced ROM and cannot  fully extend or flex) and tenderness.  Lymphadenopathy:    She has no cervical adenopathy.  Neurological: She is alert.  Skin: Skin is warm and dry. No rash noted.  Psychiatric: Her behavior is normal. Her mood appears anxious. She exhibits a depressed mood.     Labs reviewed: Admission on 05/04/2016, Discharged on 05/04/2016  Component Date Value Ref Range Status  . WBC 05/04/2016 6.0  4.0 - 10.5 K/uL Final  . RBC 05/04/2016 4.66  3.87 - 5.11 MIL/uL Final  . Hemoglobin 05/04/2016 13.8  12.0 - 15.0 g/dL Final  . HCT 05/04/2016 42.5  36.0 - 46.0 % Final  . MCV 05/04/2016 91.2  78.0 - 100.0 fL Final  . MCH  05/04/2016 29.6  26.0 - 34.0 pg Final  . MCHC 05/04/2016 32.5  30.0 - 36.0 g/dL Final  . RDW 05/04/2016 13.1  11.5 - 15.5 % Final  . Platelets 05/04/2016 215  150 - 400 K/uL Final  . Neutrophils  Relative % 05/04/2016 68  % Final  . Neutro Abs 05/04/2016 4.1  1.7 - 7.7 K/uL Final  . Lymphocytes Relative 05/04/2016 19  % Final  . Lymphs Abs 05/04/2016 1.1  0.7 - 4.0 K/uL Final  . Monocytes Relative 05/04/2016 10  % Final  . Monocytes Absolute 05/04/2016 0.6  0.1 - 1.0 K/uL Final  . Eosinophils Relative 05/04/2016 2  % Final  . Eosinophils Absolute 05/04/2016 0.1  0.0 - 0.7 K/uL Final  . Basophils Relative 05/04/2016 1  % Final  . Basophils Absolute 05/04/2016 0.0  0.0 - 0.1 K/uL Final  . Sodium 05/04/2016 138  135 - 145 mmol/L Final  . Potassium 05/04/2016 3.9  3.5 - 5.1 mmol/L Final  . Chloride 05/04/2016 101  101 - 111 mmol/L Final  . CO2 05/04/2016 28  22 - 32 mmol/L Final  . Glucose, Bld 05/04/2016 151* 65 - 99 mg/dL Final  . BUN 05/04/2016 11  6 - 20 mg/dL Final  . Creatinine, Ser 05/04/2016 0.69  0.44 - 1.00 mg/dL Final  . Calcium 05/04/2016 9.4  8.9 - 10.3 mg/dL Final  . GFR calc non Af Amer 05/04/2016 >60  >60 mL/min Final  . GFR calc Af Amer 05/04/2016 >60  >60 mL/min Final   Comment: (NOTE) The eGFR has been calculated using the CKD EPI equation. This calculation has not been validated in all clinical situations. eGFR's persistently <60 mL/min signify possible Chronic Kidney Disease.   . Anion gap 05/04/2016 9  5 - 15 Final  . Magnesium 05/04/2016 1.9  1.7 - 2.4 mg/dL Final  Office Visit on 03/15/2016  Component Date Value Ref Range Status  . Total Bilirubin 03/15/2016 0.5  0.2 - 1.2 mg/dL Final  . Bilirubin, Direct 03/15/2016 0.2  <=0.2 mg/dL Final  . Indirect Bilirubin 03/15/2016 0.3  0.2 - 1.2 mg/dL Final  . Alkaline Phosphatase 03/15/2016 115  33 - 130 U/L Final  . AST 03/15/2016 20  10 - 35 U/L Final  . ALT 03/15/2016 34* 6 - 29 U/L Final  . Total Protein 03/15/2016 6.4  6.1 - 8.1 g/dL Final  . Albumin 03/15/2016 3.7  3.6 - 5.1 g/dL Final  Office Visit on 03/07/2016  Component Date Value Ref Range Status  . WBC 03/07/2016 6.6  3.8 - 10.8 K/uL Final  .  RBC 03/07/2016 3.97  3.80 - 5.10 MIL/uL Final  . Hemoglobin 03/07/2016 11.8  11.7 - 15.5 g/dL Final  . HCT 03/07/2016 36.4  35.0 - 45.0 % Final  . MCV 03/07/2016 91.7  80.0 - 100.0 fL Final  . MCH 03/07/2016 29.7  27.0 - 33.0 pg Final  . MCHC 03/07/2016 32.4  32.0 - 36.0 g/dL Final  . RDW 03/07/2016 13.6  11.0 - 15.0 % Final  . Platelets 03/07/2016 234  140 - 400 K/uL Final  . MPV 03/07/2016 9.9  7.5 - 12.5 fL Final  . Neutro Abs 03/07/2016 4092  1,500 - 7,800 cells/uL Final  . Lymphs Abs 03/07/2016 1518  850 - 3,900 cells/uL Final  . Monocytes Absolute 03/07/2016 594  200 - 950 cells/uL Final  . Eosinophils Absolute 03/07/2016 330  15 - 500 cells/uL Final  . Basophils Absolute 03/07/2016 66  0 -  200 cells/uL Final  . Neutrophils Relative % 03/07/2016 62  % Final  . Lymphocytes Relative 03/07/2016 23  % Final  . Monocytes Relative 03/07/2016 9  % Final  . Eosinophils Relative 03/07/2016 5  % Final  . Basophils Relative 03/07/2016 1  % Final  . Smear Review 03/07/2016 Criteria for review not met   Final  . Sodium 03/08/2016 140  135 - 146 mmol/L Final  . Potassium 03/08/2016 3.8  3.5 - 5.3 mmol/L Final  . Chloride 03/08/2016 102  98 - 110 mmol/L Final  . CO2 03/08/2016 25  20 - 31 mmol/L Final  . Glucose, Bld 03/08/2016 181* 65 - 99 mg/dL Final  . BUN 03/08/2016 14  7 - 25 mg/dL Final  . Creat 03/08/2016 0.54* 0.60 - 0.88 mg/dL Final   Comment:   For patients > or = 81 years of age: The upper reference limit for Creatinine is approximately 13% higher for people identified as African-American.     . Total Bilirubin 03/08/2016 0.7  0.2 - 1.2 mg/dL Final  . Alkaline Phosphatase 03/08/2016 155* 33 - 130 U/L Final  . AST 03/08/2016 34  10 - 35 U/L Final  . ALT 03/08/2016 96* 6 - 29 U/L Final  . Total Protein 03/08/2016 6.3  6.1 - 8.1 g/dL Final  . Albumin 03/08/2016 3.5* 3.6 - 5.1 g/dL Final  . Calcium 03/08/2016 9.1  8.6 - 10.4 mg/dL Final  . GFR, Est African American 03/08/2016  >89  >=60 mL/min Final  . GFR, Est Non African American 03/08/2016 85  >=60 mL/min Final  . Lipase 03/08/2016 34  7 - 60 U/L Final  . Amylase 03/08/2016 57  0 - 105 U/L Final    No results found.   Assessment/Plan   ICD-9-CM ICD-10-CM   1. Paroxysmal atrial fibrillation (HCC) 427.31 I48.0 Ambulatory referral to Cardiology     TSH  2. Grief reaction 309.0 F43.20   3. Depression with anxiety 300.4 F41.8   4. Primary osteoarthritis involving multiple joints 715.09 M15.0   5. Essential hypertension 401.9 I10   6. Chronic pain of right knee 719.46 M25.561 DG Knee Complete 4 Views Right   338.29 G89.29   7. High risk medication use V58.69 Z79.899 TSH   Continue current medication as ordered  Will call with lab results  Will call with cardiology referral. She sees Dr Aundra Dubin  Follow up in 3 mos for routine visit.    Fredrico Beedle S. Perlie Gold  [facility] [address] [place], [place] [address] [phone] Cell (Monday-Friday 8 AM - 5 PM) [phone] After 5 PM and follow prompts

## 2016-05-10 NOTE — Telephone Encounter (Signed)
New message      Pt of Dr Aundra Dubin.  Pt was seen in the hosp last sat and was told to see her cardiologist.  Calling to see if she is still a Dr Aundra Dubin pt and needs an appt.  Please call

## 2016-05-10 NOTE — Telephone Encounter (Signed)
Pt advised she does need follow up appt. I offered pt appt 05/14/16 or 05/15/16 with Nicki Reaper but she declined, pt has been scheduled to see [place] 05/20/16 at 1:45PM.

## 2016-05-10 NOTE — Patient Instructions (Addendum)
Continue current medication as ordered  Will call with lab results  Will call with cardiology referral. She sees Dr Aundra Dubin  Follow up in 3 mos for routine visit.

## 2016-05-17 ENCOUNTER — Other Ambulatory Visit: Payer: Self-pay | Admitting: Internal Medicine

## 2016-05-20 ENCOUNTER — Ambulatory Visit
Admission: RE | Admit: 2016-05-20 | Discharge: 2016-05-20 | Disposition: A | Discharge status: Home / Self Care | Payer: Medicare Other | Source: Ambulatory Visit | Attending: Internal Medicine | Admitting: Internal Medicine

## 2016-05-20 ENCOUNTER — Ambulatory Visit (INDEPENDENT_AMBULATORY_CARE_PROVIDER_SITE_OTHER): Payer: Medicare Other | Admitting: Physician Assistant

## 2016-05-20 ENCOUNTER — Encounter: Payer: Self-pay | Admitting: Physician Assistant

## 2016-05-20 VITALS — BP 132/52 | HR 55 | Ht 68.0 in | Wt 152.0 lb

## 2016-05-20 DIAGNOSIS — R2689 Other abnormalities of gait and mobility: Secondary | ICD-10-CM

## 2016-05-20 DIAGNOSIS — I480 Paroxysmal atrial fibrillation: Secondary | ICD-10-CM

## 2016-05-20 DIAGNOSIS — I10 Essential (primary) hypertension: Secondary | ICD-10-CM

## 2016-05-20 DIAGNOSIS — I2510 Atherosclerotic heart disease of native coronary artery without angina pectoris: Secondary | ICD-10-CM | POA: Diagnosis not present

## 2016-05-20 DIAGNOSIS — Z79899 Other long term (current) drug therapy: Secondary | ICD-10-CM | POA: Diagnosis not present

## 2016-05-20 DIAGNOSIS — M25561 Pain in right knee: Secondary | ICD-10-CM | POA: Diagnosis not present

## 2016-05-20 DIAGNOSIS — G8929 Other chronic pain: Secondary | ICD-10-CM

## 2016-05-20 NOTE — Progress Notes (Signed)
Cardiology Office Note:    Date:  05/20/2016   ID:  Marissa Bowen, DOB 10-15-1926, MRN [id number]  PCP:  Gildardo Cranker, DO  Cardiologist:  Dr. Loralie Champagne   Electrophysiologist:  n/a  Referring MD: Gildardo Cranker, DO   Chief Complaint  Patient presents with  . Hospitalization Follow-up    ED visit for AFib    History of Present Illness:    Marissa Bowen is a 83 y.o. female with a hx of CAD s/p CABG in 1997, PAF, HL, HTN.  She had recurrent AF in 2015. She has been managed with Apixaban for anticoagulation and Dronedarone for rhythm control.  She had to change Dronedarone to Amiodarone due to cost.  She had to change ACE inhibitor to angiotensin receptor blocker due to cough.  Last seen 4/17.     CHADS2-VASc=6 (age, female, CVA, vascular dz). Serum creatinine: 0.69 mg/dL 05/04/16 1258 Estimated creatinine clearance: 50 mL/min  Anticoagulation:  Apixaban (05/04/2016: Creatinine, Ser 0.69; Weight: 152 lb (68.9 kg)) >> 5 mg bid  Rhythm agent - Amiodarone  She was seen in the ED 05/04/16 with symptomatic AF with RVR.  No intervention was undertaken in the ED. She returns for Cardiology follow up.  She denies further rapid palpitations. She denies chest discomfort or shortness of breath. She denies syncope or near syncope. She denies orthopnea, PND or edema. She denies bleeding issues.  She has a lot of problems with balance. She also struggles with vertigo. She also has a lot of anxiety. She does not like to fly and could not go visit her sister in [place] who was critically ill. Her sister has since passed away.   Prior CV studies that were reviewed today include:    Carotid US 3/17 Bilat ICA 1-39% >> FU prn  Myoview 11/30/13 Normal stress nuclear study LV Ejection Fraction: 72%. LV Wall Motion: NL LV Function; NL Wall Motion  Carotid US 06/30/13 bilat 1-39% ICA FU 2 years  Echo 04/03/12 Mild LVH, EF 60-65%, normal wall motion, grade 2 diastolic dysfunction, trivial AI,  trivial MR, mild LAE, normal RV function, mild to moderate TR, PASP 37 mmHg (borderline pulmonary hypertension)  Event Monitor 11/2008 Normal   Past Medical History:  Diagnosis Date  . Anemia   . Anxiety   . BACK PAIN, LUMBAR 09/27/2009  . CAD (coronary artery disease)    a. s/p remote CABG 1997, 3V.;  b. LexiScan Myoview (8/15): No ischemia, EF 72%, normal study;  c. Echo 12/13 - EF 60-65%, no significant valvular abnormalities, normal RV size and systolic function.   . CVA (cerebral infarction)    a. Thalamic infarct 09/2009. //  b. hx of TIA in 2008  . Gastric ulcer   . GERD 11/25/2006  . GI bleed    a. 09/2009: secondary to antral ulcer.  . Hiatal hernia   . History of diverticulitis Dx 02/2012  . History of MI (myocardial infarction)   . HYPERLIPIDEMIA   . HYPERTENSION   . IBS 12/07/2008  . MIGRAINE WITH AURA 08/13/2007  . Mild carotid artery disease ([place])    a. 0-39% bilat 05/2011 (f/u recommended 05/2013). //  b. Carotid US 3/17 - Bilat ICA 1-39% >> FU prn  . Muscle weakness (generalized) 09/27/2009  . OSTEOARTHRITIS 08/13/2007  . PAF (paroxysmal atrial fibrillation) ([place]) 11/25/2006   a. Multaq >> changes to Amiodarone 2/2 $$  //  Eliquis (CHADS2-VASc = 6)  . Urinary, incontinence, stress female    wears Pessary  1. CAD: s/p CABG 1997. Echo (12/13) with EF 60-65%, no significant valvular abnormalities, normal RV size and systolic function. Lexiscan Cardiolite (8/15) with EF 72%, no ischemia or infarction.  2. Paroxysmal atrial fibrillation noted since 11/13. Very symptomatic. She, of note, has a history of TIA in 2008. Was on dronedarone but stopped due to doughnut hole and now on amiodarone.  3. HTN: edema with amlodipine.  4. Hyperlipidemia: Unable to tolerate statins due to myalgias. 5. Migraines 6. GERD 7. IBS 8. Low back pain  Past Surgical History:  Procedure Laterality Date  . ABDOMINAL HYSTERECTOMY  1973  . CATARACT EXTRACTION W/ INTRAOCULAR LENS  IMPLANT,  BILATERAL Bilateral 1999  . CHOLECYSTECTOMY N/A 01/03/2015   Procedure: LAPAROSCOPIC CHOLECYSTECTOMY WITH INTRAOPERATIVE CHOLANGIOGRAM;  Surgeon: Donnie Mesa, MD;  Location: [facility];  Service: General;  Laterality: N/A;  . CORONARY ANGIOPLASTY  1997   Dr Lia Foyer  . CORONARY ARTERY BYPASS GRAFT  1997   Dr Melvenia Needles  . LAPAROSCOPIC CHOLECYSTECTOMY  01/03/2015  . TONSILLECTOMY      Current Medications: Current Meds  Medication Sig  . acetaminophen (TYLENOL) 500 MG tablet Take 500-1,000 mg by mouth every 6 (six) hours as needed for moderate pain.   Marland Kitchen amiodarone (PACERONE) 200 MG tablet TAKE ONE-HALF TABLET DAILY  . ELIQUIS 5 MG TABS tablet TAKE ONE TABLET TWICE DAILY  . LORazepam (ATIVAN) 1 MG tablet TAKE ONE TABLET EVERY EIGHT HOURS AS NEEDED FOR ANXIETY  . losartan (COZAAR) 50 MG tablet Take 1 tablet (50 mg total) by mouth 2 (two) times daily.  . meclizine (ANTIVERT) 12.5 MG tablet Take 12.5 mg by mouth 2 (two) times daily as needed for dizziness or nausea.  . Multiple Vitamin (MULTIVITAMIN) tablet Take 1 tablet by mouth daily.    . Multiple Vitamins-Minerals (PRESERVISION AREDS) TABS Take 1 tablet by mouth 2 (two) times daily.   . niacin 500 MG CR capsule Take 500 mg by mouth 2 (two) times daily.   . ondansetron (ZOFRAN) 4 MG tablet Take 1 tablet (4 mg total) by mouth every 8 (eight) hours as needed for nausea or vomiting.  . pantoprazole (PROTONIX) 40 MG tablet TAKE ONE TABLET TWICE DAILY  . sodium chloride (OCEAN) 0.65 % SOLN nasal spray Place 1 spray into both nostrils as needed for congestion.  . Tetrahydrozoline HCl (VISINE OP) Place 2 drops into both eyes as needed (for dry eyes).  Marland Kitchen zolpidem (AMBIEN) 10 MG tablet Take 1 tablet (10 mg total) by mouth at bedtime as needed for sleep.  . [DISCONTINUED] metoprolol succinate (TOPROL-XL) 25 MG 24 hr tablet TAKE ONE-HALF TABLET DAILY     Allergies:   Asa [aspirin]; Crestor [rosuvastatin calcium]; Hydrocodone; Ibuprofen; Oxycodone hcl;  Penicillins; Prednisone; Statins; and Pristiq [desvenlafaxine]   Social History   Social History  . Marital status: Widowed    Spouse name: N/A  . Number of children: N/A  . Years of education: N/A   Social History Main Topics  . Smoking status: Never Smoker  . Smokeless tobacco: Never Used  . Alcohol use No  . Drug use: No  . Sexual activity: No   Other Topics Concern  . None   Social History Narrative  . None     Family History:  The patient's family history includes Coronary artery disease in her sister; Diabetes in her sister; Heart attack in her mother and sister; Heart disease in her sister; Heart failure in her father; Throat cancer in her sister.   ROS:  Please see the history of present illness.    Review of Systems  Constitution: Positive for malaise/fatigue.  Eyes: Positive for visual disturbance.  Cardiovascular: Positive for irregular heartbeat.  Hematologic/Lymphatic: Bruises/bleeds easily.  Musculoskeletal: Positive for joint swelling.  Neurological: Positive for loss of balance.  Psychiatric/Behavioral: Positive for depression.   All other systems reviewed and are negative.   EKGs/Labs/Other Test Reviewed:    EKG:  EKG is  ordered today.  The ekg ordered today demonstrates sinus brady, HR 55, normal axis, QTc 470 ms, no sig changes  Recent Labs: 03/15/2016: ALT 34 05/04/2016: BUN 11; Creatinine, Ser 0.69; Hemoglobin 13.8; Magnesium 1.9; Platelets 215; Potassium 3.9; Sodium 138 05/10/2016: TSH 1.87   Recent Lipid Panel    Component Value Date/Time   CHOL 175 01/29/2016 1000   CHOL 209 (H) 07/05/2015 0918   TRIG 111 01/29/2016 1000   HDL 50 01/29/2016 1000   HDL 61 07/05/2015 0918   CHOLHDL 3.5 01/29/2016 1000   VLDL 22 01/29/2016 1000   LDLCALC 103 01/29/2016 1000   LDLCALC 124 (H) 07/05/2015 0918     Physical Exam:    VS:  BP (!) 132/52   Pulse (!) 55   Ht 5\' 8"  (1.727 m)   Wt 152 lb (68.9 kg)   BMI 23.11 kg/m     Wt Readings from  Last 3 Encounters:  05/20/16 152 lb (68.9 kg)  05/10/16 151 lb (68.5 kg)  05/01/16 152 lb 3.2 oz (69 kg)     Physical Exam  Constitutional: She is oriented to person, place, and time. She appears well-developed and well-nourished. No distress.  HENT:  Head: Normocephalic and atraumatic.  Eyes: No scleral icterus.  Neck: No JVD present.  Cardiovascular: Normal rate, regular rhythm and normal heart sounds.   No murmur heard. Pulmonary/Chest: Effort normal. She has no wheezes. She has no rales.  Abdominal: Soft. There is no tenderness.  Musculoskeletal: She exhibits no edema.  Neurological: She is alert and oriented to person, place, and time.  Skin: Skin is warm and dry.  Psychiatric: She has a normal mood and affect.    ASSESSMENT:    1. Paroxysmal atrial fibrillation (HCC)   2. Coronary artery disease involving native coronary artery of native heart without angina pectoris   3. Essential hypertension   4. On amiodarone therapy   5. Balance disorder    PLAN:    In order of problems listed above:  1. Parox AF -  She is back in NSR.  Her HR is in the 50s and she has a lot of problems with balance.  I think she can stop taking Toprol.  -  DC Metoprolol Succinate.  -  Continue Amiodarone, Apixaban.  -  Hgb 13.8 and SCr 0.69 in 1/18.    2. CAD - s/p CABG 1997.  No angina.  She is not on ASA as she is on Apixaban.    3. HTN - BP is controlled.   4. Amiodarone Rx - She remains on Amio 100 QD.  ALT in 11/17 was 34.  TSH in 1/18 was 1.87.  5. Balance disorder - She has fallen recently without significant injury.  She has a fear of falling.  She also has a hx of BPPV.  She takes prn Meclizine.  I have recommended referral to PT for balance evaluation.  She prefers to do this via home health.  She will discuss further with her PCP.     Medication Adjustments/Labs and Tests Ordered:  Current medicines are reviewed at length with the patient today.  Concerns regarding medicines are  outlined above.  Medication changes, Labs and Tests ordered today are outlined in the Patient Instructions noted below. Patient Instructions  Medication Instructions:  STOP taking Metoprolol Succinate.  Labwork: None   Testing/Procedures: None   Follow-Up: Richardson Dopp, PA-C in 6 months.   Any Other Special Instructions Will Be Listed Below (If Applicable). I think you would benefit from seeing physical therapy for your balance.   You can discuss this with your primary care provider further. Let me know if you need me to refer you.  If you need a refill on your cardiac medications before your next appointment, please call your pharmacy.   Signed, Richardson Dopp, PA-C  05/20/2016 3:10 PM    [facility] [address], [place], [place]  [address] Phone: [phone]; Fax: [phone]

## 2016-05-20 NOTE — Patient Instructions (Signed)
Medication Instructions:  STOP taking Metoprolol Succinate.  Labwork: None   Testing/Procedures: None   Follow-Up: Richardson Dopp, PA-C in 6 months.   Any Other Special Instructions Will Be Listed Below (If Applicable). I think you would benefit from seeing physical therapy for your balance.   You can discuss this with your primary care provider further. Let me know if you need me to refer you.  If you need a refill on your cardiac medications before your next appointment, please call your pharmacy.

## 2016-05-20 NOTE — Progress Notes (Signed)
Have her see you and Dr End.

## 2016-05-23 ENCOUNTER — Other Ambulatory Visit: Payer: Self-pay | Admitting: Internal Medicine

## 2016-06-05 ENCOUNTER — Encounter (HOSPITAL_COMMUNITY): Payer: Medicare Other

## 2016-06-05 ENCOUNTER — Ambulatory Visit (INDEPENDENT_AMBULATORY_CARE_PROVIDER_SITE_OTHER): Payer: Medicare Other | Admitting: Internal Medicine

## 2016-06-05 ENCOUNTER — Encounter: Payer: Self-pay | Admitting: Internal Medicine

## 2016-06-05 VITALS — BP 128/70 | HR 68 | Temp 97.7°F | Ht 68.0 in | Wt 151.4 lb

## 2016-06-05 DIAGNOSIS — I2510 Atherosclerotic heart disease of native coronary artery without angina pectoris: Secondary | ICD-10-CM

## 2016-06-05 DIAGNOSIS — M150 Primary generalized (osteo)arthritis: Secondary | ICD-10-CM | POA: Diagnosis not present

## 2016-06-05 DIAGNOSIS — M159 Polyosteoarthritis, unspecified: Secondary | ICD-10-CM

## 2016-06-05 DIAGNOSIS — I480 Paroxysmal atrial fibrillation: Secondary | ICD-10-CM | POA: Diagnosis not present

## 2016-06-05 DIAGNOSIS — G8929 Other chronic pain: Secondary | ICD-10-CM

## 2016-06-05 DIAGNOSIS — M25561 Pain in right knee: Secondary | ICD-10-CM | POA: Diagnosis not present

## 2016-06-05 DIAGNOSIS — Z7901 Long term (current) use of anticoagulants: Secondary | ICD-10-CM | POA: Diagnosis not present

## 2016-06-05 DIAGNOSIS — M7989 Other specified soft tissue disorders: Secondary | ICD-10-CM

## 2016-06-05 MED ORDER — TRAMADOL HCL 50 MG PO TABS: 50.0000 mg | ORAL_TABLET | Freq: Three times a day (TID) | ORAL | 0 refills | Status: DC | PRN

## 2016-06-05 NOTE — Patient Instructions (Signed)
Recommend you use compression stockings or diabetic socks to improve swelling and pain in right leg  Will call with [place] appt  Continue other medications as ordered   Follow up with specialists as scheduled  Follow up as scheduled

## 2016-06-05 NOTE — Progress Notes (Signed)
Patient ID: Marissa Bowen, female   DOB: 22-Dec-1926, 83 y.o.   MRN: [id number]    Location:  [facility] Place of Service: OFFICE  Chief Complaint  Patient presents with  . Acute Visit    right leg pain,    HPI:  83 yo female seen today for right knee/leg pain. She takes Tylenol for pain which does not help. She had an xray of right knee 2 weeks ago that showed no acute process. No falls. She has difficulty weightbearing due to to pain.   Depression/grief reaction - improving. has tried 4 meds (lexapro, zoloft, remeron and trazodone) in the past and unable to tolerate ADRs. No anaphylaxis to meds tried. She misses her deceased spouse but overall mood is improved. Some short term memory issues. Takes prn lorazepam and prn ambien. She has difficulty staying asleep.   HTN/CAD/hyperlipidemia/PAF - rate controlled on metoprolol and amiodarone. She takes losartan for BP. Taking eliquis for anticoagulation. On niacin for cholesterol. Followed by cardiology Dr Aundra Dubin  GERD - stable on protonix. Occasional diarrhea. Takes pepto bismul prn  Migraine - stable. No exacerbations since last OV  Hx CVA - no new sx's. Takes eliquis and niacin  She also takes HRT - estrace cream. Followed by GYN  Prolapsed bladder - followed by GYN and has a pessary. She c/o urinary leakage that requires her to wear panty liners   Past Medical History:  Diagnosis Date  . Anemia   . Anxiety   . BACK PAIN, LUMBAR 09/27/2009  . CAD (coronary artery disease)    a. s/p remote CABG 1997, 3V.;  b. LexiScan Myoview (8/15): No ischemia, EF 72%, normal study;  c. Echo 12/13 - EF 60-65%, no significant valvular abnormalities, normal RV size and systolic function.   . CVA (cerebral infarction)    a. Thalamic infarct 09/2009. //  b. hx of TIA in 2008  . Gastric ulcer   . GERD 11/25/2006  . GI bleed    a. 09/2009: secondary to antral ulcer.  . Hiatal hernia   . History of diverticulitis Dx 02/2012  . History of MI (myocardial  infarction)   . HYPERLIPIDEMIA   . HYPERTENSION   . IBS 12/07/2008  . MIGRAINE WITH AURA 08/13/2007  . Mild carotid artery disease ([place])    a. 0-39% bilat 05/2011 (f/u recommended 05/2013). //  b. Carotid US 3/17 - Bilat ICA 1-39% >> FU prn  . Muscle weakness (generalized) 09/27/2009  . OSTEOARTHRITIS 08/13/2007  . PAF (paroxysmal atrial fibrillation) ([place]) 11/25/2006   a. Multaq >> changes to Amiodarone 2/2 $$  //  Eliquis (CHADS2-VASc = 6)  . Urinary, incontinence, stress female    wears Pessary    Past Surgical History:  Procedure Laterality Date  . ABDOMINAL HYSTERECTOMY  1973  . CATARACT EXTRACTION W/ INTRAOCULAR LENS  IMPLANT, BILATERAL Bilateral 1999  . CHOLECYSTECTOMY N/A 01/03/2015   Procedure: LAPAROSCOPIC CHOLECYSTECTOMY WITH INTRAOPERATIVE CHOLANGIOGRAM;  Surgeon: Donnie Mesa, MD;  Location: [facility];  Service: General;  Laterality: N/A;  . CORONARY ANGIOPLASTY  1997   Dr Lia Foyer  . CORONARY ARTERY BYPASS GRAFT  1997   Dr Melvenia Needles  . LAPAROSCOPIC CHOLECYSTECTOMY  01/03/2015  . TONSILLECTOMY      Patient Care Team: Gildardo Cranker, DO as PCP - General (Internal Medicine) Hillary Bow, MD (Cardiology) Melissa Montane, MD as Consulting Physician (Otolaryngology) Arvella Nigh, MD as Consulting Physician (Obstetrics and Gynecology) Rutherford Guys, MD as Consulting Physician (Ophthalmology)  Social History   Social History  .  Marital status: Widowed    Spouse name: N/A  . Number of children: N/A  . Years of education: N/A   Occupational History  . Not on file.   Social History Main Topics  . Smoking status: Never Smoker  . Smokeless tobacco: Never Used  . Alcohol use No  . Drug use: No  . Sexual activity: No   Other Topics Concern  . Not on file   Social History Narrative  . No narrative on file     reports that she has never smoked. She has never used smokeless tobacco. She reports that she does not drink alcohol or use drugs.  Family History  Problem Relation Age  of Onset  . Heart attack Mother   . Heart failure Father   . Throat cancer Sister   . Heart disease Sister   . Coronary artery disease Sister   . Diabetes Sister   . Heart attack Sister   . Colon cancer Neg Hx    Family Status  Relation Status  . Mother Deceased  . Father Deceased  . Sister Deceased  . Sister Deceased  . Sister Deceased  . Brother Deceased  . Daughter Alive  . Son Alive  . Sister Alive  . Daughter Alive  . Maternal Grandmother Deceased  . Maternal Grandfather Deceased  . Paternal Grandmother Deceased  . Paternal Grandfather Deceased  . Neg Hx      Allergies  Allergen Reactions  . Asa [Aspirin] Other (See Comments)    Bleeding ulcer  . Crestor [Rosuvastatin Calcium] Other (See Comments)    myalgia  . Hydrocodone Other (See Comments)    REACTION: migraines  . Ibuprofen Other (See Comments)    ulcers  . Oxycodone Hcl Other (See Comments)    REACTION: migraines  . Penicillins Swelling and Rash    Has patient had a PCN reaction causing immediate rash, facial/tongue/throat swelling, SOB or lightheadedness with hypotension: Yes Has patient had a PCN reaction causing severe rash involving mucus membranes or skin necrosis: No Has patient had a PCN reaction that required hospitalization No Has patient had a PCN reaction occurring within the last 10 years: No If all of the above answers are "NO", then may proceed with Cephalosporin use.   . Prednisone Other (See Comments)    ulcers  . Statins Other (See Comments)    Aches and pains  . Pristiq [Desvenlafaxine] Other (See Comments)    Drowsiness and hangover sensation    Medications: Patient's Medications  New Prescriptions   No medications on file  Previous Medications   ACETAMINOPHEN (TYLENOL) 500 MG TABLET    Take 500-1,000 mg by mouth every 6 (six) hours as needed for moderate pain.    AMIODARONE (PACERONE) 200 MG TABLET    TAKE ONE-HALF TABLET DAILY   ELIQUIS 5 MG TABS TABLET    TAKE ONE TABLET  TWICE DAILY   LORAZEPAM (ATIVAN) 1 MG TABLET    TAKE ONE TABLET EVERY EIGHT HOURS AS NEEDED FOR ANXIETY   LOSARTAN (COZAAR) 50 MG TABLET    Take 1 tablet (50 mg total) by mouth 2 (two) times daily.   MECLIZINE (ANTIVERT) 12.5 MG TABLET    Take 12.5 mg by mouth 2 (two) times daily as needed for dizziness or nausea.   MULTIPLE VITAMINS-MINERALS (PRESERVISION AREDS) TABS    Take 1 tablet by mouth 2 (two) times daily.    NIACIN 500 MG CR CAPSULE    Take 500 mg by mouth 2 (two)  times daily.    ONDANSETRON (ZOFRAN) 4 MG TABLET    Take 1 tablet (4 mg total) by mouth every 8 (eight) hours as needed for nausea or vomiting.   PANTOPRAZOLE (PROTONIX) 40 MG TABLET    TAKE ONE TABLET TWICE DAILY   SODIUM CHLORIDE (OCEAN) 0.65 % SOLN NASAL SPRAY    Place 1 spray into both nostrils as needed for congestion.   TETRAHYDROZOLINE HCL (VISINE OP)    Place 2 drops into both eyes as needed (for dry eyes).   ZOLPIDEM (AMBIEN) 10 MG TABLET    Take 1 tablet (10 mg total) by mouth at bedtime as needed for sleep.  Modified Medications   No medications on file  Discontinued Medications   MECLIZINE (ANTIVERT) 12.5 MG TABLET    TAKE ONE OR TWO TABLETS EVERY EIGHT HOURS AS NEEDED FOR DIZZINESS   MULTIPLE VITAMIN (MULTIVITAMIN) TABLET    Take 1 tablet by mouth daily.      Review of Systems  Unable to perform ROS: Psychiatric disorder    Vitals:   06/05/16 1004  BP: 128/70  Pulse: 68  Temp: 97.7 F (36.5 C)  TempSrc: Oral  SpO2: 98%  Weight: 151 lb 6.4 oz (68.7 kg)  Height: _0  (1.727 m)   Body mass index is 23.02 kg/m.  Physical Exam  Constitutional: She appears well-developed and well-nourished. No distress.  Cardiovascular:  Right anterior lateral leg swelling but no palpable cords  Musculoskeletal: She exhibits edema and tenderness.  Right anterior lateral leg swelling and TTP but no cord palpable. Right knee swelling with reduced flexion; neg drawer test b/l. R>L PSIS TTP with paravertebral lumbar  muscle hypertrophy and ropy tissue texture changes  Neurological: She is alert. Gait abnormal.  Skin: Skin is warm and dry.  Psychiatric: She has a normal mood and affect. Her behavior is normal. Thought content normal.     Labs reviewed: Office Visit on 05/10/2016  Component Date Value Ref Range Status  . TSH 05/10/2016 1.87  mIU/L Final   Comment:   Reference Range   > or = 20 Years  0.40-4.50   Pregnancy Range First trimester  0.26-2.66 Second trimester 0.55-2.73 Third trimester  0.43-2.91     Admission on 05/04/2016, Discharged on 05/04/2016  Component Date Value Ref Range Status  . WBC 05/04/2016 6.0  4.0 - 10.5 K/uL Final  . RBC 05/04/2016 4.66  3.87 - 5.11 MIL/uL Final  . Hemoglobin 05/04/2016 13.8  12.0 - 15.0 g/dL Final  . HCT 05/04/2016 42.5  36.0 - 46.0 % Final  . MCV 05/04/2016 91.2  78.0 - 100.0 fL Final  . MCH 05/04/2016 29.6  26.0 - 34.0 pg Final  . MCHC 05/04/2016 32.5  30.0 - 36.0 g/dL Final  . RDW 05/04/2016 13.1  11.5 - 15.5 % Final  . Platelets 05/04/2016 215  150 - 400 K/uL Final  . Neutrophils Relative % 05/04/2016 68  % Final  . Neutro Abs 05/04/2016 4.1  1.7 - 7.7 K/uL Final  . Lymphocytes Relative 05/04/2016 19  % Final  . Lymphs Abs 05/04/2016 1.1  0.7 - 4.0 K/uL Final  . Monocytes Relative 05/04/2016 10  % Final  . Monocytes Absolute 05/04/2016 0.6  0.1 - 1.0 K/uL Final  . Eosinophils Relative 05/04/2016 2  % Final  . Eosinophils Absolute 05/04/2016 0.1  0.0 - 0.7 K/uL Final  . Basophils Relative 05/04/2016 1  % Final  . Basophils Absolute 05/04/2016 0.0  0.0 - 0.1 K/uL Final  .  Sodium 05/04/2016 138  135 - 145 mmol/L Final  . Potassium 05/04/2016 3.9  3.5 - 5.1 mmol/L Final  . Chloride 05/04/2016 101  101 - 111 mmol/L Final  . CO2 05/04/2016 28  22 - 32 mmol/L Final  . Glucose, Bld 05/04/2016 151* 65 - 99 mg/dL Final  . BUN 05/04/2016 11  6 - 20 mg/dL Final  . Creatinine, Ser 05/04/2016 0.69  0.44 - 1.00 mg/dL Final  . Calcium 05/04/2016  9.4  8.9 - 10.3 mg/dL Final  . GFR calc non Af Amer 05/04/2016 >60  >60 mL/min Final  . GFR calc Af Amer 05/04/2016 >60  >60 mL/min Final   Comment: (NOTE) The eGFR has been calculated using the CKD EPI equation. This calculation has not been validated in all clinical situations. eGFR's persistently <60 mL/min signify possible Chronic Kidney Disease.   . Anion gap 05/04/2016 9  5 - 15 Final  . Magnesium 05/04/2016 1.9  1.7 - 2.4 mg/dL Final  Office Visit on 03/15/2016  Component Date Value Ref Range Status  . Total Bilirubin 03/15/2016 0.5  0.2 - 1.2 mg/dL Final  . Bilirubin, Direct 03/15/2016 0.2  <=0.2 mg/dL Final  . Indirect Bilirubin 03/15/2016 0.3  0.2 - 1.2 mg/dL Final  . Alkaline Phosphatase 03/15/2016 115  33 - 130 U/L Final  . AST 03/15/2016 20  10 - 35 U/L Final  . ALT 03/15/2016 34* 6 - 29 U/L Final  . Total Protein 03/15/2016 6.4  6.1 - 8.1 g/dL Final  . Albumin 03/15/2016 3.7  3.6 - 5.1 g/dL Final  Office Visit on 03/07/2016  Component Date Value Ref Range Status  . WBC 03/07/2016 6.6  3.8 - 10.8 K/uL Final  . RBC 03/07/2016 3.97  3.80 - 5.10 MIL/uL Final  . Hemoglobin 03/07/2016 11.8  11.7 - 15.5 g/dL Final  . HCT 03/07/2016 36.4  35.0 - 45.0 % Final  . MCV 03/07/2016 91.7  80.0 - 100.0 fL Final  . MCH 03/07/2016 29.7  27.0 - 33.0 pg Final  . MCHC 03/07/2016 32.4  32.0 - 36.0 g/dL Final  . RDW 03/07/2016 13.6  11.0 - 15.0 % Final  . Platelets 03/07/2016 234  140 - 400 K/uL Final  . MPV 03/07/2016 9.9  7.5 - 12.5 fL Final  . Neutro Abs 03/07/2016 4092  1,500 - 7,800 cells/uL Final  . Lymphs Abs 03/07/2016 1518  850 - 3,900 cells/uL Final  . Monocytes Absolute 03/07/2016 594  200 - 950 cells/uL Final  . Eosinophils Absolute 03/07/2016 330  15 - 500 cells/uL Final  . Basophils Absolute 03/07/2016 66  0 - 200 cells/uL Final  . Neutrophils Relative % 03/07/2016 62  % Final  . Lymphocytes Relative 03/07/2016 23  % Final  . Monocytes Relative 03/07/2016 9  % Final  .  Eosinophils Relative 03/07/2016 5  % Final  . Basophils Relative 03/07/2016 1  % Final  . Smear Review 03/07/2016 Criteria for review not met   Final  . Sodium 03/07/2016 140  135 - 146 mmol/L Final  . Potassium 03/07/2016 3.8  3.5 - 5.3 mmol/L Final  . Chloride 03/07/2016 102  98 - 110 mmol/L Final  . CO2 03/07/2016 25  20 - 31 mmol/L Final  . Glucose, Bld 03/07/2016 181* 65 - 99 mg/dL Final  . BUN 03/07/2016 14  7 - 25 mg/dL Final  . Creat 03/07/2016 0.54* 0.60 - 0.88 mg/dL Final   Comment:   For patients > or = 81  years of age: The upper reference limit for Creatinine is approximately 13% higher for people identified as African-American.     . Total Bilirubin 03/07/2016 0.7  0.2 - 1.2 mg/dL Final  . Alkaline Phosphatase 03/07/2016 155* 33 - 130 U/L Final  . AST 03/07/2016 34  10 - 35 U/L Final  . ALT 03/07/2016 96* 6 - 29 U/L Final  . Total Protein 03/07/2016 6.3  6.1 - 8.1 g/dL Final  . Albumin 03/07/2016 3.5* 3.6 - 5.1 g/dL Final  . Calcium 03/07/2016 9.1  8.6 - 10.4 mg/dL Final  . GFR, Est African American 03/07/2016 >89  >=60 mL/min Final  . GFR, Est Non African American 03/07/2016 85  >=60 mL/min Final  . Lipase 03/07/2016 34  7 - 60 U/L Final  . Amylase 03/07/2016 57  0 - 105 U/L Final    Dg Knee Complete 4 Views Right  Result Date: 05/20/2016 CLINICAL DATA:  Chronic right knee pain, initial encounter EXAM: RIGHT KNEE - COMPLETE 4+ VIEW COMPARISON:  06/20/2013 FINDINGS: No acute fracture or dislocation is seen. No soft tissue abnormality is noted. Prior surgical changes are noted medially in the calf. No other focal abnormality is seen. IMPRESSION: No acute abnormality noted. Electronically Signed   By: Inez Catalina M.D.   On: 05/20/2016 16:59     Assessment/Plan   ICD-9-CM ICD-10-CM   1. Right leg swelling 729.81 M79.89 VAS [place] LOWER EXTREMITY VENOUS (DVT)  2. Paroxysmal atrial fibrillation (HCC) 427.31 I48.0 VAS [place] LOWER EXTREMITY VENOUS (DVT)  3. Chronic pain of  right knee 719.46 M25.561 traMADol (ULTRAM) 50 MG tablet   338.29 G89.29   4. Primary osteoarthritis involving multiple joints 715.09 M15.0 traMADol (ULTRAM) 50 MG tablet  5. Current use of long term anticoagulation V58.61 Z79.01 VAS [place] LOWER EXTREMITY VENOUS (DVT)   Recommend you use compression stockings or diabetic socks to improve swelling and pain in right leg  Will call with [place] appt  Continue other medications as ordered   Follow up with specialists as scheduled  Follow up as scheduled    Kairee Isa S. Perlie Gold  [facility] [address] [place], [place] [address] [phone] Cell (Monday-Friday 8 AM - 5 PM) [phone] After 5 PM and follow prompts

## 2016-06-06 ENCOUNTER — Ambulatory Visit (HOSPITAL_COMMUNITY)
Admission: RE | Admit: 2016-06-06 | Discharge: 2016-06-06 | Disposition: A | Discharge status: Home / Self Care | Payer: Medicare Other | Source: Ambulatory Visit | Attending: Internal Medicine | Admitting: Internal Medicine

## 2016-06-06 ENCOUNTER — Telehealth: Payer: Self-pay

## 2016-06-06 DIAGNOSIS — M7989 Other specified soft tissue disorders: Secondary | ICD-10-CM | POA: Diagnosis not present

## 2016-06-06 DIAGNOSIS — Z7901 Long term (current) use of anticoagulants: Secondary | ICD-10-CM | POA: Diagnosis not present

## 2016-06-06 DIAGNOSIS — I872 Venous insufficiency (chronic) (peripheral): Secondary | ICD-10-CM | POA: Diagnosis not present

## 2016-06-06 DIAGNOSIS — I480 Paroxysmal atrial fibrillation: Secondary | ICD-10-CM | POA: Diagnosis not present

## 2016-06-06 NOTE — Telephone Encounter (Signed)
A call came in from [facility] with patient report.  Patient is negative for right lower extremity DVT.  Patient is positive for Reflux.   No other impressions were given.

## 2016-06-06 NOTE — Telephone Encounter (Signed)
Noted. No DVT. Recommend TED stockings or diabetic socks to improve circulation. Follow up as scheduled

## 2016-06-06 NOTE — Telephone Encounter (Signed)
I spoke with patient and she verbalized understanding. She did not have any questions at this time.  

## 2016-06-17 ENCOUNTER — Other Ambulatory Visit: Payer: Self-pay | Admitting: Internal Medicine

## 2016-06-20 ENCOUNTER — Other Ambulatory Visit: Payer: Self-pay | Admitting: Internal Medicine

## 2016-06-24 ENCOUNTER — Telehealth: Payer: Self-pay | Admitting: *Deleted

## 2016-06-24 NOTE — Telephone Encounter (Signed)
Tried returning patient's call and phone just rings.

## 2016-06-24 NOTE — Telephone Encounter (Signed)
Patient called and left message on Clinical Intake Line stating that she needed a refill on a medication but lost the Rx bottle and could not remember what medication it is.  I tried calling patient back and the phone just rings. Will try again later. [phone]

## 2016-06-27 ENCOUNTER — Ambulatory Visit: Payer: Medicare Other | Admitting: Cardiovascular Disease

## 2016-06-27 NOTE — Progress Notes (Deleted)
Cardiology Office Note:    Date:  06/27/2016   ID:  Marissa Bowen, DOB 1927-03-30, MRN [id number]  PCP:  Marissa Cranker, DO  Cardiologist:  Dr. Loralie Bowen   Electrophysiologist:  n/a  Referring MD: Marissa Cranker, DO   No chief complaint on file.   History of Present Illness:    Marissa Bowen is a 83 y.o. female with a hx of CAD s/p CABG in 1997, PAF, HL, HTN.  She had recurrent AF in 2015. She has been managed with Apixaban for anticoagulation and Dronedarone for rhythm control.  She had to change Dronedarone to Amiodarone due to cost.  She had to change ACE inhibitor to angiotensin receptor blocker due to cough.  She is new to me previously seen by Dr Marissa Bowen and PA     CHADS2-VASc=6 (age, female, CVA, vascular dz). Creatinine clearance cannot be calculated (Patient's most recent lab result is older than the maximum 21 days allowed.)  Anticoagulation:  Apixaban (05/04/2016: Creatinine, Ser 0.69;  ) >> 5 mg bid  Rhythm agent - Amiodarone  She was seen in the ED 05/04/16 with symptomatic AF with RVR.  No intervention was undertaken in the ED.   She denies further rapid palpitations. She denies chest discomfort or shortness of breath. She denies syncope or near syncope. She denies orthopnea, PND or edema. She denies bleeding issues.  She has a lot of problems with balance. She also struggles with vertigo. She also has a lot of anxiety.   Metoprolol stopped last visit due to bradycardia  Prior CV studies that were reviewed today include:    Carotid US 3/17 Bilat ICA 1-39% >> FU prn  Myoview 11/30/13 Normal stress nuclear study LV Ejection Fraction: 72%. LV Wall Motion: NL LV Function; NL Wall Motion  Carotid US 06/30/13 bilat 1-39% ICA FU 2 years  Echo 04/03/12 Mild LVH, EF 60-65%, normal wall motion, grade 2 diastolic dysfunction, trivial AI, trivial MR, mild LAE, normal RV function, mild to moderate TR, PASP 37 mmHg (borderline pulmonary hypertension)  Event Monitor  11/2008 Normal   Past Medical History:  Diagnosis Date  . Anemia   . Anxiety   . BACK PAIN, LUMBAR 09/27/2009  . CAD (coronary artery disease)    a. s/p remote CABG 1997, 3V.;  b. LexiScan Myoview (8/15): No ischemia, EF 72%, normal study;  c. Echo 12/13 - EF 60-65%, no significant valvular abnormalities, normal RV size and systolic function.   . CVA (cerebral infarction)    a. Thalamic infarct 09/2009. //  b. hx of TIA in 2008  . Gastric ulcer   . GERD 11/25/2006  . GI bleed    a. 09/2009: secondary to antral ulcer.  . Hiatal hernia   . History of diverticulitis Dx 02/2012  . History of MI (myocardial infarction)   . HYPERLIPIDEMIA   . HYPERTENSION   . IBS 12/07/2008  . MIGRAINE WITH AURA 08/13/2007  . Mild carotid artery disease ([place])    a. 0-39% bilat 05/2011 (f/u recommended 05/2013). //  b. Carotid US 3/17 - Bilat ICA 1-39% >> FU prn  . Muscle weakness (generalized) 09/27/2009  . OSTEOARTHRITIS 08/13/2007  . PAF (paroxysmal atrial fibrillation) ([place]) 11/25/2006   a. Multaq >> changes to Amiodarone 2/2 $$  //  Eliquis (CHADS2-VASc = 6)  . Urinary, incontinence, stress female    wears Pessary  1. CAD: s/p CABG 1997. Echo (12/13) with EF 60-65%, no significant valvular abnormalities, normal RV size and systolic function. Lexiscan  Cardiolite (8/15) with EF 72%, no ischemia or infarction.  2. Paroxysmal atrial fibrillation noted since 11/13. Very symptomatic. She, of note, has a history of TIA in 2008. Was on dronedarone but stopped due to doughnut hole and now on amiodarone.  3. HTN: edema with amlodipine.  4. Hyperlipidemia: Unable to tolerate statins due to myalgias. 5. Migraines 6. GERD 7. IBS 8. Low back pain  Past Surgical History:  Procedure Laterality Date  . ABDOMINAL HYSTERECTOMY  1973  . CATARACT EXTRACTION W/ INTRAOCULAR LENS  IMPLANT, BILATERAL Bilateral 1999  . CHOLECYSTECTOMY N/A 01/03/2015   Procedure: LAPAROSCOPIC CHOLECYSTECTOMY WITH INTRAOPERATIVE  CHOLANGIOGRAM;  Surgeon: Donnie Mesa, MD;  Location: [facility];  Service: General;  Laterality: N/A;  . CORONARY ANGIOPLASTY  1997   Dr Lia Foyer  . CORONARY ARTERY BYPASS GRAFT  1997   Dr Melvenia Needles  . LAPAROSCOPIC CHOLECYSTECTOMY  01/03/2015  . TONSILLECTOMY      Current Medications: No outpatient prescriptions have been marked as taking for the 06/27/16 encounter (Appointment) with Marissa Hector, MD.     Allergies:   Asa [aspirin]; Crestor [rosuvastatin calcium]; Hydrocodone; Ibuprofen; Oxycodone hcl; Penicillins; Prednisone; Statins; and Pristiq [desvenlafaxine]   Social History   Social History  . Marital status: Widowed    Spouse name: N/A  . Number of children: N/A  . Years of education: N/A   Social History Main Topics  . Smoking status: Never Smoker  . Smokeless tobacco: Never Used  . Alcohol use No  . Drug use: No  . Sexual activity: No   Other Topics Concern  . Not on file   Social History Narrative  . No narrative on file     Family History:  The patient's family history includes Coronary artery disease in her sister; Diabetes in her sister; Heart attack in her mother and sister; Heart disease in her sister; Heart failure in her father; Throat cancer in her sister.   ROS:   Please see the history of present illness.    Review of Systems  Constitution: Positive for malaise/fatigue.  Eyes: Positive for visual disturbance.  Cardiovascular: Positive for irregular heartbeat.  Hematologic/Lymphatic: Bruises/bleeds easily.  Musculoskeletal: Positive for joint swelling.  Neurological: Positive for loss of balance.  Psychiatric/Behavioral: Positive for depression.   All other systems reviewed and are negative.   EKGs/Labs/Other Test Reviewed:    EKG:  EKG is  ordered today.  The ekg ordered today demonstrates sinus brady, HR 55, normal axis, QTc 470 ms, no sig changes  Recent Labs: 03/15/2016: ALT 34 05/04/2016: BUN 11; Creatinine, Ser 0.69; Hemoglobin 13.8;  Magnesium 1.9; Platelets 215; Potassium 3.9; Sodium 138 05/10/2016: TSH 1.87   Recent Lipid Panel    Component Value Date/Time   CHOL 175 01/29/2016 1000   CHOL 209 (H) 07/05/2015 0918   TRIG 111 01/29/2016 1000   HDL 50 01/29/2016 1000   HDL 61 07/05/2015 0918   CHOLHDL 3.5 01/29/2016 1000   VLDL 22 01/29/2016 1000   LDLCALC 103 01/29/2016 1000   LDLCALC 124 (H) 07/05/2015 0918     Physical Exam:    VS:  There were no vitals taken for this visit.    Wt Readings from Last 3 Encounters:  06/05/16 151 lb 6.4 oz (68.7 kg)  05/20/16 152 lb (68.9 kg)  05/10/16 151 lb (68.5 kg)     Physical Exam  Constitutional: She is oriented to person, place, and time. She appears well-developed and well-nourished. No distress.  HENT:  Head: Normocephalic and atraumatic.  Eyes: No scleral icterus.  Neck: No JVD present.  Cardiovascular: Normal rate, regular rhythm and normal heart sounds.   No murmur heard. Pulmonary/Chest: Effort normal. She has no wheezes. She has no rales.  Abdominal: Soft. There is no tenderness.  Musculoskeletal: She exhibits no edema.  Neurological: She is alert and oriented to person, place, and time.  Skin: Skin is warm and dry.  Psychiatric: She has a normal mood and affect.    ASSESSMENT:    No diagnosis found. PLAN:    In order of problems listed above:  1. Parox AF -  She is back in NSR.  Beta blocker stopped due to bradycardia  -  Continue Amiodarone, Apixaban.  -  Hgb 13.8 and SCr 0.69 in 1/18.    2. CAD - s/p CABG 1997.  No angina.  She is not on ASA as she is on Apixaban.    3. HTN - BP is controlled.   4. Amiodarone Rx - She remains on Amio 100 QD.  ALT in 11/17 was 34.  TSH in 1/18 was 1.87.  5. Balance disorder - She has fallen recently without significant injury.  She has a fear of falling.  She also has a hx of BPPV.  She takes prn Meclizine.  I have recommended referral to PT for balance evaluation.  She prefers to do this via home  health.  She will discuss further with her PCP.     Medication Adjustments/Labs and Tests Ordered: Current medicines are reviewed at length with the patient today.  Concerns regarding medicines are outlined above.  Medication changes, Labs and Tests ordered today are outlined in the Patient Instructions noted below. There are no Patient Instructions on file for this visit. Signed, Marissa Rouge, MD  06/27/2016 8:01 AM    Marissa Bowen, Marissa Bowen, Marissa Bowen  [id number] Phone: [phone]; Fax: [phone]

## 2016-07-03 ENCOUNTER — Other Ambulatory Visit: Payer: Self-pay | Admitting: Internal Medicine

## 2016-07-03 DIAGNOSIS — M25561 Pain in right knee: Principal | ICD-10-CM

## 2016-07-03 DIAGNOSIS — M150 Primary generalized (osteo)arthritis: Secondary | ICD-10-CM

## 2016-07-03 DIAGNOSIS — G8929 Other chronic pain: Secondary | ICD-10-CM

## 2016-07-03 DIAGNOSIS — M159 Polyosteoarthritis, unspecified: Secondary | ICD-10-CM

## 2016-07-04 ENCOUNTER — Other Ambulatory Visit: Payer: Self-pay | Admitting: Internal Medicine

## 2016-07-04 DIAGNOSIS — G4700 Insomnia, unspecified: Secondary | ICD-10-CM

## 2016-07-23 NOTE — Progress Notes (Signed)
Cardiology Office Note:    Date:  07/26/2016   ID:  TAMURA LASKY, DOB 1926-06-18, MRN [id number]  PCP:  Gildardo Cranker, DO  Cardiologist:  Dr. Loralie Champagne  -> [name]  Electrophysiologist:  n/a  Referring MD: Gildardo Cranker, DO   Chief Complaint  Patient presents with  . Coronary Artery Disease    History of Present Illness:    TAYLEN WENDLAND is a 83 y.o. female with a hx of CAD s/p CABG in 1997, PAF, HL, HTN.  She is new to me having seen Dr Aundra Dubin in past  She had recurrent AF in 2015. She has been managed with Apixaban for anticoagulation and Dronedarone for rhythm control.  She had to change Dronedarone to Amiodarone due to cost.  She had to change ACE inhibitor to angiotensin receptor blocker due to cough.  She is new to me previously seen by Dr Aundra Dubin and PA     CHADS2-VASc=6 (age, female, CVA, vascular dz). Creatinine clearance cannot be calculated (Patient's most recent lab result is older than the maximum 21 days allowed.)  Anticoagulation:  Apixaban (05/04/2016: Creatinine, Ser 0.69; Weight: 152 lb 12.8 oz (69.3 kg)) >> 5 mg bid  Rhythm agent - Amiodarone  She was seen in the ED 05/04/16 with symptomatic AF with RVR.  No intervention was undertaken in the ED.   She denies further rapid palpitations. She denies chest discomfort or shortness of breath. She denies syncope or near syncope. She denies orthopnea, PND or edema. She denies bleeding issues.  She has a lot of problems with balance. She also struggles with vertigo. She also has a lot of anxiety.   Metoprolol stopped last visit due to bradycardia  Prior CV studies that were reviewed today include:    Carotid US 3/17 Bilat ICA 1-39% >> FU prn  Myoview 11/30/13 Normal stress nuclear study LV Ejection Fraction: 72%. LV Wall Motion: NL LV Function; NL Wall Motion  Carotid US 06/30/13 bilat 1-39% ICA FU 2 years  Echo 04/03/12 Mild LVH, EF 60-65%, normal wall motion, grade 2 diastolic dysfunction, trivial AI, trivial  MR, mild LAE, normal RV function, mild to moderate TR, PASP 37 mmHg (borderline pulmonary hypertension)  Event Monitor 11/2008 Normal   Past Medical History:  Diagnosis Date  . Anemia   . Anxiety   . BACK PAIN, LUMBAR 09/27/2009  . CAD (coronary artery disease)    a. s/p remote CABG 1997, 3V.;  b. LexiScan Myoview (8/15): No ischemia, EF 72%, normal study;  c. Echo 12/13 - EF 60-65%, no significant valvular abnormalities, normal RV size and systolic function.   . CVA (cerebral infarction)    a. Thalamic infarct 09/2009. //  b. hx of TIA in 2008  . Gastric ulcer   . GERD 11/25/2006  . GI bleed    a. 09/2009: secondary to antral ulcer.  . Hiatal hernia   . History of diverticulitis Dx 02/2012  . History of MI (myocardial infarction)   . HYPERLIPIDEMIA   . HYPERTENSION   . IBS 12/07/2008  . MIGRAINE WITH AURA 08/13/2007  . Mild carotid artery disease ([place])    a. 0-39% bilat 05/2011 (f/u recommended 05/2013). //  b. Carotid US 3/17 - Bilat ICA 1-39% >> FU prn  . Muscle weakness (generalized) 09/27/2009  . OSTEOARTHRITIS 08/13/2007  . PAF (paroxysmal atrial fibrillation) ([place]) 11/25/2006   a. Multaq >> changes to Amiodarone 2/2 $$  //  Eliquis (CHADS2-VASc = 6)  . Urinary, incontinence, stress female  wears Pessary  1. CAD: s/p CABG 1997. Echo (12/13) with EF 60-65%, no significant valvular abnormalities, normal RV size and systolic function. Lexiscan Cardiolite (8/15) with EF 72%, no ischemia or infarction.  2. Paroxysmal atrial fibrillation noted since 11/13. Very symptomatic. She, of note, has a history of TIA in 2008. Was on dronedarone but stopped due to doughnut hole and now on amiodarone.  3. HTN: edema with amlodipine.  4. Hyperlipidemia: Unable to tolerate statins due to myalgias. 5. Migraines 6. GERD 7. IBS 8. Low back pain  Past Surgical History:  Procedure Laterality Date  . ABDOMINAL HYSTERECTOMY  1973  . CATARACT EXTRACTION W/ INTRAOCULAR LENS  IMPLANT, BILATERAL  Bilateral 1999  . CHOLECYSTECTOMY N/A 01/03/2015   Procedure: LAPAROSCOPIC CHOLECYSTECTOMY WITH INTRAOPERATIVE CHOLANGIOGRAM;  Surgeon: Donnie Mesa, MD;  Location: [facility];  Service: General;  Laterality: N/A;  . CORONARY ANGIOPLASTY  1997   Dr Lia Foyer  . CORONARY ARTERY BYPASS GRAFT  1997   Dr Melvenia Needles  . LAPAROSCOPIC CHOLECYSTECTOMY  01/03/2015  . TONSILLECTOMY      Current Medications: Current Meds  Medication Sig  . acetaminophen (TYLENOL) 500 MG tablet Take 500-1,000 mg by mouth every 6 (six) hours as needed for moderate pain.   Marland Kitchen amiodarone (PACERONE) 200 MG tablet Take 100 mg by mouth daily.  Marland Kitchen ELIQUIS 5 MG TABS tablet Take 1 tablet (5 mg total) by mouth 2 (two) times daily.  Marland Kitchen LORazepam (ATIVAN) 1 MG tablet TAKE ONE TABLET EVERY EIGHT HOURS AS NEEDED FOR ANXIETY  . losartan (COZAAR) 50 MG tablet Take 1 tablet (50 mg total) by mouth 2 (two) times daily.  . meclizine (ANTIVERT) 12.5 MG tablet TAKE ONE OR TWO TABLETS EVERY EIGHT HOURS AS NEEDED FOR DIZZINESS  . Multiple Vitamins-Minerals (PRESERVISION AREDS) TABS Take 1 tablet by mouth 2 (two) times daily.   . niacin 500 MG CR capsule Take 500 mg by mouth 2 (two) times daily.   . pantoprazole (PROTONIX) 40 MG tablet Take 40 mg by mouth 2 (two) times daily.  . sodium chloride (OCEAN) 0.65 % SOLN nasal spray Place 1 spray into both nostrils as needed for congestion.  . Tetrahydrozoline HCl (VISINE OP) Place 2 drops into both eyes as needed (for dry eyes).  . traMADol (ULTRAM) 50 MG tablet TAKE ONE TABLET EVERY EIGHT HOURS AS NEEDED FOR MODERATE PAIN TO SEVERE PAIN  . zolpidem (AMBIEN) 10 MG tablet TAKE ONE TABLET AT BEDTIME AS NEEDED FOR SLEEP  . [DISCONTINUED] ELIQUIS 5 MG TABS tablet Take 5 mg by mouth 2 (two) times daily.     Allergies:   Asa [aspirin]; Crestor [rosuvastatin calcium]; Hydrocodone; Ibuprofen; Oxycodone hcl; Penicillins; Prednisone; Statins; and Pristiq [desvenlafaxine]   Social History   Social History  . Marital  status: Widowed    Spouse name: N/A  . Number of children: N/A  . Years of education: N/A   Social History Main Topics  . Smoking status: Never Smoker  . Smokeless tobacco: Never Used  . Alcohol use No  . Drug use: No  . Sexual activity: No   Other Topics Concern  . None   Social History Narrative  . None     Family History:  The patient's family history includes Coronary artery disease in her sister; Diabetes in her sister; Heart attack in her mother and sister; Heart disease in her sister; Heart failure in her father; Throat cancer in her sister.   ROS:   Please see the history of present illness.  Review of Systems  Constitution: Positive for malaise/fatigue.  Eyes: Positive for visual disturbance.  Cardiovascular: Positive for irregular heartbeat.  Hematologic/Lymphatic: Bruises/bleeds easily.  Musculoskeletal: Positive for joint swelling.  Neurological: Positive for loss of balance.  Psychiatric/Behavioral: Positive for depression.   All other systems reviewed and are negative.   EKGs/Labs/Other Test Reviewed:    EKG:  EKG is  ordered today.  The ekg ordered today demonstrates sinus brady, HR 55, normal axis, QTc 470 ms, no sig changes  Recent Labs: 03/15/2016: ALT 34 05/04/2016: BUN 11; Creatinine, Ser 0.69; Hemoglobin 13.8; Magnesium 1.9; Platelets 215; Potassium 3.9; Sodium 138 05/10/2016: TSH 1.87   Recent Lipid Panel    Component Value Date/Time   CHOL 175 01/29/2016 1000   CHOL 209 (H) 07/05/2015 0918   TRIG 111 01/29/2016 1000   HDL 50 01/29/2016 1000   HDL 61 07/05/2015 0918   CHOLHDL 3.5 01/29/2016 1000   VLDL 22 01/29/2016 1000   LDLCALC 103 01/29/2016 1000   LDLCALC 124 (H) 07/05/2015 0918     Physical Exam:    VS:  BP (!) 116/52   Pulse 67   Ht 5\' 8"  (1.727 m)   Wt 152 lb 12.8 oz (69.3 kg)   SpO2 97%   BMI 23.23 kg/m     Wt Readings from Last 3 Encounters:  07/26/16 152 lb 12.8 oz (69.3 kg)  06/05/16 151 lb 6.4 oz (68.7 kg)    05/20/16 152 lb (68.9 kg)     Physical Exam  Constitutional: She is oriented to person, place, and time. She appears well-developed and well-nourished. No distress.  HENT:  Head: Normocephalic and atraumatic.  Eyes: No scleral icterus.  Neck: No JVD present.  Cardiovascular: Normal rate, regular rhythm and normal heart sounds.   No murmur heard. Pulmonary/Chest: Effort normal. She has no wheezes. She has no rales.  Abdominal: Soft. There is no tenderness.  Musculoskeletal: She exhibits no edema.  Neurological: She is alert and oriented to person, place, and time.  Skin: Skin is warm and dry.  Psychiatric: She has a normal mood and affect.    ASSESSMENT:    1. Coronary artery disease involving native coronary artery of native heart without angina pectoris    PLAN:    In order of problems listed above:  1. Parox AF -  She is back in NSR.  Beta blocker stopped due to bradycardia  -  Continue Amiodarone, Apixaban.  -  Hgb 13.8 and SCr 0.69 in 1/18.    2. CAD - s/p CABG 1997.  No angina.  She is not on ASA as she is on Apixaban.    3. HTN - BP is controlled.   4. Amiodarone Rx - She remains on Amio 100 QD.  ALT in 11/17 was 34.  TSH in 1/18 was 1.87.  5. Balance disorder - She has fallen recently without significant injury.  She has a fear of falling.  She also has a hx of BPPV.  She takes prn Meclizine.  I have recommended referral to PT for balance evaluation.  She prefers to do this via home health.  She will discuss further with her PCP.     [name]

## 2016-07-25 ENCOUNTER — Other Ambulatory Visit: Payer: Self-pay | Admitting: Internal Medicine

## 2016-07-26 ENCOUNTER — Other Ambulatory Visit: Payer: Self-pay | Admitting: Internal Medicine

## 2016-07-26 ENCOUNTER — Encounter: Payer: Self-pay | Admitting: Cardiovascular Disease

## 2016-07-26 ENCOUNTER — Ambulatory Visit (INDEPENDENT_AMBULATORY_CARE_PROVIDER_SITE_OTHER): Payer: Medicare Other | Admitting: Cardiovascular Disease

## 2016-07-26 VITALS — BP 116/52 | HR 67 | Ht 68.0 in | Wt 152.8 lb

## 2016-07-26 DIAGNOSIS — I2510 Atherosclerotic heart disease of native coronary artery without angina pectoris: Secondary | ICD-10-CM

## 2016-07-26 MED ORDER — ELIQUIS 5 MG PO TABS: 5.0000 mg | ORAL_TABLET | Freq: Two times a day (BID) | ORAL | 3 refills | Status: DC

## 2016-07-26 NOTE — Patient Instructions (Signed)

## 2016-07-29 ENCOUNTER — Encounter: Payer: Self-pay | Admitting: Internal Medicine

## 2016-07-29 ENCOUNTER — Ambulatory Visit (INDEPENDENT_AMBULATORY_CARE_PROVIDER_SITE_OTHER): Payer: Medicare Other | Admitting: Internal Medicine

## 2016-07-29 VITALS — BP 122/60 | HR 61 | Temp 98.9°F | Wt 151.0 lb

## 2016-07-29 DIAGNOSIS — I8393 Asymptomatic varicose veins of bilateral lower extremities: Secondary | ICD-10-CM

## 2016-07-29 DIAGNOSIS — I872 Venous insufficiency (chronic) (peripheral): Secondary | ICD-10-CM | POA: Diagnosis not present

## 2016-07-29 DIAGNOSIS — I2510 Atherosclerotic heart disease of native coronary artery without angina pectoris: Secondary | ICD-10-CM

## 2016-07-29 NOTE — Progress Notes (Signed)
Location:  [facility] clinic Provider: Draycen Leichter L. Mariea Clonts, D.O., C.M.D. PCP: Dr. Eulas Post  Goals of Care:  Advanced Directives 07/29/2016  Does Patient Have a Medical Advance Directive? No  Type of Advance Directive -  Does patient want to make changes to medical advance directive? -  Copy of [place] in Chart? No - copy requested  Would patient like information on creating a medical advance directive? -   Chief Complaint  Patient presents with  . Follow-up    right leg pain    HPI: Patient is a 83 y.o. female seen today for an acute visit for her legs being no better and wants to discuss what is going on with them. She has osteoarthritis, chronic pain of right knee.   Has history of right knee/leg pain. Taking tylenol, which does not help. Xray of right knee 2 weeks ago, no acute process. She has difficulty weight bearing due to pain. Advised to continue compression stockings use or diabetic socks to improve pain and swelling. Was negative for RLE DVT 06/06/16.   She saw her cardiologist recently. He seemed to think it was varicose veins causing her RLE pain. She has pain mainly in the right leg, lower on lateral side. Leg is sore to touch. No bleeding or open wounds. Pain is staying the same. No pain in her foot. No one has suggested any medication to take. Has not tried ice or heat. No swelling, but sometimes she will having swelling in her knee, causing pain,hence the xray. No numbness or tingling. Pain is worse before bed. Is able to sleep through the night. Is wearing her compression hose daily and that has helped. There is no pain to left leg, just the right. She thinks she is beginning to develop varicose veins to leg lower leg. She has had several falls, related to her balance issues. She is on eliquis, for afib. Is very frustrated, b/c the balance issues and leg pain. She drives and drove herself here. She doesn't have anyone to drive her. She has already had doppler to rule  out dvt.  Past Medical History:  Diagnosis Date  . Anemia   . Anxiety   . BACK PAIN, LUMBAR 09/27/2009  . CAD (coronary artery disease)    a. s/p remote CABG 1997, 3V.;  b. LexiScan Myoview (8/15): No ischemia, EF 72%, normal study;  c. Echo 12/13 - EF 60-65%, no significant valvular abnormalities, normal RV size and systolic function.   . CVA (cerebral infarction)    a. Thalamic infarct 09/2009. //  b. hx of TIA in 2008  . Gastric ulcer   . GERD 11/25/2006  . GI bleed    a. 09/2009: secondary to antral ulcer.  . Hiatal hernia   . History of diverticulitis Dx 02/2012  . History of MI (myocardial infarction)   . HYPERLIPIDEMIA   . HYPERTENSION   . IBS 12/07/2008  . MIGRAINE WITH AURA 08/13/2007  . Mild carotid artery disease ([place])    a. 0-39% bilat 05/2011 (f/u recommended 05/2013). //  b. Carotid US 3/17 - Bilat ICA 1-39% >> FU prn  . Muscle weakness (generalized) 09/27/2009  . OSTEOARTHRITIS 08/13/2007  . PAF (paroxysmal atrial fibrillation) ([place]) 11/25/2006   a. Multaq >> changes to Amiodarone 2/2 $$  //  Eliquis (CHADS2-VASc = 6)  . Urinary, incontinence, stress female    wears Pessary    Past Surgical History:  Procedure Laterality Date  . ABDOMINAL HYSTERECTOMY  1973  .  CATARACT EXTRACTION W/ INTRAOCULAR LENS  IMPLANT, BILATERAL Bilateral 1999  . CHOLECYSTECTOMY N/A 01/03/2015   Procedure: LAPAROSCOPIC CHOLECYSTECTOMY WITH INTRAOPERATIVE CHOLANGIOGRAM;  Surgeon: Donnie Mesa, MD;  Location: [facility];  Service: General;  Laterality: N/A;  . CORONARY ANGIOPLASTY  1997   Dr Lia Foyer  . CORONARY ARTERY BYPASS GRAFT  1997   Dr Melvenia Needles  . LAPAROSCOPIC CHOLECYSTECTOMY  01/03/2015  . TONSILLECTOMY      Allergies  Allergen Reactions  . Asa [Aspirin] Other (See Comments)    Bleeding ulcer  . Crestor [Rosuvastatin Calcium] Other (See Comments)    myalgia  . Hydrocodone Other (See Comments)    REACTION: migraines  . Ibuprofen Other (See Comments)    ulcers  . Oxycodone Hcl Other (See  Comments)    REACTION: migraines  . Penicillins Swelling and Rash    Has patient had a PCN reaction causing immediate rash, facial/tongue/throat swelling, SOB or lightheadedness with hypotension: Yes Has patient had a PCN reaction causing severe rash involving mucus membranes or skin necrosis: No Has patient had a PCN reaction that required hospitalization No Has patient had a PCN reaction occurring within the last 10 years: No If all of the above answers are "NO", then may proceed with Cephalosporin use.   . Prednisone Other (See Comments)    ulcers  . Statins Other (See Comments)    Aches and pains  . Pristiq [Desvenlafaxine] Other (See Comments)    Drowsiness and hangover sensation    Allergies as of 07/29/2016      Reactions   Asa [aspirin] Other (See Comments)   Bleeding ulcer   Crestor [rosuvastatin Calcium] Other (See Comments)   myalgia   Hydrocodone Other (See Comments)   REACTION: migraines   Ibuprofen Other (See Comments)   ulcers   Oxycodone Hcl Other (See Comments)   REACTION: migraines   Penicillins Swelling, Rash   Has patient had a PCN reaction causing immediate rash, facial/tongue/throat swelling, SOB or lightheadedness with hypotension: Yes Has patient had a PCN reaction causing severe rash involving mucus membranes or skin necrosis: No Has patient had a PCN reaction that required hospitalization No Has patient had a PCN reaction occurring within the last 10 years: No If all of the above answers are "NO", then may proceed with Cephalosporin use.   Prednisone Other (See Comments)   ulcers   Statins Other (See Comments)   Aches and pains   Pristiq [desvenlafaxine] Other (See Comments)   Drowsiness and hangover sensation      Medication List       Accurate as of 07/29/16  2:56 PM. Always use your most recent med list.          acetaminophen 500 MG tablet Commonly known as:  TYLENOL Take 500-1,000 mg by mouth every 6 (six) hours as needed for moderate  pain.   amiodarone 200 MG tablet Commonly known as:  PACERONE Take 100 mg by mouth daily.   ELIQUIS 5 MG Tabs tablet Generic drug:  apixaban Take 1 tablet (5 mg total) by mouth 2 (two) times daily.   LORazepam 1 MG tablet Commonly known as:  ATIVAN Take 1 tablet (1 mg total) by mouth 3 (three) times daily as needed for anxiety.   losartan 50 MG tablet Commonly known as:  COZAAR Take 1 tablet (50 mg total) by mouth 2 (two) times daily.   meclizine 12.5 MG tablet Commonly known as:  ANTIVERT TAKE ONE OR TWO TABLETS EVERY EIGHT HOURS AS NEEDED FOR DIZZINESS  niacin 500 MG CR capsule Take 500 mg by mouth 2 (two) times daily.   pantoprazole 40 MG tablet Commonly known as:  PROTONIX Take 40 mg by mouth 2 (two) times daily.   PRESERVISION AREDS Tabs Take 1 tablet by mouth 2 (two) times daily.   sodium chloride 0.65 % Soln nasal spray Commonly known as:  OCEAN Place 1 spray into both nostrils as needed for congestion.   traMADol 50 MG tablet Commonly known as:  ULTRAM TAKE ONE TABLET EVERY EIGHT HOURS AS NEEDED FOR MODERATE PAIN TO SEVERE PAIN   VISINE OP Place 2 drops into both eyes as needed (for dry eyes).   zolpidem 10 MG tablet Commonly known as:  AMBIEN TAKE ONE TABLET AT BEDTIME AS NEEDED FOR SLEEP       Review of Systems:  Review of Systems  Constitutional: Negative for chills, fever and malaise/fatigue.  Respiratory: Negative for shortness of breath.   Cardiovascular: Negative for chest pain, palpitations and leg swelling.       Right leg tender over varicose veins on shin  Musculoskeletal: Positive for falls.  Neurological: Negative for weakness.  Psychiatric/Behavioral: Negative for memory loss.    Health Maintenance  Topic Date Due  . TETANUS/TDAP  09/19/2016 (Originally 10/28/1945)  . PNA vac Low Risk Adult (2 of 2 - PPSV23) 09/21/2016 (Originally 09/30/2014)  . INFLUENZA VACCINE  11/27/2016  . DEXA SCAN  Completed    Physical Exam: Vitals:    07/29/16 1429  BP: 122/60  Pulse: 61  Temp: 98.9 F (37.2 C)  TempSrc: Oral  SpO2: 97%  Weight: 151 lb (68.5 kg)   Body mass index is 22.96 kg/m. Physical Exam  Constitutional: She is oriented to person, place, and time. She appears well-developed and well-nourished. No distress.  Cardiovascular: Normal rate, regular rhythm, normal heart sounds and intact distal pulses.   Varicose veins on right lateral shin that are tender to touch  Pulmonary/Chest: Effort normal and breath sounds normal. No respiratory distress.  Musculoskeletal: Normal range of motion.  Neurological: She is alert and oriented to person, place, and time.  Skin: Skin is warm and dry.  Seborrheic keratoses present   Psychiatric: She has a normal mood and affect.    Labs reviewed: Basic Metabolic Panel:  Recent Labs  08/10/15 1540 12/27/15 0849 03/07/16 1447 05/04/16 1258 05/10/16 1436  NA  --  139 140 138  --   K  --  4.4 3.8 3.9  --   CL  --  102 102 101  --   CO2  --  28 25 28   --   GLUCOSE  --  77 181* 151*  --   BUN  --  12 14 11   --   CREATININE  --  0.70 0.54* 0.69  --   CALCIUM  --  9.1 9.1 9.4  --   MG  --   --   --  1.9  --   TSH 1.85  --   --   --  1.87   Liver Function Tests:  Recent Labs  08/10/15 1540  01/29/16 1000 03/07/16 1447 03/15/16 1140  AST 15  --   --  34 20  ALT 13  < > 12 96* 34*  ALKPHOS 79  --   --  155* 115  BILITOT 0.4  --   --  0.7 0.5  PROT 6.9  --   --  6.3 6.4  ALBUMIN 4.1  --   --  3.5* 3.7  < > = values in this interval not displayed.  Recent Labs  03/07/16 1447  LIPASE 34  AMYLASE 57   No results for input(s): AMMONIA in the last 8760 hours. CBC:  Recent Labs  08/10/15 1540 03/07/16 1447 05/04/16 1258  WBC 6.2 6.6 6.0  NEUTROABS 3,472 4,092 4.1  HGB 12.3 11.8 13.8  HCT 36.8 36.4 42.5  MCV 90.9 91.7 91.2  PLT 229 234 215   Lipid Panel:  Recent Labs  12/27/15 0849 01/29/16 1000  CHOL 228* 175  HDL 62 50  LDLCALC 138* 103  TRIG  142 111  CHOLHDL 3.7 3.5   Lab Results  Component Value Date   HGBA1C (H) 09/29/2009    5.8 (NOTE)                                                                       According to the ADA Clinical Practice Recommendations for 2011, when HbA1c is used as a screening test:   >=6.5%   Diagnostic of Diabetes Mellitus           (if abnormal result  is confirmed)  5.7-6.4%   Increased risk of developing Diabetes Mellitus  References:Diagnosis and Classification of Diabetes Mellitus,Diabetes WOEH,2122,48(GNOIB 1):S62-S69 and Standards of Medical Care in         Diabetes - 2011,Diabetes Care,2011,34  (Suppl 1):S11-S61.  Assessment/Plan 1. Venous reflux -noted on venous doppler study on right leg  2. Varicose veins of both lower extremities -seems to be cause of pain in right lateral shin and doing better with compression hose -does not want surgical intervention unless it gets worse and she can't stand it -advised to cont the compression hose and monitor  3. Venous insufficiency of both lower extremities -cont current compression hose  Labs/tests ordered:  No orders of the defined types were placed in this encounter.  Next appt:  Prn, keep regular visit with Dr. Nelly Rout L. Alexine Pilant, D.O. [facility] [address], [place] [id number] Cell Phone (Mon-Fri 8am-5pm):  [phone] On Call:  [phone] & follow prompts after 5pm & weekends Office Phone:  [phone] Office Fax:  [phone]

## 2016-07-29 NOTE — Patient Instructions (Signed)
Varicose Veins Varicose veins are veins that have become enlarged and twisted. They are usually seen in the legs but can occur in other parts of the body as well. What are the causes? This condition is the result of valves in the veins not working properly. Valves in the veins help to return blood from the leg to the heart. If these valves are damaged, blood flows backward and backs up into the veins in the leg near the skin. This causes the veins to become larger. What increases the risk? People who are on their feet a lot, who are pregnant, or who are overweight are more likely to develop varicose veins. What are the signs or symptoms?  Bulging, twisted-appearing, bluish veins, most commonly found on the legs.  Leg pain or a feeling of heaviness. These symptoms may be worse at the end of the day.  Leg swelling.  Changes in skin color. How is this diagnosed? A health care provider can usually diagnose varicose veins by examining your legs. Your health care provider may also recommend an ultrasound of your leg veins. How is this treated? Most varicose veins can be treated at home.However, other treatments are available for people who have persistent symptoms or want to improve the cosmetic appearance of the varicose veins. These treatment options include:  Sclerotherapy. A solution is injected into the vein to close it off.  Laser treatment. A laser is used to heat the vein to close it off.  Radiofrequency vein ablation. An electrical current produced by radio waves is used to close off the vein.  Phlebectomy. The vein is surgically removed through small incisions made over the varicose vein.  Vein ligation and stripping. The vein is surgically removed through incisions made over the varicose vein after the vein has been tied (ligated). Follow these instructions at home:   Do not stand or sit in one position for long periods of time. Do not sit with your legs crossed. Rest with your  legs raised during the day.  Wear compression stockings as directed by your health care provider. These stockings help to prevent blood clots and reduce swelling in your legs.  Do not wear other tight, encircling garments around your legs, pelvis, or waist.  Walk as much as possible to increase blood flow.  Raise the foot of your bed at night with 2-inch blocks.  If you get a cut in the skin over the vein and the vein bleeds, lie down with your leg raised and press on it with a clean cloth until the bleeding stops. Then place a bandage (dressing) on the cut. See your health care provider if it continues to bleed. Contact a health care provider if:  The skin around your ankle starts to break down.  You have pain, redness, tenderness, or hard swelling in your leg over a vein.  You are uncomfortable because of leg pain. This information is not intended to replace advice given to you by your health care provider. Make sure you discuss any questions you have with your health care provider. Document Released: 01/23/2005 Document Revised: 09/21/2015 Document Reviewed: 10/17/2015 Elsevier Interactive Patient Education  2017 [employer] Chronic Venous Insufficiency Chronic venous insufficiency, also called venous stasis, is a condition that prevents blood from being pumped effectively through the veins in your legs. Blood may no longer be pumped effectively from the legs back to the heart. This condition can range from mild to severe. With proper treatment, you should be able to continue  with an active life. What are the causes? Chronic venous insufficiency occurs when the vein walls become stretched, weakened, or damaged, or when valves within the vein are damaged. Some common causes of this include:  High blood pressure inside the veins (venous hypertension).  Increased blood pressure in the leg veins from long periods of sitting or standing.  A blood clot that blocks blood flow in a vein  (deep vein thrombosis, DVT).  Inflammation of a vein (phlebitis) that causes a blood clot to form.  Tumors in the pelvis that cause blood to back up. What increases the risk? The following factors may make you more likely to develop this condition:  Having a family history of this condition.  Obesity.  Pregnancy.  Living without enough physical activity or exercise (sedentary lifestyle).  Smoking.  Having a job that requires long periods of standing or sitting in one place.  Being a certain age. Women in their 1s and 65s and men in their 61s are more likely to develop this condition. What are the signs or symptoms? Symptoms of this condition include:  Veins that are enlarged, bulging, or twisted (varicose veins).  Skin breakdown or ulcers.  Reddened or discolored skin on the front of the leg.  Brown, smooth, tight, and painful skin just above the ankle, usually on the inside of the leg (lipodermatosclerosis).  Swelling. How is this diagnosed? This condition may be diagnosed based on:  Your medical history.  A physical exam.  Tests, such as:  A procedure that creates an image of a blood vessel and nearby organs and provides information about blood flow through the blood vessel (duplex ultrasound).  A procedure that tests blood flow (plethysmography).  A procedure to look at the veins using X-ray and dye (venogram). How is this treated? The goals of treatment are to help you return to an active life and to minimize pain or disability. Treatment depends on the severity of your condition, and it may include:  Wearing compression stockings. These can help relieve symptoms and help prevent your condition from getting worse. However, they do not cure the condition.  Sclerotherapy. This is a procedure involving an injection of a material that "dissolves" damaged veins.  Surgery. This may involve:  Removing a diseased vein (vein stripping).  Cutting off blood flow  through the vein (laser ablation surgery).  Repairing a valve. Follow these instructions at home:  Wear compression stockings as told by your health care provider. These stockings help to prevent blood clots and reduce swelling in your legs.  Take over-the-counter and prescription medicines only as told by your health care provider.  Stay active by exercising, walking, or doing different activities. Ask your health care provider what activities are safe for you and how much exercise you need.  Drink enough fluid to keep your urine clear or pale yellow.  Do not use any products that contain nicotine or tobacco, such as cigarettes and e-cigarettes. If you need help quitting, ask your health care provider.  Keep all follow-up visits as told by your health care provider. This is important. Contact a health care provider if:  You have redness, swelling, or more pain in the affected area.  You see a red streak or line that extends up or down from the affected area.  You have skin breakdown or a loss of skin in the affected area, even if the breakdown is small.  You get an injury in the affected area. Get help right away if:  You get an injury and an open wound in the affected area.  You have severe pain that does not get better with medicine.  You have sudden numbness or weakness in the foot or ankle below the affected area, or you have trouble moving your foot or ankle.  You have a fever and you have worse or persistent symptoms.  You have chest pain.  You have shortness of breath. Summary  Chronic venous insufficiency, also called venous stasis, is a condition that prevents blood from being pumped effectively through the veins in your legs.  Chronic venous insufficiency occurs when the vein walls become stretched, weakened, or damaged, or when valves within the vein are damaged.  Treatment for this condition depends on how severe your condition is, and it may involve wearing  compression stockings or having a procedure.  Make sure you stay active by exercising, walking, or doing different activities. Ask your health care provider what activities are safe for you and how much exercise you need. This information is not intended to replace advice given to you by your health care provider. Make sure you discuss any questions you have with your health care provider. Document Released: 08/19/2006 Document Revised: 03/04/2016 Document Reviewed: 03/04/2016 Elsevier Interactive Patient Education  2017 [employer].

## 2016-07-29 NOTE — Telephone Encounter (Signed)
rx called into pharmacy

## 2016-08-01 ENCOUNTER — Other Ambulatory Visit: Payer: Self-pay | Admitting: Nurse Practitioner

## 2016-08-01 ENCOUNTER — Other Ambulatory Visit: Payer: Self-pay | Admitting: Internal Medicine

## 2016-08-01 DIAGNOSIS — G4700 Insomnia, unspecified: Secondary | ICD-10-CM

## 2016-08-08 ENCOUNTER — Telehealth: Payer: Self-pay

## 2016-08-08 NOTE — Telephone Encounter (Signed)
Patient called clinical intake c/o trouble falling asleep.  Patient states that most nights she takes Ambien 10 mg and will lay around for hours before she is able to fall asleep.   Patient stated that last night she took an additional Ambien after a few hours of not falling asleep. I strongly advised patient that she not do so in the future for this medication can be dangerous at her age if taken other than how it was prescribed.   Once patient gets to sleep she is ok  Patient requested that I send message to Dr.Reed (seen on 07/29/16) in Dr.Carter's absence. Patient would like an alternative or something she can take in addition to assist with a better nights rest   Please advise

## 2016-08-08 NOTE — Telephone Encounter (Signed)
She could be tried on belsomra instead of ambien 10mg .  I agree she definitely should NEVER take two of her 10mg  ambien.  She should try belsomra 10mg  qhs and take it for at least 3-4 nights before deciding if it works b/c it will take a few nights to reach a therapeutic state.  She may also need a higher dose so if she is still not sleeping well after 3-4 nights, we can go to 20mg .  Please give her samples.

## 2016-08-09 ENCOUNTER — Other Ambulatory Visit: Payer: Self-pay | Admitting: Internal Medicine

## 2016-08-09 MED ORDER — SUVOREXANT 10 MG PO TABS: 1.0000 | ORAL_TABLET | Freq: Every day | ORAL | 0 refills | Status: DC

## 2016-08-09 NOTE — Telephone Encounter (Signed)
Spoke with patient, patient is willing to try belsomra, samples placed at the front.

## 2016-08-30 ENCOUNTER — Other Ambulatory Visit: Payer: Self-pay | Admitting: Internal Medicine

## 2016-08-30 ENCOUNTER — Telehealth: Payer: Self-pay | Admitting: *Deleted

## 2016-08-30 MED ORDER — ZOLPIDEM TARTRATE 10 MG PO TABS: 10.0000 mg | ORAL_TABLET | Freq: Every evening | ORAL | 0 refills | Status: DC | PRN

## 2016-08-30 NOTE — Telephone Encounter (Signed)
See phone note 08/08/16, Lorrin Mais not working any more

## 2016-08-30 NOTE — Telephone Encounter (Signed)
Patient called back and stated that she rather have her Ambien instead of Belsomra. Patient is not interested in changing at this time and wants refill on Ambien that she has been taking. Samples of Belsomra were still at front desk, placed them back in sample closet. Patient will discuss at next appointment. Patient aware to take the Rx as Directed.  Rx printed for Ambien and faxed to pharmacy.

## 2016-09-05 DIAGNOSIS — H26491 Other secondary cataract, right eye: Secondary | ICD-10-CM | POA: Diagnosis not present

## 2016-09-05 DIAGNOSIS — H353131 Nonexudative age-related macular degeneration, bilateral, early dry stage: Secondary | ICD-10-CM | POA: Diagnosis not present

## 2016-09-05 DIAGNOSIS — Z961 Presence of intraocular lens: Secondary | ICD-10-CM | POA: Diagnosis not present

## 2016-09-11 ENCOUNTER — Other Ambulatory Visit: Payer: Self-pay | Admitting: Cardiology

## 2016-09-11 ENCOUNTER — Other Ambulatory Visit: Payer: Self-pay | Admitting: Internal Medicine

## 2016-09-13 ENCOUNTER — Ambulatory Visit (INDEPENDENT_AMBULATORY_CARE_PROVIDER_SITE_OTHER): Payer: Medicare Other | Admitting: Internal Medicine

## 2016-09-13 ENCOUNTER — Encounter: Payer: Self-pay | Admitting: Internal Medicine

## 2016-09-13 VITALS — BP 138/72 | HR 67 | Temp 98.1°F | Ht 68.0 in | Wt 151.4 lb

## 2016-09-13 DIAGNOSIS — I8393 Asymptomatic varicose veins of bilateral lower extremities: Secondary | ICD-10-CM

## 2016-09-13 DIAGNOSIS — I872 Venous insufficiency (chronic) (peripheral): Secondary | ICD-10-CM

## 2016-09-13 DIAGNOSIS — F418 Other specified anxiety disorders: Secondary | ICD-10-CM

## 2016-09-13 DIAGNOSIS — K219 Gastro-esophageal reflux disease without esophagitis: Secondary | ICD-10-CM

## 2016-09-13 DIAGNOSIS — I2510 Atherosclerotic heart disease of native coronary artery without angina pectoris: Secondary | ICD-10-CM

## 2016-09-13 MED ORDER — DULOXETINE HCL 30 MG PO CPEP: 30.0000 mg | ORAL_CAPSULE | Freq: Every day | ORAL | 3 refills | Status: DC

## 2016-09-13 MED ORDER — TRIAMCINOLONE ACETONIDE 0.1 % EX LOTN: 1.0000 "application " | TOPICAL_LOTION | Freq: Every day | CUTANEOUS | 1 refills | Status: DC | PRN
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
## 2016-09-13 NOTE — Patient Instructions (Addendum)
Continue compression stockings to both legs while up and about for varicose veins/leg pain  Use steroid cream daily to legs for rash  Start cymbalta 30mg  daily for anxiety  Continue other medications as ordered  Follow up in 1 mo for anxiety/depression    Chronic Venous Insufficiency Chronic venous insufficiency, also called venous stasis, is a condition that prevents blood from being pumped effectively through the veins in your legs. Blood may no longer be pumped effectively from the legs back to the heart. This condition can range from mild to severe. With proper treatment, you should be able to continue with an active life. What are the causes? Chronic venous insufficiency occurs when the vein walls become stretched, weakened, or damaged, or when valves within the vein are damaged. Some common causes of this include:  High blood pressure inside the veins (venous hypertension).  Increased blood pressure in the leg veins from long periods of sitting or standing.  A blood clot that blocks blood flow in a vein (deep vein thrombosis, DVT).  Inflammation of a vein (phlebitis) that causes a blood clot to form.  Tumors in the pelvis that cause blood to back up. What increases the risk? The following factors may make you more likely to develop this condition:  Having a family history of this condition.  Obesity.  Pregnancy.  Living without enough physical activity or exercise (sedentary lifestyle).  Smoking.  Having a job that requires long periods of standing or sitting in one place.  Being a certain age. Women in their 46s and 20s and men in their 22s are more likely to develop this condition. What are the signs or symptoms? Symptoms of this condition include:  Veins that are enlarged, bulging, or twisted (varicose veins).  Skin breakdown or ulcers.  Reddened or discolored skin on the front of the leg.  Brown, smooth, tight, and painful skin just above the ankle,  usually on the inside of the leg (lipodermatosclerosis).  Swelling. How is this diagnosed? This condition may be diagnosed based on:  Your medical history.  A physical exam.  Tests, such as:  A procedure that creates an image of a blood vessel and nearby organs and provides information about blood flow through the blood vessel (duplex ultrasound).  A procedure that tests blood flow (plethysmography).  A procedure to look at the veins using X-ray and dye (venogram). How is this treated? The goals of treatment are to help you return to an active life and to minimize pain or disability. Treatment depends on the severity of your condition, and it may include:  Wearing compression stockings. These can help relieve symptoms and help prevent your condition from getting worse. However, they do not cure the condition.  Sclerotherapy. This is a procedure involving an injection of a material that "dissolves" damaged veins.  Surgery. This may involve:  Removing a diseased vein (vein stripping).  Cutting off blood flow through the vein (laser ablation surgery).  Repairing a valve. Follow these instructions at home:  Wear compression stockings as told by your health care provider. These stockings help to prevent blood clots and reduce swelling in your legs.  Take over-the-counter and prescription medicines only as told by your health care provider.  Stay active by exercising, walking, or doing different activities. Ask your health care provider what activities are safe for you and how much exercise you need.  Drink enough fluid to keep your urine clear or pale yellow.  Do not use any products that  contain nicotine or tobacco, such as cigarettes and e-cigarettes. If you need help quitting, ask your health care provider.  Keep all follow-up visits as told by your health care provider. This is important. Contact a health care provider if:  You have redness, swelling, or more pain in the  affected area.  You see a red streak or line that extends up or down from the affected area.  You have skin breakdown or a loss of skin in the affected area, even if the breakdown is small.  You get an injury in the affected area. Get help right away if:  You get an injury and an open wound in the affected area.  You have severe pain that does not get better with medicine.  You have sudden numbness or weakness in the foot or ankle below the affected area, or you have trouble moving your foot or ankle.  You have a fever and you have worse or persistent symptoms.  You have chest pain.  You have shortness of breath. Summary  Chronic venous insufficiency, also called venous stasis, is a condition that prevents blood from being pumped effectively through the veins in your legs.  Chronic venous insufficiency occurs when the vein walls become stretched, weakened, or damaged, or when valves within the vein are damaged.  Treatment for this condition depends on how severe your condition is, and it may involve wearing compression stockings or having a procedure.  Make sure you stay active by exercising, walking, or doing different activities. Ask your health care provider what activities are safe for you and how much exercise you need. This information is not intended to replace advice given to you by your health care provider. Make sure you discuss any questions you have with your health care provider. Document Released: 08/19/2006 Document Revised: 03/04/2016 Document Reviewed: 03/04/2016 Elsevier Interactive Patient Education  2017 [employer].

## 2016-09-16 ENCOUNTER — Telehealth: Payer: Self-pay | Admitting: Cardiovascular Disease

## 2016-09-16 NOTE — Telephone Encounter (Signed)
Patient calling, states that she was having issues with her right leg. Patient states that she had irritated skin and believes she has blood clots. Patient was sent to cardiovascular and had XRAY, they did not find any clots this happened about three weeks ago. Patient is calling now, because now both legs are bothering her again and she wanted an appt with Dr. [name], he did not have any recent openings and patient declined appt with PA.Thanks.

## 2016-09-16 NOTE — Telephone Encounter (Signed)
Patient complaining of BLE pain and redness. Patient recently had BLE ultrasound to rule out DVT- per Dr. Vale Haven office visit note on 09/13/16 -" Feb 2018 venous doppler US neg for DVT but did show varicose veins and venous reflux."  Patient is wanting an appointment to discuss this with Dr. [name]. Informed patient that Dr. Kyla Balzarine first available is in August. Patient stated she could not wait that long. Patient agreed to see Cecilie Kicks NP next week to be evaulated. Patient has history of PAF. Informed patient that she is on eliquis and this medication is used to help prevent blood clots. Patient verbalized understanding.

## 2016-09-17 ENCOUNTER — Other Ambulatory Visit: Payer: Self-pay | Admitting: Internal Medicine

## 2016-09-25 ENCOUNTER — Other Ambulatory Visit: Payer: Self-pay | Admitting: Nurse Practitioner

## 2016-09-26 ENCOUNTER — Encounter: Payer: Self-pay | Admitting: Cardiology

## 2016-09-26 ENCOUNTER — Ambulatory Visit (INDEPENDENT_AMBULATORY_CARE_PROVIDER_SITE_OTHER): Payer: Medicare Other | Admitting: Cardiology

## 2016-09-26 VITALS — BP 110/50 | HR 65 | Ht 68.0 in | Wt 152.8 lb

## 2016-09-26 DIAGNOSIS — Z7901 Long term (current) use of anticoagulants: Secondary | ICD-10-CM | POA: Diagnosis not present

## 2016-09-26 DIAGNOSIS — I2510 Atherosclerotic heart disease of native coronary artery without angina pectoris: Secondary | ICD-10-CM

## 2016-09-26 DIAGNOSIS — I872 Venous insufficiency (chronic) (peripheral): Secondary | ICD-10-CM

## 2016-09-26 DIAGNOSIS — I480 Paroxysmal atrial fibrillation: Secondary | ICD-10-CM | POA: Diagnosis not present

## 2016-09-26 DIAGNOSIS — I83813 Varicose veins of bilateral lower extremities with pain: Secondary | ICD-10-CM

## 2016-09-26 NOTE — Patient Instructions (Signed)
Medication Instructions:  Your physician recommends that you continue on your current medications as directed. Please refer to the Current Medication list given to you today.  Follow-Up: Your physician recommends that you schedule a follow-up appointment in: 4 months with Dr. [name]   Any Other Special Instructions Will Be Listed Below (If Applicable).     If you need a refill on your cardiac medications before your next appointment, please call your pharmacy.

## 2016-09-26 NOTE — Progress Notes (Signed)
Cardiology Office Note   Date:  09/26/2016   ID:  Marissa Bowen, DOB 1926-12-14, MRN [id number]  PCP:  Gildardo Cranker, DO  Cardiologist:  Dr. [name]   Chief Complaint  Patient presents with  . Leg Pain    venous insuff.      History of Present Illness: Marissa Bowen is a 83 y.o. female who presents for BLE pain and redness.  Recent venous doppler's neg for DVT + varicose veins and venous reflux.  Pt wanted to discuss with Dr. [name] she has seen her PCP for this as well and she is wearing compression stockings.   hx of CAD s/p CABG in 1997, PAF, HL, HTN.  She is new to me having seen Dr Aundra Dubin in past She had recurrent AF in 2015. She has been managed with Apixaban for anticoagulation and Dronedarone for rhythm control. She had to change Dronedarone to Amiodarone due to cost. She had to change ACE inhibitor to angiotensin receptor blocker due to cough.  She is new to me previously seen by Dr Aundra Dubin and PA     CHADS2-VASc=6 (age, female, CVA, vascular dz). Creatinine clearance cannot be calculated (Patient's most recent lab result is older than the maximum 21 days allowed.)  Anticoagulation: Apixaban (05/04/2016: Creatinine, Ser 0.69; Weight: 152 lb 12.8 oz (69.3 kg)) >> 5 mg bid  Rhythm agent - Amiodarone  Today she is most concerned about strokes. We dsicussed that she is on eliquis so she is protected from strokes, DVT and PE.  We discussed venous insuff.  She believes she had laser treatment on [address] [facility]  I do not see a note from them all I see from them is venous doppler.   She has no chest pain or SOB.  She is off balance with ambulation.  She uses a cane at times.    Past Medical History:  Diagnosis Date  . Anemia   . Anxiety   . BACK PAIN, LUMBAR 09/27/2009  . CAD (coronary artery disease)    a. s/p remote CABG 1997, 3V.;  b. LexiScan Myoview (8/15): No ischemia, EF 72%, normal study;  c. Echo 12/13 - EF 60-65%, no significant valvular  abnormalities, normal RV size and systolic function.   . CVA (cerebral infarction)    a. Thalamic infarct 09/2009. //  b. hx of TIA in 2008  . Gastric ulcer   . GERD 11/25/2006  . GI bleed    a. 09/2009: secondary to antral ulcer.  . Hiatal hernia   . History of diverticulitis Dx 02/2012  . History of MI (myocardial infarction)   . HYPERLIPIDEMIA   . HYPERTENSION   . IBS 12/07/2008  . MIGRAINE WITH AURA 08/13/2007  . Mild carotid artery disease ([employer])    a. 0-39% bilat 05/2011 (f/u recommended 05/2013). //  b. Carotid US 3/17 - Bilat ICA 1-39% >> FU prn  . Muscle weakness (generalized) 09/27/2009  . OSTEOARTHRITIS 08/13/2007  . PAF (paroxysmal atrial fibrillation) ([facility]) 11/25/2006   a. Multaq >> changes to Amiodarone 2/2 $$  //  Eliquis (CHADS2-VASc = 6)  . Urinary, incontinence, stress female    wears Pessary    Past Surgical History:  Procedure Laterality Date  . ABDOMINAL HYSTERECTOMY  1973  . CATARACT EXTRACTION W/ INTRAOCULAR LENS  IMPLANT, BILATERAL Bilateral 1999  . CHOLECYSTECTOMY N/A 01/03/2015   Procedure: LAPAROSCOPIC CHOLECYSTECTOMY WITH INTRAOPERATIVE CHOLANGIOGRAM;  Surgeon: Donnie Mesa, MD;  Location: [facility];  Service: General;  Laterality: N/A;  . CORONARY ANGIOPLASTY  1997   Dr Lia Foyer  . CORONARY ARTERY BYPASS GRAFT  1997   Dr Melvenia Needles  . LAPAROSCOPIC CHOLECYSTECTOMY  01/03/2015  . TONSILLECTOMY       Current Outpatient Prescriptions  Medication Sig Dispense Refill  . acetaminophen (TYLENOL) 500 MG tablet Take 500-1,000 mg by mouth every 6 (six) hours as needed for moderate pain.     Marland Kitchen amiodarone (PACERONE) 200 MG tablet Take 100 mg by mouth daily.    . DULoxetine (CYMBALTA) 30 MG capsule Take 1 capsule (30 mg total) by mouth daily. 30 capsule 3  . ELIQUIS 5 MG TABS tablet Take 1 tablet (5 mg total) by mouth 2 (two) times daily. 180 tablet 3  . LORazepam (ATIVAN) 1 MG tablet TAKE ONE TABLET EVERY EIGHT HOURS AS NEEDED FOR ANXIETY 90 tablet 0  . losartan (COZAAR) 50  MG tablet TAKE ONE TABLET TWICE DAILY 60 tablet 6  . meclizine (ANTIVERT) 12.5 MG tablet TAKE 1 OR 2 TABLETS EVERY EIGHT HOURS ASNEEDED FOR DIZZINESS 90 tablet 0  . Multiple Vitamins-Calcium (DAILY VITAMINS FOR WOMEN PO) Take 1 tablet by mouth daily.    . Multiple Vitamins-Minerals (PRESERVISION AREDS) TABS Take 1 tablet by mouth 2 (two) times daily.     . niacin 500 MG CR capsule Take 500 mg by mouth 2 (two) times daily.     . pantoprazole (PROTONIX) 40 MG tablet TAKE ONE TABLET TWICE DAILY 180 tablet 2  . sodium chloride (OCEAN) 0.65 % SOLN nasal spray Place 1 spray into both nostrils as needed for congestion.    . Tetrahydrozoline HCl (VISINE OP) Place 2 drops into both eyes as needed (for dry eyes).    . traMADol (ULTRAM) 50 MG tablet TAKE ONE TABLET EVERY EIGHT HOURS AS NEEDED FOR MODERATE PAIN TO SEVERE PAIN 90 tablet 0  . triamcinolone lotion (KENALOG) 0.1 % Apply 1 application topically daily as needed. 60 mL 1  . zolpidem (AMBIEN) 10 MG tablet Take 1 tablet (10 mg total) by mouth at bedtime as needed for sleep. 30 tablet 0   No current facility-administered medications for this visit.     Allergies:   Asa [aspirin]; Crestor [rosuvastatin calcium]; Hydrocodone; Ibuprofen; Oxycodone hcl; Penicillins; Prednisone; Statins; and Pristiq [desvenlafaxine]    Social History:  The patient  reports that she has never smoked. She has never used smokeless tobacco. She reports that she does not drink alcohol or use drugs.   Family History:  The patient's family history includes Coronary artery disease in her sister; Diabetes in her sister; Heart attack in her mother and sister; Heart disease in her sister; Heart failure in her father; Throat cancer in her sister.    ROS:  General:no colds or fevers, no weight changes Skin:+ rash left leg now improved no ulcers CV:see HPI PUL:see HPI GI:no diarrhea constipation or melena, no indigestion GU:no hematuria, no dysuria Neuro:no syncope, no  lightheadedness   Wt Readings from Last 3 Encounters:  09/26/16 152 lb 12.8 oz (69.3 kg)  09/13/16 151 lb 6.4 oz (68.7 kg)  07/29/16 151 lb (68.5 kg)     PHYSICAL EXAM: VS:  BP (!) 110/50   Pulse 65   Ht 5\' 8"  (1.727 m)   Wt 152 lb 12.8 oz (69.3 kg)   SpO2 97%   BMI 23.23 kg/m  , BMI Body mass index is 23.23 kg/m. General:Pleasant affect, NAD Skin:Warm and dry, brisk capillary refill HEENT:normocephalic, sclera clear, mucus membranes moist Neck:supple,  no JVD, no bruits  Heart:S1S2 RRR without murmur, gallup, rub or click Lungs:clear without rales, rhonchi, or wheezes IRW:ERXV, non tender, + BS, do not palpate liver spleen or masses Ext:no lower ext edema, 1+ pedal pulses, 2+ radial pulses Neuro:alert and oriented x 3, MAE, follows commands, + facial symmetry    EKG:  EKG is NOT ordered today.   Recent Labs: 03/15/2016: ALT 34 05/04/2016: BUN 11; Creatinine, Ser 0.69; Hemoglobin 13.8; Magnesium 1.9; Platelets 215; Potassium 3.9; Sodium 138 05/10/2016: TSH 1.87    Lipid Panel    Component Value Date/Time   CHOL 175 01/29/2016 1000   CHOL 209 (H) 07/05/2015 0918   TRIG 111 01/29/2016 1000   HDL 50 01/29/2016 1000   HDL 61 07/05/2015 0918   CHOLHDL 3.5 01/29/2016 1000   VLDL 22 01/29/2016 1000   LDLCALC 103 01/29/2016 1000   LDLCALC 124 (H) 07/05/2015 0918       Other studies Reviewed: Additional studies/ records that were reviewed today include:   Venous doppler with no DVT and + venous reflux..   ASSESSMENT AND PLAN:  1.  Venous insuff.  Reassured that she is protected from CVA on Eliquis but not to miss any Eliquis.  If she has not seen a vein specialist Dr. Donnetta Hutching or Dr. Donzetta Matters may be helpful if they thought ablation may help- pt believes she has had laser, will send note to PCP.    2. Varicosities.   3. PAF on Eliquis    Current medicines are reviewed with the patient today.  The patient Has no concerns regarding medicines.  The following changes  have been made:  See above Labs/ tests ordered today include:see above  Disposition:   FU:  see above  Signed, Cecilie Kicks, NP  09/26/2016 1:53 PM    [facility] [place], [place], [place] Phone: [phone]; Fax: [phone]

## 2016-10-03 ENCOUNTER — Other Ambulatory Visit: Payer: Self-pay | Admitting: *Deleted

## 2016-10-03 ENCOUNTER — Other Ambulatory Visit: Payer: Self-pay | Admitting: Internal Medicine

## 2016-10-03 DIAGNOSIS — N390 Urinary tract infection, site not specified: Secondary | ICD-10-CM | POA: Diagnosis not present

## 2016-10-03 MED ORDER — MECLIZINE HCL 12.5 MG PO TABS: ORAL_TABLET | ORAL | 0 refills | Status: DC

## 2016-10-03 NOTE — Telephone Encounter (Signed)
Pharmacist with [facility] called and stated that patient is due for a Refill on her Meclizine. LR was 5/6 but was only for a 15 days supply.  Refill faxed to pharmacy.

## 2016-10-04 ENCOUNTER — Inpatient Hospital Stay (HOSPITAL_COMMUNITY)
Admission: EM | Admit: 2016-10-04 | Discharge: 2016-10-07 | DRG: 690 | Disposition: A | Discharge status: Home / Self Care | Payer: Medicare Other | Attending: Internal Medicine | Admitting: Internal Medicine

## 2016-10-04 ENCOUNTER — Observation Stay (HOSPITAL_COMMUNITY): Payer: Medicare Other

## 2016-10-04 ENCOUNTER — Encounter (HOSPITAL_COMMUNITY): Payer: Self-pay

## 2016-10-04 ENCOUNTER — Emergency Department (HOSPITAL_COMMUNITY): Payer: Medicare Other

## 2016-10-04 DIAGNOSIS — I10 Essential (primary) hypertension: Secondary | ICD-10-CM | POA: Diagnosis not present

## 2016-10-04 DIAGNOSIS — I480 Paroxysmal atrial fibrillation: Secondary | ICD-10-CM | POA: Diagnosis not present

## 2016-10-04 DIAGNOSIS — N390 Urinary tract infection, site not specified: Secondary | ICD-10-CM | POA: Diagnosis not present

## 2016-10-04 DIAGNOSIS — R0789 Other chest pain: Secondary | ICD-10-CM

## 2016-10-04 DIAGNOSIS — R6510 Systemic inflammatory response syndrome (SIRS) of non-infectious origin without acute organ dysfunction: Secondary | ICD-10-CM | POA: Diagnosis present

## 2016-10-04 DIAGNOSIS — Z951 Presence of aortocoronary bypass graft: Secondary | ICD-10-CM

## 2016-10-04 DIAGNOSIS — I2510 Atherosclerotic heart disease of native coronary artery without angina pectoris: Secondary | ICD-10-CM | POA: Diagnosis not present

## 2016-10-04 DIAGNOSIS — R1010 Upper abdominal pain, unspecified: Secondary | ICD-10-CM | POA: Diagnosis not present

## 2016-10-04 DIAGNOSIS — D72829 Elevated white blood cell count, unspecified: Secondary | ICD-10-CM | POA: Diagnosis present

## 2016-10-04 DIAGNOSIS — Z886 Allergy status to analgesic agent status: Secondary | ICD-10-CM | POA: Diagnosis not present

## 2016-10-04 DIAGNOSIS — R109 Unspecified abdominal pain: Secondary | ICD-10-CM | POA: Diagnosis not present

## 2016-10-04 DIAGNOSIS — R079 Chest pain, unspecified: Secondary | ICD-10-CM | POA: Diagnosis present

## 2016-10-04 DIAGNOSIS — D72825 Bandemia: Secondary | ICD-10-CM | POA: Diagnosis not present

## 2016-10-04 DIAGNOSIS — K5792 Diverticulitis of intestine, part unspecified, without perforation or abscess without bleeding: Secondary | ICD-10-CM | POA: Diagnosis present

## 2016-10-04 DIAGNOSIS — R7989 Other specified abnormal findings of blood chemistry: Secondary | ICD-10-CM | POA: Diagnosis not present

## 2016-10-04 DIAGNOSIS — R945 Abnormal results of liver function studies: Secondary | ICD-10-CM

## 2016-10-04 DIAGNOSIS — Z8673 Personal history of transient ischemic attack (TIA), and cerebral infarction without residual deficits: Secondary | ICD-10-CM

## 2016-10-04 DIAGNOSIS — I252 Old myocardial infarction: Secondary | ICD-10-CM

## 2016-10-04 DIAGNOSIS — Z880 Allergy status to penicillin: Secondary | ICD-10-CM

## 2016-10-04 DIAGNOSIS — Z79899 Other long term (current) drug therapy: Secondary | ICD-10-CM

## 2016-10-04 DIAGNOSIS — I4891 Unspecified atrial fibrillation: Secondary | ICD-10-CM | POA: Diagnosis present

## 2016-10-04 LAB — TROPONIN I: Troponin I: <0.03 ng/mL (ref <0.03)

## 2016-10-04 LAB — URINALYSIS, ROUTINE W REFLEX MICROSCOPIC
Bilirubin Urine: NEGATIVE
Glucose, UA: NEGATIVE mg/dL
Hgb urine dipstick: NEGATIVE
Ketones, ur: 5 mg/dL — AB
Nitrite: NEGATIVE
Protein, ur: 100 mg/dL — AB
Specific Gravity, Urine: 1.014 (ref 1.005–1.030)
pH: 5 (ref 5.0–8.0)

## 2016-10-04 LAB — COMPREHENSIVE METABOLIC PANEL
ALT: 226 U/L — ABNORMAL HIGH (ref 14–54)
AST: 454 U/L — ABNORMAL HIGH (ref 15–41)
Albumin: 3.9 g/dL (ref 3.5–5.0)
Alkaline Phosphatase: 114 U/L (ref 38–126)
Anion gap: 9 (ref 5–15)
BUN: 15 mg/dL (ref 6–20)
CO2: 27 mmol/L (ref 22–32)
Calcium: 9.3 mg/dL (ref 8.9–10.3)
Chloride: 99 mmol/L — ABNORMAL LOW (ref 101–111)
Creatinine, Ser: 0.83 mg/dL (ref 0.44–1.00)
GFR calc Af Amer: >60 mL/min (ref >60)
GFR calc non Af Amer: >60 mL/min (ref >60)
Glucose, Bld: 156 mg/dL — ABNORMAL HIGH (ref 65–99)
Potassium: 4.2 mmol/L (ref 3.5–5.1)
Sodium: 135 mmol/L (ref 135–145)
Total Bilirubin: 2.1 mg/dL — ABNORMAL HIGH (ref 0.3–1.2)
Total Protein: 6.9 g/dL (ref 6.5–8.1)

## 2016-10-04 LAB — I-STAT TROPONIN, ED: TROPONIN I, POC: 0.01 ng/mL (ref 0.00–0.08)

## 2016-10-04 LAB — CBC WITH DIFFERENTIAL/PLATELET
Basophils Absolute: 0 10*3/uL (ref 0.0–0.1)
Basophils Relative: 0 %
Eosinophils Absolute: 0.1 10*3/uL (ref 0.0–0.7)
Eosinophils Relative: 0 %
HCT: 39.8 % (ref 36.0–46.0)
Hemoglobin: 13.1 g/dL (ref 12.0–15.0)
Lymphocytes Relative: 4 %
Lymphs Abs: 0.8 10*3/uL (ref 0.7–4.0)
MCH: 30.2 pg (ref 26.0–34.0)
MCHC: 32.9 g/dL (ref 30.0–36.0)
MCV: 91.7 fL (ref 78.0–100.0)
Monocytes Absolute: 0.9 10*3/uL (ref 0.1–1.0)
Monocytes Relative: 5 %
Neutro Abs: 16.3 10*3/uL — ABNORMAL HIGH (ref 1.7–7.7)
Neutrophils Relative %: 91 %
Platelets: 198 10*3/uL (ref 150–400)
RBC: 4.34 MIL/uL (ref 3.87–5.11)
RDW: 13.3 % (ref 11.5–15.5)
WBC: 18.1 10*3/uL — ABNORMAL HIGH (ref 4.0–10.5)

## 2016-10-04 LAB — ACETAMINOPHEN LEVEL: Acetaminophen (Tylenol), Serum: <10 ug/mL — ABNORMAL LOW (ref 10–30)

## 2016-10-04 LAB — PROTIME-INR
INR: 1.14
PROTHROMBIN TIME: 14.6 s (ref 11.4–15.2)

## 2016-10-04 LAB — I-STAT CG4 LACTIC ACID, ED: Lactic Acid, Venous: 1.65 mmol/L (ref 0.5–1.9)

## 2016-10-04 MED ORDER — IOPAMIDOL (ISOVUE-300) INJECTION 61%
INTRAVENOUS | Status: AC
  Administered 2016-10-04: 100 mL
  Filled 2016-10-04: qty 100

## 2016-10-04 NOTE — ED Notes (Signed)
Patient transported to CT 

## 2016-10-04 NOTE — ED Triage Notes (Signed)
Pt. Recently diagnosed with UTI and developed Central chest pain , non-radiating with weakness and nausea and vomiting.  Paramedics reports that pt. Is chest pain free and nausea free.  ECG WNL,  Vitals WNL. Pt. Did not receive ASA due to not being able to take ASA.

## 2016-10-04 NOTE — ED Notes (Signed)
Daughter called [facility]), [phone], unable to make it to hospital, wants an update.

## 2016-10-04 NOTE — ED Notes (Signed)
ED Provider at bedside. 

## 2016-10-04 NOTE — H&P (Addendum)
Marissa Bowen NVB:[id number] DOB: 1926-10-17 DOA: 10/04/2016     PCP: Gildardo Cranker, DO   Outpatient Specialists: [facility] [place] Patient coming from:  home Lives alone,      Chief Complaint: Generalized fatigue mild chest discomfort nausea and vomiting  HPI: Marissa Bowen is a 83 y.o. female with medical history significant of arthritis varicose veins, coronary artery disease, CVA in 2008, GERD, gastric ulcer status post GI bleed in 2011 MI HTN, HLD, paroxysmal atrial fibrillation    Presented with 2-3 days of generalized fatigue associated with occasional nausea and some vomiting. Yesterday she started to have some chest discomfort which lasted only a few minutes. Reports chest pain was substernal lasting only a few minutes. Currently chest pain-free nausea is currently better. Patient was not feeling well and called EMS who brought in to emergency department  Of note recently she has been treated for UTI started on Nitrofurantoin 2 days ago. But reports symptoms of frequent urination for about 1 month.  Reports abdominal pain generalized but felt better after vomiting. Denies diarrhea.  She only has loose BM when she eats fried meat.  She reports taking only 1-2 tylenol pills a day as needed for pain.  NO EtOH use.  Sp cholecystectomy 2 years ago.  Regarding pertinent Chronic problems: History of coronary artery disease status post CABG in 1997 of 3 vessels last [place] in 2018 showed no ischemia Paroxysmal atrial fibrillation on Eliquis  IN ER:  Temp (24hrs), Avg:99 F (37.2 C), Min:98 F (36.7 C), Max:100 F (37.8 C)      on arrival  ED Triage Vitals [10/04/16 1600]  Enc Vitals Group     BP (!) 143/57     Pulse Rate 76     Resp 17     Temp 98 F (36.7 C)     Temp Source Oral     SpO2 98 %     Weight      Height      Head Circumference      Peak Flow      Pain Score      Pain Loc      Pain Edu?      Excl. in [place]?   RR 22 96% HR 73 BP 146/67 LA 1.65 Troponin  0.01  NA 135 K 4.2 Cr 0.83 AST 454 ALT 226 bili total 2.1 WBC 18.1  CXR non acute Following Medications were ordered in ER: Medications  iopamidol (ISOVUE-300) 61 % injection (not administered)       Hospitalist was called for admission for SIRS in the setting of elevated LFTs  Review of Systems:    Pertinent positives include: chills, fatigue, chest pain,  abdominal pain, nausea, vomiting  Constitutional:  No weight loss, night sweats, Fevers,  weight loss  HEENT:  No headaches, Difficulty swallowing,Tooth/dental problems,Sore throat,  No sneezing, itching, ear ache, nasal congestion, post nasal drip,  Cardio-vascular:  No Orthopnea, PND, anasarca, dizziness, palpitations.no Bilateral lower extremity swelling  GI:  No heartburn, indigestion,, diarrhea, change in bowel habits, loss of appetite, melena, blood in stool, hematemesis Resp:  no shortness of breath at rest. No dyspnea on exertion, No excess mucus, no productive cough, No non-productive cough, No coughing up of blood.No change in color of mucus.No wheezing. Skin:  no rash or lesions. No jaundice GU:  no dysuria, change in color of urine, no urgency or frequency. No straining to urinate.  No flank pain.  Musculoskeletal:  No joint pain  or no joint swelling. No decreased range of motion. No back pain.  Psych:  No change in mood or affect. No depression or anxiety. No memory loss.  Neuro: no localizing neurological complaints, no tingling, no weakness, no double vision, no gait abnormality, no slurred speech, no confusion  As per HPI otherwise 10 point review of systems negative.   Past Medical History: Past Medical History:  Diagnosis Date  . Anemia   . Anxiety   . BACK PAIN, LUMBAR 09/27/2009  . CAD (coronary artery disease)    a. s/p remote CABG 1997, 3V.;  b. LexiScan Myoview (8/15): No ischemia, EF 72%, normal study;  c. Echo 12/13 - EF 60-65%, no significant valvular abnormalities, normal RV size and  systolic function.   . CVA (cerebral infarction)    a. Thalamic infarct 09/2009. //  b. hx of TIA in 2008  . Gastric ulcer   . GERD 11/25/2006  . GI bleed    a. 09/2009: secondary to antral ulcer.  . Hiatal hernia   . History of diverticulitis Dx 02/2012  . History of MI (myocardial infarction)   . HYPERLIPIDEMIA   . HYPERTENSION   . IBS 12/07/2008  . MIGRAINE WITH AURA 08/13/2007  . Mild carotid artery disease ([place])    a. 0-39% bilat 05/2011 (f/u recommended 05/2013). //  b. Carotid US 3/17 - Bilat ICA 1-39% >> FU prn  . Muscle weakness (generalized) 09/27/2009  . OSTEOARTHRITIS 08/13/2007  . PAF (paroxysmal atrial fibrillation) ([place]) 11/25/2006   a. Multaq >> changes to Amiodarone 2/2 $$  //  Eliquis (CHADS2-VASc = 6)  . Urinary, incontinence, stress female    wears Pessary   Past Surgical History:  Procedure Laterality Date  . ABDOMINAL HYSTERECTOMY  1973  . CATARACT EXTRACTION W/ INTRAOCULAR LENS  IMPLANT, BILATERAL Bilateral 1999  . CHOLECYSTECTOMY N/A 01/03/2015   Procedure: LAPAROSCOPIC CHOLECYSTECTOMY WITH INTRAOPERATIVE CHOLANGIOGRAM;  Surgeon: Donnie Mesa, MD;  Location: [facility];  Service: General;  Laterality: N/A;  . CORONARY ANGIOPLASTY  1997   Dr Lia Foyer  . CORONARY ARTERY BYPASS GRAFT  1997   Dr Melvenia Needles  . LAPAROSCOPIC CHOLECYSTECTOMY  01/03/2015  . TONSILLECTOMY       Social History:  Ambulatory   Independently     reports that she has never smoked. She has never used smokeless tobacco. She reports that she does not drink alcohol or use drugs.  Allergies:   Allergies  Allergen Reactions  . Asa [Aspirin] Other (See Comments)    Bleeding ulcer  . Crestor [Rosuvastatin Calcium] Other (See Comments)    myalgia  . Hydrocodone Other (See Comments)    REACTION: migraines  . Ibuprofen Other (See Comments)    ulcers  . Oxycodone Hcl Other (See Comments)    REACTION: migraines  . Penicillins Swelling and Rash    Has patient had a PCN reaction causing immediate  rash, facial/tongue/throat swelling, SOB or lightheadedness with hypotension: Yes Has patient had a PCN reaction causing severe rash involving mucus membranes or skin necrosis: No Has patient had a PCN reaction that required hospitalization No Has patient had a PCN reaction occurring within the last 10 years: No If all of the above answers are "NO", then may proceed with Cephalosporin use.   . Prednisone Other (See Comments)    ulcers  . Statins Other (See Comments)    Aches and pains  . Pristiq [Desvenlafaxine] Other (See Comments)    Drowsiness and hangover sensation  Family History:   Family History  Problem Relation Age of Onset  . Heart attack Mother   . Heart failure Father   . Throat cancer Sister   . Heart disease Sister   . Coronary artery disease Sister   . Diabetes Sister   . Heart attack Sister   . Colon cancer Neg Hx     Medications: Prior to Admission medications   Medication Sig Start Date End Date Taking? Authorizing Provider  acetaminophen (TYLENOL) 500 MG tablet Take 500-1,000 mg by mouth every 6 (six) hours as needed for moderate pain.    Yes [provider]  amiodarone (PACERONE) 200 MG tablet Take 100 mg by mouth daily. 07/04/16  Yes [provider]  ELIQUIS 5 MG TABS tablet Take 1 tablet (5 mg total) by mouth 2 (two) times daily. 07/26/16  Yes Josue Hector, MD  LORazepam (ATIVAN) 1 MG tablet TAKE ONE TABLET EVERY EIGHT HOURS AS NEEDED FOR ANXIETY Patient taking differently: TAKE TWO TABLETS (2 MG) BY MOUTH DAILY AT BEDTIME, MAY ALSO TAKE 1 TABLET (1 MG) DURING THE DAY AS NEEDED FOR ANXIETY 08/30/16  Yes Reed, Tiffany L, DO  losartan (COZAAR) 50 MG tablet TAKE ONE TABLET TWICE DAILY 09/11/16  Yes Josue Hector, MD  meclizine (ANTIVERT) 12.5 MG tablet Take one to two tablets by mouth every 8 hours as needed for dizziness Patient taking differently: Take 12.5-25 mg by mouth every 8 (eight) hours as needed for dizziness.  10/03/16  Yes  Gildardo Cranker, DO  Multiple Vitamin (MULTIVITAMIN WITH MINERALS) TABS tablet Take 1 tablet by mouth daily.   Yes [provider]  Multiple Vitamins-Minerals (PRESERVISION AREDS) TABS Take 1 tablet by mouth 2 (two) times daily.    Yes [provider]  niacin 500 MG CR capsule Take 500 mg by mouth 2 (two) times daily.    Yes [provider]  nitrofurantoin (MACRODANTIN) 100 MG capsule Take 100 mg by mouth 2 (two) times daily. 5 day course filled 10/03/16 10/03/16  Yes [provider]  pantoprazole (PROTONIX) 40 MG tablet TAKE ONE TABLET TWICE DAILY 09/17/16  Yes Eulas Post, Monica, DO  sodium chloride (OCEAN) 0.65 % SOLN nasal spray Place 1 spray into both nostrils at bedtime.    Yes [provider]  Tetrahydrozoline HCl (VISINE OP) Place 2 drops into both eyes 2 (two) times daily as needed (for dry eyes).    Yes [provider]  traMADol (ULTRAM) 50 MG tablet TAKE ONE TABLET EVERY EIGHT HOURS AS NEEDED FOR MODERATE PAIN TO SEVERE PAIN 07/04/16  Yes Lauree Chandler, NP  triamcinolone cream (KENALOG) 0.1 % Apply 1 application topically 2 (two) times daily as needed (dry skin).   Yes [provider]  zolpidem (AMBIEN) 10 MG tablet Take 10 mg by mouth at bedtime.   Yes [provider]  DULoxetine (CYMBALTA) 30 MG capsule Take 1 capsule (30 mg total) by mouth daily. Patient not taking: Reported on 10/04/2016 09/13/16   Gildardo Cranker, DO  triamcinolone lotion (KENALOG) 0.1 % Apply 1 application topically daily as needed. Patient not taking: Reported on 10/04/2016 09/13/16   Gildardo Cranker, DO    Physical Exam: Patient Vitals for the past 24 hrs:  BP Temp Temp src Pulse Resp SpO2  10/04/16 2130 (!) 146/67 - - 73 (!) 22 96 %  10/04/16 2100 (!) 142/64 - - 73 19 94 %  10/04/16 1930 134/61 - - 79 12 95 %  10/04/16 1856 (!) 150/70 - -  75 16 95 %  10/04/16 1817 - 100 F (37.8 C) Rectal - - -  10/04/16 1800 (!) 151/57 - - 77 14 99 %  10/04/16  1700 (!) 157/69 - - 76 18 96 %  10/04/16 1601 - - - - - 98 %  10/04/16 1600 (!) 143/57 98 F (36.7 C) Oral 76 17 98 %    1. General:  in No Acute distress 2. Psychological: Alert and    Oriented 3. Head/ENT:     Dry Mucous Membranes                          Head Non traumatic, neck supple                           Poor Dentition 4. SKIN:  decreased Skin turgor,  Skin clean Dry and intact no rash 5. Heart: Regular rate and rhythm no  Murmur, Rub or gallop 6. Lungs:  no wheezes or crackles   7. Abdomen: Soft, RUQ tenderness, Non distended 8. Lower extremities: no clubbing, cyanosis, or edema 9. Neurologically Grossly intact, moving all 4 extremities equally  10. MSK: Normal range of motion   body mass index is unknown because there is no height or weight on file.  Labs on Admission:   Labs on Admission: I have personally reviewed following labs and imaging studies  CBC:  Recent Labs Lab 10/04/16 1707  WBC 18.1*  NEUTROABS 16.3*  HGB 13.1  HCT 39.8  MCV 91.7  PLT 875   Basic Metabolic Panel:  Recent Labs Lab 10/04/16 1707  NA 135  K 4.2  CL 99*  CO2 27  GLUCOSE 156*  BUN 15  CREATININE 0.83  CALCIUM 9.3   GFR: Estimated Creatinine Clearance: 48.2 mL/min (by C-G formula based on SCr of 0.83 mg/dL). Liver Function Tests:  Recent Labs Lab 10/04/16 1707  AST 454*  ALT 226*  ALKPHOS 114  BILITOT 2.1*  PROT 6.9  ALBUMIN 3.9   No results for input(s): LIPASE, AMYLASE in the last 168 hours. No results for input(s): AMMONIA in the last 168 hours. Coagulation Profile: No results for input(s): INR, PROTIME in the last 168 hours. Cardiac Enzymes: No results for input(s): CKTOTAL, CKMB, CKMBINDEX, TROPONINI in the last 168 hours. BNP (last 3 results) No results for input(s): PROBNP in the last 8760 hours. HbA1C: No results for input(s): HGBA1C in the last 72 hours. CBG: No results for input(s): GLUCAP in the last 168 hours. Lipid Profile: No results for  input(s): CHOL, HDL, LDLCALC, TRIG, CHOLHDL, LDLDIRECT in the last 72 hours. Thyroid Function Tests: No results for input(s): TSH, T4TOTAL, FREET4, T3FREE, THYROIDAB in the last 72 hours. Anemia Panel: No results for input(s): VITAMINB12, FOLATE, FERRITIN, TIBC, IRON, RETICCTPCT in the last 72 hours. Urine analysis:    Component Value Date/Time   COLORURINE AMBER (A) 10/04/2016 1825   APPEARANCEUR HAZY (A) 10/04/2016 1825   LABSPEC 1.014 10/04/2016 1825   PHURINE 5.0 10/04/2016 1825   GLUCOSEU NEGATIVE 10/04/2016 1825   HGBUR NEGATIVE 10/04/2016 1825   BILIRUBINUR NEGATIVE 10/04/2016 1825   BILIRUBINUR 2+ 11/09/2014 1429   KETONESUR 5 (A) 10/04/2016 1825   PROTEINUR 100 (A) 10/04/2016 1825   UROBILINOGEN negative 11/09/2014 1429   UROBILINOGEN 0.2 01/12/2013 1618   NITRITE NEGATIVE 10/04/2016 1825   LEUKOCYTESUR TRACE (A) 10/04/2016 1825   Sepsis Labs: @LABRCNTIP (procalcitonin:4,lacticidven:4) )No results found for this or  any previous visit (from the past 240 hour(s)).    UA   no evidence of UTI    Lab Results  Component Value Date   HGBA1C (H) 09/29/2009    5.8 (NOTE)                                                                       According to the ADA Clinical Practice Recommendations for 2011, when HbA1c is used as a screening test:   >=6.5%   Diagnostic of Diabetes Mellitus           (if abnormal result  is confirmed)  5.7-6.4%   Increased risk of developing Diabetes Mellitus  References:Diagnosis and Classification of Diabetes Mellitus,Diabetes WNIO,2703,50(KXFGH 1):S62-S69 and Standards of Medical Care in         Diabetes - 2011,Diabetes WEXH,3716,96  (Suppl 1):S11-S61.    Estimated Creatinine Clearance: 48.2 mL/min (by C-G formula based on SCr of 0.83 mg/dL).  BNP (last 3 results) No results for input(s): PROBNP in the last 8760 hours.   ECG REPORT  Independently reviewed Rate: 75  Rhythm: NSR ST&T Change:Early repolarization QTC 484  There were no  vitals filed for this visit.   Cultures:    Component Value Date/Time   SDES STOOL 06/08/2012 1202   SPECREQUEST NONE 06/08/2012 1202   CULT  06/08/2012 1202    NO SALMONELLA, SHIGELLA, CAMPYLOBACTER, YERSINIA, OR E.COLI 0157:H7 ISOLATED   REPTSTATUS 06/12/2012 FINAL 06/08/2012 1202     Radiological Exams on Admission: Dg Chest 2 View  Result Date: 10/04/2016 CLINICAL DATA:  Chest pain and weakness. EXAM: CHEST  2 VIEW COMPARISON:  11/05/2014 FINDINGS: Previous median sternotomy and CABG procedure. Aortic atherosclerosis noted. No pleural effusion or edema. No airspace opacities. Spondylosis noted within the thoracic spine. IMPRESSION: 1. No acute cardiopulmonary abnormalities. 2.  Aortic Atherosclerosis (ICD10-I70.0). Electronically Signed   By: Kerby Moors M.D.   On: 10/04/2016 16:42   Ct Abdomen Pelvis W Contrast  Result Date: 10/04/2016 CLINICAL DATA:  83 year old with abdominal pain, nausea and vomiting for 3 days. EXAM: CT ABDOMEN AND PELVIS WITH CONTRAST TECHNIQUE: Multidetector CT imaging of the abdomen and pelvis was performed using the standard protocol following bolus administration of intravenous contrast. CONTRAST:  147mL ISOVUE-300 IOPAMIDOL (ISOVUE-300) INJECTION 61% COMPARISON:  CTA 06/08/2012 FINDINGS: Lower chest: Dependent atelectasis in both lower lobes. No pleural fluid. Atherosclerosis of the descending thoracic aorta. Hepatobiliary: No focal liver abnormality is seen. Status post cholecystectomy. No biliary dilatation. Pancreas: Unremarkable. No pancreatic ductal dilatation or surrounding inflammatory changes. Spleen: Normal in size without focal abnormality. Adrenals/Urinary Tract: No adrenal nodule. No hydronephrosis or perinephric edema. Partially duplicated right renal collecting system. 2.5 cm simple cyst in the posterior mid left kidney. Urinary bladder is physiologically distended. Stomach/Bowel: Multifocal colonic diverticulosis, prominent in the descending and  sigmoid colon. Scattered areas of mural wall thickening in the mid sigmoid without pericolonic edema. Normal appendix. Small bowel is decompressed. Stomach is physiologically distended. Vascular/Lymphatic: Dense aortic atherosclerosis. No aneurysm. Circumaortic left renal vein. No acute vascular abnormality. No adenopathy. Reproductive: Status post hysterectomy. No adnexal masses. Other: No free air, free fluid, or intra-abdominal fluid collection. Tiny fat containing umbilical hernia. Musculoskeletal: Degenerative change in the lumbar spine and both hips.  There are no acute or suspicious osseous abnormalities. IMPRESSION: 1. Colonic diverticulosis. Mild mural wall thickening in the mid sigmoid, likely chronic, cannot exclude mild acute diverticulitis, particularly if there is lower abdominal pain. 2. No additional acute abnormality. 3. Aortic atherosclerosis without aneurysm. Electronically Signed   By: Jeb Levering M.D.   On: 10/04/2016 23:11    Chart has been reviewed    Assessment/Plan   83 y.o. female with medical history significant of arthritis varicose veins, coronary artery disease, CVA in 2008, GERD, gastric ulcer status post GI bleed in 2011 MI HTN, HLD, paroxysmal atrial fibrillation   Admitted for SIRS and elevated LFT's  Present on Admission:  . Elevated LFTs - Obtain CT abdomen, pt is SP cholecystectomy. Obtain LDH and hepatitis serologies check lipase. If persists consider right upper quadrant ultrasound . SIRS (systemic inflammatory response syndrome) (HCC) -  CT of abdomen on initial possibility of mild diverticulitis given some abdominal pain now cover with CIPRO/Flagyl (penicillin allergy)  await results of further testing. Patient was recently treated for UTI that should be covered with cipro UA currently does not appear to show signs of infection . Atrial fibrillation ([place]) -         - CHA2DS2 vas score 6 : continue current anticoagulation with Eliquis           -   Remained sinus rhythm rate is controlled        - Rhythm control: Continue amiodarone  . CAD (coronary artery disease) - stable continue home medications given chest pain cycle cardiac enzymes . Chest pain - advancing age and history of coronary artery disease is cycle cardiac enzymes obtain echogram monitor on telemetry obtain serial EKG shouldn't does not tolerate aspirin . Essential hypertension continue home medications  Other plan as per orders.  DVT prophylaxis:  SCD   Code Status:  FULL CODE  as per patient    Family Communication:   Family not at  Bedside     Disposition Plan:    To home once workup is complete and patient is stable                  Consults called: none    Admission status:  obs   Level of care    tele           I have spent a total of 56 min on this admission   Vencil Basnett 10/04/2016, 12:36 AM    [facility]  Pager [phone]   after 2 AM please page floor coverage PA If 7AM-7PM, please contact the day team taking care of the patient  [email or web address]  Password [email or web address]

## 2016-10-04 NOTE — ED Notes (Signed)
Patient transported to X-ray 

## 2016-10-04 NOTE — Progress Notes (Signed)
New Admission Note:   Arrival Method: Bed Mental Orientation: Alert and oriented x4 Telemetry:3ebox 06 Assessment: Completed Pain:0/10 Tubes: None Safety Measures: Safety Fall Prevention Plan has been discussed  Admission:  [facility] Orientation: Patient has been orientated to the room, unit and staff.  Family: none at bedside  Orders to be reviewed and implemented. Will continue to monitor the patient. Call light has been placed within reach and bed alarm has been activated.   Venetia Night, RN Phone: [phone]

## 2016-10-05 ENCOUNTER — Observation Stay (HOSPITAL_BASED_OUTPATIENT_CLINIC_OR_DEPARTMENT_OTHER): Payer: Medicare Other

## 2016-10-05 DIAGNOSIS — R6510 Systemic inflammatory response syndrome (SIRS) of non-infectious origin without acute organ dysfunction: Secondary | ICD-10-CM | POA: Diagnosis not present

## 2016-10-05 DIAGNOSIS — Z886 Allergy status to analgesic agent status: Secondary | ICD-10-CM | POA: Diagnosis not present

## 2016-10-05 DIAGNOSIS — I2510 Atherosclerotic heart disease of native coronary artery without angina pectoris: Secondary | ICD-10-CM

## 2016-10-05 DIAGNOSIS — R079 Chest pain, unspecified: Secondary | ICD-10-CM

## 2016-10-05 DIAGNOSIS — I10 Essential (primary) hypertension: Secondary | ICD-10-CM | POA: Diagnosis not present

## 2016-10-05 DIAGNOSIS — R7989 Other specified abnormal findings of blood chemistry: Secondary | ICD-10-CM

## 2016-10-05 DIAGNOSIS — K5792 Diverticulitis of intestine, part unspecified, without perforation or abscess without bleeding: Secondary | ICD-10-CM | POA: Diagnosis not present

## 2016-10-05 DIAGNOSIS — R0789 Other chest pain: Secondary | ICD-10-CM | POA: Diagnosis not present

## 2016-10-05 DIAGNOSIS — I480 Paroxysmal atrial fibrillation: Secondary | ICD-10-CM | POA: Diagnosis not present

## 2016-10-05 DIAGNOSIS — I252 Old myocardial infarction: Secondary | ICD-10-CM | POA: Diagnosis not present

## 2016-10-05 DIAGNOSIS — R1010 Upper abdominal pain, unspecified: Secondary | ICD-10-CM | POA: Diagnosis not present

## 2016-10-05 DIAGNOSIS — Z8673 Personal history of transient ischemic attack (TIA), and cerebral infarction without residual deficits: Secondary | ICD-10-CM | POA: Diagnosis not present

## 2016-10-05 DIAGNOSIS — D72825 Bandemia: Secondary | ICD-10-CM

## 2016-10-05 DIAGNOSIS — N390 Urinary tract infection, site not specified: Secondary | ICD-10-CM | POA: Diagnosis not present

## 2016-10-05 DIAGNOSIS — I360 Nonrheumatic tricuspid (valve) stenosis: Secondary | ICD-10-CM

## 2016-10-05 DIAGNOSIS — Z880 Allergy status to penicillin: Secondary | ICD-10-CM | POA: Diagnosis not present

## 2016-10-05 DIAGNOSIS — Z79899 Other long term (current) drug therapy: Secondary | ICD-10-CM | POA: Diagnosis not present

## 2016-10-05 DIAGNOSIS — Z951 Presence of aortocoronary bypass graft: Secondary | ICD-10-CM | POA: Diagnosis not present

## 2016-10-05 LAB — MAGNESIUM: Magnesium: 2 mg/dL (ref 1.7–2.4)

## 2016-10-05 LAB — COMPREHENSIVE METABOLIC PANEL
ALK PHOS: 151 U/L — AB (ref 38–126)
ALT: 526 U/L — AB (ref 14–54)
AST: 625 U/L — ABNORMAL HIGH (ref 15–41)
Albumin: 3.5 g/dL (ref 3.5–5.0)
Anion gap: 9 (ref 5–15)
BILIRUBIN TOTAL: 2.2 mg/dL — AB (ref 0.3–1.2)
BUN: 11 mg/dL (ref 6–20)
CALCIUM: 9.2 mg/dL (ref 8.9–10.3)
CO2: 26 mmol/L (ref 22–32)
CREATININE: 0.67 mg/dL (ref 0.44–1.00)
Chloride: 98 mmol/L — ABNORMAL LOW (ref 101–111)
Glucose, Bld: 163 mg/dL — ABNORMAL HIGH (ref 65–99)
Potassium: 3.7 mmol/L (ref 3.5–5.1)
Sodium: 133 mmol/L — ABNORMAL LOW (ref 135–145)
TOTAL PROTEIN: 6.3 g/dL — AB (ref 6.5–8.1)

## 2016-10-05 LAB — ECHOCARDIOGRAM COMPLETE
HEIGHTINCHES: 68 in
WEIGHTICAEL: 2393.6 [oz_av]

## 2016-10-05 LAB — CBC WITH DIFFERENTIAL/PLATELET
BASOS PCT: 0 %
Basophils Absolute: 0 10*3/uL (ref 0.0–0.1)
EOS ABS: 0.2 10*3/uL (ref 0.0–0.7)
Eosinophils Relative: 1 %
HCT: 37.6 % (ref 36.0–46.0)
Hemoglobin: 12.4 g/dL (ref 12.0–15.0)
Lymphocytes Relative: 5 %
Lymphs Abs: 0.7 10*3/uL (ref 0.7–4.0)
MCH: 30 pg (ref 26.0–34.0)
MCHC: 33 g/dL (ref 30.0–36.0)
MCV: 91 fL (ref 78.0–100.0)
MONO ABS: 0.9 10*3/uL (ref 0.1–1.0)
MONOS PCT: 7 %
Neutro Abs: 12.3 10*3/uL — ABNORMAL HIGH (ref 1.7–7.7)
Neutrophils Relative %: 87 %
Platelets: 213 10*3/uL (ref 150–400)
RBC: 4.13 MIL/uL (ref 3.87–5.11)
RDW: 13.3 % (ref 11.5–15.5)
WBC: 14.1 10*3/uL — ABNORMAL HIGH (ref 4.0–10.5)

## 2016-10-05 LAB — LIPASE, BLOOD: LIPASE: 28 U/L (ref 11–51)

## 2016-10-05 LAB — TROPONIN I: Troponin I: <0.03 ng/mL (ref <0.03)

## 2016-10-05 LAB — PHOSPHORUS: Phosphorus: 3.3 mg/dL (ref 2.5–4.6)

## 2016-10-05 LAB — TSH: TSH: 0.807 u[IU]/mL (ref 0.350–4.500)

## 2016-10-05 LAB — LACTATE DEHYDROGENASE: LDH: 528 U/L — AB (ref 98–192)

## 2016-10-05 MED ORDER — ONDANSETRON HCL 4 MG PO TABS: 4.0000 mg | ORAL_TABLET | Freq: Four times a day (QID) | ORAL | Status: DC | PRN

## 2016-10-05 MED ORDER — SODIUM CHLORIDE 0.9 % IV SOLN
INTRAVENOUS | Status: AC
  Administered 2016-10-05: 01:00:00 via INTRAVENOUS

## 2016-10-05 MED ORDER — AMIODARONE HCL 100 MG PO TABS
100.0000 mg | ORAL_TABLET | Freq: Every day | ORAL | Status: DC
  Administered 2016-10-05 – 2016-10-07 (×3): 100 mg via ORAL
  Filled 2016-10-05 (×3): qty 1

## 2016-10-05 MED ORDER — TRAMADOL HCL 50 MG PO TABS
50.0000 mg | ORAL_TABLET | Freq: Four times a day (QID) | ORAL | Status: DC | PRN
  Administered 2016-10-06: 50 mg via ORAL
  Filled 2016-10-05: qty 1

## 2016-10-05 MED ORDER — SODIUM CHLORIDE 0.9% FLUSH
3.0000 mL | Freq: Two times a day (BID) | INTRAVENOUS | Status: DC
  Administered 2016-10-05 – 2016-10-07 (×6): 3 mL via INTRAVENOUS

## 2016-10-05 MED ORDER — ONDANSETRON HCL 4 MG/2ML IJ SOLN: 4.0000 mg | Freq: Four times a day (QID) | INTRAMUSCULAR | Status: DC | PRN

## 2016-10-05 MED ORDER — LORAZEPAM 1 MG PO TABS
2.0000 mg | ORAL_TABLET | Freq: Every day | ORAL | Status: DC
  Administered 2016-10-05 – 2016-10-06 (×3): 2 mg via ORAL
  Filled 2016-10-05 (×3): qty 2

## 2016-10-05 MED ORDER — APIXABAN 5 MG PO TABS
5.0000 mg | ORAL_TABLET | Freq: Two times a day (BID) | ORAL | Status: DC
  Administered 2016-10-05 – 2016-10-07 (×6): 5 mg via ORAL
  Filled 2016-10-05 (×6): qty 1

## 2016-10-05 MED ORDER — CIPROFLOXACIN IN D5W 400 MG/200ML IV SOLN
400.0000 mg | Freq: Two times a day (BID) | INTRAVENOUS | Status: DC
  Administered 2016-10-05 – 2016-10-07 (×6): 400 mg via INTRAVENOUS
  Filled 2016-10-05 (×6): qty 200

## 2016-10-05 MED ORDER — METRONIDAZOLE IN NACL 5-0.79 MG/ML-% IV SOLN
500.0000 mg | Freq: Three times a day (TID) | INTRAVENOUS | Status: DC
  Administered 2016-10-05 – 2016-10-06 (×5): 500 mg via INTRAVENOUS
  Filled 2016-10-05 (×5): qty 100

## 2016-10-05 MED ORDER — PANTOPRAZOLE SODIUM 40 MG PO TBEC
40.0000 mg | DELAYED_RELEASE_TABLET | Freq: Two times a day (BID) | ORAL | Status: DC
  Administered 2016-10-05 – 2016-10-07 (×6): 40 mg via ORAL
  Filled 2016-10-05 (×6): qty 1

## 2016-10-05 MED ORDER — ZOLPIDEM TARTRATE 5 MG PO TABS
5.0000 mg | ORAL_TABLET | Freq: Every day | ORAL | Status: DC
  Administered 2016-10-05 – 2016-10-06 (×3): 5 mg via ORAL
  Filled 2016-10-05 (×3): qty 1

## 2016-10-05 NOTE — Evaluation (Signed)
Physical Therapy Evaluation Patient Details Name: Marissa Bowen MRN: [id number] DOB: [birth year] Today's Date: 10/05/2016   History of Present Illness  Patient is an 83 yo female admitted 10/04/16 with 2-3 days of generalized fatigue associated with occasional nausea and some vomiting.   Patient with SIRS, UTI.      PMH:  CAD, CVA, MI, HTN, HLD, PAF  Clinical Impression  Patient presents with decreased strength and balance, impacting gait/safety.  Patient will benefit from acute PT to maximize functional independence prior to discharge.  Recommend HHPT for continued therapy.    Follow Up Recommendations Home health PT;Supervision - Intermittent    Equipment Recommendations  None recommended by PT    Recommendations for Other Services       Precautions / Restrictions Precautions Precautions: Fall Precaution Comments: Has had a few falls at home.  Talked about limiting what she carries when stepping up into house. Restrictions Weight Bearing Restrictions: No      Mobility  Bed Mobility Overal bed mobility: Modified Independent             General bed mobility comments: Use of bed rail and increased time.  Transfers Overall transfer level: Modified independent Equipment used: None                Ambulation/Gait Ambulation/Gait assistance: Min guard Ambulation Distance (Feet): 68 Feet Assistive device: None Gait Pattern/deviations: Step-through pattern;Decreased stride length;Shuffle;Drifts right/left Gait velocity: decreased Gait velocity interpretation: Below normal speed for age/gender General Gait Details: Patient with slow, slightly unsteady gait.  Initially required additional assist due to decreased balance.  Improved with time ambulating.  Stairs            Wheelchair Mobility    Modified Rankin (Stroke Patients Only)       Balance Overall balance assessment: Needs assistance;History of Falls Sitting-balance support: No upper extremity  supported;Feet supported Sitting balance-Leahy Scale: Good     Standing balance support: No upper extremity supported;During functional activity Standing balance-Leahy Scale: Fair                               Pertinent Vitals/Pain Pain Assessment: No/denies pain    Home Living Family/patient expects to be discharged to:: Private residence Living Arrangements: Alone Available Help at Discharge: Family;Available PRN/intermittently (Daughter lives nearby and helps out) Type of Home: House Home Access: Stairs to enter Entrance Stairs-Rails: None Entrance Stairs-Number of Steps: 1 Home Layout: One level Home Equipment: Environmental consultant - 2 wheels;Cane - single point;Shower seat      Prior Function Level of Independence: Independent         Comments: Drives for errands near home.     Hand Dominance        Extremity/Trunk Assessment   Upper Extremity Assessment Upper Extremity Assessment: Defer to OT evaluation    Lower Extremity Assessment Lower Extremity Assessment: Generalized weakness       Communication   Communication: No difficulties  Cognition Arousal/Alertness: Awake/alert Behavior During Therapy: WFL for tasks assessed/performed Overall Cognitive Status: Within Functional Limits for tasks assessed                                        General Comments      Exercises     Assessment/Plan    PT Assessment Patient needs continued PT services  PT Problem List  Decreased strength;Decreased balance;Decreased mobility;Decreased knowledge of use of DME       PT Treatment Interventions DME instruction;Gait training;Functional mobility training;Therapeutic activities;Therapeutic exercise;Balance training;Patient/family education    PT Goals (Current goals can be found in the Care Plan section)  Acute Rehab PT Goals Patient Stated Goal: To go home soon PT Goal Formulation: With patient Time For Goal Achievement: 10/12/16 Potential  to Achieve Goals: Good    Frequency Min 3X/week   Barriers to discharge Decreased caregiver support Lives alone.  PRN assist.    Co-evaluation               AM-PAC PT "6 Clicks" Daily Activity  Outcome Measure Difficulty turning over in bed (including adjusting bedclothes, sheets and blankets)?: None Difficulty moving from lying on back to sitting on the side of the bed? : None Difficulty sitting down on and standing up from a chair with arms (e.g., wheelchair, bedside commode, etc,.)?: None Help needed moving to and from a bed to chair (including a wheelchair)?: A Little Help needed walking in hospital room?: A Little Help needed climbing 3-5 steps with a railing? : A Little 6 Click Score: 21    End of Session Equipment Utilized During Treatment: Gait belt Activity Tolerance: Patient tolerated treatment well Patient left: in bed;with call bell/phone within reach (sitting EOB for dinner) Nurse Communication: Mobility status (sitting EOB) PT Visit Diagnosis: Unsteadiness on feet (R26.81);Other abnormalities of gait and mobility (R26.89);Muscle weakness (generalized) (M62.81);History of falling (Z91.81)    Time: 8088-1103 PT Time Calculation (min) (ACUTE ONLY): 20 min   Charges:   PT Evaluation $PT Eval Moderate Complexity: 1 Procedure     PT G Codes:        Carita Pian. Sanjuana Kava, [facility] Acute Rehab Services Pager [place] 10/05/2016, 9:25 PM

## 2016-10-05 NOTE — Progress Notes (Signed)
Echocardiogram 2D Echocardiogram has been performed.  Marissa Bowen 10/05/2016, 2:51 PM

## 2016-10-05 NOTE — Progress Notes (Signed)
PROGRESS NOTE  Marissa Bowen  QQP:[id number] DOB: 1926-06-17 DOA: 10/04/2016 PCP: Gildardo Cranker, DO Outpatient Specialists:  Subjective: Patient feels weak, still very anorexic, very poor appetite.  Brief Narrative:  Presented with 2-3 days of generalized fatigue associated with occasional nausea and some vomiting. Yesterday she started to have some chest discomfort which lasted only a few minutes. Reports chest pain was substernal lasting only a few minutes. Currently chest pain-free nausea is currently better. She reported urinary frequency and sense of incomplete evacuation, seen by her PCP 2 days ago prescribed nitrofurantoin, but she still have the same symptoms, UA still showing pus cells, will obtain urine culture and start antibiotics. Has significant transaminitis  Assessment & Plan:   Active Problems:   CAD (coronary artery disease)   Leukocytosis   Atrial fibrillation (HCC)   Essential hypertension   Chest pain   Elevated LFTs   SIRS (systemic inflammatory response syndrome) (HCC)   Diverticulitis   UTI -The recently treated for UTI, still have same symptoms, urinalysis showed 3-6 pus cells. -She is on Cipro and Flagyl, will continue.  Transaminitis -AST 625 and ALT 526, total bilirubin is 2.2. -Patient has history of cholecystectomy, no history of viral hepatitis. -CT scan without evidence of obstructive jaundice. -Cannot rule out liver injury secondary to nitrofurantoin, hold medications, continue fluid hydration.  ? Diverticulitis -CT scan showed diverticulosis with mild thickening at the level of mid sigmoid colon. -Per report this is likely chronic, patient denies diarrhea, denies lower abdominal pain. -Currently on Cipro/Metro, I will continue that likely to discontinue Flagyl in a day or 2 if no symptoms developed.  Generalized weakness -This is likely secondary to dehydration and UTI. -We'll hydrate with IV fluids, treat UTI, ambulate as  tolerated.  Paroxysmal A. fib -CHA2DS2-VASc score of 6, she is on Eliquis. -Remained with rate controlled with amiodarone, currently normal sinus rhythm.  CAD -Stable, continue on medications, concurrent chest pain likely of gastrointestinal origin. -3 sets of troponin is negative for cardiac enzymes.   DVT prophylaxis:  Code Status: Full Code Family Communication:  Disposition Plan:  Diet: Diet Heart Room service appropriate? Yes; Fluid consistency: Thin  Consultants:   None  Procedures:   None  Antimicrobials:   Cipro/Metro  Objective: Vitals:   10/04/16 2200 10/04/16 2230 10/04/16 2329 10/05/16 0652  BP: (!) 146/68 (!) 142/62 (!) 154/60   Pulse: 75 76 76   Resp: (!) 23 18 18    Temp:   98.6 F (37 C) 98.7 F (37.1 C)  TempSrc:   Oral   SpO2: 96% 96% 97% 94%  Weight:   67.9 kg (149 lb 9.6 oz)   Height:   5\' 8"  (1.727 m)     Intake/Output Summary (Last 24 hours) at 10/05/16 1124 Last data filed at 10/05/16 0851  Gross per 24 hour  Intake           833.34 ml  Output             1450 ml  Net          -616.66 ml   Filed Weights   10/04/16 2329  Weight: 67.9 kg (149 lb 9.6 oz)    Examination: General exam: Appears calm and comfortable  Respiratory system: Clear to auscultation. Respiratory effort normal. Cardiovascular system: S1 & S2 heard, RRR. No JVD, murmurs, rubs, gallops or clicks. No pedal edema. Gastrointestinal system: Abdomen is nondistended, soft and nontender. No organomegaly or masses felt. Normal bowel sounds heard. Central nervous  system: Alert and oriented. No focal neurological deficits. Extremities: Symmetric 5 x 5 power. Skin: No rashes, lesions or ulcers Psychiatry: Judgement and insight appear normal. Mood & affect appropriate.   Data Reviewed: I have personally reviewed following labs and imaging studies  CBC:  Recent Labs Lab 10/04/16 1707 10/05/16 0226  WBC 18.1* 14.1*  NEUTROABS 16.3* 12.3*  HGB 13.1 12.4  HCT 39.8 37.6   MCV 91.7 91.0  PLT 198 606   Basic Metabolic Panel:  Recent Labs Lab 10/04/16 1707 10/05/16 0226  NA 135 133*  K 4.2 3.7  CL 99* 98*  CO2 27 26  GLUCOSE 156* 163*  BUN 15 11  CREATININE 0.83 0.67  CALCIUM 9.3 9.2  MG  --  2.0  PHOS  --  3.3   GFR: Estimated Creatinine Clearance: 50 mL/min (by C-G formula based on SCr of 0.67 mg/dL). Liver Function Tests:  Recent Labs Lab 10/04/16 1707 10/05/16 0226  AST 454* 625*  ALT 226* 526*  ALKPHOS 114 151*  BILITOT 2.1* 2.2*  PROT 6.9 6.3*  ALBUMIN 3.9 3.5    Recent Labs Lab 10/05/16 0226  LIPASE 28   No results for input(s): AMMONIA in the last 168 hours. Coagulation Profile:  Recent Labs Lab 10/04/16 2314  INR 1.14   Cardiac Enzymes:  Recent Labs Lab 10/04/16 2233 10/04/16 2315 10/05/16 0958  TROPONINI <0.03 <0.03 <0.03   BNP (last 3 results) No results for input(s): PROBNP in the last 8760 hours. HbA1C: No results for input(s): HGBA1C in the last 72 hours. CBG: No results for input(s): GLUCAP in the last 168 hours. Lipid Profile: No results for input(s): CHOL, HDL, LDLCALC, TRIG, CHOLHDL, LDLDIRECT in the last 72 hours. Thyroid Function Tests:  Recent Labs  10/05/16 0226  TSH 0.807   Anemia Panel: No results for input(s): VITAMINB12, FOLATE, FERRITIN, TIBC, IRON, RETICCTPCT in the last 72 hours. Urine analysis:    Component Value Date/Time   COLORURINE AMBER (A) 10/04/2016 1825   APPEARANCEUR HAZY (A) 10/04/2016 1825   LABSPEC 1.014 10/04/2016 1825   PHURINE 5.0 10/04/2016 1825   GLUCOSEU NEGATIVE 10/04/2016 1825   HGBUR NEGATIVE 10/04/2016 1825   BILIRUBINUR NEGATIVE 10/04/2016 1825   BILIRUBINUR 2+ 11/09/2014 1429   KETONESUR 5 (A) 10/04/2016 1825   PROTEINUR 100 (A) 10/04/2016 1825   UROBILINOGEN negative 11/09/2014 1429   UROBILINOGEN 0.2 01/12/2013 1618   NITRITE NEGATIVE 10/04/2016 1825   LEUKOCYTESUR TRACE (A) 10/04/2016 1825   Sepsis  Labs: @LABRCNTIP (procalcitonin:4,lacticidven:4)  )No results found for this or any previous visit (from the past 240 hour(s)).   Invalid input(s): PROCALCITONIN, LACTICACIDVEN   Radiology Studies: Dg Chest 2 View  Result Date: 10/04/2016 CLINICAL DATA:  Chest pain and weakness. EXAM: CHEST  2 VIEW COMPARISON:  11/05/2014 FINDINGS: Previous median sternotomy and CABG procedure. Aortic atherosclerosis noted. No pleural effusion or edema. No airspace opacities. Spondylosis noted within the thoracic spine. IMPRESSION: 1. No acute cardiopulmonary abnormalities. 2.  Aortic Atherosclerosis (ICD10-I70.0). Electronically Signed   By: Kerby Moors M.D.   On: 10/04/2016 16:42   Ct Abdomen Pelvis W Contrast  Result Date: 10/04/2016 CLINICAL DATA:  83 year old with abdominal pain, nausea and vomiting for 3 days. EXAM: CT ABDOMEN AND PELVIS WITH CONTRAST TECHNIQUE: Multidetector CT imaging of the abdomen and pelvis was performed using the standard protocol following bolus administration of intravenous contrast. CONTRAST:  173mL ISOVUE-300 IOPAMIDOL (ISOVUE-300) INJECTION 61% COMPARISON:  CTA 06/08/2012 FINDINGS: Lower chest: Dependent atelectasis in both lower lobes. No  pleural fluid. Atherosclerosis of the descending thoracic aorta. Hepatobiliary: No focal liver abnormality is seen. Status post cholecystectomy. No biliary dilatation. Pancreas: Unremarkable. No pancreatic ductal dilatation or surrounding inflammatory changes. Spleen: Normal in size without focal abnormality. Adrenals/Urinary Tract: No adrenal nodule. No hydronephrosis or perinephric edema. Partially duplicated right renal collecting system. 2.5 cm simple cyst in the posterior mid left kidney. Urinary bladder is physiologically distended. Stomach/Bowel: Multifocal colonic diverticulosis, prominent in the descending and sigmoid colon. Scattered areas of mural wall thickening in the mid sigmoid without pericolonic edema. Normal appendix. Small bowel  is decompressed. Stomach is physiologically distended. Vascular/Lymphatic: Dense aortic atherosclerosis. No aneurysm. Circumaortic left renal vein. No acute vascular abnormality. No adenopathy. Reproductive: Status post hysterectomy. No adnexal masses. Other: No free air, free fluid, or intra-abdominal fluid collection. Tiny fat containing umbilical hernia. Musculoskeletal: Degenerative change in the lumbar spine and both hips. There are no acute or suspicious osseous abnormalities. IMPRESSION: 1. Colonic diverticulosis. Mild mural wall thickening in the mid sigmoid, likely chronic, cannot exclude mild acute diverticulitis, particularly if there is lower abdominal pain. 2. No additional acute abnormality. 3. Aortic atherosclerosis without aneurysm. Electronically Signed   By: Jeb Levering M.D.   On: 10/04/2016 23:11        Scheduled Meds: . amiodarone  100 mg Oral Daily  . apixaban  5 mg Oral BID  . LORazepam  2 mg Oral QHS  . pantoprazole  40 mg Oral BID  . sodium chloride flush  3 mL Intravenous Q12H  . zolpidem  5 mg Oral QHS   Continuous Infusions: . ciprofloxacin Stopped (10/05/16 0214)  . metronidazole 500 mg (10/05/16 1017)     LOS: 0 days    Time spent: 35 minutes    Lydon Vansickle A, MD [facility] Pager [phone]  If 7PM-7AM, please contact night-coverage [email or web address] Password [email or web address] 10/05/2016, 11:24 AM

## 2016-10-05 NOTE — Progress Notes (Signed)
Pt is alert and oriented with no acute changes, Vitals stable, ECHO and URINE cultures pending. Hard stick Iv antibiotics continue.

## 2016-10-06 LAB — COMPREHENSIVE METABOLIC PANEL
ALK PHOS: 141 U/L — AB (ref 38–126)
ALT: 306 U/L — ABNORMAL HIGH (ref 14–54)
ANION GAP: 9 (ref 5–15)
AST: 162 U/L — ABNORMAL HIGH (ref 15–41)
Albumin: 3.6 g/dL (ref 3.5–5.0)
BILIRUBIN TOTAL: 0.9 mg/dL (ref 0.3–1.2)
BUN: 11 mg/dL (ref 6–20)
CALCIUM: 9.1 mg/dL (ref 8.9–10.3)
CO2: 25 mmol/L (ref 22–32)
Chloride: 103 mmol/L (ref 101–111)
Creatinine, Ser: 0.74 mg/dL (ref 0.44–1.00)
GFR calc non Af Amer: >60 mL/min (ref >60)
GLUCOSE: 185 mg/dL — AB (ref 65–99)
POTASSIUM: 3.6 mmol/L (ref 3.5–5.1)
SODIUM: 137 mmol/L (ref 135–145)
TOTAL PROTEIN: 6.7 g/dL (ref 6.5–8.1)

## 2016-10-06 LAB — URINE CULTURE

## 2016-10-06 LAB — HEMOGLOBIN A1C
HEMOGLOBIN A1C: 5.5 % (ref 4.8–5.6)
Mean Plasma Glucose: 111 mg/dL

## 2016-10-06 MED ORDER — POTASSIUM CHLORIDE IN NACL 20-0.9 MEQ/L-% IV SOLN
INTRAVENOUS | Status: AC
  Administered 2016-10-06 – 2016-10-07 (×2): via INTRAVENOUS
  Filled 2016-10-06: qty 1000

## 2016-10-06 MED ORDER — SODIUM CHLORIDE 0.9 % IV SOLN
INTRAVENOUS | Status: DC
  Administered 2016-10-06: 12:00:00 via INTRAVENOUS
  Filled 2016-10-06 (×2): qty 1000

## 2016-10-06 NOTE — Progress Notes (Signed)
Occupational Therapy Treatment Patient Details Name: Marissa Bowen MRN: [id number] DOB: 06/24/1926 Today's Date: 10/06/2016    History of present illness Patient is an 83 yo female admitted 10/04/16 with 2-3 days of generalized fatigue associated with occasional nausea and some vomiting.   Patient with SIRS, UTI.      PMH:  CAD, CVA, MI, HTN, HLD, PAF   OT comments  PTA, pt was living alone and was independent. Currently, pt requires supervision for ADLs and functional mobility. Provided education on fall prevention and reviewed handout; pt verbalized understanding. Answered all pt questions. Recommend dc home once medically stable per physician. All acute OT needs met and will sign off. Thank you.   Follow Up Recommendations  No OT follow up;Supervision - Intermittent    Equipment Recommendations  None recommended by OT    Recommendations for Other Services PT consult    Precautions / Restrictions Precautions Precautions: Fall Precaution Comments: Has had a few falls at home.  Provided fall prevention handout Restrictions Weight Bearing Restrictions: No       Mobility Bed Mobility               General bed mobility comments: Pt sitting at EOB upon arrival  Transfers Overall transfer level: Modified independent Equipment used: None                  Balance Overall balance assessment: Needs assistance;History of Falls Sitting-balance support: No upper extremity supported;Feet supported Sitting balance-Leahy Scale: Good     Standing balance support: No upper extremity supported;During functional activity Standing balance-Leahy Scale: Fair                             ADL either performed or assessed with clinical judgement   ADL Overall ADL's : Needs assistance/impaired Eating/Feeding: Set up;Sitting   Grooming: Oral care;Supervision/safety;Standing   Upper Body Bathing: Supervision/ safety;Sitting   Lower Body Bathing: Supervison/  safety;Sit to/from stand   Upper Body Dressing : Supervision/safety;Set up;Sitting   Lower Body Dressing: Supervision/safety;Sit to/from stand Lower Body Dressing Details (indicate cue type and reason): Good ROM to adjust socks Toilet Transfer: Supervision/safety;Ambulation (simulated to recliner)           Functional mobility during ADLs: Supervision/safety General ADL Comments: Performing ADLs and fucntional mobility with supervision. Pt benefits from increased time due to some fatigue. Pt reports frequent falls at home. Provided handout on fall prevention     Vision Baseline Vision/History: Wears glasses Wears Glasses: Reading only Patient Visual Report: No change from baseline     Perception     Praxis      Cognition Arousal/Alertness: Awake/alert Behavior During Therapy: WFL for tasks assessed/performed Overall Cognitive Status: Within Functional Limits for tasks assessed                                          Exercises     Shoulder Instructions       General Comments Provided handout on fall prevention    Pertinent Vitals/ Pain       Pain Assessment: No/denies pain  Home Living Family/patient expects to be discharged to:: Private residence Living Arrangements: Alone Available Help at Discharge: Family;Available PRN/intermittently (Daughter lives nearby and helps out) Type of Home: House Home Access: Stairs to enter Technical brewer of Steps: 1 Entrance Stairs-Rails: None Home Layout:  One level     Bathroom Shower/Tub: Walk-in shower;Tub/shower unit   Bathroom Toilet: Standard     Home Equipment: Environmental consultant - 2 wheels;Cane - single point;Shower seat;Grab bars - tub/shower   Additional Comments: 4 cats      Prior Functioning/Environment Level of Independence: Independent        Comments: ADLs, IADLs, and drives for errands near home.   Frequency           Progress Toward Goals  OT Goals(current goals can now be  found in the care plan section)     Acute Rehab OT Goals Patient Stated Goal: To go home soon OT Goal Formulation: With patient Time For Goal Achievement: 10/20/16 Potential to Achieve Goals: Good  Plan      Co-evaluation                 AM-PAC PT "6 Clicks" Daily Activity     Outcome Measure   Help from another person eating meals?: None Help from another person taking care of personal grooming?: A Little Help from another person toileting, which includes using toliet, bedpan, or urinal?: A Little Help from another person bathing (including washing, rinsing, drying)?: A Little Help from another person to put on and taking off regular upper body clothing?: None Help from another person to put on and taking off regular lower body clothing?: A Little 6 Click Score: 20    End of Session    OT Visit Diagnosis: Unsteadiness on feet (R26.81);Muscle weakness (generalized) (M62.81)   Activity Tolerance Patient tolerated treatment well   Patient Left in chair;with call bell/phone within reach   Nurse Communication Mobility status;Other (comment) (Sitting in recliner)        Time: 9326-7124 OT Time Calculation (min): 19 min  Charges: OT General Charges $OT Visit: 1 Procedure OT Evaluation $OT Eval Low Complexity: 1 Procedure  Arali Somera MSOT, OTR/L Acute Rehab Pager: [phone] Office: [place] 10/06/2016, 1:18 PM

## 2016-10-06 NOTE — Progress Notes (Signed)
PROGRESS NOTE  Marissa Bowen  BSJ:[id number] DOB: Jul 14, 1926 DOA: 10/04/2016 PCP: Gildardo Cranker, DO Outpatient Specialists:  Subjective: Continues to feel week.at baseline yet, her LFTs pending this morning. If LFTs trending down will continue to monitor, if it's not significantly better we'll ask GI to evaluate.  Brief Narrative:  Presented with 2-3 days of generalized fatigue associated with occasional nausea and some vomiting. Yesterday she started to have some chest discomfort which lasted only a few minutes. Reports chest pain was substernal lasting only a few minutes. Currently chest pain-free nausea is currently better. She reported urinary frequency and sense of incomplete evacuation, seen by her PCP 2 days ago prescribed nitrofurantoin, but she still have the same symptoms, UA still showing pus cells, will obtain urine culture and start antibiotics. Has significant transaminitis  Assessment & Plan:   Active Problems:   CAD (coronary artery disease)   Leukocytosis   Atrial fibrillation (HCC)   Essential hypertension   Chest pain   Elevated LFTs   SIRS (systemic inflammatory response syndrome) (HCC)   Diverticulitis   UTI -The recently treated for UTI, still have same symptoms, urinalysis showed 3-6 pus cells. -She is on Cipro will continue, full urine culture.  Transaminitis -AST 625 and ALT 526, total bilirubin is 2.2, no RUQ abdominal pain. -Patient has history of cholecystectomy, no history of viral hepatitis. -CT scan without evidence of obstructive jaundice. -Other causes might suggest medications both nitrofurantoin and amiodarone can cause hepatotoxicity. -Check LFTs if not improving GI to evaluate.  ? Diverticulitis -CT scan showed diverticulosis with mild thickening at the level of mid sigmoid colon. -Per report this is likely chronic, patient denies diarrhea, denies lower abdominal pain. -Currently receiving Metro/Cipro, patient denies lower abdominal pain,  denies diarrhea. -I think diverticulitis ruled out, I'll discontinue metronidazole and continue Cipro for the UTI treatment.  Generalized weakness -This is likely secondary to dehydration and UTI. -Gentle hydration with IV fluids for 1 day.  Paroxysmal A. fib -CHA2DS2-VASc score of 6, she is on Eliquis. -Remained with rate controlled with amiodarone, currently normal sinus rhythm.  CAD -Stable, continue on medications, concurrent chest pain likely of gastrointestinal origin. -3 sets of troponin is negative for cardiac enzymes.   DVT prophylaxis: Eliquis Code Status: Full Code Family Communication:  Disposition Plan:  Diet: Diet Heart Room service appropriate? Yes; Fluid consistency: Thin  Consultants:   None  Procedures:   None  Antimicrobials:   Cipro/Metro  Objective: Vitals:   10/05/16 1211 10/05/16 1959 10/06/16 0351 10/06/16 0500  BP: 110/77 (!) 159/63  (!) 152/52  Pulse: 71 68  (!) 58  Resp: 20 18  18   Temp: 99.6 F (37.6 C) 98.5 F (36.9 C)  98.7 F (37.1 C)  TempSrc: Oral Oral  Oral  SpO2: 97% 95%  96%  Weight:   68.1 kg (150 lb 1.6 oz)   Height:        Intake/Output Summary (Last 24 hours) at 10/06/16 0952 Last data filed at 10/06/16 0930  Gross per 24 hour  Intake             1520 ml  Output             1451 ml  Net               69 ml   Filed Weights   10/04/16 2329 10/06/16 0351  Weight: 67.9 kg (149 lb 9.6 oz) 68.1 kg (150 lb 1.6 oz)    Examination:No changes clinically  General exam: Appears calm and comfortable  Respiratory system: Clear to auscultation. Respiratory effort normal. Cardiovascular system: S1 & S2 heard, RRR. No JVD, murmurs, rubs, gallops or clicks. No pedal edema. Gastrointestinal system: Abdomen is nondistended, soft and nontender. No organomegaly or masses felt. Normal bowel sounds heard. Central nervous system: Alert and oriented. No focal neurological deficits. Extremities: Symmetric 5 x 5 power. Skin: No rashes,  lesions or ulcers Psychiatry: Judgement and insight appear normal. Mood & affect appropriate.   Data Reviewed: I have personally reviewed following labs and imaging studies  CBC:  Recent Labs Lab 10/04/16 1707 10/05/16 0226  WBC 18.1* 14.1*  NEUTROABS 16.3* 12.3*  HGB 13.1 12.4  HCT 39.8 37.6  MCV 91.7 91.0  PLT 198 355   Basic Metabolic Panel:  Recent Labs Lab 10/04/16 1707 10/05/16 0226  NA 135 133*  K 4.2 3.7  CL 99* 98*  CO2 27 26  GLUCOSE 156* 163*  BUN 15 11  CREATININE 0.83 0.67  CALCIUM 9.3 9.2  MG  --  2.0  PHOS  --  3.3   GFR: Estimated Creatinine Clearance: 50 mL/min (by C-G formula based on SCr of 0.67 mg/dL). Liver Function Tests:  Recent Labs Lab 10/04/16 1707 10/05/16 0226  AST 454* 625*  ALT 226* 526*  ALKPHOS 114 151*  BILITOT 2.1* 2.2*  PROT 6.9 6.3*  ALBUMIN 3.9 3.5    Recent Labs Lab 10/05/16 0226  LIPASE 28   No results for input(s): AMMONIA in the last 168 hours. Coagulation Profile:  Recent Labs Lab 10/04/16 2314  INR 1.14   Cardiac Enzymes:  Recent Labs Lab 10/04/16 2233 10/04/16 2315 10/05/16 0958  TROPONINI <0.03 <0.03 <0.03   BNP (last 3 results) No results for input(s): PROBNP in the last 8760 hours. HbA1C: No results for input(s): HGBA1C in the last 72 hours. CBG: No results for input(s): GLUCAP in the last 168 hours. Lipid Profile: No results for input(s): CHOL, HDL, LDLCALC, TRIG, CHOLHDL, LDLDIRECT in the last 72 hours. Thyroid Function Tests:  Recent Labs  10/05/16 0226  TSH 0.807   Anemia Panel: No results for input(s): VITAMINB12, FOLATE, FERRITIN, TIBC, IRON, RETICCTPCT in the last 72 hours. Urine analysis:    Component Value Date/Time   COLORURINE AMBER (A) 10/04/2016 1825   APPEARANCEUR HAZY (A) 10/04/2016 1825   LABSPEC 1.014 10/04/2016 1825   PHURINE 5.0 10/04/2016 1825   GLUCOSEU NEGATIVE 10/04/2016 1825   HGBUR NEGATIVE 10/04/2016 1825   BILIRUBINUR NEGATIVE 10/04/2016 1825    BILIRUBINUR 2+ 11/09/2014 1429   KETONESUR 5 (A) 10/04/2016 1825   PROTEINUR 100 (A) 10/04/2016 1825   UROBILINOGEN negative 11/09/2014 1429   UROBILINOGEN 0.2 01/12/2013 1618   NITRITE NEGATIVE 10/04/2016 1825   LEUKOCYTESUR TRACE (A) 10/04/2016 1825   Sepsis Labs: @LABRCNTIP (procalcitonin:4,lacticidven:4)  )No results found for this or any previous visit (from the past 240 hour(s)).   Invalid input(s): PROCALCITONIN, LACTICACIDVEN   Radiology Studies: Dg Chest 2 View  Result Date: 10/04/2016 CLINICAL DATA:  Chest pain and weakness. EXAM: CHEST  2 VIEW COMPARISON:  11/05/2014 FINDINGS: Previous median sternotomy and CABG procedure. Aortic atherosclerosis noted. No pleural effusion or edema. No airspace opacities. Spondylosis noted within the thoracic spine. IMPRESSION: 1. No acute cardiopulmonary abnormalities. 2.  Aortic Atherosclerosis (ICD10-I70.0). Electronically Signed   By: Kerby Moors M.D.   On: 10/04/2016 16:42   Ct Abdomen Pelvis W Contrast  Result Date: 10/04/2016 CLINICAL DATA:  83 year old with abdominal pain, nausea and vomiting for 3  days. EXAM: CT ABDOMEN AND PELVIS WITH CONTRAST TECHNIQUE: Multidetector CT imaging of the abdomen and pelvis was performed using the standard protocol following bolus administration of intravenous contrast. CONTRAST:  174mL ISOVUE-300 IOPAMIDOL (ISOVUE-300) INJECTION 61% COMPARISON:  CTA 06/08/2012 FINDINGS: Lower chest: Dependent atelectasis in both lower lobes. No pleural fluid. Atherosclerosis of the descending thoracic aorta. Hepatobiliary: No focal liver abnormality is seen. Status post cholecystectomy. No biliary dilatation. Pancreas: Unremarkable. No pancreatic ductal dilatation or surrounding inflammatory changes. Spleen: Normal in size without focal abnormality. Adrenals/Urinary Tract: No adrenal nodule. No hydronephrosis or perinephric edema. Partially duplicated right renal collecting system. 2.5 cm simple cyst in the posterior mid left  kidney. Urinary bladder is physiologically distended. Stomach/Bowel: Multifocal colonic diverticulosis, prominent in the descending and sigmoid colon. Scattered areas of mural wall thickening in the mid sigmoid without pericolonic edema. Normal appendix. Small bowel is decompressed. Stomach is physiologically distended. Vascular/Lymphatic: Dense aortic atherosclerosis. No aneurysm. Circumaortic left renal vein. No acute vascular abnormality. No adenopathy. Reproductive: Status post hysterectomy. No adnexal masses. Other: No free air, free fluid, or intra-abdominal fluid collection. Tiny fat containing umbilical hernia. Musculoskeletal: Degenerative change in the lumbar spine and both hips. There are no acute or suspicious osseous abnormalities. IMPRESSION: 1. Colonic diverticulosis. Mild mural wall thickening in the mid sigmoid, likely chronic, cannot exclude mild acute diverticulitis, particularly if there is lower abdominal pain. 2. No additional acute abnormality. 3. Aortic atherosclerosis without aneurysm. Electronically Signed   By: Jeb Levering M.D.   On: 10/04/2016 23:11        Scheduled Meds: . amiodarone  100 mg Oral Daily  . apixaban  5 mg Oral BID  . LORazepam  2 mg Oral QHS  . pantoprazole  40 mg Oral BID  . sodium chloride flush  3 mL Intravenous Q12H  . zolpidem  5 mg Oral QHS   Continuous Infusions: . ciprofloxacin Stopped (10/05/16 2249)  . metronidazole Stopped (10/06/16 0437)     LOS: 1 day    Time spent: 35 minutes    Pablo Stauffer A, MD [facility] Pager [phone]  If 7PM-7AM, please contact night-coverage [email or web address] Password [email or web address] 10/06/2016, 9:52 AM

## 2016-10-07 DIAGNOSIS — R6510 Systemic inflammatory response syndrome (SIRS) of non-infectious origin without acute organ dysfunction: Secondary | ICD-10-CM

## 2016-10-07 DIAGNOSIS — R1010 Upper abdominal pain, unspecified: Secondary | ICD-10-CM

## 2016-10-07 LAB — COMPREHENSIVE METABOLIC PANEL WITH GFR
ALT: 196 U/L — ABNORMAL HIGH (ref 14–54)
AST: 64 U/L — ABNORMAL HIGH (ref 15–41)
Albumin: 3.2 g/dL — ABNORMAL LOW (ref 3.5–5.0)
Alkaline Phosphatase: 119 U/L (ref 38–126)
Anion gap: 9 (ref 5–15)
BUN: 10 mg/dL (ref 6–20)
CO2: 25 mmol/L (ref 22–32)
Calcium: 8.7 mg/dL — ABNORMAL LOW (ref 8.9–10.3)
Chloride: 104 mmol/L (ref 101–111)
Creatinine, Ser: 0.65 mg/dL (ref 0.44–1.00)
GFR calc Af Amer: >60 mL/min
GFR calc non Af Amer: >60 mL/min
Glucose, Bld: 103 mg/dL — ABNORMAL HIGH (ref 65–99)
Potassium: 4.2 mmol/L (ref 3.5–5.1)
Sodium: 138 mmol/L (ref 135–145)
Total Bilirubin: 0.6 mg/dL (ref 0.3–1.2)
Total Protein: 6.1 g/dL — ABNORMAL LOW (ref 6.5–8.1)

## 2016-10-07 LAB — HEPATITIS PANEL, ACUTE
HCV Ab: <0.1 s/co ratio (ref 0.0–0.9)
Hep A IgM: NEGATIVE
Hep B C IgM: POSITIVE — AB
Hepatitis B Surface Ag: NEGATIVE

## 2016-10-07 MED ORDER — CIPROFLOXACIN HCL 500 MG PO TABS: 500.0000 mg | ORAL_TABLET | Freq: Two times a day (BID) | ORAL | 0 refills | Status: AC

## 2016-10-07 NOTE — ED Provider Notes (Signed)
[facility] DEPT Provider Note   CSN: [id number] Arrival date & time: 10/04/16  1553     History   Chief Complaint Chief Complaint  Patient presents with  . Chest Pain    HPI Marissa Bowen is a 83 y.o. female.  HPI Patient presents to the emergency department with chest discomfort that started earlier today.  She also is having problems with urination.  Patient states that she does live alone. The patient denies  shortness of breath, headache,blurred vision, neck pain, fever, cough, weakness, numbness, dizziness, anorexia, edema, abdominal pain, nausea, vomiting, diarrhea, rash, back painhematemesis, bloody stool, near syncope, or syncope.  Patient states she did not take any medications prior to arrival for her symptoms.  She states that the chest pain was intermittent and would last for several minutes. Past Medical History:  Diagnosis Date  . Anemia   . Anxiety   . BACK PAIN, LUMBAR 09/27/2009  . CAD (coronary artery disease)    a. s/p remote CABG 1997, 3V.;  b. LexiScan Myoview (8/15): No ischemia, EF 72%, normal study;  c. Echo 12/13 - EF 60-65%, no significant valvular abnormalities, normal RV size and systolic function.   . CVA (cerebral infarction)    a. Thalamic infarct 09/2009. //  b. hx of TIA in 2008  . Gastric ulcer   . GERD 11/25/2006  . GI bleed    a. 09/2009: secondary to antral ulcer.  . Hiatal hernia   . History of diverticulitis Dx 02/2012  . History of MI (myocardial infarction)   . HYPERLIPIDEMIA   . HYPERTENSION   . IBS 12/07/2008  . MIGRAINE WITH AURA 08/13/2007  . Mild carotid artery disease ([place])    a. 0-39% bilat 05/2011 (f/u recommended 05/2013). //  b. Carotid US 3/17 - Bilat ICA 1-39% >> FU prn  . Muscle weakness (generalized) 09/27/2009  . OSTEOARTHRITIS 08/13/2007  . PAF (paroxysmal atrial fibrillation) ([place]) 11/25/2006   a. Multaq >> changes to Amiodarone 2/2 $$  //  Eliquis (CHADS2-VASc = 6)  . Urinary, incontinence, stress female    wears  Pessary    Patient Active Problem List   Diagnosis Date Noted  . Diverticulitis 10/05/2016  . Chest pain 10/04/2016  . Elevated LFTs 10/04/2016  . SIRS (systemic inflammatory response syndrome) ([place]) 10/04/2016  . Macular degeneration of both eyes 09/22/2015  . Carotid stenosis 08/10/2015  . On amiodarone therapy 08/10/2015  . Chronic cholecystitis with calculus 01/03/2015  . Allergic rhinitis 08/05/2014  . Essential hypertension 05/03/2014  . Osteoarthritis 09/29/2013  . Routine general medical examination at a health care facility 09/29/2013  . Nausea alone 08/03/2013  . Depression with anxiety 08/03/2013  . Atrial fibrillation ([place]) 09/02/2012  . Anemia due to chronic blood loss 08/18/2012  . Intractable nausea and vomiting 06/08/2012  . Diarrhea 06/08/2012  . Leukocytosis 06/08/2012  . Orthostatic hypotension 06/08/2012  . PALPITATIONS, HX OF 01/04/2010  . ACUTE POSTHEMORRHAGIC ANEMIA 10/23/2009  . BACK PAIN, LUMBAR 09/27/2009  . MUSCLE WEAKNESS (GENERALIZED) 09/27/2009  . IBS 12/07/2008  . Fatigue 07/21/2008  . MIGRAINE WITH AURA 08/13/2007  . OSTEOARTHRITIS 08/13/2007  . TRANSIENT ISCHEMIC ATTACK 04/13/2007  . Hyperlipidemia LDL goal <100 11/25/2006  . CAD (coronary artery disease) 11/25/2006  . GERD 11/25/2006  . DIVERTICULITIS, HX OF 11/25/2006    Past Surgical History:  Procedure Laterality Date  . ABDOMINAL HYSTERECTOMY  1973  . CATARACT EXTRACTION W/ INTRAOCULAR LENS  IMPLANT, BILATERAL Bilateral 1999  . CHOLECYSTECTOMY N/A 01/03/2015  Procedure: LAPAROSCOPIC CHOLECYSTECTOMY WITH INTRAOPERATIVE CHOLANGIOGRAM;  Surgeon: Donnie Mesa, MD;  Location: [facility];  Service: General;  Laterality: N/A;  . CORONARY ANGIOPLASTY  1997   Dr Lia Foyer  . CORONARY ARTERY BYPASS GRAFT  1997   Dr Melvenia Needles  . LAPAROSCOPIC CHOLECYSTECTOMY  01/03/2015  . TONSILLECTOMY      OB History    No data available       Home Medications    Prior to Admission medications     Medication Sig Start Date End Date Taking? Authorizing Provider  acetaminophen (TYLENOL) 500 MG tablet Take 500-1,000 mg by mouth every 6 (six) hours as needed for moderate pain.    Yes [provider]  amiodarone (PACERONE) 200 MG tablet Take 100 mg by mouth daily. 07/04/16  Yes [provider]  ELIQUIS 5 MG TABS tablet Take 1 tablet (5 mg total) by mouth 2 (two) times daily. 07/26/16  Yes Josue Hector, MD  LORazepam (ATIVAN) 1 MG tablet TAKE ONE TABLET EVERY EIGHT HOURS AS NEEDED FOR ANXIETY Patient taking differently: TAKE TWO TABLETS (2 MG) BY MOUTH DAILY AT BEDTIME, MAY ALSO TAKE 1 TABLET (1 MG) DURING THE DAY AS NEEDED FOR ANXIETY 08/30/16  Yes Reed, Tiffany L, DO  losartan (COZAAR) 50 MG tablet TAKE ONE TABLET TWICE DAILY 09/11/16  Yes Josue Hector, MD  meclizine (ANTIVERT) 12.5 MG tablet Take one to two tablets by mouth every 8 hours as needed for dizziness Patient taking differently: Take 12.5-25 mg by mouth every 8 (eight) hours as needed for dizziness.  10/03/16  Yes Gildardo Cranker, DO  Multiple Vitamin (MULTIVITAMIN WITH MINERALS) TABS tablet Take 1 tablet by mouth daily.   Yes [provider]  Multiple Vitamins-Minerals (PRESERVISION AREDS) TABS Take 1 tablet by mouth 2 (two) times daily.    Yes [provider]  niacin 500 MG CR capsule Take 500 mg by mouth 2 (two) times daily.    Yes [provider]  nitrofurantoin (MACRODANTIN) 100 MG capsule Take 100 mg by mouth 2 (two) times daily. 5 day course filled 10/03/16 10/03/16  Yes [provider]  pantoprazole (PROTONIX) 40 MG tablet TAKE ONE TABLET TWICE DAILY 09/17/16  Yes Eulas Post, Monica, DO  sodium chloride (OCEAN) 0.65 % SOLN nasal spray Place 1 spray into both nostrils at bedtime.    Yes [provider]  Tetrahydrozoline HCl (VISINE OP) Place 2 drops into both eyes 2 (two) times daily as needed (for dry eyes).    Yes [provider]  traMADol (ULTRAM) 50 MG tablet TAKE  ONE TABLET EVERY EIGHT HOURS AS NEEDED FOR MODERATE PAIN TO SEVERE PAIN 07/04/16  Yes Lauree Chandler, NP  triamcinolone cream (KENALOG) 0.1 % Apply 1 application topically 2 (two) times daily as needed (dry skin).   Yes [provider]  zolpidem (AMBIEN) 10 MG tablet Take 10 mg by mouth at bedtime.   Yes [provider]  DULoxetine (CYMBALTA) 30 MG capsule Take 1 capsule (30 mg total) by mouth daily. Patient not taking: Reported on 10/04/2016 09/13/16   Gildardo Cranker, DO  triamcinolone lotion (KENALOG) 0.1 % Apply 1 application topically daily as needed. Patient not taking: Reported on 10/04/2016 09/13/16   Gildardo Cranker, DO    Family History Family History  Problem Relation Age of Onset  . Heart attack Mother   . Heart failure Father   . Throat cancer Sister   . Heart disease Sister   . Coronary artery disease Sister   .  Diabetes Sister   . Heart attack Sister   . Colon cancer Neg Hx     Social History Social History  Substance Use Topics  . Smoking status: Never Smoker  . Smokeless tobacco: Never Used  . Alcohol use No     Allergies   Asa [aspirin]; Crestor [rosuvastatin calcium]; Hydrocodone; Ibuprofen; Oxycodone hcl; Penicillins; Prednisone; Statins; Morphine and related; and Pristiq [desvenlafaxine]   Review of Systems Review of Systems All other systems negative except as documented in the HPI. All pertinent positives and negatives as reviewed in the HPI.  Physical Exam Updated Vital Signs BP (!) 160/85 (BP Location: Right Arm)   Pulse 68   Temp 98.4 F (36.9 C) (Oral)   Resp 18   Ht 5\' 8"  (1.727 m)   Wt 68.4 kg (150 lb 14.4 oz)   SpO2 95%   BMI 22.94 kg/m   Physical Exam  Constitutional: She is oriented to person, place, and time. She appears well-developed and well-nourished. No distress.  HENT:  Head: Normocephalic and atraumatic.  Mouth/Throat: Oropharynx is clear and moist.  Eyes: Pupils are equal, round, and reactive to light.    Neck: Normal range of motion. Neck supple.  Cardiovascular: Normal rate, regular rhythm and normal heart sounds.  Exam reveals no gallop and no friction rub.   No murmur heard. Pulmonary/Chest: Effort normal and breath sounds normal. No respiratory distress. She has no wheezes.  Abdominal: Soft. Bowel sounds are normal. She exhibits no distension. There is no tenderness.  Neurological: She is alert and oriented to person, place, and time. She exhibits normal muscle tone. Coordination normal.  Skin: Skin is warm and dry. Capillary refill takes less than 2 seconds. No rash noted. No erythema.  Psychiatric: She has a normal mood and affect. Her behavior is normal.  Nursing note and vitals reviewed.    ED Treatments / Results  Labs (all labs ordered are listed, but only abnormal results are displayed) Labs Reviewed  URINE CULTURE - Abnormal; Notable for the following:       Result Value   Culture MULTIPLE SPECIES PRESENT, SUGGEST RECOLLECTION (*)    All other components within normal limits  URINALYSIS, ROUTINE W REFLEX MICROSCOPIC - Abnormal; Notable for the following:    Color, Urine AMBER (*)    APPearance HAZY (*)    Ketones, ur 5 (*)    Protein, ur 100 (*)    Leukocytes, UA TRACE (*)    Bacteria, UA RARE (*)    Squamous Epithelial / LPF 0-5 (*)    Non Squamous Epithelial 0-5 (*)    All other components within normal limits  COMPREHENSIVE METABOLIC PANEL - Abnormal; Notable for the following:    Chloride 99 (*)    Glucose, Bld 156 (*)    AST 454 (*)    ALT 226 (*)    Total Bilirubin 2.1 (*)    All other components within normal limits  CBC WITH DIFFERENTIAL/PLATELET - Abnormal; Notable for the following:    WBC 18.1 (*)    Neutro Abs 16.3 (*)    All other components within normal limits  ACETAMINOPHEN LEVEL - Abnormal; Notable for the following:    Acetaminophen (Tylenol), Serum <10 (*)    All other components within normal limits  COMPREHENSIVE METABOLIC PANEL -  Abnormal; Notable for the following:    Sodium 133 (*)    Chloride 98 (*)    Glucose, Bld 163 (*)    Total Protein 6.3 (*)  AST 625 (*)    ALT 526 (*)    Alkaline Phosphatase 151 (*)    Total Bilirubin 2.2 (*)    All other components within normal limits  CBC WITH DIFFERENTIAL/PLATELET - Abnormal; Notable for the following:    WBC 14.1 (*)    Neutro Abs 12.3 (*)    All other components within normal limits  LACTATE DEHYDROGENASE - Abnormal; Notable for the following:    LDH 528 (*)    All other components within normal limits  COMPREHENSIVE METABOLIC PANEL - Abnormal; Notable for the following:    Glucose, Bld 185 (*)    AST 162 (*)    ALT 306 (*)    Alkaline Phosphatase 141 (*)    All other components within normal limits  COMPREHENSIVE METABOLIC PANEL - Abnormal; Notable for the following:    Glucose, Bld 103 (*)    Calcium 8.7 (*)    Total Protein 6.1 (*)    Albumin 3.2 (*)    AST 64 (*)    ALT 196 (*)    All other components within normal limits  TROPONIN I  TROPONIN I  TROPONIN I  PROTIME-INR  MAGNESIUM  PHOSPHORUS  TSH  HEMOGLOBIN A1C  LIPASE, BLOOD  HEPATITIS PANEL, ACUTE  I-STAT TROPOININ, ED  I-STAT CG4 LACTIC ACID, ED    EKG  EKG Interpretation  Date/Time:  Friday October 04 2016 16:03:10 EDT Ventricular Rate:  75 PR Interval:    QRS Duration: 101 QT Interval:  433 QTC Calculation: 484 R Axis:   32 Text Interpretation:  Sinus rhythm Borderline repolarization abnormality Confirmed by RAY MD, Andee Poles ([id number]) on 10/04/2016 6:59:29 PM       Radiology No results found.  Procedures Procedures (including critical care time)  Medications Ordered in ED Medications  zolpidem (AMBIEN) tablet 5 mg (5 mg Oral Given 10/06/16 2119)  pantoprazole (PROTONIX) EC tablet 40 mg (40 mg Oral Given 10/06/16 2120)  LORazepam (ATIVAN) tablet 2 mg (2 mg Oral Given 10/06/16 2119)  traMADol (ULTRAM) tablet 50 mg (50 mg Oral Given 10/06/16 1334)  apixaban (ELIQUIS)  tablet 5 mg (5 mg Oral Given 10/06/16 2120)  amiodarone (PACERONE) tablet 100 mg (100 mg Oral Given 10/06/16 0951)  sodium chloride flush (NS) 0.9 % injection 3 mL (3 mLs Intravenous Given 10/06/16 2129)  ondansetron (ZOFRAN) tablet 4 mg (not administered)    Or  ondansetron (ZOFRAN) injection 4 mg (not administered)  0.9 %  sodium chloride infusion ( Intravenous New Bag/Given 10/05/16 0101)  ciprofloxacin (CIPRO) IVPB 400 mg (0 mg Intravenous Stopped 10/06/16 2228)  0.9 % NaCl with KCl 20 mEq/ L  infusion ( Intravenous New Bag/Given 10/07/16 0300)  iopamidol (ISOVUE-300) 61 % injection (100 mLs  Contrast Given 10/04/16 2246)     Initial Impression / Assessment and Plan / ED Course  I have reviewed the triage vital signs and the nursing notes.  Pertinent labs & imaging results that were available during my care of the patient were reviewed by me and considered in my medical decision making (see chart for details).     Patient needed admission to the hospital.  I spoke with the hospitalist.  The patient will be admitted for further evaluation.   Final Clinical Impressions(s) / ED Diagnoses   Final diagnoses:  Pain of upper abdomen  Other chest pain    New Prescriptions Current Discharge Medication List       Dalia Heading, Hershal Coria 10/07/16 [id number]    Pattricia Boss, MD  10/09/16 2009  

## 2016-10-07 NOTE — Progress Notes (Signed)
Patient discharged to home with all her belongings.  Daughter here to pick up.  Escorted to the main entrance by staff via wheelchair.  No concerns voiced at the time of discharge. Marcille Blanco, RN

## 2016-10-07 NOTE — Progress Notes (Signed)
Pt wash her face but stated that she will do her bath when goes home today

## 2016-10-07 NOTE — Progress Notes (Signed)
Pt pharmacy called regarding contraindication in two medications. Spoke with Dr. Hartford Poli who is ok with the two prescriptions being filled together.

## 2016-10-07 NOTE — Progress Notes (Signed)
Physical Therapy Treatment Patient Details Name: Marissa Bowen MRN: [id number] DOB: 1926-09-27 Today's Date: 10/07/2016    History of Present Illness Patient is an 83 yo female admitted 10/04/16 with 2-3 days of generalized fatigue associated with occasional nausea and some vomiting.   Patient with SIRS, UTI.      PMH:  CAD, CVA, MI, HTN, HLD, PAF    PT Comments    Patient tolerated increased gait distance and negotiated one step to simulate home entrance. Pt continues to be unsteady without single UE support with ambulation. Therapist discussed using RW initially upon d/c to decrease risk of falls and pt agreeable. Pt reported that her daughter can assist with household chores. Pt will continue to benefit from further skilled PT services to maximize independence and safety with mobility.   Follow Up Recommendations  Home health PT;Supervision - Intermittent     Equipment Recommendations  None recommended by PT    Recommendations for Other Services       Precautions / Restrictions Precautions Precautions: Fall Precaution Comments: Has had a few falls at home.  Talked about limiting what she carries when stepping up into house. Restrictions Weight Bearing Restrictions: No    Mobility  Bed Mobility Overal bed mobility: Modified Independent             General bed mobility comments: Use of bed rail and increased time.  Transfers Overall transfer level: Modified independent Equipment used: None                Ambulation/Gait Ambulation/Gait assistance: Min guard;Min assist Ambulation Distance (Feet): 150 Feet Assistive device: None (rail in hallway ~50% of the time) Gait Pattern/deviations: Step-through pattern;Decreased stride length;Shuffle;Drifts right/left;Trunk flexed Gait velocity: decreased   General Gait Details: pt unsteady without single UE support; with LOB X1 and gait deviations with horizontal head turns; one seated rest break   Stairs Stairs:  Yes   Stair Management: One rail Left;Step to pattern Number of Stairs: 1 General stair comments: pt required use of rail and min guard A for safety; pt has one step without rail at home entrance  Wheelchair Mobility    Modified Rankin (Stroke Patients Only)       Balance Overall balance assessment: Needs assistance;History of Falls Sitting-balance support: No upper extremity supported;Feet supported Sitting balance-Leahy Scale: Good     Standing balance support: No upper extremity supported;During functional activity Standing balance-Leahy Scale: Fair                              Cognition Arousal/Alertness: Awake/alert Behavior During Therapy: WFL for tasks assessed/performed Overall Cognitive Status: Within Functional Limits for tasks assessed                                        Exercises      General Comments        Pertinent Vitals/Pain Pain Assessment: No/denies pain    Home Living                      Prior Function            PT Goals (current goals can now be found in the care plan section) Acute Rehab PT Goals PT Goal Formulation: With patient Time For Goal Achievement: 10/12/16 Potential to Achieve Goals: Good Progress towards PT goals: Progressing  toward goals    Frequency    Min 3X/week      PT Plan Current plan remains appropriate    Co-evaluation              AM-PAC PT "6 Clicks" Daily Activity  Outcome Measure  Difficulty turning over in bed (including adjusting bedclothes, sheets and blankets)?: None Difficulty moving from lying on back to sitting on the side of the bed? : None Difficulty sitting down on and standing up from a chair with arms (e.g., wheelchair, bedside commode, etc,.)?: None Help needed moving to and from a bed to chair (including a wheelchair)?: A Little Help needed walking in hospital room?: A Little Help needed climbing 3-5 steps with a railing? : A Little 6  Click Score: 21    End of Session Equipment Utilized During Treatment: Gait belt Activity Tolerance: Patient tolerated treatment well Patient left: in bed;with call bell/phone within reach (sitting EOB for dinner) Nurse Communication: Mobility status PT Visit Diagnosis: Unsteadiness on feet (R26.81);Other abnormalities of gait and mobility (R26.89);Muscle weakness (generalized) (M62.81);History of falling (Z91.81)     Time: 9983-3825 PT Time Calculation (min) (ACUTE ONLY): 33 min  Charges:  $Gait Training: 8-22 mins $Therapeutic Activity: 8-22 mins                    G Codes:       Earney Navy, PTA Pager: [phone]     Darliss Cheney 10/07/2016, 11:20 AM

## 2016-10-07 NOTE — Discharge Summary (Signed)
Physician Discharge Summary  Marissa Bowen OVF:[id number] DOB: Nov 22, 1926 DOA: 10/04/2016  PCP: Gildardo Cranker, DO  Admit date: 10/04/2016 Discharge date: 10/07/2016  Admitted From: Home Disposition: Home  Recommendations for Outpatient Follow-up:  1. Follow up with PCP in 1-2 weeks 2. Please obtain CMP/CBC in one week  Home Health: PT Equipment/Devices:NA  Discharge Condition: Stable CODE STATUS: Full Code Diet recommendation: Diet Heart Room service appropriate? Yes; Fluid consistency: Thin Diet - low sodium heart healthy  Brief/Interim Summary: Presented with 2-3 days of generalized fatigue associated with occasional nausea and some vomiting.Yesterday she started to have some chest discomfort which lasted only a few minutes. Reports chest pain was substernal lasting only a few minutes.Currently chest pain-free nausea is currently better. She reported urinary frequency and sense of incomplete evacuation, seen by her PCP 2 days ago prescribed nitrofurantoin, but she still have the same symptoms, UA still showing pus cells, will obtain urine culture and start antibiotics. Has significant transaminitis   Discharge Diagnoses:  Active Problems:   CAD (coronary artery disease)   Leukocytosis   Atrial fibrillation (HCC)   Essential hypertension   Chest pain   Elevated LFTs   SIRS (systemic inflammatory response syndrome) (HCC)   Diverticulitis    UTI -The recently treated for UTI, still have same symptoms, urinalysis showed 3-6 pus cells. -Likely partially treated UTI, culture showed multiple morphotypes. -LUT symptoms improved after initiation of Cipro, discharge and 3 more days of Cipro.  Transaminitis -AST 625 and ALT 526, total bilirubin is 2.2, no RUQ abdominal pain. -Patient has history of cholecystectomy, no history of viral hepatitis (negative) 2014). -CT scan without evidence of obstructive jaundice. -Other consideration nitrofurantoin, amiodarone and  dehydration. -Patient hydrated with IV fluids, the LFTs improved, check CMP in 1 week. -AST 64 on discharge, ALT 196 and total bilirubin 0.6.  ? Diverticulitis -CT scan showed diverticulosis with mild thickening at the level of mid sigmoid colon. -Per report this is likely chronic, patient denies diarrhea, denies lower abdominal pain. -Currently receiving [facility]/Cipro, patient denies lower abdominal pain, denies diarrhea. -I think diverticulitis ruled out, I'll discontinue metronidazole and continue Cipro for the UTI treatment.  Generalized weakness -This is likely secondary to dehydration and UTI. -Patient reported she had recently poor appetite and done drink enough. -This is improved, seen by PT and recommended home health PT.  Paroxysmal A. fib -CHA2DS2-VASc score of 6, she is on Eliquis. -Remained with rate controlled with amiodarone, currently normal sinus rhythm.  CAD -Stable, continue on medications, concurrent chest pain likely of gastrointestinal origin, this is resolved. -3 sets of troponin is negative for cardiac enzymes.   Discharge Instructions  Discharge Instructions    Diet - low sodium heart healthy    Complete by:  As directed    Increase activity slowly    Complete by:  As directed      Allergies as of 10/07/2016      Reactions   Asa [aspirin] Other (See Comments)   Bleeding ulcer   Crestor [rosuvastatin Calcium] Other (See Comments)   myalgia   Hydrocodone Other (See Comments)   REACTION: migraines   Ibuprofen Other (See Comments)   ulcers   Oxycodone Hcl Other (See Comments)   REACTION: migraines   Penicillins Swelling, Rash   Has patient had a PCN reaction causing immediate rash, facial/tongue/throat swelling, SOB or lightheadedness with hypotension: Yes Has patient had a PCN reaction causing severe rash involving mucus membranes or skin necrosis: No Has patient had a PCN  reaction that required hospitalization No Has patient had a PCN reaction  occurring within the last 10 years: No If all of the above answers are "NO", then may proceed with Cephalosporin use.   Prednisone Other (See Comments)   ulcers   Statins Other (See Comments)   Aches and pains   Morphine And Related Other (See Comments)    Had hallucinations with morphine sulfate in the past     Pristiq [desvenlafaxine] Other (See Comments)   Drowsiness and hangover sensation      Medication List    STOP taking these medications   DULoxetine 30 MG capsule Commonly known as:  CYMBALTA   nitrofurantoin 100 MG capsule Commonly known as:  MACRODANTIN     TAKE these medications   acetaminophen 500 MG tablet Commonly known as:  TYLENOL Take 500-1,000 mg by mouth every 6 (six) hours as needed for moderate pain.   amiodarone 200 MG tablet Commonly known as:  PACERONE Take 100 mg by mouth daily.   ciprofloxacin 500 MG tablet Commonly known as:  CIPRO Take 1 tablet (500 mg total) by mouth 2 (two) times daily.   ELIQUIS 5 MG Tabs tablet Generic drug:  apixaban Take 1 tablet (5 mg total) by mouth 2 (two) times daily.   LORazepam 1 MG tablet Commonly known as:  ATIVAN TAKE ONE TABLET EVERY EIGHT HOURS AS NEEDED FOR ANXIETY What changed:  See the new instructions.   losartan 50 MG tablet Commonly known as:  COZAAR TAKE ONE TABLET TWICE DAILY   meclizine 12.5 MG tablet Commonly known as:  ANTIVERT Take one to two tablets by mouth every 8 hours as needed for dizziness What changed:  how much to take  how to take this  when to take this  reasons to take this  additional instructions   multivitamin with minerals Tabs tablet Take 1 tablet by mouth daily.   niacin 500 MG CR capsule Take 500 mg by mouth 2 (two) times daily.   pantoprazole 40 MG tablet Commonly known as:  PROTONIX TAKE ONE TABLET TWICE DAILY   PRESERVISION AREDS Tabs Take 1 tablet by mouth 2 (two) times daily.   sodium chloride 0.65 % Soln nasal spray Commonly known as:   OCEAN Place 1 spray into both nostrils at bedtime.   traMADol 50 MG tablet Commonly known as:  ULTRAM TAKE ONE TABLET EVERY EIGHT HOURS AS NEEDED FOR MODERATE PAIN TO SEVERE PAIN   triamcinolone cream 0.1 % Commonly known as:  KENALOG Apply 1 application topically 2 (two) times daily as needed (dry skin). What changed:  Another medication with the same name was removed. Continue taking this medication, and follow the directions you see here.   VISINE OP Place 2 drops into both eyes 2 (two) times daily as needed (for dry eyes).   zolpidem 10 MG tablet Commonly known as:  AMBIEN Take 10 mg by mouth at bedtime.      Follow-up Information    Gildardo Cranker, DO Follow up in 1 week(s).   Specialty:  Internal Medicine Contact information: [place] [id number] [phone]          Allergies  Allergen Reactions  . Asa [Aspirin] Other (See Comments)    Bleeding ulcer  . Crestor [Rosuvastatin Calcium] Other (See Comments)    myalgia  . Hydrocodone Other (See Comments)    REACTION: migraines  . Ibuprofen Other (See Comments)    ulcers  . Oxycodone Hcl Other (See Comments)  REACTION: migraines  . Penicillins Swelling and Rash    Has patient had a PCN reaction causing immediate rash, facial/tongue/throat swelling, SOB or lightheadedness with hypotension: Yes Has patient had a PCN reaction causing severe rash involving mucus membranes or skin necrosis: No Has patient had a PCN reaction that required hospitalization No Has patient had a PCN reaction occurring within the last 10 years: No If all of the above answers are "NO", then may proceed with Cephalosporin use.   . Prednisone Other (See Comments)    ulcers  . Statins Other (See Comments)    Aches and pains  . Morphine And Related Other (See Comments)     Had hallucinations with morphine sulfate in the past    . Pristiq [Desvenlafaxine] Other (See Comments)    Drowsiness and hangover sensation     Consultations:  None   Procedures (Echo, Carotid, EGD, Colonoscopy, ERCP)   Radiological studies: Dg Chest 2 View  Result Date: 10/04/2016 CLINICAL DATA:  Chest pain and weakness. EXAM: CHEST  2 VIEW COMPARISON:  11/05/2014 FINDINGS: Previous median sternotomy and CABG procedure. Aortic atherosclerosis noted. No pleural effusion or edema. No airspace opacities. Spondylosis noted within the thoracic spine. IMPRESSION: 1. No acute cardiopulmonary abnormalities. 2.  Aortic Atherosclerosis (ICD10-I70.0). Electronically Signed   By: Kerby Moors M.D.   On: 10/04/2016 16:42   Ct Abdomen Pelvis W Contrast  Result Date: 10/04/2016 CLINICAL DATA:  83 year old with abdominal pain, nausea and vomiting for 3 days. EXAM: CT ABDOMEN AND PELVIS WITH CONTRAST TECHNIQUE: Multidetector CT imaging of the abdomen and pelvis was performed using the standard protocol following bolus administration of intravenous contrast. CONTRAST:  132mL ISOVUE-300 IOPAMIDOL (ISOVUE-300) INJECTION 61% COMPARISON:  CTA 06/08/2012 FINDINGS: Lower chest: Dependent atelectasis in both lower lobes. No pleural fluid. Atherosclerosis of the descending thoracic aorta. Hepatobiliary: No focal liver abnormality is seen. Status post cholecystectomy. No biliary dilatation. Pancreas: Unremarkable. No pancreatic ductal dilatation or surrounding inflammatory changes. Spleen: Normal in size without focal abnormality. Adrenals/Urinary Tract: No adrenal nodule. No hydronephrosis or perinephric edema. Partially duplicated right renal collecting system. 2.5 cm simple cyst in the posterior mid left kidney. Urinary bladder is physiologically distended. Stomach/Bowel: Multifocal colonic diverticulosis, prominent in the descending and sigmoid colon. Scattered areas of mural wall thickening in the mid sigmoid without pericolonic edema. Normal appendix. Small bowel is decompressed. Stomach is physiologically distended. Vascular/Lymphatic: Dense aortic  atherosclerosis. No aneurysm. Circumaortic left renal vein. No acute vascular abnormality. No adenopathy. Reproductive: Status post hysterectomy. No adnexal masses. Other: No free air, free fluid, or intra-abdominal fluid collection. Tiny fat containing umbilical hernia. Musculoskeletal: Degenerative change in the lumbar spine and both hips. There are no acute or suspicious osseous abnormalities. IMPRESSION: 1. Colonic diverticulosis. Mild mural wall thickening in the mid sigmoid, likely chronic, cannot exclude mild acute diverticulitis, particularly if there is lower abdominal pain. 2. No additional acute abnormality. 3. Aortic atherosclerosis without aneurysm. Electronically Signed   By: Jeb Levering M.D.   On: 10/04/2016 23:11     Subjective:  Discharge Exam: Vitals:   10/06/16 2057 10/06/16 2100 10/06/16 2300 10/07/16 0459  BP: (!) 156/100 (!) 182/67 (!) 166/63 (!) 160/85  Pulse: 65 64 62 68  Resp: 20   18  Temp: 98.5 F (36.9 C)   98.4 F (36.9 C)  TempSrc: Oral   Oral  SpO2: 98%   95%  Weight:    68.4 kg (150 lb 14.4 oz)  Height:       General:  Pt is alert, awake, not in acute distress Cardiovascular: RRR, S1/S2 +, no rubs, no gallops Respiratory: CTA bilaterally, no wheezing, no rhonchi Abdominal: Soft, NT, ND, bowel sounds + Extremities: no edema, no cyanosis   The results of significant diagnostics from this hospitalization (including imaging, microbiology, ancillary and laboratory) are listed below for reference.    Microbiology: Recent Results (from the past 240 hour(s))  Urine culture     Status: Abnormal   Collection Time: 10/04/16  6:25 PM  Result Value Ref Range Status   Specimen Description URINE, RANDOM  Final   Special Requests NONE  Final   Culture MULTIPLE SPECIES PRESENT, SUGGEST RECOLLECTION (A)  Final   Report Status 10/06/2016 FINAL  Final     Labs: BNP (last 3 results) No results for input(s): BNP in the last 8760 hours. Basic Metabolic  Panel:  Recent Labs Lab 10/04/16 1707 10/05/16 0226 10/06/16 0929 10/07/16 0538  NA 135 133* 137 138  K 4.2 3.7 3.6 4.2  CL 99* 98* 103 104  CO2 27 26 25 25   GLUCOSE 156* 163* 185* 103*  BUN 15 11 11 10   CREATININE 0.83 0.67 0.74 0.65  CALCIUM 9.3 9.2 9.1 8.7*  MG  --  2.0  --   --   PHOS  --  3.3  --   --    Liver Function Tests:  Recent Labs Lab 10/04/16 1707 10/05/16 0226 10/06/16 0929 10/07/16 0538  AST 454* 625* 162* 64*  ALT 226* 526* 306* 196*  ALKPHOS 114 151* 141* 119  BILITOT 2.1* 2.2* 0.9 0.6  PROT 6.9 6.3* 6.7 6.1*  ALBUMIN 3.9 3.5 3.6 3.2*    Recent Labs Lab 10/05/16 0226  LIPASE 28   No results for input(s): AMMONIA in the last 168 hours. CBC:  Recent Labs Lab 10/04/16 1707 10/05/16 0226  WBC 18.1* 14.1*  NEUTROABS 16.3* 12.3*  HGB 13.1 12.4  HCT 39.8 37.6  MCV 91.7 91.0  PLT 198 213   Cardiac Enzymes:  Recent Labs Lab 10/04/16 2233 10/04/16 2315 10/05/16 0958  TROPONINI <0.03 <0.03 <0.03   BNP: Invalid input(s): POCBNP CBG: No results for input(s): GLUCAP in the last 168 hours. D-Dimer No results for input(s): DDIMER in the last 72 hours. Hgb A1c  Recent Labs  10/05/16 0226  HGBA1C 5.5   Lipid Profile No results for input(s): CHOL, HDL, LDLCALC, TRIG, CHOLHDL, LDLDIRECT in the last 72 hours. Thyroid function studies  Recent Labs  10/05/16 0226  TSH 0.807   Anemia work up No results for input(s): VITAMINB12, FOLATE, FERRITIN, TIBC, IRON, RETICCTPCT in the last 72 hours. Urinalysis    Component Value Date/Time   COLORURINE AMBER (A) 10/04/2016 1825   APPEARANCEUR HAZY (A) 10/04/2016 1825   LABSPEC 1.014 10/04/2016 1825   PHURINE 5.0 10/04/2016 1825   GLUCOSEU NEGATIVE 10/04/2016 1825   HGBUR NEGATIVE 10/04/2016 1825   BILIRUBINUR NEGATIVE 10/04/2016 1825   BILIRUBINUR 2+ 11/09/2014 1429   KETONESUR 5 (A) 10/04/2016 1825   PROTEINUR 100 (A) 10/04/2016 1825   UROBILINOGEN negative 11/09/2014 1429    UROBILINOGEN 0.2 01/12/2013 1618   NITRITE NEGATIVE 10/04/2016 1825   LEUKOCYTESUR TRACE (A) 10/04/2016 1825   Sepsis Labs Invalid input(s): PROCALCITONIN,  WBC,  LACTICIDVEN Microbiology Recent Results (from the past 240 hour(s))  Urine culture     Status: Abnormal   Collection Time: 10/04/16  6:25 PM  Result Value Ref Range Status   Specimen Description URINE, RANDOM  Final   Special Requests  NONE  Final   Culture MULTIPLE SPECIES PRESENT, SUGGEST RECOLLECTION (A)  Final   Report Status 10/06/2016 FINAL  Final     Time coordinating discharge: Over 30 minutes  SIGNED:   Birdie Hopes, MD  [facility] 10/07/2016, 9:25 AM Pager   If 7PM-7AM, please contact night-coverage [email or web address] Password [email or web address]

## 2016-10-10 ENCOUNTER — Telehealth: Payer: Self-pay

## 2016-10-10 NOTE — Telephone Encounter (Signed)
Marissa Bowen called and left a message about pain she was having in her rectal area. Patient stated she was in hospital for 4 days and may have had a bladder infection. She was prescribed Cipro. She completed course for medication, but since she has had pain and burning in rectal area. She wanted to see if provider could recommend a treatment to help with pain.  Spoke with Sherrie Mustache, NP, she recommended patient should try a barrier cream such as Desitin.   Called patient back and advised her of what the provider recommended. Patient agreed.

## 2016-10-12 ENCOUNTER — Other Ambulatory Visit: Payer: Self-pay | Admitting: Internal Medicine

## 2016-10-16 ENCOUNTER — Encounter: Payer: Self-pay | Admitting: Internal Medicine

## 2016-10-16 ENCOUNTER — Ambulatory Visit (INDEPENDENT_AMBULATORY_CARE_PROVIDER_SITE_OTHER): Payer: Medicare Other | Admitting: Internal Medicine

## 2016-10-16 VITALS — BP 128/84 | HR 75 | Temp 98.1°F | Resp 17 | Ht 68.0 in | Wt 151.6 lb

## 2016-10-16 DIAGNOSIS — R740 Nonspecific elevation of levels of transaminase and lactic acid dehydrogenase [LDH]: Secondary | ICD-10-CM

## 2016-10-16 DIAGNOSIS — I480 Paroxysmal atrial fibrillation: Secondary | ICD-10-CM | POA: Diagnosis not present

## 2016-10-16 DIAGNOSIS — I8393 Asymptomatic varicose veins of bilateral lower extremities: Secondary | ICD-10-CM | POA: Diagnosis not present

## 2016-10-16 DIAGNOSIS — I2510 Atherosclerotic heart disease of native coronary artery without angina pectoris: Secondary | ICD-10-CM

## 2016-10-16 DIAGNOSIS — K219 Gastro-esophageal reflux disease without esophagitis: Secondary | ICD-10-CM | POA: Diagnosis not present

## 2016-10-16 DIAGNOSIS — Z7901 Long term (current) use of anticoagulants: Secondary | ICD-10-CM | POA: Diagnosis not present

## 2016-10-16 DIAGNOSIS — F418 Other specified anxiety disorders: Secondary | ICD-10-CM

## 2016-10-16 DIAGNOSIS — R7401 Elevation of levels of liver transaminase levels: Secondary | ICD-10-CM

## 2016-10-16 DIAGNOSIS — I872 Venous insufficiency (chronic) (peripheral): Secondary | ICD-10-CM

## 2016-10-16 DIAGNOSIS — I10 Essential (primary) hypertension: Secondary | ICD-10-CM | POA: Diagnosis not present

## 2016-10-16 DIAGNOSIS — E440 Moderate protein-calorie malnutrition: Secondary | ICD-10-CM

## 2016-10-16 LAB — COMPLETE METABOLIC PANEL WITH GFR
ALT: 25 U/L (ref 6–29)
AST: 16 U/L (ref 10–35)
Albumin: 4.1 g/dL (ref 3.6–5.1)
Alkaline Phosphatase: 91 U/L (ref 33–130)
BUN: 13 mg/dL (ref 7–25)
CALCIUM: 9.2 mg/dL (ref 8.6–10.4)
CHLORIDE: 101 mmol/L (ref 98–110)
CO2: 26 mmol/L (ref 20–31)
CREATININE: 0.73 mg/dL (ref 0.60–0.88)
GFR, EST AFRICAN AMERICAN: 86 mL/min (ref >=60)
GFR, Est Non African American: 74 mL/min (ref >=60)
Glucose, Bld: 113 mg/dL — ABNORMAL HIGH (ref 65–99)
POTASSIUM: 4.1 mmol/L (ref 3.5–5.3)
Sodium: 138 mmol/L (ref 135–146)
Total Bilirubin: 0.4 mg/dL (ref 0.2–1.2)
Total Protein: 6.7 g/dL (ref 6.1–8.1)

## 2016-10-16 MED ORDER — DULOXETINE HCL 30 MG PO CPEP: 30.0000 mg | ORAL_CAPSULE | Freq: Every day | ORAL | 6 refills | Status: DC
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
## 2016-10-16 NOTE — Patient Instructions (Addendum)
Continue current medications as ordered  Will call with referral to vascular surgery for varicose veins  Will call with lab results  Follow up with GYN to discuss re-insertion of pessary vs other treatments for bladder prolapse  Recommend increase meals to 3 daily with snacks in between  Restart cymbalta and take 1 capsule daily  Follow up in 1 month for anxiety/depression

## 2016-10-18 ENCOUNTER — Encounter: Payer: Self-pay | Admitting: *Deleted

## 2016-10-29 ENCOUNTER — Other Ambulatory Visit: Payer: Self-pay | Admitting: Internal Medicine

## 2016-10-31 ENCOUNTER — Other Ambulatory Visit: Payer: Self-pay | Admitting: Internal Medicine

## 2016-11-01 ENCOUNTER — Ambulatory Visit: Payer: Medicare Other | Admitting: Internal Medicine

## 2016-11-05 ENCOUNTER — Other Ambulatory Visit: Payer: Self-pay

## 2016-11-05 DIAGNOSIS — I83893 Varicose veins of bilateral lower extremities with other complications: Secondary | ICD-10-CM

## 2016-11-13 ENCOUNTER — Other Ambulatory Visit: Payer: Self-pay | Admitting: Nurse Practitioner

## 2016-11-13 DIAGNOSIS — M159 Polyosteoarthritis, unspecified: Secondary | ICD-10-CM

## 2016-11-13 DIAGNOSIS — M150 Primary generalized (osteo)arthritis: Secondary | ICD-10-CM

## 2016-11-13 DIAGNOSIS — G8929 Other chronic pain: Secondary | ICD-10-CM

## 2016-11-13 DIAGNOSIS — M25561 Pain in right knee: Principal | ICD-10-CM

## 2016-11-18 ENCOUNTER — Other Ambulatory Visit: Payer: Self-pay | Admitting: Internal Medicine

## 2016-11-19 ENCOUNTER — Telehealth: Payer: Self-pay

## 2016-11-19 ENCOUNTER — Ambulatory Visit (INDEPENDENT_AMBULATORY_CARE_PROVIDER_SITE_OTHER): Payer: Medicare Other | Admitting: Internal Medicine

## 2016-11-19 ENCOUNTER — Encounter: Payer: Self-pay | Admitting: Internal Medicine

## 2016-11-19 VITALS — BP 128/68 | HR 69 | Temp 98.2°F | Ht 68.0 in | Wt 152.2 lb

## 2016-11-19 DIAGNOSIS — F411 Generalized anxiety disorder: Secondary | ICD-10-CM | POA: Insufficient documentation

## 2016-11-19 DIAGNOSIS — M6281 Muscle weakness (generalized): Secondary | ICD-10-CM

## 2016-11-19 DIAGNOSIS — F332 Major depressive disorder, recurrent severe without psychotic features: Secondary | ICD-10-CM

## 2016-11-19 DIAGNOSIS — R740 Nonspecific elevation of levels of transaminase and lactic acid dehydrogenase [LDH]: Secondary | ICD-10-CM | POA: Diagnosis not present

## 2016-11-19 DIAGNOSIS — I2510 Atherosclerotic heart disease of native coronary artery without angina pectoris: Secondary | ICD-10-CM | POA: Diagnosis not present

## 2016-11-19 DIAGNOSIS — R7401 Elevation of levels of liver transaminase levels: Secondary | ICD-10-CM

## 2016-11-19 MED ORDER — DULOXETINE HCL 60 MG PO CPEP: 60.0000 mg | ORAL_CAPSULE | Freq: Every day | ORAL | 2 refills | Status: DC

## 2016-11-19 NOTE — Telephone Encounter (Signed)
Patient called to follow-up from office visit today, patient would like to know what Dr.Carter meant by Rehab, patient was not clear when she left appointment.   I reviewed OV note/AVS, I informed patient that Dr.Carter recommended physical therapy to improve strength. Patient asked if this would have to be done at a nursing home for she did not wish to go to a [facility]. I informed patient that would be outpatient.  Patient states she would like PT yet she would not have a way to get there for she is not confident in her driving and does not have anyone to assist her.   I offered referral to [facility] for transportation assistance and patient agreed to referral, patient states once she has transportation assistance she would like to consider Physical Therapy   Please completed pending [facility] referral

## 2016-11-19 NOTE — Patient Instructions (Signed)
INCREASE CYMBALTA TO 60MG  daily (take 2 caps 30mg  to =60mg  until bottle empty then start new bottle from pharmacy)  Recommend PHYSICAL THERAPY to improve strength  Follow up in 1 month for anxiety and depression
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
## 2016-11-20 NOTE — Telephone Encounter (Signed)
Referral complete.

## 2016-11-21 ENCOUNTER — Telehealth: Payer: Self-pay | Admitting: *Deleted

## 2016-11-21 NOTE — Telephone Encounter (Signed)
Patient notified and agreed.  

## 2016-11-21 NOTE — Telephone Encounter (Signed)
She needs to continue cymbalta as ordered. Recommend switch med to before bedtime. Fatigue will get better the longer she takes medication.

## 2016-11-21 NOTE — Telephone Encounter (Signed)
Patient called and stated that since increasing the Cymbalta it has slowed her way down. Stated that if she sits down she falls asleep. Stated that she doesn't want to get up in the mornings. Patient stated that she lives by herself and she is scared to take the medication. Please Advise.

## 2016-11-30 ENCOUNTER — Other Ambulatory Visit: Payer: Self-pay | Admitting: Internal Medicine

## 2016-12-05 ENCOUNTER — Other Ambulatory Visit: Payer: Self-pay | Admitting: Internal Medicine

## 2016-12-05 ENCOUNTER — Telehealth: Payer: Self-pay | Admitting: *Deleted

## 2016-12-05 NOTE — Telephone Encounter (Signed)
Dr. Eulas Post filled out SC[facility] Forms for patient. Faxed to [facility]. Sent [facility] for scanning.

## 2016-12-25 ENCOUNTER — Encounter: Payer: Self-pay | Admitting: Internal Medicine

## 2016-12-25 ENCOUNTER — Ambulatory Visit (INDEPENDENT_AMBULATORY_CARE_PROVIDER_SITE_OTHER): Payer: Medicare Other | Admitting: Internal Medicine

## 2016-12-25 VITALS — BP 124/78 | HR 65 | Temp 97.6°F | Ht 68.0 in | Wt 152.0 lb

## 2016-12-25 DIAGNOSIS — F332 Major depressive disorder, recurrent severe without psychotic features: Secondary | ICD-10-CM

## 2016-12-25 DIAGNOSIS — I2510 Atherosclerotic heart disease of native coronary artery without angina pectoris: Secondary | ICD-10-CM | POA: Diagnosis not present

## 2016-12-25 DIAGNOSIS — F411 Generalized anxiety disorder: Secondary | ICD-10-CM | POA: Diagnosis not present

## 2016-12-25 MED ORDER — LORAZEPAM 1 MG PO TABS: 1.0000 mg | ORAL_TABLET | Freq: Three times a day (TID) | ORAL | 0 refills | Status: DC | PRN

## 2016-12-25 NOTE — Progress Notes (Signed)
Patient ID: Marissa Bowen, female   DOB: Feb 04, 1927, 83 y.o.   MRN: [id number]    Location:  [facility] Place of Service: OFFICE  Chief Complaint  Patient presents with  . Medical Management of Chronic Issues    1 month follow-up on anxiety and depression, patient states she has several concerns to discuss with Dr.Dekota Shenk, patient overall does not feel good  . Medication Refill    No refills needed today   . Immunizations    Patient will wait until next visit for flu vaccine     HPI:  83 yo female seen today for f/u anxiety and depression. She continues to urinate q15 minutes. She has prolapsed bladder. She had a pessary in the past. She is scheduled to see GYN next month and will d/w them further. She has good and bad days for depression. She admits to poor compliance with cymbalta 64m. She had next day fatigue when she initially started medication. She also takes prn ativan. No SI/HI. She takes PPI BID. Zolpidem helps sleep.  Depression/grief reaction - uncontrolled. has tried 4 meds (lexapro, zoloft, remeron and trazodone) in the past and unable to tolerate ADRs. No anaphylaxis to meds tried. She misses her deceased spouse. Some short term memory issues. Takes prn lorazepam and prn ambien. She has difficulty staying asleep.   She is a poor historian due to psych d/o. Hx obtained from chart  Past Medical History:  Diagnosis Date  . Anemia   . Anxiety   . BACK PAIN, LUMBAR 09/27/2009  . CAD (coronary artery disease)    a. s/p remote CABG 1997, 3V.;  b. LexiScan Myoview (8/15): No ischemia, EF 72%, normal study;  c. Echo 12/13 - EF 60-65%, no significant valvular abnormalities, normal RV size and systolic function.   . CVA (cerebral infarction)    a. Thalamic infarct 09/2009. //  b. hx of TIA in 2008  . Gastric ulcer   . GERD 11/25/2006  . GI bleed    a. 09/2009: secondary to antral ulcer.  . Hiatal hernia   . History of diverticulitis Dx 02/2012  . History of MI (myocardial infarction)     . HYPERLIPIDEMIA   . HYPERTENSION   . IBS 12/07/2008  . MIGRAINE WITH AURA 08/13/2007  . Mild carotid artery disease ([facility]    a. 0-39% bilat 05/2011 (f/u recommended 05/2013). //  b. Carotid [place] - Bilat ICA 1-39% >> FU prn  . Muscle weakness (generalized) 09/27/2009  . OSTEOARTHRITIS 08/13/2007  . PAF (paroxysmal atrial fibrillation) ([facility] 11/25/2006   a. Multaq >> changes to Amiodarone 2/2 $$  //  Eliquis (CHADS2-VASc = 6)  . Urinary, incontinence, stress female    wears Pessary    Past Surgical History:  Procedure Laterality Date  . ABDOMINAL HYSTERECTOMY  1973  . CATARACT EXTRACTION W/ INTRAOCULAR LENS  IMPLANT, BILATERAL Bilateral 1999  . CHOLECYSTECTOMY N/A 01/03/2015   Procedure: LAPAROSCOPIC CHOLECYSTECTOMY WITH INTRAOPERATIVE CHOLANGIOGRAM;  Surgeon: MDonnie Mesa MD;  Location: [facility]  Service: General;  Laterality: N/A;  . CORONARY ANGIOPLASTY  1997   Dr SLia Foyer . CORONARY ARTERY BYPASS GRAFT  1997   Dr VMelvenia Needles . LAPAROSCOPIC CHOLECYSTECTOMY  01/03/2015  . TONSILLECTOMY      Patient Care Team: CGildardo Cranker DO as PCP - General (Internal Medicine) SHillary Bow MD (Cardiology) BMelissa Montane MD as Consulting Physician (Otolaryngology) MArvella Nigh MD as Consulting Physician (Obstetrics and Gynecology) SRutherford Guys MD as Consulting Physician (Ophthalmology)  Social History  Social History  . Marital status: Widowed    Spouse name: N/A  . Number of children: N/A  . Years of education: N/A   Occupational History  . Not on file.   Social History Main Topics  . Smoking status: Never Smoker  . Smokeless tobacco: Never Used  . Alcohol use No  . Drug use: No  . Sexual activity: No   Other Topics Concern  . Not on file   Social History Narrative  . No narrative on file     reports that she has never smoked. She has never used smokeless tobacco. She reports that she does not drink alcohol or use drugs.  Family History  Problem Relation Age of Onset   . Heart attack Mother   . Heart failure Father   . Throat cancer Sister   . Heart disease Sister   . Coronary artery disease Sister   . Diabetes Sister   . Heart attack Sister   . Colon cancer Neg Hx    Family Status  Relation Status  . Mother Deceased  . Father Deceased  . Sister Deceased  . Sister Deceased  . Sister Deceased  . Brother Deceased  . Daughter Alive  . Son Alive  . Sister Alive  . Daughter Alive  . MGM Deceased  . MGF Deceased  . PGM Deceased  . PGF Deceased  . Neg Hx (Not Specified)     Allergies  Allergen Reactions  . Asa [Aspirin] Other (See Comments)    Bleeding ulcer  . Crestor [Rosuvastatin Calcium] Other (See Comments)    myalgia  . Hydrocodone Other (See Comments)    REACTION: migraines  . Ibuprofen Other (See Comments)    ulcers  . Oxycodone Hcl Other (See Comments)    REACTION: migraines  . Penicillins Swelling and Rash    Has patient had a PCN reaction causing immediate rash, facial/tongue/throat swelling, SOB or lightheadedness with hypotension: Yes Has patient had a PCN reaction causing severe rash involving mucus membranes or skin necrosis: No Has patient had a PCN reaction that required hospitalization No Has patient had a PCN reaction occurring within the last 10 years: No If all of the above answers are "NO", then may proceed with Cephalosporin use.   . Prednisone Other (See Comments)    ulcers  . Statins Other (See Comments)    Aches and pains  . Morphine And Related Other (See Comments)     Had hallucinations with morphine sulfate in the past    . Pristiq [Desvenlafaxine] Other (See Comments)    Drowsiness and hangover sensation    Medications: Patient's Medications  New Prescriptions   No medications on file  Previous Medications   ACETAMINOPHEN (TYLENOL) 500 MG TABLET    Take 500-1,000 mg by mouth every 6 (six) hours as needed for moderate pain.    AMIODARONE (PACERONE) 200 MG TABLET    TAKE ONE-HALF TABLET DAILY    DULOXETINE (CYMBALTA) 60 MG CAPSULE    Take 1 capsule (60 mg total) by mouth daily.   ELIQUIS 5 MG TABS TABLET    Take 1 tablet (5 mg total) by mouth 2 (two) times daily.   LORAZEPAM (ATIVAN) 1 MG TABLET    TAKE ONE TABLET EVERY EIGHT HOURS AS NEEDED FOR ANXIETY   LOSARTAN (COZAAR) 50 MG TABLET    TAKE ONE TABLET TWICE DAILY   MECLIZINE (ANTIVERT) 12.5 MG TABLET    TAKE 1 TO 2 TABLETS EVERY 8 HOURS AS NEEDED  FOR DIZZINESS   MULTIPLE VITAMIN (MULTIVITAMIN WITH MINERALS) TABS TABLET    Take 1 tablet by mouth daily.   MULTIPLE VITAMINS-MINERALS (PRESERVISION AREDS) TABS    Take 1 tablet by mouth 2 (two) times daily.    NIACIN 500 MG CR CAPSULE    Take 500 mg by mouth 2 (two) times daily.    PANTOPRAZOLE (PROTONIX) 40 MG TABLET    TAKE ONE TABLET TWICE DAILY   SODIUM CHLORIDE (OCEAN) 0.65 % SOLN NASAL SPRAY    Place 1 spray into both nostrils at bedtime.    TETRAHYDROZOLINE HCL (VISINE OP)    Place 2 drops into both eyes 2 (two) times daily as needed (for dry eyes).    TRAMADOL (ULTRAM) 50 MG TABLET    ONE TABLET EVERY EIGHT HOURS AS NEEDED FOR MODERATE TO SEVERE PAIN   TRIAMCINOLONE CREAM (KENALOG) 0.1 %    Apply 1 application topically 2 (two) times daily as needed (dry skin).   ZOLPIDEM (AMBIEN) 10 MG TABLET    TAKE 1 TABLET AT BEDTIME AS NEEDED FOR SLEEP  Modified Medications   No medications on file  Discontinued Medications   No medications on file    Review of Systems  Unable to perform ROS: Psychiatric disorder (depression)    Vitals:   12/25/16 1347  BP: 124/78  Pulse: 65  Temp: 97.6 F (36.4 C)  TempSrc: Oral  SpO2: 96%  Weight: 152 lb (68.9 kg)  Height: 5' 8"  (1.727 m)   Body mass index is 23.11 kg/m.  Physical Exam  Constitutional: She is oriented to person, place, and time. She appears well-developed and well-nourished.  Musculoskeletal: She exhibits edema.  Neurological: She is alert and oriented to person, place, and time.  Skin: Skin is warm and dry. No rash  noted.  Psychiatric: Her behavior is normal. She exhibits a depressed mood.     Labs reviewed: Office Visit on 10/16/2016  Component Date Value Ref Range Status  . Sodium 10/16/2016 138  135 - 146 mmol/L Final  . Potassium 10/16/2016 4.1  3.5 - 5.3 mmol/L Final  . Chloride 10/16/2016 101  98 - 110 mmol/L Final  . CO2 10/16/2016 26  20 - 31 mmol/L Final  . Glucose, Bld 10/16/2016 113* 65 - 99 mg/dL Final  . BUN 10/16/2016 13  7 - 25 mg/dL Final  . Creat 10/16/2016 0.73  0.60 - 0.88 mg/dL Final   Comment:   For patients > or = 81 years of age: The upper reference limit for Creatinine is approximately 13% higher for people identified as African-American.     . Total Bilirubin 10/16/2016 0.4  0.2 - 1.2 mg/dL Final  . Alkaline Phosphatase 10/16/2016 91  33 - 130 U/L Final  . AST 10/16/2016 16  10 - 35 U/L Final  . ALT 10/16/2016 25  6 - 29 U/L Final  . Total Protein 10/16/2016 6.7  6.1 - 8.1 g/dL Final  . Albumin 10/16/2016 4.1  3.6 - 5.1 g/dL Final  . Calcium 10/16/2016 9.2  8.6 - 10.4 mg/dL Final  . GFR, Est African American 10/16/2016 86  >=60 mL/min Final  . GFR, Est Non African American 10/16/2016 74  >=60 mL/min Final  Admission on 10/04/2016, Discharged on 10/07/2016  Component Date Value Ref Range Status  . Troponin i, poc 10/04/2016 0.01  0.00 - 0.08 ng/mL Final  . Comment 3 10/04/2016          Final   Comment: Due to the release  kinetics of cTnI, a negative result within the first hours of the onset of symptoms does not rule out myocardial infarction with certainty. If myocardial infarction is still suspected, repeat the test at appropriate intervals.   . Color, Urine 10/04/2016 AMBER* YELLOW Final   BIOCHEMICALS MAY BE AFFECTED BY COLOR  . APPearance 10/04/2016 HAZY* CLEAR Final  . Specific Gravity, Urine 10/04/2016 1.014  1.005 - 1.030 Final  . pH 10/04/2016 5.0  5.0 - 8.0 Final  . Glucose, UA 10/04/2016 NEGATIVE  NEGATIVE mg/dL Final  . Hgb urine dipstick  10/04/2016 NEGATIVE  NEGATIVE Final  . Bilirubin Urine 10/04/2016 NEGATIVE  NEGATIVE Final  . Ketones, ur 10/04/2016 5* NEGATIVE mg/dL Final  . Protein, ur 10/04/2016 100* NEGATIVE mg/dL Final  . Nitrite 10/04/2016 NEGATIVE  NEGATIVE Final  . Leukocytes, UA 10/04/2016 TRACE* NEGATIVE Final  . RBC / HPF 10/04/2016 0-5  0 - 5 RBC/hpf Final  . WBC, UA 10/04/2016 6-30  0 - 5 WBC/hpf Final  . Bacteria, UA 10/04/2016 RARE* NONE SEEN Final  . Squamous Epithelial / LPF 10/04/2016 0-5* NONE SEEN Final  . Mucus 10/04/2016 PRESENT   Final  . Hyaline Casts, UA 10/04/2016 PRESENT   Final  . Granular Casts, UA 10/04/2016 PRESENT   Final  . Amorphous Crystal 10/04/2016 PRESENT   Final  . Non Squamous Epithelial 10/04/2016 0-5* NONE SEEN Final  . Specimen Description 10/04/2016 URINE, RANDOM   Final  . Special Requests 10/04/2016 NONE   Final  . Culture 10/04/2016 MULTIPLE SPECIES PRESENT, SUGGEST RECOLLECTION*  Final  . Report Status 10/04/2016 10/06/2016 FINAL   Final  . Sodium 10/04/2016 135  135 - 145 mmol/L Final  . Potassium 10/04/2016 4.2  3.5 - 5.1 mmol/L Final  . Chloride 10/04/2016 99* 101 - 111 mmol/L Final  . CO2 10/04/2016 27  22 - 32 mmol/L Final  . Glucose, Bld 10/04/2016 156* 65 - 99 mg/dL Final  . BUN 10/04/2016 15  6 - 20 mg/dL Final  . Creatinine, Ser 10/04/2016 0.83  0.44 - 1.00 mg/dL Final  . Calcium 10/04/2016 9.3  8.9 - 10.3 mg/dL Final  . Total Protein 10/04/2016 6.9  6.5 - 8.1 g/dL Final  . Albumin 10/04/2016 3.9  3.5 - 5.0 g/dL Final  . AST 10/04/2016 454* 15 - 41 U/L Final  . ALT 10/04/2016 226* 14 - 54 U/L Final  . Alkaline Phosphatase 10/04/2016 114  38 - 126 U/L Final  . Total Bilirubin 10/04/2016 2.1* 0.3 - 1.2 mg/dL Final  . GFR calc non Af Amer 10/04/2016 >60  >60 mL/min Final  . GFR calc Af Amer 10/04/2016 >60  >60 mL/min Final   Comment: (NOTE) The eGFR has been calculated using the CKD EPI equation. This calculation has not been validated in all clinical  situations. eGFR's persistently <60 mL/min signify possible Chronic Kidney Disease.   . Anion gap 10/04/2016 9  5 - 15 Final  . WBC 10/04/2016 18.1* 4.0 - 10.5 K/uL Final  . RBC 10/04/2016 4.34  3.87 - 5.11 MIL/uL Final  . Hemoglobin 10/04/2016 13.1  12.0 - 15.0 g/dL Final  . HCT 10/04/2016 39.8  36.0 - 46.0 % Final  . MCV 10/04/2016 91.7  78.0 - 100.0 fL Final  . MCH 10/04/2016 30.2  26.0 - 34.0 pg Final  . MCHC 10/04/2016 32.9  30.0 - 36.0 g/dL Final  . RDW 10/04/2016 13.3  11.5 - 15.5 % Final  . Platelets 10/04/2016 198  150 - 400 K/uL Final  .  Neutrophils Relative % 10/04/2016 91  % Final  . Neutro Abs 10/04/2016 16.3* 1.7 - 7.7 K/uL Final  . Lymphocytes Relative 10/04/2016 4  % Final  . Lymphs Abs 10/04/2016 0.8  0.7 - 4.0 K/uL Final  . Monocytes Relative 10/04/2016 5  % Final  . Monocytes Absolute 10/04/2016 0.9  0.1 - 1.0 K/uL Final  . Eosinophils Relative 10/04/2016 0  % Final  . Eosinophils Absolute 10/04/2016 0.1  0.0 - 0.7 K/uL Final  . Basophils Relative 10/04/2016 0  % Final  . Basophils Absolute 10/04/2016 0.0  0.0 - 0.1 K/uL Final  . Lactic Acid, Venous 10/04/2016 1.65  0.5 - 1.9 mmol/L Final  . Hepatitis B Surface Ag 10/04/2016 Negative  Negative Final  . HCV Ab 10/04/2016 <0.1  0.0 - 0.9 s/co ratio Final   Comment: (NOTE)                                  Negative:     < 0.8                             Indeterminate: 0.8 - 0.9                                  Positive:     > 0.9 The CDC recommends that a positive HCV antibody result be followed up with a HCV Nucleic Acid Amplification test ([id number]). Performed At: [facility] [place], [place] [id number] Lindon Romp MD [id number]   . Hep A IgM 10/04/2016 Negative  Negative Final  . Hep B C IgM 10/04/2016 Positive* Negative Final  . Acetaminophen (Tylenol), Serum 10/04/2016 <10* 10 - 30 ug/mL Final   Comment:        THERAPEUTIC CONCENTRATIONS VARY SIGNIFICANTLY. A RANGE OF  10-30 ug/mL MAY BE AN EFFECTIVE CONCENTRATION FOR MANY PATIENTS. HOWEVER, SOME ARE BEST TREATED AT CONCENTRATIONS OUTSIDE THIS RANGE. ACETAMINOPHEN CONCENTRATIONS >150 ug/mL AT 4 HOURS AFTER INGESTION AND >50 ug/mL AT 12 HOURS AFTER INGESTION ARE OFTEN ASSOCIATED WITH TOXIC REACTIONS.   . Troponin I 10/04/2016 <0.03  <0.03 ng/mL Final  . Troponin I 10/04/2016 <0.03  <0.03 ng/mL Final  . Troponin I 10/05/2016 <0.03  <0.03 ng/mL Final  . Prothrombin Time 10/04/2016 14.6  11.4 - 15.2 seconds Final  . INR 10/04/2016 1.14   Final  . Magnesium 10/05/2016 2.0  1.7 - 2.4 mg/dL Final  . Phosphorus 10/05/2016 3.3  2.5 - 4.6 mg/dL Final  . TSH 10/05/2016 0.807  0.350 - 4.500 uIU/mL Final   Performed by a 3rd Generation assay with a functional sensitivity of <=0.01 uIU/mL.  Marland Kitchen Sodium 10/05/2016 133* 135 - 145 mmol/L Final  . Potassium 10/05/2016 3.7  3.5 - 5.1 mmol/L Final  . Chloride 10/05/2016 98* 101 - 111 mmol/L Final  . CO2 10/05/2016 26  22 - 32 mmol/L Final  . Glucose, Bld 10/05/2016 163* 65 - 99 mg/dL Final  . BUN 10/05/2016 11  6 - 20 mg/dL Final  . Creatinine, Ser 10/05/2016 0.67  0.44 - 1.00 mg/dL Final  . Calcium 10/05/2016 9.2  8.9 - 10.3 mg/dL Final  . Total Protein 10/05/2016 6.3* 6.5 - 8.1 g/dL Final  . Albumin 10/05/2016 3.5  3.5 - 5.0 g/dL Final  . AST 10/05/2016 625* 15 -  41 U/L Final  . ALT 10/05/2016 526* 14 - 54 U/L Final  . Alkaline Phosphatase 10/05/2016 151* 38 - 126 U/L Final  . Total Bilirubin 10/05/2016 2.2* 0.3 - 1.2 mg/dL Final  . GFR calc non Af Amer 10/05/2016 >60  >60 mL/min Final  . GFR calc Af Amer 10/05/2016 >60  >60 mL/min Final   Comment: (NOTE) The eGFR has been calculated using the CKD EPI equation. This calculation has not been validated in all clinical situations. eGFR's persistently <60 mL/min signify possible Chronic Kidney Disease.   . Anion gap 10/05/2016 9  5 - 15 Final  . Hgb A1c MFr Bld 10/05/2016 5.5  4.8 - 5.6 % Final   Comment:  (NOTE)         Pre-diabetes: 5.7 - 6.4         Diabetes: >6.4         Glycemic control for adults with diabetes: <7.0   . Mean Plasma Glucose 10/05/2016 111  mg/dL Final   Comment: (NOTE) Performed At: [facility] [place], [place] [id number] Lindon Romp MD [id number]   . Weight 10/05/2016 2393.6  oz Final  . Height 10/05/2016 68  in Final  . BP 10/05/2016 110/77  mmHg Final  . WBC 10/05/2016 14.1* 4.0 - 10.5 K/uL Final  . RBC 10/05/2016 4.13  3.87 - 5.11 MIL/uL Final  . Hemoglobin 10/05/2016 12.4  12.0 - 15.0 g/dL Final  . HCT 10/05/2016 37.6  36.0 - 46.0 % Final  . MCV 10/05/2016 91.0  78.0 - 100.0 fL Final  . MCH 10/05/2016 30.0  26.0 - 34.0 pg Final  . MCHC 10/05/2016 33.0  30.0 - 36.0 g/dL Final  . RDW 10/05/2016 13.3  11.5 - 15.5 % Final  . Platelets 10/05/2016 213  150 - 400 K/uL Final  . Neutrophils Relative % 10/05/2016 87  % Final  . Neutro Abs 10/05/2016 12.3* 1.7 - 7.7 K/uL Final  . Lymphocytes Relative 10/05/2016 5  % Final  . Lymphs Abs 10/05/2016 0.7  0.7 - 4.0 K/uL Final  . Monocytes Relative 10/05/2016 7  % Final  . Monocytes Absolute 10/05/2016 0.9  0.1 - 1.0 K/uL Final  . Eosinophils Relative 10/05/2016 1  % Final  . Eosinophils Absolute 10/05/2016 0.2  0.0 - 0.7 K/uL Final  . Basophils Relative 10/05/2016 0  % Final  . Basophils Absolute 10/05/2016 0.0  0.0 - 0.1 K/uL Final  . Lipase 10/05/2016 28  11 - 51 U/L Final  . LDH 10/05/2016 528* 98 - 192 U/L Final  . Sodium 10/06/2016 137  135 - 145 mmol/L Final  . Potassium 10/06/2016 3.6  3.5 - 5.1 mmol/L Final  . Chloride 10/06/2016 103  101 - 111 mmol/L Final  . CO2 10/06/2016 25  22 - 32 mmol/L Final  . Glucose, Bld 10/06/2016 185* 65 - 99 mg/dL Final  . BUN 10/06/2016 11  6 - 20 mg/dL Final  . Creatinine, Ser 10/06/2016 0.74  0.44 - 1.00 mg/dL Final  . Calcium 10/06/2016 9.1  8.9 - 10.3 mg/dL Final  . Total Protein 10/06/2016 6.7  6.5 - 8.1 g/dL Final  . Albumin 10/06/2016  3.6  3.5 - 5.0 g/dL Final  . AST 10/06/2016 162* 15 - 41 U/L Final  . ALT 10/06/2016 306* 14 - 54 U/L Final  . Alkaline Phosphatase 10/06/2016 141* 38 - 126 U/L Final  . Total Bilirubin 10/06/2016 0.9  0.3 - 1.2 mg/dL Final  . GFR  calc non Af Amer 10/06/2016 >60  >60 mL/min Final  . GFR calc Af Amer 10/06/2016 >60  >60 mL/min Final   Comment: (NOTE) The eGFR has been calculated using the CKD EPI equation. This calculation has not been validated in all clinical situations. eGFR's persistently <60 mL/min signify possible Chronic Kidney Disease.   . Anion gap 10/06/2016 9  5 - 15 Final  . Sodium 10/07/2016 138  135 - 145 mmol/L Final  . Potassium 10/07/2016 4.2  3.5 - 5.1 mmol/L Final  . Chloride 10/07/2016 104  101 - 111 mmol/L Final  . CO2 10/07/2016 25  22 - 32 mmol/L Final  . Glucose, Bld 10/07/2016 103* 65 - 99 mg/dL Final  . BUN 10/07/2016 10  6 - 20 mg/dL Final  . Creatinine, Ser 10/07/2016 0.65  0.44 - 1.00 mg/dL Final  . Calcium 10/07/2016 8.7* 8.9 - 10.3 mg/dL Final  . Total Protein 10/07/2016 6.1* 6.5 - 8.1 g/dL Final  . Albumin 10/07/2016 3.2* 3.5 - 5.0 g/dL Final  . AST 10/07/2016 64* 15 - 41 U/L Final  . ALT 10/07/2016 196* 14 - 54 U/L Final  . Alkaline Phosphatase 10/07/2016 119  38 - 126 U/L Final  . Total Bilirubin 10/07/2016 0.6  0.3 - 1.2 mg/dL Final  . GFR calc non Af Amer 10/07/2016 >60  >60 mL/min Final  . GFR calc Af Amer 10/07/2016 >60  >60 mL/min Final   Comment: (NOTE) The eGFR has been calculated using the CKD EPI equation. This calculation has not been validated in all clinical situations. eGFR's persistently <60 mL/min signify possible Chronic Kidney Disease.   . Anion gap 10/07/2016 9  5 - 15 Final    No results found.   Assessment/Plan   ICD-10-CM   1. Severe episode of recurrent major depressive disorder, without psychotic features ([place]) F33.2   2. GAD (generalized anxiety disorder) F41.1 LORazepam (ATIVAN) 1 MG tablet   START CYMBALTA 60MG  DAILY for depression and anxiety  Continue other medications as ordered  Follow up with specialists as scheduled  Follow up in 1 month for MDD/GAD  Marissa Bowen  [facility] [address] [place], [place] [address] [phone] Cell (Monday-Friday 8 AM - 5 PM) [phone] After 5 PM and follow prompts

## 2016-12-25 NOTE — Patient Instructions (Signed)
START CYMBALTA 60MG  DAILY for depression and anxiety  Continue other medications as ordered  Follow up with specialists as scheduled  Follow up in 1 month for MDD/GAD

## 2016-12-28 ENCOUNTER — Other Ambulatory Visit: Payer: Self-pay | Admitting: Internal Medicine

## 2016-12-31 ENCOUNTER — Other Ambulatory Visit: Payer: Self-pay | Admitting: *Deleted

## 2016-12-31 ENCOUNTER — Other Ambulatory Visit: Payer: Self-pay | Admitting: Internal Medicine

## 2016-12-31 MED ORDER — ZOLPIDEM TARTRATE 10 MG PO TABS: 10.0000 mg | ORAL_TABLET | Freq: Every evening | ORAL | 0 refills | Status: DC | PRN

## 2016-12-31 MED ORDER — MECLIZINE HCL 12.5 MG PO TABS: ORAL_TABLET | ORAL | 0 refills | Status: DC

## 2016-12-31 NOTE — Telephone Encounter (Signed)
Brown Gardiner 

## 2016-12-31 NOTE — Telephone Encounter (Signed)
Marissa Bowen 

## 2017-01-06 DIAGNOSIS — Z1231 Encounter for screening mammogram for malignant neoplasm of breast: Secondary | ICD-10-CM | POA: Diagnosis not present

## 2017-01-06 DIAGNOSIS — Z6825 Body mass index (BMI) 25.0-25.9, adult: Secondary | ICD-10-CM | POA: Diagnosis not present

## 2017-01-06 DIAGNOSIS — Z01419 Encounter for gynecological examination (general) (routine) without abnormal findings: Secondary | ICD-10-CM | POA: Diagnosis not present

## 2017-01-06 DIAGNOSIS — N390 Urinary tract infection, site not specified: Secondary | ICD-10-CM | POA: Diagnosis not present

## 2017-01-08 ENCOUNTER — Other Ambulatory Visit: Payer: Self-pay | Admitting: Internal Medicine

## 2017-01-15 ENCOUNTER — Emergency Department (HOSPITAL_COMMUNITY)
Admission: EM | Admit: 2017-01-15 | Discharge: 2017-01-15 | Disposition: A | Discharge status: Home / Self Care | Payer: Medicare Other | Attending: Emergency Medicine | Admitting: Emergency Medicine

## 2017-01-15 ENCOUNTER — Encounter (HOSPITAL_COMMUNITY): Payer: Self-pay | Admitting: *Deleted

## 2017-01-15 ENCOUNTER — Emergency Department (HOSPITAL_COMMUNITY): Payer: Medicare Other

## 2017-01-15 DIAGNOSIS — S0181XA Laceration without foreign body of other part of head, initial encounter: Secondary | ICD-10-CM | POA: Diagnosis not present

## 2017-01-15 DIAGNOSIS — S0990XA Unspecified injury of head, initial encounter: Secondary | ICD-10-CM | POA: Insufficient documentation

## 2017-01-15 DIAGNOSIS — M79605 Pain in left leg: Secondary | ICD-10-CM | POA: Diagnosis present

## 2017-01-15 DIAGNOSIS — M542 Cervicalgia: Secondary | ICD-10-CM | POA: Diagnosis not present

## 2017-01-15 DIAGNOSIS — I6789 Other cerebrovascular disease: Secondary | ICD-10-CM | POA: Insufficient documentation

## 2017-01-15 DIAGNOSIS — M79604 Pain in right leg: Secondary | ICD-10-CM | POA: Insufficient documentation

## 2017-01-15 DIAGNOSIS — M79602 Pain in left arm: Secondary | ICD-10-CM | POA: Insufficient documentation

## 2017-01-15 DIAGNOSIS — Z8673 Personal history of transient ischemic attack (TIA), and cerebral infarction without residual deficits: Secondary | ICD-10-CM | POA: Insufficient documentation

## 2017-01-15 DIAGNOSIS — R51 Headache: Secondary | ICD-10-CM | POA: Diagnosis not present

## 2017-01-15 DIAGNOSIS — Z5321 Procedure and treatment not carried out due to patient leaving prior to being seen by health care provider: Secondary | ICD-10-CM | POA: Insufficient documentation

## 2017-01-15 DIAGNOSIS — M79601 Pain in right arm: Secondary | ICD-10-CM | POA: Diagnosis not present

## 2017-01-15 DIAGNOSIS — S098XXA Other specified injuries of head, initial encounter: Secondary | ICD-10-CM | POA: Diagnosis not present

## 2017-01-15 DIAGNOSIS — S199XXA Unspecified injury of neck, initial encounter: Secondary | ICD-10-CM | POA: Diagnosis not present

## 2017-01-15 NOTE — ED Notes (Signed)
Pt stated she is leaving she is tired of waiting.

## 2017-01-15 NOTE — ED Triage Notes (Signed)
Pt arrived by [facility] from home. Pt had a fall earlier today, was unable to get up and call for help for several hours, was scooting on the floor to get to the phone. Pt fell backwards and hit back of head. Denies loc. +blood thinners. Pts only complaint is soreness to legs and arms.

## 2017-01-20 NOTE — Progress Notes (Deleted)
Cardiology Office Note:    Date:  01/20/2017   ID:  Marissa Bowen, DOB September 30, 1926, MRN [id number]  PCP:  Gildardo Cranker, DO  Cardiologist:  Dr. Loralie Champagne  -> Marissa Bowen  Electrophysiologist:  n/a  Referring MD: Gildardo Cranker, DO   No chief complaint on file.   History of Present Illness:    Marissa Bowen is a 83 y.o. female with a hx of CAD s/p CABG in 1997, PAF, HL, HTN.  She had recurrent AF in 2015. Could not afford multaq and given amiodarone  She had to change ACE inhibitor to angiotensin receptor blocker due to cough.      CHADS2-VASc=6 (age, female, CVA, vascular dz). Creatinine clearance cannot be calculated (Patient's most recent lab result is older than the maximum 21 days allowed.)  Anticoagulation:  Apixaban (10/16/2016: Creat 0.73;  ) >> 5 mg bid  Rhythm agent - Amiodarone  She was seen in the ED 05/04/16 with symptomatic AF with RVR.  No intervention was undertaken in the ED.   She denies further rapid palpitations. She denies chest discomfort or shortness of breath. She denies syncope or near syncope. She denies orthopnea, PND or edema. She denies bleeding issues.  She has a lot of problems with balance. She also struggles with vertigo. She also has a lot of anxiety.   Metoprolol stopped last visit due to bradycardia  Seen by PA June 2018 concerned about venous insufficiency and risk of stroke Referred back To primary and VVS. ***  Prior CV studies that were reviewed today include:    Carotid US 3/17 Bilat ICA 1-39% >> FU prn  Myoview 11/30/13 Normal stress nuclear study LV Ejection Fraction: 72%. LV Wall Motion: NL LV Function; NL Wall Motion  Carotid US 06/30/13 bilat 1-39% ICA FU 2 years  Echo 04/03/12 Mild LVH, EF 60-65%, normal wall motion, grade 2 diastolic dysfunction, trivial AI, trivial MR, mild LAE, normal RV function, mild to moderate TR, PASP 37 mmHg (borderline pulmonary hypertension)  Event Monitor 11/2008 Normal   Past Medical History:    Diagnosis Date  . Anemia   . Anxiety   . BACK PAIN, LUMBAR 09/27/2009  . CAD (coronary artery disease)    a. s/p remote CABG 1997, 3V.;  b. LexiScan Myoview (8/15): No ischemia, EF 72%, normal study;  c. Echo 12/13 - EF 60-65%, no significant valvular abnormalities, normal RV size and systolic function.   . CVA (cerebral infarction)    a. Thalamic infarct 09/2009. //  b. hx of TIA in 2008  . Gastric ulcer   . GERD 11/25/2006  . GI bleed    a. 09/2009: secondary to antral ulcer.  . Hiatal hernia   . History of diverticulitis Dx 02/2012  . History of MI (myocardial infarction)   . HYPERLIPIDEMIA   . HYPERTENSION   . IBS 12/07/2008  . MIGRAINE WITH AURA 08/13/2007  . Mild carotid artery disease ([place])    a. 0-39% bilat 05/2011 (f/u recommended 05/2013). //  b. Carotid US 3/17 - Bilat ICA 1-39% >> FU prn  . Muscle weakness (generalized) 09/27/2009  . OSTEOARTHRITIS 08/13/2007  . PAF (paroxysmal atrial fibrillation) ([place]) 11/25/2006   a. Multaq >> changes to Amiodarone 2/2 $$  //  Eliquis (CHADS2-VASc = 6)  . Urinary, incontinence, stress female    wears Pessary  1. CAD: s/p CABG 1997. Echo (12/13) with EF 60-65%, no significant valvular abnormalities, normal RV size and systolic function. Lexiscan Cardiolite (8/15) with EF 72%, no  ischemia or infarction.  2. Paroxysmal atrial fibrillation noted since 11/13. Very symptomatic. She, of note, has a history of TIA in 2008. Was on dronedarone but stopped due to doughnut hole and now on amiodarone.  3. HTN: edema with amlodipine.  4. Hyperlipidemia: Unable to tolerate statins due to myalgias. 5. Migraines 6. GERD 7. IBS 8. Low back pain  Past Surgical History:  Procedure Laterality Date  . ABDOMINAL HYSTERECTOMY  1973  . CATARACT EXTRACTION W/ INTRAOCULAR LENS  IMPLANT, BILATERAL Bilateral 1999  . CHOLECYSTECTOMY N/A 01/03/2015   Procedure: LAPAROSCOPIC CHOLECYSTECTOMY WITH INTRAOPERATIVE CHOLANGIOGRAM;  Surgeon: Donnie Mesa, MD;   Location: [facility];  Service: General;  Laterality: N/A;  . CORONARY ANGIOPLASTY  1997   Dr Lia Foyer  . CORONARY ARTERY BYPASS GRAFT  1997   Dr Melvenia Needles  . LAPAROSCOPIC CHOLECYSTECTOMY  01/03/2015  . TONSILLECTOMY      Current Medications: No outpatient prescriptions have been marked as taking for the 01/22/17 encounter (Appointment) with Josue Hector, MD.     Allergies:   Asa [aspirin]; Crestor [rosuvastatin calcium]; Hydrocodone; Ibuprofen; Oxycodone hcl; Penicillins; Prednisone; Statins; Morphine and related; and Pristiq [desvenlafaxine]   Social History   Social History  . Marital status: Widowed    Spouse name: N/A  . Number of children: N/A  . Years of education: N/A   Social History Main Topics  . Smoking status: Never Smoker  . Smokeless tobacco: Never Used  . Alcohol use No  . Drug use: No  . Sexual activity: No   Other Topics Concern  . Not on file   Social History Narrative  . No narrative on file     Family History:  The patient's family history includes Coronary artery disease in her sister; Diabetes in her sister; Heart attack in her mother and sister; Heart disease in her sister; Heart failure in her father; Throat cancer in her sister.   ROS:   Please see the history of present illness.    Review of Systems  Constitution: Positive for malaise/fatigue.  Eyes: Positive for visual disturbance.  Cardiovascular: Positive for irregular heartbeat.  Hematologic/Lymphatic: Bruises/bleeds easily.  Musculoskeletal: Positive for joint swelling.  Neurological: Positive for loss of balance.  Psychiatric/Behavioral: Positive for depression.   All other systems reviewed and are negative.   EKGs/Labs/Other Test Reviewed:    EKG:  10/05/16 SR rate 63 nonspecific ST changes     Recent Labs: 10/05/2016: Hemoglobin 12.4; Magnesium 2.0; Platelets 213; TSH 0.807 10/16/2016: ALT 25; BUN 13; Creat 0.73; Potassium 4.1; Sodium 138   Recent Lipid Panel    Component Value  Date/Time   CHOL 175 01/29/2016 1000   CHOL 209 (H) 07/05/2015 0918   TRIG 111 01/29/2016 1000   HDL 50 01/29/2016 1000   HDL 61 07/05/2015 0918   CHOLHDL 3.5 01/29/2016 1000   VLDL 22 01/29/2016 1000   LDLCALC 103 01/29/2016 1000   LDLCALC 124 (H) 07/05/2015 0918     Physical Exam:    VS:  There were no vitals taken for this visit.    Wt Readings from Last 3 Encounters:  12/25/16 152 lb (68.9 kg)  11/19/16 152 lb 3.2 oz (69 kg)  10/16/16 151 lb 9.6 oz (68.8 kg)     Physical Exam  Constitutional: She is oriented to person, place, and time. She appears well-developed and well-nourished. No distress.  HENT:  Head: Normocephalic and atraumatic.  Eyes: No scleral icterus.  Neck: No JVD present.  Cardiovascular: Normal rate, regular rhythm  and normal heart sounds.   No murmur heard. Pulmonary/Chest: Effort normal. She has no wheezes. She has no rales.  Abdominal: Soft. There is no tenderness.  Musculoskeletal: She exhibits no edema.  Neurological: She is alert and oriented to person, place, and time.  Skin: Skin is warm and dry.  Psychiatric: She has a normal mood and affect.    ASSESSMENT:    No diagnosis found. PLAN:    In order of problems listed above:  1. Parox AF -  Continue eliquis and amiodarone   2. CAD - s/p CABG 1997.  No angina.  She is not on ASA as she is on Apixaban.    3. HTN - Well controlled.  Continue current medications and low sodium Dash type diet.    4. Amiodarone Rx - She remains on Amio 100 QD.  ALT/AST elevated 10/07/16 TSH normal 04/2016 Repeat lab work and d/c amiodarone if liver tests remain elevated ***  5. Balance disorder -  Referred to vestibular PT last visit but wanted to handle through her PCP She as meclizine   [name]

## 2017-01-21 ENCOUNTER — Inpatient Hospital Stay (HOSPITAL_COMMUNITY): Admission: RE | Admit: 2017-01-21 | Payer: Medicare Other | Source: Ambulatory Visit

## 2017-01-21 ENCOUNTER — Other Ambulatory Visit: Payer: Self-pay | Admitting: Internal Medicine

## 2017-01-21 ENCOUNTER — Encounter: Payer: Medicare Other | Admitting: Vascular Surgery

## 2017-01-21 DIAGNOSIS — F411 Generalized anxiety disorder: Secondary | ICD-10-CM

## 2017-01-22 ENCOUNTER — Encounter: Payer: Self-pay | Admitting: Cardiovascular Disease

## 2017-01-22 ENCOUNTER — Telehealth: Payer: Self-pay | Admitting: Vascular Surgery

## 2017-01-22 ENCOUNTER — Ambulatory Visit: Payer: Medicare Other | Admitting: Cardiovascular Disease

## 2017-01-22 NOTE — Telephone Encounter (Signed)
Pt lm to resch appt. Ph# pt left [phone] has no vm. Pt's hm # [phone] would not go through.

## 2017-01-23 ENCOUNTER — Telehealth: Payer: Self-pay

## 2017-01-23 ENCOUNTER — Other Ambulatory Visit: Payer: Self-pay | Admitting: Internal Medicine

## 2017-01-23 DIAGNOSIS — R2689 Other abnormalities of gait and mobility: Secondary | ICD-10-CM

## 2017-01-23 NOTE — Telephone Encounter (Signed)
Patient's daughter called to inform Dr.Carter that patient is not safe at home. Patient is frail and a + fall risk. Patient and her daughter would like to consider facility placement for patient and was looking for direction   I gave Celeste the number for [facility] Arnette Norris Stringer/Agency Director) [phone]  [facility] meets with patient and family, recommends communities that are a good fit, arranges tours of the community and research funding sources.  Celeste expressed her gratitude in providing her with this information for it is exactly what she needed.  Celeste also questions if Dr.Carter has any recommendations and if Dr.Carter thinks patient needs home health referral to assist while she waits/researches facilities  Please advise

## 2017-01-24 NOTE — Telephone Encounter (Signed)
Referral order placed.

## 2017-01-24 NOTE — Telephone Encounter (Signed)
Yes I recommend HH - RN med mx, PT/OT for gait training/balance

## 2017-01-27 ENCOUNTER — Encounter: Payer: Self-pay | Admitting: Vascular Surgery

## 2017-01-28 ENCOUNTER — Ambulatory Visit (HOSPITAL_COMMUNITY)
Admission: RE | Admit: 2017-01-28 | Discharge: 2017-01-28 | Disposition: A | Discharge status: Home / Self Care | Payer: Medicare Other | Source: Ambulatory Visit | Attending: Vascular Surgery | Admitting: Vascular Surgery

## 2017-01-28 ENCOUNTER — Other Ambulatory Visit: Payer: Self-pay | Admitting: Internal Medicine

## 2017-01-28 ENCOUNTER — Encounter: Payer: Self-pay | Admitting: Vascular Surgery

## 2017-01-28 ENCOUNTER — Ambulatory Visit (INDEPENDENT_AMBULATORY_CARE_PROVIDER_SITE_OTHER): Payer: Medicare Other | Admitting: Vascular Surgery

## 2017-01-28 VITALS — BP 153/70 | HR 62 | Temp 97.1°F | Resp 16 | Ht 67.0 in | Wt 150.2 lb

## 2017-01-28 DIAGNOSIS — I83893 Varicose veins of bilateral lower extremities with other complications: Secondary | ICD-10-CM | POA: Diagnosis not present

## 2017-01-28 DIAGNOSIS — R609 Edema, unspecified: Secondary | ICD-10-CM | POA: Diagnosis present

## 2017-01-28 DIAGNOSIS — I2510 Atherosclerotic heart disease of native coronary artery without angina pectoris: Secondary | ICD-10-CM | POA: Diagnosis not present

## 2017-01-28 NOTE — Progress Notes (Signed)
[facility]ist of [facility]  Patient name: Marissa Bowen MRN: [id number] DOB: 1927/03/17 Sex: female  REASON FOR CONSULT: Relation of lower extremity discomfort and venous pathology bilaterally  HPI: Marissa Bowen is a 83 y.o. female, who is seen today for evaluation of lower from a discomfort. She reports achy sensation over her pretibial areas bilaterally. She does not have any swelling currently but does report that she can have some swelling at the end of the day. She has no history of DVT. I had cared for her husband over the course of many years. He died 3 years ago and she is still grieving with some depression associated with this. They've been married for 65 years.  Past Medical History:  Diagnosis Date  . Anemia   . Anxiety   . BACK PAIN, LUMBAR 09/27/2009  . CAD (coronary artery disease)    a. s/p remote CABG 1997, 3V.;  b. LexiScan Myoview (8/15): No ischemia, EF 72%, normal study;  c. Echo 12/13 - EF 60-65%, no significant valvular abnormalities, normal RV size and systolic function.   . CVA (cerebral infarction)    a. Thalamic infarct 09/2009. //  b. hx of TIA in 2008  . Gastric ulcer   . GERD 11/25/2006  . GI bleed    a. 09/2009: secondary to antral ulcer.  . Hiatal hernia   . History of diverticulitis Dx 02/2012  . History of MI (myocardial infarction)   . HYPERLIPIDEMIA   . HYPERTENSION   . IBS 12/07/2008  . MIGRAINE WITH AURA 08/13/2007  . Mild carotid artery disease ([place])    a. 0-39% bilat 05/2011 (f/u recommended 05/2013). //  b. Carotid US 3/17 - Bilat ICA 1-39% >> FU prn  . Muscle weakness (generalized) 09/27/2009  . OSTEOARTHRITIS 08/13/2007  . PAF (paroxysmal atrial fibrillation) ([place]) 11/25/2006   a. Multaq >> changes to Amiodarone 2/2 $$  //  Eliquis (CHADS2-VASc = 6)  . Urinary, incontinence, stress female    wears Pessary    Family History  Problem Relation Age of Onset  . Heart attack Mother   . Heart  failure Father   . Throat cancer Sister   . Heart disease Sister   . Coronary artery disease Sister   . Diabetes Sister   . Heart attack Sister   . Colon cancer Neg Hx     SOCIAL HISTORY: Social History   Social History  . Marital status: Widowed    Spouse name: N/A  . Number of children: N/A  . Years of education: N/A   Occupational History  . Not on file.   Social History Main Topics  . Smoking status: Never Smoker  . Smokeless tobacco: Never Used  . Alcohol use No  . Drug use: No  . Sexual activity: No   Other Topics Concern  . Not on file   Social History Narrative  . No narrative on file    Allergies  Allergen Reactions  . Asa [Aspirin] Other (See Comments)    Bleeding ulcer  . Crestor [Rosuvastatin Calcium] Other (See Comments)    myalgia  . Hydrocodone Other (See Comments)    REACTION: migraines  . Ibuprofen Other (See Comments)    ulcers  . Oxycodone Hcl Other (See Comments)    REACTION: migraines  . Penicillins Swelling and Rash    Has patient had a PCN reaction causing immediate rash, facial/tongue/throat swelling, SOB or lightheadedness with hypotension: Yes Has patient had a PCN reaction  causing severe rash involving mucus membranes or skin necrosis: No Has patient had a PCN reaction that required hospitalization No Has patient had a PCN reaction occurring within the last 10 years: No If all of the above answers are "NO", then may proceed with Cephalosporin use.   . Prednisone Other (See Comments)    ulcers  . Statins Other (See Comments)    Aches and pains  . Morphine And Related Other (See Comments)     Had hallucinations with morphine sulfate in the past    . Pristiq [Desvenlafaxine] Other (See Comments)    Drowsiness and hangover sensation    Current Outpatient Prescriptions  Medication Sig Dispense Refill  . acetaminophen (TYLENOL) 500 MG tablet Take 500-1,000 mg by mouth every 6 (six) hours as needed for moderate pain.     Marland Kitchen  amiodarone (PACERONE) 200 MG tablet TAKE ONE-HALF TABLET DAILY 15 tablet 5  . DULoxetine (CYMBALTA) 60 MG capsule Take 1 capsule (60 mg total) by mouth daily. 30 capsule 2  . ELIQUIS 5 MG TABS tablet Take 1 tablet (5 mg total) by mouth 2 (two) times daily. 180 tablet 3  . LORazepam (ATIVAN) 1 MG tablet TAKE ONE TABLET EVERY EIGHT HOURS AS NEEDED FOR ANXIETY 90 tablet 0  . losartan (COZAAR) 50 MG tablet TAKE ONE TABLET TWICE DAILY 60 tablet 6  . meclizine (ANTIVERT) 12.5 MG tablet TAKE ONE OR TWO TABLETS EVERY EIGHT HOURS AS NEEDED FOR DIZZINESS 90 tablet 0  . Multiple Vitamin (MULTIVITAMIN WITH MINERALS) TABS tablet Take 1 tablet by mouth daily.    . Multiple Vitamins-Minerals (PRESERVISION AREDS) TABS Take 1 tablet by mouth 2 (two) times daily.     . niacin 500 MG CR capsule Take 500 mg by mouth 2 (two) times daily.     . pantoprazole (PROTONIX) 40 MG tablet TAKE ONE TABLET TWICE DAILY 180 tablet 2  . sodium chloride (OCEAN) 0.65 % SOLN nasal spray Place 1 spray into both nostrils at bedtime.     . Tetrahydrozoline HCl (VISINE OP) Place 2 drops into both eyes 2 (two) times daily as needed (for dry eyes).     . traMADol (ULTRAM) 50 MG tablet ONE TABLET EVERY EIGHT HOURS AS NEEDED FOR MODERATE TO SEVERE PAIN 90 tablet 0  . triamcinolone cream (KENALOG) 0.1 % Apply 1 application topically 2 (two) times daily as needed (dry skin).    Marland Kitchen zolpidem (AMBIEN) 10 MG tablet Take 1 tablet (10 mg total) by mouth at bedtime as needed. for sleep 30 tablet 0   No current facility-administered medications for this visit.     REVIEW OF SYSTEMS:  [X]  denotes positive finding, [ ]  denotes negative finding Cardiac  Comments:  Chest pain or chest pressure:    Shortness of breath upon exertion:    Short of breath when lying flat:    Irregular heart rhythm:        Vascular    Pain in calf, thigh, or hip brought on by ambulation:    Pain in feet at night that wakes you up from your sleep:     Blood clot in your  veins:    Leg swelling:         Pulmonary    Oxygen at home:    Productive cough:     Wheezing:         Neurologic    Sudden weakness in arms or legs:     Sudden numbness in arms or legs:  Sudden onset of difficulty speaking or slurred speech:    Temporary loss of vision in one eye:     Problems with dizziness:         Gastrointestinal    Blood in stool:     Vomited blood:         Genitourinary    Burning when urinating:     Blood in urine:        Psychiatric    Major depression:  x       Hematologic    Bleeding problems:    Problems with blood clotting too easily:        Skin    Rashes or ulcers:        Constitutional    Fever or chills:      PHYSICAL EXAM: Vitals:   01/28/17 0956  BP: (!) 153/70  Pulse: 62  Resp: 16  Temp: (!) 97.1 F (36.2 C)  TempSrc: Oral  SpO2: 95%  Weight: 150 lb 3.2 oz (68.1 kg)  Height: 5\' 7"  (1.702 m)    GENERAL: The patient is a well-nourished female, in no acute distress. The vital signs are documented above. CARDIOVASCULAR: Palpable radial and palpable dorsalis pedis pulses bilaterally PULMONARY: There is good air exchange  ABDOMEN: Soft and non-tender  MUSCULOSKELETAL: There are no major deformities or cyanosis. NEUROLOGIC: No focal weakness or paresthesias are detected. SKIN: There are no ulcers or rashes noted. He does have scattered telangiectasia over both lower extremities more so over the right than on the left and some small reticular veins but no large varicose veins. There are no hemosiderin deposits in the venous ulcers. PSYCHIATRIC: The patient has a normal affect.  DATA:  Noninvasive venous studies revealed no evidence of reflux in her saphenous vein. She has had the vein harvest from her knee distally on the right for coronary bypass grafting in the past. She has no evidence of DVT and only mild deep venous reflux  MEDICAL ISSUES: I reassured with the patient with this. I do not see any arterial or venous  pathology to explain her pretibial aching discomfort. She reports mild swelling at the end of the day. I did explain treatment of this would be elevation compression garments but feel that her symptoms are mild enough that would not be to the extent that she wishes to try compression. She has tried these in the past and had difficult time wearing these. She was reassured this discussion will see [place] again on as-needed basis   Rosetta Posner, MD [facility] Tel [phone] Pager [phone]

## 2017-01-29 ENCOUNTER — Ambulatory Visit (INDEPENDENT_AMBULATORY_CARE_PROVIDER_SITE_OTHER): Payer: Medicare Other | Admitting: Internal Medicine

## 2017-01-29 ENCOUNTER — Other Ambulatory Visit: Payer: Self-pay | Admitting: Internal Medicine

## 2017-01-29 ENCOUNTER — Encounter: Payer: Self-pay | Admitting: Internal Medicine

## 2017-01-29 VITALS — BP 150/72 | HR 68 | Temp 98.1°F | Resp 14 | Ht 67.0 in | Wt 149.8 lb

## 2017-01-29 DIAGNOSIS — G8929 Other chronic pain: Secondary | ICD-10-CM

## 2017-01-29 DIAGNOSIS — M159 Polyosteoarthritis, unspecified: Secondary | ICD-10-CM

## 2017-01-29 DIAGNOSIS — F332 Major depressive disorder, recurrent severe without psychotic features: Secondary | ICD-10-CM | POA: Diagnosis not present

## 2017-01-29 DIAGNOSIS — I2510 Atherosclerotic heart disease of native coronary artery without angina pectoris: Secondary | ICD-10-CM | POA: Diagnosis not present

## 2017-01-29 DIAGNOSIS — M25561 Pain in right knee: Principal | ICD-10-CM

## 2017-01-29 DIAGNOSIS — M150 Primary generalized (osteo)arthritis: Secondary | ICD-10-CM | POA: Diagnosis not present

## 2017-01-29 DIAGNOSIS — Z23 Encounter for immunization: Secondary | ICD-10-CM | POA: Diagnosis not present

## 2017-01-29 DIAGNOSIS — F418 Other specified anxiety disorders: Secondary | ICD-10-CM | POA: Diagnosis not present

## 2017-01-29 DIAGNOSIS — R296 Repeated falls: Secondary | ICD-10-CM | POA: Diagnosis not present

## 2017-01-29 DIAGNOSIS — I480 Paroxysmal atrial fibrillation: Secondary | ICD-10-CM | POA: Diagnosis not present

## 2017-01-29 DIAGNOSIS — R2689 Other abnormalities of gait and mobility: Secondary | ICD-10-CM

## 2017-01-29 DIAGNOSIS — I10 Essential (primary) hypertension: Secondary | ICD-10-CM | POA: Diagnosis not present

## 2017-01-29 DIAGNOSIS — F411 Generalized anxiety disorder: Secondary | ICD-10-CM

## 2017-01-29 DIAGNOSIS — M6281 Muscle weakness (generalized): Secondary | ICD-10-CM | POA: Diagnosis not present

## 2017-01-29 DIAGNOSIS — I252 Old myocardial infarction: Secondary | ICD-10-CM | POA: Diagnosis not present

## 2017-01-29 DIAGNOSIS — Z8673 Personal history of transient ischemic attack (TIA), and cerebral infarction without residual deficits: Secondary | ICD-10-CM | POA: Diagnosis not present

## 2017-01-29 MED ORDER — TRAMADOL HCL 50 MG PO TABS: ORAL_TABLET | ORAL | 0 refills | Status: DC

## 2017-01-29 MED ORDER — ZOLPIDEM TARTRATE 10 MG PO TABS: 10.0000 mg | ORAL_TABLET | Freq: Every evening | ORAL | 1 refills | Status: DC | PRN

## 2017-01-29 MED ORDER — DULOXETINE HCL 60 MG PO CPEP: 60.0000 mg | ORAL_CAPSULE | Freq: Every day | ORAL | 6 refills | Status: DC

## 2017-01-29 NOTE — Patient Instructions (Addendum)
Continue current medications as ordered  Continue Home Health PT/OT, RN  Influenza vaccine given today  Follow up in 1 month for depression/anxiety, chronic pain

## 2017-01-29 NOTE — Progress Notes (Signed)
Patient ID: Marissa Bowen, female   DOB: [birth year], 83 y.o.   MRN: [id number]   Location:  [facility]  Provider: Dr. Eulas Post  Code Status:  Goals of Care:  Advanced Directives 01/29/2017  Does Patient Have a Medical Advance Directive? No  Type of Advance Directive -  Does patient want to make changes to medical advance directive? -  Copy of [place] in Chart? -  Would patient like information on creating a medical advance directive? -     Chief Complaint  Patient presents with  . Medical Management of Chronic Issues    follow up; needs refills on ambien and tramadol; flu vaccine    HPI: Patient is a 83 y.o. female seen today for medical management of of depression and anxiety. She complains of bilateral knee pain, wrist and shoulder pain. She states history of arthritis. She takes tramadol and tylenol for the pain and it helps her pain.   Depression/grief reaction - She states she does not like taking medication for depression as it makes her fell "spacy". Her depression is currently uncontrolled.   Past Medical History:  Diagnosis Date  . Anemia   . Anxiety   . BACK PAIN, LUMBAR 09/27/2009  . CAD (coronary artery disease)    a. s/p remote CABG 1997, 3V.;  b. LexiScan Myoview (8/15): No ischemia, EF 72%, normal study;  c. Echo 12/13 - EF 60-65%, no significant valvular abnormalities, normal RV size and systolic function.   . CVA (cerebral infarction)    a. Thalamic infarct 09/2009. //  b. hx of TIA in 2008  . Gastric ulcer   . GERD 11/25/2006  . GI bleed    a. 09/2009: secondary to antral ulcer.  . Hiatal hernia   . History of diverticulitis Dx 02/2012  . History of MI (myocardial infarction)   . HYPERLIPIDEMIA   . HYPERTENSION   . IBS 12/07/2008  . MIGRAINE WITH AURA 08/13/2007  . Mild carotid artery disease ([place])    a. 0-39% bilat 05/2011 (f/u recommended 05/2013). //  b. Carotid US 3/17 - Bilat ICA 1-39% >> FU prn  . Muscle weakness (generalized) 09/27/2009   . OSTEOARTHRITIS 08/13/2007  . PAF (paroxysmal atrial fibrillation) ([place]) 11/25/2006   a. Multaq >> changes to Amiodarone 2/2 $$  //  Eliquis (CHADS2-VASc = 6)  . Urinary, incontinence, stress female    wears Pessary    Past Surgical History:  Procedure Laterality Date  . ABDOMINAL HYSTERECTOMY  1973  . CATARACT EXTRACTION W/ INTRAOCULAR LENS  IMPLANT, BILATERAL Bilateral 1999  . CHOLECYSTECTOMY N/A 01/03/2015   Procedure: LAPAROSCOPIC CHOLECYSTECTOMY WITH INTRAOPERATIVE CHOLANGIOGRAM;  Surgeon: Donnie Mesa, MD;  Location: [facility];  Service: General;  Laterality: N/A;  . CORONARY ANGIOPLASTY  1997   Dr Lia Foyer  . CORONARY ARTERY BYPASS GRAFT  1997   Dr Melvenia Needles  . LAPAROSCOPIC CHOLECYSTECTOMY  01/03/2015  . TONSILLECTOMY      Allergies  Allergen Reactions  . Asa [Aspirin] Other (See Comments)    Bleeding ulcer  . Crestor [Rosuvastatin Calcium] Other (See Comments)    myalgia  . Hydrocodone Other (See Comments)    REACTION: migraines  . Ibuprofen Other (See Comments)    ulcers  . Oxycodone Hcl Other (See Comments)    REACTION: migraines  . Penicillins Swelling and Rash    Has patient had a PCN reaction causing immediate rash, facial/tongue/throat swelling, SOB or lightheadedness with hypotension: Yes Has patient had a PCN reaction causing severe  rash involving mucus membranes or skin necrosis: No Has patient had a PCN reaction that required hospitalization No Has patient had a PCN reaction occurring within the last 10 years: No If all of the above answers are "NO", then may proceed with Cephalosporin use.   . Prednisone Other (See Comments)    ulcers  . Statins Other (See Comments)    Aches and pains  . Morphine And Related Other (See Comments)     Had hallucinations with morphine sulfate in the past    . Pristiq [Desvenlafaxine] Other (See Comments)    Drowsiness and hangover sensation    Outpatient Encounter Prescriptions as of 01/29/2017  Medication Sig  .  acetaminophen (TYLENOL) 500 MG tablet Take 500-1,000 mg by mouth every 6 (six) hours as needed for moderate pain.   Marland Kitchen amiodarone (PACERONE) 200 MG tablet TAKE ONE-HALF TABLET DAILY  . DULoxetine (CYMBALTA) 60 MG capsule Take 1 capsule (60 mg total) by mouth daily.  Marland Kitchen ELIQUIS 5 MG TABS tablet Take 1 tablet (5 mg total) by mouth 2 (two) times daily.  Marland Kitchen LORazepam (ATIVAN) 1 MG tablet TAKE ONE TABLET EVERY EIGHT HOURS AS NEEDED FOR ANXIETY  . losartan (COZAAR) 50 MG tablet TAKE ONE TABLET TWICE DAILY  . meclizine (ANTIVERT) 12.5 MG tablet TAKE ONE OR TWO TABLETS EVERY EIGHT HOURS AS NEEDED FOR DIZZINESS  . Multiple Vitamin (MULTIVITAMIN WITH MINERALS) TABS tablet Take 1 tablet by mouth daily.  . Multiple Vitamins-Minerals (PRESERVISION AREDS) TABS Take 1 tablet by mouth 2 (two) times daily.   . niacin 500 MG CR capsule Take 500 mg by mouth 2 (two) times daily.   . pantoprazole (PROTONIX) 40 MG tablet TAKE ONE TABLET TWICE DAILY  . sodium chloride (OCEAN) 0.65 % SOLN nasal spray Place 1 spray into both nostrils at bedtime.   . Tetrahydrozoline HCl (VISINE OP) Place 2 drops into both eyes 2 (two) times daily as needed (for dry eyes).   . traMADol (ULTRAM) 50 MG tablet Take one tablet by mouth every eight hours as needed for moderate to severe pain  . triamcinolone cream (KENALOG) 0.1 % Apply 1 application topically 2 (two) times daily as needed (dry skin).  Marland Kitchen zolpidem (AMBIEN) 10 MG tablet TAKE 1 TABLET AT BEDTIME AS NEEDED FOR SLEEP  . [DISCONTINUED] traMADol (ULTRAM) 50 MG tablet ONE TABLET EVERY EIGHT HOURS AS NEEDED FOR MODERATE TO SEVERE PAIN  . [DISCONTINUED] zolpidem (AMBIEN) 10 MG tablet Take 1 tablet (10 mg total) by mouth at bedtime as needed. for sleep   No facility-administered encounter medications on file as of 01/29/2017.     Review of Systems:  Review of Systems  Constitutional: Negative for activity change, appetite change, chills, diaphoresis, fatigue, fever and unexpected weight  change.  HENT: Negative for congestion, dental problem, drooling, ear discharge, ear pain, facial swelling, hearing loss, mouth sores, nosebleeds, postnasal drip, rhinorrhea, sinus pain, sinus pressure, sneezing, sore throat, tinnitus, trouble swallowing and voice change.   Eyes: Negative for photophobia, pain, discharge, redness, itching and visual disturbance.  Respiratory: Negative for apnea, cough, choking, chest tightness, shortness of breath, wheezing and stridor.   Cardiovascular: Negative for chest pain, palpitations and leg swelling.  Gastrointestinal: Negative for abdominal distention, abdominal pain, anal bleeding, blood in stool, constipation, diarrhea, nausea, rectal pain and vomiting.  Genitourinary: Negative for difficulty urinating, dyspareunia, dysuria, enuresis, flank pain, frequency, hematuria and urgency.  Musculoskeletal: Positive for arthralgias (Bilateral knees, wrists, shoudlers) and myalgias (bilateral thigh pain). Negative for back pain,  gait problem, joint swelling, neck pain and neck stiffness.  Skin: Negative for color change, pallor, rash and wound.  Allergic/Immunologic: Negative for environmental allergies, food allergies and immunocompromised state.  Neurological: Negative for dizziness, tremors, seizures, syncope, facial asymmetry, speech difficulty, weakness, light-headedness, numbness and headaches.  Hematological: Negative for adenopathy. Does not bruise/bleed easily.  Psychiatric/Behavioral: Negative for agitation, behavioral problems, confusion, decreased concentration, dysphoric mood, hallucinations, self-injury, sleep disturbance and suicidal ideas. The patient is not nervous/anxious and is not hyperactive.     Health Maintenance  Topic Date Due  . INFLUENZA VACCINE  11/27/2016  . PNA vac Low Risk Adult (2 of 2 - PPSV23) 11/10/2025 (Originally 09/30/2014)  . TETANUS/TDAP  11/12/2028 (Originally 10/28/1945)  . DEXA SCAN  Completed    Physical Exam: Vitals:     01/29/17 1149  BP: (!) 150/72  Pulse: 68  Resp: 14  Temp: 98.1 F (36.7 C)  TempSrc: Oral  SpO2: 96%  Weight: 149 lb 12.8 oz (67.9 kg)  Height: 5\' 7"  (1.702 m)   Body mass index is 23.46 kg/m. Physical Exam  Constitutional: She is oriented to person, place, and time. She appears well-developed and well-nourished. No distress.  HENT:  Head: Normocephalic and atraumatic.  Right Ear: External ear normal.  Left Ear: External ear normal.  Nose: Nose normal.  Mouth/Throat: Oropharynx is clear and moist.  Eyes: Conjunctivae and EOM are normal. Right eye exhibits no discharge. Left eye exhibits no discharge. No scleral icterus.  Neck: Normal range of motion. Neck supple. No JVD present. No tracheal deviation present. No thyromegaly present.  Cardiovascular: Normal rate, regular rhythm, normal heart sounds and intact distal pulses.  Exam reveals no gallop and no friction rub.   No murmur heard. Pulmonary/Chest: Effort normal and breath sounds normal. No stridor. No respiratory distress. She has no wheezes. She has no rales. She exhibits no tenderness.  Abdominal: Soft. Bowel sounds are normal. She exhibits no distension and no mass. There is no tenderness. There is no rebound and no guarding.  Musculoskeletal: Normal range of motion. She exhibits no edema, tenderness or deformity.  Lymphadenopathy:    She has no cervical adenopathy.  Neurological: She is alert and oriented to person, place, and time. No cranial nerve deficit. Coordination normal.  Skin: Skin is warm and dry. No rash noted. She is not diaphoretic. No erythema. No pallor.  Psychiatric: Her speech is normal and behavior is normal. Judgment and thought content normal. Her mood appears anxious. Cognition and memory are normal.    Labs reviewed: Basic Metabolic Panel:  Recent Labs  05/04/16 1258 05/10/16 1436  10/05/16 0226 10/06/16 0929 10/07/16 0538 10/16/16 1203  NA 138  --   < > 133* 137 138 138  K 3.9  --   <  > 3.7 3.6 4.2 4.1  CL 101  --   < > 98* 103 104 101  CO2 28  --   < > 26 25 25 26   GLUCOSE 151*  --   < > 163* 185* 103* 113*  BUN 11  --   < > 11 11 10 13   CREATININE 0.69  --   < > 0.67 0.74 0.65 0.73  CALCIUM 9.4  --   < > 9.2 9.1 8.7* 9.2  MG 1.9  --   --  2.0  --   --   --   PHOS  --   --   --  3.3  --   --   --  TSH  --  1.87  --  0.807  --   --   --   < > = values in this interval not displayed. Liver Function Tests:  Recent Labs  10/06/16 0929 10/07/16 0538 10/16/16 1203  AST 162* 64* 16  ALT 306* 196* 25  ALKPHOS 141* 119 91  BILITOT 0.9 0.6 0.4  PROT 6.7 6.1* 6.7  ALBUMIN 3.6 3.2* 4.1    Recent Labs  03/07/16 1447 10/05/16 0226  LIPASE 34 28  AMYLASE 57  --    No results for input(s): AMMONIA in the last 8760 hours. CBC:  Recent Labs  05/04/16 1258 10/04/16 1707 10/05/16 0226  WBC 6.0 18.1* 14.1*  NEUTROABS 4.1 16.3* 12.3*  HGB 13.8 13.1 12.4  HCT 42.5 39.8 37.6  MCV 91.2 91.7 91.0  PLT 215 198 213   Lipid Panel: No results for input(s): CHOL, HDL, LDLCALC, TRIG, CHOLHDL, LDLDIRECT in the last 8760 hours. Lab Results  Component Value Date   HGBA1C 5.5 10/05/2016    Procedures since last visit: Ct Head Wo Contrast  Result Date: 01/15/2017 CLINICAL DATA:  Pain following fall EXAM: CT HEAD WITHOUT CONTRAST CT CERVICAL SPINE WITHOUT CONTRAST TECHNIQUE: Multidetector CT imaging of the head and cervical spine was performed following the standard protocol without intravenous contrast. Multiplanar CT image reconstructions of the cervical spine were also generated. COMPARISON:  Head CT September 28, 2009 ; brain MRI September 28, 2009 FINDINGS: CT HEAD FINDINGS Brain: Mild diffuse atrophy is stable. Right lateral ventricle is larger than the left lateral ventricle, a stable anatomic variant. There is no evident intracranial mass, hemorrhage, extra-axial fluid collection, or midline shift. There is small vessel disease throughout the centra semiovale bilaterally. There  is evidence of a prior infarct in the lateral right thalamus, noted to have been acute on 2011 MR study. On the current examination, there is no evidence suggesting acute infarct. Vascular: There is no appreciable hyperdense vessel. There is calcification in both distal vertebral arteries and basilar artery as well as in the carotid siphon regions bilaterally. Skull: Bony calvarium appears intact. Sinuses/Orbits: There is mucosal thickening and opacification in several ethmoid air cells bilaterally. Other paranasal sinuses are clear. Orbits appear symmetric bilaterally. Other: Mastoid air cells are clear. CT CERVICAL SPINE FINDINGS Alignment: There is no appreciable spondylolisthesis. Skull base and vertebrae: Skull base and craniocervical junction regions appear normal. There is moderate pannus with calcification posterior to the odontoid which is not causing appreciable impression on the craniocervical junction. There is no appreciable fracture. There are no blastic or lytic bone lesions. Soft tissues and spinal canal: Prevertebral soft tissues and predental space regions are normal. There are foci of nuchal ligament calcification posteriorly. There is no paraspinous lesion. No cord or canal hematoma is evident. Disc levels: There is moderately severe disc space narrowing at C5-6 and C6-7. There is moderate disc space narrowing at C3-4, C4-5, C7-T1, and T1-2. There is multilevel facet osteoarthritic change. There is no appreciable nerve root edema or effacement. No disc extrusion or stenosis evident. Upper chest: Visualized upper lung zones are clear. Other: There are foci of carotid artery calcification bilaterally. There is a subcentimeter nodular lesion in the left lobe of the thyroid. IMPRESSION: CT head: Atrophy with fairly extensive periventricular small vessel disease. Prior infarct lateral right thalamus. No acute infarct evident. No mass, hemorrhage, or extra-axial fluid collection. There are foci of  arterial vascular calcification. There is ethmoid air cell disease bilaterally. CT cervical spine: No fracture  or spondylolisthesis. There is multilevel arthropathy. No disc extrusion or stenosis. There are foci of carotid artery calcification bilaterally. A subcentimeter nodular opacity in the left lobe of the thyroid does not meet sufficient size to warrant additional imaging surveillance per consensus guidelines. Electronically Signed   By: Lowella Grip III M.D.   On: 01/15/2017 17:53   Ct Cervical Spine Wo Contrast  Result Date: 01/15/2017 CLINICAL DATA:  Pain following fall EXAM: CT HEAD WITHOUT CONTRAST CT CERVICAL SPINE WITHOUT CONTRAST TECHNIQUE: Multidetector CT imaging of the head and cervical spine was performed following the standard protocol without intravenous contrast. Multiplanar CT image reconstructions of the cervical spine were also generated. COMPARISON:  Head CT September 28, 2009 ; brain MRI September 28, 2009 FINDINGS: CT HEAD FINDINGS Brain: Mild diffuse atrophy is stable. Right lateral ventricle is larger than the left lateral ventricle, a stable anatomic variant. There is no evident intracranial mass, hemorrhage, extra-axial fluid collection, or midline shift. There is small vessel disease throughout the centra semiovale bilaterally. There is evidence of a prior infarct in the lateral right thalamus, noted to have been acute on 2011 MR study. On the current examination, there is no evidence suggesting acute infarct. Vascular: There is no appreciable hyperdense vessel. There is calcification in both distal vertebral arteries and basilar artery as well as in the carotid siphon regions bilaterally. Skull: Bony calvarium appears intact. Sinuses/Orbits: There is mucosal thickening and opacification in several ethmoid air cells bilaterally. Other paranasal sinuses are clear. Orbits appear symmetric bilaterally. Other: Mastoid air cells are clear. CT CERVICAL SPINE FINDINGS Alignment: There is no  appreciable spondylolisthesis. Skull base and vertebrae: Skull base and craniocervical junction regions appear normal. There is moderate pannus with calcification posterior to the odontoid which is not causing appreciable impression on the craniocervical junction. There is no appreciable fracture. There are no blastic or lytic bone lesions. Soft tissues and spinal canal: Prevertebral soft tissues and predental space regions are normal. There are foci of nuchal ligament calcification posteriorly. There is no paraspinous lesion. No cord or canal hematoma is evident. Disc levels: There is moderately severe disc space narrowing at C5-6 and C6-7. There is moderate disc space narrowing at C3-4, C4-5, C7-T1, and T1-2. There is multilevel facet osteoarthritic change. There is no appreciable nerve root edema or effacement. No disc extrusion or stenosis evident. Upper chest: Visualized upper lung zones are clear. Other: There are foci of carotid artery calcification bilaterally. There is a subcentimeter nodular lesion in the left lobe of the thyroid. IMPRESSION: CT head: Atrophy with fairly extensive periventricular small vessel disease. Prior infarct lateral right thalamus. No acute infarct evident. No mass, hemorrhage, or extra-axial fluid collection. There are foci of arterial vascular calcification. There is ethmoid air cell disease bilaterally. CT cervical spine: No fracture or spondylolisthesis. There is multilevel arthropathy. No disc extrusion or stenosis. There are foci of carotid artery calcification bilaterally. A subcentimeter nodular opacity in the left lobe of the thyroid does not meet sufficient size to warrant additional imaging surveillance per consensus guidelines. Electronically Signed   By: Lowella Grip III M.D.   On: 01/15/2017 17:53    Assessment/Plan 1. Chronic pain of right knee  - traMADol (ULTRAM) 50 MG tablet; Take one tablet by mouth every eight hours as needed for moderate to severe  pain  Dispense: 90 tablet; Refill: 0  2. Primary osteoarthritis involving multiple joints  - traMADol (ULTRAM) 50 MG tablet; Take one tablet by mouth every eight hours as needed  for moderate to severe pain  Dispense: 90 tablet; Refill: 0  Zolpidem 10 mg tablet by mouth at bedtime as needed for sleep.  Continue other medications as ordered.  Continue to follow up  With specialists as scheduled.   Follow up in 1 month for Arthritis and MDD/GAD.  Labs/tests ordered:   Next appt:  Visit date not found   Anuel Sitter C. Jenese Mischke, Student AGACNP

## 2017-01-29 NOTE — Telephone Encounter (Signed)
Patient requested refill

## 2017-01-29 NOTE — Progress Notes (Signed)
Patient ID: Marissa Bowen, female   DOB: [birth year], 83 y.o.   MRN: [id number]    Location:  [facility] Place of Service: OFFICE  Chief Complaint  Patient presents with  . Medical Management of Chronic Issues    follow up; needs refills on ambien and tramadol; flu vaccine    HPI:  83 yo female seen today for f/u anxiety/depression. She was started on cymbalta 60mg  daily at her last OV. She had no issues with med but has not gotten it refilled as she did not know why she was taking it. Daughter called to the office on 01/23/17 with c/a pt's safety at home and cited pt at increased risk of falls 2/2 frailty. HH has started. RN to come out today and PT/OT ordered as well for gait training and balance.   Depression/grief reaction - uncontrolled. has tried 4 meds (lexapro, zoloft, remeron and trazodone) in the past and unable to tolerate ADRs. Grief better on cymbalta. No anaphylaxis to meds tried. She misses her deceased spouse. Some short term memory issues. Takes prn lorazepam and prn ambien. She has difficulty staying asleep.   Chronic pain syndrome - improving on cymbalta. Takes tramadol prn  Past Medical History:  Diagnosis Date  . Anemia   . Anxiety   . BACK PAIN, LUMBAR 09/27/2009  . CAD (coronary artery disease)    a. s/p remote CABG 1997, 3V.;  b. LexiScan Myoview (8/15): No ischemia, EF 72%, normal study;  c. Echo 12/13 - EF 60-65%, no significant valvular abnormalities, normal RV size and systolic function.   . CVA (cerebral infarction)    a. Thalamic infarct 09/2009. //  b. hx of TIA in 2008  . Gastric ulcer   . GERD 11/25/2006  . GI bleed    a. 09/2009: secondary to antral ulcer.  . Hiatal hernia   . History of diverticulitis Dx 02/2012  . History of MI (myocardial infarction)   . HYPERLIPIDEMIA   . HYPERTENSION   . IBS 12/07/2008  . MIGRAINE WITH AURA 08/13/2007  . Mild carotid artery disease ([place])    a. 0-39% bilat 05/2011 (f/u recommended 05/2013). //  b. Carotid US 3/17 - Bilat  ICA 1-39% >> FU prn  . Muscle weakness (generalized) 09/27/2009  . OSTEOARTHRITIS 08/13/2007  . PAF (paroxysmal atrial fibrillation) ([place]) 11/25/2006   a. Multaq >> changes to Amiodarone 2/2 $$  //  Eliquis (CHADS2-VASc = 6)  . Urinary, incontinence, stress female    wears Pessary    Past Surgical History:  Procedure Laterality Date  . ABDOMINAL HYSTERECTOMY  1973  . CATARACT EXTRACTION W/ INTRAOCULAR LENS  IMPLANT, BILATERAL Bilateral 1999  . CHOLECYSTECTOMY N/A 01/03/2015   Procedure: LAPAROSCOPIC CHOLECYSTECTOMY WITH INTRAOPERATIVE CHOLANGIOGRAM;  Surgeon: Donnie Mesa, MD;  Location: [facility];  Service: General;  Laterality: N/A;  . CORONARY ANGIOPLASTY  1997   Dr Lia Foyer  . CORONARY ARTERY BYPASS GRAFT  1997   Dr Melvenia Needles  . LAPAROSCOPIC CHOLECYSTECTOMY  01/03/2015  . TONSILLECTOMY      Patient Care Team: Gildardo Cranker, DO as PCP - General (Internal Medicine) Hillary Bow, MD (Cardiology) Melissa Montane, MD as Consulting Physician (Otolaryngology) Arvella Nigh, MD as Consulting Physician (Obstetrics and Gynecology) Rutherford Guys, MD as Consulting Physician (Ophthalmology)  Social History   Social History  . Marital status: Widowed    Spouse name: N/A  . Number of children: N/A  . Years of education: N/A   Occupational History  . Not on file.  Social History Main Topics  . Smoking status: Never Smoker  . Smokeless tobacco: Never Used  . Alcohol use No  . Drug use: No  . Sexual activity: No   Other Topics Concern  . Not on file   Social History Narrative  . No narrative on file     reports that she has never smoked. She has never used smokeless tobacco. She reports that she does not drink alcohol or use drugs.  Family History  Problem Relation Age of Onset  . Heart attack Mother   . Heart failure Father   . Throat cancer Sister   . Heart disease Sister   . Coronary artery disease Sister   . Diabetes Sister   . Heart attack Sister   . Colon cancer Neg Hx     Family Status  Relation Status  . Mother Deceased  . Father Deceased  . Sister Deceased  . Sister Deceased  . Sister Deceased  . Brother Deceased  . Daughter Alive  . Son Alive  . Sister Alive  . Daughter Alive  . MGM Deceased  . MGF Deceased  . PGM Deceased  . PGF Deceased  . Neg Hx (Not Specified)     Allergies  Allergen Reactions  . Asa [Aspirin] Other (See Comments)    Bleeding ulcer  . Crestor [Rosuvastatin Calcium] Other (See Comments)    myalgia  . Hydrocodone Other (See Comments)    REACTION: migraines  . Ibuprofen Other (See Comments)    ulcers  . Oxycodone Hcl Other (See Comments)    REACTION: migraines  . Penicillins Swelling and Rash    Has patient had a PCN reaction causing immediate rash, facial/tongue/throat swelling, SOB or lightheadedness with hypotension: Yes Has patient had a PCN reaction causing severe rash involving mucus membranes or skin necrosis: No Has patient had a PCN reaction that required hospitalization No Has patient had a PCN reaction occurring within the last 10 years: No If all of the above answers are "NO", then may proceed with Cephalosporin use.   . Prednisone Other (See Comments)    ulcers  . Statins Other (See Comments)    Aches and pains  . Morphine And Related Other (See Comments)     Had hallucinations with morphine sulfate in the past    . Pristiq [Desvenlafaxine] Other (See Comments)    Drowsiness and hangover sensation    Medications: Patient's Medications  New Prescriptions   No medications on file  Previous Medications   ACETAMINOPHEN (TYLENOL) 500 MG TABLET    Take 500-1,000 mg by mouth every 6 (six) hours as needed for moderate pain.    AMIODARONE (PACERONE) 200 MG TABLET    TAKE ONE-HALF TABLET DAILY   DULOXETINE (CYMBALTA) 60 MG CAPSULE    Take 1 capsule (60 mg total) by mouth daily.   ELIQUIS 5 MG TABS TABLET    Take 1 tablet (5 mg total) by mouth 2 (two) times daily.   LORAZEPAM (ATIVAN) 1 MG TABLET     TAKE ONE TABLET EVERY EIGHT HOURS AS NEEDED FOR ANXIETY   LOSARTAN (COZAAR) 50 MG TABLET    TAKE ONE TABLET TWICE DAILY   MECLIZINE (ANTIVERT) 12.5 MG TABLET    TAKE ONE OR TWO TABLETS EVERY EIGHT HOURS AS NEEDED FOR DIZZINESS   MULTIPLE VITAMIN (MULTIVITAMIN WITH MINERALS) TABS TABLET    Take 1 tablet by mouth daily.   MULTIPLE VITAMINS-MINERALS (PRESERVISION AREDS) TABS    Take 1 tablet by mouth 2 (  two) times daily.    NIACIN 500 MG CR CAPSULE    Take 500 mg by mouth 2 (two) times daily.    PANTOPRAZOLE (PROTONIX) 40 MG TABLET    TAKE ONE TABLET TWICE DAILY   SODIUM CHLORIDE (OCEAN) 0.65 % SOLN NASAL SPRAY    Place 1 spray into both nostrils at bedtime.    TETRAHYDROZOLINE HCL (VISINE OP)    Place 2 drops into both eyes 2 (two) times daily as needed (for dry eyes).    TRAMADOL (ULTRAM) 50 MG TABLET    Take one tablet by mouth every eight hours as needed for moderate to severe pain   TRIAMCINOLONE CREAM (KENALOG) 0.1 %    Apply 1 application topically 2 (two) times daily as needed (dry skin).   ZOLPIDEM (AMBIEN) 10 MG TABLET    TAKE 1 TABLET AT BEDTIME AS NEEDED FOR SLEEP  Modified Medications   No medications on file  Discontinued Medications   No medications on file    Review of Systems  Unable to perform ROS: Psychiatric disorder (severe depression)    Vitals:   01/29/17 1149  BP: (!) 150/72  Pulse: 68  Resp: 14  Temp: 98.1 F (36.7 C)  TempSrc: Oral  SpO2: 96%  Weight: 149 lb 12.8 oz (67.9 kg)  Height: 5\' 7"  (1.702 m)   Body mass index is 23.46 kg/m.  Physical Exam  Constitutional: She appears well-developed and well-nourished.  Cardiovascular: Normal rate, regular rhythm and intact distal pulses.  Exam reveals no gallop and no friction rub.   Murmur (1/6 SEM) heard. Trace LE edema b/l. No calf TTP  Pulmonary/Chest: Effort normal and breath sounds normal. No respiratory distress. She has no wheezes. She has no rales. She exhibits no tenderness.  Musculoskeletal: She  exhibits edema.  Neurological: She is alert.  Skin: Skin is dry. No rash noted.  Psychiatric: Her behavior is normal. Thought content normal. Her mood appears anxious.     Labs reviewed: No visits with results within 3 Month(s) from this visit.  Latest known visit with results is:  Office Visit on 10/16/2016  Component Date Value Ref Range Status  . Sodium 10/16/2016 138  135 - 146 mmol/L Final  . Potassium 10/16/2016 4.1  3.5 - 5.3 mmol/L Final  . Chloride 10/16/2016 101  98 - 110 mmol/L Final  . CO2 10/16/2016 26  20 - 31 mmol/L Final  . Glucose, Bld 10/16/2016 113* 65 - 99 mg/dL Final  . BUN 10/16/2016 13  7 - 25 mg/dL Final  . Creat 10/16/2016 0.73  0.60 - 0.88 mg/dL Final   Comment:   For patients > or = 81 years of age: The upper reference limit for Creatinine is approximately 13% higher for people identified as African-American.     . Total Bilirubin 10/16/2016 0.4  0.2 - 1.2 mg/dL Final  . Alkaline Phosphatase 10/16/2016 91  33 - 130 U/L Final  . AST 10/16/2016 16  10 - 35 U/L Final  . ALT 10/16/2016 25  6 - 29 U/L Final  . Total Protein 10/16/2016 6.7  6.1 - 8.1 g/dL Final  . Albumin 10/16/2016 4.1  3.6 - 5.1 g/dL Final  . Calcium 10/16/2016 9.2  8.6 - 10.4 mg/dL Final  . GFR, Est African American 10/16/2016 86  >=60 mL/min Final  . GFR, Est Non African American 10/16/2016 74  >=60 mL/min Final    Ct Head Wo Contrast  Result Date: 01/15/2017 CLINICAL DATA:  Pain following fall  EXAM: CT HEAD WITHOUT CONTRAST CT CERVICAL SPINE WITHOUT CONTRAST TECHNIQUE: Multidetector CT imaging of the head and cervical spine was performed following the standard protocol without intravenous contrast. Multiplanar CT image reconstructions of the cervical spine were also generated. COMPARISON:  Head CT September 28, 2009 ; brain MRI September 28, 2009 FINDINGS: CT HEAD FINDINGS Brain: Mild diffuse atrophy is stable. Right lateral ventricle is larger than the left lateral ventricle, a stable anatomic  variant. There is no evident intracranial mass, hemorrhage, extra-axial fluid collection, or midline shift. There is small vessel disease throughout the centra semiovale bilaterally. There is evidence of a prior infarct in the lateral right thalamus, noted to have been acute on 2011 MR study. On the current examination, there is no evidence suggesting acute infarct. Vascular: There is no appreciable hyperdense vessel. There is calcification in both distal vertebral arteries and basilar artery as well as in the carotid siphon regions bilaterally. Skull: Bony calvarium appears intact. Sinuses/Orbits: There is mucosal thickening and opacification in several ethmoid air cells bilaterally. Other paranasal sinuses are clear. Orbits appear symmetric bilaterally. Other: Mastoid air cells are clear. CT CERVICAL SPINE FINDINGS Alignment: There is no appreciable spondylolisthesis. Skull base and vertebrae: Skull base and craniocervical junction regions appear normal. There is moderate pannus with calcification posterior to the odontoid which is not causing appreciable impression on the craniocervical junction. There is no appreciable fracture. There are no blastic or lytic bone lesions. Soft tissues and spinal canal: Prevertebral soft tissues and predental space regions are normal. There are foci of nuchal ligament calcification posteriorly. There is no paraspinous lesion. No cord or canal hematoma is evident. Disc levels: There is moderately severe disc space narrowing at C5-6 and C6-7. There is moderate disc space narrowing at C3-4, C4-5, C7-T1, and T1-2. There is multilevel facet osteoarthritic change. There is no appreciable nerve root edema or effacement. No disc extrusion or stenosis evident. Upper chest: Visualized upper lung zones are clear. Other: There are foci of carotid artery calcification bilaterally. There is a subcentimeter nodular lesion in the left lobe of the thyroid. IMPRESSION: CT head: Atrophy with fairly  extensive periventricular small vessel disease. Prior infarct lateral right thalamus. No acute infarct evident. No mass, hemorrhage, or extra-axial fluid collection. There are foci of arterial vascular calcification. There is ethmoid air cell disease bilaterally. CT cervical spine: No fracture or spondylolisthesis. There is multilevel arthropathy. No disc extrusion or stenosis. There are foci of carotid artery calcification bilaterally. A subcentimeter nodular opacity in the left lobe of the thyroid does not meet sufficient size to warrant additional imaging surveillance per consensus guidelines. Electronically Signed   By: Lowella Grip III M.D.   On: 01/15/2017 17:53   Ct Cervical Spine Wo Contrast  Result Date: 01/15/2017 CLINICAL DATA:  Pain following fall EXAM: CT HEAD WITHOUT CONTRAST CT CERVICAL SPINE WITHOUT CONTRAST TECHNIQUE: Multidetector CT imaging of the head and cervical spine was performed following the standard protocol without intravenous contrast. Multiplanar CT image reconstructions of the cervical spine were also generated. COMPARISON:  Head CT September 28, 2009 ; brain MRI September 28, 2009 FINDINGS: CT HEAD FINDINGS Brain: Mild diffuse atrophy is stable. Right lateral ventricle is larger than the left lateral ventricle, a stable anatomic variant. There is no evident intracranial mass, hemorrhage, extra-axial fluid collection, or midline shift. There is small vessel disease throughout the centra semiovale bilaterally. There is evidence of a prior infarct in the lateral right thalamus, noted to have been acute on 2011 MR  study. On the current examination, there is no evidence suggesting acute infarct. Vascular: There is no appreciable hyperdense vessel. There is calcification in both distal vertebral arteries and basilar artery as well as in the carotid siphon regions bilaterally. Skull: Bony calvarium appears intact. Sinuses/Orbits: There is mucosal thickening and opacification in several ethmoid  air cells bilaterally. Other paranasal sinuses are clear. Orbits appear symmetric bilaterally. Other: Mastoid air cells are clear. CT CERVICAL SPINE FINDINGS Alignment: There is no appreciable spondylolisthesis. Skull base and vertebrae: Skull base and craniocervical junction regions appear normal. There is moderate pannus with calcification posterior to the odontoid which is not causing appreciable impression on the craniocervical junction. There is no appreciable fracture. There are no blastic or lytic bone lesions. Soft tissues and spinal canal: Prevertebral soft tissues and predental space regions are normal. There are foci of nuchal ligament calcification posteriorly. There is no paraspinous lesion. No cord or canal hematoma is evident. Disc levels: There is moderately severe disc space narrowing at C5-6 and C6-7. There is moderate disc space narrowing at C3-4, C4-5, C7-T1, and T1-2. There is multilevel facet osteoarthritic change. There is no appreciable nerve root edema or effacement. No disc extrusion or stenosis evident. Upper chest: Visualized upper lung zones are clear. Other: There are foci of carotid artery calcification bilaterally. There is a subcentimeter nodular lesion in the left lobe of the thyroid. IMPRESSION: CT head: Atrophy with fairly extensive periventricular small vessel disease. Prior infarct lateral right thalamus. No acute infarct evident. No mass, hemorrhage, or extra-axial fluid collection. There are foci of arterial vascular calcification. There is ethmoid air cell disease bilaterally. CT cervical spine: No fracture or spondylolisthesis. There is multilevel arthropathy. No disc extrusion or stenosis. There are foci of carotid artery calcification bilaterally. A subcentimeter nodular opacity in the left lobe of the thyroid does not meet sufficient size to warrant additional imaging surveillance per consensus guidelines. Electronically Signed   By: Lowella Grip III M.D.   On:  01/15/2017 17:53     Assessment/Plan   ICD-10-CM   1. Severe episode of recurrent major depressive disorder, without psychotic features ([place]) F33.2 DULoxetine (CYMBALTA) 60 MG capsule   improving but still not controlled  2. GAD (generalized anxiety disorder) F41.1 DULoxetine (CYMBALTA) 60 MG capsule   improving but still uncontrolled  3. Chronic pain of right knee M25.561 traMADol (ULTRAM) 50 MG tablet   G89.29   4. Primary osteoarthritis involving multiple joints M15.0 traMADol (ULTRAM) 50 MG tablet  5. Falls frequently R29.6   6. Abnormality of gait due to impairment of balance R26.89   7. Need for immunization against influenza Z23 Flu Vaccine QUAD 6+ mos PF IM (Fluarix Quad PF)   Continue current medications as ordered  Continue Home Health PT/OT, RN  Influenza vaccine given today  Follow up in 1 month for depression/anxiety, chronic pain   Helga Asbury S. Perlie Gold  [facility] [address] [place], [place] [address] [phone] Cell (Monday-Friday 8 AM - 5 PM) [phone] After 5 PM and follow prompts

## 2017-01-30 DIAGNOSIS — I252 Old myocardial infarction: Secondary | ICD-10-CM | POA: Diagnosis not present

## 2017-01-30 DIAGNOSIS — I480 Paroxysmal atrial fibrillation: Secondary | ICD-10-CM | POA: Diagnosis not present

## 2017-01-30 DIAGNOSIS — R2689 Other abnormalities of gait and mobility: Secondary | ICD-10-CM | POA: Diagnosis not present

## 2017-01-30 DIAGNOSIS — I10 Essential (primary) hypertension: Secondary | ICD-10-CM | POA: Diagnosis not present

## 2017-01-30 DIAGNOSIS — F418 Other specified anxiety disorders: Secondary | ICD-10-CM | POA: Diagnosis not present

## 2017-01-30 DIAGNOSIS — I2510 Atherosclerotic heart disease of native coronary artery without angina pectoris: Secondary | ICD-10-CM | POA: Diagnosis not present

## 2017-01-31 ENCOUNTER — Telehealth: Payer: Self-pay

## 2017-01-31 NOTE — Telephone Encounter (Signed)
Flonnie Hailstone with [facility] called to inform provider of a missed appointment with patient. Remo Lipps stated that patient's daughter called and stated that patient is very overwhelmed so she would like to begin therapy next week.   Remo Lipps asked for a verbal order to delay OT evaluation until 02-03-18. Verbal order given.

## 2017-02-03 DIAGNOSIS — R2689 Other abnormalities of gait and mobility: Secondary | ICD-10-CM | POA: Diagnosis not present

## 2017-02-03 DIAGNOSIS — I252 Old myocardial infarction: Secondary | ICD-10-CM | POA: Diagnosis not present

## 2017-02-03 DIAGNOSIS — I480 Paroxysmal atrial fibrillation: Secondary | ICD-10-CM | POA: Diagnosis not present

## 2017-02-03 DIAGNOSIS — I10 Essential (primary) hypertension: Secondary | ICD-10-CM | POA: Diagnosis not present

## 2017-02-03 DIAGNOSIS — F418 Other specified anxiety disorders: Secondary | ICD-10-CM | POA: Diagnosis not present

## 2017-02-03 DIAGNOSIS — I2510 Atherosclerotic heart disease of native coronary artery without angina pectoris: Secondary | ICD-10-CM | POA: Diagnosis not present

## 2017-02-10 DIAGNOSIS — I10 Essential (primary) hypertension: Secondary | ICD-10-CM | POA: Diagnosis not present

## 2017-02-10 DIAGNOSIS — R2689 Other abnormalities of gait and mobility: Secondary | ICD-10-CM | POA: Diagnosis not present

## 2017-02-10 DIAGNOSIS — I252 Old myocardial infarction: Secondary | ICD-10-CM | POA: Diagnosis not present

## 2017-02-10 DIAGNOSIS — I480 Paroxysmal atrial fibrillation: Secondary | ICD-10-CM | POA: Diagnosis not present

## 2017-02-10 DIAGNOSIS — F418 Other specified anxiety disorders: Secondary | ICD-10-CM | POA: Diagnosis not present

## 2017-02-10 DIAGNOSIS — I2510 Atherosclerotic heart disease of native coronary artery without angina pectoris: Secondary | ICD-10-CM | POA: Diagnosis not present

## 2017-02-11 DIAGNOSIS — I252 Old myocardial infarction: Secondary | ICD-10-CM | POA: Diagnosis not present

## 2017-02-11 DIAGNOSIS — R2689 Other abnormalities of gait and mobility: Secondary | ICD-10-CM | POA: Diagnosis not present

## 2017-02-11 DIAGNOSIS — F418 Other specified anxiety disorders: Secondary | ICD-10-CM | POA: Diagnosis not present

## 2017-02-11 DIAGNOSIS — I2510 Atherosclerotic heart disease of native coronary artery without angina pectoris: Secondary | ICD-10-CM | POA: Diagnosis not present

## 2017-02-11 DIAGNOSIS — I480 Paroxysmal atrial fibrillation: Secondary | ICD-10-CM | POA: Diagnosis not present

## 2017-02-11 DIAGNOSIS — I10 Essential (primary) hypertension: Secondary | ICD-10-CM | POA: Diagnosis not present

## 2017-02-13 DIAGNOSIS — I10 Essential (primary) hypertension: Secondary | ICD-10-CM | POA: Diagnosis not present

## 2017-02-13 DIAGNOSIS — F418 Other specified anxiety disorders: Secondary | ICD-10-CM | POA: Diagnosis not present

## 2017-02-13 DIAGNOSIS — R2689 Other abnormalities of gait and mobility: Secondary | ICD-10-CM | POA: Diagnosis not present

## 2017-02-13 DIAGNOSIS — I2510 Atherosclerotic heart disease of native coronary artery without angina pectoris: Secondary | ICD-10-CM | POA: Diagnosis not present

## 2017-02-13 DIAGNOSIS — I252 Old myocardial infarction: Secondary | ICD-10-CM | POA: Diagnosis not present

## 2017-02-13 DIAGNOSIS — I480 Paroxysmal atrial fibrillation: Secondary | ICD-10-CM | POA: Diagnosis not present

## 2017-02-14 DIAGNOSIS — F418 Other specified anxiety disorders: Secondary | ICD-10-CM | POA: Diagnosis not present

## 2017-02-14 DIAGNOSIS — I480 Paroxysmal atrial fibrillation: Secondary | ICD-10-CM | POA: Diagnosis not present

## 2017-02-14 DIAGNOSIS — R2689 Other abnormalities of gait and mobility: Secondary | ICD-10-CM | POA: Diagnosis not present

## 2017-02-14 DIAGNOSIS — I2510 Atherosclerotic heart disease of native coronary artery without angina pectoris: Secondary | ICD-10-CM | POA: Diagnosis not present

## 2017-02-14 DIAGNOSIS — I252 Old myocardial infarction: Secondary | ICD-10-CM | POA: Diagnosis not present

## 2017-02-14 DIAGNOSIS — I10 Essential (primary) hypertension: Secondary | ICD-10-CM | POA: Diagnosis not present

## 2017-02-18 NOTE — Progress Notes (Deleted)
CARDIOLOGY OFFICE NOTE  Date:  02/18/2017    [name] Date of Birth: Jun 11, 1926 Medical Record #[id number]  PCP:  Gildardo Cranker, DO  Cardiologist:  Servando Snare & ***    No chief complaint on file.   History of Present Illness: Marissa Bowen is a 83 y.o. female who presents today for a ***   Comes in today. Here with   Past Medical History:  Diagnosis Date  . Anemia   . Anxiety   . BACK PAIN, LUMBAR 09/27/2009  . CAD (coronary artery disease)    a. s/p remote CABG 1997, 3V.;  b. LexiScan Myoview (8/15): No ischemia, EF 72%, normal study;  c. Echo 12/13 - EF 60-65%, no significant valvular abnormalities, normal RV size and systolic function.   . CVA (cerebral infarction)    a. Thalamic infarct 09/2009. //  b. hx of TIA in 2008  . Gastric ulcer   . GERD 11/25/2006  . GI bleed    a. 09/2009: secondary to antral ulcer.  . Hiatal hernia   . History of diverticulitis Dx 02/2012  . History of MI (myocardial infarction)   . HYPERLIPIDEMIA   . HYPERTENSION   . IBS 12/07/2008  . MIGRAINE WITH AURA 08/13/2007  . Mild carotid artery disease ([place])    a. 0-39% bilat 05/2011 (f/u recommended 05/2013). //  b. Carotid US 3/17 - Bilat ICA 1-39% >> FU prn  . Muscle weakness (generalized) 09/27/2009  . OSTEOARTHRITIS 08/13/2007  . PAF (paroxysmal atrial fibrillation) ([place]) 11/25/2006   a. Multaq >> changes to Amiodarone 2/2 $$  //  Eliquis (CHADS2-VASc = 6)  . Urinary, incontinence, stress female    wears Pessary    Past Surgical History:  Procedure Laterality Date  . ABDOMINAL HYSTERECTOMY  1973  . CATARACT EXTRACTION W/ INTRAOCULAR LENS  IMPLANT, BILATERAL Bilateral 1999  . CHOLECYSTECTOMY N/A 01/03/2015   Procedure: LAPAROSCOPIC CHOLECYSTECTOMY WITH INTRAOPERATIVE CHOLANGIOGRAM;  Surgeon: Donnie Mesa, MD;  Location: [facility];  Service: General;  Laterality: N/A;  . CORONARY ANGIOPLASTY  1997   Dr Lia Foyer  . CORONARY ARTERY BYPASS GRAFT  1997   Dr Melvenia Needles  . LAPAROSCOPIC  CHOLECYSTECTOMY  01/03/2015  . TONSILLECTOMY       Medications: No outpatient prescriptions have been marked as taking for the 02/19/17 encounter (Appointment) with Burtis Junes, NP.     Allergies: Allergies  Allergen Reactions  . Asa [Aspirin] Other (See Comments)    Bleeding ulcer  . Crestor [Rosuvastatin Calcium] Other (See Comments)    myalgia  . Hydrocodone Other (See Comments)    REACTION: migraines  . Ibuprofen Other (See Comments)    ulcers  . Oxycodone Hcl Other (See Comments)    REACTION: migraines  . Penicillins Swelling and Rash    Has patient had a PCN reaction causing immediate rash, facial/tongue/throat swelling, SOB or lightheadedness with hypotension: Yes Has patient had a PCN reaction causing severe rash involving mucus membranes or skin necrosis: No Has patient had a PCN reaction that required hospitalization No Has patient had a PCN reaction occurring within the last 10 years: No If all of the above answers are "NO", then may proceed with Cephalosporin use.   . Prednisone Other (See Comments)    ulcers  . Statins Other (See Comments)    Aches and pains  . Morphine And Related Other (See Comments)     Had hallucinations with morphine sulfate in the past    . Pristiq [Desvenlafaxine] Other (  See Comments)    Drowsiness and hangover sensation    Social History: The patient  reports that she has never smoked. She has never used smokeless tobacco. She reports that she does not drink alcohol or use drugs.   Family History: The patient's ***family history includes Coronary artery disease in her sister; Diabetes in her sister; Heart attack in her mother and sister; Heart disease in her sister; Heart failure in her father; Throat cancer in her sister.   Review of Systems: Please see the history of present illness.   Otherwise, the review of systems is positive for {NONE DEFAULTED:18576::"none"}.   All other systems are reviewed and negative.   Physical  Exam: VS:  There were no vitals taken for this visit. Marland Kitchen  BMI There is no height or weight on file to calculate BMI.  Wt Readings from Last 3 Encounters:  01/29/17 149 lb 12.8 oz (67.9 kg)  01/28/17 150 lb 3.2 oz (68.1 kg)  12/25/16 152 lb (68.9 kg)    General: Pleasant. Well developed, well nourished and in no acute distress.   HEENT: Normal.  Neck: Supple, no JVD, carotid bruits, or masses noted.  Cardiac: ***Regular rate and rhythm. No murmurs, rubs, or gallops. No edema.  Respiratory:  Lungs are clear to auscultation bilaterally with normal work of breathing.  GI: Soft and nontender.  MS: No deformity or atrophy. Gait and ROM intact.  Skin: Warm and dry. Color is normal.  Neuro:  Strength and sensation are intact and no gross focal deficits noted.  Psych: Alert, appropriate and with normal affect.   LABORATORY DATA:  EKG:  EKG {ACTION; IS/IS [id number]} ordered today. This demonstrates ***.  Lab Results  Component Value Date   WBC 14.1 (H) 10/05/2016   HGB 12.4 10/05/2016   HCT 37.6 10/05/2016   PLT 213 10/05/2016   GLUCOSE 113 (H) 10/16/2016   CHOL 175 01/29/2016   TRIG 111 01/29/2016   HDL 50 01/29/2016   LDLCALC 103 01/29/2016   ALT 25 10/16/2016   AST 16 10/16/2016   NA 138 10/16/2016   K 4.1 10/16/2016   CL 101 10/16/2016   CREATININE 0.73 10/16/2016   BUN 13 10/16/2016   CO2 26 10/16/2016   TSH 0.807 10/05/2016   INR 1.14 10/04/2016   HGBA1C 5.5 10/05/2016     BNP (last 3 results) No results for input(s): BNP in the last 8760 hours.  ProBNP (last 3 results) No results for input(s): PROBNP in the last 8760 hours.   Other Studies Reviewed Today:   Assessment/Plan:   Current medicines are reviewed with the patient today.  The patient does not have concerns regarding medicines other than what has been noted above.  The following changes have been made:  See above.  Labs/ tests ordered today include:   No orders of the defined types were  placed in this encounter.    Disposition:   FU with *** in {gen number 5-63:875643} {Days to years:10300}.   Patient is agreeable to this plan and will call if any problems develop in the interim.   SignedTruitt Merle, NP  02/18/2017 4:26 PM  [facility] [address] [place], [place]  [address] Phone: [phone] Fax: [phone]

## 2017-02-19 ENCOUNTER — Ambulatory Visit: Payer: Medicare Other | Admitting: Nurse Practitioner

## 2017-02-19 DIAGNOSIS — I2510 Atherosclerotic heart disease of native coronary artery without angina pectoris: Secondary | ICD-10-CM | POA: Diagnosis not present

## 2017-02-19 DIAGNOSIS — R2689 Other abnormalities of gait and mobility: Secondary | ICD-10-CM | POA: Diagnosis not present

## 2017-02-19 DIAGNOSIS — I10 Essential (primary) hypertension: Secondary | ICD-10-CM | POA: Diagnosis not present

## 2017-02-19 DIAGNOSIS — I252 Old myocardial infarction: Secondary | ICD-10-CM | POA: Diagnosis not present

## 2017-02-19 DIAGNOSIS — I480 Paroxysmal atrial fibrillation: Secondary | ICD-10-CM | POA: Diagnosis not present

## 2017-02-19 DIAGNOSIS — F418 Other specified anxiety disorders: Secondary | ICD-10-CM | POA: Diagnosis not present

## 2017-02-21 ENCOUNTER — Emergency Department (HOSPITAL_COMMUNITY): Payer: Medicare Other

## 2017-02-21 ENCOUNTER — Encounter (HOSPITAL_COMMUNITY): Payer: Self-pay

## 2017-02-21 ENCOUNTER — Inpatient Hospital Stay (HOSPITAL_COMMUNITY)
Admission: EM | Admit: 2017-02-21 | Discharge: 2017-02-25 | DRG: 069 | Disposition: A | Discharge status: Home / Self Care | Payer: Medicare Other | Attending: Internal Medicine | Admitting: Internal Medicine

## 2017-02-21 ENCOUNTER — Observation Stay (HOSPITAL_COMMUNITY): Payer: Medicare Other

## 2017-02-21 DIAGNOSIS — I4891 Unspecified atrial fibrillation: Secondary | ICD-10-CM | POA: Diagnosis present

## 2017-02-21 DIAGNOSIS — G459 Transient cerebral ischemic attack, unspecified: Secondary | ICD-10-CM | POA: Diagnosis not present

## 2017-02-21 DIAGNOSIS — I480 Paroxysmal atrial fibrillation: Secondary | ICD-10-CM | POA: Diagnosis present

## 2017-02-21 DIAGNOSIS — F411 Generalized anxiety disorder: Secondary | ICD-10-CM

## 2017-02-21 DIAGNOSIS — R29701 NIHSS score 1: Secondary | ICD-10-CM | POA: Diagnosis present

## 2017-02-21 DIAGNOSIS — Z951 Presence of aortocoronary bypass graft: Secondary | ICD-10-CM

## 2017-02-21 DIAGNOSIS — F0391 Unspecified dementia with behavioral disturbance: Secondary | ICD-10-CM | POA: Diagnosis not present

## 2017-02-21 DIAGNOSIS — Z885 Allergy status to narcotic agent status: Secondary | ICD-10-CM

## 2017-02-21 DIAGNOSIS — F29 Unspecified psychosis not due to a substance or known physiological condition: Secondary | ICD-10-CM | POA: Diagnosis not present

## 2017-02-21 DIAGNOSIS — I10 Essential (primary) hypertension: Secondary | ICD-10-CM | POA: Diagnosis present

## 2017-02-21 DIAGNOSIS — I69351 Hemiplegia and hemiparesis following cerebral infarction affecting right dominant side: Secondary | ICD-10-CM | POA: Diagnosis not present

## 2017-02-21 DIAGNOSIS — F418 Other specified anxiety disorders: Secondary | ICD-10-CM | POA: Diagnosis present

## 2017-02-21 DIAGNOSIS — Z808 Family history of malignant neoplasm of other organs or systems: Secondary | ICD-10-CM

## 2017-02-21 DIAGNOSIS — S199XXA Unspecified injury of neck, initial encounter: Secondary | ICD-10-CM | POA: Diagnosis not present

## 2017-02-21 DIAGNOSIS — Z886 Allergy status to analgesic agent status: Secondary | ICD-10-CM

## 2017-02-21 DIAGNOSIS — I252 Old myocardial infarction: Secondary | ICD-10-CM

## 2017-02-21 DIAGNOSIS — Z79899 Other long term (current) drug therapy: Secondary | ICD-10-CM

## 2017-02-21 DIAGNOSIS — Z8249 Family history of ischemic heart disease and other diseases of the circulatory system: Secondary | ICD-10-CM

## 2017-02-21 DIAGNOSIS — K219 Gastro-esophageal reflux disease without esophagitis: Secondary | ICD-10-CM | POA: Diagnosis present

## 2017-02-21 DIAGNOSIS — Z961 Presence of intraocular lens: Secondary | ICD-10-CM | POA: Diagnosis present

## 2017-02-21 DIAGNOSIS — G43909 Migraine, unspecified, not intractable, without status migrainosus: Secondary | ICD-10-CM | POA: Diagnosis present

## 2017-02-21 DIAGNOSIS — Z8711 Personal history of peptic ulcer disease: Secondary | ICD-10-CM

## 2017-02-21 DIAGNOSIS — Z880 Allergy status to penicillin: Secondary | ICD-10-CM

## 2017-02-21 DIAGNOSIS — K589 Irritable bowel syndrome without diarrhea: Secondary | ICD-10-CM | POA: Diagnosis present

## 2017-02-21 DIAGNOSIS — I2510 Atherosclerotic heart disease of native coronary artery without angina pectoris: Secondary | ICD-10-CM | POA: Diagnosis not present

## 2017-02-21 DIAGNOSIS — Z9841 Cataract extraction status, right eye: Secondary | ICD-10-CM

## 2017-02-21 DIAGNOSIS — G9340 Encephalopathy, unspecified: Secondary | ICD-10-CM | POA: Diagnosis not present

## 2017-02-21 DIAGNOSIS — Z888 Allergy status to other drugs, medicaments and biological substances status: Secondary | ICD-10-CM

## 2017-02-21 DIAGNOSIS — Z9842 Cataract extraction status, left eye: Secondary | ICD-10-CM

## 2017-02-21 DIAGNOSIS — E785 Hyperlipidemia, unspecified: Secondary | ICD-10-CM | POA: Diagnosis present

## 2017-02-21 DIAGNOSIS — R4701 Aphasia: Secondary | ICD-10-CM | POA: Diagnosis not present

## 2017-02-21 DIAGNOSIS — Z9049 Acquired absence of other specified parts of digestive tract: Secondary | ICD-10-CM

## 2017-02-21 DIAGNOSIS — Z90710 Acquired absence of both cervix and uterus: Secondary | ICD-10-CM

## 2017-02-21 DIAGNOSIS — S0990XA Unspecified injury of head, initial encounter: Secondary | ICD-10-CM | POA: Diagnosis not present

## 2017-02-21 DIAGNOSIS — R443 Hallucinations, unspecified: Secondary | ICD-10-CM | POA: Diagnosis not present

## 2017-02-21 DIAGNOSIS — M1990 Unspecified osteoarthritis, unspecified site: Secondary | ICD-10-CM | POA: Diagnosis present

## 2017-02-21 DIAGNOSIS — E876 Hypokalemia: Secondary | ICD-10-CM | POA: Diagnosis present

## 2017-02-21 DIAGNOSIS — Z833 Family history of diabetes mellitus: Secondary | ICD-10-CM

## 2017-02-21 DIAGNOSIS — Z9861 Coronary angioplasty status: Secondary | ICD-10-CM

## 2017-02-21 DIAGNOSIS — Z7901 Long term (current) use of anticoagulants: Secondary | ICD-10-CM

## 2017-02-21 DIAGNOSIS — R531 Weakness: Secondary | ICD-10-CM

## 2017-02-21 LAB — I-STAT CHEM 8, ED
BUN: 16 mg/dL (ref 6–20)
Calcium, Ion: 1.11 mmol/L — ABNORMAL LOW (ref 1.15–1.40)
Chloride: 97 mmol/L — ABNORMAL LOW (ref 101–111)
Creatinine, Ser: 0.5 mg/dL (ref 0.44–1.00)
Glucose, Bld: 122 mg/dL — ABNORMAL HIGH (ref 65–99)
HEMATOCRIT: 42 % (ref 36.0–46.0)
Hemoglobin: 14.3 g/dL (ref 12.0–15.0)
Potassium: 3.6 mmol/L (ref 3.5–5.1)
SODIUM: 138 mmol/L (ref 135–145)
TCO2: 30 mmol/L (ref 22–32)

## 2017-02-21 LAB — CBC
HCT: 42.3 % (ref 36.0–46.0)
HEMOGLOBIN: 14.2 g/dL (ref 12.0–15.0)
MCH: 30.3 pg (ref 26.0–34.0)
MCHC: 33.6 g/dL (ref 30.0–36.0)
MCV: 90.2 fL (ref 78.0–100.0)
PLATELETS: 210 10*3/uL (ref 150–400)
RBC: 4.69 MIL/uL (ref 3.87–5.11)
RDW: 13.3 % (ref 11.5–15.5)
WBC: 14.3 10*3/uL — ABNORMAL HIGH (ref 4.0–10.5)

## 2017-02-21 LAB — DIFFERENTIAL
BASOS ABS: 0 10*3/uL (ref 0.0–0.1)
BASOS PCT: 0 %
EOS ABS: 0 10*3/uL (ref 0.0–0.7)
Eosinophils Relative: 0 %
LYMPHS ABS: 0.6 10*3/uL — AB (ref 0.7–4.0)
Lymphocytes Relative: 4 %
Monocytes Absolute: 0.9 10*3/uL (ref 0.1–1.0)
Monocytes Relative: 7 %
NEUTROS PCT: 89 %
Neutro Abs: 12.8 10*3/uL — ABNORMAL HIGH (ref 1.7–7.7)

## 2017-02-21 LAB — COMPREHENSIVE METABOLIC PANEL
ALBUMIN: 4.1 g/dL (ref 3.5–5.0)
ALT: 18 U/L (ref 14–54)
AST: 33 U/L (ref 15–41)
Alkaline Phosphatase: 93 U/L (ref 38–126)
Anion gap: 14 (ref 5–15)
BUN: 16 mg/dL (ref 6–20)
CHLORIDE: 98 mmol/L — AB (ref 101–111)
CO2: 28 mmol/L (ref 22–32)
Calcium: 9.6 mg/dL (ref 8.9–10.3)
Creatinine, Ser: 0.53 mg/dL (ref 0.44–1.00)
GFR calc Af Amer: >60 mL/min (ref >60)
GFR calc non Af Amer: >60 mL/min (ref >60)
GLUCOSE: 123 mg/dL — AB (ref 65–99)
POTASSIUM: 3.7 mmol/L (ref 3.5–5.1)
SODIUM: 140 mmol/L (ref 135–145)
Total Bilirubin: 1.1 mg/dL (ref 0.3–1.2)
Total Protein: 7.4 g/dL (ref 6.5–8.1)

## 2017-02-21 LAB — PROTIME-INR
INR: 1.13
PROTHROMBIN TIME: 14.5 s (ref 11.4–15.2)

## 2017-02-21 LAB — CBG MONITORING, ED: GLUCOSE-CAPILLARY: 99 mg/dL (ref 65–99)

## 2017-02-21 LAB — I-STAT TROPONIN, ED: Troponin i, poc: 0.02 ng/mL (ref 0.00–0.08)

## 2017-02-21 LAB — APTT: APTT: 35 s (ref 24–36)

## 2017-02-21 MED ORDER — MECLIZINE HCL 25 MG PO TABS
12.5000 mg | ORAL_TABLET | Freq: Three times a day (TID) | ORAL | Status: DC | PRN
  Administered 2017-02-23: 12.5 mg via ORAL
  Filled 2017-02-21: qty 1

## 2017-02-21 MED ORDER — ACETAMINOPHEN 650 MG RE SUPP: 650.0000 mg | RECTAL | Status: DC | PRN

## 2017-02-21 MED ORDER — NIACIN ER 500 MG PO CPCR
500.0000 mg | ORAL_CAPSULE | Freq: Two times a day (BID) | ORAL | Status: DC
  Administered 2017-02-22 – 2017-02-25 (×8): 500 mg via ORAL
  Filled 2017-02-21 (×8): qty 1

## 2017-02-21 MED ORDER — ACETAMINOPHEN 325 MG PO TABS: 650.0000 mg | ORAL_TABLET | ORAL | Status: DC | PRN

## 2017-02-21 MED ORDER — PANTOPRAZOLE SODIUM 40 MG PO TBEC
40.0000 mg | DELAYED_RELEASE_TABLET | Freq: Two times a day (BID) | ORAL | Status: DC
  Administered 2017-02-22 – 2017-02-25 (×8): 40 mg via ORAL
  Filled 2017-02-21 (×8): qty 1

## 2017-02-21 MED ORDER — APIXABAN 5 MG PO TABS
5.0000 mg | ORAL_TABLET | Freq: Two times a day (BID) | ORAL | Status: DC
  Administered 2017-02-22 – 2017-02-25 (×8): 5 mg via ORAL
  Filled 2017-02-21 (×8): qty 1

## 2017-02-21 MED ORDER — TRAMADOL HCL 50 MG PO TABS
50.0000 mg | ORAL_TABLET | Freq: Four times a day (QID) | ORAL | Status: DC | PRN
  Administered 2017-02-23: 50 mg via ORAL
  Filled 2017-02-21: qty 1

## 2017-02-21 MED ORDER — LOSARTAN POTASSIUM 50 MG PO TABS
50.0000 mg | ORAL_TABLET | Freq: Two times a day (BID) | ORAL | Status: DC
  Administered 2017-02-22 – 2017-02-25 (×9): 50 mg via ORAL
  Filled 2017-02-21 (×8): qty 1

## 2017-02-21 MED ORDER — ACETAMINOPHEN 160 MG/5ML PO SOLN: 650.0000 mg | ORAL | Status: DC | PRN

## 2017-02-21 MED ORDER — PROSIGHT PO TABS
1.0000 | ORAL_TABLET | Freq: Two times a day (BID) | ORAL | Status: DC
  Administered 2017-02-22 – 2017-02-25 (×8): 1 via ORAL
  Filled 2017-02-21 (×8): qty 1

## 2017-02-21 MED ORDER — AMIODARONE HCL 200 MG PO TABS
100.0000 mg | ORAL_TABLET | Freq: Every day | ORAL | Status: DC
  Administered 2017-02-22: 09:00:00 via ORAL
  Administered 2017-02-23 – 2017-02-25 (×3): 100 mg via ORAL
  Filled 2017-02-21 (×4): qty 1

## 2017-02-21 MED ORDER — SODIUM CHLORIDE 0.9 % IV SOLN
INTRAVENOUS | Status: DC
  Administered 2017-02-22: 01:00:00 via INTRAVENOUS

## 2017-02-21 MED ORDER — LORAZEPAM 1 MG PO TABS
1.0000 mg | ORAL_TABLET | Freq: Three times a day (TID) | ORAL | Status: DC | PRN
  Administered 2017-02-22 – 2017-02-23 (×3): 1 mg via ORAL
  Filled 2017-02-21 (×3): qty 1

## 2017-02-21 MED ORDER — STROKE: EARLY STAGES OF RECOVERY BOOK
Freq: Once | Status: AC
  Administered 2017-02-22: 01:00:00
  Filled 2017-02-21: qty 1

## 2017-02-21 MED ORDER — DULOXETINE HCL 60 MG PO CPEP
60.0000 mg | ORAL_CAPSULE | Freq: Every day | ORAL | Status: DC
  Administered 2017-02-22 – 2017-02-25 (×5): 60 mg via ORAL
  Filled 2017-02-21 (×4): qty 1

## 2017-02-21 MED ORDER — SALINE SPRAY 0.65 % NA SOLN
1.0000 | Freq: Every day | NASAL | Status: DC
  Administered 2017-02-22 – 2017-02-24 (×4): 1 via NASAL
  Filled 2017-02-21: qty 44

## 2017-02-21 MED ORDER — ADULT MULTIVITAMIN W/MINERALS CH
1.0000 | ORAL_TABLET | Freq: Every day | ORAL | Status: DC
  Administered 2017-02-22 – 2017-02-25 (×4): 1 via ORAL
  Filled 2017-02-21 (×5): qty 1

## 2017-02-21 MED ORDER — ZOLPIDEM TARTRATE 5 MG PO TABS: 5.0000 mg | ORAL_TABLET | Freq: Every evening | ORAL | Status: DC | PRN

## 2017-02-21 MED ORDER — TETRAHYDROZOLINE HCL 0.05 % OP SOLN
1.0000 [drp] | Freq: Two times a day (BID) | OPHTHALMIC | Status: DC
  Administered 2017-02-22 – 2017-02-25 (×8): 1 [drp] via OPHTHALMIC
  Filled 2017-02-21: qty 15

## 2017-02-21 NOTE — ED Notes (Signed)
Myself and EMT assisted PT on bedpan.  PT will notify when done.

## 2017-02-21 NOTE — ED Notes (Signed)
Bed: OI51 Expected date:  Expected time:  Means of arrival:  Comments: (dirty)83 yo found on floor

## 2017-02-21 NOTE — ED Provider Notes (Signed)
[facility] EMERGENCY DEPARTMENT Provider Note   CSN: [id number] Arrival date & time: 02/21/17  1731     History   Chief Complaint Chief Complaint  Patient presents with  . Fall    HPI Marissa Bowen is a 83 y.o. female.  Patient presents from [facility] emergency department for possible code stroke.  Patient felt that something was not right today she feels since this morning she is felt weaker than normal and fatigued.  Possibly weaker on the right side.  No head injuries recalled.  Patient is on Eliquis.      Past Medical History:  Diagnosis Date  . Anemia   . Anxiety   . BACK PAIN, LUMBAR 09/27/2009  . CAD (coronary artery disease)    a. s/p remote CABG 1997, 3V.;  b. LexiScan Myoview (8/15): No ischemia, EF 72%, normal study;  c. Echo 12/13 - EF 60-65%, no significant valvular abnormalities, normal RV size and systolic function.   . CVA (cerebral infarction)    a. Thalamic infarct 09/2009. //  b. hx of TIA in 2008  . Gastric ulcer   . GERD 11/25/2006  . GI bleed    a. 09/2009: secondary to antral ulcer.  . Hiatal hernia   . History of diverticulitis Dx 02/2012  . History of MI (myocardial infarction)   . HYPERLIPIDEMIA   . HYPERTENSION   . IBS 12/07/2008  . MIGRAINE WITH AURA 08/13/2007  . Mild carotid artery disease ([place])    a. 0-39% bilat 05/2011 (f/u recommended 05/2013). //  b. Carotid US 3/17 - Bilat ICA 1-39% >> FU prn  . Muscle weakness (generalized) 09/27/2009  . OSTEOARTHRITIS 08/13/2007  . PAF (paroxysmal atrial fibrillation) ([place]) 11/25/2006   a. Multaq >> changes to Amiodarone 2/2 $$  //  Eliquis (CHADS2-VASc = 6)  . Urinary, incontinence, stress female    wears Pessary    Patient Active Problem List   Diagnosis Date Noted  . GAD (generalized anxiety disorder) 11/19/2016  . Diverticulitis 10/05/2016  . Chest pain 10/04/2016  . Elevated LFTs 10/04/2016  . SIRS (systemic inflammatory response syndrome) ([place]) 10/04/2016  . Macular  degeneration of both eyes 09/22/2015  . Carotid stenosis 08/10/2015  . On amiodarone therapy 08/10/2015  . Chronic cholecystitis with calculus 01/03/2015  . Allergic rhinitis 08/05/2014  . Essential hypertension 05/03/2014  . Osteoarthritis 09/29/2013  . Routine general medical examination at a health care facility 09/29/2013  . Nausea alone 08/03/2013  . Depression with anxiety 08/03/2013  . Atrial fibrillation ([place]) 09/02/2012  . Anemia due to chronic blood loss 08/18/2012  . Intractable nausea and vomiting 06/08/2012  . Diarrhea 06/08/2012  . Leukocytosis 06/08/2012  . Orthostatic hypotension 06/08/2012  . PALPITATIONS, HX OF 01/04/2010  . ACUTE POSTHEMORRHAGIC ANEMIA 10/23/2009  . BACK PAIN, LUMBAR 09/27/2009  . MUSCLE WEAKNESS (GENERALIZED) 09/27/2009  . IBS 12/07/2008  . Fatigue 07/21/2008  . MIGRAINE WITH AURA 08/13/2007  . OSTEOARTHRITIS 08/13/2007  . TRANSIENT ISCHEMIC ATTACK 04/13/2007  . Hyperlipidemia LDL goal <100 11/25/2006  . CAD (coronary artery disease) 11/25/2006  . GERD 11/25/2006  . DIVERTICULITIS, HX OF 11/25/2006    Past Surgical History:  Procedure Laterality Date  . ABDOMINAL HYSTERECTOMY  1973  . CATARACT EXTRACTION W/ INTRAOCULAR LENS  IMPLANT, BILATERAL Bilateral 1999  . CHOLECYSTECTOMY N/A 01/03/2015   Procedure: LAPAROSCOPIC CHOLECYSTECTOMY WITH INTRAOPERATIVE CHOLANGIOGRAM;  Surgeon: Donnie Mesa, MD;  Location: [facility];  Service: General;  Laterality: N/A;  . [place]  Dr Lia Foyer  . CORONARY ARTERY BYPASS GRAFT  1997   Dr Melvenia Needles  . LAPAROSCOPIC CHOLECYSTECTOMY  01/03/2015  . TONSILLECTOMY      OB History    No data available       Home Medications    Prior to Admission medications   Medication Sig Start Date End Date Taking? Authorizing Provider  acetaminophen (TYLENOL) 500 MG tablet Take 500-1,000 mg by mouth every 6 (six) hours as needed for moderate pain.    Yes [provider]  amiodarone (PACERONE)  200 MG tablet TAKE ONE-HALF TABLET DAILY Patient taking differently: TAKE 100 mg TABLET DAILY 01/08/17  Yes Gildardo Cranker, DO  DULoxetine (CYMBALTA) 60 MG capsule Take 1 capsule (60 mg total) by mouth daily. For mood and chronic pain 01/29/17  Yes Carter, Monica, DO  ELIQUIS 5 MG TABS tablet Take 1 tablet (5 mg total) by mouth 2 (two) times daily. 07/26/16  Yes Josue Hector, MD  LORazepam (ATIVAN) 1 MG tablet TAKE ONE TABLET EVERY EIGHT HOURS AS NEEDED FOR ANXIETY 01/21/17  Yes Eulas Post, Monica, DO  losartan (COZAAR) 50 MG tablet TAKE ONE TABLET TWICE DAILY Patient taking differently: TAKE 50 mg  TABLET TWICE DAILY 09/11/16  Yes Josue Hector, MD  meclizine (ANTIVERT) 12.5 MG tablet TAKE ONE OR TWO TABLETS EVERY EIGHT HOURS AS NEEDED FOR DIZZINESS 01/21/17  Yes Gildardo Cranker, DO  Multiple Vitamin (MULTIVITAMIN WITH MINERALS) TABS tablet Take 1 tablet by mouth daily.   Yes [provider]  Multiple Vitamins-Minerals (PRESERVISION AREDS) TABS Take 1 tablet by mouth 2 (two) times daily.    Yes [provider]  niacin 500 MG CR capsule Take 500 mg by mouth 2 (two) times daily.    Yes [provider]  pantoprazole (PROTONIX) 40 MG tablet TAKE ONE TABLET TWICE DAILY Patient taking differently: TAKE 40 mg TABLET TWICE DAILY 09/17/16  Yes Gildardo Cranker, DO  sodium chloride (OCEAN) 0.65 % SOLN nasal spray Place 1 spray into both nostrils at bedtime.    Yes [provider]  Tetrahydrozoline HCl (VISINE OP) Place 2 drops into both eyes 2 (two) times daily as needed (for dry eyes).    Yes [provider]  traMADol (ULTRAM) 50 MG tablet Take one tablet by mouth every eight hours as needed for moderate to severe pain 01/29/17  Yes Gildardo Cranker, DO  zolpidem (AMBIEN) 10 MG tablet Take 1 tablet (10 mg total) by mouth at bedtime as needed. for sleep 01/29/17  Yes Gildardo Cranker, DO    Family History Family History  Problem Relation Age of Onset  . Heart attack Mother    . Heart failure Father   . Throat cancer Sister   . Heart disease Sister   . Coronary artery disease Sister   . Diabetes Sister   . Heart attack Sister   . Colon cancer Neg Hx     Social History Social History  Substance Use Topics  . Smoking status: Never Smoker  . Smokeless tobacco: Never Used  . Alcohol use No     Allergies   Asa [aspirin]; Crestor [rosuvastatin calcium]; Hydrocodone; Ibuprofen; Oxycodone hcl; Penicillins; Prednisone; Statins; Morphine and related; and Pristiq [desvenlafaxine]   Review of Systems Review of Systems  Constitutional: Positive for fatigue. Negative for chills and fever.  HENT: Negative for congestion.   Eyes: Negative for visual disturbance.  Respiratory: Negative for shortness of breath.   Cardiovascular: Negative for chest pain.  Gastrointestinal: Negative for abdominal pain and  vomiting.  Genitourinary: Negative for dysuria and flank pain.  Musculoskeletal: Negative for back pain, neck pain and neck stiffness.  Skin: Negative for rash.  Neurological: Positive for weakness. Negative for light-headedness and headaches.     Physical Exam Updated Vital Signs BP (!) 166/88 (BP Location: Right Arm)   Pulse 74   Temp 97.6 F (36.4 C) (Oral)   Resp 18   Wt 67.6 kg (149 lb)   SpO2 97%   BMI 23.34 kg/m   Physical Exam  Constitutional: She is oriented to person, place, and time. She appears well-developed and well-nourished.  HENT:  Head: Normocephalic and atraumatic.  Eyes: Conjunctivae are normal. Right eye exhibits no discharge. Left eye exhibits no discharge.  Neck: Normal range of motion. Neck supple. No tracheal deviation present.  Cardiovascular: Normal rate and regular rhythm.   Pulmonary/Chest: Effort normal and breath sounds normal.  Abdominal: Soft. She exhibits no distension. There is no tenderness. There is no guarding.  Musculoskeletal: She exhibits no edema.  Neurological: She is alert and oriented to person, place,  and time. No cranial nerve deficit.  Patient has general mild weakness on exam.  No focal deficits.  Patient has 5+ strength in both upper and lower extremity flexion.  Gross sensation intact bilateral palpation.  Skin: Skin is warm. No rash noted.  Psychiatric: She has a normal mood and affect.  Nursing note and vitals reviewed.    ED Treatments / Results  Labs (all labs ordered are listed, but only abnormal results are displayed) Labs Reviewed  CBC - Abnormal; Notable for the following:       Result Value   WBC 14.3 (*)    All other components within normal limits  DIFFERENTIAL - Abnormal; Notable for the following:    Neutro Abs 12.8 (*)    Lymphs Abs 0.6 (*)    All other components within normal limits  COMPREHENSIVE METABOLIC PANEL - Abnormal; Notable for the following:    Chloride 98 (*)    Glucose, Bld 123 (*)    All other components within normal limits  I-STAT CHEM 8, ED - Abnormal; Notable for the following:    Chloride 97 (*)    Glucose, Bld 122 (*)    Calcium, Ion 1.11 (*)    All other components within normal limits  PROTIME-INR  APTT  ETHANOL  RAPID URINE DRUG SCREEN, HOSP PERFORMED  URINALYSIS, ROUTINE W REFLEX MICROSCOPIC  CK  I-STAT TROPONIN, ED  CBG MONITORING, ED    EKG  EKG Interpretation None       Radiology Ct Cervical Spine Wo Contrast  Result Date: 02/21/2017 CLINICAL DATA:  Fall.  Right-sided weakness. Initial encounter. EXAM: CT CERVICAL SPINE WITHOUT CONTRAST TECHNIQUE: Multidetector CT imaging of the cervical spine was performed without intravenous contrast. Multiplanar CT image reconstructions were also generated. COMPARISON:  None. FINDINGS: Alignment: No traumatic malalignment. Skull base and vertebrae: Negative for fracture. Soft tissues and spinal canal: No prevertebral fluid or swelling. No visible canal hematoma. Disc levels: Diffuse disc degeneration and facet spurring. No evidence cord impingement. Upper chest: No acute finding  IMPRESSION: No evidence of cervical spine injury. Electronically Signed   By: Monte Fantasia M.D.   On: 02/21/2017 18:43   Ct Head Code Stroke Wo Contrast  Result Date: 02/21/2017 CLINICAL DATA:  Code stroke. Aphasia. Right-sided weakness. Fall on Eliquis. EXAM: CT HEAD WITHOUT CONTRAST TECHNIQUE: Contiguous axial images were obtained from the base of the skull through the vertex without intravenous contrast.  COMPARISON:  01/15/2017 FINDINGS: Brain: No evidence of acute infarction, hemorrhage, hydrocephalus, extra-axial collection or mass lesion/mass effect. Advanced low-density throughout the cerebral white matter. Stable appearance of patchy low-density in the bilateral deep gray nuclei from small vessel ischemia and lacunar infarcts. Suspect chronic pontine small-vessel disease as well. Age normal brain volume. Vascular: Atherosclerotic calcification.  No hyperdense vessel. Skull: No acute finding Sinuses/Orbits: Bilateral cataract resection. ASPECTS [facility] Stroke Program Early CT Score) Not scored given the extent of chronic subcortical injury. These results were called by telephone at the time of interpretation on 02/21/2017 at 6:40 pm to Dr. Milton Ferguson , who verbally acknowledged these results. IMPRESSION: 1. No hemorrhage or acute cortical infarct. 2. Advanced chronic small vessel ischemia in the cerebral white matter and deep gray nuclei. A superimposed infarct would easily be obscured. Electronically Signed   By: Monte Fantasia M.D.   On: 02/21/2017 18:40    Procedures Procedures (including critical care time)  Medications Ordered in ED Medications - No data to display   Initial Impression / Assessment and Plan / ED Course  I have reviewed the triage vital signs and the nursing notes.  Pertinent labs & imaging results that were available during my care of the patient were reviewed by me and considered in my medical decision making (see chart for details).    Patient presents for  possible code stroke.  On further discussion symptoms and signs started this morning.  Neurology evaluated at bedside with myself.  Code stroke canceled.  Neurology recommends observation for TIA/mild encephalopathy.  The patients results and plan were reviewed and discussed.   Any x-rays performed were independently reviewed by myself.   Differential diagnosis were considered with the presenting HPI.  Medications - No data to display  Vitals:   02/21/17 1800 02/21/17 1830 02/21/17 1900 02/21/17 1934  BP: (!) 158/73 (!) 197/87 (!) 174/77 (!) 166/88  Pulse: 66 74 75 74  Resp: 17 13 14 18   Temp:      TempSrc:      SpO2: 96% 98% 100% 97%  Weight:        Final diagnoses:  Acute encephalopathy    Admission/ observation were discussed with the admitting physician, patient and/or family and they are comfortable with the plan.     Final Clinical Impressions(s) / ED Diagnoses   Final diagnoses:  Acute encephalopathy    New Prescriptions New Prescriptions   No medications on file     Elnora Morrison, MD 02/21/17 [phone]

## 2017-02-21 NOTE — ED Notes (Addendum)
Pt arrived to [facility] ED from [facility] ED via [facility]. Resp e/u; NAD. EDP at bedside.

## 2017-02-21 NOTE — ED Provider Notes (Signed)
[facility] Provider Note   CSN: [id number] Arrival date & time: 02/21/17  1731     History   Chief Complaint Chief Complaint  Patient presents with  . Fall    HPI Marissa Bowen is a 83 y.o. female.  Patient was found on the floor at home.  Patient was unable to ambulate.  She has weakness in both legs but worse in the right.  Patient also has been a little difficulty getting words out.  She was last seen normal at 10 PM yesterday   The history is provided by a relative. No language interpreter was used.  Fall  This is a new problem. The current episode started 12 to 24 hours ago. The problem occurs rarely. The problem has been resolved. Pertinent negatives include no chest pain. Nothing aggravates the symptoms. Nothing relieves the symptoms. She has tried nothing for the symptoms. The treatment provided no relief.    Past Medical History:  Diagnosis Date  . Anemia   . Anxiety   . BACK PAIN, LUMBAR 09/27/2009  . CAD (coronary artery disease)    a. s/p remote CABG 1997, 3V.;  b. LexiScan Myoview (8/15): No ischemia, EF 72%, normal study;  c. Echo 12/13 - EF 60-65%, no significant valvular abnormalities, normal RV size and systolic function.   . CVA (cerebral infarction)    a. Thalamic infarct 09/2009. //  b. hx of TIA in 2008  . Gastric ulcer   . GERD 11/25/2006  . GI bleed    a. 09/2009: secondary to antral ulcer.  . Hiatal hernia   . History of diverticulitis Dx 02/2012  . History of MI (myocardial infarction)   . HYPERLIPIDEMIA   . HYPERTENSION   . IBS 12/07/2008  . MIGRAINE WITH AURA 08/13/2007  . Mild carotid artery disease ([place])    a. 0-39% bilat 05/2011 (f/u recommended 05/2013). //  b. Carotid US 3/17 - Bilat ICA 1-39% >> FU prn  . Muscle weakness (generalized) 09/27/2009  . OSTEOARTHRITIS 08/13/2007  . PAF (paroxysmal atrial fibrillation) ([place]) 11/25/2006   a. Multaq >> changes to Amiodarone 2/2 $$  //  Eliquis (CHADS2-VASc = 6)  .  Urinary, incontinence, stress female    wears Pessary    Patient Active Problem List   Diagnosis Date Noted  . GAD (generalized anxiety disorder) 11/19/2016  . Diverticulitis 10/05/2016  . Chest pain 10/04/2016  . Elevated LFTs 10/04/2016  . SIRS (systemic inflammatory response syndrome) ([place]) 10/04/2016  . Macular degeneration of both eyes 09/22/2015  . Carotid stenosis 08/10/2015  . On amiodarone therapy 08/10/2015  . Chronic cholecystitis with calculus 01/03/2015  . Allergic rhinitis 08/05/2014  . Essential hypertension 05/03/2014  . Osteoarthritis 09/29/2013  . Routine general medical examination at a health care facility 09/29/2013  . Nausea alone 08/03/2013  . Depression with anxiety 08/03/2013  . Atrial fibrillation ([place]) 09/02/2012  . Anemia due to chronic blood loss 08/18/2012  . Intractable nausea and vomiting 06/08/2012  . Diarrhea 06/08/2012  . Leukocytosis 06/08/2012  . Orthostatic hypotension 06/08/2012  . PALPITATIONS, HX OF 01/04/2010  . ACUTE POSTHEMORRHAGIC ANEMIA 10/23/2009  . BACK PAIN, LUMBAR 09/27/2009  . MUSCLE WEAKNESS (GENERALIZED) 09/27/2009  . IBS 12/07/2008  . Fatigue 07/21/2008  . MIGRAINE WITH AURA 08/13/2007  . OSTEOARTHRITIS 08/13/2007  . TRANSIENT ISCHEMIC ATTACK 04/13/2007  . Hyperlipidemia LDL goal <100 11/25/2006  . CAD (coronary artery disease) 11/25/2006  . GERD 11/25/2006  . DIVERTICULITIS, HX OF 11/25/2006  Past Surgical History:  Procedure Laterality Date  . ABDOMINAL HYSTERECTOMY  1973  . CATARACT EXTRACTION W/ INTRAOCULAR LENS  IMPLANT, BILATERAL Bilateral 1999  . CHOLECYSTECTOMY N/A 01/03/2015   Procedure: LAPAROSCOPIC CHOLECYSTECTOMY WITH INTRAOPERATIVE CHOLANGIOGRAM;  Surgeon: Donnie Mesa, MD;  Location: [facility];  Service: General;  Laterality: N/A;  . CORONARY ANGIOPLASTY  1997   Dr Lia Foyer  . CORONARY ARTERY BYPASS GRAFT  1997   Dr Melvenia Needles  . LAPAROSCOPIC CHOLECYSTECTOMY  01/03/2015  . TONSILLECTOMY      OB History     No data available       Home Medications    Prior to Admission medications   Medication Sig Start Date End Date Taking? Authorizing Provider  acetaminophen (TYLENOL) 500 MG tablet Take 500-1,000 mg by mouth every 6 (six) hours as needed for moderate pain.     [provider]  amiodarone (PACERONE) 200 MG tablet TAKE ONE-HALF TABLET DAILY 01/08/17   Gildardo Cranker, DO  DULoxetine (CYMBALTA) 60 MG capsule Take 1 capsule (60 mg total) by mouth daily. For mood and chronic pain 01/29/17   Gildardo Cranker, DO  ELIQUIS 5 MG TABS tablet Take 1 tablet (5 mg total) by mouth 2 (two) times daily. 07/26/16   Josue Hector, MD  LORazepam (ATIVAN) 1 MG tablet TAKE ONE TABLET EVERY EIGHT HOURS AS NEEDED FOR ANXIETY 01/21/17   Gildardo Cranker, DO  losartan (COZAAR) 50 MG tablet TAKE ONE TABLET TWICE DAILY 09/11/16   Josue Hector, MD  meclizine (ANTIVERT) 12.5 MG tablet TAKE ONE OR TWO TABLETS EVERY EIGHT HOURS AS NEEDED FOR DIZZINESS 01/21/17   Gildardo Cranker, DO  Multiple Vitamin (MULTIVITAMIN WITH MINERALS) TABS tablet Take 1 tablet by mouth daily.    [provider]  Multiple Vitamins-Minerals (PRESERVISION AREDS) TABS Take 1 tablet by mouth 2 (two) times daily.     [provider]  niacin 500 MG CR capsule Take 500 mg by mouth 2 (two) times daily.     [provider]  pantoprazole (PROTONIX) 40 MG tablet TAKE ONE TABLET TWICE DAILY 09/17/16   Gildardo Cranker, DO  sodium chloride (OCEAN) 0.65 % SOLN nasal spray Place 1 spray into both nostrils at bedtime.     [provider]  Tetrahydrozoline HCl (VISINE OP) Place 2 drops into both eyes 2 (two) times daily as needed (for dry eyes).     [provider]  traMADol (ULTRAM) 50 MG tablet Take one tablet by mouth every eight hours as needed for moderate to severe pain 01/29/17   Gildardo Cranker, DO  triamcinolone cream (KENALOG) 0.1 % Apply 1 application topically 2 (two) times daily as needed (dry skin).     [provider]  zolpidem (AMBIEN) 10 MG tablet Take 1 tablet (10 mg total) by mouth at bedtime as needed. for sleep 01/29/17   Gildardo Cranker, DO    Family History Family History  Problem Relation Age of Onset  . Heart attack Mother   . Heart failure Father   . Throat cancer Sister   . Heart disease Sister   . Coronary artery disease Sister   . Diabetes Sister   . Heart attack Sister   . Colon cancer Neg Hx     Social History Social History  Substance Use Topics  . Smoking status: Never Smoker  . Smokeless tobacco: Never Used  . Alcohol use No     Allergies   Asa [aspirin]; Crestor [rosuvastatin calcium]; Hydrocodone; Ibuprofen; Oxycodone hcl; Penicillins; Prednisone;  Statins; Morphine and related; and Pristiq [desvenlafaxine]   Review of Systems Review of Systems  Unable to perform ROS: Mental status change  Cardiovascular: Negative for chest pain.     Physical Exam Updated Vital Signs BP (!) 197/87   Pulse 74   Temp 97.6 F (36.4 C) (Oral)   Resp 13   Wt 67.6 kg (149 lb)   SpO2 98%   BMI 23.34 kg/m   Physical Exam  Constitutional: She appears well-developed.  HENT:  Head: Normocephalic.  Eyes: Conjunctivae and EOM are normal. No scleral icterus.  Neck: Neck supple. No thyromegaly present.  Cardiovascular: Normal rate and regular rhythm.  Exam reveals no gallop and no friction rub.   No murmur heard. Pulmonary/Chest: No stridor. She has no wheezes. She has no rales. She exhibits no tenderness.  Abdominal: She exhibits no distension. There is no tenderness. There is no rebound.  Musculoskeletal: Normal range of motion. She exhibits no edema.  Lymphadenopathy:    She has no cervical adenopathy.  Neurological: She is alert. She exhibits normal muscle tone. Coordination normal.  Patient is alert oriented to person only.  She does think she is at the hospital but does not know what hospital.  Patient has weakness of both legs but worse in the right  side.  She also having some difficulty getting his words out  Skin: No rash noted. No erythema.     ED Treatments / Results  Labs (all labs ordered are listed, but only abnormal results are displayed) Labs Reviewed  CBC - Abnormal; Notable for the following:       Result Value   WBC 14.3 (*)    All other components within normal limits  DIFFERENTIAL - Abnormal; Notable for the following:    Neutro Abs 12.8 (*)    Lymphs Abs 0.6 (*)    All other components within normal limits  I-STAT CHEM 8, ED - Abnormal; Notable for the following:    Chloride 97 (*)    Glucose, Bld 122 (*)    Calcium, Ion 1.11 (*)    All other components within normal limits  ETHANOL  PROTIME-INR  APTT  COMPREHENSIVE METABOLIC PANEL  RAPID URINE DRUG SCREEN, HOSP PERFORMED  URINALYSIS, ROUTINE W REFLEX MICROSCOPIC  CK  I-STAT TROPONIN, ED    EKG  EKG Interpretation None       Radiology No results found.  Procedures Procedures (including critical care time)  Medications Ordered in ED Medications - No data to display   Initial Impression / Assessment and Plan / ED Course  I have reviewed the triage vital signs and the nursing notes.  Pertinent labs & imaging results that were available during my care of the patient were reviewed by me and considered in my medical decision making (see chart for details). CRITICAL CARE Performed by: Estle Huguley L Total critical care time: 45 minutes Critical care time was exclusive of separately billable procedures and treating other patients. Critical care was necessary to treat or prevent imminent or life-threatening deterioration. Critical care was time spent personally by me on the following activities: development of treatment plan with patient and/or surrogate as well as nursing, discussions with consultants, evaluation of patient's response to treatment, examination of patient, obtaining history from patient or surrogate, ordering and performing  treatments and interventions, ordering and review of laboratory studies, ordering and review of radiographic studies, pulse oximetry and re-evaluation of patient's condition.     I spoke to Dr. Leonel Ramsay neurology and he wants  patient transferred over to [facility] immediately for stroke workup  Final Clinical Impressions(s) / ED Diagnoses   Final diagnoses:  None    New Prescriptions New Prescriptions   No medications on file     Milton Ferguson, MD 02/21/17 [phone]

## 2017-02-21 NOTE — ED Notes (Signed)
Patient transported to CT 

## 2017-02-21 NOTE — ED Notes (Signed)
Attempted report x 1,  Bed assignment/floor had changed

## 2017-02-21 NOTE — ED Notes (Signed)
Cancel code stroke orders per Dr. Lorraine Lax

## 2017-02-21 NOTE — H&P (Addendum)
TRH H&P   Patient Demographics:    Marissa Bowen, is a 83 y.o. female  MRN: [id number]   DOB - October 21, 1926  Admit Date - 02/21/2017  Outpatient Primary MD for the patient is Gildardo Cranker, DO  Referring MD/NP/PA: Reather Converse  Outpatient Specialists:   Patient coming from: home  Chief Complaint  Patient presents with  . Fall      HPI:    Marissa Bowen  is a 83 y.o. female, w Pafib, CVA in 2011 with residual right leg weakness,  Migraines, CAD, apparently  c/o generalized weakness starting about 7am.  Pt denies headache, cp, palp, sob, numbness, tingling, incontinence. Pt was apparently found down by dauther this afternoon.  Pt presented to ED for evaluation of weakness, ?  In ED,  Wbc 14.3, Hgb 14.2, Plt 210 Bun 16, Creatinine 0.53  IMPRESSION: 1. No hemorrhage or acute cortical infarct. 2. Advanced chronic small vessel ischemia in the cerebral white matter and deep gray nuclei. A superimposed infarct would easily be obscured.  Pt will be admitted for TIA/ ? CVA     Review of systems:    In addition to the HPI above,  No Fever-chills, No Headache, No changes with Vision or hearing, No problems swallowing food or Liquids, No Chest pain, Cough or Shortness of Breath, No Abdominal pain, No Nausea or Vommitting, Bowel movements are regular, No Blood in stool or Urine, No dysuria, No new skin rashes or bruises, No new joints pains-aches,   No recent weight gain or loss, No polyuria, polydypsia or polyphagia, No significant Mental Stressors.  A full 10 point Review of Systems was done, except as stated above, all other Review of Systems were negative.   With Past History of the following :    Past Medical History:  Diagnosis Date  . Anemia   . Anxiety   . BACK PAIN, LUMBAR 09/27/2009  . CAD (coronary artery disease)    a. s/p remote CABG 1997, 3V.;  b. LexiScan  Myoview (8/15): No ischemia, EF 72%, normal study;  c. Echo 12/13 - EF 60-65%, no significant valvular abnormalities, normal RV size and systolic function.   . CVA (cerebral infarction)    a. Thalamic infarct 09/2009. //  b. hx of TIA in 2008  . Gastric ulcer   . GERD 11/25/2006  . GI bleed    a. 09/2009: secondary to antral ulcer.  . Hiatal hernia   . History of diverticulitis Dx 02/2012  . History of MI (myocardial infarction)   . HYPERLIPIDEMIA   . HYPERTENSION   . IBS 12/07/2008  . MIGRAINE WITH AURA 08/13/2007  . Mild carotid artery disease ([place])    a. 0-39% bilat 05/2011 (f/u recommended 05/2013). //  b. Carotid US 3/17 - Bilat ICA 1-39% >> FU prn  . Muscle weakness (generalized) 09/27/2009  . OSTEOARTHRITIS 08/13/2007  . PAF (paroxysmal atrial fibrillation) (  [place]) 11/25/2006   a. Multaq >> changes to Amiodarone 2/2 $$  //  Eliquis (CHADS2-VASc = 6)  . Urinary, incontinence, stress female    wears Pessary      Past Surgical History:  Procedure Laterality Date  . ABDOMINAL HYSTERECTOMY  1973  . CATARACT EXTRACTION W/ INTRAOCULAR LENS  IMPLANT, BILATERAL Bilateral 1999  . CHOLECYSTECTOMY N/A 01/03/2015   Procedure: LAPAROSCOPIC CHOLECYSTECTOMY WITH INTRAOPERATIVE CHOLANGIOGRAM;  Surgeon: Donnie Mesa, MD;  Location: [facility];  Service: General;  Laterality: N/A;  . CORONARY ANGIOPLASTY  1997   Dr Lia Foyer  . CORONARY ARTERY BYPASS GRAFT  1997   Dr Melvenia Needles  . LAPAROSCOPIC CHOLECYSTECTOMY  01/03/2015  . TONSILLECTOMY        Social History:     Social History  Substance Use Topics  . Smoking status: Never Smoker  . Smokeless tobacco: Never Used  . Alcohol use No     Lives - at home  Mobility - walks by self   Family History :     Family History  Problem Relation Age of Onset  . Heart attack Mother   . Heart failure Father   . Throat cancer Sister   . Heart disease Sister   . Coronary artery disease Sister   . Diabetes Sister   . Heart attack Sister   . Colon cancer  Neg Hx       Home Medications:   Prior to Admission medications   Medication Sig Start Date End Date Taking? Authorizing Provider  acetaminophen (TYLENOL) 500 MG tablet Take 500-1,000 mg by mouth every 6 (six) hours as needed for moderate pain.    Yes [provider]  amiodarone (PACERONE) 200 MG tablet TAKE ONE-HALF TABLET DAILY Patient taking differently: TAKE 100 mg TABLET DAILY 01/08/17  Yes Gildardo Cranker, DO  DULoxetine (CYMBALTA) 60 MG capsule Take 1 capsule (60 mg total) by mouth daily. For mood and chronic pain 01/29/17  Yes Carter, Monica, DO  ELIQUIS 5 MG TABS tablet Take 1 tablet (5 mg total) by mouth 2 (two) times daily. 07/26/16  Yes Josue Hector, MD  LORazepam (ATIVAN) 1 MG tablet TAKE ONE TABLET EVERY EIGHT HOURS AS NEEDED FOR ANXIETY 01/21/17  Yes Eulas Post, Monica, DO  losartan (COZAAR) 50 MG tablet TAKE ONE TABLET TWICE DAILY Patient taking differently: TAKE 50 mg  TABLET TWICE DAILY 09/11/16  Yes Josue Hector, MD  meclizine (ANTIVERT) 12.5 MG tablet TAKE ONE OR TWO TABLETS EVERY EIGHT HOURS AS NEEDED FOR DIZZINESS 01/21/17  Yes Gildardo Cranker, DO  Multiple Vitamin (MULTIVITAMIN WITH MINERALS) TABS tablet Take 1 tablet by mouth daily.   Yes [provider]  Multiple Vitamins-Minerals (PRESERVISION AREDS) TABS Take 1 tablet by mouth 2 (two) times daily.    Yes [provider]  niacin 500 MG CR capsule Take 500 mg by mouth 2 (two) times daily.    Yes [provider]  pantoprazole (PROTONIX) 40 MG tablet TAKE ONE TABLET TWICE DAILY Patient taking differently: TAKE 40 mg TABLET TWICE DAILY 09/17/16  Yes Gildardo Cranker, DO  sodium chloride (OCEAN) 0.65 % SOLN nasal spray Place 1 spray into both nostrils at bedtime.    Yes [provider]  Tetrahydrozoline HCl (VISINE OP) Place 2 drops into both eyes 2 (two) times daily as needed (for dry eyes).    Yes [provider]  traMADol (ULTRAM) 50 MG tablet Take one tablet by mouth every  eight hours as needed for moderate to severe pain 01/29/17  Yes Gildardo Cranker, DO  zolpidem (AMBIEN) 10 MG tablet Take 1 tablet (10 mg total) by mouth at bedtime as needed. for sleep 01/29/17  Yes Gildardo Cranker, DO     Allergies:     Allergies  Allergen Reactions  . Asa [Aspirin] Other (See Comments)    Bleeding ulcer  . Crestor [Rosuvastatin Calcium] Other (See Comments)    myalgia  . Hydrocodone Other (See Comments)    REACTION: migraines  . Ibuprofen Other (See Comments)    ulcers  . Oxycodone Hcl Other (See Comments)    REACTION: migraines  . Penicillins Swelling and Rash    Has patient had a PCN reaction causing immediate rash, facial/tongue/throat swelling, SOB or lightheadedness with hypotension: Yes Has patient had a PCN reaction causing severe rash involving mucus membranes or skin necrosis: No Has patient had a PCN reaction that required hospitalization No Has patient had a PCN reaction occurring within the last 10 years: No If all of the above answers are "NO", then may proceed with Cephalosporin use.   . Prednisone Other (See Comments)    ulcers  . Statins Other (See Comments)    Aches and pains  . Morphine And Related Other (See Comments)     Had hallucinations with morphine sulfate in the past    . Pristiq [Desvenlafaxine] Other (See Comments)    Drowsiness and hangover sensation     Physical Exam:   Vitals  Blood pressure (!) 122/51, pulse 71, temperature 98.2 F (36.8 C), temperature source Oral, resp. rate 17, height 5\' 7"  (1.702 m), weight 67.6 kg (149 lb), SpO2 97 %.   1. General  lying in bed in NAD,    2. Normal affect and insight, Not Suicidal or Homicidal, Awake Alert, Oriented X 3.  3. No F.N deficits, ALL C.Nerves Intact, Strength 5/5 all 4 extremities, Sensation intact all 4 extremities, Plantars down going.  4. Ears and Eyes appear Normal, Conjunctivae clear, PERRLA. Moist Oral Mucosa.  5. Supple Neck, No JVD, No cervical lymphadenopathy  appriciated, No Carotid Bruits.  6. Symmetrical Chest wall movement, Good air movement bilaterally, CTAB.  7. RRR, No Gallops, Rubs or Murmurs, No Parasternal Heave.  8. Positive Bowel Sounds, Abdomen Soft, No tenderness, No organomegaly appriciated,No rebound -guarding or rigidity.  9.  No Cyanosis, Normal Skin Turgor, No Skin Rash or Bruise.  10. Good muscle tone,  joints appear normal , no effusions, Normal ROM.  11. No Palpable Lymph Nodes in Neck or Axillae    Data Review:    CBC  Recent Labs Lab 02/21/17 1813 02/21/17 1817  WBC 14.3*  --   HGB 14.2 14.3  HCT 42.3 42.0  PLT 210  --   MCV 90.2  --   MCH 30.3  --   MCHC 33.6  --   RDW 13.3  --   LYMPHSABS 0.6*  --   MONOABS 0.9  --   EOSABS 0.0  --   BASOSABS 0.0  --    ------------------------------------------------------------------------------------------------------------------  Chemistries   Recent Labs Lab 02/21/17 1813 02/21/17 1817  NA 140 138  K 3.7 3.6  CL 98* 97*  CO2 28  --   GLUCOSE 123* 122*  BUN 16 16  CREATININE 0.53 0.50  CALCIUM 9.6  --   AST 33  --   ALT 18  --   ALKPHOS 93  --   BILITOT 1.1  --    ------------------------------------------------------------------------------------------------------------------ estimated creatinine clearance is 47.3 mL/min (by C-G formula based  on SCr of 0.5 mg/dL). ------------------------------------------------------------------------------------------------------------------ No results for input(s): TSH, T4TOTAL, T3FREE, THYROIDAB in the last 72 hours.  Invalid input(s): FREET3  Coagulation profile  Recent Labs Lab 02/21/17 1813  INR 1.13   ------------------------------------------------------------------------------------------------------------------- No results for input(s): DDIMER in the last 72 hours. -------------------------------------------------------------------------------------------------------------------  Cardiac  Enzymes No results for input(s): CKMB, TROPONINI, MYOGLOBIN in the last 168 hours.  Invalid input(s): CK ------------------------------------------------------------------------------------------------------------------ No results found for: BNP   ---------------------------------------------------------------------------------------------------------------  Urinalysis    Component Value Date/Time   COLORURINE AMBER (A) 10/04/2016 1825   APPEARANCEUR HAZY (A) 10/04/2016 1825   LABSPEC 1.014 10/04/2016 1825   PHURINE 5.0 10/04/2016 1825   GLUCOSEU NEGATIVE 10/04/2016 1825   HGBUR NEGATIVE 10/04/2016 1825   BILIRUBINUR NEGATIVE 10/04/2016 1825   BILIRUBINUR 2+ 11/09/2014 1429   KETONESUR 5 (A) 10/04/2016 1825   PROTEINUR 100 (A) 10/04/2016 1825   UROBILINOGEN negative 11/09/2014 1429   UROBILINOGEN 0.2 01/12/2013 1618   NITRITE NEGATIVE 10/04/2016 1825   LEUKOCYTESUR TRACE (A) 10/04/2016 1825    ----------------------------------------------------------------------------------------------------------------   Imaging Results:    Ct Cervical Spine Wo Contrast  Result Date: 02/21/2017 CLINICAL DATA:  Fall.  Right-sided weakness. Initial encounter. EXAM: CT CERVICAL SPINE WITHOUT CONTRAST TECHNIQUE: Multidetector CT imaging of the cervical spine was performed without intravenous contrast. Multiplanar CT image reconstructions were also generated. COMPARISON:  None. FINDINGS: Alignment: No traumatic malalignment. Skull base and vertebrae: Negative for fracture. Soft tissues and spinal canal: No prevertebral fluid or swelling. No visible canal hematoma. Disc levels: Diffuse disc degeneration and facet spurring. No evidence cord impingement. Upper chest: No acute finding IMPRESSION: No evidence of cervical spine injury. Electronically Signed   By: Monte Fantasia M.D.   On: 02/21/2017 18:43   Mr Brain Wo Contrast  Result Date: 02/22/2017 CLINICAL DATA:  Found down at home. Leg  weakness, speech difficulties. History of atrial fibrillation, hypertension, hyperlipidemia, carotid artery disease. EXAM: MRI HEAD WITHOUT CONTRAST MRA HEAD WITHOUT CONTRAST TECHNIQUE: Multiplanar, multiecho pulse sequences of the brain and surrounding structures were obtained without intravenous contrast. Angiographic images of the head were obtained using MRA technique without contrast. COMPARISON:  CT HEAD February 21, 2017 at 1822 hours and MRI/MRA head September 28, 2009 FINDINGS: MRI HEAD FINDINGS BRAIN: No reduced diffusion to suggest acute ischemia. LEFT posterior temporal chronic microhemorrhage. Confluent supratentorial and patchy pontine white matter FLAIR T2 hyperintensities worse than prior MRI. Faint T2 hyperintensities bilateral basal ganglia and bowel eye associated with chronic small vessel ischemic disease. Old RIGHT thalamus lacunar infarcts. VASCULAR: Normal major intracranial vascular flow voids present at skull base. SKULL AND UPPER CERVICAL SPINE: No abnormal sellar expansion. No suspicious calvarial bone marrow signal. Craniocervical junction maintained. SINUSES/ORBITS: The mastoid air-cells and included paranasal sinuses are well-aerated. The included ocular globes and orbital contents are non-suspicious. Status post bilateral ocular lens implants. OTHER: None. MRA HEAD FINDINGS ANTERIOR CIRCULATION: Normal flow related enhancement of the included cervical, petrous, cavernous and supraclinoid internal carotid arteries. Patent anterior communicating artery. Patent anterior and middle cerebral arteries, mild luminal irregularity compatible with atherosclerosis. Severe stenosis LEFT A2 segment, moderate stenosis RIGHT A2 segment. No large vessel occlusion,   aneurysm. POSTERIOR CIRCULATION: Codominant vertebral artery's. Similarly tortuous mid basilar artery. Scratch Basilar artery is patent, with normal flow related enhancement of the main branch vessels. Patent bilateral vertebral artery's. Fetal  origin on the LEFT, robust RIGHT posterior communicating artery with tiny P1 segment. No large vessel occlusion, flow limiting stenosis,  aneurysm. ANATOMIC VARIANTS: None. Source images  and MIP images were reviewed. IMPRESSION: MRI HEAD: 1. No acute intracranial process. 2. Progressed severe chronic small vessel ischemic disease. Old RIGHT thalamus lacunar infarcts. MRA HEAD: 1. No emergent large vessel occlusion. 2. Intracranial atherosclerosis, severe stenosis LEFT A2 segment. Electronically Signed   By: Elon Alas M.D.   On: 02/22/2017 00:40   Mr Virgel Paling OI Contrast  Result Date: 02/22/2017 CLINICAL DATA:  Found down at home. Leg weakness, speech difficulties. History of atrial fibrillation, hypertension, hyperlipidemia, carotid artery disease. EXAM: MRI HEAD WITHOUT CONTRAST MRA HEAD WITHOUT CONTRAST TECHNIQUE: Multiplanar, multiecho pulse sequences of the brain and surrounding structures were obtained without intravenous contrast. Angiographic images of the head were obtained using MRA technique without contrast. COMPARISON:  CT HEAD February 21, 2017 at 1822 hours and MRI/MRA head September 28, 2009 FINDINGS: MRI HEAD FINDINGS BRAIN: No reduced diffusion to suggest acute ischemia. LEFT posterior temporal chronic microhemorrhage. Confluent supratentorial and patchy pontine white matter FLAIR T2 hyperintensities worse than prior MRI. Faint T2 hyperintensities bilateral basal ganglia and bowel eye associated with chronic small vessel ischemic disease. Old RIGHT thalamus lacunar infarcts. VASCULAR: Normal major intracranial vascular flow voids present at skull base. SKULL AND UPPER CERVICAL SPINE: No abnormal sellar expansion. No suspicious calvarial bone marrow signal. Craniocervical junction maintained. SINUSES/ORBITS: The mastoid air-cells and included paranasal sinuses are well-aerated. The included ocular globes and orbital contents are non-suspicious. Status post bilateral ocular lens implants. OTHER:  None. MRA HEAD FINDINGS ANTERIOR CIRCULATION: Normal flow related enhancement of the included cervical, petrous, cavernous and supraclinoid internal carotid arteries. Patent anterior communicating artery. Patent anterior and middle cerebral arteries, mild luminal irregularity compatible with atherosclerosis. Severe stenosis LEFT A2 segment, moderate stenosis RIGHT A2 segment. No large vessel occlusion,   aneurysm. POSTERIOR CIRCULATION: Codominant vertebral artery's. Similarly tortuous mid basilar artery. Scratch Basilar artery is patent, with normal flow related enhancement of the main branch vessels. Patent bilateral vertebral artery's. Fetal origin on the LEFT, robust RIGHT posterior communicating artery with tiny P1 segment. No large vessel occlusion, flow limiting stenosis,  aneurysm. ANATOMIC VARIANTS: None. Source images and MIP images were reviewed. IMPRESSION: MRI HEAD: 1. No acute intracranial process. 2. Progressed severe chronic small vessel ischemic disease. Old RIGHT thalamus lacunar infarcts. MRA HEAD: 1. No emergent large vessel occlusion. 2. Intracranial atherosclerosis, severe stenosis LEFT A2 segment. Electronically Signed   By: Elon Alas M.D.   On: 02/22/2017 00:40   Ct Head Code Stroke Wo Contrast  Result Date: 02/21/2017 CLINICAL DATA:  Code stroke. Aphasia. Right-sided weakness. Fall on Eliquis. EXAM: CT HEAD WITHOUT CONTRAST TECHNIQUE: Contiguous axial images were obtained from the base of the skull through the vertex without intravenous contrast. COMPARISON:  01/15/2017 FINDINGS: Brain: No evidence of acute infarction, hemorrhage, hydrocephalus, extra-axial collection or mass lesion/mass effect. Advanced low-density throughout the cerebral white matter. Stable appearance of patchy low-density in the bilateral deep gray nuclei from small vessel ischemia and lacunar infarcts. Suspect chronic pontine small-vessel disease as well. Age normal brain volume. Vascular: Atherosclerotic  calcification.  No hyperdense vessel. Skull: No acute finding Sinuses/Orbits: Bilateral cataract resection. ASPECTS [facility] Stroke Program Early CT Score) Not scored given the extent of chronic subcortical injury. These results were called by telephone at the time of interpretation on 02/21/2017 at 6:40 pm to Dr. Milton Ferguson , who verbally acknowledged these results. IMPRESSION: 1. No hemorrhage or acute cortical infarct. 2. Advanced chronic small vessel ischemia in the cerebral white matter and deep gray nuclei. A superimposed infarct would  easily be obscured. Electronically Signed   By: Monte Fantasia M.D.   On: 02/21/2017 18:40      Assessment & Plan:    Active Problems:   TIA (transient ischemic attack)    TIA MRI/ MRA brain Carotid ultrasound Cardiac echo Check hga1c, lipi Permissive hypertension No STATIN due to intolerance Start zetia 10mg  po qday Continue eliquis  Pafib Continue amiodarone Cont eliquis  Hypertension Cont losartan  Anxiety Cont ativan prn  Gerd Continue protonix      DVT Prophylaxis Eliquis  AM Labs Ordered, also please review Full Orders  Family Communication: Admission, patients condition and plan of care including tests being ordered have been discussed with the patient  who indicate understanding and agree with the plan and Code Status.  Code Status FULL CODE  Likely DC to  home  Condition GUARDED    Consults called: neurology by ED  Admission status: observation  Time spent in minutes : 45   Jani Gravel M.D on 02/22/2017 at 5:01 AM  Between 7am to 7pm - Pager - [phone]    After 7pm go to [email or web address] - password [facility]  [facility] - Office  [phone]

## 2017-02-21 NOTE — ED Notes (Signed)
Neuro at Bedside.

## 2017-02-21 NOTE — Consult Note (Signed)
Requesting Physician: Dr.     Laurel Dimmer Complaint: aphasia,   History obtained from: Patient and Chart    HPI:                                                                                                                                       Marissa Bowen is an 83 y.o. female history of CVA in 2011 with residual right leg weakness, chronic back pain, CAD, Afib on Eliquis was found down by daughter this afternoon. LSN was last night 10 pm. Patient was unable to ambulate.  She was taken to [facility] and noted to have  weakness in both legs but worse in the right. Patient also has been a little difficulty getting words out. Stroke alert was called and patient brought to [facility] ER. The patient states she has chronic lower extremity weakness, worse on right leg apparently due to prior stroke. Her blood pressure was elevated to 409 systolic in the ER.     Date last known well: 10.25.18 Time last known well: 10 pm tPA Given: no, last  NIHSS: 1 Baseline MRS 2    Past Medical History:  Diagnosis Date  . Anemia   . Anxiety   . BACK PAIN, LUMBAR 09/27/2009  . CAD (coronary artery disease)    a. s/p remote CABG 1997, 3V.;  b. LexiScan Myoview (8/15): No ischemia, EF 72%, normal study;  c. Echo 12/13 - EF 60-65%, no significant valvular abnormalities, normal RV size and systolic function.   . CVA (cerebral infarction)    a. Thalamic infarct 09/2009. //  b. hx of TIA in 2008  . Gastric ulcer   . GERD 11/25/2006  . GI bleed    a. 09/2009: secondary to antral ulcer.  . Hiatal hernia   . History of diverticulitis Dx 02/2012  . History of MI (myocardial infarction)   . HYPERLIPIDEMIA   . HYPERTENSION   . IBS 12/07/2008  . MIGRAINE WITH AURA 08/13/2007  . Mild carotid artery disease ([place])    a. 0-39% bilat 05/2011 (f/u recommended 05/2013). //  b. Carotid US 3/17 - Bilat ICA 1-39% >> FU prn  . Muscle weakness (generalized) 09/27/2009  . OSTEOARTHRITIS 08/13/2007  . PAF (paroxysmal atrial fibrillation)  ([place]) 11/25/2006   a. Multaq >> changes to Amiodarone 2/2 $$  //  Eliquis (CHADS2-VASc = 6)  . Urinary, incontinence, stress female    wears Pessary    Past Surgical History:  Procedure Laterality Date  . ABDOMINAL HYSTERECTOMY  1973  . CATARACT EXTRACTION W/ INTRAOCULAR LENS  IMPLANT, BILATERAL Bilateral 1999  . CHOLECYSTECTOMY N/A 01/03/2015   Procedure: LAPAROSCOPIC CHOLECYSTECTOMY WITH INTRAOPERATIVE CHOLANGIOGRAM;  Surgeon: Donnie Mesa, MD;  Location: [place];  Service: General;  Laterality: N/A;  . CORONARY ANGIOPLASTY  1997   Dr Lia Foyer  . CORONARY ARTERY BYPASS GRAFT  1997   Dr Melvenia Needles  . LAPAROSCOPIC  CHOLECYSTECTOMY  01/03/2015  . TONSILLECTOMY      Family History  Problem Relation Age of Onset  . Heart attack Mother   . Heart failure Father   . Throat cancer Sister   . Heart disease Sister   . Coronary artery disease Sister   . Diabetes Sister   . Heart attack Sister   . Colon cancer Neg Hx    Social History:  reports that she has never smoked. She has never used smokeless tobacco. She reports that she does not drink alcohol or use drugs.  Allergies:  Allergies  Allergen Reactions  . Asa [Aspirin] Other (See Comments)    Bleeding ulcer  . Crestor [Rosuvastatin Calcium] Other (See Comments)    myalgia  . Hydrocodone Other (See Comments)    REACTION: migraines  . Ibuprofen Other (See Comments)    ulcers  . Oxycodone Hcl Other (See Comments)    REACTION: migraines  . Penicillins Swelling and Rash    Has patient had a PCN reaction causing immediate rash, facial/tongue/throat swelling, SOB or lightheadedness with hypotension: Yes Has patient had a PCN reaction causing severe rash involving mucus membranes or skin necrosis: No Has patient had a PCN reaction that required hospitalization No Has patient had a PCN reaction occurring within the last 10 years: No If all of the above answers are "NO", then may proceed with Cephalosporin use.   . Prednisone Other (See  Comments)    ulcers  . Statins Other (See Comments)    Aches and pains  . Morphine And Related Other (See Comments)     Had hallucinations with morphine sulfate in the past    . Pristiq [Desvenlafaxine] Other (See Comments)    Drowsiness and hangover sensation    Medications:                                                                                                                        I reviewed home medications  ROS:                                                                                                                                     14 systems reviewed and negative except above    Examination:  General: Appears well-developed and well-nourished.  Psych: Affect appropriate to situation Eyes: No scleral injection HENT: No OP obstrucion Head: Normocephalic.  Cardiovascular: Normal rate and regular rhythm.  Respiratory: Effort normal and breath sounds normal to anterior ascultation GI: Soft.  No distension. There is no tenderness.  Skin: WDI   Neurological Examination Mental Status: Alert, oriented, thought content appropriate.  Speech fluent without evidence of aphasia.  Able to follow 3 step commands without difficulty. Cranial Nerves: II: Discs flat bilaterally; Visual fields grossly normal,  III,IV, VI: ptosis not present, extra-ocular motions intact bilaterally, pupils equal, round, reactive to light and accommodation V,VII: smile symmetric, facial light touch sensation normal bilaterally VIII: hearing normal bilaterally IX,X: uvula rises symmetrically XI: bilateral shoulder shrug XII: midline tongue extension Motor: Right : Upper extremity   5/5    Left:     Upper extremity   5/5  Lower extremity   4/5               Lower extremity   4+/5 Tone and bulk:normal tone throughout; no atrophy noted Sensory: Pinprick and light touch intact throughout,  bilaterally Deep Tendon Reflexes: 2+ and symmetric throughout Plantars: Right: downgoing   Left: downgoing Cerebellar: normal finger-to-nose, normal rapid alternating movements and normal heel-to-shin test Gait: normal gait and station     Lab Results: Basic Metabolic Panel:  Recent Labs Lab 02/21/17 1813 02/21/17 1817  NA 140 138  K 3.7 3.6  CL 98* 97*  CO2 28  --   GLUCOSE 123* 122*  BUN 16 16  CREATININE 0.53 0.50  CALCIUM 9.6  --     CBC:  Recent Labs Lab 02/21/17 1813 02/21/17 1817  WBC 14.3*  --   NEUTROABS 12.8*  --   HGB 14.2 14.3  HCT 42.3 42.0  MCV 90.2  --   PLT 210  --     Coagulation Studies:  Recent Labs  02/21/17 1813  LABPROT 14.5  INR 1.13    Imaging: Ct Cervical Spine Wo Contrast  Result Date: 02/21/2017 CLINICAL DATA:  Fall.  Right-sided weakness. Initial encounter. EXAM: CT CERVICAL SPINE WITHOUT CONTRAST TECHNIQUE: Multidetector CT imaging of the cervical spine was performed without intravenous contrast. Multiplanar CT image reconstructions were also generated. COMPARISON:  None. FINDINGS: Alignment: No traumatic malalignment. Skull base and vertebrae: Negative for fracture. Soft tissues and spinal canal: No prevertebral fluid or swelling. No visible canal hematoma. Disc levels: Diffuse disc degeneration and facet spurring. No evidence cord impingement. Upper chest: No acute finding IMPRESSION: No evidence of cervical spine injury. Electronically Signed   By: Monte Fantasia M.D.   On: 02/21/2017 18:43   Ct Head Code Stroke Wo Contrast  Result Date: 02/21/2017 CLINICAL DATA:  Code stroke. Aphasia. Right-sided weakness. Fall on Eliquis. EXAM: CT HEAD WITHOUT CONTRAST TECHNIQUE: Contiguous axial images were obtained from the base of the skull through the vertex without intravenous contrast. COMPARISON:  01/15/2017 FINDINGS: Brain: No evidence of acute infarction, hemorrhage, hydrocephalus, extra-axial collection or mass lesion/mass effect.  Advanced low-density throughout the cerebral white matter. Stable appearance of patchy low-density in the bilateral deep gray nuclei from small vessel ischemia and lacunar infarcts. Suspect chronic pontine small-vessel disease as well. Age normal brain volume. Vascular: Atherosclerotic calcification.  No hyperdense vessel. Skull: No acute finding Sinuses/Orbits: Bilateral cataract resection. ASPECTS [facility] Stroke Program Early CT Score) Not scored given the extent of chronic subcortical injury. These results were called by telephone at the time of interpretation on 02/21/2017 at 6:40  pm to Dr. Broadus John ZAMMIT , who verbally acknowledged these results. IMPRESSION: 1. No hemorrhage or acute cortical infarct. 2. Advanced chronic small vessel ischemia in the cerebral white matter and deep gray nuclei. A superimposed infarct would easily be obscured. Electronically Signed   By: Monte Fantasia M.D.   On: 02/21/2017 18:40     ASSESSMENT AND PLAN  71 y F with history of CVA in 2011 with residual right leg weakness, chronic back pain, CAD, Afib on Eliquis presents to [facility] after being found on the floor by daughter. Noted to have difficluty with speech in ER and transferred as stroke alert. BP elevated to 197/87 at [facility]. Patient does not have any neglect, profound hemi body weakness or aphasia to suggest LVO.  Antigravity on both lower extremities. NIHSS 1 for LE drift. Not candidate for TPA as outside window.  Encephalopathy Possible Stroke/TIA  vs HTN emergency vs metabolic encephalopathy     Recommend # MRI of the brain without contrast #MRA Head and neck  #Transthoracic Echo  # Continue Elquis  #Start or continue Atorvastatin 80 mg/other high intensity statin # BP goal: permissive HTN upto 062 systolic, PRNs above 694 SBP # HBAIC and Lipid profile # Telemetry monitoring # Frequent neuro checks # NPO until passes stroke swallow screen  Please page stroke NP  Or  PA  Or MD from 8am -4 pm  as  this patient from this time will be  followed by the stroke.   You can look them up on [email or web address]  Password [facility]   Sushanth Aroor [facility] Pager Number [phone]

## 2017-02-21 NOTE — ED Triage Notes (Addendum)
Patient brought in by EMS from home. Patient lives by herself. Per report, patient was last seen by her daughter at 2000 last night, then this afternoon at 1500 the patient was found on the floor in her house. Patient unsure how long she was on the floor. Patient unsure of LOC.  Approx 1 hour after patient found by daughter, patient started experiencing "hallucinations" of bugs on her legs, which lasted for approx 10 min. Patient does take Eliquis daily. Small bruise noted above patient's right eye. Patient AxOx4 in triage, but is experiencing intermittent aphasia.

## 2017-02-21 NOTE — ED Notes (Signed)
ED Provider at bedside. 

## 2017-02-22 ENCOUNTER — Observation Stay (HOSPITAL_COMMUNITY): Payer: Medicare Other

## 2017-02-22 DIAGNOSIS — Z9842 Cataract extraction status, left eye: Secondary | ICD-10-CM | POA: Diagnosis not present

## 2017-02-22 DIAGNOSIS — Z8249 Family history of ischemic heart disease and other diseases of the circulatory system: Secondary | ICD-10-CM | POA: Diagnosis not present

## 2017-02-22 DIAGNOSIS — Z808 Family history of malignant neoplasm of other organs or systems: Secondary | ICD-10-CM | POA: Diagnosis not present

## 2017-02-22 DIAGNOSIS — R488 Other symbolic dysfunctions: Secondary | ICD-10-CM | POA: Diagnosis not present

## 2017-02-22 DIAGNOSIS — G459 Transient cerebral ischemic attack, unspecified: Secondary | ICD-10-CM | POA: Diagnosis not present

## 2017-02-22 DIAGNOSIS — I480 Paroxysmal atrial fibrillation: Secondary | ICD-10-CM | POA: Diagnosis present

## 2017-02-22 DIAGNOSIS — Z961 Presence of intraocular lens: Secondary | ICD-10-CM | POA: Diagnosis present

## 2017-02-22 DIAGNOSIS — Z951 Presence of aortocoronary bypass graft: Secondary | ICD-10-CM | POA: Diagnosis not present

## 2017-02-22 DIAGNOSIS — M6281 Muscle weakness (generalized): Secondary | ICD-10-CM | POA: Diagnosis not present

## 2017-02-22 DIAGNOSIS — R531 Weakness: Secondary | ICD-10-CM | POA: Diagnosis not present

## 2017-02-22 DIAGNOSIS — G43909 Migraine, unspecified, not intractable, without status migrainosus: Secondary | ICD-10-CM | POA: Diagnosis present

## 2017-02-22 DIAGNOSIS — E785 Hyperlipidemia, unspecified: Secondary | ICD-10-CM | POA: Diagnosis present

## 2017-02-22 DIAGNOSIS — Z9841 Cataract extraction status, right eye: Secondary | ICD-10-CM | POA: Diagnosis not present

## 2017-02-22 DIAGNOSIS — Z7901 Long term (current) use of anticoagulants: Secondary | ICD-10-CM | POA: Diagnosis not present

## 2017-02-22 DIAGNOSIS — I252 Old myocardial infarction: Secondary | ICD-10-CM | POA: Diagnosis not present

## 2017-02-22 DIAGNOSIS — I69398 Other sequelae of cerebral infarction: Secondary | ICD-10-CM | POA: Diagnosis not present

## 2017-02-22 DIAGNOSIS — Z833 Family history of diabetes mellitus: Secondary | ICD-10-CM | POA: Diagnosis not present

## 2017-02-22 DIAGNOSIS — S0990XA Unspecified injury of head, initial encounter: Secondary | ICD-10-CM | POA: Diagnosis not present

## 2017-02-22 DIAGNOSIS — G458 Other transient cerebral ischemic attacks and related syndromes: Secondary | ICD-10-CM | POA: Diagnosis not present

## 2017-02-22 DIAGNOSIS — G9340 Encephalopathy, unspecified: Secondary | ICD-10-CM | POA: Diagnosis not present

## 2017-02-22 DIAGNOSIS — M1990 Unspecified osteoarthritis, unspecified site: Secondary | ICD-10-CM | POA: Diagnosis present

## 2017-02-22 DIAGNOSIS — F0391 Unspecified dementia with behavioral disturbance: Secondary | ICD-10-CM | POA: Diagnosis present

## 2017-02-22 DIAGNOSIS — Z9049 Acquired absence of other specified parts of digestive tract: Secondary | ICD-10-CM | POA: Diagnosis not present

## 2017-02-22 DIAGNOSIS — I2510 Atherosclerotic heart disease of native coronary artery without angina pectoris: Secondary | ICD-10-CM | POA: Diagnosis present

## 2017-02-22 DIAGNOSIS — Z9861 Coronary angioplasty status: Secondary | ICD-10-CM | POA: Diagnosis not present

## 2017-02-22 DIAGNOSIS — F418 Other specified anxiety disorders: Secondary | ICD-10-CM | POA: Diagnosis present

## 2017-02-22 DIAGNOSIS — I10 Essential (primary) hypertension: Secondary | ICD-10-CM | POA: Diagnosis present

## 2017-02-22 DIAGNOSIS — I69351 Hemiplegia and hemiparesis following cerebral infarction affecting right dominant side: Secondary | ICD-10-CM | POA: Diagnosis not present

## 2017-02-22 DIAGNOSIS — I6523 Occlusion and stenosis of bilateral carotid arteries: Secondary | ICD-10-CM | POA: Diagnosis not present

## 2017-02-22 DIAGNOSIS — K589 Irritable bowel syndrome without diarrhea: Secondary | ICD-10-CM | POA: Diagnosis present

## 2017-02-22 DIAGNOSIS — H04123 Dry eye syndrome of bilateral lacrimal glands: Secondary | ICD-10-CM | POA: Diagnosis not present

## 2017-02-22 DIAGNOSIS — K219 Gastro-esophageal reflux disease without esophagitis: Secondary | ICD-10-CM | POA: Diagnosis present

## 2017-02-22 DIAGNOSIS — R55 Syncope and collapse: Secondary | ICD-10-CM | POA: Diagnosis not present

## 2017-02-22 DIAGNOSIS — R4701 Aphasia: Secondary | ICD-10-CM | POA: Diagnosis present

## 2017-02-22 LAB — COMPREHENSIVE METABOLIC PANEL
ALT: 17 U/L (ref 14–54)
ANION GAP: 11 (ref 5–15)
AST: 28 U/L (ref 15–41)
Albumin: 3.5 g/dL (ref 3.5–5.0)
Alkaline Phosphatase: 82 U/L (ref 38–126)
BUN: 13 mg/dL (ref 6–20)
CHLORIDE: 100 mmol/L — AB (ref 101–111)
CO2: 26 mmol/L (ref 22–32)
Calcium: 9.1 mg/dL (ref 8.9–10.3)
Creatinine, Ser: 0.66 mg/dL (ref 0.44–1.00)
GFR calc Af Amer: >60 mL/min (ref >60)
GFR calc non Af Amer: >60 mL/min (ref >60)
GLUCOSE: 87 mg/dL (ref 65–99)
POTASSIUM: 3.3 mmol/L — AB (ref 3.5–5.1)
SODIUM: 137 mmol/L (ref 135–145)
Total Bilirubin: 1.3 mg/dL — ABNORMAL HIGH (ref 0.3–1.2)
Total Protein: 6.4 g/dL — ABNORMAL LOW (ref 6.5–8.1)

## 2017-02-22 LAB — LIPID PANEL
CHOLESTEROL: 191 mg/dL (ref 0–200)
HDL: 65 mg/dL (ref >40)
LDL Cholesterol: 110 mg/dL — ABNORMAL HIGH (ref 0–99)
Total CHOL/HDL Ratio: 2.9 RATIO
Triglycerides: 82 mg/dL (ref <150)
VLDL: 16 mg/dL (ref 0–40)

## 2017-02-22 LAB — CBC
HEMATOCRIT: 39.1 % (ref 36.0–46.0)
HEMOGLOBIN: 12.7 g/dL (ref 12.0–15.0)
MCH: 29.6 pg (ref 26.0–34.0)
MCHC: 32.5 g/dL (ref 30.0–36.0)
MCV: 91.1 fL (ref 78.0–100.0)
Platelets: 226 10*3/uL (ref 150–400)
RBC: 4.29 MIL/uL (ref 3.87–5.11)
RDW: 13.5 % (ref 11.5–15.5)
WBC: 10.5 10*3/uL (ref 4.0–10.5)

## 2017-02-22 LAB — CK: CK TOTAL: 278 U/L — AB (ref 38–234)

## 2017-02-22 LAB — HEMOGLOBIN A1C
HEMOGLOBIN A1C: 5.2 % (ref 4.8–5.6)
MEAN PLASMA GLUCOSE: 102.54 mg/dL

## 2017-02-22 LAB — ECHOCARDIOGRAM COMPLETE
HEIGHTINCHES: 67 in
WEIGHTICAEL: 2384 [oz_av]

## 2017-02-22 LAB — ETHANOL

## 2017-02-22 MED ORDER — POTASSIUM CHLORIDE CRYS ER 20 MEQ PO TBCR
40.0000 meq | EXTENDED_RELEASE_TABLET | Freq: Once | ORAL | Status: AC
  Administered 2017-02-22: 40 meq via ORAL
  Filled 2017-02-22: qty 2

## 2017-02-22 MED ORDER — EZETIMIBE 10 MG PO TABS
10.0000 mg | ORAL_TABLET | Freq: Every day | ORAL | Status: DC
  Administered 2017-02-22 – 2017-02-25 (×4): 10 mg via ORAL
  Filled 2017-02-22 (×4): qty 1

## 2017-02-22 NOTE — Progress Notes (Signed)
STROKE TEAM PROGRESS NOTE   HISTORY OF PRESENT ILLNESS (per record) Marissa Bowen is an 83 y.o. female history of CVA in 2011 with residual right leg weakness, chronic back pain, CAD, Afib on Eliquis was found down by daughter this afternoon. LSN was last night 10 pm. Patient was unable to ambulate. She was taken to [facility] and noted to have weakness in both legs but worse in the right. Patient also has been a little difficulty getting words out. Stroke alert was called and patient brought to [facility] ER. The patient states she has chronic lower extremity weakness, worse on right leg apparently due to prior stroke. Her blood pressure was elevated to 562 systolic in the ER.   Date last known well: 10.25.18 Time last known well: 10 pm tPA Given: no, last  NIHSS: 1 Baseline MRS 2    SUBJECTIVE (INTERVAL HISTORY) Her family is at bedside. Discussed normal MRI, unclear etiology of symptoms, she takes her eliquis consistently. Patient lives alone, possible dementia, discussed with daughter she may need at this time to address this issue as patient may not be compliant with meds or safe at home alone.   OBJECTIVE Temp:  [97.6 F (36.4 C)-98.9 F (37.2 C)] 98.9 F (37.2 C) (10/27 0819) Pulse Rate:  [66-77] 77 (10/27 0819) Cardiac Rhythm: Normal sinus rhythm (10/27 0700) Resp:  [12-18] 18 (10/27 0819) BP: (122-197)/(51-146) 146/64 (10/27 0824) SpO2:  [96 %-100 %] 96 % (10/27 0819) Weight:  [149 lb (67.6 kg)] 149 lb (67.6 kg) (10/26 2206)  CBC:   Recent Labs Lab 02/21/17 1813 02/21/17 1817 02/22/17 0742  WBC 14.3*  --  10.5  NEUTROABS 12.8*  --   --   HGB 14.2 14.3 12.7  HCT 42.3 42.0 39.1  MCV 90.2  --  91.1  PLT 210  --  130    Basic Metabolic Panel:   Recent Labs Lab 02/21/17 1813 02/21/17 1817 02/22/17 0742  NA 140 138 137  K 3.7 3.6 3.3*  CL 98* 97* 100*  CO2 28  --  26  GLUCOSE 123* 122* 87  BUN 16 16 13   CREATININE 0.53 0.50 0.66  CALCIUM 9.6  --  9.1     Lipid Panel:     Component Value Date/Time   CHOL 191 02/22/2017 0742   CHOL 209 (H) 07/05/2015 0918   TRIG 82 02/22/2017 0742   HDL 65 02/22/2017 0742   HDL 61 07/05/2015 0918   CHOLHDL 2.9 02/22/2017 0742   VLDL 16 02/22/2017 0742   LDLCALC 110 (H) 02/22/2017 0742   LDLCALC 124 (H) 07/05/2015 0918   HgbA1c:  Lab Results  Component Value Date   HGBA1C 5.2 02/22/2017   Urine Drug Screen: No results found for: LABOPIA, COCAINSCRNUR, LABBENZ, AMPHETMU, THCU, LABBARB  Alcohol Level No results found for: ETH  IMAGING  Ct Cervical Spine Wo Contrast 02/21/2017 IMPRESSION:  No evidence of cervical spine injury.    Mr Jodene Nam Head Wo Contrast 02/22/2017 IMPRESSION:   MRI HEAD:  1. No acute intracranial process.  2. Progressed severe chronic small vessel ischemic disease.  3. Old RIGHT thalamus lacunar infarcts.   MRA HEAD:  1. No emergent large vessel occlusion.  2. Intracranial atherosclerosis, severe stenosis LEFT A2 segment.    Ct Head Code Stroke Wo Contrast 02/21/2017 IMPRESSION:  1. No hemorrhage or acute cortical infarct.  2. Advanced chronic small vessel ischemia in the cerebral white matter and deep gray nuclei. A superimposed infarct would easily  be obscured.   Transthoracic Echocardiogram - pending   Bilateral Carotid Dopplers - pending     PHYSICAL EXAM  Vitals:   02/22/17 0334 02/22/17 0652 02/22/17 0819 02/22/17 0824  BP: (!) 122/51 138/60 (!) 168/70 (!) 146/64  Pulse: 71 74 77   Resp: 17 18 18    Temp:  98.5 F (36.9 C) 98.9 F (37.2 C)   TempSrc:  Oral Oral   SpO2: 97% 97% 96%   Weight:      Height:        Cranial Nerves: II: Discs flat bilaterally; Visual fields grossly normal,  III,IV, VI: ptosis not present, extra-ocular motions intact bilaterally, pupils equal, round, reactive to light and accommodation V,VII: smile symmetric, facial light touch sensation normal bilaterally VIII: hearing normal bilaterally IX,X: uvula rises  symmetrically XI: bilateral shoulder shrug XII: midline tongue extension Motor: Right :  Upper extremity   5/5                                      Left:     Upper extremity   5/5             Lower extremity   4/5                                      Lower extremity   4+/5 Tone and bulk:normal tone throughout; no atrophy noted Sensory: Pinprick and light touch intact throughout, bilaterally Deep Tendon Reflexes: 2+ and symmetric throughout Plantars: Right: downgoing                                Left: downgoing Cerebellar: normal finger-to-nose, normal rapid alternating movements and normal heel-to-shin test Gait: normal gait and station      ASSESSMENT/PLAN Ms. Marissa Bowen is a 83 y.o. female with history of paroxysmal atrial fibrillation on Eliquis, migraine headaches, hypertension, hyperlipidemia, coronary artery disease with previous MI, history of a GI bleed, previous stroke, and anxiety presenting after she was found down by her daughter with bilateral lower extremity weakness right greater than left. (history of residual right sided weakness from previous stroke) She did not receive IV t-PA due to minimal deficits.  Possible TIA:   Resultant  Resolution of deficits  CT head No hemorrhage or acute cortical infarct.   MRI head - No acute intracranial process. Severe chronic small vessel disease. Old Rt thalamus lacunar infarcts.   MRA head - severe stenosis LEFT A2 segment.   Carotid Doppler - pending  2D Echo - pending  LDL - 110  HgbA1c - 5.2  VTE prophylaxis - Eliquis / SCDs Diet Heart Room service appropriate? Yes; Fluid consistency: Thin  Eliquis (apixaban) daily prior to admission, now on Eliquis (apixaban) daily  Patient counseled to be compliant with her antithrombotic medications  Ongoing aggressive stroke risk factor management  Therapy recommendations: pending  Disposition: Pending  Hypertension  Stable  Permissive hypertension (OK if <  220/120) but gradually normalize in 5-7 days  Long-term BP goal normotensive  Hyperlipidemia  Home meds:  Niacin  resumed in hospital  LDL 110, goal < 70  Zetia 10 mg daily added  History of statin intoleranc    Other Stroke Risk Factors  Advanced age  Hx stroke/TIA  Coronary artery disease  Migraines   Other Active Problems  Aspirin intolerance (GI bleed)  Statin intolerance (myalgias)  Hypokalemia - 3.3  Severe stenosis LEFT A2 segment.   Hospital day # 0  Personally examined patient and images, and have participated in and made any corrections needed to history, physical, neuro exam,assessment and plan as stated above.  I have personally obtained the history, evaluated lab date, reviewed imaging studies and agree with radiology interpretations.    Sarina Ill, MD Stroke Neurology  Stroke will sign off at this time, patient to be discharged on Eliquis, unclear etiology of symptoms, she takes her eliquis consistently. Patient lives alone, possible dementia, discussed with daughter she may need at this time to address this issue as patient may not be compliant with meds or safe at home alone.   To contact Stroke Continuity provider, please refer to [email or web address] After hours, contact General Neurology

## 2017-02-22 NOTE — Evaluation (Signed)
Speech Language Pathology Evaluation Patient Details Name: Marissa Bowen MRN: [id number] DOB: 1927/03/18 Today's Date: 02/22/2017 Time: 6644-0347 SLP Time Calculation (min) (ACUTE ONLY): 15 min  Problem List:  Patient Active Problem List   Diagnosis Date Noted  . TIA (transient ischemic attack) 02/21/2017  . GAD (generalized anxiety disorder) 11/19/2016  . Diverticulitis 10/05/2016  . Chest pain 10/04/2016  . Elevated LFTs 10/04/2016  . SIRS (systemic inflammatory response syndrome) ([place]) 10/04/2016  . Macular degeneration of both eyes 09/22/2015  . Carotid stenosis 08/10/2015  . On amiodarone therapy 08/10/2015  . Chronic cholecystitis with calculus 01/03/2015  . Allergic rhinitis 08/05/2014  . Essential hypertension 05/03/2014  . Osteoarthritis 09/29/2013  . Routine general medical examination at a health care facility 09/29/2013  . Nausea alone 08/03/2013  . Depression with anxiety 08/03/2013  . Atrial fibrillation ([place]) 09/02/2012  . Anemia due to chronic blood loss 08/18/2012  . Intractable nausea and vomiting 06/08/2012  . Diarrhea 06/08/2012  . Leukocytosis 06/08/2012  . Orthostatic hypotension 06/08/2012  . PALPITATIONS, HX OF 01/04/2010  . ACUTE POSTHEMORRHAGIC ANEMIA 10/23/2009  . BACK PAIN, LUMBAR 09/27/2009  . MUSCLE WEAKNESS (GENERALIZED) 09/27/2009  . IBS 12/07/2008  . Weakness 07/21/2008  . MIGRAINE WITH AURA 08/13/2007  . OSTEOARTHRITIS 08/13/2007  . TRANSIENT ISCHEMIC ATTACK 04/13/2007  . Hyperlipidemia LDL goal <100 11/25/2006  . CAD (coronary artery disease) 11/25/2006  . GERD 11/25/2006  . DIVERTICULITIS, HX OF 11/25/2006   Past Medical History:  Past Medical History:  Diagnosis Date  . Anemia   . Anxiety   . BACK PAIN, LUMBAR 09/27/2009  . CAD (coronary artery disease)    a. s/p remote CABG 1997, 3V.;  b. LexiScan Myoview (8/15): No ischemia, EF 72%, normal study;  c. Echo 12/13 - EF 60-65%, no significant valvular abnormalities, normal RV size  and systolic function.   . CVA (cerebral infarction)    a. Thalamic infarct 09/2009. //  b. hx of TIA in 2008  . Gastric ulcer   . GERD 11/25/2006  . GI bleed    a. 09/2009: secondary to antral ulcer.  . Hiatal hernia   . History of diverticulitis Dx 02/2012  . History of MI (myocardial infarction)   . HYPERLIPIDEMIA   . HYPERTENSION   . IBS 12/07/2008  . MIGRAINE WITH AURA 08/13/2007  . Mild carotid artery disease ([place])    a. 0-39% bilat 05/2011 (f/u recommended 05/2013). //  b. Carotid US 3/17 - Bilat ICA 1-39% >> FU prn  . Muscle weakness (generalized) 09/27/2009  . OSTEOARTHRITIS 08/13/2007  . PAF (paroxysmal atrial fibrillation) ([place]) 11/25/2006   a. Multaq >> changes to Amiodarone 2/2 $$  //  Eliquis (CHADS2-VASc = 6)  . Urinary, incontinence, stress female    wears Pessary   Past Surgical History:  Past Surgical History:  Procedure Laterality Date  . ABDOMINAL HYSTERECTOMY  1973  . CATARACT EXTRACTION W/ INTRAOCULAR LENS  IMPLANT, BILATERAL Bilateral 1999  . CHOLECYSTECTOMY N/A 01/03/2015   Procedure: LAPAROSCOPIC CHOLECYSTECTOMY WITH INTRAOPERATIVE CHOLANGIOGRAM;  Surgeon: Donnie Mesa, MD;  Location: [facility];  Service: General;  Laterality: N/A;  . CORONARY ANGIOPLASTY  1997   Dr Lia Foyer  . CORONARY ARTERY BYPASS GRAFT  1997   Dr Melvenia Needles  . LAPAROSCOPIC CHOLECYSTECTOMY  01/03/2015  . TONSILLECTOMY     HPI:  75 y F with history of CVA in 2011 with residual right leg weakness, chronic back pain, CAD, Afib on Eliquis presented to [facility] after being found on the  floor by daughter. Noted to have difficulty with speech in ER and transferred as stroke alert. BP elevated to 197/87 at [facility]. Per neurology patient does not have any neglect, profound hemi body weakness or aphasia to suggest LVO. Not candidate for TPA as outside window. MRI with no acute findings, old right thalamus lacunar infarcts.   Assessment / Plan / Recommendation Clinical Impression  Patient presents with  confusion, reduced sustained attention which are impacting her higher level cognitive functioning, safety awareness as well. Pt is alert and oriented to self, location, situation, disoriented to date. Administered Mini Mental State Examination; pt scored 21/30, indicating mild cognitive impairment. No family present to determine pt's baseline function. During testing, pt required verbal redirections, repetition of instructions due to frequent distractions. Pt reporting hallucinations, and staring at, speaking to people and animals not present. Despite her confusion/distraction, pt's immediate and delayed recall 3/3, and she follows 2 and 3 step commands. Recommend 24 hr care/supervision for safety, possible SNF depending on PT/OT recommendations. No further acute needs identified; SLP will s/o.     SLP Assessment  SLP Recommendation/Assessment: All further Speech Lanaguage Pathology  needs can be addressed in the next venue of care SLP Visit Diagnosis: Cognitive communication deficit (R41.841)    Follow Up Recommendations  Skilled Nursing facility;24 hour supervision/assistance    Frequency and Duration           SLP Evaluation Cognition  Overall Cognitive Status: Impaired/Different from baseline Arousal/Alertness: Awake/alert Orientation Level: Oriented to person;Oriented to place;Oriented to situation;Disoriented to time Attention: Focused;Sustained Focused Attention: Appears intact Sustained Attention: Impaired Sustained Attention Impairment: Verbal basic;Functional basic Behaviors: Other (comment) (hallucinations, speaking to people/animals not present) Safety/Judgment: Impaired       Comprehension  Auditory Comprehension Overall Auditory Comprehension: Appears within functional limits for tasks assessed Visual Recognition/Discrimination Discrimination: Within Function Limits Reading Comprehension Reading Status: Within funtional limits    Expression Expression Primary Mode of  Expression: Verbal Verbal Expression Overall Verbal Expression: Appears within functional limits for tasks assessed Initiation: No impairment Automatic Speech: Name;Social Response Level of Generative/Spontaneous Verbalization: Conversation Repetition: No impairment Naming: No impairment Pragmatics: Impairment Impairments: Eye contact Interfering Components: Attention Written Expression Dominant Hand: Right Written Expression: Within Functional Limits   Oral / Motor  Oral Motor/Sensory Function Overall Oral Motor/Sensory Function: Within functional limits Motor Speech Overall Motor Speech: Appears within functional limits for tasks assessed   GO          Functional Assessment Tool Used: skilled clinical judgment Functional Limitations: Attention Attention Current Status (Z6109): At least 40 percent but less than 60 percent impaired, limited or restricted Attention Goal Status (U0454): At least 40 percent but less than 60 percent impaired, limited or restricted Attention Discharge Status [id number]): At least 40 percent but less than 60 percent impaired, limited or restricted         Aliene Altes 02/22/2017, 9:10 AM  Deneise Lever, [place], [place] Speech-Language Pathologist [phone]

## 2017-02-22 NOTE — Progress Notes (Signed)
NURSING PROGRESS NOTE  Marissa Furrow IzzellMRN: [id number] Admission Data: 02/22/17 at 2115 Attending Provider: Jani Gravel, MD PCP: Gildardo Cranker, DO Code status: Full  Allergies:  Allergies  Allergen Reactions  . Asa [Aspirin] Other (See Comments)    Bleeding ulcer  . Crestor [Rosuvastatin Calcium] Other (See Comments)    myalgia  . Hydrocodone Other (See Comments)    REACTION: migraines  . Ibuprofen Other (See Comments)    ulcers  . Oxycodone Hcl Other (See Comments)    REACTION: migraines  . Penicillins Swelling and Rash    Has patient had a PCN reaction causing immediate rash, facial/tongue/throat swelling, SOB or lightheadedness with hypotension: Yes Has patient had a PCN reaction causing severe rash involving mucus membranes or skin necrosis: No Has patient had a PCN reaction that required hospitalization No Has patient had a PCN reaction occurring within the last 10 years: No If all of the above answers are "NO", then may proceed with Cephalosporin use.   . Prednisone Other (See Comments)    ulcers  . Statins Other (See Comments)    Aches and pains  . Morphine And Related Other (See Comments)     Had hallucinations with morphine sulfate in the past    . Pristiq [Desvenlafaxine] Other (See Comments)    Drowsiness and hangover sensation   Past Medical History:  Past Medical History:  Diagnosis Date  . Anemia   . Anxiety   . BACK PAIN, LUMBAR 09/27/2009  . CAD (coronary artery disease)    a. s/p remote CABG 1997, 3V.;  b. LexiScan Myoview (8/15): No ischemia, EF 72%, normal study;  c. Echo 12/13 - EF 60-65%, no significant valvular abnormalities, normal RV size and systolic function.   . CVA (cerebral infarction)    a. Thalamic infarct 09/2009. //  b. hx of TIA in 2008  . Gastric ulcer   . GERD 11/25/2006  . GI bleed    a. 09/2009: secondary to antral ulcer.  . Hiatal hernia   . History of diverticulitis Dx 02/2012  . History of MI (myocardial infarction)   .  HYPERLIPIDEMIA   . HYPERTENSION   . IBS 12/07/2008  . MIGRAINE WITH AURA 08/13/2007  . Mild carotid artery disease ([place])    a. 0-39% bilat 05/2011 (f/u recommended 05/2013). //  b. Carotid US 3/17 - Bilat ICA 1-39% >> FU prn  . Muscle weakness (generalized) 09/27/2009  . OSTEOARTHRITIS 08/13/2007  . PAF (paroxysmal atrial fibrillation) ([place]) 11/25/2006   a. Multaq >> changes to Amiodarone 2/2 $$  //  Eliquis (CHADS2-VASc = 6)  . Urinary, incontinence, stress female    wears Pessary  Past Surgical History:   Past Surgical History:  Procedure Laterality Date  . ABDOMINAL HYSTERECTOMY  1973  . CATARACT EXTRACTION W/ INTRAOCULAR LENS  IMPLANT, BILATERAL Bilateral 1999  . CHOLECYSTECTOMY N/A 01/03/2015   Procedure: LAPAROSCOPIC CHOLECYSTECTOMY WITH INTRAOPERATIVE CHOLANGIOGRAM;  Surgeon: Donnie Mesa, MD;  Location: [place];  Service: General;  Laterality: N/A;  . CORONARY ANGIOPLASTY  1997   Dr Lia Foyer  . CORONARY ARTERY BYPASS GRAFT  1997   Dr Melvenia Needles  . LAPAROSCOPIC CHOLECYSTECTOMY  01/03/2015  . TONSILLECTOMY    Marissa Bowen is a 83 y.o. female patient, arrived to floor in room [phone] via stretcher, transferred from ED. Patient alert and oriented X 4. No acute distress noted. Denies pain.   Vital signs: Oral temperature 98.2 F (36.8 C), Blood pressure 157/70, Pulse 73, RR 17, SpO2 99 % on  room air. Height 5'7", weight 149 lbs (67.586 kg).   Cardiac monitoring: Telemetry box 5W # 32 in place. Second verified by [employer]ame[employer] Oiler  IV access: RAC-saline locked on arrival from ED; condition patent and no redness.  Skin: intact, no pressure ulcer noted in sacral area. Abrasions and bruising noted on bilateral arms and legs and bruising on the right eye.  Patient's ID armband verified with patient and in place. Information packet given to patient. Fall risk assessed, SR up X2, patient able to verbalize understanding of risks associated with falls and to call nurse or staff to assist before  getting out of bed. Patient oriented to room and equipment. Call bell within reach.

## 2017-02-22 NOTE — Evaluation (Addendum)
Occupational Therapy Evaluation Patient Details Name: Marissa Bowen MRN: [id number] DOB: 12-Mar-1927 Today's Date: 02/22/2017    History of Present Illness 83 yo female with syncopal episode at home, admitted and was cleared for new stroke, suspected TIA.  PMHx:  CAD, a-fib, depression, HTN, TIA, cognitive changes,    Clinical Impression   PTA, pt was living on her own and ambulating with RW. Pt reports multiple falls at home and neglecting to use her RW. She also reports feeling unsafe taking a shower when she is at home alone. Pt requires min assist overall for toilet transfers, standing grooming tasks, and LB ADL. She presents with cognitive deficits, poor safety awareness, and decreased balance placing her at significant risk for falling. Pt requires consistent cueing to safety utilize RW and demonstrates difficulty navigating her environment without running into furniture. She would benefit from continued OT services while admitted to improve independence and safety with ADL and functional mobility. Recommend SNF level rehabilitation post-acute D/C to maximize return to independence.     Follow Up Recommendations  SNF;Supervision/Assistance - 24 hour    Equipment Recommendations  Other (comment) (TBD at next venue of care)    Recommendations for Other Services       Precautions / Restrictions Precautions Precautions: Fall Precaution Comments: impulsivity with movement Restrictions Weight Bearing Restrictions: No      Mobility Bed Mobility Overal bed mobility: Needs Assistance Bed Mobility: Supine to Sit;Sit to Supine     Supine to sit: Min guard;HOB elevated     General bed mobility comments: Min guard assist for supine to sit for safety.   Transfers Overall transfer level: Needs assistance Equipment used: Rolling walker (2 wheeled) Transfers: Sit to/from Stand Sit to Stand: Min assist         General transfer comment: VC's for hand placement requiring multiple  repetitions of cue.     Balance Overall balance assessment: History of Falls;Needs assistance Sitting-balance support: Feet supported;Bilateral upper extremity supported Sitting balance-Leahy Scale: Fair     Standing balance support: Bilateral upper extremity supported;During functional activity Standing balance-Leahy Scale: Poor Standing balance comment: Reliant on UE support.                            ADL either performed or assessed with clinical judgement   ADL Overall ADL's : Needs assistance/impaired Eating/Feeding: Supervision/ safety;Sitting   Grooming: Minimal assistance;Standing   Upper Body Bathing: Min guard;Sitting   Lower Body Bathing: Minimal assistance;Sit to/from stand   Upper Body Dressing : Min guard;Sitting   Lower Body Dressing: Minimal assistance;Sit to/from stand   Toilet Transfer: Minimal assistance;Ambulation;BSC;RW;Cueing for safety Toilet Transfer Details (indicate cue type and reason): Simulated in room Toileting- Clothing Manipulation and Hygiene: Minimal assistance;Sit to/from stand       Functional mobility during ADLs: Minimal assistance;Rolling walker General ADL Comments: Pt very unsteady on her feet, running into furniture, easily distracted by external stimuli, and attempting to leave her walker behind multiple times. Poor safety awareness.      Vision Patient Visual Report: No change from baseline Additional Comments: Need to further assess. Able to track, cross midline, and identify objects this session.      Perception     Praxis      Pertinent Vitals/Pain Pain Assessment: No/denies pain     Hand Dominance Right   Extremity/Trunk Assessment Upper Extremity Assessment Upper Extremity Assessment: Generalized weakness   Lower Extremity Assessment Lower Extremity Assessment: Defer  to PT evaluation   Cervical / Trunk Assessment Cervical / Trunk Assessment: Kyphotic   Communication Communication Communication:  No difficulties   Cognition Arousal/Alertness: Awake/alert Behavior During Therapy: Impulsive Overall Cognitive Status: Impaired/Different from baseline Area of Impairment: Orientation;Attention;Following commands;Safety/judgement;Awareness;Problem solving                 Orientation Level: Disoriented to;Situation Current Attention Level: Sustained   Following Commands: Follows one step commands inconsistently;Follows one step commands with increased time Safety/Judgement: Decreased awareness of safety;Decreased awareness of deficits Awareness: Intellectual Problem Solving: Slow processing;Decreased initiation;Difficulty sequencing;Requires verbal cues General Comments: Pt able to state the date and her location when I asked and stated "my son just told me that". However, son later pulled me aside and reported that a few minutes prior to my arrival, pt had asked where she was and what day it was.    General Comments       Exercises     Shoulder Instructions      Home Living Family/patient expects to be discharged to:: Skilled nursing facility Living Arrangements: Alone Available Help at Discharge: Family;Available PRN/intermittently Type of Home: House Home Access: Stairs to enter [employer] of Steps: 1 Entrance Stairs-Rails: None Home Layout: One level     Bathroom Shower/Tub: Walk-in shower;Tub/shower unit   Bathroom Toilet: Standard     Home Equipment: Environmental consultant - 2 wheels;Cane - single point;Shower seat;Grab bars - tub/shower   Additional Comments: multiple cats, maybe 5      Prior Functioning/Environment Level of Independence: Independent        Comments: Has been taking care of herself at home but not bathing much because she does not feel safe. Reports multiple falls. Son reports to OT significant concern over pt's safety at home.         OT Problem List: Decreased strength;Decreased activity tolerance;Impaired balance (sitting and/or  standing);Decreased safety awareness;Decreased knowledge of use of DME or AE;Decreased knowledge of precautions;Decreased cognition      OT Treatment/Interventions: Self-care/ADL training;Therapeutic exercise;Energy conservation;DME and/or AE instruction;Therapeutic activities;Patient/family education;Balance training    OT Goals(Current goals can be found in the care plan section) Acute Rehab OT Goals Patient Stated Goal: to get back to home OT Goal Formulation: With patient Time For Goal Achievement: 03/08/17 Potential to Achieve Goals: Good ADL Goals Pt Will Perform Grooming: with modified independence;standing Pt Will Perform Upper Body Dressing: with modified independence;sitting Pt Will Perform Lower Body Dressing: with supervision;sit to/from stand Pt Will Transfer to Toilet: ambulating;bedside commode;with supervision Pt Will Perform Toileting - Clothing Manipulation and hygiene: with supervision;sit to/from stand  OT Frequency: Min 2X/week   Barriers to D/C:            Co-evaluation              AM-PAC PT "6 Clicks" Daily Activity     Outcome Measure Help from another person eating meals?: A Little Help from another person taking care of personal grooming?: A Little Help from another person toileting, which includes using toliet, bedpan, or urinal?: A Little Help from another person bathing (including washing, rinsing, drying)?: A Little Help from another person to put on and taking off regular upper body clothing?: A Little Help from another person to put on and taking off regular lower body clothing?: A Little 6 Click Score: 18   End of Session Equipment Utilized During Treatment: Rolling walker;Gait belt Nurse Communication: Mobility status (pt sitting in chair)  Activity Tolerance: Patient tolerated treatment well Patient left: in  chair;with call bell/phone within reach;with family/visitor present  OT Visit Diagnosis: Other abnormalities of gait and mobility  (R26.89);Muscle weakness (generalized) (M62.81);Other symptoms and signs involving cognitive function                Time: 3212-2482 OT Time Calculation (min): 22 min Charges:  OT General Charges $OT Visit: 1 Visit OT Evaluation $OT Eval Moderate Complexity: 1 Mod G-Codes: OT G-codes **NOT FOR INPATIENT CLASS** Functional Assessment Tool Used: Clinical judgement Functional Limitation: Self care Self Care Current Status (N0037): At least 20 percent but less than 40 percent impaired, limited or restricted Self Care Goal Status (C4888): At least 1 percent but less than 20 percent impaired, limited or restricted   Norman Herrlich, [place] OTR/L  Pager: [place] 02/22/2017, 5:27 PM

## 2017-02-22 NOTE — Progress Notes (Addendum)
PROGRESS NOTE    Marissa Bowen  GNF:[id number] DOB: [birth year] DOA: 02/21/2017 PCP: Gildardo Cranker, DO     Brief Narrative:  Marissa Bowen is a 83 yo female who lives alone at home, medical history of paroxysmal A. 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 Per daughter, patient was found at home on the floor. She believes that patient was down for about 5-6 hours. She could not get into the house and police was called. Patient was found to be very weak, presented to the emergency department. She has been admitted for possible TIA versus stroke workup.   Assessment & Plan:   Principal Problem:   Weakness Active Problems:   CAD (coronary artery disease)   GERD   Atrial fibrillation (HCC)   Depression with anxiety   Essential hypertension   On amiodarone therapy   TIA (transient ischemic attack)   Weakness -Concern for stroke, stroke team was called on admission -MRI/MRA without acute process, showed progressive severe chronic small vessel ischemic disease. Old right thalamus lacunar infarcts, intracranial atherosclerosis, severe stenosis left A2 segment. -Echo -Carotid ultrasound -Patient apparently does not tolerate statin, continue zetia  -PT OT eval, suspect she will need SNF   Paroxysmal A. fib -Continue amiodarone, eliquis  Essential hypertension -Continue losartan  Hypokalemia -Replace, trend   Dementia -No formal diagnosis of dementia, however, per daughter, patient has had worsening cognitive deficits over time. Patient not safe to live at home alone. -MMSE 21/30 indicating mild cognitive impairment   Depression -Continue cymbalta    DVT prophylaxis: Eliquis Code Status: Full Family Communication: Daughter over the phone 10/27 Disposition Plan: Pending improvement, will likely need SNF placement    Consultants:   Neurology  Procedures:   None   Antimicrobials:  Anti-infectives    None       Subjective: Patient is  hallucinating and confused this morning. She points the nurse states that Jacqlyn Larsen is right there (per daughter, Jacqlyn Larsen is patient's daughter who resides in [place] currently). . She has no acute complaints, is not able to tell me history of presenting illness.  Objective: Vitals:   02/22/17 0334 02/22/17 0652 02/22/17 0819 02/22/17 0824  BP: (!) 122/51 138/60 (!) 168/70 (!) 146/64  Pulse: 71 74 77   Resp: 17 18 18    Temp:  98.5 F (36.9 C) 98.9 F (37.2 C)   TempSrc:  Oral Oral   SpO2: 97% 97% 96%   Weight:      Height:        Intake/Output Summary (Last 24 hours) at 02/22/17 1250 Last data filed at 02/22/17 0600  Gross per 24 hour  Intake           261.67 ml  Output                0 ml  Net           261.67 ml   Filed Weights   02/21/17 1752 02/21/17 2206  Weight: 67.6 kg (149 lb) 67.6 kg (149 lb)    Examination:  General exam: Appears calm and comfortable  Respiratory system: Clear to auscultation. Respiratory effort normal. Cardiovascular system: S1 & S2 heard. No JVD, murmurs, rubs, gallops or clicks. No pedal edema. Gastrointestinal system: Abdomen is nondistended, soft and nontender. No organomegaly or masses felt. Normal bowel sounds heard. Central nervous system: Alert, CN 2-12 grossly unremarkable, RLE weakness compared to left, speech normal  Extremities: Symmetric Skin: No rashes, lesions or ulcers Psychiatry: +  Confused, hallucinations but does follow commands appropriately   Data Reviewed: I have personally reviewed following labs and imaging studies  CBC:  Recent Labs Lab 02/21/17 1813 02/21/17 1817 02/22/17 0742  WBC 14.3*  --  10.5  NEUTROABS 12.8*  --   --   HGB 14.2 14.3 12.7  HCT 42.3 42.0 39.1  MCV 90.2  --  91.1  PLT 210  --  497   Basic Metabolic Panel:  Recent Labs Lab 02/21/17 1813 02/21/17 1817 02/22/17 0742  NA 140 138 137  K 3.7 3.6 3.3*  CL 98* 97* 100*  CO2 28  --  26  GLUCOSE 123* 122* 87  BUN 16 16 13   CREATININE 0.53  0.50 0.66  CALCIUM 9.6  --  9.1   GFR: Estimated Creatinine Clearance: 47.3 mL/min (by C-G formula based on SCr of 0.66 mg/dL). Liver Function Tests:  Recent Labs Lab 02/21/17 1813 02/22/17 0742  AST 33 28  ALT 18 17  ALKPHOS 93 82  BILITOT 1.1 1.3*  PROT 7.4 6.4*  ALBUMIN 4.1 3.5   No results for input(s): LIPASE, AMYLASE in the last 168 hours. No results for input(s): AMMONIA in the last 168 hours. Coagulation Profile:  Recent Labs Lab 02/21/17 1813  INR 1.13   Cardiac Enzymes: No results for input(s): CKTOTAL, CKMB, CKMBINDEX, TROPONINI in the last 168 hours. BNP (last 3 results) No results for input(s): PROBNP in the last 8760 hours. HbA1C:  Recent Labs  02/22/17 0742  HGBA1C 5.2   CBG:  Recent Labs Lab 02/21/17 1857  GLUCAP 99   Lipid Profile:  Recent Labs  02/22/17 0742  CHOL 191  HDL 65  LDLCALC 110*  TRIG 82  CHOLHDL 2.9   Thyroid Function Tests: No results for input(s): TSH, T4TOTAL, FREET4, T3FREE, THYROIDAB in the last 72 hours. Anemia Panel: No results for input(s): VITAMINB12, FOLATE, FERRITIN, TIBC, IRON, RETICCTPCT in the last 72 hours. Sepsis Labs: No results for input(s): PROCALCITON, LATICACIDVEN in the last 168 hours.  No results found for this or any previous visit (from the past 240 hour(s)).     Radiology Studies: Ct Cervical Spine Wo Contrast  Result Date: 02/21/2017 CLINICAL DATA:  Fall.  Right-sided weakness. Initial encounter. EXAM: CT CERVICAL SPINE WITHOUT CONTRAST TECHNIQUE: Multidetector CT imaging of the cervical spine was performed without intravenous contrast. Multiplanar CT image reconstructions were also generated. COMPARISON:  None. FINDINGS: Alignment: No traumatic malalignment. Skull base and vertebrae: Negative for fracture. Soft tissues and spinal canal: No prevertebral fluid or swelling. No visible canal hematoma. Disc levels: Diffuse disc degeneration and facet spurring. No evidence cord impingement.  Upper chest: No acute finding IMPRESSION: No evidence of cervical spine injury. Electronically Signed   By: Monte Fantasia M.D.   On: 02/21/2017 18:43   Mr Brain Wo Contrast  Result Date: 02/22/2017 CLINICAL DATA:  Found down at home. Leg weakness, speech difficulties. History of atrial fibrillation, hypertension, hyperlipidemia, carotid artery disease. EXAM: MRI HEAD WITHOUT CONTRAST MRA HEAD WITHOUT CONTRAST TECHNIQUE: Multiplanar, multiecho pulse sequences of the brain and surrounding structures were obtained without intravenous contrast. Angiographic images of the head were obtained using MRA technique without contrast. COMPARISON:  CT HEAD February 21, 2017 at 1822 hours and MRI/MRA head September 28, 2009 FINDINGS: MRI HEAD FINDINGS BRAIN: No reduced diffusion to suggest acute ischemia. LEFT posterior temporal chronic microhemorrhage. Confluent supratentorial and patchy pontine white matter FLAIR T2 hyperintensities worse than prior MRI. Faint T2 hyperintensities bilateral basal ganglia and bowel  eye associated with chronic small vessel ischemic disease. Old RIGHT thalamus lacunar infarcts. VASCULAR: Normal major intracranial vascular flow voids present at skull base. SKULL AND UPPER CERVICAL SPINE: No abnormal sellar expansion. No suspicious calvarial bone marrow signal. Craniocervical junction maintained. SINUSES/ORBITS: The mastoid air-cells and included paranasal sinuses are well-aerated. The included ocular globes and orbital contents are non-suspicious. Status post bilateral ocular lens implants. OTHER: None. MRA HEAD FINDINGS ANTERIOR CIRCULATION: Normal flow related enhancement of the included cervical, petrous, cavernous and supraclinoid internal carotid arteries. Patent anterior communicating artery. Patent anterior and middle cerebral arteries, mild luminal irregularity compatible with atherosclerosis. Severe stenosis LEFT A2 segment, moderate stenosis RIGHT A2 segment. No large vessel occlusion,    aneurysm. POSTERIOR CIRCULATION: Codominant vertebral artery's. Similarly tortuous mid basilar artery. Scratch Basilar artery is patent, with normal flow related enhancement of the main branch vessels. Patent bilateral vertebral artery's. Fetal origin on the LEFT, robust RIGHT posterior communicating artery with tiny P1 segment. No large vessel occlusion, flow limiting stenosis,  aneurysm. ANATOMIC VARIANTS: None. Source images and MIP images were reviewed. IMPRESSION: MRI HEAD: 1. No acute intracranial process. 2. Progressed severe chronic small vessel ischemic disease. Old RIGHT thalamus lacunar infarcts. MRA HEAD: 1. No emergent large vessel occlusion. 2. Intracranial atherosclerosis, severe stenosis LEFT A2 segment. Electronically Signed   By: Elon Alas M.D.   On: 02/22/2017 00:40   Mr Virgel Paling MW Contrast  Result Date: 02/22/2017 CLINICAL DATA:  Found down at home. Leg weakness, speech difficulties. History of atrial fibrillation, hypertension, hyperlipidemia, carotid artery disease. EXAM: MRI HEAD WITHOUT CONTRAST MRA HEAD WITHOUT CONTRAST TECHNIQUE: Multiplanar, multiecho pulse sequences of the brain and surrounding structures were obtained without intravenous contrast. Angiographic images of the head were obtained using MRA technique without contrast. COMPARISON:  CT HEAD February 21, 2017 at 1822 hours and MRI/MRA head September 28, 2009 FINDINGS: MRI HEAD FINDINGS BRAIN: No reduced diffusion to suggest acute ischemia. LEFT posterior temporal chronic microhemorrhage. Confluent supratentorial and patchy pontine white matter FLAIR T2 hyperintensities worse than prior MRI. Faint T2 hyperintensities bilateral basal ganglia and bowel eye associated with chronic small vessel ischemic disease. Old RIGHT thalamus lacunar infarcts. VASCULAR: Normal major intracranial vascular flow voids present at skull base. SKULL AND UPPER CERVICAL SPINE: No abnormal sellar expansion. No suspicious calvarial bone marrow  signal. Craniocervical junction maintained. SINUSES/ORBITS: The mastoid air-cells and included paranasal sinuses are well-aerated. The included ocular globes and orbital contents are non-suspicious. Status post bilateral ocular lens implants. OTHER: None. MRA HEAD FINDINGS ANTERIOR CIRCULATION: Normal flow related enhancement of the included cervical, petrous, cavernous and supraclinoid internal carotid arteries. Patent anterior communicating artery. Patent anterior and middle cerebral arteries, mild luminal irregularity compatible with atherosclerosis. Severe stenosis LEFT A2 segment, moderate stenosis RIGHT A2 segment. No large vessel occlusion,   aneurysm. POSTERIOR CIRCULATION: Codominant vertebral artery's. Similarly tortuous mid basilar artery. Scratch Basilar artery is patent, with normal flow related enhancement of the main branch vessels. Patent bilateral vertebral artery's. Fetal origin on the LEFT, robust RIGHT posterior communicating artery with tiny P1 segment. No large vessel occlusion, flow limiting stenosis,  aneurysm. ANATOMIC VARIANTS: None. Source images and MIP images were reviewed. IMPRESSION: MRI HEAD: 1. No acute intracranial process. 2. Progressed severe chronic small vessel ischemic disease. Old RIGHT thalamus lacunar infarcts. MRA HEAD: 1. No emergent large vessel occlusion. 2. Intracranial atherosclerosis, severe stenosis LEFT A2 segment. Electronically Signed   By: Elon Alas M.D.   On: 02/22/2017 00:40   Ct Head Code  Stroke Wo Contrast  Result Date: 02/21/2017 CLINICAL DATA:  Code stroke. Aphasia. Right-sided weakness. Fall on Eliquis. EXAM: CT HEAD WITHOUT CONTRAST TECHNIQUE: Contiguous axial images were obtained from the base of the skull through the vertex without intravenous contrast. COMPARISON:  01/15/2017 FINDINGS: Brain: No evidence of acute infarction, hemorrhage, hydrocephalus, extra-axial collection or mass lesion/mass effect. Advanced low-density throughout the  cerebral white matter. Stable appearance of patchy low-density in the bilateral deep gray nuclei from small vessel ischemia and lacunar infarcts. Suspect chronic pontine small-vessel disease as well. Age normal brain volume. Vascular: Atherosclerotic calcification.  No hyperdense vessel. Skull: No acute finding Sinuses/Orbits: Bilateral cataract resection. ASPECTS [facility] Stroke Program Early CT Score) Not scored given the extent of chronic subcortical injury. These results were called by telephone at the time of interpretation on 02/21/2017 at 6:40 pm to Dr. Milton Ferguson , who verbally acknowledged these results. IMPRESSION: 1. No hemorrhage or acute cortical infarct. 2. Advanced chronic small vessel ischemia in the cerebral white matter and deep gray nuclei. A superimposed infarct would easily be obscured. Electronically Signed   By: Monte Fantasia M.D.   On: 02/21/2017 18:40      Scheduled Meds: . amiodarone  100 mg Oral Daily  . apixaban  5 mg Oral BID  . DULoxetine  60 mg Oral Daily  . ezetimibe  10 mg Oral Daily  . losartan  50 mg Oral BID  . multivitamin  1 tablet Oral BID  . multivitamin with minerals  1 tablet Oral Daily  . niacin  500 mg Oral BID  . pantoprazole  40 mg Oral BID  . potassium chloride  40 mEq Oral Once  . sodium chloride  1 spray Each Nare QHS  . tetrahydrozoline  1 drop Both Eyes BID   Continuous Infusions:   LOS: 0 days    Time spent: 40 minutes   Dessa Phi, DO [facility] [email or web address] Password [email or web address] 02/22/2017, 12:50 PM

## 2017-02-22 NOTE — Evaluation (Signed)
Physical Therapy Evaluation Patient Details Name: Marissa Bowen MRN: [id number] DOB: [birth year] Today's Date: 02/22/2017   History of Present Illness  83 yo female with syncopal episode at home, admitted and was cleared for new stroke, suspected TIA.  PMHx:  CAD, a-fib, depression, HTN, TIA, cognitive changes,   Clinical Impression  Pt was seen for evaluation of mobility with some residual RLE weakness but definite instability in standing added to light headed feelings related to medical issues.  Her plan is to follow acutely form strengthening and balance training to translate into shorter stay at SNF.  Her family is concerned about her ability to stay home and so may need to be in ALF after this stay.  Progress with acute therapy as tolerated.    Follow Up Recommendations SNF    Equipment Recommendations  None recommended by PT    Recommendations for Other Services       Precautions / Restrictions Precautions Precautions: Fall (telemetry) Precaution Comments: impulsivity with movement Restrictions Weight Bearing Restrictions: No  Check orthostatics next visit     Mobility  Bed Mobility Overal bed mobility: Needs Assistance Bed Mobility: Supine to Sit;Sit to Supine     Supine to sit: Min assist Sit to supine: Min assist;Mod assist   General bed mobility comments: assisted trunk mainly to sit up and legs to return to bed as pt follows instructions  Transfers Overall transfer level: Needs assistance Equipment used: 1 person hand held assist;Rolling walker (2 wheeled) Transfers: Sit to/from Stand Sit to Stand: Min assist         General transfer comment: cued hand placement every trial  Ambulation/Gait Ambulation/Gait assistance: Min guard;Min assist Ambulation Distance (Feet): 12 Feet (6 x 2) Assistive device: Rolling walker (2 wheeled);1 person hand held assist Gait Pattern/deviations: Decreased stride length;Step-to pattern;Wide base of support;Trunk  flexed Gait velocity: reduced Gait velocity interpretation: Below normal speed for age/gender General Gait Details: sidestepped up side of bed and sat x 2 when pt became light headed.  Declined to walk her away from bed as pt was suddenly expressing light headed feelings  Stairs            Wheelchair Mobility    Modified Rankin (Stroke Patients Only) Modified Rankin (Stroke Patients Only) Pre-Morbid Rankin Score: Slight disability Modified Rankin: Moderately severe disability     Balance Overall balance assessment: History of Falls;Needs assistance Sitting-balance support: Feet supported;Bilateral upper extremity supported Sitting balance-Leahy Scale: Fair     Standing balance support: Bilateral upper extremity supported;During functional activity Standing balance-Leahy Scale: Poor                               Pertinent Vitals/Pain Pain Assessment: Faces Faces Pain Scale: Hurts even more Pain Location: behind R knee esp but L knee as well Pain Descriptors / Indicators: Tender;Sore Pain Intervention(s): Monitored during session;Repositioned;Other (comment) (spoke to nurse as pt is seeing vasc MD for this)    [place] expects to be discharged to:: Skilled nursing facility Living Arrangements: Alone Available Help at Discharge: Family;Available PRN/intermittently Type of Home: House Home Access: Stairs to enter Entrance Stairs-Rails: None Entrance Stairs-Number of Steps: 1 Home Layout: One level Home Equipment: Walker - 2 wheels;Cane - single point;Shower seat;Grab bars - tub/shower Additional Comments: multiple cats, maybe 5    Prior Function Level of Independence: Independent         Comments: has been taking care of herself and  home but multiple falls     Hand Dominance   Dominant Hand: Right (tends to have some R side neglect)    Extremity/Trunk Assessment   Upper Extremity Assessment Upper Extremity Assessment:  Generalized weakness    Lower Extremity Assessment Lower Extremity Assessment: RLE deficits/detail RLE Deficits / Details: reduced R as compared to L side strength RLE Coordination: decreased fine motor;decreased gross motor    Cervical / Trunk Assessment Cervical / Trunk Assessment: Kyphotic  Communication   Communication: No difficulties  Cognition Arousal/Alertness: Awake/alert Behavior During Therapy: Impulsive Overall Cognitive Status: History of cognitive impairments - at baseline                                 General Comments: family reports her cognition has changed but not diagnosis      General Comments      Exercises     Assessment/Plan    PT Assessment Patient needs continued PT services  PT Problem List Decreased strength;Decreased range of motion;Decreased activity tolerance;Decreased balance;Decreased mobility;Decreased coordination;Decreased cognition;Decreased knowledge of use of DME;Decreased safety awareness;Cardiopulmonary status limiting activity;Decreased skin integrity;Pain       PT Treatment Interventions DME instruction;Gait training;Functional mobility training;Therapeutic activities;Therapeutic exercise;Balance training;Neuromuscular re-education;Patient/family education    PT Goals (Current goals can be found in the Care Plan section)  Acute Rehab PT Goals Patient Stated Goal: to get back to home PT Goal Formulation: With patient/family Time For Goal Achievement: 03/08/17 Potential to Achieve Goals: Fair    Frequency Min 2X/week   Barriers to discharge Inaccessible home environment;Decreased caregiver support home alone with stairs to enter    Co-evaluation               AM-PAC PT "6 Clicks" Daily Activity  Outcome Measure Difficulty turning over in bed (including adjusting bedclothes, sheets and blankets)?: A Lot Difficulty moving from lying on back to sitting on the side of the bed? : Unable Difficulty sitting  down on and standing up from a chair with arms (e.g., wheelchair, bedside commode, etc,.)?: Unable Help needed moving to and from a bed to chair (including a wheelchair)?: A Little Help needed walking in hospital room?: A Little Help needed climbing 3-5 steps with a railing? : Total 6 Click Score: 11    End of Session Equipment Utilized During Treatment: Gait belt Activity Tolerance: Treatment limited secondary to medical complications (Comment);Patient limited by fatigue Patient left: in bed;with call bell/phone within reach;with bed alarm set;with family/visitor present Nurse Communication: Mobility status PT Visit Diagnosis: Unsteadiness on feet (R26.81);Muscle weakness (generalized) (M62.81);Ataxic gait (R26.0);History of falling (Z91.81);Other symptoms and signs involving the nervous system (R29.898);Hemiplegia and hemiparesis;Pain Hemiplegia - Right/Left: Right Hemiplegia - dominant/non-dominant: Dominant Hemiplegia - caused by: Cerebral infarction Pain - Right/Left: Right Pain - part of body: Knee    Time: 1110-1134 PT Time Calculation (min) (ACUTE ONLY): 24 min   Charges:   PT Evaluation $PT Eval Moderate Complexity: 1 Mod PT Treatments $Gait Training: 8-22 mins   PT G Codes:   PT G-Codes **NOT FOR INPATIENT CLASS** Functional Assessment Tool Used: AM-PAC 6 Clicks Basic Mobility;Clinical judgement Functional Limitation: Mobility: Walking and moving around Mobility: Walking and Moving Around Current Status (L3810): At least 60 percent but less than 80 percent impaired, limited or restricted Mobility: Walking and Moving Around Goal Status [id number]): At least 1 percent but less than 20 percent impaired, limited or restricted    Ramond Dial 02/22/2017,  1:29 PM   Mee Hives, PT MS Acute Rehab Dept. Number: [place]

## 2017-02-22 NOTE — Progress Notes (Signed)
  Echocardiogram 2D Echocardiogram has been performed.  Darlina Sicilian M 02/22/2017, 1:15 PM

## 2017-02-23 LAB — BASIC METABOLIC PANEL
ANION GAP: 11 (ref 5–15)
BUN: 10 mg/dL (ref 6–20)
CALCIUM: 8.9 mg/dL (ref 8.9–10.3)
CO2: 26 mmol/L (ref 22–32)
CREATININE: 0.6 mg/dL (ref 0.44–1.00)
Chloride: 100 mmol/L — ABNORMAL LOW (ref 101–111)
GLUCOSE: 93 mg/dL (ref 65–99)
Potassium: 3.7 mmol/L (ref 3.5–5.1)
Sodium: 137 mmol/L (ref 135–145)

## 2017-02-23 LAB — MAGNESIUM: Magnesium: 1.9 mg/dL (ref 1.7–2.4)

## 2017-02-23 MED ORDER — HALOPERIDOL LACTATE 5 MG/ML IJ SOLN
2.0000 mg | Freq: Four times a day (QID) | INTRAMUSCULAR | Status: DC | PRN
  Administered 2017-02-23 (×2): 2 mg via INTRAVENOUS
  Filled 2017-02-23 (×2): qty 1

## 2017-02-23 NOTE — Progress Notes (Signed)
Patient is confused and agitated,out of the bed very frequently.Informed to doctor Maylene Roes .Patient refused to take the medicine and refusing to back to room.

## 2017-02-23 NOTE — Progress Notes (Signed)
PROGRESS NOTE    Marissa Bowen  HYW:[id number] DOB: 12/19/1926 DOA: 02/21/2017 PCP: Gildardo Cranker, DO     Brief Narrative:  Marissa Bowen is a 83 yo female who lives alone at home, medical history of paroxysmal A. 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 Per daughter, patient was found at home on the floor. She believes that patient was down for about 5-6 hours. She could not get into the house and police was called. Patient was found to be very weak, presented to the emergency department. She has been admitted for possible TIA versus stroke workup.   Assessment & Plan:   Principal Problem:   Weakness Active Problems:   CAD (coronary artery disease)   GERD   Atrial fibrillation (HCC)   Depression with anxiety   Essential hypertension   On amiodarone therapy   TIA (transient ischemic attack)   Weakness -Concern for stroke, stroke team was called on admission -MRI/MRA without acute process, showed progressive severe chronic small vessel ischemic disease. Old right thalamus lacunar infarcts, intracranial atherosclerosis, severe stenosis left A2 segment. -Echo without cardiac source of emboli  -Carotid ultrasound with 1-39% stenosis bilaterally, no significant change since 2017  -Patient apparently does not tolerate statin, continue zetia  -PT OT eval, recommending SNF   Paroxysmal A. fib -Continue amiodarone, eliquis  Essential hypertension -Continue losartan  Dementia with behavioral disturbance  -No formal diagnosis of dementia previously, however, per daughter, patient has had worsening cognitive deficits over time. Patient not safe to live at home alone. -MMSE 21/30 indicating mild cognitive impairment  -Sitter, haldol prn for safety   Depression -Continue cymbalta    DVT prophylaxis: Eliquis Code Status: Full Family Communication: Daughter over the phone 10/28  Disposition Plan: Pending improvement, will need SNF placement    Consultants:    Neurology  Procedures:   None   Antimicrobials:  Anti-infectives    None       Subjective: Patient is hallucinating and confused this morning. Has been refusing meds and care, does not want me to examine her. . She states that we are not helping her, keeping her against her will. Per nursing, she has been uncooperative, throwing her telemetry box, wandering the hallway  Objective: Vitals:   02/22/17 0840 02/22/17 2218 02/23/17 0501 02/23/17 0830  BP:  (!) 149/60 (!) 180/71   Pulse:  69 66   Resp:  18 18   Temp:  98.2 F (36.8 C) 98.4 F (36.9 C)   TempSrc:  Oral Oral   SpO2: 96% 97% 99% 99%  Weight:      Height:       No intake or output data in the 24 hours ending 02/23/17 1208 Filed Weights   02/21/17 1752 02/21/17 2206  Weight: 67.6 kg (149 lb) 67.6 kg (149 lb)    Examination:  General exam: Appears calm and comfortable but confused  Skin: No rashes, lesions or ulcers on exposed skin  Psychiatry: +Confused, hallucinations   Refused physical examination this morning    Data Reviewed: I have personally reviewed following labs and imaging studies  CBC:  Recent Labs Lab 02/21/17 1813 02/21/17 1817 02/22/17 0742  WBC 14.3*  --  10.5  NEUTROABS 12.8*  --   --   HGB 14.2 14.3 12.7  HCT 42.3 42.0 39.1  MCV 90.2  --  91.1  PLT 210  --  485   Basic Metabolic Panel:  Recent Labs Lab 02/21/17 1813 02/21/17 1817 02/22/17  3825 02/23/17 0415  NA 140 138 137 137  K 3.7 3.6 3.3* 3.7  CL 98* 97* 100* 100*  CO2 28  --  26 26  GLUCOSE 123* 122* 87 93  BUN 16 16 13 10   CREATININE 0.53 0.50 0.66 0.60  CALCIUM 9.6  --  9.1 8.9  MG  --   --   --  1.9   GFR: Estimated Creatinine Clearance: 47.3 mL/min (by C-G formula based on SCr of 0.6 mg/dL). Liver Function Tests:  Recent Labs Lab 02/21/17 1813 02/22/17 0742  AST 33 28  ALT 18 17  ALKPHOS 93 82  BILITOT 1.1 1.3*  PROT 7.4 6.4*  ALBUMIN 4.1 3.5   No results for input(s): LIPASE, AMYLASE in  the last 168 hours. No results for input(s): AMMONIA in the last 168 hours. Coagulation Profile:  Recent Labs Lab 02/21/17 1813  INR 1.13   Cardiac Enzymes:  Recent Labs Lab 02/22/17 1410  CKTOTAL 278*   BNP (last 3 results) No results for input(s): PROBNP in the last 8760 hours. HbA1C:  Recent Labs  02/22/17 0742  HGBA1C 5.2   CBG:  Recent Labs Lab 02/21/17 1857  GLUCAP 99   Lipid Profile:  Recent Labs  02/22/17 0742  CHOL 191  HDL 65  LDLCALC 110*  TRIG 82  CHOLHDL 2.9   Thyroid Function Tests: No results for input(s): TSH, T4TOTAL, FREET4, T3FREE, THYROIDAB in the last 72 hours. Anemia Panel: No results for input(s): VITAMINB12, FOLATE, FERRITIN, TIBC, IRON, RETICCTPCT in the last 72 hours. Sepsis Labs: No results for input(s): PROCALCITON, LATICACIDVEN in the last 168 hours.  No results found for this or any previous visit (from the past 240 hour(s)).     Radiology Studies: Ct Cervical Spine Wo Contrast  Result Date: 02/21/2017 CLINICAL DATA:  Fall.  Right-sided weakness. Initial encounter. EXAM: CT CERVICAL SPINE WITHOUT CONTRAST TECHNIQUE: Multidetector CT imaging of the cervical spine was performed without intravenous contrast. Multiplanar CT image reconstructions were also generated. COMPARISON:  None. FINDINGS: Alignment: No traumatic malalignment. Skull base and vertebrae: Negative for fracture. Soft tissues and spinal canal: No prevertebral fluid or swelling. No visible canal hematoma. Disc levels: Diffuse disc degeneration and facet spurring. No evidence cord impingement. Upper chest: No acute finding IMPRESSION: No evidence of cervical spine injury. Electronically Signed   By: Monte Fantasia M.D.   On: 02/21/2017 18:43   Mr Brain Wo Contrast  Result Date: 02/22/2017 CLINICAL DATA:  Found down at home. Leg weakness, speech difficulties. History of atrial fibrillation, hypertension, hyperlipidemia, carotid artery disease. EXAM: MRI HEAD  WITHOUT CONTRAST MRA HEAD WITHOUT CONTRAST TECHNIQUE: Multiplanar, multiecho pulse sequences of the brain and surrounding structures were obtained without intravenous contrast. Angiographic images of the head were obtained using MRA technique without contrast. COMPARISON:  CT HEAD February 21, 2017 at 1822 hours and MRI/MRA head September 28, 2009 FINDINGS: MRI HEAD FINDINGS BRAIN: No reduced diffusion to suggest acute ischemia. LEFT posterior temporal chronic microhemorrhage. Confluent supratentorial and patchy pontine white matter FLAIR T2 hyperintensities worse than prior MRI. Faint T2 hyperintensities bilateral basal ganglia and bowel eye associated with chronic small vessel ischemic disease. Old RIGHT thalamus lacunar infarcts. VASCULAR: Normal major intracranial vascular flow voids present at skull base. SKULL AND UPPER CERVICAL SPINE: No abnormal sellar expansion. No suspicious calvarial bone marrow signal. Craniocervical junction maintained. SINUSES/ORBITS: The mastoid air-cells and included paranasal sinuses are well-aerated. The included ocular globes and orbital contents are non-suspicious. Status post bilateral ocular lens  implants. OTHER: None. MRA HEAD FINDINGS ANTERIOR CIRCULATION: Normal flow related enhancement of the included cervical, petrous, cavernous and supraclinoid internal carotid arteries. Patent anterior communicating artery. Patent anterior and middle cerebral arteries, mild luminal irregularity compatible with atherosclerosis. Severe stenosis LEFT A2 segment, moderate stenosis RIGHT A2 segment. No large vessel occlusion,   aneurysm. POSTERIOR CIRCULATION: Codominant vertebral artery's. Similarly tortuous mid basilar artery. Scratch Basilar artery is patent, with normal flow related enhancement of the main branch vessels. Patent bilateral vertebral artery's. Fetal origin on the LEFT, robust RIGHT posterior communicating artery with tiny P1 segment. No large vessel occlusion, flow limiting  stenosis,  aneurysm. ANATOMIC VARIANTS: None. Source images and MIP images were reviewed. IMPRESSION: MRI HEAD: 1. No acute intracranial process. 2. Progressed severe chronic small vessel ischemic disease. Old RIGHT thalamus lacunar infarcts. MRA HEAD: 1. No emergent large vessel occlusion. 2. Intracranial atherosclerosis, severe stenosis LEFT A2 segment. Electronically Signed   By: Elon Alas M.D.   On: 02/22/2017 00:40   Mr Virgel Paling YQ Contrast  Result Date: 02/22/2017 CLINICAL DATA:  Found down at home. Leg weakness, speech difficulties. History of atrial fibrillation, hypertension, hyperlipidemia, carotid artery disease. EXAM: MRI HEAD WITHOUT CONTRAST MRA HEAD WITHOUT CONTRAST TECHNIQUE: Multiplanar, multiecho pulse sequences of the brain and surrounding structures were obtained without intravenous contrast. Angiographic images of the head were obtained using MRA technique without contrast. COMPARISON:  CT HEAD February 21, 2017 at 1822 hours and MRI/MRA head September 28, 2009 FINDINGS: MRI HEAD FINDINGS BRAIN: No reduced diffusion to suggest acute ischemia. LEFT posterior temporal chronic microhemorrhage. Confluent supratentorial and patchy pontine white matter FLAIR T2 hyperintensities worse than prior MRI. Faint T2 hyperintensities bilateral basal ganglia and bowel eye associated with chronic small vessel ischemic disease. Old RIGHT thalamus lacunar infarcts. VASCULAR: Normal major intracranial vascular flow voids present at skull base. SKULL AND UPPER CERVICAL SPINE: No abnormal sellar expansion. No suspicious calvarial bone marrow signal. Craniocervical junction maintained. SINUSES/ORBITS: The mastoid air-cells and included paranasal sinuses are well-aerated. The included ocular globes and orbital contents are non-suspicious. Status post bilateral ocular lens implants. OTHER: None. MRA HEAD FINDINGS ANTERIOR CIRCULATION: Normal flow related enhancement of the included cervical, petrous, cavernous and  supraclinoid internal carotid arteries. Patent anterior communicating artery. Patent anterior and middle cerebral arteries, mild luminal irregularity compatible with atherosclerosis. Severe stenosis LEFT A2 segment, moderate stenosis RIGHT A2 segment. No large vessel occlusion,   aneurysm. POSTERIOR CIRCULATION: Codominant vertebral artery's. Similarly tortuous mid basilar artery. Scratch Basilar artery is patent, with normal flow related enhancement of the main branch vessels. Patent bilateral vertebral artery's. Fetal origin on the LEFT, robust RIGHT posterior communicating artery with tiny P1 segment. No large vessel occlusion, flow limiting stenosis,  aneurysm. ANATOMIC VARIANTS: None. Source images and MIP images were reviewed. IMPRESSION: MRI HEAD: 1. No acute intracranial process. 2. Progressed severe chronic small vessel ischemic disease. Old RIGHT thalamus lacunar infarcts. MRA HEAD: 1. No emergent large vessel occlusion. 2. Intracranial atherosclerosis, severe stenosis LEFT A2 segment. Electronically Signed   By: Elon Alas M.D.   On: 02/22/2017 00:40   Ct Head Code Stroke Wo Contrast  Result Date: 02/21/2017 CLINICAL DATA:  Code stroke. Aphasia. Right-sided weakness. Fall on Eliquis. EXAM: CT HEAD WITHOUT CONTRAST TECHNIQUE: Contiguous axial images were obtained from the base of the skull through the vertex without intravenous contrast. COMPARISON:  01/15/2017 FINDINGS: Brain: No evidence of acute infarction, hemorrhage, hydrocephalus, extra-axial collection or mass lesion/mass effect. Advanced low-density throughout the cerebral white matter.  Stable appearance of patchy low-density in the bilateral deep gray nuclei from small vessel ischemia and lacunar infarcts. Suspect chronic pontine small-vessel disease as well. Age normal brain volume. Vascular: Atherosclerotic calcification.  No hyperdense vessel. Skull: No acute finding Sinuses/Orbits: Bilateral cataract resection. ASPECTS [facility]  Stroke Program Early CT Score) Not scored given the extent of chronic subcortical injury. These results were called by telephone at the time of interpretation on 02/21/2017 at 6:40 pm to Dr. Milton Ferguson , who verbally acknowledged these results. IMPRESSION: 1. No hemorrhage or acute cortical infarct. 2. Advanced chronic small vessel ischemia in the cerebral white matter and deep gray nuclei. A superimposed infarct would easily be obscured. Electronically Signed   By: Monte Fantasia M.D.   On: 02/21/2017 18:40      Scheduled Meds: . amiodarone  100 mg Oral Daily  . apixaban  5 mg Oral BID  . DULoxetine  60 mg Oral Daily  . ezetimibe  10 mg Oral Daily  . losartan  50 mg Oral BID  . multivitamin  1 tablet Oral BID  . multivitamin with minerals  1 tablet Oral Daily  . niacin  500 mg Oral BID  . pantoprazole  40 mg Oral BID  . sodium chloride  1 spray Each Nare QHS  . tetrahydrozoline  1 drop Both Eyes BID   Continuous Infusions:   LOS: 1 day    Time spent: 30 minutes   Dessa Phi, DO [facility] [email or web address] Password [email or web address] 02/23/2017, 12:08 PM

## 2017-02-23 NOTE — Progress Notes (Signed)
Pt is alert to person only. She states that she believes I am holding her in here against her will. When her medications was taken out of the package she stated that she would not take them because I could be giving her the wrong medications. I got charge nurse to try and give pts medications at this time. Will continue to monitor.

## 2017-02-24 MED ORDER — STROKE: EARLY STAGES OF RECOVERY BOOK
Freq: Once | Status: AC
  Administered 2017-02-24: 16:00:00
  Filled 2017-02-24: qty 1

## 2017-02-24 MED ORDER — QUETIAPINE FUMARATE 25 MG PO TABS
25.0000 mg | ORAL_TABLET | Freq: Every day | ORAL | Status: DC
  Administered 2017-02-24: 25 mg via ORAL
  Filled 2017-02-24: qty 1

## 2017-02-24 NOTE — Clinical Social Work Note (Signed)
Clinical Social Work Assessment  Patient Details  Name: Marissa Bowen MRN: [id number] Date of Birth: 09-11-1926  Date of referral:  02/24/17               Reason for consult:  Facility Placement                Permission sought to share information with:  Facility Sport and exercise psychologist, Family Supports Permission granted to share information::  Yes, Verbal Permission Granted  Name::     Radio producer::  SNFs  Relationship::  Daughter  Contact Information:  [phone]  Housing/Transportation Living arrangements for the past 2 months:  Single Family Home Source of Information:  Adult Children Patient Interpreter Needed:  None Criminal Activity/Legal Involvement Pertinent to Current Situation/Hospitalization:  No - Comment as needed Significant Relationships:  Adult Children Lives with:  Self, Adult Children Do you feel safe going back to the place where you live?  No Need for family participation in patient care:  Yes (Comment)  Care giving concerns:  CSW received consult for possible SNF placement at time of discharge. CSW spoke with patient and patient's daughter regarding PT recommendation of SNF placement at time of discharge. Patient's daughter reported that she is currently unable to care for patient at their home given patient's current physical needs and fall risk. Patient's daughter expressed understanding of PT recommendation and is agreeable to SNF placement at time of discharge. CSW to continue to follow and assist with discharge planning needs.   Social Worker assessment / plan:  CSW spoke with patient's daughter concerning possibility of rehab at [facility] before returning home.  Employment status:  Retired Forensic scientist:  Medicare PT Recommendations:  [place] / Referral to community resources:  [place]  Patient/Family's Response to care:  Patient's daughter recognizes need for rehab before returning home and is  agreeable to a SNF in [place]. She reported preference for a private room at [place]. She would also like to investigate some long term facilities for the patient after rehab.   Patient/Family's Understanding of and Emotional Response to Diagnosis, Current Treatment, and Prognosis:  Patient/family is realistic regarding therapy needs and expressed being hopeful for SNF placement. Patient's daughter expressed understanding of CSW role and discharge process as well as medical condition. No questions/concerns about plan or treatment.    Emotional Assessment Appearance:  Appears stated age Attitude/Demeanor/Rapport:  Unable to Assess Affect (typically observed):  Pleasant Orientation:  Oriented to Self, Oriented to Place Alcohol / Substance use:  Not Applicable Psych involvement (Current and /or in the community):  No (Comment)  Discharge Needs  Concerns to be addressed:  Care Coordination Readmission within the last 30 days:  No Current discharge risk:  None Barriers to Discharge:  Continued Medical Work up   [employer], [place] 02/24/2017, 4:26 PM

## 2017-02-24 NOTE — Progress Notes (Signed)
PROGRESS NOTE    YANNET RINCON  LFY:[id number] DOB: 11/06/1926 DOA: 02/21/2017 PCP: Gildardo Cranker, DO     Brief Narrative:  Marissa Bowen is a 83 yo female who lives alone at home, medical history of paroxysmal A. 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 Per daughter, patient was found at home on the floor. She believes that patient was down for about 5-6 hours. She could not get into the house and police was called. Patient was found to be very weak, presented to the emergency department. She has been admitted for possible TIA versus stroke workup.   Assessment & Plan:   Principal Problem:   Weakness Active Problems:   CAD (coronary artery disease)   GERD   Atrial fibrillation (HCC)   Depression with anxiety   Essential hypertension   On amiodarone therapy   TIA (transient ischemic attack)   Weakness -Concern for stroke, stroke team was called on admission -MRI/MRA without acute process, showed progressive severe chronic small vessel ischemic disease. Old right thalamus lacunar infarcts, intracranial atherosclerosis, severe stenosis left A2 segment. -Echo without cardiac source of emboli  -Carotid ultrasound with 1-39% stenosis bilaterally, no significant change since 2017  -Patient apparently does not tolerate statin, continue zetia  -PT OT eval, recommending SNF   Dementia with behavioral disturbance  -No formal diagnosis of dementia previously, however, per daughter, patient has had worsening cognitive deficits over time. Patient not safe to live at home alone. -MMSE 21/30 indicating mild cognitive impairment  -Required haldol and ativan yesterday, currently with telesitter  -Seroquel qhs   Paroxysmal A. fib -Continue amiodarone, eliquis  Essential hypertension -Continue losartan  Depression -Continue cymbalta   Goals of care -Consult palliative care. Patient 83 yo with advanced dementia, currently full code and would benefit from further  discussion with family to determine plan going forward    DVT prophylaxis: Eliquis Code Status: Full Family Communication: No family at bedside, spoke with daughter 10/28  Disposition Plan: Pending improvement, will need SNF placement. Required 2 doses of haldol as well as ativan in the last 24 hours. Currently with telesitter. High fall risk and need frequent orienting. Continue to monitor inpatient off haldol and ativan prn today. Will trial seroquel tonight to see if this stabilizes patient. Consult palliative care    Consultants:   Neurology  Palliative care  Procedures:   None   Antimicrobials:  Anti-infectives    None       Subjective: Patient remains very confused this morning, uncooperative  Objective: Vitals:   02/23/17 0830 02/23/17 1431 02/23/17 2206 02/24/17 0608  BP:  (!) 148/66 138/69 (!) 161/65  Pulse:  72 73 65  Resp:  18 18 18   Temp:  98.1 F (36.7 C) 98.9 F (37.2 C) 98.5 F (36.9 C)  TempSrc:  Oral Oral Oral  SpO2: 99%  96% 94%  Weight:      Height:        Intake/Output Summary (Last 24 hours) at 02/24/17 1210 Last data filed at 02/24/17 0100  Gross per 24 hour  Intake                0 ml  Output              775 ml  Net             -775 ml   Filed Weights   02/21/17 1752 02/21/17 2206  Weight: 67.6 kg (149 lb) 67.6 kg (149 lb)  Examination:  General exam: Appears calm and comfortable but confused  Skin: No rashes, lesions or ulcers on exposed skin  Psychiatry: +Confused  Refused physical examination this morning   Data Reviewed: I have personally reviewed following labs and imaging studies  CBC:  Recent Labs Lab 02/21/17 1813 02/21/17 1817 02/22/17 0742  WBC 14.3*  --  10.5  NEUTROABS 12.8*  --   --   HGB 14.2 14.3 12.7  HCT 42.3 42.0 39.1  MCV 90.2  --  91.1  PLT 210  --  458   Basic Metabolic Panel:  Recent Labs Lab 02/21/17 1813 02/21/17 1817 02/22/17 0742 02/23/17 0415  NA 140 138 137 137  K 3.7 3.6  3.3* 3.7  CL 98* 97* 100* 100*  CO2 28  --  26 26  GLUCOSE 123* 122* 87 93  BUN 16 16 13 10   CREATININE 0.53 0.50 0.66 0.60  CALCIUM 9.6  --  9.1 8.9  MG  --   --   --  1.9   GFR: Estimated Creatinine Clearance: 47.3 mL/min (by C-G formula based on SCr of 0.6 mg/dL). Liver Function Tests:  Recent Labs Lab 02/21/17 1813 02/22/17 0742  AST 33 28  ALT 18 17  ALKPHOS 93 82  BILITOT 1.1 1.3*  PROT 7.4 6.4*  ALBUMIN 4.1 3.5   No results for input(s): LIPASE, AMYLASE in the last 168 hours. No results for input(s): AMMONIA in the last 168 hours. Coagulation Profile:  Recent Labs Lab 02/21/17 1813  INR 1.13   Cardiac Enzymes:  Recent Labs Lab 02/22/17 1410  CKTOTAL 278*   BNP (last 3 results) No results for input(s): PROBNP in the last 8760 hours. HbA1C:  Recent Labs  02/22/17 0742  HGBA1C 5.2   CBG:  Recent Labs Lab 02/21/17 1857  GLUCAP 99   Lipid Profile:  Recent Labs  02/22/17 0742  CHOL 191  HDL 65  LDLCALC 110*  TRIG 82  CHOLHDL 2.9   Thyroid Function Tests: No results for input(s): TSH, T4TOTAL, FREET4, T3FREE, THYROIDAB in the last 72 hours. Anemia Panel: No results for input(s): VITAMINB12, FOLATE, FERRITIN, TIBC, IRON, RETICCTPCT in the last 72 hours. Sepsis Labs: No results for input(s): PROCALCITON, LATICACIDVEN in the last 168 hours.  No results found for this or any previous visit (from the past 240 hour(s)).     Radiology Studies: No results found.    Scheduled Meds: .  stroke: mapping our early stages of recovery book   Does not apply Once  . amiodarone  100 mg Oral Daily  . apixaban  5 mg Oral BID  . DULoxetine  60 mg Oral Daily  . ezetimibe  10 mg Oral Daily  . losartan  50 mg Oral BID  . multivitamin  1 tablet Oral BID  . multivitamin with minerals  1 tablet Oral Daily  . niacin  500 mg Oral BID  . pantoprazole  40 mg Oral BID  . sodium chloride  1 spray Each Nare QHS  . tetrahydrozoline  1 drop Both Eyes BID    Continuous Infusions:   LOS: 2 days    Time spent: 30 minutes   Dessa Phi, DO [facility] [email or web address] Password [email or web address] 02/24/2017, 12:10 PM

## 2017-02-24 NOTE — [place] FL2 (Signed)
[place] MEDICAID FL2 LEVEL OF CARE SCREENING TOOL     IDENTIFICATION  Patient Name: Marissa Bowen Birthdate: 1927-02-26 Sex: female Admission Date (Current Location): 02/21/2017  [facility] and [place] Number:  Herbalist and Address:  [facility], [place] [address] [place], [place] [address]      Provider Number: [id number]  Attending Physician Name and Address:  Dessa Phi Chahn-Yan*  Relative Name and Phone Number:       Current Level of Care: Hospital Recommended Level of Care: [place] Prior Approval Number:    Date Approved/Denied:   PASRR Number: [id number]  Discharge Plan: SNF    Current Diagnoses: Patient Active Problem List   Diagnosis Date Noted  . TIA (transient ischemic attack) 02/21/2017  . GAD (generalized anxiety disorder) 11/19/2016  . Diverticulitis 10/05/2016  . Elevated LFTs 10/04/2016  . SIRS (systemic inflammatory response syndrome) ([place]) 10/04/2016  . Macular degeneration of both eyes 09/22/2015  . Carotid stenosis 08/10/2015  . On amiodarone therapy 08/10/2015  . Chronic cholecystitis with calculus 01/03/2015  . Allergic rhinitis 08/05/2014  . Essential hypertension 05/03/2014  . Osteoarthritis 09/29/2013  . Routine general medical examination at a health care facility 09/29/2013  . Depression with anxiety 08/03/2013  . Atrial fibrillation ([place]) 09/02/2012  . Anemia due to chronic blood loss 08/18/2012  . Diarrhea 06/08/2012  . Orthostatic hypotension 06/08/2012  . ACUTE POSTHEMORRHAGIC ANEMIA 10/23/2009  . BACK PAIN, LUMBAR 09/27/2009  . MUSCLE WEAKNESS (GENERALIZED) 09/27/2009  . IBS 12/07/2008  . Weakness 07/21/2008  . MIGRAINE WITH AURA 08/13/2007  . OSTEOARTHRITIS 08/13/2007  . TRANSIENT ISCHEMIC ATTACK 04/13/2007  . Hyperlipidemia LDL goal <100 11/25/2006  . CAD (coronary artery disease) 11/25/2006  . GERD 11/25/2006  . DIVERTICULITIS, HX OF 11/25/2006    Orientation  RESPIRATION BLADDER Height & Weight     Self  Normal Continent Weight: 149 lb (67.6 kg) Height:  5\' 7"  (170.2 cm)  BEHAVIORAL SYMPTOMS/MOOD NEUROLOGICAL BOWEL NUTRITION STATUS      Continent Diet (see DC summary)  AMBULATORY STATUS COMMUNICATION OF NEEDS Skin   Limited Assist Verbally Normal                       Personal Care Assistance Level of Assistance  Bathing, Dressing Bathing Assistance: Limited assistance   Dressing Assistance: Limited assistance     Functional Limitations Info             SPECIAL CARE FACTORS FREQUENCY  PT (By licensed PT), OT (By licensed OT)     PT Frequency: 5/wk OT Frequency: 5/wk            Contractures      Additional Factors Info  Code Status, Allergies, Psychotropic Code Status Info: FULL Allergies Info: [place] Aspirin, Crestor Rosuvastatin Calcium, Hydrocodone, Ibuprofen, Oxycodone Hcl, Penicillins, Prednisone, Statins, Morphine And Related, Pristiq Desvenlafaxine Psychotropic Info: cymbalta         Current Medications (02/24/2017):  This is the current hospital active medication list Current Facility-Administered Medications  Medication Dose Route Frequency Provider Last Rate Last Dose  . acetaminophen (TYLENOL) tablet 650 mg  650 mg Oral Q4H PRN Jani Gravel, MD       Or  . acetaminophen (TYLENOL) solution 650 mg  650 mg Per Tube Q4H PRN Jani Gravel, MD       Or  . acetaminophen (TYLENOL) suppository 650 mg  650 mg Rectal Q4H PRN Jani Gravel, MD      .  amiodarone (PACERONE) tablet 100 mg  100 mg Oral Daily Jani Gravel, MD   100 mg at 02/23/17 1138  . apixaban (ELIQUIS) tablet 5 mg  5 mg Oral BID Jani Gravel, MD   5 mg at 02/23/17 2118  . DULoxetine (CYMBALTA) DR capsule 60 mg  60 mg Oral Daily Jani Gravel, MD   60 mg at 02/23/17 1145  . ezetimibe (ZETIA) tablet 10 mg  10 mg Oral Daily Jani Gravel, MD   10 mg at 02/23/17 1135  . haloperidol lactate (HALDOL) injection 2 mg  2 mg Intravenous Q6H PRN Dessa Phi Chahn-Yang, DO    2 mg at 02/23/17 2231  . LORazepam (ATIVAN) tablet 1 mg  1 mg Oral Q8H PRN Jani Gravel, MD   1 mg at 02/23/17 2118  . losartan (COZAAR) tablet 50 mg  50 mg Oral BID Jani Gravel, MD   50 mg at 02/23/17 2118  . meclizine (ANTIVERT) tablet 12.5 mg  12.5 mg Oral Q8H PRN Jani Gravel, MD   12.5 mg at 02/23/17 1136  . multivitamin (PROSIGHT) tablet 1 tablet  1 tablet Oral BID Jani Gravel, MD   1 tablet at 02/23/17 2117  . multivitamin with minerals tablet 1 tablet  1 tablet Oral Daily Jani Gravel, MD   1 tablet at 02/23/17 1135  . niacin CR capsule 500 mg  500 mg Oral BID Jani Gravel, MD   500 mg at 02/23/17 2122  . pantoprazole (PROTONIX) EC tablet 40 mg  40 mg Oral BID Jani Gravel, MD   40 mg at 02/23/17 2119  . sodium chloride (OCEAN) 0.65 % nasal spray 1 spray  1 spray Each Nare QHS Jani Gravel, MD   1 spray at 02/23/17 2123  . tetrahydrozoline 0.05 % ophthalmic solution 1 drop  1 drop Both Eyes BID Jani Gravel, MD   1 drop at 02/23/17 2123  . traMADol (ULTRAM) tablet 50 mg  50 mg Oral Q6H PRN Jani Gravel, MD   50 mg at 02/23/17 2119     Discharge Medications: Please see discharge summary for a list of discharge medications.  Relevant Imaging Results:  Relevant Lab Results:   Additional Information SS#: [id number]  Jorge Ny, LCSW

## 2017-02-25 DIAGNOSIS — I951 Orthostatic hypotension: Secondary | ICD-10-CM | POA: Diagnosis not present

## 2017-02-25 DIAGNOSIS — I6523 Occlusion and stenosis of bilateral carotid arteries: Secondary | ICD-10-CM | POA: Diagnosis not present

## 2017-02-25 DIAGNOSIS — F0391 Unspecified dementia with behavioral disturbance: Secondary | ICD-10-CM | POA: Diagnosis not present

## 2017-02-25 DIAGNOSIS — M1990 Unspecified osteoarthritis, unspecified site: Secondary | ICD-10-CM | POA: Diagnosis not present

## 2017-02-25 DIAGNOSIS — Z951 Presence of aortocoronary bypass graft: Secondary | ICD-10-CM | POA: Diagnosis not present

## 2017-02-25 DIAGNOSIS — H04123 Dry eye syndrome of bilateral lacrimal glands: Secondary | ICD-10-CM | POA: Diagnosis not present

## 2017-02-25 DIAGNOSIS — G250 Essential tremor: Secondary | ICD-10-CM | POA: Diagnosis not present

## 2017-02-25 DIAGNOSIS — Z7901 Long term (current) use of anticoagulants: Secondary | ICD-10-CM | POA: Diagnosis not present

## 2017-02-25 DIAGNOSIS — I2510 Atherosclerotic heart disease of native coronary artery without angina pectoris: Secondary | ICD-10-CM | POA: Diagnosis not present

## 2017-02-25 DIAGNOSIS — Z955 Presence of coronary angioplasty implant and graft: Secondary | ICD-10-CM | POA: Diagnosis not present

## 2017-02-25 DIAGNOSIS — I480 Paroxysmal atrial fibrillation: Secondary | ICD-10-CM | POA: Diagnosis not present

## 2017-02-25 DIAGNOSIS — G459 Transient cerebral ischemic attack, unspecified: Secondary | ICD-10-CM | POA: Diagnosis not present

## 2017-02-25 DIAGNOSIS — Z79899 Other long term (current) drug therapy: Secondary | ICD-10-CM | POA: Diagnosis not present

## 2017-02-25 DIAGNOSIS — R040 Epistaxis: Secondary | ICD-10-CM | POA: Diagnosis not present

## 2017-02-25 DIAGNOSIS — F418 Other specified anxiety disorders: Secondary | ICD-10-CM | POA: Diagnosis not present

## 2017-02-25 DIAGNOSIS — R488 Other symbolic dysfunctions: Secondary | ICD-10-CM | POA: Diagnosis not present

## 2017-02-25 DIAGNOSIS — I69398 Other sequelae of cerebral infarction: Secondary | ICD-10-CM | POA: Diagnosis not present

## 2017-02-25 DIAGNOSIS — R296 Repeated falls: Secondary | ICD-10-CM | POA: Diagnosis not present

## 2017-02-25 DIAGNOSIS — R9431 Abnormal electrocardiogram [ECG] [EKG]: Secondary | ICD-10-CM | POA: Diagnosis not present

## 2017-02-25 DIAGNOSIS — R443 Hallucinations, unspecified: Secondary | ICD-10-CM | POA: Diagnosis not present

## 2017-02-25 DIAGNOSIS — R531 Weakness: Secondary | ICD-10-CM | POA: Diagnosis not present

## 2017-02-25 DIAGNOSIS — I10 Essential (primary) hypertension: Secondary | ICD-10-CM | POA: Diagnosis not present

## 2017-02-25 DIAGNOSIS — Z8673 Personal history of transient ischemic attack (TIA), and cerebral infarction without residual deficits: Secondary | ICD-10-CM | POA: Diagnosis not present

## 2017-02-25 DIAGNOSIS — K219 Gastro-esophageal reflux disease without esophagitis: Secondary | ICD-10-CM | POA: Diagnosis not present

## 2017-02-25 DIAGNOSIS — M6281 Muscle weakness (generalized): Secondary | ICD-10-CM | POA: Diagnosis not present

## 2017-02-25 DIAGNOSIS — E785 Hyperlipidemia, unspecified: Secondary | ICD-10-CM | POA: Diagnosis not present

## 2017-02-25 MED ORDER — EZETIMIBE 10 MG PO TABS: 10.0000 mg | ORAL_TABLET | Freq: Every day | ORAL | 0 refills | Status: DC

## 2017-02-25 MED ORDER — LORAZEPAM 1 MG PO TABS: 1.0000 mg | ORAL_TABLET | Freq: Three times a day (TID) | ORAL | 0 refills | Status: DC | PRN

## 2017-02-25 MED ORDER — QUETIAPINE FUMARATE 25 MG PO TABS: 25.0000 mg | ORAL_TABLET | Freq: Every day | ORAL | 0 refills | Status: DC

## 2017-02-25 NOTE — Progress Notes (Signed)
Report called to [facility]  ?

## 2017-02-25 NOTE — Progress Notes (Signed)
Patient refused to have morning vital signs taken and wants to be left alone and refuses for anyone to touch her. Will continue to monitor and treat per MD orders.

## 2017-02-25 NOTE — Progress Notes (Signed)
[name] to be D/C'd Skilled nursing facility per MD order.  Discussed with the patient and all questions fully answered.  VSS, Skin clean, dry and intact without evidence of skin break down, no evidence of skin tears noted. IV catheter discontinued intact. Site without signs and symptoms of complications. Dressing and pressure applied.  An After Visit Summary was printed and given to the patient. Patient received prescription.  D/c education completed with patient/family including follow up instructions, medication list, d/c activities limitations if indicated, with other d/c instructions as indicated by MD - patient able to verbalize understanding, all questions fully answered.   Patient instructed to return to ED, call 911, or call MD for any changes in condition.   Patient escorted via [place], and D/C home via private auto.  Luci Bank 02/25/2017 1:25 PM

## 2017-02-25 NOTE — Progress Notes (Signed)
Patient will DC to: [facility] Anticipated DC date: 02/25/17 Family notified: Daughter Transport by: [employer]   Per MD patient ready for DC to [facility]. RN, patient, patient's family, and facility notified of DC. Discharge Summary sent to facility. RN given number for report [phone]).   CSW signing off.  Cedric Fishman, [place] Social Worker [phone]

## 2017-02-25 NOTE — Discharge Summary (Signed)
Physician Discharge Summary  [name] WYO:[id number] DOB: [birth year] DOA: 02/21/2017  PCP: Gildardo Cranker, DO  Admit date: 02/21/2017 Discharge date: 02/25/2017  Admitted From: Home Disposition:  SNF   Recommendations for Outpatient Follow-up:  1. Follow up with PCP in 1 week 2. Would recommend Palliative Care Medicine referral   Discharge Condition: Stable CODE STATUS: Full  Diet recommendation: Heart healthy   Brief/Interim Summary: Marissa Bowen is a 83 yo female who lives alone at home, medical history of paroxysmal A. 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 Per daughter, patient was found at home on the floor. She believes that patient was down for about 5-6 hours. She could not get into the house and police was called. Patient was found to be very weak, presented to the emergency department. She has been admitted for possible TIA versus stroke workup. MRI did not reveal new stroke. She was evaluated by neurology. Her hospitalization was complicated by dementia with behavioral disturbance. Daughter stated patient lived at home alone prior to this admission. She was evaluated by PT OT who recommended SNF.   Discharge Diagnoses:  Principal Problem:   Weakness Active Problems:   CAD (coronary artery disease)   GERD   Atrial fibrillation (HCC)   Depression with anxiety   Essential hypertension   On amiodarone therapy   TIA (transient ischemic attack)  Weakness -Concern for stroke, stroke team was called on admission -MRI/MRA without acute process, showed progressive severe chronic small vessel ischemic disease. Old right thalamus lacunar infarcts, intracranial atherosclerosis, severe stenosis left A2 segment. -Echo without cardiac source of emboli  -Carotid ultrasound with 1-39% stenosis bilaterally, no significant change since 2017  -Patient apparently does not tolerate statin, continue zetia  -PT OT eval, recommending SNF   Dementia with behavioral  disturbance  -No formal diagnosis of dementia previously, however, per daughter, patient has had worsening cognitive deficits over time. Patient not safe to live at home alone. -MMSE 21/30 indicating mild cognitive impairment  -Seroquel qhs   Paroxysmal A. fib -Continue amiodarone, eliquis  Essential hypertension -Continue losartan  Depression -Continue cymbalta   Goals of care -Consult palliative care. Patient 83 yo with advanced dementia, currently full code and would benefit from further discussion with family to determine plan going forward.    Discharge Instructions  Discharge Instructions    Call MD for:  difficulty breathing, headache or visual disturbances    Complete by:  As directed    Call MD for:  extreme fatigue    Complete by:  As directed    Call MD for:  hives    Complete by:  As directed    Call MD for:  persistant dizziness or light-headedness    Complete by:  As directed    Call MD for:  persistant nausea and vomiting    Complete by:  As directed    Call MD for:  severe uncontrolled pain    Complete by:  As directed    Call MD for:  temperature >100.4    Complete by:  As directed    Diet - low sodium heart healthy    Complete by:  As directed    Discharge instructions    Complete by:  As directed    You were cared for by a hospitalist during your hospital stay. If you have any questions about your discharge medications or the care you received while you were in the hospital after you are discharged, you can call  the unit and asked to speak with the hospitalist on call if the hospitalist that took care of you is not available. Once you are discharged, your primary care physician will handle any further medical issues. Please note that NO REFILLS for any discharge medications will be authorized once you are discharged, as it is imperative that you return to your primary care physician (or establish a relationship with a primary care physician if you do not  have one) for your aftercare needs so that they can reassess your need for medications and monitor your lab values.   Increase activity slowly    Complete by:  As directed      Allergies as of 02/25/2017      Reactions   Asa [aspirin] Other (See Comments)   Bleeding ulcer   Crestor [rosuvastatin Calcium] Other (See Comments)   myalgia   Hydrocodone Other (See Comments)   REACTION: migraines   Ibuprofen Other (See Comments)   ulcers   Oxycodone Hcl Other (See Comments)   REACTION: migraines   Penicillins Swelling, Rash   Has patient had a PCN reaction causing immediate rash, facial/tongue/throat swelling, SOB or lightheadedness with hypotension: Yes Has patient had a PCN reaction causing severe rash involving mucus membranes or skin necrosis: No Has patient had a PCN reaction that required hospitalization No Has patient had a PCN reaction occurring within the last 10 years: No If all of the above answers are "NO", then may proceed with Cephalosporin use.   Prednisone Other (See Comments)   ulcers   Statins Other (See Comments)   Aches and pains   Morphine And Related Other (See Comments)    Had hallucinations with morphine sulfate in the past     Pristiq [desvenlafaxine] Other (See Comments)   Drowsiness and hangover sensation      Medication List    STOP taking these medications   traMADol 50 MG tablet Commonly known as:  ULTRAM   zolpidem 10 MG tablet Commonly known as:  AMBIEN     TAKE these medications   acetaminophen 500 MG tablet Commonly known as:  TYLENOL Take 500-1,000 mg by mouth every 6 (six) hours as needed for moderate pain.   amiodarone 200 MG tablet Commonly known as:  PACERONE TAKE ONE-HALF TABLET DAILY What changed:  See the new instructions.   DULoxetine 60 MG capsule Commonly known as:  CYMBALTA Take 1 capsule (60 mg total) by mouth daily. For mood and chronic pain   ELIQUIS 5 MG Tabs tablet Generic drug:  apixaban Take 1 tablet (5 mg  total) by mouth 2 (two) times daily.   ezetimibe 10 MG tablet Commonly known as:  ZETIA Take 1 tablet (10 mg total) by mouth daily.   LORazepam 1 MG tablet Commonly known as:  ATIVAN Take 1 tablet (1 mg total) by mouth every 8 (eight) hours as needed for anxiety. What changed:  See the new instructions.   losartan 50 MG tablet Commonly known as:  COZAAR TAKE ONE TABLET TWICE DAILY What changed:  See the new instructions.   meclizine 12.5 MG tablet Commonly known as:  ANTIVERT TAKE ONE OR TWO TABLETS EVERY EIGHT HOURS AS NEEDED FOR DIZZINESS   multivitamin with minerals Tabs tablet Take 1 tablet by mouth daily.   niacin 500 MG CR capsule Take 500 mg by mouth 2 (two) times daily.   pantoprazole 40 MG tablet Commonly known as:  PROTONIX TAKE ONE TABLET TWICE DAILY What changed:  See the new instructions.  PRESERVISION AREDS Tabs Take 1 tablet by mouth 2 (two) times daily.   QUEtiapine 25 MG tablet Commonly known as:  SEROQUEL Take 1 tablet (25 mg total) by mouth at bedtime.   sodium chloride 0.65 % Soln nasal spray Commonly known as:  OCEAN Place 1 spray into both nostrils at bedtime.   VISINE OP Place 2 drops into both eyes 2 (two) times daily as needed (for dry eyes).      Follow-up Information    Gildardo Cranker, DO. Schedule an appointment as soon as possible for a visit in 1 week(s).   Specialty:  Internal Medicine Contact information: [place] [id number] [phone]          Allergies  Allergen Reactions  . Asa [Aspirin] Other (See Comments)    Bleeding ulcer  . Crestor [Rosuvastatin Calcium] Other (See Comments)    myalgia  . Hydrocodone Other (See Comments)    REACTION: migraines  . Ibuprofen Other (See Comments)    ulcers  . Oxycodone Hcl Other (See Comments)    REACTION: migraines  . Penicillins Swelling and Rash    Has patient had a PCN reaction causing immediate rash, facial/tongue/throat swelling, SOB or  lightheadedness with hypotension: Yes Has patient had a PCN reaction causing severe rash involving mucus membranes or skin necrosis: No Has patient had a PCN reaction that required hospitalization No Has patient had a PCN reaction occurring within the last 10 years: No If all of the above answers are "NO", then may proceed with Cephalosporin use.   . Prednisone Other (See Comments)    ulcers  . Statins Other (See Comments)    Aches and pains  . Morphine And Related Other (See Comments)     Had hallucinations with morphine sulfate in the past    . Pristiq [Desvenlafaxine] Other (See Comments)    Drowsiness and hangover sensation    Consultations:  Neurology   Procedures/Studies: Ct Cervical Spine Wo Contrast  Result Date: 02/21/2017 CLINICAL DATA:  Fall.  Right-sided weakness. Initial encounter. EXAM: CT CERVICAL SPINE WITHOUT CONTRAST TECHNIQUE: Multidetector CT imaging of the cervical spine was performed without intravenous contrast. Multiplanar CT image reconstructions were also generated. COMPARISON:  None. FINDINGS: Alignment: No traumatic malalignment. Skull base and vertebrae: Negative for fracture. Soft tissues and spinal canal: No prevertebral fluid or swelling. No visible canal hematoma. Disc levels: Diffuse disc degeneration and facet spurring. No evidence cord impingement. Upper chest: No acute finding IMPRESSION: No evidence of cervical spine injury. Electronically Signed   By: Monte Fantasia M.D.   On: 02/21/2017 18:43   Mr Brain Wo Contrast  Result Date: 02/22/2017 CLINICAL DATA:  Found down at home. Leg weakness, speech difficulties. History of atrial fibrillation, hypertension, hyperlipidemia, carotid artery disease. EXAM: MRI HEAD WITHOUT CONTRAST MRA HEAD WITHOUT CONTRAST TECHNIQUE: Multiplanar, multiecho pulse sequences of the brain and surrounding structures were obtained without intravenous contrast. Angiographic images of the head were obtained using MRA technique  without contrast. COMPARISON:  CT HEAD February 21, 2017 at 1822 hours and MRI/MRA head September 28, 2009 FINDINGS: MRI HEAD FINDINGS BRAIN: No reduced diffusion to suggest acute ischemia. LEFT posterior temporal chronic microhemorrhage. Confluent supratentorial and patchy pontine white matter FLAIR T2 hyperintensities worse than prior MRI. Faint T2 hyperintensities bilateral basal ganglia and bowel eye associated with chronic small vessel ischemic disease. Old RIGHT thalamus lacunar infarcts. VASCULAR: Normal major intracranial vascular flow voids present at skull base. SKULL AND UPPER CERVICAL SPINE: No abnormal sellar  expansion. No suspicious calvarial bone marrow signal. Craniocervical junction maintained. SINUSES/ORBITS: The mastoid air-cells and included paranasal sinuses are well-aerated. The included ocular globes and orbital contents are non-suspicious. Status post bilateral ocular lens implants. OTHER: None. MRA HEAD FINDINGS ANTERIOR CIRCULATION: Normal flow related enhancement of the included cervical, petrous, cavernous and supraclinoid internal carotid arteries. Patent anterior communicating artery. Patent anterior and middle cerebral arteries, mild luminal irregularity compatible with atherosclerosis. Severe stenosis LEFT A2 segment, moderate stenosis RIGHT A2 segment. No large vessel occlusion,   aneurysm. POSTERIOR CIRCULATION: Codominant vertebral artery's. Similarly tortuous mid basilar artery. Scratch Basilar artery is patent, with normal flow related enhancement of the main branch vessels. Patent bilateral vertebral artery's. Fetal origin on the LEFT, robust RIGHT posterior communicating artery with tiny P1 segment. No large vessel occlusion, flow limiting stenosis,  aneurysm. ANATOMIC VARIANTS: None. Source images and MIP images were reviewed. IMPRESSION: MRI HEAD: 1. No acute intracranial process. 2. Progressed severe chronic small vessel ischemic disease. Old RIGHT thalamus lacunar infarcts. MRA  HEAD: 1. No emergent large vessel occlusion. 2. Intracranial atherosclerosis, severe stenosis LEFT A2 segment. Electronically Signed   By: Elon Alas M.D.   On: 02/22/2017 00:40   Mr Virgel Paling FG Contrast  Result Date: 02/22/2017 CLINICAL DATA:  Found down at home. Leg weakness, speech difficulties. History of atrial fibrillation, hypertension, hyperlipidemia, carotid artery disease. EXAM: MRI HEAD WITHOUT CONTRAST MRA HEAD WITHOUT CONTRAST TECHNIQUE: Multiplanar, multiecho pulse sequences of the brain and surrounding structures were obtained without intravenous contrast. Angiographic images of the head were obtained using MRA technique without contrast. COMPARISON:  CT HEAD February 21, 2017 at 1822 hours and MRI/MRA head September 28, 2009 FINDINGS: MRI HEAD FINDINGS BRAIN: No reduced diffusion to suggest acute ischemia. LEFT posterior temporal chronic microhemorrhage. Confluent supratentorial and patchy pontine white matter FLAIR T2 hyperintensities worse than prior MRI. Faint T2 hyperintensities bilateral basal ganglia and bowel eye associated with chronic small vessel ischemic disease. Old RIGHT thalamus lacunar infarcts. VASCULAR: Normal major intracranial vascular flow voids present at skull base. SKULL AND UPPER CERVICAL SPINE: No abnormal sellar expansion. No suspicious calvarial bone marrow signal. Craniocervical junction maintained. SINUSES/ORBITS: The mastoid air-cells and included paranasal sinuses are well-aerated. The included ocular globes and orbital contents are non-suspicious. Status post bilateral ocular lens implants. OTHER: None. MRA HEAD FINDINGS ANTERIOR CIRCULATION: Normal flow related enhancement of the included cervical, petrous, cavernous and supraclinoid internal carotid arteries. Patent anterior communicating artery. Patent anterior and middle cerebral arteries, mild luminal irregularity compatible with atherosclerosis. Severe stenosis LEFT A2 segment, moderate stenosis RIGHT A2  segment. No large vessel occlusion,   aneurysm. POSTERIOR CIRCULATION: Codominant vertebral artery's. Similarly tortuous mid basilar artery. Scratch Basilar artery is patent, with normal flow related enhancement of the main branch vessels. Patent bilateral vertebral artery's. Fetal origin on the LEFT, robust RIGHT posterior communicating artery with tiny P1 segment. No large vessel occlusion, flow limiting stenosis,  aneurysm. ANATOMIC VARIANTS: None. Source images and MIP images were reviewed. IMPRESSION: MRI HEAD: 1. No acute intracranial process. 2. Progressed severe chronic small vessel ischemic disease. Old RIGHT thalamus lacunar infarcts. MRA HEAD: 1. No emergent large vessel occlusion. 2. Intracranial atherosclerosis, severe stenosis LEFT A2 segment. Electronically Signed   By: Elon Alas M.D.   On: 02/22/2017 00:40   Ct Head Code Stroke Wo Contrast  Result Date: 02/21/2017 CLINICAL DATA:  Code stroke. Aphasia. Right-sided weakness. Fall on Eliquis. EXAM: CT HEAD WITHOUT CONTRAST TECHNIQUE: Contiguous axial images were obtained from the base  of the skull through the vertex without intravenous contrast. COMPARISON:  01/15/2017 FINDINGS: Brain: No evidence of acute infarction, hemorrhage, hydrocephalus, extra-axial collection or mass lesion/mass effect. Advanced low-density throughout the cerebral white matter. Stable appearance of patchy low-density in the bilateral deep gray nuclei from small vessel ischemia and lacunar infarcts. Suspect chronic pontine small-vessel disease as well. Age normal brain volume. Vascular: Atherosclerotic calcification.  No hyperdense vessel. Skull: No acute finding Sinuses/Orbits: Bilateral cataract resection. ASPECTS [facility] Stroke Program Early CT Score) Not scored given the extent of chronic subcortical injury. These results were called by telephone at the time of interpretation on 02/21/2017 at 6:40 pm to Dr. Milton Ferguson , who verbally acknowledged these  results. IMPRESSION: 1. No hemorrhage or acute cortical infarct. 2. Advanced chronic small vessel ischemia in the cerebral white matter and deep gray nuclei. A superimposed infarct would easily be obscured. Electronically Signed   By: Monte Fantasia M.D.   On: 02/21/2017 18:40    Echo Study Conclusions  - Left ventricle: The cavity size was normal. Wall thickness was   increased increased in a pattern of mild to moderate LVH.   Systolic function was normal. The estimated ejection fraction was   in the range of 60% to 65%. Wall motion was normal; there were no   regional wall motion abnormalities. Doppler parameters are   consistent with abnormal left ventricular relaxation (grade 1   diastolic dysfunction). - Pulmonary arteries: Systolic pressure was mildly increased. PA   peak pressure: 32 mm Hg (S).  Impressions:  - No cardiac source of emboli was indentified.   Carotid US Final Interpretation: Right Carotid: There is evidence in the right ICA of a 1-39% stenosis. No        significant change from study of March 2017  Left Carotid: There is evidence in the left ICA of a 1-39% stenosis. No       significant change from study of March 2017  Vertebrals: Both vertebral arteries were patent with antegrade flow. Subclavians: Normal flow hemodynamics were seen in bilateral subclavian       arteries.   Discharge Exam: Vitals:   02/24/17 2032 02/25/17 0824  BP: (!) 136/49 (!) 171/63  Pulse: 66 67  Resp: 18 20  Temp: 98.6 F (37 C) 98.2 F (36.8 C)  SpO2: 95% 96%   Vitals:   02/24/17 0608 02/24/17 1525 02/24/17 2032 02/25/17 0824  BP: (!) 161/65 (!) 163/62 (!) 136/49 (!) 171/63  Pulse: 65 65 66 67  Resp: 18 18 18 20   Temp: 98.5 F (36.9 C) 98.2 F (36.8 C) 98.6 F (37 C) 98.2 F (36.8 C)  TempSrc: Oral Oral Oral Oral  SpO2: 94% 97% 95% 96%  Weight:      Height:        General: Pt is alert, awake, not in acute distress Cardiovascular:  RRR, S1/S2 +, no rubs, no gallops Respiratory: CTA bilaterally, no wheezing, no rhonchi Abdominal: Soft, NT, ND, bowel sounds + Extremities: no edema, no cyanosis Neuro: much calmer than previous exam. Remains confused. Nonfocal exam. Speech clear and fluent.     The results of significant diagnostics from this hospitalization (including imaging, microbiology, ancillary and laboratory) are listed below for reference.     Microbiology: No results found for this or any previous visit (from the past 240 hour(s)).   Labs: BNP (last 3 results) No results for input(s): BNP in the last 8760 hours. Basic Metabolic Panel:  Recent Labs Lab 02/21/17 1813 02/21/17  1817 02/22/17 0742 02/23/17 0415  NA 140 138 137 137  K 3.7 3.6 3.3* 3.7  CL 98* 97* 100* 100*  CO2 28  --  26 26  GLUCOSE 123* 122* 87 93  BUN 16 16 13 10   CREATININE 0.53 0.50 0.66 0.60  CALCIUM 9.6  --  9.1 8.9  MG  --   --   --  1.9   Liver Function Tests:  Recent Labs Lab 02/21/17 1813 02/22/17 0742  AST 33 28  ALT 18 17  ALKPHOS 93 82  BILITOT 1.1 1.3*  PROT 7.4 6.4*  ALBUMIN 4.1 3.5   No results for input(s): LIPASE, AMYLASE in the last 168 hours. No results for input(s): AMMONIA in the last 168 hours. CBC:  Recent Labs Lab 02/21/17 1813 02/21/17 1817 02/22/17 0742  WBC 14.3*  --  10.5  NEUTROABS 12.8*  --   --   HGB 14.2 14.3 12.7  HCT 42.3 42.0 39.1  MCV 90.2  --  91.1  PLT 210  --  226   Cardiac Enzymes:  Recent Labs Lab 02/22/17 1410  CKTOTAL 278*   BNP: Invalid input(s): POCBNP CBG:  Recent Labs Lab 02/21/17 1857  GLUCAP 99   D-Dimer No results for input(s): DDIMER in the last 72 hours. Hgb A1c No results for input(s): HGBA1C in the last 72 hours. Lipid Profile No results for input(s): CHOL, HDL, LDLCALC, TRIG, CHOLHDL, LDLDIRECT in the last 72 hours. Thyroid function studies No results for input(s): TSH, T4TOTAL, T3FREE, THYROIDAB in the last 72 hours.  Invalid  input(s): FREET3 Anemia work up No results for input(s): VITAMINB12, FOLATE, FERRITIN, TIBC, IRON, RETICCTPCT in the last 72 hours. Urinalysis    Component Value Date/Time   COLORURINE AMBER (A) 10/04/2016 1825   APPEARANCEUR HAZY (A) 10/04/2016 1825   LABSPEC 1.014 10/04/2016 1825   PHURINE 5.0 10/04/2016 1825   GLUCOSEU NEGATIVE 10/04/2016 1825   HGBUR NEGATIVE 10/04/2016 1825   BILIRUBINUR NEGATIVE 10/04/2016 1825   BILIRUBINUR 2+ 11/09/2014 1429   KETONESUR 5 (A) 10/04/2016 1825   PROTEINUR 100 (A) 10/04/2016 1825   UROBILINOGEN negative 11/09/2014 1429   UROBILINOGEN 0.2 01/12/2013 1618   NITRITE NEGATIVE 10/04/2016 1825   LEUKOCYTESUR TRACE (A) 10/04/2016 1825   Sepsis Labs Invalid input(s): PROCALCITONIN,  WBC,  LACTICIDVEN Microbiology No results found for this or any previous visit (from the past 240 hour(s)).   Time coordinating discharge: 40 minutes  SIGNED:  Dessa Phi, DO [facility] Pager [phone]  If 7PM-7AM, please contact night-coverage [email or web address] Password [email or web address] 02/25/2017, 11:13 AM

## 2017-02-25 NOTE — Clinical Social Work Placement (Signed)
   CLINICAL SOCIAL WORK PLACEMENT  NOTE  Date:  02/25/2017  Patient Details  Name: Marissa Bowen MRN: [id number] Date of Birth: 04/06/1927  Clinical Social Work is seeking post-discharge placement for this patient at the [place] level of care (*CSW will initial, date and re-position this form in  chart as items are completed):  Yes   Patient/family provided with [place] Work Department's list of facilities offering this level of care within the geographic area requested by the patient (or if unable, by the patient's family).  Yes   Patient/family informed of their freedom to choose among providers that offer the needed level of care, that participate in Medicare, Medicaid or managed care program needed by the patient, have an available bed and are willing to accept the patient.  Yes   Patient/family informed of [facility]'s ownership interest in [facility] and [facility], as well as of the fact that they are under no obligation to receive care at these facilities.  PASRR submitted to EDS on 02/24/17     PASRR number received on 02/24/17     Existing PASRR number confirmed on       FL2 transmitted to all facilities in geographic area requested by pt/family on 02/24/17     FL2 transmitted to all facilities within larger geographic area on       Patient informed that his/her managed care company has contracts with or will negotiate with certain facilities, including the following:        Yes   Patient/family informed of bed offers received.  Patient chooses bed at [facility] and patient chooses bed at      Patient to be transferred to [facility] on 02/25/17.  Patient to be transferred to facility by [facility]     Patient family notified on 02/25/17 of transfer.  Name of family member notified:  Celeste     PHYSICIAN       Additional Comment:     _______________________________________________ Benard Halsted, [place] 02/25/2017, 12:13 PM

## 2017-02-26 ENCOUNTER — Non-Acute Institutional Stay (SKILLED_NURSING_FACILITY): Payer: Medicare Other | Admitting: Adult Health

## 2017-02-26 ENCOUNTER — Encounter: Payer: Self-pay | Admitting: Adult Health

## 2017-02-26 DIAGNOSIS — E785 Hyperlipidemia, unspecified: Secondary | ICD-10-CM

## 2017-02-26 DIAGNOSIS — F0392 Unspecified dementia, unspecified severity, with psychotic disturbance: Secondary | ICD-10-CM

## 2017-02-26 DIAGNOSIS — K219 Gastro-esophageal reflux disease without esophagitis: Secondary | ICD-10-CM

## 2017-02-26 DIAGNOSIS — R531 Weakness: Secondary | ICD-10-CM

## 2017-02-26 DIAGNOSIS — I480 Paroxysmal atrial fibrillation: Secondary | ICD-10-CM | POA: Diagnosis not present

## 2017-02-26 DIAGNOSIS — F418 Other specified anxiety disorders: Secondary | ICD-10-CM

## 2017-02-26 DIAGNOSIS — F0391 Unspecified dementia with behavioral disturbance: Secondary | ICD-10-CM | POA: Diagnosis not present

## 2017-02-26 DIAGNOSIS — I10 Essential (primary) hypertension: Secondary | ICD-10-CM | POA: Diagnosis not present

## 2017-02-26 NOTE — Progress Notes (Signed)
DATE:  02/26/2017   MRN:  [id number]  BIRTHDAY: 10/11/1926  Facility:  Nursing Home Location:  [facility] Room Number: [id number]  LEVEL OF CARE:  SNF (31)  Contact Information    Name Relation Home Work Mobile   Webb,Celeste Daughter   [phone]   Masaye, Gatchalian   [phone]       Code Status History    Date Active Date Inactive Code Status Order ID Comments User Context   02/21/2017 10:15 PM 02/25/2017  5:23 PM Full Code [id number]  Jani Gravel, MD Inpatient   10/05/2016 12:29 AM 10/07/2016  2:30 PM Full Code [id number]  Toy Baker, MD Inpatient   01/03/2015  1:04 PM 01/05/2015  1:06 PM Full Code [id number]  Donnie Mesa, MD Inpatient   06/08/2012  4:07 PM 06/09/2012  2:23 PM Full Code [id number]  Charlynne Cousins, MD Inpatient    Advance Directive Documentation     Most Recent Value  Type of Advance Directive  Out of facility DNR (pink MOST or yellow form)  Pre-existing out of facility DNR order (yellow form or pink MOST form)  -  "MOST" Form in Place?  -       Chief Complaint  Patient presents with  . Acute Visit    Hospital followup    HISTORY OF PRESENT ILLNESS:  This is an 77-YO female admitted to [facility] for short term rehabilitation on 02/25/2017 following an admission [facility]26-10/30[facility]18 for TIA versus stroke, after the patient was found on the floor and was very weak.  She has a PMH of CAD, GERD, A. Fib, depression with anxiety, essential, history of CVA with residual right leg weakness,  and TIA. MRI did not reveal new stroke. She was evaluated by neurology. Her hospitalization was complicated by dementia with behavioral disturbance. It was reported by her daughter that she lived alone and police was called since she could not get into the house. She was seen in the room today.     PAST MEDICAL HISTORY:  Past Medical History:  Diagnosis Date  . Anemia   . Anxiety   . BACK PAIN, LUMBAR  09/27/2009  . CAD (coronary artery disease)    a. s/p remote CABG 1997, 3V.;  b. LexiScan Myoview (8/15): No ischemia, EF 72%, normal study;  c. Echo 12/13 - EF 60-65%, no significant valvular abnormalities, normal RV size and systolic function.   . CVA (cerebral infarction)    a. Thalamic infarct 09/2009. //  b. hx of TIA in 2008  . Gastric ulcer   . GERD 11/25/2006  . GI bleed    a. 09/2009: secondary to antral ulcer.  . Hiatal hernia   . History of diverticulitis Dx 02/2012  . History of MI (myocardial infarction)   . HYPERLIPIDEMIA   . HYPERTENSION   . IBS 12/07/2008  . MIGRAINE WITH AURA 08/13/2007  . Mild carotid artery disease ([place])    a. 0-39% bilat 05/2011 (f/u recommended 05/2013). //  b. Carotid US 3/17 - Bilat ICA 1-39% >> FU prn  . Muscle weakness (generalized) 09/27/2009  . OSTEOARTHRITIS 08/13/2007  . PAF (paroxysmal atrial fibrillation) ([place]) 11/25/2006   a. Multaq >> changes to Amiodarone 2/2 $$  //  Eliquis (CHADS2-VASc = 6)  . Urinary, incontinence, stress female    wears Pessary     CURRENT MEDICATIONS: Reviewed  Patient's Medications  New Prescriptions   No medications on file  Previous Medications  ACETAMINOPHEN (TYLENOL) 325 MG TABLET    Take 650 mg by mouth every 6 (six) hours as needed.   AMIODARONE (PACERONE) 200 MG TABLET    TAKE ONE-HALF TABLET DAILY   DULOXETINE (CYMBALTA) 60 MG CAPSULE    Take 1 capsule (60 mg total) by mouth daily. For mood and chronic pain   ELIQUIS 5 MG TABS TABLET    Take 1 tablet (5 mg total) by mouth 2 (two) times daily.   EZETIMIBE (ZETIA) 10 MG TABLET    Take 1 tablet (10 mg total) by mouth daily.   LORAZEPAM (ATIVAN) 1 MG TABLET    Take 1 tablet (1 mg total) by mouth every 8 (eight) hours as needed for anxiety.   LOSARTAN (COZAAR) 50 MG TABLET    TAKE ONE TABLET TWICE DAILY   MECLIZINE (ANTIVERT) 12.5 MG TABLET    Take 12.5 mg by mouth 3 (three) times daily as needed for dizziness.   MULTIPLE VITAMIN (MULTIVITAMIN WITH MINERALS)  TABS TABLET    Take 1 tablet by mouth daily.   MULTIPLE VITAMINS-MINERALS (PRESERVISION AREDS) TABS    Take 1 tablet by mouth daily.    NIACIN 500 MG CR CAPSULE    Take 500 mg by mouth 2 (two) times daily.    PANTOPRAZOLE (PROTONIX) 40 MG TABLET    TAKE ONE TABLET TWICE DAILY   QUETIAPINE (SEROQUEL) 25 MG TABLET    Take 1 tablet (25 mg total) by mouth at bedtime.   SODIUM CHLORIDE (OCEAN) 0.65 % SOLN NASAL SPRAY    Place 1 spray into both nostrils at bedtime.    TETRAHYDROZOLINE HCL (VISINE OP)    Place 2 drops into both eyes at bedtime.   Modified Medications   No medications on file  Discontinued Medications   ACETAMINOPHEN (TYLENOL) 500 MG TABLET    Take 500-1,000 mg by mouth every 6 (six) hours as needed for moderate pain.    MECLIZINE (ANTIVERT) 12.5 MG TABLET    TAKE ONE OR TWO TABLETS EVERY EIGHT HOURS AS NEEDED FOR DIZZINESS     Allergies  Allergen Reactions  . Asa [Aspirin] Other (See Comments)    Bleeding ulcer  . Crestor [Rosuvastatin Calcium] Other (See Comments)    myalgia  . Hydrocodone Other (See Comments)    REACTION: migraines  . Ibuprofen Other (See Comments)    ulcers  . Oxycodone Hcl Other (See Comments)    REACTION: migraines  . Penicillins Swelling and Rash    Has patient had a PCN reaction causing immediate rash, facial/tongue/throat swelling, SOB or lightheadedness with hypotension: Yes Has patient had a PCN reaction causing severe rash involving mucus membranes or skin necrosis: No Has patient had a PCN reaction that required hospitalization No Has patient had a PCN reaction occurring within the last 10 years: No If all of the above answers are "NO", then may proceed with Cephalosporin use.   . Prednisone Other (See Comments)    ulcers  . Statins Other (See Comments)    Aches and pains  . Morphine And Related Other (See Comments)     Had hallucinations with morphine sulfate in the past    . Pristiq [Desvenlafaxine] Other (See Comments)    Drowsiness  and hangover sensation     REVIEW OF SYSTEMS:  GENERAL: no change in appetite, no fatigue, no weight changes, no fever, chills or weakness MOUTH and THROAT: Denies oral discomfort RESPIRATORY: no cough, SOB, DOE, wheezing, hemoptysis CARDIAC: no chest pain, edema or palpitations GI:  no abdominal pain, diarrhea, constipation, heart burn, nausea or vomiting GU: Denies dysuria, frequency, hematuria, incontinence, or discharge PSYCHIATRIC: Denies feeling of depression or anxiety. No report of hallucinations, insomnia, paranoia, or agitation    PHYSICAL EXAMINATION  GENERAL APPEARANCE: Well nourished. In no acute distress. Normal body habitus SKIN:  Skin is warm and dry.  MOUTH and THROAT: Lips are without lesions. Oral mucosa is moist and without lesions.  RESPIRATORY: breathing is even & unlabored, BS CTAB CARDIAC: RRR, no murmur,no extra heart sounds, no edema GI: abdomen soft, normal BS, no masses, no tenderness, no hepatomegaly, no splenomegaly EXTREMITIES: Able to move X 4 extremities NEURO:  +tremors PSYCHIATRIC: Alert to self, place and  month, disoriented to year. Affect and behavior are appropriate    LABS/RADIOLOGY: Labs reviewed: Basic Metabolic Panel:  Recent Labs  05/04/16 1258  10/05/16 0226  02/21/17 1813 02/21/17 1817 02/22/17 0742 02/23/17 0415  NA 138  < > 133*  < > 140 138 137 137  K 3.9  < > 3.7  < > 3.7 3.6 3.3* 3.7  CL 101  < > 98*  < > 98* 97* 100* 100*  CO2 28  < > 26  < > 28  --  26 26  GLUCOSE 151*  < > 163*  < > 123* 122* 87 93  BUN 11  < > 11  < > 16 16 13 10   CREATININE 0.69  < > 0.67  < > 0.53 0.50 0.66 0.60  CALCIUM 9.4  < > 9.2  < > 9.6  --  9.1 8.9  MG 1.9  --  2.0  --   --   --   --  1.9  PHOS  --   --  3.3  --   --   --   --   --   < > = values in this interval not displayed. Liver Function Tests:  Recent Labs  10/16/16 1203 02/21/17 1813 02/22/17 0742  AST 16 33 28  ALT 25 18 17   ALKPHOS 91 93 82  BILITOT 0.4 1.1 1.3*    PROT 6.7 7.4 6.4*  ALBUMIN 4.1 4.1 3.5    Recent Labs  03/07/16 1447 10/05/16 0226  LIPASE 34 28  AMYLASE 57  --    CBC:  Recent Labs  10/04/16 1707 10/05/16 0226 02/21/17 1813 02/21/17 1817 02/22/17 0742  WBC 18.1* 14.1* 14.3*  --  10.5  NEUTROABS 16.3* 12.3* 12.8*  --   --   HGB 13.1 12.4 14.2 14.3 12.7  HCT 39.8 37.6 42.3 42.0 39.1  MCV 91.7 91.0 90.2  --  91.1  PLT 198 213 210  --  226   Lipid Panel:  Recent Labs  02/22/17 0742  HDL 65   Cardiac Enzymes:  Recent Labs  10/04/16 2233 10/04/16 2315 10/05/16 0958 02/22/17 1410  CKTOTAL  --   --   --  278*  TROPONINI <0.03 <0.03 <0.03  --    CBG:  Recent Labs  02/21/17 1857  GLUCAP 99      Ct Cervical Spine Wo Contrast  Result Date: 02/21/2017 CLINICAL DATA:  Fall.  Right-sided weakness. Initial encounter. EXAM: CT CERVICAL SPINE WITHOUT CONTRAST TECHNIQUE: Multidetector CT imaging of the cervical spine was performed without intravenous contrast. Multiplanar CT image reconstructions were also generated. COMPARISON:  None. FINDINGS: Alignment: No traumatic malalignment. Skull base and vertebrae: Negative for fracture. Soft tissues and spinal canal: No prevertebral fluid or swelling. No visible canal hematoma. Disc  levels: Diffuse disc degeneration and facet spurring. No evidence cord impingement. Upper chest: No acute finding IMPRESSION: No evidence of cervical spine injury. Electronically Signed   By: Monte Fantasia M.D.   On: 02/21/2017 18:43   Mr Brain Wo Contrast  Result Date: 02/22/2017 CLINICAL DATA:  Found down at home. Leg weakness, speech difficulties. History of atrial fibrillation, hypertension, hyperlipidemia, carotid artery disease. EXAM: MRI HEAD WITHOUT CONTRAST MRA HEAD WITHOUT CONTRAST TECHNIQUE: Multiplanar, multiecho pulse sequences of the brain and surrounding structures were obtained without intravenous contrast. Angiographic images of the head were obtained using MRA technique  without contrast. COMPARISON:  CT HEAD February 21, 2017 at 1822 hours and MRI/MRA head September 28, 2009 FINDINGS: MRI HEAD FINDINGS BRAIN: No reduced diffusion to suggest acute ischemia. LEFT posterior temporal chronic microhemorrhage. Confluent supratentorial and patchy pontine white matter FLAIR T2 hyperintensities worse than prior MRI. Faint T2 hyperintensities bilateral basal ganglia and bowel eye associated with chronic small vessel ischemic disease. Old RIGHT thalamus lacunar infarcts. VASCULAR: Normal major intracranial vascular flow voids present at skull base. SKULL AND UPPER CERVICAL SPINE: No abnormal sellar expansion. No suspicious calvarial bone marrow signal. Craniocervical junction maintained. SINUSES/ORBITS: The mastoid air-cells and included paranasal sinuses are well-aerated. The included ocular globes and orbital contents are non-suspicious. Status post bilateral ocular lens implants. OTHER: None. MRA HEAD FINDINGS ANTERIOR CIRCULATION: Normal flow related enhancement of the included cervical, petrous, cavernous and supraclinoid internal carotid arteries. Patent anterior communicating artery. Patent anterior and middle cerebral arteries, mild luminal irregularity compatible with atherosclerosis. Severe stenosis LEFT A2 segment, moderate stenosis RIGHT A2 segment. No large vessel occlusion,   aneurysm. POSTERIOR CIRCULATION: Codominant vertebral artery's. Similarly tortuous mid basilar artery. Scratch Basilar artery is patent, with normal flow related enhancement of the main branch vessels. Patent bilateral vertebral artery's. Fetal origin on the LEFT, robust RIGHT posterior communicating artery with tiny P1 segment. No large vessel occlusion, flow limiting stenosis,  aneurysm. ANATOMIC VARIANTS: None. Source images and MIP images were reviewed. IMPRESSION: MRI HEAD: 1. No acute intracranial process. 2. Progressed severe chronic small vessel ischemic disease. Old RIGHT thalamus lacunar infarcts. MRA  HEAD: 1. No emergent large vessel occlusion. 2. Intracranial atherosclerosis, severe stenosis LEFT A2 segment. Electronically Signed   By: Elon Alas M.D.   On: 02/22/2017 00:40   Mr Virgel Paling EU Contrast  Result Date: 02/22/2017 CLINICAL DATA:  Found down at home. Leg weakness, speech difficulties. History of atrial fibrillation, hypertension, hyperlipidemia, carotid artery disease. EXAM: MRI HEAD WITHOUT CONTRAST MRA HEAD WITHOUT CONTRAST TECHNIQUE: Multiplanar, multiecho pulse sequences of the brain and surrounding structures were obtained without intravenous contrast. Angiographic images of the head were obtained using MRA technique without contrast. COMPARISON:  CT HEAD February 21, 2017 at 1822 hours and MRI/MRA head September 28, 2009 FINDINGS: MRI HEAD FINDINGS BRAIN: No reduced diffusion to suggest acute ischemia. LEFT posterior temporal chronic microhemorrhage. Confluent supratentorial and patchy pontine white matter FLAIR T2 hyperintensities worse than prior MRI. Faint T2 hyperintensities bilateral basal ganglia and bowel eye associated with chronic small vessel ischemic disease. Old RIGHT thalamus lacunar infarcts. VASCULAR: Normal major intracranial vascular flow voids present at skull base. SKULL AND UPPER CERVICAL SPINE: No abnormal sellar expansion. No suspicious calvarial bone marrow signal. Craniocervical junction maintained. SINUSES/ORBITS: The mastoid air-cells and included paranasal sinuses are well-aerated. The included ocular globes and orbital contents are non-suspicious. Status post bilateral ocular lens implants. OTHER: None. MRA HEAD FINDINGS ANTERIOR CIRCULATION: Normal flow related enhancement of the included  cervical, petrous, cavernous and supraclinoid internal carotid arteries. Patent anterior communicating artery. Patent anterior and middle cerebral arteries, mild luminal irregularity compatible with atherosclerosis. Severe stenosis LEFT A2 segment, moderate stenosis RIGHT A2  segment. No large vessel occlusion,   aneurysm. POSTERIOR CIRCULATION: Codominant vertebral artery's. Similarly tortuous mid basilar artery. Scratch Basilar artery is patent, with normal flow related enhancement of the main branch vessels. Patent bilateral vertebral artery's. Fetal origin on the LEFT, robust RIGHT posterior communicating artery with tiny P1 segment. No large vessel occlusion, flow limiting stenosis,  aneurysm. ANATOMIC VARIANTS: None. Source images and MIP images were reviewed. IMPRESSION: MRI HEAD: 1. No acute intracranial process. 2. Progressed severe chronic small vessel ischemic disease. Old RIGHT thalamus lacunar infarcts. MRA HEAD: 1. No emergent large vessel occlusion. 2. Intracranial atherosclerosis, severe stenosis LEFT A2 segment. Electronically Signed   By: Elon Alas M.D.   On: 02/22/2017 00:40   Ct Head Code Stroke Wo Contrast  Result Date: 02/21/2017 CLINICAL DATA:  Code stroke. Aphasia. Right-sided weakness. Fall on Eliquis. EXAM: CT HEAD WITHOUT CONTRAST TECHNIQUE: Contiguous axial images were obtained from the base of the skull through the vertex without intravenous contrast. COMPARISON:  01/15/2017 FINDINGS: Brain: No evidence of acute infarction, hemorrhage, hydrocephalus, extra-axial collection or mass lesion/mass effect. Advanced low-density throughout the cerebral white matter. Stable appearance of patchy low-density in the bilateral deep gray nuclei from small vessel ischemia and lacunar infarcts. Suspect chronic pontine small-vessel disease as well. Age normal brain volume. Vascular: Atherosclerotic calcification.  No hyperdense vessel. Skull: No acute finding Sinuses/Orbits: Bilateral cataract resection. ASPECTS [facility] Stroke Program Early CT Score) Not scored given the extent of chronic subcortical injury. These results were called by telephone at the time of interpretation on 02/21/2017 at 6:40 pm to Dr. Milton Ferguson , who verbally acknowledged these  results. IMPRESSION: 1. No hemorrhage or acute cortical infarct. 2. Advanced chronic small vessel ischemia in the cerebral white matter and deep gray nuclei. A superimposed infarct would easily be obscured. Electronically Signed   By: Monte Fantasia M.D.   On: 02/21/2017 18:40    ASSESSMENT/PLAN:  1. Weakness - MRI/MRA is negative for acute stroke, for rehabilitation, PT and OT, for therapeutic strengthening exercises, fall precautions   2. Paroxysmal atrial fibrillation (HCC) - rate controlled, amiodarone 200 mg give 1/2 tab = 100 mg daily and Eliquis 5 mg 1 tab twice a day   3. Essential hypertension - stable, continue losartan 50 mg 1 tab twice a day   4. Gastroesophageal reflux disease without esophagitis - stable, continue pantoprazole 40 mg 1 tab twice a day   5. Depression with anxiety - mood is stable, continue duloxetine 60 mg 1 capsule daily and decrease Ativan from 1 mg TID PRN to 0.5 mg1 tab PO BID PRN X 2 weeks   6. Hyperlipidemia LDL goal <100 - continue Ezetimibe 10 mg 1 daily and niacin ER 500 mg  twice a day Lab Results  Component Value Date   CHOL 191 02/22/2017   HDL 65 02/22/2017   LDLCALC 110 (H) 02/22/2017   TRIG 82 02/22/2017   CHOLHDL 2.9 02/22/2017     7. Dementia with psychosis - continue Seroquel 25 mg 1 tab daily at bedtime, supportive care and fall precautions     Goals of care:  Short-term rehabilitation    Loisann Roach C. [place] - NP    [employer] [phone]

## 2017-02-27 ENCOUNTER — Emergency Department (HOSPITAL_COMMUNITY)
Admission: EM | Admit: 2017-02-27 | Discharge: 2017-02-27 | Disposition: A | Discharge status: Home / Self Care | Payer: Medicare Other | Attending: Emergency Medicine | Admitting: Emergency Medicine

## 2017-02-27 ENCOUNTER — Encounter: Payer: Self-pay | Admitting: Internal Medicine

## 2017-02-27 ENCOUNTER — Non-Acute Institutional Stay (SKILLED_NURSING_FACILITY): Payer: Medicare Other | Admitting: Internal Medicine

## 2017-02-27 ENCOUNTER — Encounter (HOSPITAL_COMMUNITY): Payer: Self-pay

## 2017-02-27 DIAGNOSIS — Z7901 Long term (current) use of anticoagulants: Secondary | ICD-10-CM | POA: Insufficient documentation

## 2017-02-27 DIAGNOSIS — Z8673 Personal history of transient ischemic attack (TIA), and cerebral infarction without residual deficits: Secondary | ICD-10-CM | POA: Insufficient documentation

## 2017-02-27 DIAGNOSIS — I2510 Atherosclerotic heart disease of native coronary artery without angina pectoris: Secondary | ICD-10-CM | POA: Diagnosis not present

## 2017-02-27 DIAGNOSIS — R040 Epistaxis: Secondary | ICD-10-CM | POA: Insufficient documentation

## 2017-02-27 DIAGNOSIS — I10 Essential (primary) hypertension: Secondary | ICD-10-CM | POA: Insufficient documentation

## 2017-02-27 DIAGNOSIS — I480 Paroxysmal atrial fibrillation: Secondary | ICD-10-CM | POA: Diagnosis not present

## 2017-02-27 DIAGNOSIS — R296 Repeated falls: Secondary | ICD-10-CM | POA: Insufficient documentation

## 2017-02-27 DIAGNOSIS — R9431 Abnormal electrocardiogram [ECG] [EKG]: Secondary | ICD-10-CM | POA: Diagnosis not present

## 2017-02-27 DIAGNOSIS — Z79899 Other long term (current) drug therapy: Secondary | ICD-10-CM | POA: Diagnosis not present

## 2017-02-27 DIAGNOSIS — Z955 Presence of coronary angioplasty implant and graft: Secondary | ICD-10-CM | POA: Insufficient documentation

## 2017-02-27 DIAGNOSIS — Z951 Presence of aortocoronary bypass graft: Secondary | ICD-10-CM | POA: Insufficient documentation

## 2017-02-27 DIAGNOSIS — H5702 Anisocoria: Secondary | ICD-10-CM

## 2017-02-27 HISTORY — DX: Anisocoria: H57.02

## 2017-02-27 MED ORDER — OXYMETAZOLINE HCL 0.05 % NA SOLN
1.0000 | Freq: Once | NASAL | Status: AC
  Administered 2017-02-27: 1 via NASAL
  Filled 2017-02-27: qty 15

## 2017-02-27 NOTE — Patient Instructions (Signed)
See assessment and plan under each diagnosis in the problem list and acutely for this visit 

## 2017-02-27 NOTE — Progress Notes (Signed)
NURSING HOME LOCATION:  [facility] ROOM NUMBER:  [id number]  CODE STATUS:  DNR  PCP:  Gildardo Cranker, DO  [place] [id number]   This is a comprehensive admission note to [facility] performed on this date less than 30 days from date of admission. Included are preadmission medical/surgical history;reconciled medication list; family history; social history and comprehensive review of systems.  Corrections and additions to the records were documented . Comprehensive physical exam was also performed. Additionally a clinical summary was entered for each active diagnosis pertinent to this admission in the Problem List to enhance continuity of care.  HPI: The patient was hospitalized 10/26-10/30/18, found at home after being down for 5-6 hours by her daughter's estimate. House was locked & police entry was required. In the ED MRI did not reveal any stroke. It showed progressive severe chronic small vessel ischemic disease and old right thalamic and lacunar infarcts. Severe stenosis of the left A2 segment was present . ECHO revealed no source of emboli. Carotid Doppler revealed no significant stenosis and no significant change since 2017. Due to statin intolerance,only Zetia was continued.  Hospitalization was complicated by dementia with behavior disturbance. SNF was recommended as the patient lived by herself and has been exhibiting progressive cognitive deficits over time. MMSE score was 21 out of 30 indicating mild cognitive impairment. Seroquel was prescribed at bedtime. Amiodarone and Eliquis were continued for PAF. Palliative care consultation was recommended.  Past medical and surgical history: Includes PAF, coronary artery disease, GERD, depression with anxiety, essential hypertension, and history of TIA. Surgeries have included laparoscopic cholecystectomy, bypass grafting, coronary angioplasty,  Social history: Nonsmoker, nondrinker Family  history:reviewed  Review of systems: She now states that the fall that prompted hospitalization was actually a fall out of bed. She denies any cardiac or neurologic prodrome. She states she's fallen 6-8 times over the last indefinite time. Mainly this is mechanical in nature, tripping over vegetation chasing her cats. She has unrelated intermittent vertigo not associated with falls. She states that she will  "grab the washer" with such episodes. She has had a "bump" in the left nostril for several months. She believes this was the cause of the bleeding today prompting the ED visit. She denies any other bleeding dyscrasias. She has had lifelong issues with her eyes mainly as flashing lights. Her ophthalmologist also wanted to do laser surgery; she is unsure the reason. She has not pursued this because of logistics and care provider issues.  She describes fatigue after PT/OT.  Constitutional: No fever,significant weight change  Eyes: No redness, discharge, pain,new  vision change ENT/mouth: No nasal congestion,  purulent discharge, earache,change in hearing ,sore throat  Cardiovascular: No chest pain, palpitations,paroxysmal nocturnal dyspnea, claudication, edema  Respiratory: No cough, sputum production,hemoptysis, DOE , significant snoring,apnea  Gastrointestinal: No heartburn,dysphagia,abdominal pain, nausea / vomiting,rectal bleeding, melena,change in bowels Genitourinary: No dysuria,hematuria, pyuria,  incontinence, nocturia Musculoskeletal: No joint stiffness, joint swelling, weakness,pain Dermatologic: No rash, pruritus, change in appearance of skin Neurologic: No headache,syncope, seizures, numbness , tingling Psychiatric: No significant anxiety , depression, insomnia, anorexia Endocrine: No change in hair/skin/ nails, excessive thirst, excessive hunger, excessive urination  Hematologic/lymphatic: No significant bruising, lymphadenopathy Allergy/immunology: No itchy/ watery eyes, significant  sneezing, urticaria, angioedema  Physical exam:  Pertinent or positive findings: She has a tremor of her jaw and coarse intention tremor of both extremities, worse on the left. Prominent stare is present. The right pupil is larger than the left. There  is dried blood in the left nostril. I cannot visualize a lesion. Pedal pulses are decreased. She has symmetric weakness in all extremities. She has bruising over the dorsum of the hands.  General appearance:Adequately nourished; no acute distress , increased work of breathing is present.   Lymphatic: No lymphadenopathy about the head, neck, axilla . Eyes: No conjunctival inflammation or lid edema is present. There is no scleral icterus. Ears:  External ear exam shows no significant lesions or deformities.   Nose:  External nasal examination shows no deformity or inflammation.  Oral exam: lips and gums are healthy appearing.There is no oropharyngeal erythema or exudate . Neck:  No thyromegaly, masses, tenderness noted.    Heart:  Normal rate and regular rhythm. S1 and S2 normal without gallop, murmur, click, rub .  Lungs:Chest clear to auscultation without wheezes, rhonchi,rales , rubs. Abdomen:Bowel sounds are normal. Abdomen is soft and nontender with no organomegaly, hernias,masses. GU: deferred  Extremities:  No cyanosis, clubbing,edema  Neurologic exam : Balance,Rhomberg,finger to nose testing could not be completed due to clinical state Deep tendon reflexes are equal Skin: Warm & dry w/o tenting. No significant lesions or rash.  See clinical summary under each active problem in the Problem List with associated updated therapeutic plan

## 2017-02-27 NOTE — ED Provider Notes (Signed)
[facility] EMERGENCY DEPARTMENT Provider Note   CSN: [id number] Arrival date & time: 02/27/17  0907     History   Chief Complaint Chief Complaint  Patient presents with  . Epistaxis    HPI Marissa Bowen is a 83 y.o. female.  Patient presents with nosebleed to the left nostril.   The history is provided by the patient. No language interpreter was used.  Epistaxis   This is a new problem. The current episode started 3 to 5 hours ago. The problem occurs constantly. The problem has been resolved. The problem is associated with anticoagulants. The bleeding has been from the left nare. She has tried nothing for the symptoms. The treatment provided no relief. Her past medical history does not include sinus problems.    Past Medical History:  Diagnosis Date  . Anemia   . Anxiety   . BACK PAIN, LUMBAR 09/27/2009  . CAD (coronary artery disease)    a. s/p remote CABG 1997, 3V.;  b. LexiScan Myoview (8/15): No ischemia, EF 72%, normal study;  c. Echo 12/13 - EF 60-65%, no significant valvular abnormalities, normal RV size and systolic function.   . CVA (cerebral infarction)    a. Thalamic infarct 09/2009. //  b. hx of TIA in 2008  . Gastric ulcer   . GERD 11/25/2006  . GI bleed    a. 09/2009: secondary to antral ulcer.  . Hiatal hernia   . History of diverticulitis Dx 02/2012  . History of MI (myocardial infarction)   . HYPERLIPIDEMIA   . HYPERTENSION   . IBS 12/07/2008  . MIGRAINE WITH AURA 08/13/2007  . Mild carotid artery disease ([place])    a. 0-39% bilat 05/2011 (f/u recommended 05/2013). //  b. Carotid US 3/17 - Bilat ICA 1-39% >> FU prn  . Muscle weakness (generalized) 09/27/2009  . OSTEOARTHRITIS 08/13/2007  . PAF (paroxysmal atrial fibrillation) ([place]) 11/25/2006   a. Multaq >> changes to Amiodarone 2/2 $$  //  Eliquis (CHADS2-VASc = 6)  . Urinary, incontinence, stress female    wears Pessary    Patient Active Problem List   Diagnosis Date Noted  . TIA  (transient ischemic attack) 02/21/2017  . GAD (generalized anxiety disorder) 11/19/2016  . Diverticulitis 10/05/2016  . Elevated LFTs 10/04/2016  . SIRS (systemic inflammatory response syndrome) ([place]) 10/04/2016  . Macular degeneration of both eyes 09/22/2015  . Carotid stenosis 08/10/2015  . On amiodarone therapy 08/10/2015  . Chronic cholecystitis with calculus 01/03/2015  . Allergic rhinitis 08/05/2014  . Essential hypertension 05/03/2014  . Osteoarthritis 09/29/2013  . Routine general medical examination at a health care facility 09/29/2013  . Depression with anxiety 08/03/2013  . Atrial fibrillation ([place]) 09/02/2012  . Anemia due to chronic blood loss 08/18/2012  . Diarrhea 06/08/2012  . Orthostatic hypotension 06/08/2012  . ACUTE POSTHEMORRHAGIC ANEMIA 10/23/2009  . BACK PAIN, LUMBAR 09/27/2009  . MUSCLE WEAKNESS (GENERALIZED) 09/27/2009  . IBS 12/07/2008  . Weakness 07/21/2008  . MIGRAINE WITH AURA 08/13/2007  . OSTEOARTHRITIS 08/13/2007  . TRANSIENT ISCHEMIC ATTACK 04/13/2007  . Hyperlipidemia LDL goal <100 11/25/2006  . CAD (coronary artery disease) 11/25/2006  . GERD 11/25/2006  . DIVERTICULITIS, HX OF 11/25/2006    Past Surgical History:  Procedure Laterality Date  . ABDOMINAL HYSTERECTOMY  1973  . CATARACT EXTRACTION W/ INTRAOCULAR LENS  IMPLANT, BILATERAL Bilateral 1999  . CHOLECYSTECTOMY N/A 01/03/2015   Procedure: LAPAROSCOPIC CHOLECYSTECTOMY WITH INTRAOPERATIVE CHOLANGIOGRAM;  Surgeon: Donnie Mesa, MD;  Location: [facility];  Service: General;  Laterality: N/A;  . CORONARY ANGIOPLASTY  1997   Dr Lia Foyer  . CORONARY ARTERY BYPASS GRAFT  1997   Dr Melvenia Needles  . LAPAROSCOPIC CHOLECYSTECTOMY  01/03/2015  . TONSILLECTOMY      OB History    No data available       Home Medications    Prior to Admission medications   Medication Sig Start Date End Date Taking? Authorizing Provider  acetaminophen (TYLENOL) 325 MG tablet Take 650 mg by mouth every 6 (six) hours as  needed for moderate pain.    Yes [provider]  amiodarone (PACERONE) 200 MG tablet TAKE ONE-HALF TABLET DAILY Patient taking differently: TAKE 100MG  BY MOUTH DAILY 01/08/17  Yes Eulas Post, Monica, DO  bisacodyl (DULCOLAX) 10 MG suppository Place 10 mg rectally as needed for moderate constipation.   Yes [provider]  DULoxetine (CYMBALTA) 60 MG capsule Take 1 capsule (60 mg total) by mouth daily. For mood and chronic pain 01/29/17  Yes Carter, Monica, DO  ELIQUIS 5 MG TABS tablet Take 1 tablet (5 mg total) by mouth 2 (two) times daily. 07/26/16  Yes Josue Hector, MD  ezetimibe (ZETIA) 10 MG tablet Take 1 tablet (10 mg total) by mouth daily. 02/26/17  Yes Dessa Phi Chahn-Yang, DO  LORazepam (ATIVAN) 1 MG tablet Take 1 tablet (1 mg total) by mouth every 8 (eight) hours as needed for anxiety. 02/25/17  Yes Dessa Phi Chahn-Yang, DO  losartan (COZAAR) 50 MG tablet TAKE ONE TABLET TWICE DAILY 09/11/16  Yes Josue Hector, MD  magnesium hydroxide (MILK OF MAGNESIA) 400 MG/5ML suspension Take 30 mLs by mouth daily as needed for mild constipation.   Yes [provider]  meclizine (ANTIVERT) 12.5 MG tablet Take 12.5 mg by mouth every 8 (eight) hours as needed for dizziness.    Yes [provider]  Multiple Vitamin (MULTIVITAMIN WITH MINERALS) TABS tablet Take 1 tablet by mouth daily.   Yes [provider]  Multiple Vitamins-Minerals (PRESERVISION AREDS) TABS Take 1 tablet by mouth daily.    Yes [provider]  niacin 500 MG CR capsule Take 500 mg by mouth 2 (two) times daily.    Yes [provider]  pantoprazole (PROTONIX) 40 MG tablet TAKE ONE TABLET TWICE DAILY 09/17/16  Yes Eulas Post, Monica, DO  QUEtiapine (SEROQUEL) 25 MG tablet Take 1 tablet (25 mg total) by mouth at bedtime. 02/25/17  Yes Dessa Phi Chahn-Yang, DO  sodium chloride (OCEAN) 0.65 % SOLN nasal spray Place 1 spray into both nostrils at bedtime.    Yes [provider]  Sodium Phosphates (RA SALINE ENEMA) 19-7 GM/118ML ENEM Place 118 mLs rectally daily as needed (FOR CONSTIPATION).   Yes [provider]  Tetrahydrozoline HCl (VISINE OP) Place 2 drops into both eyes at bedtime.    Yes [provider]  tuberculin 5 UNIT/0.1ML injection Inject 5 Units into the skin once.   Yes [provider]    Family History Family History  Problem Relation Age of Onset  . Heart attack Mother   . Heart failure Father   . Throat cancer Sister   . Heart disease Sister   . Coronary artery disease Sister   . Diabetes Sister   . Heart attack Sister   . Colon cancer Neg Hx     Social History Social History  Substance Use Topics  . Smoking status: Never Smoker  . Smokeless tobacco: Never Used  . Alcohol use No  Allergies   Asa [aspirin]; Crestor [rosuvastatin calcium]; Hydrocodone; Ibuprofen; Oxycodone hcl; Penicillins; Prednisone; Statins; Morphine and related; and Pristiq [desvenlafaxine]   Review of Systems Review of Systems  Constitutional: Negative for appetite change and fatigue.  HENT: Positive for nosebleeds. Negative for congestion, ear discharge and sinus pressure.   Eyes: Negative for discharge.  Respiratory: Negative for cough.   Cardiovascular: Negative for chest pain.  Gastrointestinal: Negative for abdominal pain and diarrhea.  Genitourinary: Negative for frequency and hematuria.  Musculoskeletal: Negative for back pain.  Skin: Negative for rash.  Neurological: Negative for seizures and headaches.  Psychiatric/Behavioral: Negative for hallucinations.     Physical Exam Updated Vital Signs BP (!) 168/72   Pulse 63   Temp 98.2 F (36.8 C)   Resp 16   Ht 5\' 7"  (1.702 m)   Wt 67.6 kg (149 lb)   SpO2 98%   BMI 23.34 kg/m   Physical Exam  Constitutional: She is oriented to person, place, and time. She appears well-developed.  HENT:  Head: Normocephalic.  Dry clot in left nostril  Eyes:  Conjunctivae and EOM are normal. No scleral icterus.  Neck: Neck supple. No thyromegaly present.  Cardiovascular: Normal rate and regular rhythm.  Exam reveals no gallop and no friction rub.   No murmur heard. Pulmonary/Chest: No stridor. She has no wheezes. She has no rales. She exhibits no tenderness.  Abdominal: She exhibits no distension. There is no tenderness. There is no rebound.  Musculoskeletal: Normal range of motion. She exhibits no edema.  Lymphadenopathy:    She has no cervical adenopathy.  Neurological: She is oriented to person, place, and time. She exhibits normal muscle tone. Coordination normal.  Skin: No rash noted. No erythema.  Psychiatric: She has a normal mood and affect. Her behavior is normal.     ED Treatments / Results  Labs (all labs ordered are listed, but only abnormal results are displayed) Labs Reviewed - No data to display  EKG  EKG Interpretation None       Radiology No results found.  Procedures Procedures (including critical care time)  Medications Ordered in ED Medications  oxymetazoline (AFRIN) 0.05 % nasal spray 1 spray (1 spray Each Nare Given 02/27/17 1017)     Initial Impression / Assessment and Plan / ED Course  I have reviewed the triage vital signs and the nursing notes.  Pertinent labs & imaging results that were available during my care of the patient were reviewed by me and considered in my medical decision making (see chart for details).     Patient had bleeding from left nostril and a large clot in there.  The clot was removed.  Patient was given Afrin spray on both sides of her nose and her bleeding stopped.  She will follow-up as needed and is given Afrin spray to take home  Final Clinical Impressions(s) / ED Diagnoses   Final diagnoses:  None    New Prescriptions New Prescriptions   No medications on file     Milton Ferguson, MD 02/27/17 1221

## 2017-02-27 NOTE — ED Triage Notes (Signed)
Pt arrives EMS fromn NH where she had nose bleed noted on waking this am. Denies previous nosebleed. Takes Elliquis. Denies injury or pain. Pt holding towel under nose that has large blood stains.

## 2017-02-27 NOTE — ED Notes (Signed)
Patient verbalizes understanding of discharge instructions. Opportunity for questioning and answers were provided. 

## 2017-02-27 NOTE — Assessment & Plan Note (Signed)
PT/OT at SNF to improve strength and balance

## 2017-02-27 NOTE — Discharge Instructions (Signed)
Use the Afrin spray a few nosebleeds again and return here if any problems

## 2017-02-27 NOTE — Assessment & Plan Note (Signed)
Nasal saline as needed and Eucerin twice a day topically to left nasal lesion ENT consult if recurrence

## 2017-02-27 NOTE — Assessment & Plan Note (Signed)
02/27/17 clinically the patient is in a regular rhythm. ED EKG was reviewed and revealed sinus rhythm. She does have prolonged QT

## 2017-02-28 ENCOUNTER — Encounter: Payer: Self-pay | Admitting: Internal Medicine

## 2017-02-28 DIAGNOSIS — R9431 Abnormal electrocardiogram [ECG] [EKG]: Secondary | ICD-10-CM | POA: Insufficient documentation

## 2017-02-28 NOTE — Assessment & Plan Note (Signed)
02/27/17 Dx added for reference to guide medication selection, especially psychotropic agents with mental status issues

## 2017-03-06 ENCOUNTER — Other Ambulatory Visit: Payer: Self-pay | Admitting: *Deleted

## 2017-03-06 NOTE — Patient Outreach (Signed)
[facility]) Care Management  03/06/2017  Marissa Bowen 08/03/1926 [id number]   Met with patient at bedside of facility, she states her family is arranging for her to discharge to [facility].  Met with Tillie Rung, SW at facility, she confirms that patient family is looking at ALF and that Nanine Means came to interview and assess patient today.   Plan to sign off, requested that [place] know if discharge plans change, she agrees.  Royetta Crochet. Laymond Purser, RN, BSN, [place] [phone]) Business Cell  [phone]) Toll Free Office

## 2017-03-11 ENCOUNTER — Encounter: Payer: Self-pay | Admitting: Internal Medicine

## 2017-03-11 ENCOUNTER — Ambulatory Visit: Payer: Medicare Other | Admitting: Internal Medicine

## 2017-03-11 ENCOUNTER — Non-Acute Institutional Stay (SKILLED_NURSING_FACILITY): Payer: Medicare Other | Admitting: Internal Medicine

## 2017-03-11 DIAGNOSIS — R443 Hallucinations, unspecified: Secondary | ICD-10-CM | POA: Diagnosis not present

## 2017-03-11 DIAGNOSIS — I951 Orthostatic hypotension: Secondary | ICD-10-CM

## 2017-03-11 DIAGNOSIS — G250 Essential tremor: Secondary | ICD-10-CM

## 2017-03-11 NOTE — Assessment & Plan Note (Addendum)
Neuropsychiatric evaluation if indicated after Neurology consult

## 2017-03-11 NOTE — Assessment & Plan Note (Signed)
Neurology  consultation. 
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
## 2017-03-11 NOTE — Assessment & Plan Note (Addendum)
Perform isometric exercise of calves  ( while seated go up on toes to count of 5  & then onto heels for 5 count). Repeat  4- 5 times prior to standing if she has been seated or supine for any significant period of time as BP drops with such positions.  Consider TED hose May need to decrease ARB & Niacin if no better

## 2017-03-12 NOTE — Patient Instructions (Signed)
See Current Assessment & Plan in Problem List under each acute Diagnosis

## 2017-03-24 ENCOUNTER — Non-Acute Institutional Stay (SKILLED_NURSING_FACILITY): Payer: Medicare Other | Admitting: Adult Health

## 2017-03-24 ENCOUNTER — Encounter: Payer: Self-pay | Admitting: Adult Health

## 2017-03-24 ENCOUNTER — Ambulatory Visit: Payer: Medicare Other | Admitting: Nurse Practitioner

## 2017-03-24 DIAGNOSIS — G250 Essential tremor: Secondary | ICD-10-CM

## 2017-03-24 DIAGNOSIS — R42 Dizziness and giddiness: Secondary | ICD-10-CM | POA: Diagnosis not present

## 2017-03-24 DIAGNOSIS — F0391 Unspecified dementia with behavioral disturbance: Secondary | ICD-10-CM

## 2017-03-24 DIAGNOSIS — F0392 Unspecified dementia, unspecified severity, with psychotic disturbance: Secondary | ICD-10-CM

## 2017-03-24 DIAGNOSIS — I10 Essential (primary) hypertension: Secondary | ICD-10-CM | POA: Diagnosis not present

## 2017-03-24 DIAGNOSIS — F339 Major depressive disorder, recurrent, unspecified: Secondary | ICD-10-CM | POA: Diagnosis not present

## 2017-03-24 DIAGNOSIS — K219 Gastro-esophageal reflux disease without esophagitis: Secondary | ICD-10-CM

## 2017-03-24 DIAGNOSIS — I480 Paroxysmal atrial fibrillation: Secondary | ICD-10-CM | POA: Diagnosis not present

## 2017-03-24 DIAGNOSIS — R531 Weakness: Secondary | ICD-10-CM | POA: Diagnosis not present

## 2017-03-24 DIAGNOSIS — E785 Hyperlipidemia, unspecified: Secondary | ICD-10-CM | POA: Diagnosis not present

## 2017-03-24 NOTE — Progress Notes (Addendum)
Location:  [facility] Room Number: 309-A Place of Service:  SNF (31) Provider:  Senaida Lange. Medina-Vargas - NP    Patient Care Team: Gildardo Cranker, DO as PCP - General (Internal Medicine) Hillary Bow, MD (Cardiology) Melissa Montane, MD as Consulting Physician (Otolaryngology) Arvella Nigh, MD as Consulting Physician (Obstetrics and Gynecology) Rutherford Guys, MD as Consulting Physician (Ophthalmology)  Extended Emergency Contact Information Primary Emergency Contact: Clayborne Artist [place] of [place] Phone: [phone] Relation: Daughter Secondary Emergency Contact: Ramon Dredge [place] of [place] Mobile Phone: [phone] Relation: Son  Code Status:  Full Code  Goals of care: Advanced Directive information Advanced Directives 02/27/2017  Does Patient Have a Medical Advance Directive? No  Type of Advance Directive -  Does patient want to make changes to medical advance directive? No - Patient declined  Copy of [place] in Chart? -  Would patient like information on creating a medical advance directive? -     Chief Complaint  Patient presents with  . Discharge Note    Is discharging home with daughter on 03/25/17    HPI:  Pt is a 83 y.o. female seen today for medical management of chronic diseases.  She is discharging home with home health PT, OT, and Nursing for medication management on 03/25/17.  She has a PMH of CAD, GERD, atrial fibrillation, depression with anxiety, essential HTN, history of a CVA with residual right leg weakness, and TIA.    She has been admitted to [facility] on 02/25/17 from [facility] with hospitalization dates 02/21/17 thru 02/25/17 for TIA vs stroke. She was found on the floor and was very weak so daughter brought her to the hospital. MRI did not reveal new stroke. She was evaluated by neurology. Her hospitalization was complicated by dementia with behavioral  disturbance. It was reported by her daughter that she lived alone and police was called since she could not get into the house.  She was seen in the room today. She thinks that she will be discharged with her daughter who lives in [place]. She complained of occasional dizziness. She has chronic vertigo and has orders for Meclizine PRN. Stat CBC and BMP done today but nothing significant was noted. She will be prescribed her medications. Social worker has scheduled a PCP appointment in [place] on December 14.    Past Medical History:  Diagnosis Date  . Anemia   . Anisocoria 02/27/2017   Sees Dr Rutherford Guys  . Anxiety   . BACK PAIN, LUMBAR 09/27/2009  . CAD (coronary artery disease)    a. s/p remote CABG 1997, 3V.;  b. LexiScan Myoview (8/15): No ischemia, EF 72%, normal study;  c. Echo 12/13 - EF 60-65%, no significant valvular abnormalities, normal RV size and systolic function.   . CVA (cerebral infarction)    a. Thalamic infarct 09/2009. //  b. hx of TIA in 2008  . Gastric ulcer   . GERD 11/25/2006  . GI bleed    a. 09/2009: secondary to antral ulcer.  . Hiatal hernia   . History of diverticulitis Dx 02/2012  . History of MI (myocardial infarction)   . HYPERLIPIDEMIA   . HYPERTENSION   . IBS 12/07/2008  . MIGRAINE WITH AURA 08/13/2007  . Mild carotid artery disease ([place])    a. 0-39% bilat 05/2011 (f/u recommended 05/2013). //  b. Carotid US 3/17 - Bilat ICA 1-39% >> FU prn  . Muscle weakness (generalized) 09/27/2009  . OSTEOARTHRITIS  08/13/2007  . PAF (paroxysmal atrial fibrillation) ([place]) 11/25/2006   a. Multaq >> changes to Amiodarone 2/2 $$  //  Eliquis (CHADS2-VASc = 6)  . Urinary, incontinence, stress female    wears Pessary   Past Surgical History:  Procedure Laterality Date  . ABDOMINAL HYSTERECTOMY  1973  . CATARACT EXTRACTION W/ INTRAOCULAR LENS  IMPLANT, BILATERAL Bilateral 1999  . CHOLECYSTECTOMY N/A 01/03/2015   Procedure: LAPAROSCOPIC CHOLECYSTECTOMY WITH INTRAOPERATIVE  CHOLANGIOGRAM;  Surgeon: Donnie Mesa, MD;  Location: [facility];  Service: General;  Laterality: N/A;  . CORONARY ANGIOPLASTY  1997   Dr Lia Foyer  . CORONARY ARTERY BYPASS GRAFT  1997   Dr Melvenia Needles  . LAPAROSCOPIC CHOLECYSTECTOMY  01/03/2015  . TONSILLECTOMY      Allergies  Allergen Reactions  . Asa [Aspirin] Other (See Comments)    Bleeding ulcer Per MAR  . Crestor [Rosuvastatin Calcium] Other (See Comments)    Myalgia Per MAR  . Hydrocodone Other (See Comments)    Migraines Per MAR  . Ibuprofen Other (See Comments)    Ulcers Per MAR  . Oxycodone Hcl Other (See Comments)     Migraines Per MAR  . Penicillins Swelling and Rash    Per MAR Has patient had a PCN reaction causing immediate rash, facial/tongue/throat swelling, SOB or lightheadedness with hypotension: Yes Has patient had a PCN reaction causing severe rash involving mucus membranes or skin necrosis: No Has patient had a PCN reaction that required hospitalization No Has patient had a PCN reaction occurring within the last 10 years: No If all of the above answers are "NO", then may proceed with Cephalosporin use.   . Prednisone Other (See Comments)    Ulcers Per MAR  . Statins Other (See Comments)    Aches and pains Per MAR  . Morphine And Related Other (See Comments)     Had hallucinations with morphine sulfate in the past   Per MAR  . Pristiq [Desvenlafaxine] Other (See Comments)    Drowsiness and hangover sensation Per [facility]     Outpatient Encounter Medications as of 03/24/2017  Medication Sig  . acetaminophen (TYLENOL) 325 MG tablet Take 650 mg by mouth every 6 (six) hours as needed for moderate pain.   Marland Kitchen amiodarone (PACERONE) 200 MG tablet TAKE ONE-HALF TABLET DAILY  . bisacodyl (DULCOLAX) 10 MG suppository Place 10 mg rectally as needed for moderate constipation.  . DULoxetine (CYMBALTA) 60 MG capsule Take 1 capsule (60 mg total) by mouth daily. For mood and chronic pain  . ELIQUIS 5 MG TABS tablet Take 1  tablet (5 mg total) by mouth 2 (two) times daily.  Marland Kitchen ezetimibe (ZETIA) 10 MG tablet Take 1 tablet (10 mg total) by mouth daily.  Marland Kitchen losartan (COZAAR) 50 MG tablet TAKE ONE TABLET TWICE DAILY  . magnesium hydroxide (MILK OF MAGNESIA) 400 MG/5ML suspension Take 30 mLs by mouth daily as needed for mild constipation.  . meclizine (ANTIVERT) 12.5 MG tablet Take 12.5 mg by mouth every 8 (eight) hours as needed for dizziness.   . Multiple Vitamin (MULTIVITAMIN WITH MINERALS) TABS tablet Take 1 tablet by mouth daily.  . Multiple Vitamins-Minerals (PRESERVISION AREDS) TABS Take 1 tablet by mouth daily.   . niacin 500 MG CR capsule Take 500 mg by mouth 2 (two) times daily.   . pantoprazole (PROTONIX) 40 MG tablet TAKE ONE TABLET TWICE DAILY  . QUEtiapine (SEROQUEL) 25 MG tablet Take 1 tablet (25 mg total) by mouth at bedtime.  . sodium chloride (OCEAN)  0.65 % SOLN nasal spray Place 1 spray into both nostrils at bedtime.   . Sodium Phosphates (RA SALINE ENEMA) 19-7 GM/118ML ENEM Place 118 mLs rectally daily as needed (FOR CONSTIPATION).  . Tetrahydrozoline HCl (VISINE OP) Place 2 drops into both eyes at bedtime.   . [DISCONTINUED] LORazepam (ATIVAN) 0.5 MG tablet Take 0.5 mg 2 (two) times daily as needed by mouth for anxiety.   No facility-administered encounter medications on file as of 03/24/2017.     Review of Systems  GENERAL: No change in appetite, no fatigue, no weight changes, no fever, chills or weakness MOUTH and THROAT: Denies oral discomfort, gingival pain or bleeding RESPIRATORY: no cough, SOB, DOE, wheezing, hemoptysis CARDIAC: No chest pain, edema or palpitations GI: No abdominal pain, diarrhea, constipation, heart burn, nausea or vomiting GU: Denies dysuria, frequency, hematuria, incontinence, or discharge PSYCHIATRIC: Denies feelings of depression or anxiety. No report of hallucinations, insomnia, paranoia, or agitation   Immunization History  Administered Date(s) Administered  .  Influenza Split 02/05/2011, 02/13/2012, 02/02/2013  . Influenza Whole 02/11/2008  . Influenza,inj,Quad PF,6+ Mos 04/11/2014, 01/04/2015, 12/29/2015, 01/29/2017  . Pneumococcal Conjugate-13 09/29/2013  . Pneumococcal-Unspecified 09/27/2016   Pertinent  Health Maintenance Due  Topic Date Due  . INFLUENZA VACCINE  Completed  . DEXA SCAN  Completed  . PNA vac Low Risk Adult  Completed      Vitals:   03/24/17 1008  BP: 116/68  Pulse: 82  Resp: 20  Temp: 97.9 F (36.6 C)  TempSrc: Oral  SpO2: 96%  Weight: 149 lb 0.5 oz (67.6 kg)  Height: _0  (1.702 m)   Body mass index is 23.34 kg/m.   Physical Exam  GENERAL APPEARANCE: Well nourished. In no acute distress. Normal body habitus SKIN:  Skin is warm and dry.  MOUTH and THROAT: Lips are without lesions. Oral mucosa is moist and without lesions. RESPIRATORY: Breathing is even & unlabored, BS CTAB CARDIAC: RRR, no murmur,no extra heart sounds, no edema GI: Abdomen soft, normal BS, no masses, no tenderness EXTREMITIES:  Able to move X 4 extremities PSYCHIATRIC: Alert and oriented X 3. Affect and behavior are appropriate   Labs reviewed: 03/24/17  glucose 114 calcium 9.1 creatinine 7 BUN 14.2 sodium 137 K 4.4 eGFR 78.51 WBC 10.2 hemoglobin 14.1 hematocrit 46.2 MCV 84 platelets 283  Recent Labs    05/04/16 1258  10/05/16 0226  02/21/17 1813 02/21/17 1817 02/22/17 0742 02/23/17 0415  NA 138   < > 133*   < > 140 138 137 137  K 3.9   < > 3.7   < > 3.7 3.6 3.3* 3.7  CL 101   < > 98*   < > 98* 97* 100* 100*  CO2 28   < > 26   < > 28  --  26 26  GLUCOSE 151*   < > 163*   < > 123* 122* 87 93  BUN 11   < > 11   < > _1 CREATININE 0.69   < > 0.67   < > 0.53 0.50 0.66 0.60  CALCIUM 9.4   < > 9.2   < > 9.6  --  9.1 8.9  MG 1.9  --  2.0  --   --   --   --  1.9  PHOS  --   --  3.3  --   --   --   --   --    < > = values  in this interval not displayed.   Recent Labs    10/16/16 1203 02/21/17 1813 02/22/17 0742  AST  16 33 28  ALT _0 ALKPHOS 91 93 82  BILITOT 0.4 1.1 1.3*  PROT 6.7 7.4 6.4*  ALBUMIN 4.1 4.1 3.5   Recent Labs    10/04/16 1707 10/05/16 0226 02/21/17 1813 02/21/17 1817 02/22/17 0742  WBC 18.1* 14.1* 14.3*  --  10.5  NEUTROABS 16.3* 12.3* 12.8*  --   --   HGB 13.1 12.4 14.2 14.3 12.7  HCT 39.8 37.6 42.3 42.0 39.1  MCV 91.7 91.0 90.2  --  91.1  PLT 198 213 210  --  226   Lab Results  Component Value Date   TSH 0.807 10/05/2016   Lab Results  Component Value Date   HGBA1C 5.2 02/22/2017   Lab Results  Component Value Date   CHOL 191 02/22/2017   HDL 65 02/22/2017   LDLCALC 110 (H) 02/22/2017   TRIG 82 02/22/2017   CHOLHDL 2.9 02/22/2017    Assessment/Plan  1. Weakness - for home health PT and OT, for therapeutic strengthening exercises, fall precautions   2. Paroxysmal atrial fibrillation (HCC) - rate controlled, continue amiodarone 200 mg give 1/2 tablet = 100 mgdaily and Eliquis 5 mg 1 tab twice a day   3. Essential hypertension - well-controlled, continue losartan 50 mg 1 tab twice a day   4. Essential tremor - scheduled to see the neurologist, Dr.Tat, on 04/01/17    5. Gastroesophageal reflux disease without esophagitis - stable, continue pantoprazole 40 mg 1 tab twice a day   6. Dementia with psychosis - denies hallucinations nor delusions, continue Quetiapine 25 mg 1 tab daily at bedtime   7. Depression, recurrent ([place]) - mood is stable, continue duloxetine 60 mg 1 capsule daily  8. Hyperlipidemia -  continue Ezetimibe 10 mg 1 tab daily   9. Vertigo -  this is a chronic problem , will check BMP and CBC today and will continue meclizine 12.5 mg 1 tab every 8 hours when necessary, no significant labs noted       I have filled out patient's discharge paperwork and written prescriptions.  Patient will receive home health PT, OT, and Nursing.  DME provided: 3 in 1 bedside commode  Total discharge time: Greater than 30 minutes Greater  than 50% was spent in counseling and coordination of care.  Discharge time involved coordination of the discharge process with social worker, nursing staff and therapy department. Medical justification for home health services/DME verified.    [employer] [phone]

## 2017-03-24 NOTE — Progress Notes (Deleted)
CARDIOLOGY OFFICE NOTE  Date:  03/24/2017    [name] Date of Birth: Sep 08, 1926 Medical Record #[id number]  PCP:  Gildardo Cranker, DO  Cardiologist:  [name]  No chief complaint on file.   History of Present Illness: Marissa Bowen is a 83 y.o. female who presents today for a follow up visit. Seen for Dr. [name].   She has a history of CAD s/p CABG in 1997, PAF, HLD, & HTN. She had recurrent AF in 2015. She has been managed with Apixaban for anticoagulation and Dronedarone for rhythm control. She had to change Dronedarone to Amiodarone due to cost.  CHADS2-VASc=6 (age, female, CVA, vascular dz).She had to change ACE inhibitor to angiotensin receptor blocker due to cough.   Last seen in March of 2018 by Dr. [name].   Last seen here in May by Cecilie Kicks, NP - having lower extremity pain and redness. Negative doppler for DVT. Noted her primary concern was regarding stroke.   Comes in today. Here with   Past Medical History:  Diagnosis Date  . Anemia   . Anisocoria 02/27/2017   Sees Dr Rutherford Guys  . Anxiety   . BACK PAIN, LUMBAR 09/27/2009  . CAD (coronary artery disease)    a. s/p remote CABG 1997, 3V.;  b. LexiScan Myoview (8/15): No ischemia, EF 72%, normal study;  c. Echo 12/13 - EF 60-65%, no significant valvular abnormalities, normal RV size and systolic function.   . CVA (cerebral infarction)    a. Thalamic infarct 09/2009. //  b. hx of TIA in 2008  . Gastric ulcer   . GERD 11/25/2006  . GI bleed    a. 09/2009: secondary to antral ulcer.  . Hiatal hernia   . History of diverticulitis Dx 02/2012  . History of MI (myocardial infarction)   . HYPERLIPIDEMIA   . HYPERTENSION   . IBS 12/07/2008  . MIGRAINE WITH AURA 08/13/2007  . Mild carotid artery disease ([place])    a. 0-39% bilat 05/2011 (f/u recommended 05/2013). //  b. Carotid US 3/17 - Bilat ICA 1-39% >> FU prn  . Muscle weakness (generalized) 09/27/2009  . OSTEOARTHRITIS 08/13/2007  . PAF (paroxysmal atrial  fibrillation) ([place]) 11/25/2006   a. Multaq >> changes to Amiodarone 2/2 $$  //  Eliquis (CHADS2-VASc = 6)  . Urinary, incontinence, stress female    wears Pessary    Past Surgical History:  Procedure Laterality Date  . ABDOMINAL HYSTERECTOMY  1973  . CATARACT EXTRACTION W/ INTRAOCULAR LENS  IMPLANT, BILATERAL Bilateral 1999  . CHOLECYSTECTOMY N/A 01/03/2015   Procedure: LAPAROSCOPIC CHOLECYSTECTOMY WITH INTRAOPERATIVE CHOLANGIOGRAM;  Surgeon: Donnie Mesa, MD;  Location: [facility];  Service: General;  Laterality: N/A;  . CORONARY ANGIOPLASTY  1997   Dr Lia Foyer  . CORONARY ARTERY BYPASS GRAFT  1997   Dr Melvenia Needles  . LAPAROSCOPIC CHOLECYSTECTOMY  01/03/2015  . TONSILLECTOMY       Medications: No outpatient medications have been marked as taking for the 03/24/17 encounter (Appointment) with Burtis Junes, NP.     Allergies: Allergies  Allergen Reactions  . Asa [Aspirin] Other (See Comments)    Bleeding ulcer Per MAR  . Crestor [Rosuvastatin Calcium] Other (See Comments)    Myalgia Per MAR  . Hydrocodone Other (See Comments)    Migraines Per MAR  . Ibuprofen Other (See Comments)    Ulcers Per MAR  . Oxycodone Hcl Other (See Comments)     Migraines Per MAR  . Penicillins Swelling  and Rash    Per MAR Has patient had a PCN reaction causing immediate rash, facial/tongue/throat swelling, SOB or lightheadedness with hypotension: Yes Has patient had a PCN reaction causing severe rash involving mucus membranes or skin necrosis: No Has patient had a PCN reaction that required hospitalization No Has patient had a PCN reaction occurring within the last 10 years: No If all of the above answers are "NO", then may proceed with Cephalosporin use.   . Prednisone Other (See Comments)    Ulcers Per MAR  . Statins Other (See Comments)    Aches and pains Per MAR  . Morphine And Related Other (See Comments)     Had hallucinations with morphine sulfate in the past   Per MAR  . Pristiq  [Desvenlafaxine] Other (See Comments)    Drowsiness and hangover sensation Per MAR     Social History: The patient  reports that  has never smoked. she has never used smokeless tobacco. She reports that she does not drink alcohol or use drugs.   Family History: The patient's family history includes Coronary artery disease in her sister; Diabetes in her sister; Heart attack in her mother and sister; Heart disease in her sister; Heart failure in her father; Throat cancer in her sister.   Review of Systems: Please see the history of present illness.   Otherwise, the review of systems is positive for none.   All other systems are reviewed and negative.   Physical Exam: VS:  There were no vitals taken for this visit. Marland Kitchen  BMI There is no height or weight on file to calculate BMI.  Wt Readings from Last 3 Encounters:  03/11/17 149 lb 0.5 oz (67.6 kg)  02/27/17 149 lb 0.5 oz (67.6 kg)  02/27/17 149 lb (67.6 kg)    General: Pleasant. Well developed, well nourished and in no acute distress.   HEENT: Normal.  Neck: Supple, no JVD, carotid bruits, or masses noted.  Cardiac: Regular rate and rhythm. No murmurs, rubs, or gallops. No edema.  Respiratory:  Lungs are clear to auscultation bilaterally with normal work of breathing.  GI: Soft and nontender.  MS: No deformity or atrophy. Gait and ROM intact.  Skin: Warm and dry. Color is normal.  Neuro:  Strength and sensation are intact and no gross focal deficits noted.  Psych: Alert, appropriate and with normal affect.   LABORATORY DATA:  EKG:  EKG is ordered today. This demonstrates .  Lab Results  Component Value Date   WBC 10.5 02/22/2017   HGB 12.7 02/22/2017   HCT 39.1 02/22/2017   PLT 226 02/22/2017   GLUCOSE 93 02/23/2017   CHOL 191 02/22/2017   TRIG 82 02/22/2017   HDL 65 02/22/2017   LDLCALC 110 (H) 02/22/2017   ALT 17 02/22/2017   AST 28 02/22/2017   NA 137 02/23/2017   K 3.7 02/23/2017   CL 100 (L) 02/23/2017    CREATININE 0.60 02/23/2017   BUN 10 02/23/2017   CO2 26 02/23/2017   TSH 0.807 10/05/2016   INR 1.13 02/21/2017   HGBA1C 5.2 02/22/2017     BNP (last 3 results) No results for input(s): BNP in the last 8760 hours.  ProBNP (last 3 results) No results for input(s): PROBNP in the last 8760 hours.   Other Studies Reviewed Today:  Echo Study Conclusions October 2018  - Left ventricle: The cavity size was normal. Wall thickness was   increased increased in a pattern of mild to moderate LVH.  Systolic function was normal. The estimated ejection fraction was   in the range of 60% to 65%. Wall motion was normal; there were no   regional wall motion abnormalities. Doppler parameters are   consistent with abnormal left ventricular relaxation (grade 1   diastolic dysfunction). - Pulmonary arteries: Systolic pressure was mildly increased. PA   peak pressure: 32 mm Hg (S).  Impressions:  - No cardiac source of emboli was indentified.  Assessment/Plan:  1. PAF  2. Chronic anticoagulation  3. Venous insufficiency  4. High risk medicine  Current medicines are reviewed with the patient today.  The patient does not have concerns regarding medicines other than what has been noted above.  The following changes have been made:  See above.  Labs/ tests ordered today include:   No orders of the defined types were placed in this encounter.    Disposition:   FU with *** in {gen number 4-88:[id number]} {Days to years:10300}.   Patient is agreeable to this plan and will call if any problems develop in the interim.   SignedTruitt Merle, NP  03/24/2017 7:41 AM  [facility] [address] [place], [place]  [address] Phone: [phone] Fax: [phone]

## 2017-03-25 ENCOUNTER — Encounter: Payer: Self-pay | Admitting: Nurse Practitioner

## 2017-03-28 ENCOUNTER — Other Ambulatory Visit: Payer: Self-pay | Admitting: Internal Medicine

## 2017-03-31 NOTE — Progress Notes (Deleted)
Subjective:   Marissa Bowen was seen in consultation in the movement disorder clinic at the request of Gildardo Cranker, DO.  The evaluation is for tremor and visual hallucinations/dementia with behavioral change.  The records that were made available to me were reviewed.  Tremor started approximately *** ago and involves the ***.  Tremor is most noticeable when ***.   There is *** family hx of tremor.    Affected by caffeine:  {yes no:314532} Affected by alcohol:  {yes no:314532} Affected by stress:  {yes no:314532} Affected by fatigue:  {yes no:314532} Spills soup if on spoon:  {yes no:314532} Spills glass of liquid if full:  {yes no:314532} Affects ADL's (tying shoes, brushing teeth, etc):  {yes no:314532}  Current/Previously tried tremor medications: ***  Current medications that may exacerbate tremor:  ***been on amiodarone since 11/2012 per med hx  The patient also c/o memory issues for about *** years.  Pt currently in SNF rehab since discharge from the hospital on February 25, 2017.  She was admitted to the hospital after increasing right-sided weakness and being found down.  Police had to be called as her daughter could not get a hold of the patient.  She had had chronic right-sided weakness ever since a stroke in 2011.  Workup for acute stroke in the hospital was negative.  Notes from the hospital indicate that her hospitalization was complicated by dementia with behavioral disturbance.  She was discharged to a rehab facility.  Notes indicate that patient likely going from SNF, where she is currenly lives, to live near daughter in [place] and appears that there is social work consult in [place] on 12/14.  The patient does *** do the finances in the home.  The patient *** drive.  There have *** been any motor vehicle accidents in the recent years.  The patient does *** cook.  There is *** difficulty remembering common recipes.  The stovetop has *** been left on accidentally.  The patient  is *** able to perform ***own ADL's.  The patient is ***able to distribute ***own medications.  The patients bladder and bowel are *** under good control.  There have been *** behavioral changes over the years.  Records from Dr. Linna Darner indicate that the patient described visual hallucinations to him.  Hallucinations involved seeing her sister and husband, both of whom are deceased.  Quetiapine was started while she was in the hospital.  Notes from the nurse practitioner, after Dr. Linna Darner saw the patient, indicate that the patient denied any hallucinations.   Outside reports reviewed: {Outside review:15817}.  Allergies  Allergen Reactions  . Asa [Aspirin] Other (See Comments)    Bleeding ulcer Per MAR  . Crestor [Rosuvastatin Calcium] Other (See Comments)    Myalgia Per MAR  . Hydrocodone Other (See Comments)    Migraines Per MAR  . Ibuprofen Other (See Comments)    Ulcers Per MAR  . Oxycodone Hcl Other (See Comments)     Migraines Per MAR  . Penicillins Swelling and Rash    Per MAR Has patient had a PCN reaction causing immediate rash, facial/tongue/throat swelling, SOB or lightheadedness with hypotension: Yes Has patient had a PCN reaction causing severe rash involving mucus membranes or skin necrosis: No Has patient had a PCN reaction that required hospitalization No Has patient had a PCN reaction occurring within the last 10 years: No If all of the above answers are "NO", then may proceed with Cephalosporin use.   . Prednisone Other (See Comments)  Ulcers Per MAR  . Statins Other (See Comments)    Aches and pains Per MAR  . Morphine And Related Other (See Comments)     Had hallucinations with morphine sulfate in the past   Per MAR  . Pristiq [Desvenlafaxine] Other (See Comments)    Drowsiness and hangover sensation Per [facility]     Outpatient Encounter Medications as of 04/01/2017  Medication Sig  . acetaminophen (TYLENOL) 325 MG tablet Take 650 mg by mouth every 6 (six)  hours as needed for moderate pain.   Marland Kitchen amiodarone (PACERONE) 200 MG tablet TAKE ONE-HALF TABLET DAILY  . bisacodyl (DULCOLAX) 10 MG suppository Place 10 mg rectally as needed for moderate constipation.  . DULoxetine (CYMBALTA) 60 MG capsule Take 1 capsule (60 mg total) by mouth daily. For mood and chronic pain  . ELIQUIS 5 MG TABS tablet Take 1 tablet (5 mg total) by mouth 2 (two) times daily.  Marland Kitchen ezetimibe (ZETIA) 10 MG tablet Take 1 tablet (10 mg total) by mouth daily.  Marland Kitchen losartan (COZAAR) 50 MG tablet TAKE ONE TABLET TWICE DAILY  . magnesium hydroxide (MILK OF MAGNESIA) 400 MG/5ML suspension Take 30 mLs by mouth daily as needed for mild constipation.  . meclizine (ANTIVERT) 12.5 MG tablet Take 12.5 mg by mouth every 8 (eight) hours as needed for dizziness.   . Multiple Vitamin (MULTIVITAMIN WITH MINERALS) TABS tablet Take 1 tablet by mouth daily.  . Multiple Vitamins-Minerals (PRESERVISION AREDS) TABS Take 1 tablet by mouth daily.   . niacin 500 MG CR capsule Take 500 mg by mouth 2 (two) times daily.   . pantoprazole (PROTONIX) 40 MG tablet TAKE ONE TABLET TWICE DAILY  . QUEtiapine (SEROQUEL) 25 MG tablet Take 1 tablet (25 mg total) by mouth at bedtime.  . sodium chloride (OCEAN) 0.65 % SOLN nasal spray Place 1 spray into both nostrils at bedtime.   . Sodium Phosphates (RA SALINE ENEMA) 19-7 GM/118ML ENEM Place 118 mLs rectally daily as needed (FOR CONSTIPATION).  . Tetrahydrozoline HCl (VISINE OP) Place 2 drops into both eyes at bedtime.    No facility-administered encounter medications on file as of 04/01/2017.     Past Medical History:  Diagnosis Date  . Anemia   . Anisocoria 02/27/2017   Sees Dr Rutherford Guys  . Anxiety   . BACK PAIN, LUMBAR 09/27/2009  . CAD (coronary artery disease)    a. s/p remote CABG 1997, 3V.;  b. LexiScan Myoview (8/15): No ischemia, EF 72%, normal study;  c. Echo 12/13 - EF 60-65%, no significant valvular abnormalities, normal RV size and systolic function.     . CVA (cerebral infarction)    a. Thalamic infarct 09/2009. //  b. hx of TIA in 2008  . Gastric ulcer   . GERD 11/25/2006  . GI bleed    a. 09/2009: secondary to antral ulcer.  . Hiatal hernia   . History of diverticulitis Dx 02/2012  . History of MI (myocardial infarction)   . HYPERLIPIDEMIA   . HYPERTENSION   . IBS 12/07/2008  . MIGRAINE WITH AURA 08/13/2007  . Mild carotid artery disease ([place])    a. 0-39% bilat 05/2011 (f/u recommended 05/2013). //  b. Carotid US 3/17 - Bilat ICA 1-39% >> FU prn  . Muscle weakness (generalized) 09/27/2009  . OSTEOARTHRITIS 08/13/2007  . PAF (paroxysmal atrial fibrillation) ([place]) 11/25/2006   a. Multaq >> changes to Amiodarone 2/2 $$  //  Eliquis (CHADS2-VASc = 6)  . Urinary, incontinence, stress female  wears Pessary    Past Surgical History:  Procedure Laterality Date  . ABDOMINAL HYSTERECTOMY  1973  . CATARACT EXTRACTION W/ INTRAOCULAR LENS  IMPLANT, BILATERAL Bilateral 1999  . CHOLECYSTECTOMY N/A 01/03/2015   Procedure: LAPAROSCOPIC CHOLECYSTECTOMY WITH INTRAOPERATIVE CHOLANGIOGRAM;  Surgeon: Donnie Mesa, MD;  Location: [facility];  Service: General;  Laterality: N/A;  . CORONARY ANGIOPLASTY  1997   Dr Lia Foyer  . CORONARY ARTERY BYPASS GRAFT  1997   Dr Melvenia Needles  . LAPAROSCOPIC CHOLECYSTECTOMY  01/03/2015  . TONSILLECTOMY      Social History   Socioeconomic History  . Marital status: Widowed    Spouse name: Not on file  . Number of children: Not on file  . Years of education: Not on file  . Highest education level: Not on file  Social Needs  . Financial resource strain: Not on file  . Food insecurity - worry: Not on file  . Food insecurity - inability: Not on file  . Transportation needs - medical: Not on file  . Transportation needs - non-medical: Not on file  Occupational History  . Not on file  Tobacco Use  . Smoking status: Never Smoker  . Smokeless tobacco: Never Used  Substance and Sexual Activity  . Alcohol use: No  . Drug use:  No  . Sexual activity: No  Other Topics Concern  . Not on file  Social History Narrative  . Not on file    Family Status  Relation Name Status  . Mother  Deceased  . Father  Deceased  . Sister Lucita Ferrara Deceased  . Sister Blanch Media Deceased  . Sister [name] Deceased  . Brother Fritz Pickerel Deceased  . Daughter [employer]  . Son Briscoe Burns  . Sister Letitia Libra  . Daughter [employer]  . [employer]  Deceased  . MGF  Deceased  . PGM  Deceased  . PGF  Deceased  . Neg Hx  (Not Specified)    Review of Systems A complete 10 system ROS was obtained and was negative apart from what is mentioned.   Objective:   VITALS:  There were no vitals filed for this visit. Gen:  Appears stated age and in NAD. HEENT:  Normocephalic, atraumatic. The mucous membranes are moist. The superficial temporal arteries are without ropiness or tenderness. Cardiovascular: Regular rate and rhythm. Lungs: Clear to auscultation bilaterally. Neck: There are no carotid bruits noted bilaterally.  NEUROLOGICAL:  Orientation:  The patient is alert and oriented x 3.  Recent and remote memory are intact.  Attention span and concentration are normal.  Able to name objects and repeat without trouble.  Fund of knowledge is appropriate Cranial nerves: There is good facial symmetry. The pupils are equal round and reactive to light bilaterally. Fundoscopic exam reveals clear disc margins bilaterally. Extraocular muscles are intact and visual fields are full to confrontational testing. Speech is fluent and clear. Soft palate rises symmetrically and there is no tongue deviation. Hearing is intact to conversational tone. Tone: Tone is good throughout. Sensation: Sensation is intact to light touch and pinprick throughout (facial, trunk, extremities). Vibration is intact at the bilateral big toe. There is no extinction with double simultaneous stimulation. There is no sensory dermatomal level identified. Coordination:  The patient has no  dysdiadichokinesia or dysmetria. Motor: Strength is 5/5 in the bilateral upper and lower extremities.  Shoulder shrug is equal bilaterally.  There is no pronator drift.  There are no fasciculations noted. DTR's: Deep tendon reflexes are 2/4 at the bilateral  biceps, triceps, brachioradialis, patella and achilles.  Plantar responses are downgoing bilaterally. Gait and Station: The patient is able to ambulate without difficulty. The patient is able to heel toe walk without any difficulty. The patient is able to ambulate in a tandem fashion. The patient is able to stand in the Romberg position.   MOVEMENT EXAM: Tremor:  There is *** tremor in the UE, noted most significantly with action.  The patient is *** able to draw Archimedes spirals without significant difficulty.  There is *** tremor at rest.  The patient is *** able to pour water from one glass to another without spilling it.     Assessment/Plan:   1.  Tremor  -I did tell the patient that while she may have essential tremor, because she is also on amiodarone it would be difficult to rule out that as the cause of tremor, or at least a significant source of exacerbation of tremor.  Pt has been on amiodarone at least since 11/2012 per records. I told the patient that about 1/3 of patients who are on amiodarone will have some degree of tremor.  I did not advise the patient stop or alter the medication in any way, but did tell the patient that if the medication is stopped for any reason, it can take up to 6 months to know if the tremor was from the medication.  I do not recommend adding any further medication, however, to potentially help the tremor as this is more likely to cause side effects than help her with the tremor.  2.  Memory change with hallucinations  -The patient presents with dementia ***with hallucinations.  It is my suspicion that the patient has had dementia for a long time, but that her family lives far away and the patient was  probably covering this up to some degree.  In addition, the hallucinations were likely made worse by her hospital stay and environmental change.  The hospital start her on quetiapine. We did talk about the fact that the atypical antipsychotic medications are not indicated for dementia related psychosis and increased risk of mortality in the elderly, usually because of infectious or  cardiac related. Understanding is expressed and they were agreeable that the benefits outweigh the risks in this case, especially since she is likely going to be moving to [place] and experiencing yet another environmental change here in the near future.  3.  ***This did not include the 40 min of record review which was detailed above, which was non face to face time.    CC:  Gildardo Cranker, DO

## 2017-04-01 ENCOUNTER — Ambulatory Visit: Payer: Medicare Other | Admitting: Neurology

## 2017-04-09 DIAGNOSIS — I2510 Atherosclerotic heart disease of native coronary artery without angina pectoris: Secondary | ICD-10-CM | POA: Diagnosis not present

## 2017-04-09 DIAGNOSIS — K219 Gastro-esophageal reflux disease without esophagitis: Secondary | ICD-10-CM | POA: Diagnosis not present

## 2017-04-09 DIAGNOSIS — R413 Other amnesia: Secondary | ICD-10-CM | POA: Diagnosis not present

## 2017-04-09 DIAGNOSIS — I4891 Unspecified atrial fibrillation: Secondary | ICD-10-CM | POA: Diagnosis not present

## 2017-04-09 DIAGNOSIS — F329 Major depressive disorder, single episode, unspecified: Secondary | ICD-10-CM | POA: Diagnosis not present

## 2017-04-09 DIAGNOSIS — Z6823 Body mass index (BMI) 23.0-23.9, adult: Secondary | ICD-10-CM | POA: Diagnosis not present

## 2017-04-09 DIAGNOSIS — E7800 Pure hypercholesterolemia, unspecified: Secondary | ICD-10-CM | POA: Diagnosis not present

## 2017-04-14 DIAGNOSIS — Z6823 Body mass index (BMI) 23.0-23.9, adult: Secondary | ICD-10-CM | POA: Diagnosis not present

## 2017-04-14 DIAGNOSIS — G309 Alzheimer's disease, unspecified: Secondary | ICD-10-CM | POA: Diagnosis not present

## 2017-04-14 DIAGNOSIS — F0150 Vascular dementia without behavioral disturbance: Secondary | ICD-10-CM | POA: Diagnosis not present

## 2017-04-17 DIAGNOSIS — I4891 Unspecified atrial fibrillation: Secondary | ICD-10-CM | POA: Diagnosis not present

## 2017-04-17 DIAGNOSIS — I2510 Atherosclerotic heart disease of native coronary artery without angina pectoris: Secondary | ICD-10-CM | POA: Diagnosis not present

## 2017-04-17 DIAGNOSIS — F0390 Unspecified dementia without behavioral disturbance: Secondary | ICD-10-CM | POA: Diagnosis not present

## 2017-04-17 DIAGNOSIS — M1991 Primary osteoarthritis, unspecified site: Secondary | ICD-10-CM | POA: Diagnosis not present

## 2017-04-17 DIAGNOSIS — F329 Major depressive disorder, single episode, unspecified: Secondary | ICD-10-CM | POA: Diagnosis not present

## 2017-04-17 DIAGNOSIS — F320 Major depressive disorder, single episode, mild: Secondary | ICD-10-CM | POA: Diagnosis not present

## 2017-04-17 DIAGNOSIS — F419 Anxiety disorder, unspecified: Secondary | ICD-10-CM | POA: Diagnosis not present

## 2017-04-18 DIAGNOSIS — F419 Anxiety disorder, unspecified: Secondary | ICD-10-CM | POA: Diagnosis not present

## 2017-04-18 DIAGNOSIS — I2510 Atherosclerotic heart disease of native coronary artery without angina pectoris: Secondary | ICD-10-CM | POA: Diagnosis not present

## 2017-04-18 DIAGNOSIS — F329 Major depressive disorder, single episode, unspecified: Secondary | ICD-10-CM | POA: Diagnosis not present

## 2017-04-18 DIAGNOSIS — I4891 Unspecified atrial fibrillation: Secondary | ICD-10-CM | POA: Diagnosis not present

## 2017-04-18 DIAGNOSIS — M1991 Primary osteoarthritis, unspecified site: Secondary | ICD-10-CM | POA: Diagnosis not present

## 2017-04-18 DIAGNOSIS — F0390 Unspecified dementia without behavioral disturbance: Secondary | ICD-10-CM | POA: Diagnosis not present

## 2017-04-23 DIAGNOSIS — I2510 Atherosclerotic heart disease of native coronary artery without angina pectoris: Secondary | ICD-10-CM | POA: Diagnosis not present

## 2017-04-23 DIAGNOSIS — F329 Major depressive disorder, single episode, unspecified: Secondary | ICD-10-CM | POA: Diagnosis not present

## 2017-04-23 DIAGNOSIS — F0390 Unspecified dementia without behavioral disturbance: Secondary | ICD-10-CM | POA: Diagnosis not present

## 2017-04-23 DIAGNOSIS — F419 Anxiety disorder, unspecified: Secondary | ICD-10-CM | POA: Diagnosis not present

## 2017-04-23 DIAGNOSIS — I4891 Unspecified atrial fibrillation: Secondary | ICD-10-CM | POA: Diagnosis not present

## 2017-04-23 DIAGNOSIS — M1991 Primary osteoarthritis, unspecified site: Secondary | ICD-10-CM | POA: Diagnosis not present

## 2017-04-24 DIAGNOSIS — F0390 Unspecified dementia without behavioral disturbance: Secondary | ICD-10-CM | POA: Diagnosis not present

## 2017-04-24 DIAGNOSIS — F419 Anxiety disorder, unspecified: Secondary | ICD-10-CM | POA: Diagnosis not present

## 2017-04-24 DIAGNOSIS — F329 Major depressive disorder, single episode, unspecified: Secondary | ICD-10-CM | POA: Diagnosis not present

## 2017-04-24 DIAGNOSIS — I2510 Atherosclerotic heart disease of native coronary artery without angina pectoris: Secondary | ICD-10-CM | POA: Diagnosis not present

## 2017-04-24 DIAGNOSIS — M1991 Primary osteoarthritis, unspecified site: Secondary | ICD-10-CM | POA: Diagnosis not present

## 2017-04-24 DIAGNOSIS — I4891 Unspecified atrial fibrillation: Secondary | ICD-10-CM | POA: Diagnosis not present

## 2017-04-25 DIAGNOSIS — F419 Anxiety disorder, unspecified: Secondary | ICD-10-CM | POA: Diagnosis not present

## 2017-04-25 DIAGNOSIS — F329 Major depressive disorder, single episode, unspecified: Secondary | ICD-10-CM | POA: Diagnosis not present

## 2017-04-25 DIAGNOSIS — M1991 Primary osteoarthritis, unspecified site: Secondary | ICD-10-CM | POA: Diagnosis not present

## 2017-04-25 DIAGNOSIS — F0390 Unspecified dementia without behavioral disturbance: Secondary | ICD-10-CM | POA: Diagnosis not present

## 2017-04-25 DIAGNOSIS — I2510 Atherosclerotic heart disease of native coronary artery without angina pectoris: Secondary | ICD-10-CM | POA: Diagnosis not present

## 2017-04-25 DIAGNOSIS — I4891 Unspecified atrial fibrillation: Secondary | ICD-10-CM | POA: Diagnosis not present

## 2017-04-28 DIAGNOSIS — I2510 Atherosclerotic heart disease of native coronary artery without angina pectoris: Secondary | ICD-10-CM | POA: Diagnosis not present

## 2017-04-28 DIAGNOSIS — I4891 Unspecified atrial fibrillation: Secondary | ICD-10-CM | POA: Diagnosis not present

## 2017-04-28 DIAGNOSIS — F329 Major depressive disorder, single episode, unspecified: Secondary | ICD-10-CM | POA: Diagnosis not present

## 2017-04-28 DIAGNOSIS — F0390 Unspecified dementia without behavioral disturbance: Secondary | ICD-10-CM | POA: Diagnosis not present

## 2017-04-28 DIAGNOSIS — F419 Anxiety disorder, unspecified: Secondary | ICD-10-CM | POA: Diagnosis not present

## 2017-04-28 DIAGNOSIS — M1991 Primary osteoarthritis, unspecified site: Secondary | ICD-10-CM | POA: Diagnosis not present

## 2017-04-30 ENCOUNTER — Telehealth: Payer: Self-pay | Admitting: Internal Medicine

## 2017-04-30 DIAGNOSIS — M1991 Primary osteoarthritis, unspecified site: Secondary | ICD-10-CM | POA: Diagnosis not present

## 2017-04-30 DIAGNOSIS — F0390 Unspecified dementia without behavioral disturbance: Secondary | ICD-10-CM | POA: Diagnosis not present

## 2017-04-30 DIAGNOSIS — I2510 Atherosclerotic heart disease of native coronary artery without angina pectoris: Secondary | ICD-10-CM | POA: Diagnosis not present

## 2017-04-30 DIAGNOSIS — F329 Major depressive disorder, single episode, unspecified: Secondary | ICD-10-CM | POA: Diagnosis not present

## 2017-04-30 DIAGNOSIS — F419 Anxiety disorder, unspecified: Secondary | ICD-10-CM | POA: Diagnosis not present

## 2017-04-30 DIAGNOSIS — I4891 Unspecified atrial fibrillation: Secondary | ICD-10-CM | POA: Diagnosis not present

## 2017-04-30 NOTE — Telephone Encounter (Signed)
I spoke with the patient's daughter, Kelvin Cellar.  She stated that her mom now lives in [place] with her sister and is seeing a primary care doctor there. VDM (DD)

## 2017-05-01 DIAGNOSIS — I4891 Unspecified atrial fibrillation: Secondary | ICD-10-CM | POA: Diagnosis not present

## 2017-05-01 DIAGNOSIS — M1991 Primary osteoarthritis, unspecified site: Secondary | ICD-10-CM | POA: Diagnosis not present

## 2017-05-01 DIAGNOSIS — F419 Anxiety disorder, unspecified: Secondary | ICD-10-CM | POA: Diagnosis not present

## 2017-05-01 DIAGNOSIS — I2510 Atherosclerotic heart disease of native coronary artery without angina pectoris: Secondary | ICD-10-CM | POA: Diagnosis not present

## 2017-05-01 DIAGNOSIS — F0390 Unspecified dementia without behavioral disturbance: Secondary | ICD-10-CM | POA: Diagnosis not present

## 2017-05-01 DIAGNOSIS — F329 Major depressive disorder, single episode, unspecified: Secondary | ICD-10-CM | POA: Diagnosis not present

## 2017-05-02 DIAGNOSIS — M1991 Primary osteoarthritis, unspecified site: Secondary | ICD-10-CM | POA: Diagnosis not present

## 2017-05-02 DIAGNOSIS — F329 Major depressive disorder, single episode, unspecified: Secondary | ICD-10-CM | POA: Diagnosis not present

## 2017-05-02 DIAGNOSIS — I2510 Atherosclerotic heart disease of native coronary artery without angina pectoris: Secondary | ICD-10-CM | POA: Diagnosis not present

## 2017-05-02 DIAGNOSIS — I4891 Unspecified atrial fibrillation: Secondary | ICD-10-CM | POA: Diagnosis not present

## 2017-05-02 DIAGNOSIS — F0390 Unspecified dementia without behavioral disturbance: Secondary | ICD-10-CM | POA: Diagnosis not present

## 2017-05-02 DIAGNOSIS — F419 Anxiety disorder, unspecified: Secondary | ICD-10-CM | POA: Diagnosis not present

## 2017-05-05 DIAGNOSIS — F0390 Unspecified dementia without behavioral disturbance: Secondary | ICD-10-CM | POA: Diagnosis not present

## 2017-05-05 DIAGNOSIS — I2510 Atherosclerotic heart disease of native coronary artery without angina pectoris: Secondary | ICD-10-CM | POA: Diagnosis not present

## 2017-05-05 DIAGNOSIS — M1991 Primary osteoarthritis, unspecified site: Secondary | ICD-10-CM | POA: Diagnosis not present

## 2017-05-05 DIAGNOSIS — F329 Major depressive disorder, single episode, unspecified: Secondary | ICD-10-CM | POA: Diagnosis not present

## 2017-05-05 DIAGNOSIS — I4891 Unspecified atrial fibrillation: Secondary | ICD-10-CM | POA: Diagnosis not present

## 2017-05-05 DIAGNOSIS — F419 Anxiety disorder, unspecified: Secondary | ICD-10-CM | POA: Diagnosis not present

## 2017-05-06 DIAGNOSIS — F329 Major depressive disorder, single episode, unspecified: Secondary | ICD-10-CM | POA: Diagnosis not present

## 2017-05-06 DIAGNOSIS — F0390 Unspecified dementia without behavioral disturbance: Secondary | ICD-10-CM | POA: Diagnosis not present

## 2017-05-06 DIAGNOSIS — M1991 Primary osteoarthritis, unspecified site: Secondary | ICD-10-CM | POA: Diagnosis not present

## 2017-05-06 DIAGNOSIS — F419 Anxiety disorder, unspecified: Secondary | ICD-10-CM | POA: Diagnosis not present

## 2017-05-06 DIAGNOSIS — I4891 Unspecified atrial fibrillation: Secondary | ICD-10-CM | POA: Diagnosis not present

## 2017-05-06 DIAGNOSIS — I2510 Atherosclerotic heart disease of native coronary artery without angina pectoris: Secondary | ICD-10-CM | POA: Diagnosis not present

## 2017-05-07 DIAGNOSIS — F329 Major depressive disorder, single episode, unspecified: Secondary | ICD-10-CM | POA: Diagnosis not present

## 2017-05-07 DIAGNOSIS — F0390 Unspecified dementia without behavioral disturbance: Secondary | ICD-10-CM | POA: Diagnosis not present

## 2017-05-07 DIAGNOSIS — H18413 Arcus senilis, bilateral: Secondary | ICD-10-CM | POA: Diagnosis not present

## 2017-05-07 DIAGNOSIS — F419 Anxiety disorder, unspecified: Secondary | ICD-10-CM | POA: Diagnosis not present

## 2017-05-07 DIAGNOSIS — M1991 Primary osteoarthritis, unspecified site: Secondary | ICD-10-CM | POA: Diagnosis not present

## 2017-05-07 DIAGNOSIS — Z961 Presence of intraocular lens: Secondary | ICD-10-CM | POA: Diagnosis not present

## 2017-05-07 DIAGNOSIS — I2510 Atherosclerotic heart disease of native coronary artery without angina pectoris: Secondary | ICD-10-CM | POA: Diagnosis not present

## 2017-05-07 DIAGNOSIS — I4891 Unspecified atrial fibrillation: Secondary | ICD-10-CM | POA: Diagnosis not present

## 2017-05-07 DIAGNOSIS — H524 Presbyopia: Secondary | ICD-10-CM | POA: Diagnosis not present

## 2017-05-07 DIAGNOSIS — H26491 Other secondary cataract, right eye: Secondary | ICD-10-CM | POA: Diagnosis not present

## 2017-05-08 DIAGNOSIS — I2510 Atherosclerotic heart disease of native coronary artery without angina pectoris: Secondary | ICD-10-CM | POA: Diagnosis not present

## 2017-05-08 DIAGNOSIS — F329 Major depressive disorder, single episode, unspecified: Secondary | ICD-10-CM | POA: Diagnosis not present

## 2017-05-08 DIAGNOSIS — I4891 Unspecified atrial fibrillation: Secondary | ICD-10-CM | POA: Diagnosis not present

## 2017-05-08 DIAGNOSIS — F419 Anxiety disorder, unspecified: Secondary | ICD-10-CM | POA: Diagnosis not present

## 2017-05-08 DIAGNOSIS — M1991 Primary osteoarthritis, unspecified site: Secondary | ICD-10-CM | POA: Diagnosis not present

## 2017-05-08 DIAGNOSIS — F0390 Unspecified dementia without behavioral disturbance: Secondary | ICD-10-CM | POA: Diagnosis not present

## 2017-05-09 DIAGNOSIS — I4891 Unspecified atrial fibrillation: Secondary | ICD-10-CM | POA: Diagnosis not present

## 2017-05-09 DIAGNOSIS — F419 Anxiety disorder, unspecified: Secondary | ICD-10-CM | POA: Diagnosis not present

## 2017-05-09 DIAGNOSIS — I2510 Atherosclerotic heart disease of native coronary artery without angina pectoris: Secondary | ICD-10-CM | POA: Diagnosis not present

## 2017-05-09 DIAGNOSIS — F0390 Unspecified dementia without behavioral disturbance: Secondary | ICD-10-CM | POA: Diagnosis not present

## 2017-05-09 DIAGNOSIS — M1991 Primary osteoarthritis, unspecified site: Secondary | ICD-10-CM | POA: Diagnosis not present

## 2017-05-09 DIAGNOSIS — F329 Major depressive disorder, single episode, unspecified: Secondary | ICD-10-CM | POA: Diagnosis not present

## 2017-05-12 DIAGNOSIS — F329 Major depressive disorder, single episode, unspecified: Secondary | ICD-10-CM | POA: Diagnosis not present

## 2017-05-12 DIAGNOSIS — F0390 Unspecified dementia without behavioral disturbance: Secondary | ICD-10-CM | POA: Diagnosis not present

## 2017-05-12 DIAGNOSIS — M1991 Primary osteoarthritis, unspecified site: Secondary | ICD-10-CM | POA: Diagnosis not present

## 2017-05-12 DIAGNOSIS — F419 Anxiety disorder, unspecified: Secondary | ICD-10-CM | POA: Diagnosis not present

## 2017-05-12 DIAGNOSIS — I4891 Unspecified atrial fibrillation: Secondary | ICD-10-CM | POA: Diagnosis not present

## 2017-05-12 DIAGNOSIS — I2510 Atherosclerotic heart disease of native coronary artery without angina pectoris: Secondary | ICD-10-CM | POA: Diagnosis not present

## 2017-05-13 DIAGNOSIS — F329 Major depressive disorder, single episode, unspecified: Secondary | ICD-10-CM | POA: Diagnosis not present

## 2017-05-13 DIAGNOSIS — I2510 Atherosclerotic heart disease of native coronary artery without angina pectoris: Secondary | ICD-10-CM | POA: Diagnosis not present

## 2017-05-13 DIAGNOSIS — F419 Anxiety disorder, unspecified: Secondary | ICD-10-CM | POA: Diagnosis not present

## 2017-05-13 DIAGNOSIS — F0390 Unspecified dementia without behavioral disturbance: Secondary | ICD-10-CM | POA: Diagnosis not present

## 2017-05-13 DIAGNOSIS — M1991 Primary osteoarthritis, unspecified site: Secondary | ICD-10-CM | POA: Diagnosis not present

## 2017-05-13 DIAGNOSIS — I4891 Unspecified atrial fibrillation: Secondary | ICD-10-CM | POA: Diagnosis not present

## 2017-05-14 DIAGNOSIS — F419 Anxiety disorder, unspecified: Secondary | ICD-10-CM | POA: Diagnosis not present

## 2017-05-14 DIAGNOSIS — I2510 Atherosclerotic heart disease of native coronary artery without angina pectoris: Secondary | ICD-10-CM | POA: Diagnosis not present

## 2017-05-14 DIAGNOSIS — F329 Major depressive disorder, single episode, unspecified: Secondary | ICD-10-CM | POA: Diagnosis not present

## 2017-05-14 DIAGNOSIS — I4891 Unspecified atrial fibrillation: Secondary | ICD-10-CM | POA: Diagnosis not present

## 2017-05-14 DIAGNOSIS — Z6823 Body mass index (BMI) 23.0-23.9, adult: Secondary | ICD-10-CM | POA: Diagnosis not present

## 2017-05-14 DIAGNOSIS — F0390 Unspecified dementia without behavioral disturbance: Secondary | ICD-10-CM | POA: Diagnosis not present

## 2017-05-14 DIAGNOSIS — M1991 Primary osteoarthritis, unspecified site: Secondary | ICD-10-CM | POA: Diagnosis not present

## 2017-05-15 DIAGNOSIS — I2510 Atherosclerotic heart disease of native coronary artery without angina pectoris: Secondary | ICD-10-CM | POA: Diagnosis not present

## 2017-05-15 DIAGNOSIS — M1991 Primary osteoarthritis, unspecified site: Secondary | ICD-10-CM | POA: Diagnosis not present

## 2017-05-15 DIAGNOSIS — F0390 Unspecified dementia without behavioral disturbance: Secondary | ICD-10-CM | POA: Diagnosis not present

## 2017-05-15 DIAGNOSIS — I4891 Unspecified atrial fibrillation: Secondary | ICD-10-CM | POA: Diagnosis not present

## 2017-05-15 DIAGNOSIS — F419 Anxiety disorder, unspecified: Secondary | ICD-10-CM | POA: Diagnosis not present

## 2017-05-15 DIAGNOSIS — F329 Major depressive disorder, single episode, unspecified: Secondary | ICD-10-CM | POA: Diagnosis not present

## 2017-05-16 DIAGNOSIS — F0390 Unspecified dementia without behavioral disturbance: Secondary | ICD-10-CM | POA: Diagnosis not present

## 2017-05-16 DIAGNOSIS — I2510 Atherosclerotic heart disease of native coronary artery without angina pectoris: Secondary | ICD-10-CM | POA: Diagnosis not present

## 2017-05-16 DIAGNOSIS — I4891 Unspecified atrial fibrillation: Secondary | ICD-10-CM | POA: Diagnosis not present

## 2017-05-16 DIAGNOSIS — F419 Anxiety disorder, unspecified: Secondary | ICD-10-CM | POA: Diagnosis not present

## 2017-05-16 DIAGNOSIS — M1991 Primary osteoarthritis, unspecified site: Secondary | ICD-10-CM | POA: Diagnosis not present

## 2017-05-16 DIAGNOSIS — F329 Major depressive disorder, single episode, unspecified: Secondary | ICD-10-CM | POA: Diagnosis not present

## 2017-05-19 DIAGNOSIS — F329 Major depressive disorder, single episode, unspecified: Secondary | ICD-10-CM | POA: Diagnosis not present

## 2017-05-19 DIAGNOSIS — F0390 Unspecified dementia without behavioral disturbance: Secondary | ICD-10-CM | POA: Diagnosis not present

## 2017-05-19 DIAGNOSIS — I2510 Atherosclerotic heart disease of native coronary artery without angina pectoris: Secondary | ICD-10-CM | POA: Diagnosis not present

## 2017-05-19 DIAGNOSIS — F419 Anxiety disorder, unspecified: Secondary | ICD-10-CM | POA: Diagnosis not present

## 2017-05-19 DIAGNOSIS — M1991 Primary osteoarthritis, unspecified site: Secondary | ICD-10-CM | POA: Diagnosis not present

## 2017-05-19 DIAGNOSIS — I4891 Unspecified atrial fibrillation: Secondary | ICD-10-CM | POA: Diagnosis not present

## 2017-05-20 DIAGNOSIS — M1991 Primary osteoarthritis, unspecified site: Secondary | ICD-10-CM | POA: Diagnosis not present

## 2017-05-20 DIAGNOSIS — F0390 Unspecified dementia without behavioral disturbance: Secondary | ICD-10-CM | POA: Diagnosis not present

## 2017-05-20 DIAGNOSIS — F329 Major depressive disorder, single episode, unspecified: Secondary | ICD-10-CM | POA: Diagnosis not present

## 2017-05-20 DIAGNOSIS — F419 Anxiety disorder, unspecified: Secondary | ICD-10-CM | POA: Diagnosis not present

## 2017-05-20 DIAGNOSIS — I4891 Unspecified atrial fibrillation: Secondary | ICD-10-CM | POA: Diagnosis not present

## 2017-05-20 DIAGNOSIS — I2510 Atherosclerotic heart disease of native coronary artery without angina pectoris: Secondary | ICD-10-CM | POA: Diagnosis not present

## 2017-05-21 DIAGNOSIS — F0390 Unspecified dementia without behavioral disturbance: Secondary | ICD-10-CM | POA: Diagnosis not present

## 2017-05-21 DIAGNOSIS — F419 Anxiety disorder, unspecified: Secondary | ICD-10-CM | POA: Diagnosis not present

## 2017-05-21 DIAGNOSIS — M1991 Primary osteoarthritis, unspecified site: Secondary | ICD-10-CM | POA: Diagnosis not present

## 2017-05-21 DIAGNOSIS — I4891 Unspecified atrial fibrillation: Secondary | ICD-10-CM | POA: Diagnosis not present

## 2017-05-21 DIAGNOSIS — I2510 Atherosclerotic heart disease of native coronary artery without angina pectoris: Secondary | ICD-10-CM | POA: Diagnosis not present

## 2017-05-21 DIAGNOSIS — F329 Major depressive disorder, single episode, unspecified: Secondary | ICD-10-CM | POA: Diagnosis not present

## 2017-05-22 DIAGNOSIS — I4891 Unspecified atrial fibrillation: Secondary | ICD-10-CM | POA: Diagnosis not present

## 2017-05-22 DIAGNOSIS — F419 Anxiety disorder, unspecified: Secondary | ICD-10-CM | POA: Diagnosis not present

## 2017-05-22 DIAGNOSIS — I2510 Atherosclerotic heart disease of native coronary artery without angina pectoris: Secondary | ICD-10-CM | POA: Diagnosis not present

## 2017-05-22 DIAGNOSIS — F0390 Unspecified dementia without behavioral disturbance: Secondary | ICD-10-CM | POA: Diagnosis not present

## 2017-05-22 DIAGNOSIS — F329 Major depressive disorder, single episode, unspecified: Secondary | ICD-10-CM | POA: Diagnosis not present

## 2017-05-22 DIAGNOSIS — M1991 Primary osteoarthritis, unspecified site: Secondary | ICD-10-CM | POA: Diagnosis not present

## 2017-05-23 DIAGNOSIS — F329 Major depressive disorder, single episode, unspecified: Secondary | ICD-10-CM | POA: Diagnosis not present

## 2017-05-23 DIAGNOSIS — F419 Anxiety disorder, unspecified: Secondary | ICD-10-CM | POA: Diagnosis not present

## 2017-05-23 DIAGNOSIS — F0390 Unspecified dementia without behavioral disturbance: Secondary | ICD-10-CM | POA: Diagnosis not present

## 2017-05-23 DIAGNOSIS — I4891 Unspecified atrial fibrillation: Secondary | ICD-10-CM | POA: Diagnosis not present

## 2017-05-23 DIAGNOSIS — M1991 Primary osteoarthritis, unspecified site: Secondary | ICD-10-CM | POA: Diagnosis not present

## 2017-05-23 DIAGNOSIS — I2510 Atherosclerotic heart disease of native coronary artery without angina pectoris: Secondary | ICD-10-CM | POA: Diagnosis not present

## 2017-05-25 DIAGNOSIS — R531 Weakness: Secondary | ICD-10-CM | POA: Diagnosis not present

## 2017-05-25 DIAGNOSIS — N390 Urinary tract infection, site not specified: Secondary | ICD-10-CM | POA: Diagnosis not present

## 2017-05-25 DIAGNOSIS — R42 Dizziness and giddiness: Secondary | ICD-10-CM | POA: Diagnosis not present

## 2017-05-27 DIAGNOSIS — F329 Major depressive disorder, single episode, unspecified: Secondary | ICD-10-CM | POA: Diagnosis not present

## 2017-05-27 DIAGNOSIS — I4891 Unspecified atrial fibrillation: Secondary | ICD-10-CM | POA: Diagnosis not present

## 2017-05-27 DIAGNOSIS — F0390 Unspecified dementia without behavioral disturbance: Secondary | ICD-10-CM | POA: Diagnosis not present

## 2017-05-27 DIAGNOSIS — F419 Anxiety disorder, unspecified: Secondary | ICD-10-CM | POA: Diagnosis not present

## 2017-05-27 DIAGNOSIS — M1991 Primary osteoarthritis, unspecified site: Secondary | ICD-10-CM | POA: Diagnosis not present

## 2017-05-27 DIAGNOSIS — I2510 Atherosclerotic heart disease of native coronary artery without angina pectoris: Secondary | ICD-10-CM | POA: Diagnosis not present

## 2017-05-28 DIAGNOSIS — F419 Anxiety disorder, unspecified: Secondary | ICD-10-CM | POA: Diagnosis not present

## 2017-05-28 DIAGNOSIS — I2510 Atherosclerotic heart disease of native coronary artery without angina pectoris: Secondary | ICD-10-CM | POA: Diagnosis not present

## 2017-05-28 DIAGNOSIS — I4891 Unspecified atrial fibrillation: Secondary | ICD-10-CM | POA: Diagnosis not present

## 2017-05-28 DIAGNOSIS — F0390 Unspecified dementia without behavioral disturbance: Secondary | ICD-10-CM | POA: Diagnosis not present

## 2017-05-28 DIAGNOSIS — F329 Major depressive disorder, single episode, unspecified: Secondary | ICD-10-CM | POA: Diagnosis not present

## 2017-05-28 DIAGNOSIS — M1991 Primary osteoarthritis, unspecified site: Secondary | ICD-10-CM | POA: Diagnosis not present

## 2017-05-29 DIAGNOSIS — F419 Anxiety disorder, unspecified: Secondary | ICD-10-CM | POA: Diagnosis not present

## 2017-05-29 DIAGNOSIS — M1991 Primary osteoarthritis, unspecified site: Secondary | ICD-10-CM | POA: Diagnosis not present

## 2017-05-29 DIAGNOSIS — F329 Major depressive disorder, single episode, unspecified: Secondary | ICD-10-CM | POA: Diagnosis not present

## 2017-05-29 DIAGNOSIS — I2510 Atherosclerotic heart disease of native coronary artery without angina pectoris: Secondary | ICD-10-CM | POA: Diagnosis not present

## 2017-05-29 DIAGNOSIS — F0390 Unspecified dementia without behavioral disturbance: Secondary | ICD-10-CM | POA: Diagnosis not present

## 2017-05-29 DIAGNOSIS — I4891 Unspecified atrial fibrillation: Secondary | ICD-10-CM | POA: Diagnosis not present

## 2017-06-02 DIAGNOSIS — F0390 Unspecified dementia without behavioral disturbance: Secondary | ICD-10-CM | POA: Diagnosis not present

## 2017-06-02 DIAGNOSIS — I2510 Atherosclerotic heart disease of native coronary artery without angina pectoris: Secondary | ICD-10-CM | POA: Diagnosis not present

## 2017-06-02 DIAGNOSIS — I4891 Unspecified atrial fibrillation: Secondary | ICD-10-CM | POA: Diagnosis not present

## 2017-06-02 DIAGNOSIS — F419 Anxiety disorder, unspecified: Secondary | ICD-10-CM | POA: Diagnosis not present

## 2017-06-02 DIAGNOSIS — F329 Major depressive disorder, single episode, unspecified: Secondary | ICD-10-CM | POA: Diagnosis not present

## 2017-06-02 DIAGNOSIS — M1991 Primary osteoarthritis, unspecified site: Secondary | ICD-10-CM | POA: Diagnosis not present

## 2017-06-04 DIAGNOSIS — F419 Anxiety disorder, unspecified: Secondary | ICD-10-CM | POA: Diagnosis not present

## 2017-06-04 DIAGNOSIS — I4891 Unspecified atrial fibrillation: Secondary | ICD-10-CM | POA: Diagnosis not present

## 2017-06-04 DIAGNOSIS — F329 Major depressive disorder, single episode, unspecified: Secondary | ICD-10-CM | POA: Diagnosis not present

## 2017-06-04 DIAGNOSIS — I2510 Atherosclerotic heart disease of native coronary artery without angina pectoris: Secondary | ICD-10-CM | POA: Diagnosis not present

## 2017-06-04 DIAGNOSIS — M1991 Primary osteoarthritis, unspecified site: Secondary | ICD-10-CM | POA: Diagnosis not present

## 2017-06-04 DIAGNOSIS — F0390 Unspecified dementia without behavioral disturbance: Secondary | ICD-10-CM | POA: Diagnosis not present

## 2017-06-05 DIAGNOSIS — F329 Major depressive disorder, single episode, unspecified: Secondary | ICD-10-CM | POA: Diagnosis not present

## 2017-06-05 DIAGNOSIS — F419 Anxiety disorder, unspecified: Secondary | ICD-10-CM | POA: Diagnosis not present

## 2017-06-05 DIAGNOSIS — M1991 Primary osteoarthritis, unspecified site: Secondary | ICD-10-CM | POA: Diagnosis not present

## 2017-06-05 DIAGNOSIS — F0390 Unspecified dementia without behavioral disturbance: Secondary | ICD-10-CM | POA: Diagnosis not present

## 2017-06-05 DIAGNOSIS — I4891 Unspecified atrial fibrillation: Secondary | ICD-10-CM | POA: Diagnosis not present

## 2017-06-05 DIAGNOSIS — I2510 Atherosclerotic heart disease of native coronary artery without angina pectoris: Secondary | ICD-10-CM | POA: Diagnosis not present

## 2017-06-09 DIAGNOSIS — F329 Major depressive disorder, single episode, unspecified: Secondary | ICD-10-CM | POA: Diagnosis not present

## 2017-06-09 DIAGNOSIS — N8111 Cystocele, midline: Secondary | ICD-10-CM | POA: Diagnosis not present

## 2017-06-09 DIAGNOSIS — I2510 Atherosclerotic heart disease of native coronary artery without angina pectoris: Secondary | ICD-10-CM | POA: Diagnosis not present

## 2017-06-09 DIAGNOSIS — M1991 Primary osteoarthritis, unspecified site: Secondary | ICD-10-CM | POA: Diagnosis not present

## 2017-06-09 DIAGNOSIS — R32 Unspecified urinary incontinence: Secondary | ICD-10-CM | POA: Diagnosis not present

## 2017-06-09 DIAGNOSIS — N993 Prolapse of vaginal vault after hysterectomy: Secondary | ICD-10-CM | POA: Diagnosis not present

## 2017-06-09 DIAGNOSIS — I4891 Unspecified atrial fibrillation: Secondary | ICD-10-CM | POA: Diagnosis not present

## 2017-06-09 DIAGNOSIS — F0390 Unspecified dementia without behavioral disturbance: Secondary | ICD-10-CM | POA: Diagnosis not present

## 2017-06-09 DIAGNOSIS — F419 Anxiety disorder, unspecified: Secondary | ICD-10-CM | POA: Diagnosis not present

## 2017-06-10 DIAGNOSIS — F419 Anxiety disorder, unspecified: Secondary | ICD-10-CM | POA: Diagnosis not present

## 2017-06-10 DIAGNOSIS — M1991 Primary osteoarthritis, unspecified site: Secondary | ICD-10-CM | POA: Diagnosis not present

## 2017-06-10 DIAGNOSIS — F329 Major depressive disorder, single episode, unspecified: Secondary | ICD-10-CM | POA: Diagnosis not present

## 2017-06-10 DIAGNOSIS — F0390 Unspecified dementia without behavioral disturbance: Secondary | ICD-10-CM | POA: Diagnosis not present

## 2017-06-10 DIAGNOSIS — I4891 Unspecified atrial fibrillation: Secondary | ICD-10-CM | POA: Diagnosis not present

## 2017-06-10 DIAGNOSIS — I2510 Atherosclerotic heart disease of native coronary artery without angina pectoris: Secondary | ICD-10-CM | POA: Diagnosis not present

## 2017-06-12 DIAGNOSIS — I2510 Atherosclerotic heart disease of native coronary artery without angina pectoris: Secondary | ICD-10-CM | POA: Diagnosis not present

## 2017-06-12 DIAGNOSIS — F419 Anxiety disorder, unspecified: Secondary | ICD-10-CM | POA: Diagnosis not present

## 2017-06-12 DIAGNOSIS — M1991 Primary osteoarthritis, unspecified site: Secondary | ICD-10-CM | POA: Diagnosis not present

## 2017-06-12 DIAGNOSIS — F0390 Unspecified dementia without behavioral disturbance: Secondary | ICD-10-CM | POA: Diagnosis not present

## 2017-06-12 DIAGNOSIS — F329 Major depressive disorder, single episode, unspecified: Secondary | ICD-10-CM | POA: Diagnosis not present

## 2017-06-12 DIAGNOSIS — I4891 Unspecified atrial fibrillation: Secondary | ICD-10-CM | POA: Diagnosis not present

## 2017-06-13 DIAGNOSIS — F329 Major depressive disorder, single episode, unspecified: Secondary | ICD-10-CM | POA: Diagnosis not present

## 2017-06-13 DIAGNOSIS — F419 Anxiety disorder, unspecified: Secondary | ICD-10-CM | POA: Diagnosis not present

## 2017-06-13 DIAGNOSIS — F0390 Unspecified dementia without behavioral disturbance: Secondary | ICD-10-CM | POA: Diagnosis not present

## 2017-06-13 DIAGNOSIS — I2510 Atherosclerotic heart disease of native coronary artery without angina pectoris: Secondary | ICD-10-CM | POA: Diagnosis not present

## 2017-06-13 DIAGNOSIS — M1991 Primary osteoarthritis, unspecified site: Secondary | ICD-10-CM | POA: Diagnosis not present

## 2017-06-13 DIAGNOSIS — I4891 Unspecified atrial fibrillation: Secondary | ICD-10-CM | POA: Diagnosis not present

## 2017-06-17 DIAGNOSIS — I2510 Atherosclerotic heart disease of native coronary artery without angina pectoris: Secondary | ICD-10-CM | POA: Diagnosis not present

## 2017-06-17 DIAGNOSIS — F0390 Unspecified dementia without behavioral disturbance: Secondary | ICD-10-CM | POA: Diagnosis not present

## 2017-06-17 DIAGNOSIS — I4891 Unspecified atrial fibrillation: Secondary | ICD-10-CM | POA: Diagnosis not present

## 2017-06-17 DIAGNOSIS — F329 Major depressive disorder, single episode, unspecified: Secondary | ICD-10-CM | POA: Diagnosis not present

## 2017-08-01 DIAGNOSIS — R002 Palpitations: Secondary | ICD-10-CM | POA: Diagnosis not present

## 2017-08-01 DIAGNOSIS — I252 Old myocardial infarction: Secondary | ICD-10-CM | POA: Diagnosis not present

## 2017-08-01 DIAGNOSIS — N390 Urinary tract infection, site not specified: Secondary | ICD-10-CM | POA: Diagnosis not present

## 2017-08-01 DIAGNOSIS — I4891 Unspecified atrial fibrillation: Secondary | ICD-10-CM | POA: Diagnosis not present

## 2017-08-01 DIAGNOSIS — R350 Frequency of micturition: Secondary | ICD-10-CM | POA: Diagnosis not present

## 2017-08-01 DIAGNOSIS — I2510 Atherosclerotic heart disease of native coronary artery without angina pectoris: Secondary | ICD-10-CM | POA: Diagnosis not present

## 2017-08-01 DIAGNOSIS — R300 Dysuria: Secondary | ICD-10-CM | POA: Diagnosis not present

## 2017-08-01 DIAGNOSIS — R000 Tachycardia, unspecified: Secondary | ICD-10-CM | POA: Diagnosis not present

## 2017-08-01 DIAGNOSIS — R32 Unspecified urinary incontinence: Secondary | ICD-10-CM | POA: Diagnosis not present

## 2017-08-04 DIAGNOSIS — F0390 Unspecified dementia without behavioral disturbance: Secondary | ICD-10-CM | POA: Diagnosis not present

## 2017-08-04 DIAGNOSIS — F329 Major depressive disorder, single episode, unspecified: Secondary | ICD-10-CM | POA: Diagnosis not present

## 2017-08-04 DIAGNOSIS — I2510 Atherosclerotic heart disease of native coronary artery without angina pectoris: Secondary | ICD-10-CM | POA: Diagnosis not present

## 2017-08-04 DIAGNOSIS — I4891 Unspecified atrial fibrillation: Secondary | ICD-10-CM | POA: Diagnosis not present

## 2017-08-04 DIAGNOSIS — R0789 Other chest pain: Secondary | ICD-10-CM | POA: Diagnosis not present

## 2017-08-13 DIAGNOSIS — R413 Other amnesia: Secondary | ICD-10-CM | POA: Diagnosis not present

## 2017-08-13 DIAGNOSIS — Z6825 Body mass index (BMI) 25.0-25.9, adult: Secondary | ICD-10-CM | POA: Diagnosis not present

## 2017-08-20 DIAGNOSIS — Z7901 Long term (current) use of anticoagulants: Secondary | ICD-10-CM | POA: Diagnosis not present

## 2017-08-20 DIAGNOSIS — Z789 Other specified health status: Secondary | ICD-10-CM | POA: Diagnosis not present

## 2017-08-20 DIAGNOSIS — I480 Paroxysmal atrial fibrillation: Secondary | ICD-10-CM | POA: Diagnosis not present

## 2017-08-20 DIAGNOSIS — Z6825 Body mass index (BMI) 25.0-25.9, adult: Secondary | ICD-10-CM | POA: Diagnosis not present

## 2017-08-20 DIAGNOSIS — R002 Palpitations: Secondary | ICD-10-CM | POA: Diagnosis not present

## 2017-08-20 DIAGNOSIS — Z79899 Other long term (current) drug therapy: Secondary | ICD-10-CM | POA: Diagnosis not present

## 2017-08-20 DIAGNOSIS — Z951 Presence of aortocoronary bypass graft: Secondary | ICD-10-CM | POA: Diagnosis not present

## 2017-08-20 DIAGNOSIS — R918 Other nonspecific abnormal finding of lung field: Secondary | ICD-10-CM | POA: Diagnosis not present

## 2017-08-20 DIAGNOSIS — E7800 Pure hypercholesterolemia, unspecified: Secondary | ICD-10-CM | POA: Diagnosis not present

## 2017-08-20 DIAGNOSIS — R0602 Shortness of breath: Secondary | ICD-10-CM | POA: Diagnosis not present

## 2017-08-20 DIAGNOSIS — I119 Hypertensive heart disease without heart failure: Secondary | ICD-10-CM | POA: Diagnosis not present

## 2017-08-20 DIAGNOSIS — J90 Pleural effusion, not elsewhere classified: Secondary | ICD-10-CM | POA: Diagnosis not present

## 2017-08-20 DIAGNOSIS — I2510 Atherosclerotic heart disease of native coronary artery without angina pectoris: Secondary | ICD-10-CM | POA: Diagnosis not present

## 2017-08-21 DIAGNOSIS — I2510 Atherosclerotic heart disease of native coronary artery without angina pectoris: Secondary | ICD-10-CM | POA: Diagnosis not present

## 2017-08-21 DIAGNOSIS — R0602 Shortness of breath: Secondary | ICD-10-CM | POA: Diagnosis not present

## 2017-08-28 DIAGNOSIS — R0602 Shortness of breath: Secondary | ICD-10-CM | POA: Diagnosis not present

## 2017-09-03 DIAGNOSIS — D649 Anemia, unspecified: Secondary | ICD-10-CM | POA: Diagnosis not present

## 2017-09-03 DIAGNOSIS — E7800 Pure hypercholesterolemia, unspecified: Secondary | ICD-10-CM | POA: Diagnosis not present

## 2017-09-03 DIAGNOSIS — Z79899 Other long term (current) drug therapy: Secondary | ICD-10-CM | POA: Diagnosis not present

## 2017-09-03 DIAGNOSIS — R5383 Other fatigue: Secondary | ICD-10-CM | POA: Diagnosis not present

## 2017-09-03 DIAGNOSIS — I482 Chronic atrial fibrillation: Secondary | ICD-10-CM | POA: Diagnosis not present

## 2017-09-03 DIAGNOSIS — Z6824 Body mass index (BMI) 24.0-24.9, adult: Secondary | ICD-10-CM | POA: Diagnosis not present

## 2017-09-03 DIAGNOSIS — R002 Palpitations: Secondary | ICD-10-CM | POA: Diagnosis not present

## 2017-09-03 DIAGNOSIS — R233 Spontaneous ecchymoses: Secondary | ICD-10-CM | POA: Diagnosis not present

## 2017-09-03 DIAGNOSIS — Z951 Presence of aortocoronary bypass graft: Secondary | ICD-10-CM | POA: Diagnosis not present

## 2017-09-03 DIAGNOSIS — I119 Hypertensive heart disease without heart failure: Secondary | ICD-10-CM | POA: Diagnosis not present

## 2017-09-03 DIAGNOSIS — Z789 Other specified health status: Secondary | ICD-10-CM | POA: Diagnosis not present

## 2017-09-03 DIAGNOSIS — Z7901 Long term (current) use of anticoagulants: Secondary | ICD-10-CM | POA: Diagnosis not present

## 2017-09-03 DIAGNOSIS — R0602 Shortness of breath: Secondary | ICD-10-CM | POA: Diagnosis not present

## 2017-09-03 DIAGNOSIS — I2510 Atherosclerotic heart disease of native coronary artery without angina pectoris: Secondary | ICD-10-CM | POA: Diagnosis not present

## 2017-09-06 ENCOUNTER — Other Ambulatory Visit: Payer: Self-pay | Admitting: Adult Health

## 2017-09-10 DIAGNOSIS — I482 Chronic atrial fibrillation: Secondary | ICD-10-CM | POA: Diagnosis not present

## 2017-09-16 DIAGNOSIS — Z8673 Personal history of transient ischemic attack (TIA), and cerebral infarction without residual deficits: Secondary | ICD-10-CM | POA: Diagnosis not present

## 2017-09-16 DIAGNOSIS — I4891 Unspecified atrial fibrillation: Secondary | ICD-10-CM | POA: Diagnosis not present

## 2017-09-16 DIAGNOSIS — I2510 Atherosclerotic heart disease of native coronary artery without angina pectoris: Secondary | ICD-10-CM | POA: Diagnosis not present

## 2017-09-16 DIAGNOSIS — I252 Old myocardial infarction: Secondary | ICD-10-CM | POA: Diagnosis not present

## 2017-09-29 DIAGNOSIS — Z789 Other specified health status: Secondary | ICD-10-CM | POA: Diagnosis not present

## 2017-09-29 DIAGNOSIS — Z7901 Long term (current) use of anticoagulants: Secondary | ICD-10-CM | POA: Diagnosis not present

## 2017-09-29 DIAGNOSIS — I2510 Atherosclerotic heart disease of native coronary artery without angina pectoris: Secondary | ICD-10-CM | POA: Diagnosis not present

## 2017-09-29 DIAGNOSIS — I119 Hypertensive heart disease without heart failure: Secondary | ICD-10-CM | POA: Diagnosis not present

## 2017-09-29 DIAGNOSIS — Z6824 Body mass index (BMI) 24.0-24.9, adult: Secondary | ICD-10-CM | POA: Diagnosis not present

## 2017-09-29 DIAGNOSIS — I480 Paroxysmal atrial fibrillation: Secondary | ICD-10-CM | POA: Diagnosis not present

## 2017-09-29 DIAGNOSIS — E7800 Pure hypercholesterolemia, unspecified: Secondary | ICD-10-CM | POA: Diagnosis not present

## 2017-09-29 DIAGNOSIS — Z951 Presence of aortocoronary bypass graft: Secondary | ICD-10-CM | POA: Diagnosis not present

## 2017-09-29 DIAGNOSIS — I361 Nonrheumatic tricuspid (valve) insufficiency: Secondary | ICD-10-CM | POA: Diagnosis not present

## 2017-09-29 DIAGNOSIS — Z79899 Other long term (current) drug therapy: Secondary | ICD-10-CM | POA: Diagnosis not present

## 2017-10-23 DIAGNOSIS — I4891 Unspecified atrial fibrillation: Secondary | ICD-10-CM | POA: Diagnosis not present

## 2017-10-23 DIAGNOSIS — G4700 Insomnia, unspecified: Secondary | ICD-10-CM | POA: Diagnosis not present

## 2017-10-23 DIAGNOSIS — F0390 Unspecified dementia without behavioral disturbance: Secondary | ICD-10-CM | POA: Diagnosis not present

## 2017-10-23 DIAGNOSIS — F329 Major depressive disorder, single episode, unspecified: Secondary | ICD-10-CM | POA: Diagnosis not present

## 2017-11-03 DIAGNOSIS — I119 Hypertensive heart disease without heart failure: Secondary | ICD-10-CM | POA: Diagnosis not present

## 2017-11-19 DIAGNOSIS — I2510 Atherosclerotic heart disease of native coronary artery without angina pectoris: Secondary | ICD-10-CM | POA: Diagnosis not present

## 2017-11-19 DIAGNOSIS — R300 Dysuria: Secondary | ICD-10-CM | POA: Diagnosis not present

## 2017-11-19 DIAGNOSIS — I4891 Unspecified atrial fibrillation: Secondary | ICD-10-CM | POA: Diagnosis not present

## 2017-11-19 DIAGNOSIS — F0390 Unspecified dementia without behavioral disturbance: Secondary | ICD-10-CM | POA: Diagnosis not present

## 2017-11-19 DIAGNOSIS — F329 Major depressive disorder, single episode, unspecified: Secondary | ICD-10-CM | POA: Diagnosis not present

## 2017-11-19 DIAGNOSIS — N390 Urinary tract infection, site not specified: Secondary | ICD-10-CM | POA: Diagnosis not present

## 2017-11-25 DIAGNOSIS — G43909 Migraine, unspecified, not intractable, without status migrainosus: Secondary | ICD-10-CM | POA: Diagnosis not present

## 2017-11-25 DIAGNOSIS — N390 Urinary tract infection, site not specified: Secondary | ICD-10-CM | POA: Diagnosis not present

## 2017-11-25 DIAGNOSIS — F329 Major depressive disorder, single episode, unspecified: Secondary | ICD-10-CM | POA: Diagnosis not present

## 2017-11-25 DIAGNOSIS — Z6825 Body mass index (BMI) 25.0-25.9, adult: Secondary | ICD-10-CM | POA: Diagnosis not present

## 2018-01-09 DIAGNOSIS — R509 Fever, unspecified: Secondary | ICD-10-CM | POA: Diagnosis not present

## 2018-01-09 DIAGNOSIS — Z8249 Family history of ischemic heart disease and other diseases of the circulatory system: Secondary | ICD-10-CM | POA: Diagnosis not present

## 2018-01-09 DIAGNOSIS — J181 Lobar pneumonia, unspecified organism: Secondary | ICD-10-CM | POA: Diagnosis present

## 2018-01-09 DIAGNOSIS — I482 Chronic atrial fibrillation: Secondary | ICD-10-CM | POA: Diagnosis present

## 2018-01-09 DIAGNOSIS — G309 Alzheimer's disease, unspecified: Secondary | ICD-10-CM | POA: Diagnosis present

## 2018-01-09 DIAGNOSIS — R0902 Hypoxemia: Secondary | ICD-10-CM | POA: Diagnosis not present

## 2018-01-09 DIAGNOSIS — Z955 Presence of coronary angioplasty implant and graft: Secondary | ICD-10-CM | POA: Diagnosis not present

## 2018-01-09 DIAGNOSIS — G459 Transient cerebral ischemic attack, unspecified: Secondary | ICD-10-CM | POA: Diagnosis not present

## 2018-01-09 DIAGNOSIS — I10 Essential (primary) hypertension: Secondary | ICD-10-CM | POA: Diagnosis not present

## 2018-01-09 DIAGNOSIS — I2510 Atherosclerotic heart disease of native coronary artery without angina pectoris: Secondary | ICD-10-CM | POA: Diagnosis present

## 2018-01-09 DIAGNOSIS — I69318 Other symptoms and signs involving cognitive functions following cerebral infarction: Secondary | ICD-10-CM | POA: Diagnosis not present

## 2018-01-09 DIAGNOSIS — R0602 Shortness of breath: Secondary | ICD-10-CM | POA: Diagnosis not present

## 2018-01-09 DIAGNOSIS — R531 Weakness: Secondary | ICD-10-CM | POA: Diagnosis not present

## 2018-01-09 DIAGNOSIS — K219 Gastro-esophageal reflux disease without esophagitis: Secondary | ICD-10-CM | POA: Diagnosis present

## 2018-01-09 DIAGNOSIS — G4700 Insomnia, unspecified: Secondary | ICD-10-CM | POA: Diagnosis present

## 2018-01-09 DIAGNOSIS — R0789 Other chest pain: Secondary | ICD-10-CM | POA: Diagnosis not present

## 2018-01-09 DIAGNOSIS — R05 Cough: Secondary | ICD-10-CM | POA: Diagnosis not present

## 2018-01-09 DIAGNOSIS — N3281 Overactive bladder: Secondary | ICD-10-CM | POA: Diagnosis present

## 2018-01-09 DIAGNOSIS — G250 Essential tremor: Secondary | ICD-10-CM | POA: Diagnosis present

## 2018-01-09 DIAGNOSIS — R079 Chest pain, unspecified: Secondary | ICD-10-CM | POA: Diagnosis not present

## 2018-01-09 DIAGNOSIS — Z951 Presence of aortocoronary bypass graft: Secondary | ICD-10-CM | POA: Diagnosis not present

## 2018-01-09 DIAGNOSIS — R42 Dizziness and giddiness: Secondary | ICD-10-CM | POA: Diagnosis not present

## 2018-01-09 DIAGNOSIS — J189 Pneumonia, unspecified organism: Secondary | ICD-10-CM | POA: Diagnosis not present

## 2018-01-09 DIAGNOSIS — F0280 Dementia in other diseases classified elsewhere without behavioral disturbance: Secondary | ICD-10-CM | POA: Diagnosis present

## 2018-01-09 DIAGNOSIS — E785 Hyperlipidemia, unspecified: Secondary | ICD-10-CM | POA: Diagnosis not present

## 2018-01-09 DIAGNOSIS — I4891 Unspecified atrial fibrillation: Secondary | ICD-10-CM | POA: Diagnosis not present

## 2018-01-09 DIAGNOSIS — I252 Old myocardial infarction: Secondary | ICD-10-CM | POA: Diagnosis not present

## 2018-01-21 DIAGNOSIS — Z23 Encounter for immunization: Secondary | ICD-10-CM | POA: Diagnosis not present

## 2018-01-21 DIAGNOSIS — R300 Dysuria: Secondary | ICD-10-CM | POA: Diagnosis not present

## 2018-01-21 DIAGNOSIS — J189 Pneumonia, unspecified organism: Secondary | ICD-10-CM | POA: Diagnosis not present

## 2018-01-21 DIAGNOSIS — Z6825 Body mass index (BMI) 25.0-25.9, adult: Secondary | ICD-10-CM | POA: Diagnosis not present

## 2018-01-27 DIAGNOSIS — J189 Pneumonia, unspecified organism: Secondary | ICD-10-CM | POA: Diagnosis not present

## 2018-01-27 DIAGNOSIS — F419 Anxiety disorder, unspecified: Secondary | ICD-10-CM | POA: Diagnosis not present

## 2018-01-27 DIAGNOSIS — F0390 Unspecified dementia without behavioral disturbance: Secondary | ICD-10-CM | POA: Diagnosis not present

## 2018-01-27 DIAGNOSIS — F329 Major depressive disorder, single episode, unspecified: Secondary | ICD-10-CM | POA: Diagnosis not present

## 2018-01-27 DIAGNOSIS — I2510 Atherosclerotic heart disease of native coronary artery without angina pectoris: Secondary | ICD-10-CM | POA: Diagnosis not present

## 2018-01-27 DIAGNOSIS — I4891 Unspecified atrial fibrillation: Secondary | ICD-10-CM | POA: Diagnosis not present

## 2018-01-28 DIAGNOSIS — F329 Major depressive disorder, single episode, unspecified: Secondary | ICD-10-CM | POA: Diagnosis not present

## 2018-01-28 DIAGNOSIS — F419 Anxiety disorder, unspecified: Secondary | ICD-10-CM | POA: Diagnosis not present

## 2018-01-28 DIAGNOSIS — J189 Pneumonia, unspecified organism: Secondary | ICD-10-CM | POA: Diagnosis not present

## 2018-01-28 DIAGNOSIS — F0390 Unspecified dementia without behavioral disturbance: Secondary | ICD-10-CM | POA: Diagnosis not present

## 2018-01-28 DIAGNOSIS — I2510 Atherosclerotic heart disease of native coronary artery without angina pectoris: Secondary | ICD-10-CM | POA: Diagnosis not present

## 2018-01-28 DIAGNOSIS — I4891 Unspecified atrial fibrillation: Secondary | ICD-10-CM | POA: Diagnosis not present

## 2018-01-29 DIAGNOSIS — J189 Pneumonia, unspecified organism: Secondary | ICD-10-CM | POA: Diagnosis not present

## 2018-01-29 DIAGNOSIS — I4891 Unspecified atrial fibrillation: Secondary | ICD-10-CM | POA: Diagnosis not present

## 2018-01-29 DIAGNOSIS — F0390 Unspecified dementia without behavioral disturbance: Secondary | ICD-10-CM | POA: Diagnosis not present

## 2018-01-29 DIAGNOSIS — F329 Major depressive disorder, single episode, unspecified: Secondary | ICD-10-CM | POA: Diagnosis not present

## 2018-01-29 DIAGNOSIS — F419 Anxiety disorder, unspecified: Secondary | ICD-10-CM | POA: Diagnosis not present

## 2018-01-29 DIAGNOSIS — I2510 Atherosclerotic heart disease of native coronary artery without angina pectoris: Secondary | ICD-10-CM | POA: Diagnosis not present

## 2018-01-30 DIAGNOSIS — I4891 Unspecified atrial fibrillation: Secondary | ICD-10-CM | POA: Diagnosis not present

## 2018-01-30 DIAGNOSIS — F419 Anxiety disorder, unspecified: Secondary | ICD-10-CM | POA: Diagnosis not present

## 2018-01-30 DIAGNOSIS — J189 Pneumonia, unspecified organism: Secondary | ICD-10-CM | POA: Diagnosis not present

## 2018-01-30 DIAGNOSIS — F329 Major depressive disorder, single episode, unspecified: Secondary | ICD-10-CM | POA: Diagnosis not present

## 2018-01-30 DIAGNOSIS — I2510 Atherosclerotic heart disease of native coronary artery without angina pectoris: Secondary | ICD-10-CM | POA: Diagnosis not present

## 2018-01-30 DIAGNOSIS — F0390 Unspecified dementia without behavioral disturbance: Secondary | ICD-10-CM | POA: Diagnosis not present

## 2018-02-02 DIAGNOSIS — F329 Major depressive disorder, single episode, unspecified: Secondary | ICD-10-CM | POA: Diagnosis not present

## 2018-02-02 DIAGNOSIS — J189 Pneumonia, unspecified organism: Secondary | ICD-10-CM | POA: Diagnosis not present

## 2018-02-02 DIAGNOSIS — F0390 Unspecified dementia without behavioral disturbance: Secondary | ICD-10-CM | POA: Diagnosis not present

## 2018-02-02 DIAGNOSIS — I2510 Atherosclerotic heart disease of native coronary artery without angina pectoris: Secondary | ICD-10-CM | POA: Diagnosis not present

## 2018-02-02 DIAGNOSIS — I4891 Unspecified atrial fibrillation: Secondary | ICD-10-CM | POA: Diagnosis not present

## 2018-02-02 DIAGNOSIS — F419 Anxiety disorder, unspecified: Secondary | ICD-10-CM | POA: Diagnosis not present

## 2018-02-03 DIAGNOSIS — F419 Anxiety disorder, unspecified: Secondary | ICD-10-CM | POA: Diagnosis not present

## 2018-02-03 DIAGNOSIS — F0390 Unspecified dementia without behavioral disturbance: Secondary | ICD-10-CM | POA: Diagnosis not present

## 2018-02-03 DIAGNOSIS — F329 Major depressive disorder, single episode, unspecified: Secondary | ICD-10-CM | POA: Diagnosis not present

## 2018-02-03 DIAGNOSIS — I2510 Atherosclerotic heart disease of native coronary artery without angina pectoris: Secondary | ICD-10-CM | POA: Diagnosis not present

## 2018-02-03 DIAGNOSIS — J189 Pneumonia, unspecified organism: Secondary | ICD-10-CM | POA: Diagnosis not present

## 2018-02-03 DIAGNOSIS — I4891 Unspecified atrial fibrillation: Secondary | ICD-10-CM | POA: Diagnosis not present

## 2018-02-05 DIAGNOSIS — F0390 Unspecified dementia without behavioral disturbance: Secondary | ICD-10-CM | POA: Diagnosis not present

## 2018-02-05 DIAGNOSIS — F329 Major depressive disorder, single episode, unspecified: Secondary | ICD-10-CM | POA: Diagnosis not present

## 2018-02-05 DIAGNOSIS — I4891 Unspecified atrial fibrillation: Secondary | ICD-10-CM | POA: Diagnosis not present

## 2018-02-05 DIAGNOSIS — F419 Anxiety disorder, unspecified: Secondary | ICD-10-CM | POA: Diagnosis not present

## 2018-02-05 DIAGNOSIS — I2510 Atherosclerotic heart disease of native coronary artery without angina pectoris: Secondary | ICD-10-CM | POA: Diagnosis not present

## 2018-02-05 DIAGNOSIS — J189 Pneumonia, unspecified organism: Secondary | ICD-10-CM | POA: Diagnosis not present

## 2018-02-06 DIAGNOSIS — I2510 Atherosclerotic heart disease of native coronary artery without angina pectoris: Secondary | ICD-10-CM | POA: Diagnosis not present

## 2018-02-06 DIAGNOSIS — F329 Major depressive disorder, single episode, unspecified: Secondary | ICD-10-CM | POA: Diagnosis not present

## 2018-02-06 DIAGNOSIS — J189 Pneumonia, unspecified organism: Secondary | ICD-10-CM | POA: Diagnosis not present

## 2018-02-06 DIAGNOSIS — I4891 Unspecified atrial fibrillation: Secondary | ICD-10-CM | POA: Diagnosis not present

## 2018-02-06 DIAGNOSIS — F419 Anxiety disorder, unspecified: Secondary | ICD-10-CM | POA: Diagnosis not present

## 2018-02-06 DIAGNOSIS — F0390 Unspecified dementia without behavioral disturbance: Secondary | ICD-10-CM | POA: Diagnosis not present

## 2018-02-09 DIAGNOSIS — I2510 Atherosclerotic heart disease of native coronary artery without angina pectoris: Secondary | ICD-10-CM | POA: Diagnosis not present

## 2018-02-09 DIAGNOSIS — J189 Pneumonia, unspecified organism: Secondary | ICD-10-CM | POA: Diagnosis not present

## 2018-02-09 DIAGNOSIS — F0390 Unspecified dementia without behavioral disturbance: Secondary | ICD-10-CM | POA: Diagnosis not present

## 2018-02-09 DIAGNOSIS — F419 Anxiety disorder, unspecified: Secondary | ICD-10-CM | POA: Diagnosis not present

## 2018-02-09 DIAGNOSIS — E7800 Pure hypercholesterolemia, unspecified: Secondary | ICD-10-CM | POA: Diagnosis not present

## 2018-02-09 DIAGNOSIS — I4891 Unspecified atrial fibrillation: Secondary | ICD-10-CM | POA: Diagnosis not present

## 2018-02-09 DIAGNOSIS — F329 Major depressive disorder, single episode, unspecified: Secondary | ICD-10-CM | POA: Diagnosis not present

## 2018-02-09 DIAGNOSIS — Z8673 Personal history of transient ischemic attack (TIA), and cerebral infarction without residual deficits: Secondary | ICD-10-CM | POA: Diagnosis not present

## 2018-02-10 DIAGNOSIS — I4891 Unspecified atrial fibrillation: Secondary | ICD-10-CM | POA: Diagnosis not present

## 2018-02-10 DIAGNOSIS — F0390 Unspecified dementia without behavioral disturbance: Secondary | ICD-10-CM | POA: Diagnosis not present

## 2018-02-10 DIAGNOSIS — J189 Pneumonia, unspecified organism: Secondary | ICD-10-CM | POA: Diagnosis not present

## 2018-02-10 DIAGNOSIS — F329 Major depressive disorder, single episode, unspecified: Secondary | ICD-10-CM | POA: Diagnosis not present

## 2018-02-10 DIAGNOSIS — I2510 Atherosclerotic heart disease of native coronary artery without angina pectoris: Secondary | ICD-10-CM | POA: Diagnosis not present

## 2018-02-10 DIAGNOSIS — F419 Anxiety disorder, unspecified: Secondary | ICD-10-CM | POA: Diagnosis not present

## 2018-02-11 DIAGNOSIS — E7800 Pure hypercholesterolemia, unspecified: Secondary | ICD-10-CM | POA: Diagnosis not present

## 2018-02-11 DIAGNOSIS — Z951 Presence of aortocoronary bypass graft: Secondary | ICD-10-CM | POA: Diagnosis not present

## 2018-02-11 DIAGNOSIS — Z6825 Body mass index (BMI) 25.0-25.9, adult: Secondary | ICD-10-CM | POA: Diagnosis not present

## 2018-02-11 DIAGNOSIS — Z7901 Long term (current) use of anticoagulants: Secondary | ICD-10-CM | POA: Diagnosis not present

## 2018-02-11 DIAGNOSIS — Z79899 Other long term (current) drug therapy: Secondary | ICD-10-CM | POA: Diagnosis not present

## 2018-02-11 DIAGNOSIS — I4821 Permanent atrial fibrillation: Secondary | ICD-10-CM | POA: Diagnosis not present

## 2018-02-11 DIAGNOSIS — Z789 Other specified health status: Secondary | ICD-10-CM | POA: Diagnosis not present

## 2018-02-11 DIAGNOSIS — I119 Hypertensive heart disease without heart failure: Secondary | ICD-10-CM | POA: Diagnosis not present

## 2018-02-11 DIAGNOSIS — I2510 Atherosclerotic heart disease of native coronary artery without angina pectoris: Secondary | ICD-10-CM | POA: Diagnosis not present

## 2018-02-12 DIAGNOSIS — I4891 Unspecified atrial fibrillation: Secondary | ICD-10-CM | POA: Diagnosis not present

## 2018-02-12 DIAGNOSIS — F419 Anxiety disorder, unspecified: Secondary | ICD-10-CM | POA: Diagnosis not present

## 2018-02-12 DIAGNOSIS — F0390 Unspecified dementia without behavioral disturbance: Secondary | ICD-10-CM | POA: Diagnosis not present

## 2018-02-12 DIAGNOSIS — I2510 Atherosclerotic heart disease of native coronary artery without angina pectoris: Secondary | ICD-10-CM | POA: Diagnosis not present

## 2018-02-12 DIAGNOSIS — J189 Pneumonia, unspecified organism: Secondary | ICD-10-CM | POA: Diagnosis not present

## 2018-02-12 DIAGNOSIS — F329 Major depressive disorder, single episode, unspecified: Secondary | ICD-10-CM | POA: Diagnosis not present

## 2018-02-16 DIAGNOSIS — I2510 Atherosclerotic heart disease of native coronary artery without angina pectoris: Secondary | ICD-10-CM | POA: Diagnosis not present

## 2018-02-16 DIAGNOSIS — F419 Anxiety disorder, unspecified: Secondary | ICD-10-CM | POA: Diagnosis not present

## 2018-02-16 DIAGNOSIS — F329 Major depressive disorder, single episode, unspecified: Secondary | ICD-10-CM | POA: Diagnosis not present

## 2018-02-16 DIAGNOSIS — F0390 Unspecified dementia without behavioral disturbance: Secondary | ICD-10-CM | POA: Diagnosis not present

## 2018-02-16 DIAGNOSIS — J189 Pneumonia, unspecified organism: Secondary | ICD-10-CM | POA: Diagnosis not present

## 2018-02-16 DIAGNOSIS — I4891 Unspecified atrial fibrillation: Secondary | ICD-10-CM | POA: Diagnosis not present

## 2018-02-17 DIAGNOSIS — I4891 Unspecified atrial fibrillation: Secondary | ICD-10-CM | POA: Diagnosis not present

## 2018-02-17 DIAGNOSIS — J189 Pneumonia, unspecified organism: Secondary | ICD-10-CM | POA: Diagnosis not present

## 2018-02-17 DIAGNOSIS — F329 Major depressive disorder, single episode, unspecified: Secondary | ICD-10-CM | POA: Diagnosis not present

## 2018-02-17 DIAGNOSIS — F0390 Unspecified dementia without behavioral disturbance: Secondary | ICD-10-CM | POA: Diagnosis not present

## 2018-02-17 DIAGNOSIS — I2510 Atherosclerotic heart disease of native coronary artery without angina pectoris: Secondary | ICD-10-CM | POA: Diagnosis not present

## 2018-02-17 DIAGNOSIS — F419 Anxiety disorder, unspecified: Secondary | ICD-10-CM | POA: Diagnosis not present

## 2018-02-18 DIAGNOSIS — I2510 Atherosclerotic heart disease of native coronary artery without angina pectoris: Secondary | ICD-10-CM | POA: Diagnosis not present

## 2018-02-18 DIAGNOSIS — J189 Pneumonia, unspecified organism: Secondary | ICD-10-CM | POA: Diagnosis not present

## 2018-02-18 DIAGNOSIS — I4891 Unspecified atrial fibrillation: Secondary | ICD-10-CM | POA: Diagnosis not present

## 2018-02-18 DIAGNOSIS — F0390 Unspecified dementia without behavioral disturbance: Secondary | ICD-10-CM | POA: Diagnosis not present

## 2018-02-18 DIAGNOSIS — F419 Anxiety disorder, unspecified: Secondary | ICD-10-CM | POA: Diagnosis not present

## 2018-02-18 DIAGNOSIS — F329 Major depressive disorder, single episode, unspecified: Secondary | ICD-10-CM | POA: Diagnosis not present

## 2018-02-19 DIAGNOSIS — F419 Anxiety disorder, unspecified: Secondary | ICD-10-CM | POA: Diagnosis not present

## 2018-02-19 DIAGNOSIS — I2510 Atherosclerotic heart disease of native coronary artery without angina pectoris: Secondary | ICD-10-CM | POA: Diagnosis not present

## 2018-02-19 DIAGNOSIS — F0390 Unspecified dementia without behavioral disturbance: Secondary | ICD-10-CM | POA: Diagnosis not present

## 2018-02-19 DIAGNOSIS — I4891 Unspecified atrial fibrillation: Secondary | ICD-10-CM | POA: Diagnosis not present

## 2018-02-19 DIAGNOSIS — J189 Pneumonia, unspecified organism: Secondary | ICD-10-CM | POA: Diagnosis not present

## 2018-02-19 DIAGNOSIS — F329 Major depressive disorder, single episode, unspecified: Secondary | ICD-10-CM | POA: Diagnosis not present

## 2018-02-23 DIAGNOSIS — I2510 Atherosclerotic heart disease of native coronary artery without angina pectoris: Secondary | ICD-10-CM | POA: Diagnosis not present

## 2018-02-23 DIAGNOSIS — J189 Pneumonia, unspecified organism: Secondary | ICD-10-CM | POA: Diagnosis not present

## 2018-02-23 DIAGNOSIS — F419 Anxiety disorder, unspecified: Secondary | ICD-10-CM | POA: Diagnosis not present

## 2018-02-23 DIAGNOSIS — I4891 Unspecified atrial fibrillation: Secondary | ICD-10-CM | POA: Diagnosis not present

## 2018-02-23 DIAGNOSIS — F0390 Unspecified dementia without behavioral disturbance: Secondary | ICD-10-CM | POA: Diagnosis not present

## 2018-02-23 DIAGNOSIS — F329 Major depressive disorder, single episode, unspecified: Secondary | ICD-10-CM | POA: Diagnosis not present

## 2018-02-24 DIAGNOSIS — F419 Anxiety disorder, unspecified: Secondary | ICD-10-CM | POA: Diagnosis not present

## 2018-02-24 DIAGNOSIS — J189 Pneumonia, unspecified organism: Secondary | ICD-10-CM | POA: Diagnosis not present

## 2018-02-24 DIAGNOSIS — I2510 Atherosclerotic heart disease of native coronary artery without angina pectoris: Secondary | ICD-10-CM | POA: Diagnosis not present

## 2018-02-24 DIAGNOSIS — I4891 Unspecified atrial fibrillation: Secondary | ICD-10-CM | POA: Diagnosis not present

## 2018-02-24 DIAGNOSIS — F329 Major depressive disorder, single episode, unspecified: Secondary | ICD-10-CM | POA: Diagnosis not present

## 2018-02-24 DIAGNOSIS — F0390 Unspecified dementia without behavioral disturbance: Secondary | ICD-10-CM | POA: Diagnosis not present

## 2018-02-25 DIAGNOSIS — F329 Major depressive disorder, single episode, unspecified: Secondary | ICD-10-CM | POA: Diagnosis not present

## 2018-02-25 DIAGNOSIS — K219 Gastro-esophageal reflux disease without esophagitis: Secondary | ICD-10-CM | POA: Diagnosis not present

## 2018-02-25 DIAGNOSIS — I4891 Unspecified atrial fibrillation: Secondary | ICD-10-CM | POA: Diagnosis not present

## 2018-02-25 DIAGNOSIS — I2510 Atherosclerotic heart disease of native coronary artery without angina pectoris: Secondary | ICD-10-CM | POA: Diagnosis not present

## 2018-02-25 DIAGNOSIS — J189 Pneumonia, unspecified organism: Secondary | ICD-10-CM | POA: Diagnosis not present

## 2018-02-25 DIAGNOSIS — F419 Anxiety disorder, unspecified: Secondary | ICD-10-CM | POA: Diagnosis not present

## 2018-02-25 DIAGNOSIS — Z6825 Body mass index (BMI) 25.0-25.9, adult: Secondary | ICD-10-CM | POA: Diagnosis not present

## 2018-02-25 DIAGNOSIS — F0390 Unspecified dementia without behavioral disturbance: Secondary | ICD-10-CM | POA: Diagnosis not present

## 2018-02-26 DIAGNOSIS — I4891 Unspecified atrial fibrillation: Secondary | ICD-10-CM | POA: Diagnosis not present

## 2018-02-26 DIAGNOSIS — F0390 Unspecified dementia without behavioral disturbance: Secondary | ICD-10-CM | POA: Diagnosis not present

## 2018-02-26 DIAGNOSIS — I2510 Atherosclerotic heart disease of native coronary artery without angina pectoris: Secondary | ICD-10-CM | POA: Diagnosis not present

## 2018-02-26 DIAGNOSIS — J189 Pneumonia, unspecified organism: Secondary | ICD-10-CM | POA: Diagnosis not present

## 2018-02-26 DIAGNOSIS — F419 Anxiety disorder, unspecified: Secondary | ICD-10-CM | POA: Diagnosis not present

## 2018-02-26 DIAGNOSIS — F329 Major depressive disorder, single episode, unspecified: Secondary | ICD-10-CM | POA: Diagnosis not present

## 2018-03-02 DIAGNOSIS — F0390 Unspecified dementia without behavioral disturbance: Secondary | ICD-10-CM | POA: Diagnosis not present

## 2018-03-02 DIAGNOSIS — F329 Major depressive disorder, single episode, unspecified: Secondary | ICD-10-CM | POA: Diagnosis not present

## 2018-03-02 DIAGNOSIS — F419 Anxiety disorder, unspecified: Secondary | ICD-10-CM | POA: Diagnosis not present

## 2018-03-02 DIAGNOSIS — I4891 Unspecified atrial fibrillation: Secondary | ICD-10-CM | POA: Diagnosis not present

## 2018-03-02 DIAGNOSIS — I2510 Atherosclerotic heart disease of native coronary artery without angina pectoris: Secondary | ICD-10-CM | POA: Diagnosis not present

## 2018-03-02 DIAGNOSIS — J189 Pneumonia, unspecified organism: Secondary | ICD-10-CM | POA: Diagnosis not present

## 2018-03-04 DIAGNOSIS — I4891 Unspecified atrial fibrillation: Secondary | ICD-10-CM | POA: Diagnosis not present

## 2018-03-04 DIAGNOSIS — F329 Major depressive disorder, single episode, unspecified: Secondary | ICD-10-CM | POA: Diagnosis not present

## 2018-03-04 DIAGNOSIS — J189 Pneumonia, unspecified organism: Secondary | ICD-10-CM | POA: Diagnosis not present

## 2018-03-04 DIAGNOSIS — F0390 Unspecified dementia without behavioral disturbance: Secondary | ICD-10-CM | POA: Diagnosis not present

## 2018-03-04 DIAGNOSIS — F419 Anxiety disorder, unspecified: Secondary | ICD-10-CM | POA: Diagnosis not present

## 2018-03-04 DIAGNOSIS — I2510 Atherosclerotic heart disease of native coronary artery without angina pectoris: Secondary | ICD-10-CM | POA: Diagnosis not present

## 2018-03-09 DIAGNOSIS — J189 Pneumonia, unspecified organism: Secondary | ICD-10-CM | POA: Diagnosis not present

## 2018-03-09 DIAGNOSIS — F329 Major depressive disorder, single episode, unspecified: Secondary | ICD-10-CM | POA: Diagnosis not present

## 2018-03-09 DIAGNOSIS — I2510 Atherosclerotic heart disease of native coronary artery without angina pectoris: Secondary | ICD-10-CM | POA: Diagnosis not present

## 2018-03-09 DIAGNOSIS — F419 Anxiety disorder, unspecified: Secondary | ICD-10-CM | POA: Diagnosis not present

## 2018-03-09 DIAGNOSIS — I4891 Unspecified atrial fibrillation: Secondary | ICD-10-CM | POA: Diagnosis not present

## 2018-03-09 DIAGNOSIS — F0390 Unspecified dementia without behavioral disturbance: Secondary | ICD-10-CM | POA: Diagnosis not present

## 2018-03-11 DIAGNOSIS — F329 Major depressive disorder, single episode, unspecified: Secondary | ICD-10-CM | POA: Diagnosis not present

## 2018-03-11 DIAGNOSIS — F419 Anxiety disorder, unspecified: Secondary | ICD-10-CM | POA: Diagnosis not present

## 2018-03-11 DIAGNOSIS — I2510 Atherosclerotic heart disease of native coronary artery without angina pectoris: Secondary | ICD-10-CM | POA: Diagnosis not present

## 2018-03-11 DIAGNOSIS — J189 Pneumonia, unspecified organism: Secondary | ICD-10-CM | POA: Diagnosis not present

## 2018-03-11 DIAGNOSIS — I4891 Unspecified atrial fibrillation: Secondary | ICD-10-CM | POA: Diagnosis not present

## 2018-03-11 DIAGNOSIS — F0390 Unspecified dementia without behavioral disturbance: Secondary | ICD-10-CM | POA: Diagnosis not present

## 2018-03-13 DIAGNOSIS — J189 Pneumonia, unspecified organism: Secondary | ICD-10-CM | POA: Diagnosis not present

## 2018-03-13 DIAGNOSIS — I2510 Atherosclerotic heart disease of native coronary artery without angina pectoris: Secondary | ICD-10-CM | POA: Diagnosis not present

## 2018-03-13 DIAGNOSIS — F329 Major depressive disorder, single episode, unspecified: Secondary | ICD-10-CM | POA: Diagnosis not present

## 2018-03-13 DIAGNOSIS — F0390 Unspecified dementia without behavioral disturbance: Secondary | ICD-10-CM | POA: Diagnosis not present

## 2018-03-13 DIAGNOSIS — I4891 Unspecified atrial fibrillation: Secondary | ICD-10-CM | POA: Diagnosis not present

## 2018-03-13 DIAGNOSIS — F419 Anxiety disorder, unspecified: Secondary | ICD-10-CM | POA: Diagnosis not present

## 2018-03-16 DIAGNOSIS — I2510 Atherosclerotic heart disease of native coronary artery without angina pectoris: Secondary | ICD-10-CM | POA: Diagnosis not present

## 2018-03-16 DIAGNOSIS — J189 Pneumonia, unspecified organism: Secondary | ICD-10-CM | POA: Diagnosis not present

## 2018-03-16 DIAGNOSIS — F329 Major depressive disorder, single episode, unspecified: Secondary | ICD-10-CM | POA: Diagnosis not present

## 2018-03-16 DIAGNOSIS — F0390 Unspecified dementia without behavioral disturbance: Secondary | ICD-10-CM | POA: Diagnosis not present

## 2018-03-16 DIAGNOSIS — F419 Anxiety disorder, unspecified: Secondary | ICD-10-CM | POA: Diagnosis not present

## 2018-03-16 DIAGNOSIS — I4891 Unspecified atrial fibrillation: Secondary | ICD-10-CM | POA: Diagnosis not present

## 2018-03-17 DIAGNOSIS — R05 Cough: Secondary | ICD-10-CM | POA: Diagnosis not present

## 2018-03-17 DIAGNOSIS — Z6825 Body mass index (BMI) 25.0-25.9, adult: Secondary | ICD-10-CM | POA: Diagnosis not present

## 2018-03-18 DIAGNOSIS — F329 Major depressive disorder, single episode, unspecified: Secondary | ICD-10-CM | POA: Diagnosis not present

## 2018-03-18 DIAGNOSIS — I2510 Atherosclerotic heart disease of native coronary artery without angina pectoris: Secondary | ICD-10-CM | POA: Diagnosis not present

## 2018-03-18 DIAGNOSIS — F0390 Unspecified dementia without behavioral disturbance: Secondary | ICD-10-CM | POA: Diagnosis not present

## 2018-03-18 DIAGNOSIS — J189 Pneumonia, unspecified organism: Secondary | ICD-10-CM | POA: Diagnosis not present

## 2018-03-18 DIAGNOSIS — I4891 Unspecified atrial fibrillation: Secondary | ICD-10-CM | POA: Diagnosis not present

## 2018-03-18 DIAGNOSIS — F419 Anxiety disorder, unspecified: Secondary | ICD-10-CM | POA: Diagnosis not present

## 2018-03-19 DIAGNOSIS — F419 Anxiety disorder, unspecified: Secondary | ICD-10-CM | POA: Diagnosis not present

## 2018-03-19 DIAGNOSIS — I4891 Unspecified atrial fibrillation: Secondary | ICD-10-CM | POA: Diagnosis not present

## 2018-03-19 DIAGNOSIS — J189 Pneumonia, unspecified organism: Secondary | ICD-10-CM | POA: Diagnosis not present

## 2018-03-19 DIAGNOSIS — I2510 Atherosclerotic heart disease of native coronary artery without angina pectoris: Secondary | ICD-10-CM | POA: Diagnosis not present

## 2018-03-19 DIAGNOSIS — F329 Major depressive disorder, single episode, unspecified: Secondary | ICD-10-CM | POA: Diagnosis not present

## 2018-03-19 DIAGNOSIS — F0390 Unspecified dementia without behavioral disturbance: Secondary | ICD-10-CM | POA: Diagnosis not present

## 2018-03-20 DIAGNOSIS — J189 Pneumonia, unspecified organism: Secondary | ICD-10-CM | POA: Diagnosis not present

## 2018-03-20 DIAGNOSIS — F419 Anxiety disorder, unspecified: Secondary | ICD-10-CM | POA: Diagnosis not present

## 2018-03-20 DIAGNOSIS — F0390 Unspecified dementia without behavioral disturbance: Secondary | ICD-10-CM | POA: Diagnosis not present

## 2018-03-20 DIAGNOSIS — I2510 Atherosclerotic heart disease of native coronary artery without angina pectoris: Secondary | ICD-10-CM | POA: Diagnosis not present

## 2018-03-20 DIAGNOSIS — F329 Major depressive disorder, single episode, unspecified: Secondary | ICD-10-CM | POA: Diagnosis not present

## 2018-03-20 DIAGNOSIS — I4891 Unspecified atrial fibrillation: Secondary | ICD-10-CM | POA: Diagnosis not present

## 2018-03-23 DIAGNOSIS — I4891 Unspecified atrial fibrillation: Secondary | ICD-10-CM | POA: Diagnosis not present

## 2018-03-23 DIAGNOSIS — J189 Pneumonia, unspecified organism: Secondary | ICD-10-CM | POA: Diagnosis not present

## 2018-03-23 DIAGNOSIS — F419 Anxiety disorder, unspecified: Secondary | ICD-10-CM | POA: Diagnosis not present

## 2018-03-23 DIAGNOSIS — F329 Major depressive disorder, single episode, unspecified: Secondary | ICD-10-CM | POA: Diagnosis not present

## 2018-03-23 DIAGNOSIS — F0390 Unspecified dementia without behavioral disturbance: Secondary | ICD-10-CM | POA: Diagnosis not present

## 2018-03-23 DIAGNOSIS — I2510 Atherosclerotic heart disease of native coronary artery without angina pectoris: Secondary | ICD-10-CM | POA: Diagnosis not present

## 2018-03-27 DIAGNOSIS — F419 Anxiety disorder, unspecified: Secondary | ICD-10-CM | POA: Diagnosis not present

## 2018-03-27 DIAGNOSIS — F0390 Unspecified dementia without behavioral disturbance: Secondary | ICD-10-CM | POA: Diagnosis not present

## 2018-03-27 DIAGNOSIS — I4891 Unspecified atrial fibrillation: Secondary | ICD-10-CM | POA: Diagnosis not present

## 2018-03-27 DIAGNOSIS — F329 Major depressive disorder, single episode, unspecified: Secondary | ICD-10-CM | POA: Diagnosis not present

## 2018-03-27 DIAGNOSIS — I2510 Atherosclerotic heart disease of native coronary artery without angina pectoris: Secondary | ICD-10-CM | POA: Diagnosis not present

## 2018-03-27 DIAGNOSIS — J189 Pneumonia, unspecified organism: Secondary | ICD-10-CM | POA: Diagnosis not present

## 2018-03-28 DIAGNOSIS — F0390 Unspecified dementia without behavioral disturbance: Secondary | ICD-10-CM | POA: Diagnosis not present

## 2018-03-28 DIAGNOSIS — J189 Pneumonia, unspecified organism: Secondary | ICD-10-CM | POA: Diagnosis not present

## 2018-03-28 DIAGNOSIS — I2510 Atherosclerotic heart disease of native coronary artery without angina pectoris: Secondary | ICD-10-CM | POA: Diagnosis not present

## 2018-03-28 DIAGNOSIS — I10 Essential (primary) hypertension: Secondary | ICD-10-CM | POA: Diagnosis not present

## 2018-03-30 DIAGNOSIS — R0602 Shortness of breath: Secondary | ICD-10-CM | POA: Diagnosis not present

## 2018-03-30 DIAGNOSIS — I361 Nonrheumatic tricuspid (valve) insufficiency: Secondary | ICD-10-CM | POA: Diagnosis not present

## 2018-04-16 DIAGNOSIS — I2510 Atherosclerotic heart disease of native coronary artery without angina pectoris: Secondary | ICD-10-CM | POA: Diagnosis not present

## 2018-04-16 DIAGNOSIS — R457 State of emotional shock and stress, unspecified: Secondary | ICD-10-CM | POA: Diagnosis not present

## 2018-04-16 DIAGNOSIS — I4891 Unspecified atrial fibrillation: Secondary | ICD-10-CM | POA: Diagnosis not present

## 2018-04-16 DIAGNOSIS — R0789 Other chest pain: Secondary | ICD-10-CM | POA: Diagnosis not present

## 2018-04-16 DIAGNOSIS — Z7901 Long term (current) use of anticoagulants: Secondary | ICD-10-CM | POA: Diagnosis not present

## 2018-04-16 DIAGNOSIS — G309 Alzheimer's disease, unspecified: Secondary | ICD-10-CM | POA: Diagnosis not present

## 2018-04-16 DIAGNOSIS — I160 Hypertensive urgency: Secondary | ICD-10-CM | POA: Diagnosis not present

## 2018-04-16 DIAGNOSIS — K219 Gastro-esophageal reflux disease without esophagitis: Secondary | ICD-10-CM | POA: Diagnosis not present

## 2018-04-16 DIAGNOSIS — F0280 Dementia in other diseases classified elsewhere without behavioral disturbance: Secondary | ICD-10-CM | POA: Diagnosis not present

## 2018-04-16 DIAGNOSIS — I10 Essential (primary) hypertension: Secondary | ICD-10-CM | POA: Diagnosis not present

## 2018-04-16 DIAGNOSIS — J984 Other disorders of lung: Secondary | ICD-10-CM | POA: Diagnosis not present

## 2018-04-16 DIAGNOSIS — F419 Anxiety disorder, unspecified: Secondary | ICD-10-CM | POA: Diagnosis not present

## 2018-04-16 DIAGNOSIS — R079 Chest pain, unspecified: Secondary | ICD-10-CM | POA: Diagnosis not present

## 2018-04-16 DIAGNOSIS — E785 Hyperlipidemia, unspecified: Secondary | ICD-10-CM | POA: Diagnosis not present

## 2018-04-17 DIAGNOSIS — R079 Chest pain, unspecified: Secondary | ICD-10-CM | POA: Diagnosis not present

## 2018-04-17 DIAGNOSIS — I160 Hypertensive urgency: Secondary | ICD-10-CM | POA: Diagnosis not present

## 2018-04-23 DIAGNOSIS — F0390 Unspecified dementia without behavioral disturbance: Secondary | ICD-10-CM | POA: Diagnosis not present

## 2018-04-23 DIAGNOSIS — F329 Major depressive disorder, single episode, unspecified: Secondary | ICD-10-CM | POA: Diagnosis not present

## 2018-04-23 DIAGNOSIS — I2510 Atherosclerotic heart disease of native coronary artery without angina pectoris: Secondary | ICD-10-CM | POA: Diagnosis not present

## 2018-04-23 DIAGNOSIS — I4891 Unspecified atrial fibrillation: Secondary | ICD-10-CM | POA: Diagnosis not present

## 2018-04-23 DIAGNOSIS — Z6826 Body mass index (BMI) 26.0-26.9, adult: Secondary | ICD-10-CM | POA: Diagnosis not present

## 2019-08-18 IMAGING — CT CT HEAD W/O CM
3 of 8 series · 12 of 47 positions shown, 14 images · non-contrast
Comparison: Head CT September 28, 2009 ; brain MRI September 28, 2009

CLINICAL DATA: Pain following fall

EXAM:
CT HEAD WITHOUT CONTRAST
CT CERVICAL SPINE WITHOUT CONTRAST
TECHNIQUE: Multidetector CT imaging of the head and cervical spine was
performed following the standard protocol without intravenous
contrast. Multiplanar CT image reconstructions of the cervical spine
were also generated.

[Series 7: sag soft · sagittal · 0.36mm/px · 2 of 54 slices shown]
[im 18/54  brain]
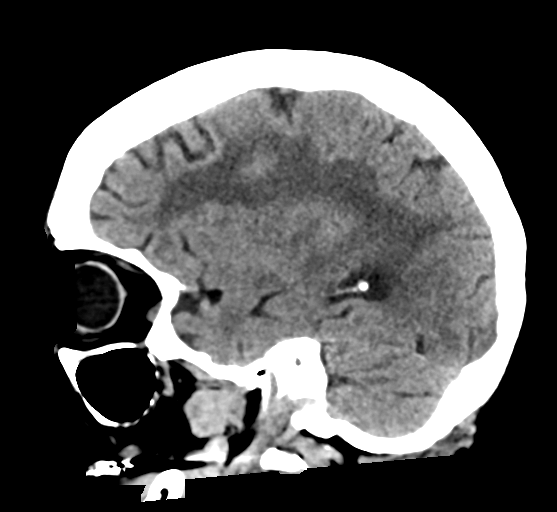
[im 36/54  brain]
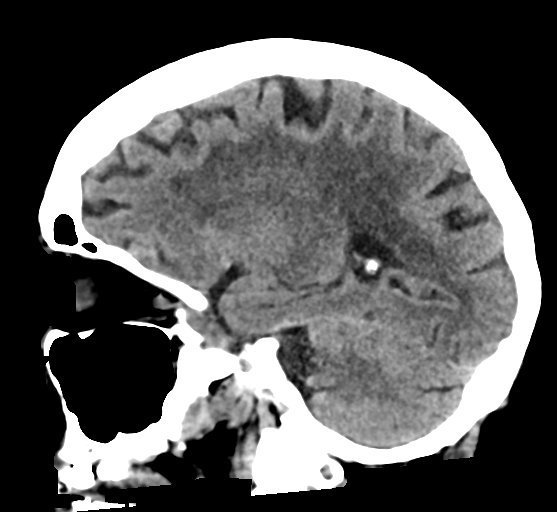

[Series 11: cor bone · coronal · 0.19mm/px · 3 of 61 slices shown]
[im 23/61  brain]
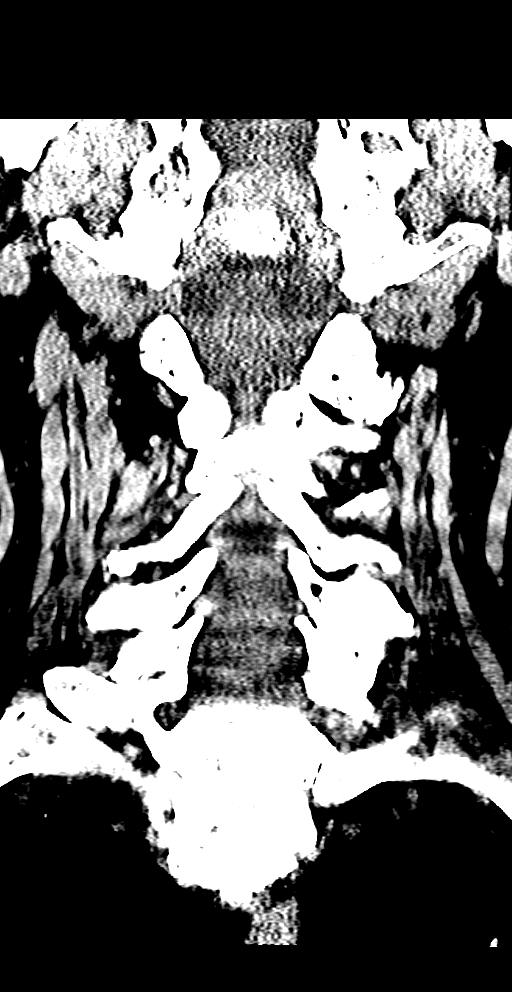
[im 31/61  brain]
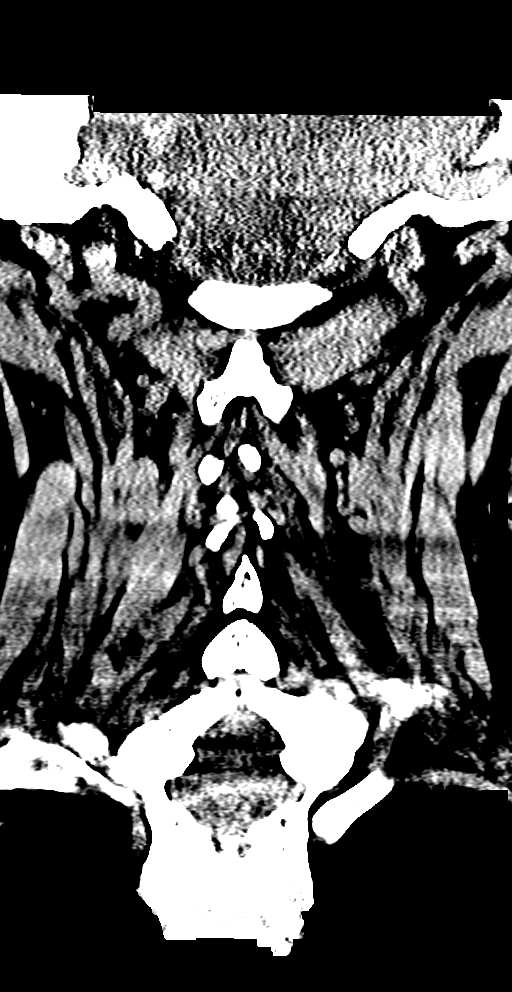
[im 38/61  brain]
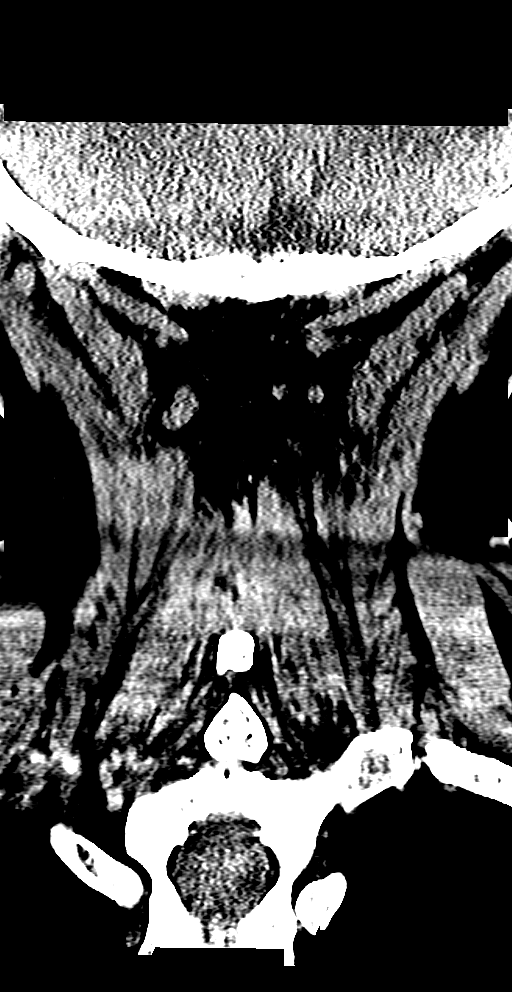

[Series 12: orthogonal axials · oblique · 0.21mm/px · 7 of 90 slices shown, 9 images]
[im 12/90  brain]
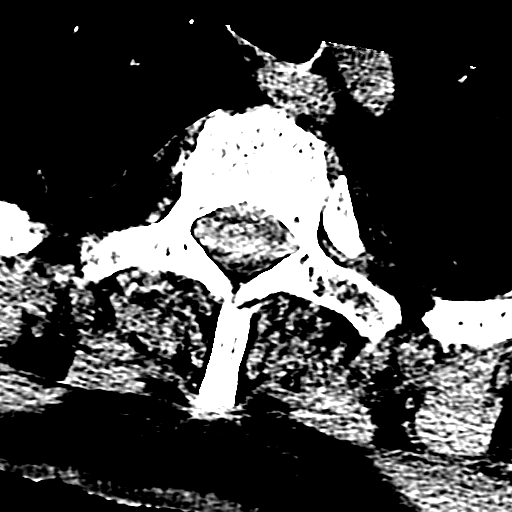
[im 12/90  bone]
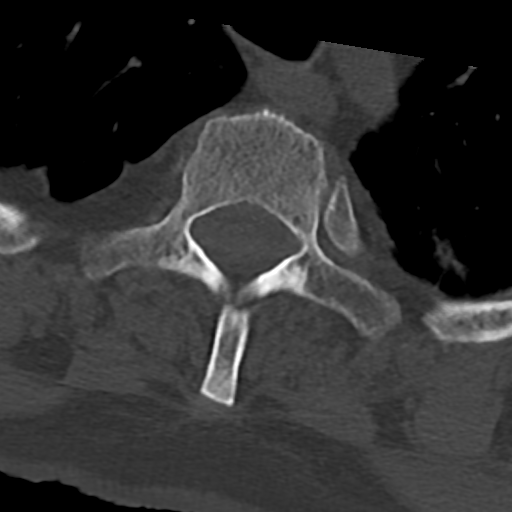
[im 23/90  brain]
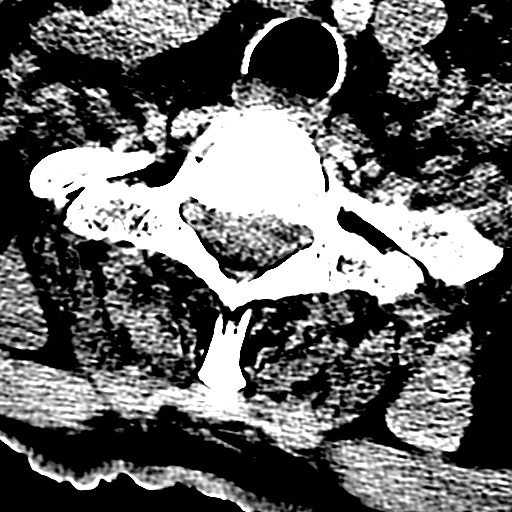
[im 34/90  brain]
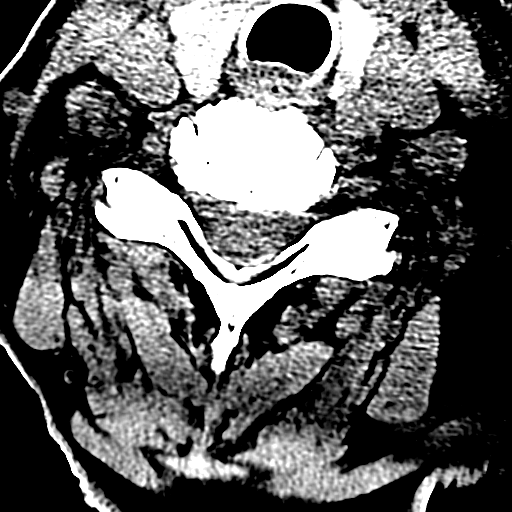
[im 45/90  brain]
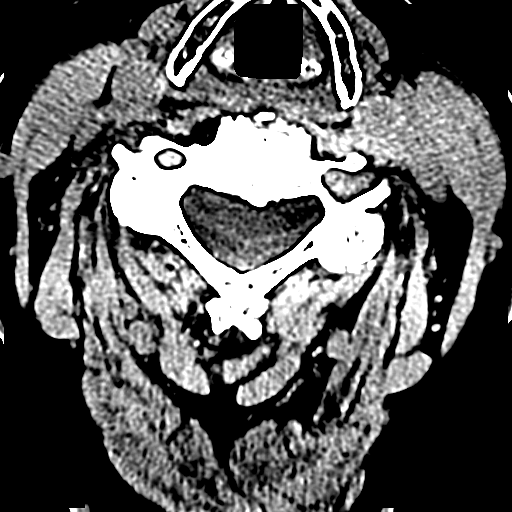
[im 56/90  brain]
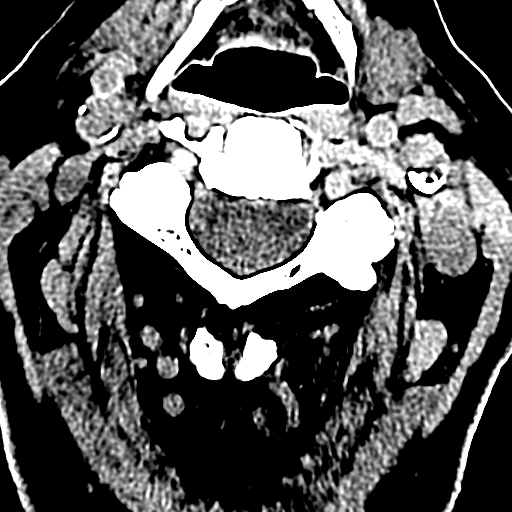
[im 56/90  bone]
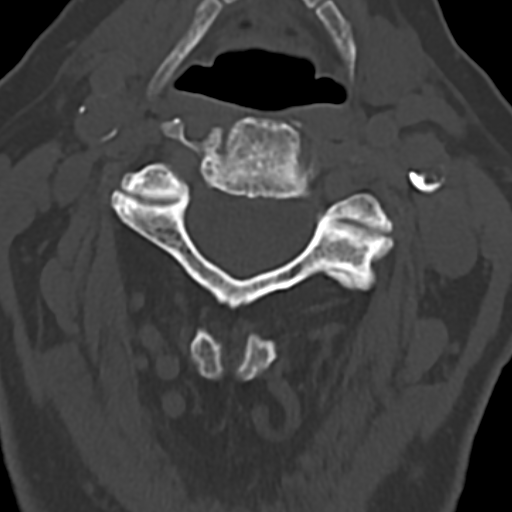
[im 67/90  brain]
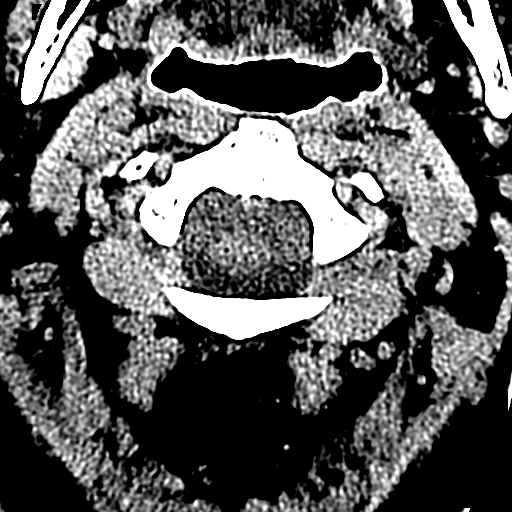
[im 78/90  brain]
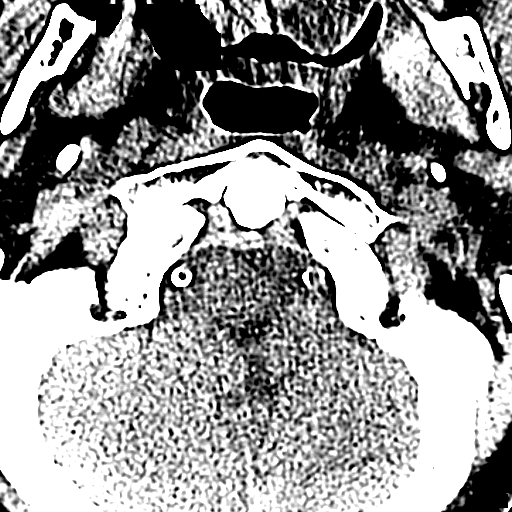

[12 of 47 positions shown; findings below may reference images not displayed]

FINDINGS: CT HEAD FINDINGS

Brain: Mild diffuse atrophy is stable. Right lateral ventricle is
larger than the left lateral ventricle, a stable anatomic variant.
There is no evident intracranial mass, hemorrhage, extra-axial fluid
collection, or midline shift. There is small vessel disease
throughout the centra semiovale bilaterally. There is evidence of a
prior infarct in the lateral right thalamus, noted to have been
acute on 9566 MR study. On the current examination, there is no
evidence suggesting acute infarct.

Vascular: There is no appreciable hyperdense vessel. There is
calcification in both distal vertebral arteries and basilar artery
as well as in the carotid siphon regions bilaterally.

Skull: Bony calvarium appears intact.

Sinuses/Orbits: There is mucosal thickening and opacification in
several ethmoid air cells bilaterally. Other paranasal sinuses are
clear. Orbits appear symmetric bilaterally.

Other: Mastoid air cells are clear.

CT CERVICAL SPINE FINDINGS

Alignment: There is no appreciable spondylolisthesis.

Skull base and vertebrae: Skull base and craniocervical junction
regions appear normal. There is moderate pannus with calcification
posterior to the odontoid which is not causing appreciable
impression on the craniocervical junction. There is no appreciable
fracture. There are no blastic or lytic bone lesions.

Soft tissues and spinal canal: Prevertebral soft tissues and
predental space regions are normal. There are foci of nuchal
ligament calcification posteriorly. There is no paraspinous lesion.
No cord or canal hematoma is evident.

Disc levels: There is moderately severe disc space narrowing at C5-6
and C6-7. There is moderate disc space narrowing at C3-4, C4-5,
C7-T1, and T1-2. There is multilevel facet osteoarthritic change.
There is no appreciable nerve root edema or effacement. No disc
extrusion or stenosis evident.

Upper chest: Visualized upper lung zones are clear.

Other: There are foci of carotid artery calcification bilaterally.
There is a subcentimeter nodular lesion in the left lobe of the
thyroid.
IMPRESSION: CT head: Atrophy with fairly extensive periventricular small vessel
disease. Prior infarct lateral right thalamus. No acute infarct
evident. No mass, hemorrhage, or extra-axial fluid collection.

There are foci of arterial vascular calcification. There is ethmoid
air cell disease bilaterally.

CT cervical spine: No fracture or spondylolisthesis. There is
multilevel arthropathy. No disc extrusion or stenosis.

There are foci of carotid artery calcification bilaterally.

A subcentimeter nodular opacity in the left lobe of the thyroid does
not meet sufficient size to warrant additional imaging surveillance
per consensus guidelines.

## 2021-02-15 ENCOUNTER — Emergency Department (HOSPITAL_COMMUNITY): Payer: Medicare Other

## 2021-02-15 ENCOUNTER — Encounter (HOSPITAL_COMMUNITY): Payer: Self-pay

## 2021-02-15 ENCOUNTER — Emergency Department (HOSPITAL_COMMUNITY)
Admission: EM | Admit: 2021-02-15 | Discharge: 2021-02-15 | Disposition: A | Discharge status: Home / Self Care | Payer: Medicare Other | Attending: Emergency Medicine | Admitting: Emergency Medicine

## 2021-02-15 ENCOUNTER — Other Ambulatory Visit: Payer: Self-pay

## 2021-02-15 DIAGNOSIS — M25569 Pain in unspecified knee: Secondary | ICD-10-CM | POA: Diagnosis not present

## 2021-02-15 DIAGNOSIS — I2510 Atherosclerotic heart disease of native coronary artery without angina pectoris: Secondary | ICD-10-CM | POA: Diagnosis not present

## 2021-02-15 DIAGNOSIS — W19XXXA Unspecified fall, initial encounter: Secondary | ICD-10-CM | POA: Insufficient documentation

## 2021-02-15 DIAGNOSIS — Z951 Presence of aortocoronary bypass graft: Secondary | ICD-10-CM | POA: Insufficient documentation

## 2021-02-15 DIAGNOSIS — Z7901 Long term (current) use of anticoagulants: Secondary | ICD-10-CM | POA: Diagnosis not present

## 2021-02-15 DIAGNOSIS — R296 Repeated falls: Secondary | ICD-10-CM

## 2021-02-15 DIAGNOSIS — I4891 Unspecified atrial fibrillation: Secondary | ICD-10-CM | POA: Diagnosis not present

## 2021-02-15 DIAGNOSIS — R531 Weakness: Secondary | ICD-10-CM | POA: Insufficient documentation

## 2021-02-15 DIAGNOSIS — I10 Essential (primary) hypertension: Secondary | ICD-10-CM | POA: Diagnosis not present

## 2021-02-15 DIAGNOSIS — Z79899 Other long term (current) drug therapy: Secondary | ICD-10-CM | POA: Insufficient documentation

## 2021-02-15 LAB — CBC WITH DIFFERENTIAL/PLATELET
Abs Immature Granulocytes: 0.04 10*3/uL (ref 0.00–0.07)
Basophils Absolute: 0 10*3/uL (ref 0.0–0.1)
Basophils Relative: 1 %
Eosinophils Absolute: 0.1 10*3/uL (ref 0.0–0.5)
Eosinophils Relative: 1 %
HCT: 45.5 % (ref 36.0–46.0)
Hemoglobin: 14.4 g/dL (ref 12.0–15.0)
Immature Granulocytes: 1 %
Lymphocytes Relative: 27 %
Lymphs Abs: 2.3 10*3/uL (ref 0.7–4.0)
MCH: 30.5 pg (ref 26.0–34.0)
MCHC: 31.6 g/dL (ref 30.0–36.0)
MCV: 96.4 fL (ref 80.0–100.0)
Monocytes Absolute: 0.8 10*3/uL (ref 0.1–1.0)
Monocytes Relative: 10 %
Neutro Abs: 5 10*3/uL (ref 1.7–7.7)
Neutrophils Relative %: 60 %
Platelets: 263 10*3/uL (ref 150–400)
RBC: 4.72 MIL/uL (ref 3.87–5.11)
RDW: 13.7 % (ref 11.5–15.5)
WBC: 8.2 10*3/uL (ref 4.0–10.5)
nRBC: 0 % (ref 0.0–0.2)

## 2021-02-15 LAB — COMPREHENSIVE METABOLIC PANEL
ALT: 15 U/L (ref 0–44)
AST: 21 U/L (ref 15–41)
Albumin: 3.9 g/dL (ref 3.5–5.0)
Alkaline Phosphatase: 72 U/L (ref 38–126)
Anion gap: 11 (ref 5–15)
BUN: 18 mg/dL (ref 8–23)
CO2: 28 mmol/L (ref 22–32)
Calcium: 9.6 mg/dL (ref 8.9–10.3)
Chloride: 100 mmol/L (ref 98–111)
Creatinine, Ser: 0.87 mg/dL (ref 0.44–1.00)
GFR, Estimated: >60 mL/min (ref >60)
Glucose, Bld: 100 mg/dL — ABNORMAL HIGH (ref 70–99)
Potassium: 4.7 mmol/L (ref 3.5–5.1)
Sodium: 139 mmol/L (ref 135–145)
Total Bilirubin: 0.5 mg/dL (ref 0.3–1.2)
Total Protein: 7.4 g/dL (ref 6.5–8.1)

## 2021-02-15 NOTE — ED Provider Notes (Signed)
I saw and evaluated the patient, reviewed the resident's note and I agree with the findings and plan.  87 year old female who is here after mechanical fall just prior to arrival.  Patient states she fell to her lower back.  Denies striking her head although she is on Eliquis.  Her exam is nonfocal at this time.  She does have a history of dementia.  We will therefore order CTs and blood work and reassess   Lacretia Leigh, MD 02/15/21 1630

## 2021-02-15 NOTE — ED Triage Notes (Signed)
Pt BIB [facility] from [facility]. Patient had an unwitnessed fall today. Facility states that pt has had an increase in falls, tremors. And overall seems weaker. Pt has a hx of A Fib, A. Flutter, and dementia. Patient is taking a blood thinner.   Vital signs were: 114/64  20-RR 96% RA 151-CBG 97.1 Temp

## 2021-02-15 NOTE — Discharge Instructions (Addendum)
You were evaluated in the ER today due to a fall. All of the imaging of your head, neck, and back did not show any acute concerns. If you have any loss of consciousness, extreme headache that won't go away, vision changes, or sudden weakness please get evaluated urgently.

## 2021-02-15 NOTE — ED Provider Notes (Signed)
[facility] DEPT Provider Note   CSN: [id number] Arrival date & time: 02/15/21  1511     History No chief complaint on file.   Marissa Bowen is a 87 y.o. female presenting for an unwitnessed fall.  Patient reports that this afternoon she was reaching up trying to get a blanket off of a shelf when the blankets fell on her and knocked her back into the door. She states that she fell on her buttocks and did not hit her head, though no one saw the event happen. Patient notes that she has some mild knee pain. Denies any headaches, vision changes, focal weakness, bleeding. She thinks that she is on a blood thinner because of her heart.   Past Medical History:  Diagnosis Date   Anemia    Anisocoria 02/27/2017   Sees Dr Rutherford Guys   Anxiety    BACK PAIN, LUMBAR 09/27/2009   CAD (coronary artery disease)    a. s/p remote CABG 1997, 3V.;  b. LexiScan Myoview (8/15): No ischemia, EF 72%, normal study;  c. Echo 12/13 - EF 60-65%, no significant valvular abnormalities, normal RV size and systolic function.     CVA (cerebral infarction)    a. Thalamic infarct 09/2009. //  b. hx of TIA in 2008   Gastric ulcer    GERD 11/25/2006   GI bleed    a. 09/2009: secondary to antral ulcer.   Hiatal hernia    History of diverticulitis Dx 02/2012   History of MI (myocardial infarction)    HYPERLIPIDEMIA    HYPERTENSION    IBS 12/07/2008   MIGRAINE WITH AURA 08/13/2007   Mild carotid artery disease (HCC)    a. 0-39% bilat 05/2011 (f/u recommended 05/2013). //  b. Carotid US 3/17 - Bilat ICA 1-39% >> FU prn   Muscle weakness (generalized) 09/27/2009   OSTEOARTHRITIS 08/13/2007   PAF (paroxysmal atrial fibrillation) ([place]) 11/25/2006   a. Multaq >> changes to Amiodarone 2/2 $$  //  Eliquis (CHADS2-VASc = 6)   Urinary, incontinence, stress female    wears Pessary    Patient Active Problem List   Diagnosis Date Noted   Hallucinations 03/11/2017   Essential tremor 03/11/2017    Prolonged Q-T interval on ECG 02/28/2017   Recurrent falls 02/27/2017   Epistaxis 02/27/2017   TIA (transient ischemic attack) 02/21/2017   GAD (generalized anxiety disorder) 11/19/2016   Diverticulitis 10/05/2016   Elevated LFTs 10/04/2016   SIRS (systemic inflammatory response syndrome) ([place]) 10/04/2016   Macular degeneration of both eyes 09/22/2015   Carotid stenosis 08/10/2015   On amiodarone therapy 08/10/2015   Chronic cholecystitis with calculus 01/03/2015   Allergic rhinitis 08/05/2014   Essential hypertension 05/03/2014   Osteoarthritis 09/29/2013   Routine general medical examination at a health care facility 09/29/2013   Depression with anxiety 08/03/2013   Atrial fibrillation ([place]) 09/02/2012   Anemia due to chronic blood loss 08/18/2012   Diarrhea 06/08/2012   Postural hypotension 06/08/2012   ACUTE POSTHEMORRHAGIC ANEMIA 10/23/2009   BACK PAIN, LUMBAR 09/27/2009   MUSCLE WEAKNESS (GENERALIZED) 09/27/2009   IBS 12/07/2008   Weakness 07/21/2008   MIGRAINE WITH AURA 08/13/2007   OSTEOARTHRITIS 08/13/2007   TRANSIENT ISCHEMIC ATTACK 04/13/2007   Hyperlipidemia LDL goal <100 11/25/2006   CAD (coronary artery disease) 11/25/2006   GERD 11/25/2006   DIVERTICULITIS, HX OF 11/25/2006    Past Surgical History:  Procedure Laterality Date   ABDOMINAL HYSTERECTOMY  1973   CATARACT EXTRACTION W/ INTRAOCULAR  LENS  IMPLANT, BILATERAL Bilateral 1999   CHOLECYSTECTOMY N/A 01/03/2015   Procedure: LAPAROSCOPIC CHOLECYSTECTOMY WITH INTRAOPERATIVE CHOLANGIOGRAM;  Surgeon: Donnie Mesa, MD;  Location: [facility];  Service: General;  Laterality: N/A;   CORONARY ANGIOPLASTY  1997   Dr Lia Foyer   CORONARY ARTERY BYPASS GRAFT  1997   Dr Melvenia Needles   LAPAROSCOPIC CHOLECYSTECTOMY  01/03/2015   TONSILLECTOMY       OB History   No obstetric history on file.     Family History  Problem Relation Age of Onset   Heart attack Mother    Heart failure Father    Throat cancer Sister    Heart  disease Sister    Coronary artery disease Sister    Diabetes Sister    Heart attack Sister    Colon cancer Neg Hx     Social History   Tobacco Use   Smoking status: Never   Smokeless tobacco: Never  Vaping Use   Vaping Use: Never used  Substance Use Topics   Alcohol use: No   Drug use: No    Home Medications Prior to Admission medications   Medication Sig Start Date End Date Taking? Authorizing Provider  acetaminophen (TYLENOL) 325 MG tablet Take 650 mg by mouth every 6 (six) hours as needed for moderate pain.     [provider]  amiodarone (PACERONE) 200 MG tablet TAKE ONE-HALF TABLET DAILY 01/08/17   Gildardo Cranker, DO  bisacodyl (DULCOLAX) 10 MG suppository Place 10 mg rectally as needed for moderate constipation.    [provider]  DULoxetine (CYMBALTA) 60 MG capsule Take 1 capsule (60 mg total) by mouth daily. For mood and chronic pain 01/29/17   Gildardo Cranker, DO  ELIQUIS 5 MG TABS tablet Take 1 tablet (5 mg total) by mouth 2 (two) times daily. 07/26/16   Josue Hector, MD  ezetimibe (ZETIA) 10 MG tablet Take 1 tablet (10 mg total) by mouth daily. 02/26/17   Dessa Phi, DO  losartan (COZAAR) 50 MG tablet TAKE ONE TABLET TWICE DAILY 09/11/16   Josue Hector, MD  magnesium hydroxide (MILK OF MAGNESIA) 400 MG/5ML suspension Take 30 mLs by mouth daily as needed for mild constipation.    [provider]  meclizine (ANTIVERT) 12.5 MG tablet Take 12.5 mg by mouth every 8 (eight) hours as needed for dizziness.     [provider]  Multiple Vitamin (MULTIVITAMIN WITH MINERALS) TABS tablet Take 1 tablet by mouth daily.    [provider]  Multiple Vitamins-Minerals (PRESERVISION AREDS) TABS Take 1 tablet by mouth daily.     [provider]  niacin 500 MG CR capsule Take 500 mg by mouth 2 (two) times daily.     [provider]  pantoprazole (PROTONIX) 40 MG tablet TAKE ONE TABLET TWICE DAILY 09/17/16   Gildardo Cranker,  DO  QUEtiapine (SEROQUEL) 25 MG tablet Take 1 tablet (25 mg total) by mouth at bedtime. 02/25/17   Dessa Phi, DO  sodium chloride (OCEAN) 0.65 % SOLN nasal spray Place 1 spray into both nostrils at bedtime.     [provider]  Sodium Phosphates (RA SALINE ENEMA) 19-7 GM/118ML ENEM Place 118 mLs rectally daily as needed (FOR CONSTIPATION).    [provider]  Tetrahydrozoline HCl (VISINE OP) Place 2 drops into both eyes at bedtime.     [provider]  sertraline (ZOLOFT) 25 MG tablet Take 1 tablet (25 mg total) by mouth daily. 02/14/11 10/19/11  Marletta Lor,  MD    Allergies    Asa [aspirin], Crestor [rosuvastatin calcium], Hydrocodone, Ibuprofen, Oxycodone hcl, Penicillins, Prednisone, Statins, Morphine and related, and Pristiq [desvenlafaxine]  Review of Systems   Review of Systems  Constitutional:  Negative for activity change, appetite change, chills, diaphoresis, fatigue and fever.  HENT:  Negative for congestion, trouble swallowing and voice change.   Eyes:  Negative for pain and visual disturbance.  Respiratory:  Negative for cough, chest tightness and shortness of breath.   Cardiovascular:  Negative for chest pain, palpitations and leg swelling.  Gastrointestinal:  Negative for abdominal distention, abdominal pain, constipation, diarrhea, nausea and vomiting.  Genitourinary:  Negative for decreased urine volume and difficulty urinating.  Musculoskeletal:  Positive for joint swelling (bilateral knee pain). Negative for back pain, neck pain and neck stiffness.  Skin:  Negative for wound.  Neurological:  Positive for tremors (baseline tremors) and weakness (baseline generalized weakness). Negative for dizziness, light-headedness and headaches.   Physical Exam Updated Vital Signs BP 98/72   Pulse 72   Temp 97.8 F (36.6 C)   Resp 19   Ht 5\' 7"  (1.702 m)   Wt 68 kg   SpO2 95%   BMI 23.48 kg/m   Physical Exam Constitutional:       General: She is not in acute distress.    Appearance: Normal appearance.  HENT:     Head: Normocephalic and atraumatic.     Right Ear: External ear normal.     Left Ear: External ear normal.     Nose: Nose normal.     Mouth/Throat:     Mouth: Mucous membranes are moist.     Pharynx: Oropharynx is clear.  Eyes:     Extraocular Movements: Extraocular movements intact.     Conjunctiva/sclera: Conjunctivae normal.     Pupils: Pupils are equal, round, and reactive to light.  Cardiovascular:     Rate and Rhythm: Rhythm irregular.     Pulses: Normal pulses.     Heart sounds: Normal heart sounds.     Comments: Patient in afib Pulmonary:     Effort: Pulmonary effort is normal. No respiratory distress.     Breath sounds: Normal breath sounds.  Abdominal:     General: Abdomen is flat. Bowel sounds are normal.     Palpations: Abdomen is soft.     Tenderness: There is no abdominal tenderness. There is no guarding.  Musculoskeletal:        General: No swelling or tenderness. Normal range of motion.     Cervical back: Normal range of motion and neck supple. No tenderness.     Right lower leg: No edema.     Left lower leg: No edema.     Comments: Full ROM of bilateral knees  Skin:    General: Skin is warm and dry.     Capillary Refill: Capillary refill takes less than 2 seconds.     Findings: No bruising.  Neurological:     General: No focal deficit present.     Mental Status: She is alert and oriented to person, place, and time.     Cranial Nerves: No cranial nerve deficit.     Sensory: No sensory deficit.     Motor: Weakness (generalized weakness, no focality) present.  Psychiatric:        Mood and Affect: Mood normal.    ED Results / Procedures / Treatments   Labs (all labs ordered are listed, but only abnormal results are displayed) Labs Reviewed  COMPREHENSIVE METABOLIC PANEL - Abnormal; Notable for the following components:      Result Value   Glucose, Bld 100 (*)    All  other components within normal limits  CBC WITH DIFFERENTIAL/PLATELET  URINALYSIS, ROUTINE W REFLEX MICROSCOPIC    EKG None Atrial fibrillation  Radiology DG Lumbar Spine Complete  Result Date: 02/15/2021 CLINICAL DATA:  Low back pain after unwitnessed fall. EXAM: LUMBAR SPINE - COMPLETE 4+ VIEW COMPARISON:  October 04, 2016. FINDINGS: Minimal grade 1 anterolisthesis of L3-4 is noted secondary to posterior facet joint hypertrophy which is unchanged compared to prior exam. No fracture is noted. Mild degenerative disc disease is noted at L1-2, L2-3 and L3-4. IMPRESSION: Multilevel degenerative changes as described above. No acute abnormality is noted. Aortic Atherosclerosis (ICD10-I70.0). Electronically Signed   By: Marijo Conception M.D.   On: 02/15/2021 17:31   CT Head Wo Contrast  Result Date: 02/15/2021 CLINICAL DATA:  Unwitnessed fall. EXAM: CT HEAD WITHOUT CONTRAST TECHNIQUE: Contiguous axial images were obtained from the base of the skull through the vertex without intravenous contrast. COMPARISON:  February 21, 2017. FINDINGS: Brain: Mild chronic ischemic white matter disease is noted. No mass effect or midline shift is noted. Ventricular size is within normal limits. There is no evidence of mass lesion, hemorrhage or acute infarction. Vascular: No hyperdense vessel or unexpected calcification. Skull: Normal. Negative for fracture or focal lesion. Sinuses/Orbits: No acute finding. Other: None. IMPRESSION: No acute intracranial abnormality seen. Electronically Signed   By: Marijo Conception M.D.   On: 02/15/2021 17:05   CT Cervical Spine Wo Contrast  Result Date: 02/15/2021 CLINICAL DATA:  Un witnessed fall, anticoagulated, dimension EXAM: CT CERVICAL SPINE WITHOUT CONTRAST TECHNIQUE: Multidetector CT imaging of the cervical spine was performed without intravenous contrast. Multiplanar CT image reconstructions were also generated. COMPARISON:  02/21/2017 FINDINGS: Alignment: Alignment is anatomic.  Skull base and vertebrae: No acute fracture. No primary bone lesion or focal pathologic process. Soft tissues and spinal canal: No prevertebral fluid or swelling. No visible canal hematoma. Disc levels: There is multilevel cervical spondylosis and facet hypertrophy, most pronounced at the C5-6 and C6-7 levels. Upper chest: Airway is patent.  Lung apices are clear. Other: Reconstructed images demonstrate no additional findings. IMPRESSION: 1. No acute cervical spine fracture. 2. Multilevel spondylosis and facet hypertrophy, greatest at C5-6 and C6-7. Electronically Signed   By: Randa Ngo M.D.   On: 02/15/2021 17:04    Procedures Procedures   Medications Ordered in ED Medications - No data to display  ED Course  I have reviewed the triage vital signs and the nursing notes.  Pertinent labs & imaging results that were available during my care of the patient were reviewed by me and considered in my medical decision making (see chart for details).    MDM Rules/Calculators/A&P  KIMIYAH BLICK is a.age female presenting from [facility] facility due to an unwitnessed fall and and overall increased weakness lately. Only patient was present in the room during the interview and examination. PMH significant for Afib, HTN, dementia, CAD, anemia, OA, IBS, TIA, essential tremor.  Patient fell after reaching up for blankets, which fell on her and knocked her backwards. Fall was unwitnessed, patient reports that she did not hit her head, but given history of dementia we cannot ensure that. Given the fact that she is on blood thinners, has dementia, and the fall was unwitnessed we will obtain a CT head and neck, Lumbar spine Xrays. Will obtain basic labs  of CMP and CBC, UA, as well as an EKG.  CT head and neck negative for acute processes. Lumbar spine X-rays showed aortic atherosclerosis, degenerative changes without acute abnormality. CBC and CMP within normal limits.   Given patient's negative CT scans and  reassuring physical exam, do not feel that further work-up is warranted at this time. Patient is good to be discharged back to her facility.  Final Clinical Impression(s) / ED Diagnoses Final diagnoses:  Unwitnessed fall  Atrial fibrillation, unspecified type ([place])  Weakness     Charika Mikelson, [place], DO 02/15/21 1907    Lacretia Leigh, MD 02/15/21 2232

## 2021-02-25 ENCOUNTER — Other Ambulatory Visit: Payer: Self-pay

## 2021-02-25 ENCOUNTER — Encounter (HOSPITAL_COMMUNITY): Payer: Self-pay | Admitting: Pharmacy Technician

## 2021-02-25 ENCOUNTER — Emergency Department (HOSPITAL_COMMUNITY): Payer: Medicare Other

## 2021-02-25 ENCOUNTER — Emergency Department (HOSPITAL_COMMUNITY)
Admission: EM | Admit: 2021-02-25 | Discharge: 2021-02-25 | Disposition: A | Discharge status: Home / Self Care | Payer: Medicare Other | Attending: Emergency Medicine | Admitting: Emergency Medicine

## 2021-02-25 DIAGNOSIS — I4891 Unspecified atrial fibrillation: Secondary | ICD-10-CM | POA: Diagnosis not present

## 2021-02-25 DIAGNOSIS — I10 Essential (primary) hypertension: Secondary | ICD-10-CM | POA: Insufficient documentation

## 2021-02-25 DIAGNOSIS — I2510 Atherosclerotic heart disease of native coronary artery without angina pectoris: Secondary | ICD-10-CM | POA: Diagnosis not present

## 2021-02-25 DIAGNOSIS — Y92002 Bathroom of unspecified non-institutional (private) residence single-family (private) house as the place of occurrence of the external cause: Secondary | ICD-10-CM | POA: Insufficient documentation

## 2021-02-25 DIAGNOSIS — W19XXXA Unspecified fall, initial encounter: Secondary | ICD-10-CM | POA: Insufficient documentation

## 2021-02-25 DIAGNOSIS — S0990XA Unspecified injury of head, initial encounter: Secondary | ICD-10-CM | POA: Diagnosis not present

## 2021-02-25 DIAGNOSIS — R0781 Pleurodynia: Secondary | ICD-10-CM | POA: Insufficient documentation

## 2021-02-25 DIAGNOSIS — Z79899 Other long term (current) drug therapy: Secondary | ICD-10-CM | POA: Insufficient documentation

## 2021-02-25 DIAGNOSIS — Z955 Presence of coronary angioplasty implant and graft: Secondary | ICD-10-CM | POA: Diagnosis not present

## 2021-02-25 DIAGNOSIS — Z7901 Long term (current) use of anticoagulants: Secondary | ICD-10-CM | POA: Insufficient documentation

## 2021-02-25 LAB — COMPREHENSIVE METABOLIC PANEL
ALT: 17 U/L (ref 0–44)
AST: 24 U/L (ref 15–41)
Albumin: 3.4 g/dL — ABNORMAL LOW (ref 3.5–5.0)
Alkaline Phosphatase: 65 U/L (ref 38–126)
Anion gap: 13 (ref 5–15)
BUN: 16 mg/dL (ref 8–23)
CO2: 23 mmol/L (ref 22–32)
Calcium: 8.9 mg/dL (ref 8.9–10.3)
Chloride: 98 mmol/L (ref 98–111)
Creatinine, Ser: 0.87 mg/dL (ref 0.44–1.00)
GFR, Estimated: >60 mL/min (ref >60)
Glucose, Bld: 91 mg/dL (ref 70–99)
Potassium: 4 mmol/L (ref 3.5–5.1)
Sodium: 134 mmol/L — ABNORMAL LOW (ref 135–145)
Total Bilirubin: 0.9 mg/dL (ref 0.3–1.2)
Total Protein: 6.5 g/dL (ref 6.5–8.1)

## 2021-02-25 LAB — CBC WITH DIFFERENTIAL/PLATELET
Abs Immature Granulocytes: 0.03 10*3/uL (ref 0.00–0.07)
Basophils Absolute: 0.1 10*3/uL (ref 0.0–0.1)
Basophils Relative: 1 %
Eosinophils Absolute: 0.1 10*3/uL (ref 0.0–0.5)
Eosinophils Relative: 2 %
HCT: 39.9 % (ref 36.0–46.0)
Hemoglobin: 12.7 g/dL (ref 12.0–15.0)
Immature Granulocytes: 0 %
Lymphocytes Relative: 25 %
Lymphs Abs: 1.9 10*3/uL (ref 0.7–4.0)
MCH: 30.7 pg (ref 26.0–34.0)
MCHC: 31.8 g/dL (ref 30.0–36.0)
MCV: 96.4 fL (ref 80.0–100.0)
Monocytes Absolute: 1 10*3/uL (ref 0.1–1.0)
Monocytes Relative: 13 %
Neutro Abs: 4.7 10*3/uL (ref 1.7–7.7)
Neutrophils Relative %: 59 %
Platelets: 213 10*3/uL (ref 150–400)
RBC: 4.14 MIL/uL (ref 3.87–5.11)
RDW: 14.1 % (ref 11.5–15.5)
WBC: 7.9 10*3/uL (ref 4.0–10.5)
nRBC: 0 % (ref 0.0–0.2)

## 2021-02-25 LAB — CK: Total CK: 85 U/L (ref 38–234)

## 2021-02-25 LAB — PROTIME-INR
INR: 1.2 (ref 0.8–1.2)
Prothrombin Time: 15.4 seconds — ABNORMAL HIGH (ref 11.4–15.2)

## 2021-02-25 NOTE — ED Triage Notes (Signed)
Pt bib ems from [facility] unwitnessed fall today. Pt had to crawl from bathroom to her room. Pt called her daughter who alerted staff that pt was on the floor. Pt denies LOC, states "it was a wild fall". Pt with pain behind R arm. No deformities noted. VSS with ems. Pt alert and oriented X4. Pt on eliquis.

## 2021-02-25 NOTE — Discharge Instructions (Signed)
Your images did not show any signs of fractures or other abnormalities at this time. Please take Tylenol as needed for pain.   Follow up with your PCP for further evaluation  Return to the ED for any new/worsening symptoms

## 2021-02-25 NOTE — ED Notes (Signed)
Pt with R hip pain, no shortening or rotation noted.

## 2021-02-25 NOTE — ED Notes (Signed)
Patient transported to CT 

## 2021-02-25 NOTE — ED Provider Notes (Signed)
[facility]PART[facility] Provider Note   CSN: [id number] Arrival date & time: 02/25/21  1747     History Chief Complaint  Patient presents with   Marissa Bowen is a 87 y.o. female with PMHx HTN, HLD, CAD, PAF on Eliquis who presents to the ED via EMS from [place] with complaint of mechanical fall that occurred earlier today. Pt reports she was in the bathroom and feels like her legs twisted beneath her causing her to fall. She states she does not believes she hit her head. She had to crawl from the bathroom to her bedroom and is unsure how long she was on the ground crying for help. She was able to call her family member once she reached her phone who called the facility to go evaluate her. She complains of pain mostly to her right rib cage area. EMS reports they are unsure if she may have fell with her arm across the sink trying to catch herself. They report she is alert and oriented x 4 without other complaints. They did not appreciate any signs of head trauma.   The history is provided by the patient, the EMS personnel and medical records.      Past Medical History:  Diagnosis Date   Anemia    Anisocoria 02/27/2017   Sees Dr Rutherford Guys   Anxiety    BACK PAIN, LUMBAR 09/27/2009   CAD (coronary artery disease)    a. s/p remote CABG 1997, 3V.;  b. LexiScan Myoview (8/15): No ischemia, EF 72%, normal study;  c. Echo 12/13 - EF 60-65%, no significant valvular abnormalities, normal RV size and systolic function.     CVA (cerebral infarction)    a. Thalamic infarct 09/2009. //  b. hx of TIA in 2008   Gastric ulcer    GERD 11/25/2006   GI bleed    a. 09/2009: secondary to antral ulcer.   Hiatal hernia    History of diverticulitis Dx 02/2012   History of MI (myocardial infarction)    HYPERLIPIDEMIA    HYPERTENSION    IBS 12/07/2008   MIGRAINE WITH AURA 08/13/2007   Mild carotid artery disease (HCC)    a. 0-39% bilat 05/2011 (f/u recommended  05/2013). //  b. Carotid US 3/17 - Bilat ICA 1-39% >> FU prn   Muscle weakness (generalized) 09/27/2009   OSTEOARTHRITIS 08/13/2007   PAF (paroxysmal atrial fibrillation) ([place]) 11/25/2006   a. Multaq >> changes to Amiodarone 2/2 $$  //  Eliquis (CHADS2-VASc = 6)   Urinary, incontinence, stress female    wears Pessary    Patient Active Problem List   Diagnosis Date Noted   Hallucinations 03/11/2017   Essential tremor 03/11/2017   Prolonged Q-T interval on ECG 02/28/2017   Recurrent falls 02/27/2017   Epistaxis 02/27/2017   TIA (transient ischemic attack) 02/21/2017   GAD (generalized anxiety disorder) 11/19/2016   Diverticulitis 10/05/2016   Elevated LFTs 10/04/2016   SIRS (systemic inflammatory response syndrome) ([place]) 10/04/2016   Macular degeneration of both eyes 09/22/2015   Carotid stenosis 08/10/2015   On amiodarone therapy 08/10/2015   Chronic cholecystitis with calculus 01/03/2015   Allergic rhinitis 08/05/2014   Essential hypertension 05/03/2014   Osteoarthritis 09/29/2013   Routine general medical examination at a health care facility 09/29/2013   Depression with anxiety 08/03/2013   Atrial fibrillation ([place]) 09/02/2012   Anemia due to chronic blood loss 08/18/2012   Diarrhea 06/08/2012   Postural hypotension 06/08/2012  ACUTE POSTHEMORRHAGIC ANEMIA 10/23/2009   BACK PAIN, LUMBAR 09/27/2009   MUSCLE WEAKNESS (GENERALIZED) 09/27/2009   IBS 12/07/2008   Weakness 07/21/2008   MIGRAINE WITH AURA 08/13/2007   OSTEOARTHRITIS 08/13/2007   TRANSIENT ISCHEMIC ATTACK 04/13/2007   Hyperlipidemia LDL goal <100 11/25/2006   CAD (coronary artery disease) 11/25/2006   GERD 11/25/2006   DIVERTICULITIS, HX OF 11/25/2006    Past Surgical History:  Procedure Laterality Date   ABDOMINAL HYSTERECTOMY  1973   CATARACT EXTRACTION W/ INTRAOCULAR LENS  IMPLANT, BILATERAL Bilateral 1999   CHOLECYSTECTOMY N/A 01/03/2015   Procedure: LAPAROSCOPIC CHOLECYSTECTOMY WITH INTRAOPERATIVE  CHOLANGIOGRAM;  Surgeon: Donnie Mesa, MD;  Location: [facility];  Service: General;  Laterality: N/A;   CORONARY ANGIOPLASTY  1997   Dr Lia Foyer   CORONARY ARTERY BYPASS GRAFT  1997   Dr Melvenia Needles   LAPAROSCOPIC CHOLECYSTECTOMY  01/03/2015   TONSILLECTOMY       OB History   No obstetric history on file.     Family History  Problem Relation Age of Onset   Heart attack Mother    Heart failure Father    Throat cancer Sister    Heart disease Sister    Coronary artery disease Sister    Diabetes Sister    Heart attack Sister    Colon cancer Neg Hx     Social History   Tobacco Use   Smoking status: Never   Smokeless tobacco: Never  Vaping Use   Vaping Use: Never used  Substance Use Topics   Alcohol use: No   Drug use: No    Home Medications Prior to Admission medications   Medication Sig Start Date End Date Taking? Authorizing Provider  acetaminophen (TYLENOL) 325 MG tablet Take 650 mg by mouth every 6 (six) hours as needed for moderate pain.     [provider]  amiodarone (PACERONE) 200 MG tablet TAKE ONE-HALF TABLET DAILY 01/08/17   Gildardo Cranker, DO  bisacodyl (DULCOLAX) 10 MG suppository Place 10 mg rectally as needed for moderate constipation.    [provider]  DULoxetine (CYMBALTA) 60 MG capsule Take 1 capsule (60 mg total) by mouth daily. For mood and chronic pain 01/29/17   Gildardo Cranker, DO  ELIQUIS 5 MG TABS tablet Take 1 tablet (5 mg total) by mouth 2 (two) times daily. 07/26/16   Josue Hector, MD  ezetimibe (ZETIA) 10 MG tablet Take 1 tablet (10 mg total) by mouth daily. 02/26/17   Dessa Phi, DO  losartan (COZAAR) 50 MG tablet TAKE ONE TABLET TWICE DAILY 09/11/16   Josue Hector, MD  magnesium hydroxide (MILK OF MAGNESIA) 400 MG/5ML suspension Take 30 mLs by mouth daily as needed for mild constipation.    [provider]  meclizine (ANTIVERT) 12.5 MG tablet Take 12.5 mg by mouth every 8 (eight) hours as needed for dizziness.      [provider]  Multiple Vitamin (MULTIVITAMIN WITH MINERALS) TABS tablet Take 1 tablet by mouth daily.    [provider]  Multiple Vitamins-Minerals (PRESERVISION AREDS) TABS Take 1 tablet by mouth daily.     [provider]  niacin 500 MG CR capsule Take 500 mg by mouth 2 (two) times daily.     [provider]  pantoprazole (PROTONIX) 40 MG tablet TAKE ONE TABLET TWICE DAILY 09/17/16   Gildardo Cranker, DO  QUEtiapine (SEROQUEL) 25 MG tablet Take 1 tablet (25 mg total) by mouth at bedtime. 02/25/17   Dessa Phi, DO  sodium chloride (  OCEAN) 0.65 % SOLN nasal spray Place 1 spray into both nostrils at bedtime.     [provider]  Sodium Phosphates (RA SALINE ENEMA) 19-7 GM/118ML ENEM Place 118 mLs rectally daily as needed (FOR CONSTIPATION).    [provider]  Tetrahydrozoline HCl (VISINE OP) Place 2 drops into both eyes at bedtime.     [provider]  sertraline (ZOLOFT) 25 MG tablet Take 1 tablet (25 mg total) by mouth daily. 02/14/11 10/19/11  Marletta Lor, MD    Allergies    Asa [aspirin], Crestor [rosuvastatin calcium], Hydrocodone, Ibuprofen, Oxycodone hcl, Penicillins, Prednisone, Statins, Morphine and related, and Pristiq [desvenlafaxine]  Review of Systems   Review of Systems  Constitutional:  Negative for fever.  Gastrointestinal:  Negative for abdominal pain.  Musculoskeletal:  Positive for arthralgias.  Psychiatric/Behavioral:  Negative for confusion.   All other systems reviewed and are negative.  Physical Exam Updated Vital Signs BP 103/83   Pulse (!) 108   Resp (!) 21   SpO2 97%   Physical Exam Vitals and nursing note reviewed.  Constitutional:      Appearance: She is not ill-appearing or diaphoretic.  HENT:     Head: Normocephalic and atraumatic.     Comments: No raccoon's sign or battle's sign.  Eyes:     Extraocular Movements: Extraocular movements intact.     Conjunctiva/sclera:  Conjunctivae normal.     Pupils: Pupils are equal, round, and reactive to light.  Neck:     Comments: Mild midline and bilateral paracervical musculature TTP.  Cardiovascular:     Rate and Rhythm: Normal rate and regular rhythm.     Pulses: Normal pulses.  Pulmonary:     Effort: Pulmonary effort is normal.     Breath sounds: Normal breath sounds. No wheezing, rhonchi or rales.  Abdominal:     Palpations: Abdomen is soft.     Tenderness: There is no abdominal tenderness. There is no guarding or rebound.  Musculoskeletal:     Cervical back: Neck supple. Tenderness present.     Comments: No T or L midline spinal TTP.  Mild TTP to R paralumbar musculature/posterior aspect of R hip. No external rotation or leg shortening. No pain illicited with ROM of R hip. Strength and sensation intact throughout. 2+ DP pulses.   + TTP to R lateral ribs; no crepitus appreciated.   Skin:    General: Skin is warm and dry.  Neurological:     Mental Status: She is alert and oriented to person, place, and time.    ED Results / Procedures / Treatments   Labs (all labs ordered are listed, but only abnormal results are displayed) Labs Reviewed  COMPREHENSIVE METABOLIC PANEL - Abnormal; Notable for the following components:      Result Value   Sodium 134 (*)    Albumin 3.4 (*)    All other components within normal limits  PROTIME-INR - Abnormal; Notable for the following components:   Prothrombin Time 15.4 (*)    All other components within normal limits  CBC WITH DIFFERENTIAL/PLATELET  CK    EKG EKG Interpretation  Date/Time:  Sunday February 25 2021 17:59:34 EDT Ventricular Rate:  99 PR Interval:  187 QRS Duration: 84 QT Interval:  363 QTC Calculation: 432 R Axis:   9 Text Interpretation: Atrial fibrillation Low voltage, precordial leads Repol abnrm suggests ischemia, anterolateral Confirmed by Davonna Belling [phone]) on 02/25/2021 6:13:55 PM  Radiology DG Ribs Unilateral W/Chest  Right  Result  Date: 02/25/2021 CLINICAL DATA:  Rib pain, status post fall EXAM: RIGHT RIBS AND CHEST - 3+ VIEW COMPARISON:  None. FINDINGS: Lungs are clear.  No pleural effusion or pneumothorax. The heart is top-normal in size. Postsurgical changes related to prior CABG. Thoracic aortic atherosclerosis. Median sternotomy. No displaced right rib fracture is seen. IMPRESSION: Negative. Electronically Signed   By: Julian Hy M.D.   On: 02/25/2021 19:16   CT Head Wo Contrast  Result Date: 02/25/2021 CLINICAL DATA:  Fall. EXAM: CT HEAD WITHOUT CONTRAST TECHNIQUE: Contiguous axial images were obtained from the base of the skull through the vertex without intravenous contrast. COMPARISON:  Head CT 10 days ago 02/15/2021 FINDINGS: Brain: No evidence of acute infarction, hemorrhage, hydrocephalus, extra-axial collection or mass lesion/mass effect. Stable degree of atrophy and advanced chronic small vessel ischemia. Remote lacunar infarct in the right thalamus. Vascular: Atherosclerosis of skullbase vasculature without hyperdense vessel or abnormal calcification. Skull: No fracture or focal lesion. Sinuses/Orbits: No acute findings. None mild mucosal thickening of right maxillary sinus unchanged. Scattered opacification of ethmoid air cells. No mastoid effusion bilateral cataract resection. Other: No acute scalp contusion, IMPRESSION: 1. No acute intracranial abnormality. No skull fracture. 2. Stable atrophy and advanced chronic small vessel ischemia. Electronically Signed   By: Keith Rake M.D.   On: 02/25/2021 18:45   CT Cervical Spine Wo Contrast  Result Date: 02/25/2021 CLINICAL DATA:  Fall. EXAM: CT CERVICAL SPINE WITHOUT CONTRAST TECHNIQUE: Multidetector CT imaging of the cervical spine was performed without intravenous contrast. Multiplanar CT image reconstructions were also generated. COMPARISON:  Cervical spine CT 10 days ago 02/15/2021 FINDINGS: Alignment: No traumatic subluxation. Stable  grade 1 anterolisthesis of C4 on C5 in trace retrolisthesis of C7 on T1. Skull base and vertebrae: No acute fracture. Vertebral body heights are maintained. The dens and skull base are intact. Soft tissues and spinal canal: No prevertebral fluid or swelling. No visible canal hematoma. Disc levels: Stable multilevel degenerative disc disease and facet hypertrophy. Upper chest: Nonacute. Other: None. IMPRESSION: 1. No acute fracture or subluxation of the cervical spine. 2. Stable multilevel degenerative disc disease and facet hypertrophy. Electronically Signed   By: Keith Rake M.D.   On: 02/25/2021 18:41   DG Hip Unilat W or Wo Pelvis 2-3 Views Right  Result Date: 02/25/2021 CLINICAL DATA:  Fall EXAM: DG HIP (WITH OR WITHOUT PELVIS) 2-3V RIGHT COMPARISON:  None. FINDINGS: No fracture or dislocation is seen. Bilateral hip joint spaces are preserved. Visualized bony pelvis appears intact. IMPRESSION: Negative. Electronically Signed   By: Julian Hy M.D.   On: 02/25/2021 19:16    Procedures Procedures   Medications Ordered in ED Medications - No data to display  ED Course  I have reviewed the triage vital signs and the nursing notes.  Pertinent labs & imaging results that were available during my care of the patient were reviewed by me and considered in my medical decision making (see chart for details).    MDM Rules/Calculators/A&P                           87 year old female who presents to the ED today status post fall that occurred while at [facility].  She believes it was mechanical in nature, states her legs twisted underneath her causing her to fall.  Unknown downtime per EMS, she had to crawl from the bathroom to her bedroom to call for help.  Currently only complaining of right lateral rib  pain.  Patient is anticoagulated.  There are no signs of head trauma at this time however patient is unsure if she hit her head.  We will plan for CT head, CT C-spine at this time given age.   We will also plan for right rib x-ray.  She has mild tenderness palpation to the posterior aspect of her right hip, will plan for x-ray of same as well.  Remainder of skeletal exam is reassuring.  We will plan for lab work at this time including CK given unknown downtime however patient does not believe it was more than 2 hours.   CT head and CT C spine without acute findings Xray hip negative Xray ribs negative  Labwork reassuring without abnormalities. CK normal.  On reevaluation pt sitting upright in bed. States she is ready to go home. Family member at bedside will take her back to [place]. Stable for discharge at this time. Advised to take Tylenol as needed for pain. She is in agreement with plan and stable for discharge.   This note was prepared using Dragon voice recognition software and may include unintentional dictation errors due to the inherent limitations of voice recognition software.   Final Clinical Impression(s) / ED Diagnoses Final diagnoses:  Fall, initial encounter  Rib pain    Rx / DC Orders ED Discharge Orders     None        Discharge Instructions      Your images did not show any signs of fractures or other abnormalities at this time. Please take Tylenol as needed for pain.   Follow up with your PCP for further evaluation  Return to the ED for any new/worsening symptoms       Eustaquio Maize, Hershal Coria 02/25/21 2101    Davonna Belling, MD 02/25/21 2310

## 2021-03-28 NOTE — Progress Notes (Incomplete)
CARDIOLOGY CONSULT NOTE       Patient ID: Marissa Bowen MRN: [id number] DOB/AGE: 1926-07-19 87 y.o.  Admit date: (Not on file) Referring Physician: [facility] Primary Physician: System, Provider Not In Primary Cardiologist: New Reason for Consultation: Afib  Active Problems:   * No active hospital problems. *   HPI:  87 y.o. referred by Dr Domingo Cocking for afib. History of anemia, anxiety, HTN, HLD , CAD with remote CABG in 1997 , CVA with thalamic infarct in 2011 and TIA in 2008 Also has history of gastric ulcer GERD GI bleed in 2011 Her afib is chronic and she has been on amiodarone and eliquis At one time she was on Multaq but changed to amiodarone due to cost. CHADVASC 6 She has had laser Rx for bad veins in legs with venous insufficiency   She was seen in ER 02/25/21 for mechanical fall in bathroom Xrays of hip, ribs, c spine and CT head all negative   Last carotid duplex 02/24/17 1-39% bilateral dx TTE 02/22/17 EF 60-65% no valve dx  ***  ROS All other systems reviewed and negative except as noted above  Past Medical History:  Diagnosis Date   Anemia    Anisocoria 02/27/2017   Sees Dr Rutherford Guys   Anxiety    BACK PAIN, LUMBAR 09/27/2009   CAD (coronary artery disease)    a. s/p remote CABG 1997, 3V.;  b. LexiScan Myoview (8/15): No ischemia, EF 72%, normal study;  c. Echo 12/13 - EF 60-65%, no significant valvular abnormalities, normal RV size and systolic function.     CVA (cerebral infarction)    a. Thalamic infarct 09/2009. //  b. hx of TIA in 2008   Gastric ulcer    GERD 11/25/2006   GI bleed    a. 09/2009: secondary to antral ulcer.   Hiatal hernia    History of diverticulitis Dx 02/2012   History of MI (myocardial infarction)    HYPERLIPIDEMIA    HYPERTENSION    IBS 12/07/2008   MIGRAINE WITH AURA 08/13/2007   Mild carotid artery disease (HCC)    a. 0-39% bilat 05/2011 (f/u recommended 05/2013). //  b. Carotid US 3/17 -  Bilat ICA 1-39% >> FU prn   Muscle weakness (generalized) 09/27/2009   OSTEOARTHRITIS 08/13/2007   PAF (paroxysmal atrial fibrillation) ([place]) 11/25/2006   a. Multaq >> changes to Amiodarone 2/2 $$  //  Eliquis (CHADS2-VASc = 6)   Urinary, incontinence, stress female    wears Pessary    Family History  Problem Relation Age of Onset   Heart attack Mother    Heart failure Father    Throat cancer Sister    Heart disease Sister    Coronary artery disease Sister    Diabetes Sister    Heart attack Sister    Colon cancer Neg Hx     Social History   Socioeconomic History   Marital status: Widowed    Spouse name: Not on file   Number of children: Not on file   Years of education: Not on file   Highest education level: Not on file  Occupational History   Not on file  Tobacco Use   Smoking status: Never   Smokeless tobacco: Never  Vaping Use   Vaping Use: Never used  Substance and Sexual Activity   Alcohol use: No   Drug use: No   Sexual activity: Never  Other Topics Concern   Not on file  Social History  Narrative   Not on file   Social Determinants of Health   Financial Resource Strain: Not on file  Food Insecurity: Not on file  Transportation Needs: Not on file  Physical Activity: Not on file  Stress: Not on file  Social Connections: Not on file  Intimate Partner Violence: Not on file    Past Surgical History:  Procedure Laterality Date   ABDOMINAL HYSTERECTOMY  1973   CATARACT EXTRACTION W/ INTRAOCULAR LENS  IMPLANT, BILATERAL Bilateral 1999   CHOLECYSTECTOMY N/A 01/03/2015   Procedure: LAPAROSCOPIC CHOLECYSTECTOMY WITH INTRAOPERATIVE CHOLANGIOGRAM;  Surgeon: Donnie Mesa, MD;  Location: [facility];  Service: General;  Laterality: N/A;   CORONARY ANGIOPLASTY  1997   Dr Lia Foyer   CORONARY ARTERY BYPASS GRAFT  1997   Dr Melvenia Needles   LAPAROSCOPIC CHOLECYSTECTOMY  01/03/2015   TONSILLECTOMY        Current Outpatient Medications:     acetaminophen (TYLENOL) 325 MG tablet, Take 650 mg by mouth every 6 (six) hours as needed for moderate pain. , Disp: , Rfl:    amiodarone (PACERONE) 200 MG tablet, TAKE ONE-HALF TABLET DAILY, Disp: 15 tablet, Rfl: 5   bisacodyl (DULCOLAX) 10 MG suppository, Place 10 mg rectally as needed for moderate constipation., Disp: , Rfl:    DULoxetine (CYMBALTA) 60 MG capsule, Take 1 capsule (60 mg total) by mouth daily. For mood and chronic pain, Disp: 30 capsule, Rfl: 6   ELIQUIS 5 MG TABS tablet, Take 1 tablet (5 mg total) by mouth 2 (two) times daily., Disp: 180 tablet, Rfl: 3   ezetimibe (ZETIA) 10 MG tablet, Take 1 tablet (10 mg total) by mouth daily., Disp: 30 tablet, Rfl: 0   losartan (COZAAR) 50 MG tablet, TAKE ONE TABLET TWICE DAILY, Disp: 60 tablet, Rfl: 6   magnesium hydroxide (MILK OF MAGNESIA) 400 MG/5ML suspension, Take 30 mLs by mouth daily as needed for mild constipation., Disp: , Rfl:    meclizine (ANTIVERT) 12.5 MG tablet, Take 12.5 mg by mouth every 8 (eight) hours as needed for dizziness. , Disp: , Rfl:    Multiple Vitamin (MULTIVITAMIN WITH MINERALS) TABS tablet, Take 1 tablet by mouth daily., Disp: , Rfl:    Multiple Vitamins-Minerals (PRESERVISION AREDS) TABS, Take 1 tablet by mouth daily. , Disp: , Rfl:    niacin 500 MG CR capsule, Take 500 mg by mouth 2 (two) times daily. , Disp: , Rfl:    pantoprazole (PROTONIX) 40 MG tablet, TAKE ONE TABLET TWICE DAILY, Disp: 180 tablet, Rfl: 2   QUEtiapine (SEROQUEL) 25 MG tablet, Take 1 tablet (25 mg total) by mouth at bedtime., Disp: 10 tablet, Rfl: 0   sodium chloride (OCEAN) 0.65 % SOLN nasal spray, Place 1 spray into both nostrils at bedtime. , Disp: , Rfl:    Sodium Phosphates (RA SALINE ENEMA) 19-7 GM/118ML ENEM, Place 118 mLs rectally daily as needed (FOR CONSTIPATION)., Disp: , Rfl:    Tetrahydrozoline HCl (VISINE OP), Place 2 drops into both eyes at bedtime. , Disp: , Rfl:     Physical Exam: There were no vitals taken  for this visit. *** HELP TEXT ***  This SmartLink requires parameters. Parameters are variables that are added to the [facility] name to request specific information. The parameter for .curwt is the number of encounters to display readings from.  For example: .curwt[4  In this example, the SmartLink displays readings from the last four encounters.    {physical [id number]}  Labs:   Lab Results  Component Value Date  WBC 7.9 02/25/2021   HGB 12.7 02/25/2021   HCT 39.9 02/25/2021   MCV 96.4 02/25/2021   PLT 213 02/25/2021   No results for input(s): NA, K, CL, CO2, BUN, CREATININE, CALCIUM, PROT, BILITOT, ALKPHOS, ALT, AST, GLUCOSE in the last 168 hours.  Invalid input(s): LABALBU Lab Results  Component Value Date   CKTOTAL 85 02/25/2021   CKMB 2.1 10/09/2009   TROPONINI <0.03 10/05/2016    Lab Results  Component Value Date   CHOL 191 02/22/2017   CHOL 175 01/29/2016   CHOL 228 (H) 12/27/2015   Lab Results  Component Value Date   HDL 65 02/22/2017   HDL 50 01/29/2016   HDL 62 12/27/2015   Lab Results  Component Value Date   LDLCALC 110 (H) 02/22/2017   LDLCALC 103 01/29/2016   LDLCALC 138 (H) 12/27/2015   Lab Results  Component Value Date   TRIG 82 02/22/2017   TRIG 111 01/29/2016   TRIG 142 12/27/2015   Lab Results  Component Value Date   CHOLHDL 2.9 02/22/2017   CHOLHDL 3.5 01/29/2016   CHOLHDL 3.7 12/27/2015   No results found for: LDLDIRECT    Radiology: No results found.  EKG: 103-22 AFib rate 72 nonspecific ST changes    ASSESSMENT AND PLAN:   Afib:  chronic rate control is fine has been on full dose eliquis GFR > 60 labs in ER 02/25/21 and weight > 60 kg *** Behavioral:  history of dementia and mood changes on  Cymbalta andSeroquel *** GERD:  on protonix also history of antral ulcer and GI bleeding but has tolerated eliquis for a long time *** CABG:  remote 1997 no chest pain given age no testing needed  5.  CVA:  [place] VASC 7 with ? Two  previous strokes ***  ***   Signed: [name] 03/28/2021, 7:09 PM

## 2021-04-02 ENCOUNTER — Encounter (HOSPITAL_COMMUNITY): Payer: Self-pay | Admitting: Emergency Medicine

## 2021-04-02 ENCOUNTER — Emergency Department (HOSPITAL_COMMUNITY): Payer: Medicare Other

## 2021-04-02 ENCOUNTER — Other Ambulatory Visit: Payer: Self-pay

## 2021-04-02 ENCOUNTER — Inpatient Hospital Stay (HOSPITAL_COMMUNITY)
Admission: EM | Admit: 2021-04-02 | Discharge: 2021-04-05 | DRG: 177 | Disposition: A | Discharge status: Skilled Nursing Facility | Payer: Medicare Other | Source: Skilled Nursing Facility | Attending: Internal Medicine | Admitting: Internal Medicine

## 2021-04-02 DIAGNOSIS — Z8673 Personal history of transient ischemic attack (TIA), and cerebral infarction without residual deficits: Secondary | ICD-10-CM

## 2021-04-02 DIAGNOSIS — I5032 Chronic diastolic (congestive) heart failure: Secondary | ICD-10-CM | POA: Diagnosis present

## 2021-04-02 DIAGNOSIS — R0602 Shortness of breath: Secondary | ICD-10-CM

## 2021-04-02 DIAGNOSIS — R7989 Other specified abnormal findings of blood chemistry: Secondary | ICD-10-CM | POA: Diagnosis present

## 2021-04-02 DIAGNOSIS — Z8711 Personal history of peptic ulcer disease: Secondary | ICD-10-CM | POA: Diagnosis not present

## 2021-04-02 DIAGNOSIS — Z9861 Coronary angioplasty status: Secondary | ICD-10-CM

## 2021-04-02 DIAGNOSIS — A419 Sepsis, unspecified organism: Secondary | ICD-10-CM | POA: Diagnosis present

## 2021-04-02 DIAGNOSIS — I493 Ventricular premature depolarization: Secondary | ICD-10-CM | POA: Diagnosis present

## 2021-04-02 DIAGNOSIS — I252 Old myocardial infarction: Secondary | ICD-10-CM

## 2021-04-02 DIAGNOSIS — Z66 Do not resuscitate: Secondary | ICD-10-CM | POA: Diagnosis not present

## 2021-04-02 DIAGNOSIS — F419 Anxiety disorder, unspecified: Secondary | ICD-10-CM | POA: Diagnosis present

## 2021-04-02 DIAGNOSIS — R6520 Severe sepsis without septic shock: Secondary | ICD-10-CM

## 2021-04-02 DIAGNOSIS — Z833 Family history of diabetes mellitus: Secondary | ICD-10-CM

## 2021-04-02 DIAGNOSIS — N390 Urinary tract infection, site not specified: Secondary | ICD-10-CM | POA: Diagnosis present

## 2021-04-02 DIAGNOSIS — Z880 Allergy status to penicillin: Secondary | ICD-10-CM

## 2021-04-02 DIAGNOSIS — N393 Stress incontinence (female) (male): Secondary | ICD-10-CM | POA: Diagnosis present

## 2021-04-02 DIAGNOSIS — I4891 Unspecified atrial fibrillation: Secondary | ICD-10-CM

## 2021-04-02 DIAGNOSIS — E860 Dehydration: Secondary | ICD-10-CM | POA: Diagnosis present

## 2021-04-02 DIAGNOSIS — J1282 Pneumonia due to coronavirus disease 2019: Secondary | ICD-10-CM | POA: Diagnosis present

## 2021-04-02 DIAGNOSIS — G9341 Metabolic encephalopathy: Secondary | ICD-10-CM | POA: Diagnosis present

## 2021-04-02 DIAGNOSIS — Z885 Allergy status to narcotic agent status: Secondary | ICD-10-CM

## 2021-04-02 DIAGNOSIS — Z7901 Long term (current) use of anticoagulants: Secondary | ICD-10-CM

## 2021-04-02 DIAGNOSIS — Z8249 Family history of ischemic heart disease and other diseases of the circulatory system: Secondary | ICD-10-CM

## 2021-04-02 DIAGNOSIS — J9601 Acute respiratory failure with hypoxia: Secondary | ICD-10-CM | POA: Diagnosis present

## 2021-04-02 DIAGNOSIS — Z886 Allergy status to analgesic agent status: Secondary | ICD-10-CM

## 2021-04-02 DIAGNOSIS — I110 Hypertensive heart disease with heart failure: Secondary | ICD-10-CM | POA: Diagnosis present

## 2021-04-02 DIAGNOSIS — U071 COVID-19: Principal | ICD-10-CM | POA: Diagnosis present

## 2021-04-02 DIAGNOSIS — N19 Unspecified kidney failure: Secondary | ICD-10-CM | POA: Diagnosis present

## 2021-04-02 DIAGNOSIS — I480 Paroxysmal atrial fibrillation: Secondary | ICD-10-CM | POA: Diagnosis present

## 2021-04-02 DIAGNOSIS — Z951 Presence of aortocoronary bypass graft: Secondary | ICD-10-CM

## 2021-04-02 DIAGNOSIS — K219 Gastro-esophageal reflux disease without esophagitis: Secondary | ICD-10-CM | POA: Diagnosis present

## 2021-04-02 DIAGNOSIS — Z888 Allergy status to other drugs, medicaments and biological substances status: Secondary | ICD-10-CM

## 2021-04-02 DIAGNOSIS — F0390 Unspecified dementia without behavioral disturbance: Secondary | ICD-10-CM | POA: Diagnosis present

## 2021-04-02 DIAGNOSIS — R4182 Altered mental status, unspecified: Secondary | ICD-10-CM | POA: Diagnosis present

## 2021-04-02 DIAGNOSIS — E785 Hyperlipidemia, unspecified: Secondary | ICD-10-CM

## 2021-04-02 DIAGNOSIS — Z808 Family history of malignant neoplasm of other organs or systems: Secondary | ICD-10-CM | POA: Diagnosis not present

## 2021-04-02 DIAGNOSIS — Z889 Allergy status to unspecified drugs, medicaments and biological substances status: Secondary | ICD-10-CM

## 2021-04-02 DIAGNOSIS — Z90710 Acquired absence of both cervix and uterus: Secondary | ICD-10-CM

## 2021-04-02 LAB — CBC WITH DIFFERENTIAL/PLATELET
Abs Immature Granulocytes: 0.04 10*3/uL (ref 0.00–0.07)
Basophils Absolute: 0 10*3/uL (ref 0.0–0.1)
Basophils Relative: 0 %
Eosinophils Absolute: 0.2 10*3/uL (ref 0.0–0.5)
Eosinophils Relative: 2 %
HCT: 41.7 % (ref 36.0–46.0)
Hemoglobin: 13.1 g/dL (ref 12.0–15.0)
Immature Granulocytes: 0 %
Lymphocytes Relative: 13 %
Lymphs Abs: 1.6 10*3/uL (ref 0.7–4.0)
MCH: 30 pg (ref 26.0–34.0)
MCHC: 31.4 g/dL (ref 30.0–36.0)
MCV: 95.4 fL (ref 80.0–100.0)
Monocytes Absolute: 1.5 10*3/uL — ABNORMAL HIGH (ref 0.1–1.0)
Monocytes Relative: 12 %
Neutro Abs: 8.9 10*3/uL — ABNORMAL HIGH (ref 1.7–7.7)
Neutrophils Relative %: 73 %
Platelets: 303 10*3/uL (ref 150–400)
RBC: 4.37 MIL/uL (ref 3.87–5.11)
RDW: 13.8 % (ref 11.5–15.5)
WBC: 12.2 10*3/uL — ABNORMAL HIGH (ref 4.0–10.5)
nRBC: 0 % (ref 0.0–0.2)

## 2021-04-02 LAB — MAGNESIUM: Magnesium: 2.1 mg/dL (ref 1.7–2.4)

## 2021-04-02 LAB — URINALYSIS, ROUTINE W REFLEX MICROSCOPIC
Bilirubin Urine: NEGATIVE
Glucose, UA: NEGATIVE mg/dL
Ketones, ur: 40 mg/dL — AB
Leukocytes,Ua: NEGATIVE
Nitrite: NEGATIVE
Protein, ur: 30 mg/dL — AB
Specific Gravity, Urine: 1.025 (ref 1.005–1.030)
pH: 6 (ref 5.0–8.0)

## 2021-04-02 LAB — D-DIMER, QUANTITATIVE: D-Dimer, Quant: 0.6 ug/mL-FEU — ABNORMAL HIGH (ref 0.00–0.50)

## 2021-04-02 LAB — COMPREHENSIVE METABOLIC PANEL
ALT: 20 U/L (ref 0–44)
AST: 22 U/L (ref 15–41)
Albumin: 3.1 g/dL — ABNORMAL LOW (ref 3.5–5.0)
Alkaline Phosphatase: 68 U/L (ref 38–126)
Anion gap: 10 (ref 5–15)
BUN: 19 mg/dL (ref 8–23)
CO2: 29 mmol/L (ref 22–32)
Calcium: 9.2 mg/dL (ref 8.9–10.3)
Chloride: 98 mmol/L (ref 98–111)
Creatinine, Ser: 0.74 mg/dL (ref 0.44–1.00)
GFR, Estimated: >60 mL/min (ref >60)
Glucose, Bld: 124 mg/dL — ABNORMAL HIGH (ref 70–99)
Potassium: 4.3 mmol/L (ref 3.5–5.1)
Sodium: 137 mmol/L (ref 135–145)
Total Bilirubin: 1.1 mg/dL (ref 0.3–1.2)
Total Protein: 6.9 g/dL (ref 6.5–8.1)

## 2021-04-02 LAB — URINALYSIS, MICROSCOPIC (REFLEX): RBC / HPF: >50 RBC/hpf (ref 0–5)

## 2021-04-02 LAB — LACTATE DEHYDROGENASE: LDH: 168 U/L (ref 98–192)

## 2021-04-02 LAB — RESP PANEL BY RT-PCR (FLU A&B, COVID) ARPGX2
Influenza A by PCR: NEGATIVE
Influenza B by PCR: NEGATIVE
SARS Coronavirus 2 by RT PCR: POSITIVE — AB

## 2021-04-02 LAB — LACTIC ACID, PLASMA: Lactic Acid, Venous: 1.2 mmol/L (ref 0.5–1.9)

## 2021-04-02 LAB — CBG MONITORING, ED: Glucose-Capillary: 116 mg/dL — ABNORMAL HIGH (ref 70–99)

## 2021-04-02 LAB — FIBRINOGEN: Fibrinogen: 716 mg/dL — ABNORMAL HIGH (ref 210–475)

## 2021-04-02 MED ORDER — DILTIAZEM LOAD VIA INFUSION
20.0000 mg | Freq: Once | INTRAVENOUS | Status: DC
  Filled 2021-04-02: qty 20

## 2021-04-02 MED ORDER — DILTIAZEM HCL 25 MG/5ML IV SOLN
20.0000 mg | Freq: Once | INTRAVENOUS | Status: AC
  Administered 2021-04-02: 20 mg via INTRAVENOUS
  Filled 2021-04-02: qty 5

## 2021-04-02 MED ORDER — ALBUTEROL SULFATE (2.5 MG/3ML) 0.083% IN NEBU: 2.5000 mg | INHALATION_SOLUTION | RESPIRATORY_TRACT | Status: DC | PRN

## 2021-04-02 MED ORDER — SODIUM CHLORIDE 0.9 % IV SOLN
INTRAVENOUS | Status: DC
Start: 2021-04-02 — End: 2021-04-02

## 2021-04-02 MED ORDER — SODIUM CHLORIDE 0.9 % IV BOLUS
500.0000 mL | Freq: Once | INTRAVENOUS | Status: AC
  Administered 2021-04-02: 500 mL via INTRAVENOUS

## 2021-04-02 MED ORDER — ACETAMINOPHEN 325 MG PO TABS: 650.0000 mg | ORAL_TABLET | Freq: Four times a day (QID) | ORAL | Status: DC | PRN

## 2021-04-02 MED ORDER — SODIUM CHLORIDE 0.9 % IV SOLN: INTRAVENOUS | Status: DC

## 2021-04-02 MED ORDER — SODIUM CHLORIDE 0.9 % IV SOLN
100.0000 mg | Freq: Every day | INTRAVENOUS | Status: DC
  Filled 2021-04-02 (×3): qty 20

## 2021-04-02 MED ORDER — SODIUM CHLORIDE 0.9 % IV SOLN
200.0000 mg | Freq: Once | INTRAVENOUS | Status: AC
  Administered 2021-04-02: 200 mg via INTRAVENOUS
  Filled 2021-04-02: qty 40

## 2021-04-02 MED ORDER — ACETAMINOPHEN 650 MG RE SUPP: 650.0000 mg | Freq: Four times a day (QID) | RECTAL | Status: DC | PRN

## 2021-04-02 NOTE — ED Provider Notes (Signed)
[facility] EMERGENCY DEPARTMENT Provider Note   CSN: [id number] Arrival date & time: 04/02/21  1800     History Chief Complaint  Patient presents with   Altered Mental Status    Marissa Bowen is a 87 y.o. female.  Pt presents to the ED today with AMS.  The pt has dementia.  Pt said she feels weak.  No pain.  Pt's daughter said pt has been confused since 11/26, but it is getting worse.  Pt went to the urologist on 11/30 to be checked for a UTI.  That was negative.  Pt is on Eliquis for hx afib.  Pt's daughter said the confusion has been worse in the last 2 days.  Pt has not been eating or drinking much.  She has not been getting up and moving around.  She is from assisted living and normally does a lot for herself.  CHA2DS2/VAS Stroke Risk Points  Current as of 10 minutes ago     7 >= 2 Points: High Risk  1 - 1.99 Points: Medium Risk  0 Points: Low Risk    Last Change: N/A      Details    This score determines the patient's risk of having a stroke if the  patient has atrial fibrillation.       Points Metrics  0 Has Congestive Heart Failure:  No    Current as of 10 minutes ago  1 Has Vascular Disease:  Yes    Current as of 10 minutes ago  1 Has Hypertension:  Yes    Current as of 10 minutes ago  2 Age:  62    Current as of 10 minutes ago  0 Has Diabetes:  No    Current as of 10 minutes ago  2 Had Stroke:  No  Had TIA:  Yes  Had Thromboembolism:  No    Current as of 10 minutes ago  1 Female:  Yes    Current as of 10 minutes ago                Past Medical History:  Diagnosis Date   Anemia    Anisocoria 02/27/2017   Sees Dr Rutherford Guys   Anxiety    BACK PAIN, LUMBAR 09/27/2009   CAD (coronary artery disease)    a. s/p remote CABG 1997, 3V.;  b. LexiScan Myoview (8/15): No ischemia, EF 72%, normal study;  c. Echo 12/13 - EF 60-65%, no significant valvular abnormalities, normal RV size and systolic function.     CVA (cerebral infarction)     a. Thalamic infarct 09/2009. //  b. hx of TIA in 2008   Gastric ulcer    GERD 11/25/2006   GI bleed    a. 09/2009: secondary to antral ulcer.   Hiatal hernia    History of diverticulitis Dx 02/2012   History of MI (myocardial infarction)    HYPERLIPIDEMIA    HYPERTENSION    IBS 12/07/2008   MIGRAINE WITH AURA 08/13/2007   Mild carotid artery disease (HCC)    a. 0-39% bilat 05/2011 (f/u recommended 05/2013). //  b. Carotid US 3/17 - Bilat ICA 1-39% >> FU prn   Muscle weakness (generalized) 09/27/2009   OSTEOARTHRITIS 08/13/2007   PAF (paroxysmal atrial fibrillation) ([place]) 11/25/2006   a. Multaq >> changes to Amiodarone 2/2 $$  //  Eliquis (CHADS2-VASc = 6)   Urinary, incontinence, stress female    wears Pessary    Patient Active Problem List  Diagnosis Date Noted   Hallucinations 03/11/2017   Essential tremor 03/11/2017   Prolonged Q-T interval on ECG 02/28/2017   Recurrent falls 02/27/2017   Epistaxis 02/27/2017   TIA (transient ischemic attack) 02/21/2017   GAD (generalized anxiety disorder) 11/19/2016   Diverticulitis 10/05/2016   Elevated LFTs 10/04/2016   SIRS (systemic inflammatory response syndrome) ([place]) 10/04/2016   Macular degeneration of both eyes 09/22/2015   Carotid stenosis 08/10/2015   On amiodarone therapy 08/10/2015   Chronic cholecystitis with calculus 01/03/2015   Allergic rhinitis 08/05/2014   Essential hypertension 05/03/2014   Osteoarthritis 09/29/2013   Routine general medical examination at a health care facility 09/29/2013   Depression with anxiety 08/03/2013   Atrial fibrillation ([place]) 09/02/2012   Anemia due to chronic blood loss 08/18/2012   Diarrhea 06/08/2012   Postural hypotension 06/08/2012   ACUTE POSTHEMORRHAGIC ANEMIA 10/23/2009   BACK PAIN, LUMBAR 09/27/2009   MUSCLE WEAKNESS (GENERALIZED) 09/27/2009   IBS 12/07/2008   Weakness 07/21/2008   MIGRAINE WITH AURA 08/13/2007   OSTEOARTHRITIS 08/13/2007   TRANSIENT ISCHEMIC ATTACK 04/13/2007    Hyperlipidemia LDL goal <100 11/25/2006   CAD (coronary artery disease) 11/25/2006   GERD 11/25/2006   DIVERTICULITIS, HX OF 11/25/2006    Past Surgical History:  Procedure Laterality Date   ABDOMINAL HYSTERECTOMY  1973   CATARACT EXTRACTION W/ INTRAOCULAR LENS  IMPLANT, BILATERAL Bilateral 1999   CHOLECYSTECTOMY N/A 01/03/2015   Procedure: LAPAROSCOPIC CHOLECYSTECTOMY WITH INTRAOPERATIVE CHOLANGIOGRAM;  Surgeon: Donnie Mesa, MD;  Location: [facility] [facility];  Service: General;  Laterality: N/A;   CORONARY ANGIOPLASTY  1997   Dr Lia Foyer   CORONARY ARTERY BYPASS GRAFT  1997   Dr Melvenia Needles   LAPAROSCOPIC CHOLECYSTECTOMY  01/03/2015   TONSILLECTOMY       OB History   No obstetric history on file.     Family History  Problem Relation Age of Onset   Heart attack Mother    Heart failure Father    Throat cancer Sister    Heart disease Sister    Coronary artery disease Sister    Diabetes Sister    Heart attack Sister    Colon cancer Neg Hx     Social History   Tobacco Use   Smoking status: Never   Smokeless tobacco: Never  Vaping Use   Vaping Use: Never used  Substance Use Topics   Alcohol use: No   Drug use: No    Home Medications Prior to Admission medications   Medication Sig Start Date End Date Taking? Authorizing Provider  acetaminophen (TYLENOL) 325 MG tablet Take 650 mg by mouth every 6 (six) hours as needed for moderate pain.     [provider]  amiodarone (PACERONE) 200 MG tablet TAKE ONE-HALF TABLET DAILY 01/08/17   Gildardo Cranker, DO  bisacodyl (DULCOLAX) 10 MG suppository Place 10 mg rectally as needed for moderate constipation.    [provider]  DULoxetine (CYMBALTA) 60 MG capsule Take 1 capsule (60 mg total) by mouth daily. For mood and chronic pain 01/29/17   Gildardo Cranker, DO  ELIQUIS 5 MG TABS tablet Take 1 tablet (5 mg total) by mouth 2 (two) times daily. 07/26/16   Josue Hector, MD  ezetimibe (ZETIA) 10 MG tablet Take 1 tablet (10 mg total)  by mouth daily. 02/26/17   Dessa Phi, DO  losartan (COZAAR) 50 MG tablet TAKE ONE TABLET TWICE DAILY 09/11/16   Josue Hector, MD  magnesium hydroxide (MILK OF MAGNESIA) 400 MG/5ML suspension Take  30 mLs by mouth daily as needed for mild constipation.    [provider]  meclizine (ANTIVERT) 12.5 MG tablet Take 12.5 mg by mouth every 8 (eight) hours as needed for dizziness.     [provider]  Multiple Vitamin (MULTIVITAMIN WITH MINERALS) TABS tablet Take 1 tablet by mouth daily.    [provider]  Multiple Vitamins-Minerals (PRESERVISION AREDS) TABS Take 1 tablet by mouth daily.     [provider]  niacin 500 MG CR capsule Take 500 mg by mouth 2 (two) times daily.     [provider]  pantoprazole (PROTONIX) 40 MG tablet TAKE ONE TABLET TWICE DAILY 09/17/16   Gildardo Cranker, DO  QUEtiapine (SEROQUEL) 25 MG tablet Take 1 tablet (25 mg total) by mouth at bedtime. 02/25/17   Dessa Phi, DO  sodium chloride (OCEAN) 0.65 % SOLN nasal spray Place 1 spray into both nostrils at bedtime.     [provider]  Sodium Phosphates (RA SALINE ENEMA) 19-7 GM/118ML ENEM Place 118 mLs rectally daily as needed (FOR CONSTIPATION).    [provider]  Tetrahydrozoline HCl (VISINE OP) Place 2 drops into both eyes at bedtime.     [provider]  sertraline (ZOLOFT) 25 MG tablet Take 1 tablet (25 mg total) by mouth daily. 02/14/11 10/19/11  Marletta Lor, MD    Allergies    Asa [aspirin], Crestor [rosuvastatin calcium], Hydrocodone, Ibuprofen, Oxycodone hcl, Penicillins, Prednisone, Statins, Morphine and related, and Pristiq [desvenlafaxine]  Review of Systems   Review of Systems  Neurological:  Negative for weakness.   Physical Exam Updated Vital Signs BP (!) 159/68   Pulse 85   Temp 98.2 F (36.8 C) (Oral)   Resp (!) 21   SpO2 90%   Physical Exam Vitals and nursing note reviewed.  Constitutional:      Appearance:  Normal appearance.  HENT:     Head: Normocephalic and atraumatic.     Right Ear: External ear normal.     Left Ear: External ear normal.     Nose: Nose normal.     Mouth/Throat:     Mouth: Mucous membranes are moist.     Pharynx: Oropharynx is clear.  Eyes:     Extraocular Movements: Extraocular movements intact.     Conjunctiva/sclera: Conjunctivae normal.     Pupils: Pupils are equal, round, and reactive to light.  Cardiovascular:     Rate and Rhythm: Normal rate. Rhythm irregular.     Pulses: Normal pulses.     Heart sounds: Normal heart sounds.  Pulmonary:     Effort: Pulmonary effort is normal.     Breath sounds: Normal breath sounds.  Abdominal:     General: Abdomen is flat. Bowel sounds are normal.     Palpations: Abdomen is soft.  Musculoskeletal:        General: Normal range of motion.     Cervical back: Normal range of motion and neck supple.  Skin:    General: Skin is warm.     Capillary Refill: Capillary refill takes less than 2 seconds.  Neurological:     General: No focal deficit present.     Mental Status: She is alert. She is disoriented.     Comments: Pt will look at you and not answer questions  Psychiatric:        Mood and Affect: Mood normal.        Behavior: Behavior normal.    ED Results / Procedures /  Treatments   Labs (all labs ordered are listed, but only abnormal results are displayed) Labs Reviewed  RESP PANEL BY RT-PCR (FLU A&B, COVID) ARPGX2 - Abnormal; Notable for the following components:      Result Value   SARS Coronavirus 2 by RT PCR POSITIVE (*)    All other components within normal limits  CBC WITH DIFFERENTIAL/PLATELET - Abnormal; Notable for the following components:   WBC 12.2 (*)    Neutro Abs 8.9 (*)    Monocytes Absolute 1.5 (*)    All other components within normal limits  COMPREHENSIVE METABOLIC PANEL - Abnormal; Notable for the following components:   Glucose, Bld 124 (*)    Albumin 3.1 (*)    All other components  within normal limits  CBG MONITORING, ED - Abnormal; Notable for the following components:   Glucose-Capillary 116 (*)    All other components within normal limits  CULTURE, BLOOD (ROUTINE X 2)  CULTURE, BLOOD (ROUTINE X 2)  URINALYSIS, ROUTINE W REFLEX MICROSCOPIC  LACTIC ACID, PLASMA  LACTIC ACID, PLASMA  D-DIMER, QUANTITATIVE  PROCALCITONIN  LACTATE DEHYDROGENASE  FERRITIN  TRIGLYCERIDES  FIBRINOGEN  C-REACTIVE PROTEIN    EKG EKG Interpretation  Date/Time:  Monday April 02 2021 18:15:29 EST Ventricular Rate:  106 PR Interval:    QRS Duration: 90 QT Interval:  337 QTC Calculation: 448 R Axis:   9 Text Interpretation: Atrial fibrillation Low voltage, precordial leads Repol abnrm suggests ischemia, anterolateral No significant change since last tracing Confirmed by Isla Pence [phone]) on 04/02/2021 6:34:46 PM  Radiology DG Chest 2 View  Result Date: 04/02/2021 CLINICAL DATA:  Altered mental status. EXAM: CHEST - 2 VIEW COMPARISON:  Chest radiograph dated 02/25/2021. FINDINGS: Mild eventration of the right hemidiaphragm. Minimal left lung base atelectasis/scarring. No focal consolidation, pleural effusion or pneumothorax. Stable mild cardiomegaly. Median sternotomy wires and CABG vascular clips. Atherosclerotic calcification of the aorta. Degenerative changes of the spine. No acute osseous pathology. IMPRESSION: No active cardiopulmonary disease. Electronically Signed   By: Anner Crete M.D.   On: 04/02/2021 18:51   CT Head Wo Contrast  Result Date: 04/02/2021 CLINICAL DATA:  Altered mental status. EXAM: CT HEAD WITHOUT CONTRAST TECHNIQUE: Contiguous axial images were obtained from the base of the skull through the vertex without intravenous contrast. COMPARISON:  February 25, 2021 FINDINGS: Brain: There is mild cerebral atrophy with widening of the extra-axial spaces and ventricular dilatation. There are areas of decreased attenuation within the white matter tracts of  the supratentorial brain, consistent with microvascular disease changes. A small right-sided para thalamic lacunar infarct is noted. Vascular: No hyperdense vessel or unexpected calcification. Skull: Normal. Negative for fracture or focal lesion. Sinuses/Orbits: There is mild left ethmoid sinus mucosal thickening. Other: None. IMPRESSION: 1. Generalized cerebral atrophy. 2. No acute intracranial abnormality. Electronically Signed   By: Virgina Norfolk M.D.   On: 04/02/2021 19:12   MR BRAIN WO CONTRAST  Result Date: 04/02/2021 CLINICAL DATA:  Dizziness and confusion with generalized weakness. EXAM: MRI HEAD WITHOUT CONTRAST TECHNIQUE: Multiplanar, multiecho pulse sequences of the brain and surrounding structures were obtained without intravenous contrast. COMPARISON:  02/21/2017 FINDINGS: Brain: No acute infarct, mass effect or extra-axial collection. No acute or chronic hemorrhage. Confluent hyperintense T2-weighted white matter signal. Generalized volume loss without a clear lobar predilection. The midline structures are normal. Vascular: Major flow voids are preserved. Skull and upper cervical spine: Normal calvarium and skull base. Visualized upper cervical spine and soft tissues are normal. Sinuses/Orbits:No paranasal sinus  fluid levels or advanced mucosal thickening. No mastoid or middle ear effusion. Normal orbits. IMPRESSION: 1. No acute intracranial abnormality. 2. Confluent hyperintense T2-weighted white matter signal, most consistent with chronic ischemic microangiopathy. 3. Generalized volume loss without a clear lobar predilection. ( Cerebral Atrophy (ICD10-G31.9).) Electronically Signed   By: Ulyses Jarred M.D.   On: 04/02/2021 22:02    Procedures Procedures   Medications Ordered in ED Medications  0.9 %  sodium chloride infusion (has no administration in time range)  sodium chloride 0.9 % bolus 500 mL (500 mLs Intravenous New Bag/Given 04/02/21 1822)  diltiazem (CARDIZEM) injection 20 mg  (20 mg Intravenous Given 04/02/21 2018)    ED Course  I have reviewed the triage vital signs and the nursing notes.  Pertinent labs & imaging results that were available during my care of the patient were reviewed by me and considered in my medical decision making (see chart for details).    MDM Rules/Calculators/A&P                           Pt is + for Covid.  She has been vaccinated.  She is not hypoxic, but she is very confused.  Pt is dehydrated and given gentle hydration due to the Covid.  She had a MRI which did not show an acute stroke.  HR did bump up to the 140s, so she was given 20 mg cardizem.  She has not required a drip.  Pt d/w Dr. Velia Meyer (triad) for admission.   CATALEYA CRISTINA was evaluated in Emergency Department on 04/02/2021 for the symptoms described in the history of present illness. She was evaluated in the context of the global COVID-19 pandemic, which necessitated consideration that the patient might be at risk for infection with the SARS-CoV-2 virus that causes COVID-19. Institutional protocols and algorithms that pertain to the evaluation of patients at risk for COVID-19 are in a state of rapid change based on information released by regulatory bodies including the CDC and federal and state organizations. These policies and algorithms were followed during the patient's care in the ED.  Final Clinical Impression(s) / ED Diagnoses Final diagnoses:  Dehydration  EFEOF-12  Acute metabolic encephalopathy  Atrial fibrillation with RVR ([place])  On apixaban therapy    Rx / DC Orders ED Discharge Orders     None        Isla Pence, MD 04/02/21 2239

## 2021-04-02 NOTE — ED Triage Notes (Signed)
Patient BIB [facility] from [facility]. Per facility staff pt was altered yesterday, worse today, facility did endorse last knowing her normal in November, pt dementia hx, unknown baseline. VSS. HX of afib.

## 2021-04-02 NOTE — Discharge Instructions (Addendum)
Follow with Primary MD System, Provider Not In in 7 days   Get CBC, CMP, 2 view Chest X ray -  checked next visit within 1 week by Primary MD or SNF MD   Activity: As tolerated with Full fall precautions use walker/cane & assistance as needed  Disposition  SNF  Diet: Heart Healthy with feeding assistance and aspiration precautions.  Special Instructions: If you have smoked or chewed Tobacco  in the last 2 yrs please stop smoking, stop any regular Alcohol  and or any Recreational drug use.  On your next visit with your primary care physician please Get Medicines reviewed and adjusted.  Please request your Prim.MD to go over all Hospital Tests and Procedure/Radiological results at the follow up, please get all Hospital records sent to your Prim MD by signing hospital release before you go home.  If you experience worsening of your admission symptoms, develop shortness of breath, life threatening emergency, suicidal or homicidal thoughts you must seek medical attention immediately by calling 911 or calling your MD immediately  if symptoms less severe.  You Must read complete instructions/literature along with all the possible adverse reactions/side effects for all the Medicines you take and that have been prescribed to you. Take any new Medicines after you have completely understood and accpet all the possible adverse reactions/side effects.

## 2021-04-02 NOTE — ED Notes (Signed)
Patient is resting comfortably with daughter at bedside.  Updated on POC. No needs at this time and pt in NAD

## 2021-04-02 NOTE — H&P (Signed)
History and Physical    PLEASE NOTE THAT [employer]AGON DICT[employer] SOFTWARE WAS USED IN THE CONSTRUCTION OF THIS NOTE.   Marissa Bowen IWL:[id number] DOB: [birth year] DOA: 04/02/2021  PCP: System, Provider Not In (will further assess) Patient coming from: ALF  I have personally briefly reviewed patient's old medical records in [place]  Chief Complaint: Altered mental status  HPI: Marissa Bowen is a 87 y.o. female with medical history significant for dementia, paroxysmal atrial fibrillation chronically anticoagulated on Eliquis, essential hypertension, chronic diastolic heart failure, who is admitted to [facility] on 04/02/2021 with acute metabolic encephalopathy after presenting from ALF to [facility] ED for evaluation of altered mental status.   In the setting of patient's altered mental status and associated limited ability to contribute to the history, the following history is provided by the patient's daughter, in addition to my discussions with the EDP and via chart review.  Daughter reports that the patient has exhibited evidence of 10 days of confusion relative to her baseline mental status, which she has a documented history of dementia, starting on the day after Thanksgiving, 03/24/2021.  This is been associated with increased somnolence, as well as diminished interaction with others, relative to her baseline which she is reportedly talkative and intervative with others.  At baseline, she is able to transfer without assistance and able to ambulate independently.  However, daughter reports that the patient has exhibited much less interest in ambulation over the last 10 days, resulting in a more sedentary scenario over that timeframe.  Daughter also conveys that the patient has exhibited associated evidence of decline in oral intake of both food and water over the last 10 days concomitant with onset of her altered mental status and relative somnolence.   Marissa Bowen reports that the patient  has not complained of any recent shortness of breath, the daughter has not noted any new onset cough over the last 10 days.  No preceding trauma.  Daughter has not noted any vomiting, diarrhea, and the patient is without complaint throughout the last 10 days including this evening.  She denies any recent chest pain.  It is reportedly a lifelong non-smoker, without any baseline supplemental oxygen requirements.  Unclear if the patient is had any recent known COVID-19 exposures.  No recent traveling.  In the context of persistence of her confusion, diminished interaction, daughter took the patient to the urologist on 03/28/2020, with ensuing urinalysis reportedly inconsistent with UTI.  As the patient's mental status is not improved in the interval, the patient is brought to [facility] emergency department this evening for further evaluation and management of her presenting 10 days of altered mental status.  Medical history notable for paroxysmal atrial fibrillation for which she is chronically anticoagulated on Eliquis.  Not on any scheduled AV nodal blocking agents, but rather on amiodarone.  She also has a history of chronic diastolic heart failure, with most recent echocardiogram in October 2018 showing normal left ventricular cavity size, mild to moderate LVH, LVEF 60 to 65%, no evidence of focal wall motion normalities, while demonstrating evidence of grade 1 diastolic dysfunction.  Not on any scheduled diuretic medications as an outpatient     ED Course:  Vital signs in the ED were notable for the following: Afebrile; initial heart rate 109/117, with ensuing improvement into the range of 80-95 following 500 cc normal saline bolus with ensuing continuous NS at 150 cc/h; blood pressure 136/83 -141/72; respiratory rate 20-23, oxygen saturation 94 to 96% on  room air.  Labs were notable for the following: CMP notable for the following: Bicarbonate 29, BUN 19, creatinine 0.74 relative to most recent prior  creatinine data point of 0.87 on 02/25/2021, with presenting BUN/creatinine ratio noted to be 25.7.  Calcium, corrected for mild hypoalbuminemia, 10.0, albumin 3.1, otherwise, liver enzymes are within normal limits.  Lactic acid 1.2.  CBC notable for white cell count 12,200, hemoglobin 13.1.  Urinalysis shows 6-10 white blood cells, rare bacteria, nitrate negative, leukocyte esterase negative, specific gravity 1.025, and showed 40 ketones.  Influenza A/B PCR found to be negative, will COVID-19 PCR positive.  Per chart review, no recent prior COVID-19 testing leading up to this evening's positive value.  Blood cultures collected.  Imaging and additional notable ED work-up: EKG shows atrial fibrillation with a single PVC and ventricular rate 106, nonspecific T wave inversion in leads I and aVL, which appear unchanged relative to most recent prior EKG from 02/25/2021, and no evidence of ST changes, including no evidence of ST elevation.  2 view chest x-ray showed no evidence of acute cardiopulmonary process, including evidence of infiltrate, edema, effusion, or pneumothorax.  Noncontrast CT that showed no evidence of acute intracranial process, including no evidence of intracranial hemorrhage.  MRI brain demonstrated no evidence of acute intracranial process, including no evidence of acute ischemic infarct.  While in the ED, the following were administered: Remdesivir x1 dose.  Subsequently, patient was admitted to med telemetry unit for further evaluation and management of presenting acute metabolic encephalopathy.     Review of Systems: As per HPI otherwise 10 point review of systems negative.   Past Medical History:  Diagnosis Date   Anemia    Anisocoria 02/27/2017   Sees Dr Rutherford Guys   Anxiety    BACK PAIN, LUMBAR 09/27/2009   CAD (coronary artery disease)    a. s/p remote CABG 1997, 3V.;  b. LexiScan Myoview (8/15): No ischemia, EF 72%, normal study;  c. Echo 12/13 - EF 60-65%, no significant  valvular abnormalities, normal RV size and systolic function.     CVA (cerebral infarction)    a. Thalamic infarct 09/2009. //  b. hx of TIA in 2008   Gastric ulcer    GERD 11/25/2006   GI bleed    a. 09/2009: secondary to antral ulcer.   Hiatal hernia    History of diverticulitis Dx 02/2012   History of MI (myocardial infarction)    HYPERLIPIDEMIA    HYPERTENSION    IBS 12/07/2008   MIGRAINE WITH AURA 08/13/2007   Mild carotid artery disease (HCC)    a. 0-39% bilat 05/2011 (f/u recommended 05/2013). //  b. Carotid US 3/17 - Bilat ICA 1-39% >> FU prn   Muscle weakness (generalized) 09/27/2009   OSTEOARTHRITIS 08/13/2007   PAF (paroxysmal atrial fibrillation) ([place]) 11/25/2006   a. Multaq >> changes to Amiodarone 2/2 $$  //  Eliquis (CHADS2-VASc = 6)   Urinary, incontinence, stress female    wears Pessary    Past Surgical History:  Procedure Laterality Date   ABDOMINAL HYSTERECTOMY  1973   CATARACT EXTRACTION W/ INTRAOCULAR LENS  IMPLANT, BILATERAL Bilateral 1999   CHOLECYSTECTOMY N/A 01/03/2015   Procedure: LAPAROSCOPIC CHOLECYSTECTOMY WITH INTRAOPERATIVE CHOLANGIOGRAM;  Surgeon: Donnie Mesa, MD;  Location: [place];  Service: General;  Laterality: N/A;   CORONARY ANGIOPLASTY  1997   Dr Lia Foyer   CORONARY ARTERY BYPASS GRAFT  1997   Dr Melvenia Needles   LAPAROSCOPIC CHOLECYSTECTOMY  01/03/2015   TONSILLECTOMY  Social History:  reports that she has never smoked. She has never used smokeless tobacco. She reports that she does not drink alcohol and does not use drugs.   Allergies  Allergen Reactions   Asa [Aspirin] Other (See Comments)    Bleeding ulcer Per MAR   Crestor [Rosuvastatin Calcium] Other (See Comments)    Myalgia Per MAR   Hydrocodone Other (See Comments)    Migraines Per MAR   Ibuprofen Other (See Comments)    Ulcers Per MAR   Oxycodone Hcl Other (See Comments)     Migraines Per MAR   Penicillins Swelling and Rash    Per MAR Has patient had a PCN reaction causing  immediate rash, facial/tongue/throat swelling, SOB or lightheadedness with hypotension: Yes Has patient had a PCN reaction causing severe rash involving mucus membranes or skin necrosis: No Has patient had a PCN reaction that required hospitalization No Has patient had a PCN reaction occurring within the last 10 years: No If all of the above answers are "NO", then may proceed with Cephalosporin use.    Prednisone Other (See Comments)    Ulcers Per MAR   Statins Other (See Comments)    Aches and pains Per MAR   Morphine And Related Other (See Comments)     Had hallucinations with morphine sulfate in the past   Per [facility]   Pristiq [Desvenlafaxine] Other (See Comments)    Drowsiness and hangover sensation Per MAR     Family History  Problem Relation Age of Onset   Heart attack Mother    Heart failure Father    Throat cancer Sister    Heart disease Sister    Coronary artery disease Sister    Diabetes Sister    Heart attack Sister    Colon cancer Neg Hx     Family history reviewed and not pertinent    Prior to Admission medications   Medication Sig Start Date End Date Taking? Authorizing Provider  acetaminophen (TYLENOL) 325 MG tablet Take 650 mg by mouth every 6 (six) hours as needed for moderate pain.     [provider]  amiodarone (PACERONE) 200 MG tablet TAKE ONE-HALF TABLET DAILY 01/08/17   Gildardo Cranker, DO  bisacodyl (DULCOLAX) 10 MG suppository Place 10 mg rectally as needed for moderate constipation.    [provider]  DULoxetine (CYMBALTA) 60 MG capsule Take 1 capsule (60 mg total) by mouth daily. For mood and chronic pain 01/29/17   Gildardo Cranker, DO  ELIQUIS 5 MG TABS tablet Take 1 tablet (5 mg total) by mouth 2 (two) times daily. 07/26/16   Josue Hector, MD  ezetimibe (ZETIA) 10 MG tablet Take 1 tablet (10 mg total) by mouth daily. 02/26/17   Dessa Phi, DO  losartan (COZAAR) 50 MG tablet TAKE ONE TABLET TWICE DAILY 09/11/16   Josue Hector, MD  magnesium hydroxide (MILK OF MAGNESIA) 400 MG/5ML suspension Take 30 mLs by mouth daily as needed for mild constipation.    [provider]  meclizine (ANTIVERT) 12.5 MG tablet Take 12.5 mg by mouth every 8 (eight) hours as needed for dizziness.     [provider]  Multiple Vitamin (MULTIVITAMIN WITH MINERALS) TABS tablet Take 1 tablet by mouth daily.    [provider]  Multiple Vitamins-Minerals (PRESERVISION AREDS) TABS Take 1 tablet by mouth daily.     [provider]  niacin 500 MG CR capsule Take 500 mg by mouth 2 (two) times daily.  [provider]  pantoprazole (PROTONIX) 40 MG tablet TAKE ONE TABLET TWICE DAILY 09/17/16   Gildardo Cranker, DO  QUEtiapine (SEROQUEL) 25 MG tablet Take 1 tablet (25 mg total) by mouth at bedtime. 02/25/17   Dessa Phi, DO  sodium chloride (OCEAN) 0.65 % SOLN nasal spray Place 1 spray into both nostrils at bedtime.     [provider]  Sodium Phosphates (RA SALINE ENEMA) 19-7 GM/118ML ENEM Place 118 mLs rectally daily as needed (FOR CONSTIPATION).    [provider]  Tetrahydrozoline HCl (VISINE OP) Place 2 drops into both eyes at bedtime.     [provider]  sertraline (ZOLOFT) 25 MG tablet Take 1 tablet (25 mg total) by mouth daily. 02/14/11 10/19/11  Marletta Lor, MD     Objective    Physical Exam: Vitals:   04/02/21 2015 04/02/21 2030 04/02/21 2045 04/02/21 2100  BP: (!) 151/104 (!) 141/66 139/65 (!) 159/68  Pulse: 89 64 (!) 45 85  Resp: (!) 22 (!) 21 (!) 22 (!) 21  Temp:      TempSrc:      SpO2: 96% (!) 89% 92% 90%    General: appears to be stated age; somnolent, but opens eyes to verbal stimuli; confused  Skin: warm, dry, no rash Head:  AT/[place] Mouth:  Oral mucosa membranes appear dry, normal dentition Neck: supple; trachea midline Heart:  RRR; did not appreciate any M/R/G Lungs: CTAB, did not appreciate any wheezes, rales, or rhonchi Abdomen: + BS;  soft, ND, NT Vascular: 2+ pedal pulses b/l; 2+ radial pulses b/l Extremities: no peripheral edema, no muscle wasting Neuro: In the setting of the patient's current mental status and associated inability to follow instructions, unable to perform full neurologic exam at this time.  As such, assessment of strength, sensation, and cranial nerves is limited at this time. Patient noted to spontaneously move all 4 extremities. No tremors.    Labs on Admission: I have personally reviewed following labs and imaging studies  CBC: Recent Labs  Lab 04/02/21 1818  WBC 12.2*  NEUTROABS 8.9*  HGB 13.1  HCT 41.7  MCV 95.4  PLT 828   Basic Metabolic Panel: Recent Labs  Lab 04/02/21 1818  NA 137  K 4.3  CL 98  CO2 29  GLUCOSE 124*  BUN 19  CREATININE 0.74  CALCIUM 9.2   GFR: CrCl cannot be calculated (Unknown ideal weight.). Liver Function Tests: Recent Labs  Lab 04/02/21 1818  AST 22  ALT 20  ALKPHOS 68  BILITOT 1.1  PROT 6.9  ALBUMIN 3.1*   No results for input(s): LIPASE, AMYLASE in the last 168 hours. No results for input(s): AMMONIA in the last 168 hours. Coagulation Profile: No results for input(s): INR, PROTIME in the last 168 hours. Cardiac Enzymes: No results for input(s): CKTOTAL, CKMB, CKMBINDEX, TROPONINI in the last 168 hours. BNP (last 3 results) No results for input(s): PROBNP in the last 8760 hours. HbA1C: No results for input(s): HGBA1C in the last 72 hours. CBG: Recent Labs  Lab 04/02/21 1825  GLUCAP 116*   Lipid Profile: No results for input(s): CHOL, HDL, LDLCALC, TRIG, CHOLHDL, LDLDIRECT in the last 72 hours. Thyroid Function Tests: No results for input(s): TSH, T4TOTAL, FREET4, T3FREE, THYROIDAB in the last 72 hours. Anemia Panel: No results for input(s): VITAMINB12, FOLATE, FERRITIN, TIBC, IRON, RETICCTPCT in the last 72 hours. Urine analysis:    Component Value Date/Time   COLORURINE AMBER (A) 10/04/2016 1825   APPEARANCEUR HAZY (A)  10/04/2016 1825   LABSPEC 1.014 10/04/2016 1825   PHURINE 5.0 10/04/2016 1825   GLUCOSEU NEGATIVE 10/04/2016 1825   HGBUR NEGATIVE 10/04/2016 1825   BILIRUBINUR NEGATIVE 10/04/2016 1825   BILIRUBINUR 2+ 11/09/2014 1429   KETONESUR 5 (A) 10/04/2016 1825   PROTEINUR 100 (A) 10/04/2016 1825   UROBILINOGEN negative 11/09/2014 1429   UROBILINOGEN 0.2 01/12/2013 1618   NITRITE NEGATIVE 10/04/2016 1825   LEUKOCYTESUR TRACE (A) 10/04/2016 1825    Radiological Exams on Admission: DG Chest 2 View  Result Date: 04/02/2021 CLINICAL DATA:  Altered mental status. EXAM: CHEST - 2 VIEW COMPARISON:  Chest radiograph dated 02/25/2021. FINDINGS: Mild eventration of the right hemidiaphragm. Minimal left lung base atelectasis/scarring. No focal consolidation, pleural effusion or pneumothorax. Stable mild cardiomegaly. Median sternotomy wires and CABG vascular clips. Atherosclerotic calcification of the aorta. Degenerative changes of the spine. No acute osseous pathology. IMPRESSION: No active cardiopulmonary disease. Electronically Signed   By: Anner Crete M.D.   On: 04/02/2021 18:51   CT Head Wo Contrast  Result Date: 04/02/2021 CLINICAL DATA:  Altered mental status. EXAM: CT HEAD WITHOUT CONTRAST TECHNIQUE: Contiguous axial images were obtained from the base of the skull through the vertex without intravenous contrast. COMPARISON:  February 25, 2021 FINDINGS: Brain: There is mild cerebral atrophy with widening of the extra-axial spaces and ventricular dilatation. There are areas of decreased attenuation within the white matter tracts of the supratentorial brain, consistent with microvascular disease changes. A small right-sided para thalamic lacunar infarct is noted. Vascular: No hyperdense vessel or unexpected calcification. Skull: Normal. Negative for fracture or focal lesion. Sinuses/Orbits: There is mild left ethmoid sinus mucosal thickening. Other: None. IMPRESSION: 1. Generalized cerebral atrophy. 2. No  acute intracranial abnormality. Electronically Signed   By: Virgina Norfolk M.D.   On: 04/02/2021 19:12   MR BRAIN WO CONTRAST  Result Date: 04/02/2021 CLINICAL DATA:  Dizziness and confusion with generalized weakness. EXAM: MRI HEAD WITHOUT CONTRAST TECHNIQUE: Multiplanar, multiecho pulse sequences of the brain and surrounding structures were obtained without intravenous contrast. COMPARISON:  02/21/2017 FINDINGS: Brain: No acute infarct, mass effect or extra-axial collection. No acute or chronic hemorrhage. Confluent hyperintense T2-weighted white matter signal. Generalized volume loss without a clear lobar predilection. The midline structures are normal. Vascular: Major flow voids are preserved. Skull and upper cervical spine: Normal calvarium and skull base. Visualized upper cervical spine and soft tissues are normal. Sinuses/Orbits:No paranasal sinus fluid levels or advanced mucosal thickening. No mastoid or middle ear effusion. Normal orbits. IMPRESSION: 1. No acute intracranial abnormality. 2. Confluent hyperintense T2-weighted white matter signal, most consistent with chronic ischemic microangiopathy. 3. Generalized volume loss without a clear lobar predilection. ( Cerebral Atrophy (ICD10-G31.9).) Electronically Signed   By: Ulyses Jarred M.D.   On: 04/02/2021 22:02     EKG: Independently reviewed, with result as described above.    Assessment/Plan   Principal Problem:   Acute metabolic encephalopathy Active Problems:   Hyperlipidemia LDL goal <100   GERD   Atrial fibrillation (HCC)   Severe sepsis ([place])   COVID-19 virus infection   Dehydration   Acute prerenal azotemia      #) Acute metabolic encephalopathy: Relative to her baseline dementia, patient presents with 10 days altered mental status, confusion, somnolence, diminished interaction, diminished activity reportedly starting on 03/24/2021, and suspected to be on the basis of physiologic stressors stemming from presenting  severe sepsis due to COVID-19 infection, per positive COVID-19 PCR performed in the ED this evening. No obvious additional  contributory underlying infectious process at this time, including negative influenza PCR performed today,  presenting UA that is not suggestive of UTI, and chest x-ray without evidence of pneumonia, including no infiltrate.  There may be an additional metabolic contribution from perceived dehydration, as further detailed below.  Reported recent preceding modifications to outpatient medication regimen, and while she is on multiple central acting medications as an outpatient, this is felt to be a last likely significant contributor to her 10 days of confusion.  Regardless, will allow for a washout period of these central acting medications to 11 8 any contribution they may offer as a confounding variable.  No overt acute focal neurologic deficits to suggest a contribution from an underlying acute CVA.  Furthermore, CT head and MRI brain performed in the ED this evening demonstrated no evidence of acute process, intracranial hemorrhage, or acute ischemic stroke.  Seizures are also felt to be less likely. Will check VBG to evaluate for any contribution from hypercapnic encephalopathy. Will keep patient NPO until mental status improves sufficiently that patient is able to participate in and pass nursing bedside swallow screen.      Plan: fall precautions. Repeat CMP/CBC in the AM. Check magnesium level. check VBG, TSH, MMA. NPO pending nursing bedside swallow evaluation prior to the initiation of a diet/oral medications, as described above.  Further evaluation management of presenting severe sepsis due to COVID-19 infection, as further detailed below.  Gentle IV fluids, as further detailed below.  Not on procalcitonin.  Hold home Cymbalta, Protonix for now.     #) Incidental COVID-19 infection: COVID-19 PCR performed in the ED today found to be positive, with no recent preceding COVID-19  test performed as an outpatient.  Associated with any respiratory symptoms, including no cough or shortness of breath.  Afebrile.  However, patient's COVID-19 infection is suspected to pose physiologic stress to the patient leading to her presenting hospitalization for acute metabolic encephalopathy, noting ongoing confusion over the course of the last 10 days.  Given that COVID-19 is not the primary reason for hospitalization, criteria met for incidental COVID-19 infection.  Given this incidental nature in the absence of any acute respiratory symptoms, criteria are not met for remdesivir.  Additionally, as the patient is maintaining oxygen saturations in the mid 90s on room air, no indication for initiation of systemic steroids at this time.  Given that suspected COVID related symptoms have been occurring for 10 days, presentation does not meet criteria for initiation of Paxlovid.,  Overall, patient's incidentally noted COVID-19 infection appears to warrant/meet criteria for management but is more supportive in nature at this time.  Of note, chest x-ray shows no evidence of acute cardiopulmonary process.  No history of chronic underlying pulmonary pathology.  Of note, inflammatory markers ordered by EDP this evening, with results currently pending.  Plan: Monitor continuous pulse oximetry.  Prn albuterol inhaler.  Follow-up results of inflammatory markers ordered by EDP this evening.  Repeat CRP in the morning.  CBC in the morning.  Add on serum magnesium level and check serum Phos.  Discontinue subsequent remdesivir, as above.  Refraining fromPaxlovid, as above.  Add on procalcitonin.        #) Severe sepsis due to COVID-19 infection: Diagnosed with PCR result this evening, as above, with SIRS criteria met via presenting leukocytosis, and tachycardia.  Patient sepsis meets criteria to be considered severe in nature on the basis of concomitant presenting acute metabolic encephalopathy.  Of note, initial  lactic acid nonelevated at  1.2. in the absence of lactic acid level that is greater than or equal to 4.0, and in the absence of any associated hypotension refractory to IVF's, there are no indications for administration of a 30 mL/kg IVF bolus at this time.  No e/o additional underlying infectious process at this time, including urinalysis that is inconsistent with UTI, influenza AMB PCR negative, and chest x-ray shows no evidence of acute process, clear evidence of infiltrate to suggest pneumonia.  Blood cultures collected in the ED.  Patient sepsis appears to be on the basis of a viral infection, without any evidence of concomitant or secondary bacterial infectious source, there appears to be no indication for antibiotics at this time, and therefore will refrain from initiation of such.   Plan: CBC w/ diff in AM.  Follow-up results of blood cultures collected this evening.  Further evaluation and management COVID-19 infection, as above.         #) Dehydration: Clinical suspicion for such, including the appearance of dry oral mucous membranes as well as laboratory findings notable for acute prerenal azotemia and UA demonstrating elevated specific gravity.  Appears to be in the setting of   report of recent decline in oral intake concomitant with development of somnolence/confusion over the last 2 days.  No e/o associated hypotension.  Status post 500 cc normal saline bolus in the ED, followed by continuous NS at 150 cc/h.  In the absence of any hypotension, but while acknowledging history of chronic diastolic heart failure, will reduce rate of continuous NS to 75 cc/h.    Plan: Monitor strict I's and O's.  Daily weights.  Repeat BMP in the morning. IVF's in form of sepsis as per hour, representing reduction from previous continuous rate of 150 cc/h.  Further evaluation management of presenting acute metabolic encephalopathy, as above.Marland Kitchen         #) Paroxysmal atrial fibrillation: Documented  history of such. In the setting of a CHA2DS2-VASc score of 7, there is an indication for the patient to be on chronic anticoagulation for thromboembolic prophylaxis. Consistent with this, the patient is chronically anticoagulated on Eliquis. Home AV nodal blocking regimen: None.  On amiodarone as an outpatient.  Most recent echocardiogram was performed in October 2018, with details of the study conveyed above. Presenting EKG demonstrates rate controlled atrial fibrillation, although the patient is at risk for development of atrial fibrillation with RVR due to physiologic stress stemming from her presenting severe sepsis due to incidentally noted COVID-19 infection, as above.    Plan: monitor strict I's & O's and daily weights. Repeat BMP and CBC in the morning. Check serum magnesium level.  Continue home Eliquis and amiodarone.  As needed IV Lopressor for sustained heart rate greater than 130 bpm.  Monitor on telemetry.          #) GERD: documented h/o such; on Protonix as outpatient.  However, in the setting of presenting acute encephalopathy, and with potential for encephalopathic side effects of PPIs, will hold home Protonix for now pending further evaluation management of presenting acute metabolic encephalopathy, as above  Plan: Hold home PPI for now.         #) Essential Hypertension: documented h/o such, with outpatient antihypertensive regimen including losartan.  SBP's in the ED today: Normotensive.  In the setting of presenting severe sepsis, will hold home losartan for now.    Plan: Close monitoring of subsequent BP via routine VS. hold home losartan for now, as above.       #)  Hyperlipidemia: documented h/o such. On Zetia as outpatient.    Plan: continue home safety.           #) Chronic diastolic heart failure: documented history of such, with most recent echocardiogram performed in October 2018 notable for LVEF 60 to 65% as well as grade 1 diastolic  dysfunction, with additional details as conveyed above. No clinical or radiographic evidence to suggest acutely decompensated heart failure at this time.  Rather, the patient appears mildly dehydrated in the setting of recent decline in oral intake. home diuretic regimen reportedly consists of the following: Not on any scheduled diuretic medications as an outpatient.    Plan: monitor strict I's & O's and daily weights. Repeat BMP in AM. Check serum mag level.      DVT prophylaxis: Continue outpatient Eliquis Code Status: Full code Family Communication: daughter, as above Disposition Plan: Per Rounding Team Consults called: none;  Admission status: Inpatient; med telemetry  Warrants inpatient status on basis of the need for further evaluation management of presenting acute metabolic encephalopathy, including expanding metabolic laboratory evaluation while closely monitoring ensuing vital signs as it relates to severe sepsis on the basis of COVID-19 infection.    PLEASE NOTE THAT DRAGON DICTATION SOFTWARE WAS USED IN THE CONSTRUCTION OF THIS NOTE.   [place] DO [facility]  From [place]   04/02/2021, 10:44 PM

## 2021-04-03 ENCOUNTER — Ambulatory Visit: Payer: Medicare Other | Admitting: Cardiovascular Disease

## 2021-04-03 ENCOUNTER — Encounter (HOSPITAL_COMMUNITY): Payer: Self-pay | Admitting: Internal Medicine

## 2021-04-03 ENCOUNTER — Inpatient Hospital Stay (HOSPITAL_COMMUNITY): Payer: Medicare Other

## 2021-04-03 DIAGNOSIS — G9341 Metabolic encephalopathy: Secondary | ICD-10-CM | POA: Diagnosis not present

## 2021-04-03 DIAGNOSIS — U071 COVID-19: Secondary | ICD-10-CM | POA: Diagnosis present

## 2021-04-03 DIAGNOSIS — N19 Unspecified kidney failure: Secondary | ICD-10-CM | POA: Diagnosis present

## 2021-04-03 DIAGNOSIS — E860 Dehydration: Secondary | ICD-10-CM | POA: Diagnosis present

## 2021-04-03 DIAGNOSIS — A419 Sepsis, unspecified organism: Secondary | ICD-10-CM | POA: Diagnosis present

## 2021-04-03 LAB — COMPREHENSIVE METABOLIC PANEL
ALT: 17 U/L (ref 0–44)
AST: 17 U/L (ref 15–41)
Albumin: 2.8 g/dL — ABNORMAL LOW (ref 3.5–5.0)
Alkaline Phosphatase: 62 U/L (ref 38–126)
Anion gap: 10 (ref 5–15)
BUN: 16 mg/dL (ref 8–23)
CO2: 27 mmol/L (ref 22–32)
Calcium: 8.7 mg/dL — ABNORMAL LOW (ref 8.9–10.3)
Chloride: 100 mmol/L (ref 98–111)
Creatinine, Ser: 0.6 mg/dL (ref 0.44–1.00)
GFR, Estimated: >60 mL/min (ref >60)
Glucose, Bld: 106 mg/dL — ABNORMAL HIGH (ref 70–99)
Potassium: 3.6 mmol/L (ref 3.5–5.1)
Sodium: 137 mmol/L (ref 135–145)
Total Bilirubin: 0.9 mg/dL (ref 0.3–1.2)
Total Protein: 6.4 g/dL — ABNORMAL LOW (ref 6.5–8.1)

## 2021-04-03 LAB — CBC WITH DIFFERENTIAL/PLATELET
Abs Immature Granulocytes: 0.03 10*3/uL (ref 0.00–0.07)
Basophils Absolute: 0.1 10*3/uL (ref 0.0–0.1)
Basophils Relative: 1 %
Eosinophils Absolute: 0.2 10*3/uL (ref 0.0–0.5)
Eosinophils Relative: 2 %
HCT: 37.9 % (ref 36.0–46.0)
Hemoglobin: 11.7 g/dL — ABNORMAL LOW (ref 12.0–15.0)
Immature Granulocytes: 0 %
Lymphocytes Relative: 14 %
Lymphs Abs: 1.4 10*3/uL (ref 0.7–4.0)
MCH: 30.1 pg (ref 26.0–34.0)
MCHC: 30.9 g/dL (ref 30.0–36.0)
MCV: 97.4 fL (ref 80.0–100.0)
Monocytes Absolute: 1.4 10*3/uL — ABNORMAL HIGH (ref 0.1–1.0)
Monocytes Relative: 13 %
Neutro Abs: 7.3 10*3/uL (ref 1.7–7.7)
Neutrophils Relative %: 70 %
Platelets: 246 10*3/uL (ref 150–400)
RBC: 3.89 MIL/uL (ref 3.87–5.11)
RDW: 14.1 % (ref 11.5–15.5)
WBC: 10.4 10*3/uL (ref 4.0–10.5)
nRBC: 0 % (ref 0.0–0.2)

## 2021-04-03 LAB — I-STAT VENOUS BLOOD GAS, ED
Acid-Base Excess: 7 mmol/L — ABNORMAL HIGH (ref 0.0–2.0)
Bicarbonate: 30.7 mmol/L — ABNORMAL HIGH (ref 20.0–28.0)
Calcium, Ion: 1.04 mmol/L — ABNORMAL LOW (ref 1.15–1.40)
HCT: 37 % (ref 36.0–46.0)
Hemoglobin: 12.6 g/dL (ref 12.0–15.0)
O2 Saturation: 99 %
Potassium: 3.6 mmol/L (ref 3.5–5.1)
Sodium: 138 mmol/L (ref 135–145)
TCO2: 32 mmol/L (ref 22–32)
pCO2, Ven: 40.2 mmHg — ABNORMAL LOW (ref 44.0–60.0)
pH, Ven: 7.49 — ABNORMAL HIGH (ref 7.250–7.430)
pO2, Ven: 149 mmHg — ABNORMAL HIGH (ref 32.0–45.0)

## 2021-04-03 LAB — MRSA NEXT GEN BY PCR, NASAL: MRSA by PCR Next Gen: NOT DETECTED

## 2021-04-03 LAB — PROCALCITONIN: Procalcitonin: <0.1 ng/mL

## 2021-04-03 LAB — C-REACTIVE PROTEIN
CRP: 10.8 mg/dL — ABNORMAL HIGH (ref <1.0)
CRP: 11.2 mg/dL — ABNORMAL HIGH (ref <1.0)

## 2021-04-03 LAB — PHOSPHORUS: Phosphorus: 3.8 mg/dL (ref 2.5–4.6)

## 2021-04-03 LAB — BRAIN NATRIURETIC PEPTIDE: B Natriuretic Peptide: 135.1 pg/mL — ABNORMAL HIGH (ref 0.0–100.0)

## 2021-04-03 LAB — TRIGLYCERIDES: Triglycerides: 83 mg/dL (ref <150)

## 2021-04-03 LAB — MAGNESIUM: Magnesium: 2.1 mg/dL (ref 1.7–2.4)

## 2021-04-03 LAB — FERRITIN: Ferritin: 196 ng/mL (ref 11–307)

## 2021-04-03 LAB — AMMONIA: Ammonia: 12 umol/L (ref 9–35)

## 2021-04-03 LAB — TSH: TSH: 2.202 u[IU]/mL (ref 0.350–4.500)

## 2021-04-03 MED ORDER — DULOXETINE HCL 60 MG PO CPEP
60.0000 mg | ORAL_CAPSULE | Freq: Every day | ORAL | Status: DC
  Administered 2021-04-03 – 2021-04-04 (×2): 60 mg via ORAL
  Filled 2021-04-03 (×3): qty 1

## 2021-04-03 MED ORDER — METOPROLOL TARTRATE 5 MG/5ML IV SOLN: 5.0000 mg | INTRAVENOUS | Status: DC | PRN

## 2021-04-03 MED ORDER — APIXABAN 5 MG PO TABS
5.0000 mg | ORAL_TABLET | Freq: Two times a day (BID) | ORAL | Status: DC
  Administered 2021-04-03 – 2021-04-05 (×5): 5 mg via ORAL
  Filled 2021-04-03 (×5): qty 1

## 2021-04-03 MED ORDER — AMLODIPINE BESYLATE 5 MG PO TABS: 10.0000 mg | ORAL_TABLET | Freq: Every day | ORAL | Status: DC

## 2021-04-03 MED ORDER — METOPROLOL TARTRATE 50 MG PO TABS
50.0000 mg | ORAL_TABLET | Freq: Two times a day (BID) | ORAL | Status: DC
  Administered 2021-04-03 – 2021-04-05 (×5): 50 mg via ORAL
  Filled 2021-04-03: qty 2
  Filled 2021-04-03 (×4): qty 1

## 2021-04-03 MED ORDER — QUETIAPINE FUMARATE 50 MG PO TABS
25.0000 mg | ORAL_TABLET | Freq: Every day | ORAL | Status: DC
  Administered 2021-04-03 – 2021-04-04 (×2): 25 mg via ORAL
  Filled 2021-04-03 (×3): qty 1

## 2021-04-03 MED ORDER — LEVOFLOXACIN IN D5W 250 MG/50ML IV SOLN
250.0000 mg | INTRAVENOUS | Status: AC
  Administered 2021-04-03 – 2021-04-05 (×3): 250 mg via INTRAVENOUS
  Filled 2021-04-03 (×3): qty 50

## 2021-04-03 MED ORDER — DILTIAZEM HCL 25 MG/5ML IV SOLN
10.0000 mg | Freq: Four times a day (QID) | INTRAVENOUS | Status: DC | PRN
  Filled 2021-04-03: qty 5

## 2021-04-03 MED ORDER — EZETIMIBE 10 MG PO TABS
10.0000 mg | ORAL_TABLET | Freq: Every day | ORAL | Status: DC
  Administered 2021-04-03 – 2021-04-05 (×3): 10 mg via ORAL
  Filled 2021-04-03 (×4): qty 1

## 2021-04-03 MED ORDER — AMIODARONE HCL 100 MG PO TABS
100.0000 mg | ORAL_TABLET | Freq: Every day | ORAL | Status: DC
  Administered 2021-04-03 – 2021-04-05 (×3): 100 mg via ORAL
  Filled 2021-04-03 (×3): qty 1

## 2021-04-03 MED ORDER — DEXAMETHASONE SODIUM PHOSPHATE 10 MG/ML IJ SOLN
10.0000 mg | INTRAMUSCULAR | Status: DC
  Administered 2021-04-03 – 2021-04-05 (×3): 10 mg via INTRAVENOUS
  Filled 2021-04-03 (×3): qty 1

## 2021-04-03 MED ORDER — DIVALPROEX SODIUM 250 MG PO DR TAB
250.0000 mg | DELAYED_RELEASE_TABLET | Freq: Two times a day (BID) | ORAL | Status: DC
  Administered 2021-04-03 – 2021-04-05 (×4): 250 mg via ORAL
  Filled 2021-04-03 (×5): qty 1

## 2021-04-03 MED ORDER — PANTOPRAZOLE SODIUM 40 MG PO TBEC
40.0000 mg | DELAYED_RELEASE_TABLET | Freq: Every day | ORAL | Status: DC
  Administered 2021-04-03 – 2021-04-05 (×3): 40 mg via ORAL
  Filled 2021-04-03 (×3): qty 1

## 2021-04-03 NOTE — Progress Notes (Signed)
PROGRESS NOTE                                                                                                                                                                                                             Patient Demographics:    Marissa Bowen, is a 87 y.o. female, DOB - [birth year], KKX:[id number]  Outpatient Primary MD for the patient is System, Provider Not In    LOS - 1  Admit date - 04/02/2021    Chief Complaint  Patient presents with   Altered Mental Status       Brief Narrative (HPI from H&P)   Marissa Bowen is a 87 y.o. female with medical history significant for dementia, paroxysmal atrial fibrillation chronically anticoagulated on Eliquis, essential hypertension, chronic diastolic heart failure, who is admitted to [facility] on 04/02/2021 with acute metabolic encephalopathy after presenting from ALF to [facility] ED for evaluation of altered mental status, she was diagnosed with Covid PNA, UTI, AMS.   Subjective:    Marissa Bowen today in bed   Assessment  & Plan :     Acute Hypoxic Resp. Failure due to Acute Covid 19 Viral Pneumonitis with Acute Toxic Encephalopathy - she has moderate inflammation as suggested by her CRP, chest x-ray has an infiltrate, she is hypoxic, she has been placed on steroids and Remdesivir.  Overall due to her age her condition is tenuous.  Poor candidate for Actemra or aggressive measures.  Continue supportive care with supplemental oxygen and supportive measures.  No sepsis.  Encouraged the patient to sit up in chair in the daytime use I-S and flutter valve for pulmonary toiletry.  Will advance activity and titrate down oxygen as possible.   2.  Acute toxic encephalopathy.  Due to underlying dementia and #1 above, supportive care, minimize narcotics and benzodiazepines, Haldol as needed if needed.  MRI brain nonacute.  No focal deficits.  Continue nighttime Seroquel   3.   Paroxysmal A. fib.  On amiodarone and Eliquis, placed on beta-blocker for better rate control.  4.  Possible UTI.  3 days of Levaquin.  Multiple drug allergies.  Follow urine cultures.  5.  GERD.  On PPI.  6.  Hypertension.  Placed on beta-blocker will monitor and adjust.       Condition - Extremely Guarded  Family Communication  :  son Jeneen Rinks [phone] 04/03/21 >> DNR  Code Status :  DNR  Consults  :  None  PUD Prophylaxis : PPI   Procedures  :     MRI - 1. No acute intracranial abnormality. 2. Confluent hyperintense T2-weighted white matter signal, most consistent with chronic ischemic microangiopathy. 3. Generalized volume loss without a clear lobar predilection      Disposition Plan  :    Status is: Inpatient  Remains inpatient appropriate because: Covid infection, AMS   DVT Prophylaxis  :    SCDs Start: 04/02/21 2243 apixaban (ELIQUIS) tablet 5 mg     Lab Results  Component Value Date   PLT 246 04/03/2021    Diet :  Diet Order             Diet regular Room service appropriate? Yes; Fluid consistency: Thin  Diet effective now                    Inpatient Medications  Scheduled Meds:  amiodarone  100 mg Oral Daily   apixaban  5 mg Oral BID   ezetimibe  10 mg Oral Daily   metoprolol tartrate  50 mg Oral BID   QUEtiapine  25 mg Oral QHS   Continuous Infusions:  sodium chloride 50 mL/hr at 04/03/21 0217   levofloxacin (LEVAQUIN) IV     PRN Meds:.acetaminophen **OR** acetaminophen, albuterol, diltiazem, metoprolol tartrate  Antibiotics  :    Anti-infectives (From admission, onward)    Start     Dose/Rate Route Frequency Ordered Stop   04/03/21 1000  remdesivir 100 mg in sodium chloride 0.9 % 100 mL IVPB  Status:  Discontinued       See Hyperspace for full Linked Orders Report.   100 mg 200 mL/hr over 30 Minutes Intravenous Daily 04/02/21 2307 04/03/21 0118   04/03/21 1000  levofloxacin (LEVAQUIN) IVPB 500 mg        500 mg 100 mL/hr  over 60 Minutes Intravenous Every 24 hours 04/03/21 0950 04/06/21 0959   04/02/21 2330  remdesivir 200 mg in sodium chloride 0.9% 250 mL IVPB       See Hyperspace for full Linked Orders Report.   200 mg 580 mL/hr over 30 Minutes Intravenous Once 04/02/21 2307 04/03/21 0028        Time Spent in minutes  30   Lala Lund M.D on 04/03/2021 at 9:51 AM  To page go to [email or web address]   [facility] -  Office  [phone]  See all Orders from today for further details    Objective:   Vitals:   04/03/21 0504 04/03/21 0600 04/03/21 0700 04/03/21 0800  BP: (!) 145/81 (!) 145/104 (!) 141/89 125/85  Pulse: (!) 104 76 95 (!) 109  Resp: (!) 21 19 15 15   Temp:    98.7 F (37.1 C)  TempSrc:    Oral  SpO2: 99% 97% 97% 91%    Wt Readings from Last 3 Encounters:  02/15/21 68 kg  03/24/17 67.6 kg  03/11/17 67.6 kg     Intake/Output Summary (Last 24 hours) at 04/03/2021 0951 Last data filed at 04/03/2021 0745 Gross per 24 hour  Intake 500 ml  Output --  Net 500 ml     Physical Exam  Awake but extremely confused, No new F.N deficits,  [place].AT,PERRAL Supple Neck, No JVD,   Symmetrical Chest wall movement, Good air movement bilaterally, CTAB RRR,No Gallops,Rubs or new Murmurs,  +ve B.Sounds, Abd Soft, No tenderness,  No Cyanosis, Clubbing or edema        Data Review:    CBC Recent Labs  Lab 04/02/21 1818 04/03/21 0209 04/03/21 0306  WBC 12.2* 10.4  --   HGB 13.1 11.7* 12.6  HCT 41.7 37.9 37.0  PLT 303 246  --   MCV 95.4 97.4  --   MCH 30.0 30.1  --   MCHC 31.4 30.9  --   RDW 13.8 14.1  --   LYMPHSABS 1.6 1.4  --   MONOABS 1.5* 1.4*  --   EOSABS 0.2 0.2  --   BASOSABS 0.0 0.1  --     Electrolytes Recent Labs  Lab 04/02/21 1818 04/02/21 2300 04/03/21 0209 04/03/21 0210 04/03/21 0306  NA 137  --  137  --  138  K 4.3  --  3.6  --  3.6  CL 98  --  100  --   --   CO2 29  --  27  --   --   GLUCOSE 124*  --  106*  --   --   BUN 19  --  16  --   --    CREATININE 0.74  --  0.60  --   --   CALCIUM 9.2  --  8.7*  --   --   AST 22  --  17  --   --   ALT 20  --  17  --   --   ALKPHOS 68  --  62  --   --   BILITOT 1.1  --  0.9  --   --   ALBUMIN 3.1*  --  2.8*  --   --   MG  --  2.1 2.1  --   --   CRP  --  10.8* 11.2*  --   --   DDIMER  --  0.60*  --   --   --   PROCALCITON  --  <0.10  --   --   --   LATICACIDVEN  --  1.2  --   --   --   TSH  --   --   --  2.202  --   AMMONIA  --   --  12  --   --   BNP  --   --  135.1*  --   --     ------------------------------------------------------------------------------------------------------------------ Recent Labs    04/02/21 2300  TRIG 83    Lab Results  Component Value Date   HGBA1C 5.2 02/22/2017    Recent Labs    04/03/21 0210  TSH 2.202   ------------------------------------------------------------------------------------------------------------------ ID Labs Recent Labs  Lab 04/02/21 1818 04/02/21 2300 04/03/21 0209  WBC 12.2*  --  10.4  PLT 303  --  246  CRP  --  10.8* 11.2*  DDIMER  --  0.60*  --   PROCALCITON  --  <0.10  --   LATICACIDVEN  --  1.2  --   CREATININE 0.74  --  0.60   Cardiac Enzymes No results for input(s): CKMB, TROPONINI, MYOGLOBIN in the last 168 hours.  Invalid input(s): CK    Radiology Reports DG Chest 2 View  Result Date: 04/02/2021 CLINICAL DATA:  Altered mental status. EXAM: CHEST - 2 VIEW COMPARISON:  Chest radiograph dated 02/25/2021. FINDINGS: Mild eventration of the right hemidiaphragm. Minimal left lung base atelectasis/scarring. No focal consolidation, pleural effusion or pneumothorax. Stable mild cardiomegaly. Median sternotomy wires and CABG vascular clips. Atherosclerotic calcification  of the aorta. Degenerative changes of the spine. No acute osseous pathology. IMPRESSION: No active cardiopulmonary disease. Electronically Signed   By: Anner Crete M.D.   On: 04/02/2021 18:51   CT Head Wo Contrast  Result Date:  04/02/2021 CLINICAL DATA:  Altered mental status. EXAM: CT HEAD WITHOUT CONTRAST TECHNIQUE: Contiguous axial images were obtained from the base of the skull through the vertex without intravenous contrast. COMPARISON:  February 25, 2021 FINDINGS: Brain: There is mild cerebral atrophy with widening of the extra-axial spaces and ventricular dilatation. There are areas of decreased attenuation within the white matter tracts of the supratentorial brain, consistent with microvascular disease changes. A small right-sided para thalamic lacunar infarct is noted. Vascular: No hyperdense vessel or unexpected calcification. Skull: Normal. Negative for fracture or focal lesion. Sinuses/Orbits: There is mild left ethmoid sinus mucosal thickening. Other: None. IMPRESSION: 1. Generalized cerebral atrophy. 2. No acute intracranial abnormality. Electronically Signed   By: Virgina Norfolk M.D.   On: 04/02/2021 19:12   MR BRAIN WO CONTRAST  Result Date: 04/02/2021 CLINICAL DATA:  Dizziness and confusion with generalized weakness. EXAM: MRI HEAD WITHOUT CONTRAST TECHNIQUE: Multiplanar, multiecho pulse sequences of the brain and surrounding structures were obtained without intravenous contrast. COMPARISON:  02/21/2017 FINDINGS: Brain: No acute infarct, mass effect or extra-axial collection. No acute or chronic hemorrhage. Confluent hyperintense T2-weighted white matter signal. Generalized volume loss without a clear lobar predilection. The midline structures are normal. Vascular: Major flow voids are preserved. Skull and upper cervical spine: Normal calvarium and skull base. Visualized upper cervical spine and soft tissues are normal. Sinuses/Orbits:No paranasal sinus fluid levels or advanced mucosal thickening. No mastoid or middle ear effusion. Normal orbits. IMPRESSION: 1. No acute intracranial abnormality. 2. Confluent hyperintense T2-weighted white matter signal, most consistent with chronic ischemic microangiopathy. 3.  Generalized volume loss without a clear lobar predilection. ( Cerebral Atrophy (ICD10-G31.9).) Electronically Signed   By: Ulyses Jarred M.D.   On: 04/02/2021 22:02   DG Chest Port 1 View  Result Date: 04/03/2021 CLINICAL DATA:  Shortness of breath EXAM: PORTABLE CHEST 1 VIEW COMPARISON:  Chest radiograph 04/02/2021 FINDINGS: Median sternotomy wires and mediastinal surgical clips are stable. The cardiomediastinal silhouette is stable. There is asymmetric elevation of the right hemidiaphragm. There are persistent patchy opacities in the left lung base, not significantly changed. There is no new or worsening focal airspace disease. There is no significant pleural effusion. There is no pneumothorax. There is no acute osseous abnormality. IMPRESSION: Unchanged patchy opacities in the left lung base which may reflect atelectasis or infection. Electronically Signed   By: Valetta Mole M.D.   On: 04/03/2021 07:50

## 2021-04-03 NOTE — ED Notes (Signed)
Pt was having some low O2 readings, RN found that she had taken off her [place].  RN reapplied it and O2 level returned to normal levels.

## 2021-04-03 NOTE — ED Notes (Signed)
RN took pt off O2. O2 is 96% RA

## 2021-04-03 NOTE — ED Notes (Signed)
Pt O2 dropped when sleeping, placed 2 L [place].  O2 returned to WNL of 95%.

## 2021-04-03 NOTE — ED Notes (Signed)
RN called pts daughter to come sit with her. Pt is starting to get agitated and cursing at staff. Pt has dementia and seems like she is sun-downing. Family at bedside eased pt earlier.

## 2021-04-03 NOTE — ED Notes (Signed)
Pt very agitated when lab went in and woke her up.  RN was able to calm pt down and get blood.  RN restarted fluids

## 2021-04-04 ENCOUNTER — Encounter (HOSPITAL_COMMUNITY): Payer: Self-pay | Admitting: Internal Medicine

## 2021-04-04 ENCOUNTER — Other Ambulatory Visit: Payer: Self-pay

## 2021-04-04 DIAGNOSIS — G9341 Metabolic encephalopathy: Secondary | ICD-10-CM | POA: Diagnosis not present

## 2021-04-04 LAB — CBC WITH DIFFERENTIAL/PLATELET
Abs Immature Granulocytes: 0.07 10*3/uL (ref 0.00–0.07)
Basophils Absolute: 0 10*3/uL (ref 0.0–0.1)
Basophils Relative: 0 %
Eosinophils Absolute: 0 10*3/uL (ref 0.0–0.5)
Eosinophils Relative: 0 %
HCT: 36.1 % (ref 36.0–46.0)
Hemoglobin: 11.6 g/dL — ABNORMAL LOW (ref 12.0–15.0)
Immature Granulocytes: 1 %
Lymphocytes Relative: 7 %
Lymphs Abs: 0.9 10*3/uL (ref 0.7–4.0)
MCH: 30.2 pg (ref 26.0–34.0)
MCHC: 32.1 g/dL (ref 30.0–36.0)
MCV: 94 fL (ref 80.0–100.0)
Monocytes Absolute: 1 10*3/uL (ref 0.1–1.0)
Monocytes Relative: 7 %
Neutro Abs: 12 10*3/uL — ABNORMAL HIGH (ref 1.7–7.7)
Neutrophils Relative %: 85 %
Platelets: 251 10*3/uL (ref 150–400)
RBC: 3.84 MIL/uL — ABNORMAL LOW (ref 3.87–5.11)
RDW: 13.7 % (ref 11.5–15.5)
WBC: 14 10*3/uL — ABNORMAL HIGH (ref 4.0–10.5)
nRBC: 0 % (ref 0.0–0.2)

## 2021-04-04 LAB — COMPREHENSIVE METABOLIC PANEL
ALT: 19 U/L (ref 0–44)
AST: 19 U/L (ref 15–41)
Albumin: 2.5 g/dL — ABNORMAL LOW (ref 3.5–5.0)
Alkaline Phosphatase: 62 U/L (ref 38–126)
Anion gap: 11 (ref 5–15)
BUN: 21 mg/dL (ref 8–23)
CO2: 26 mmol/L (ref 22–32)
Calcium: 8.8 mg/dL — ABNORMAL LOW (ref 8.9–10.3)
Chloride: 99 mmol/L (ref 98–111)
Creatinine, Ser: 0.67 mg/dL (ref 0.44–1.00)
GFR, Estimated: >60 mL/min (ref >60)
Glucose, Bld: 135 mg/dL — ABNORMAL HIGH (ref 70–99)
Potassium: 3.9 mmol/L (ref 3.5–5.1)
Sodium: 136 mmol/L (ref 135–145)
Total Bilirubin: 0.7 mg/dL (ref 0.3–1.2)
Total Protein: 5.9 g/dL — ABNORMAL LOW (ref 6.5–8.1)

## 2021-04-04 LAB — MAGNESIUM: Magnesium: 2.1 mg/dL (ref 1.7–2.4)

## 2021-04-04 LAB — PROCALCITONIN: Procalcitonin: <0.1 ng/mL

## 2021-04-04 LAB — D-DIMER, QUANTITATIVE: D-Dimer, Quant: 0.39 ug/mL-FEU (ref 0.00–0.50)

## 2021-04-04 LAB — BRAIN NATRIURETIC PEPTIDE: B Natriuretic Peptide: 291.2 pg/mL — ABNORMAL HIGH (ref 0.0–100.0)

## 2021-04-04 LAB — C-REACTIVE PROTEIN: CRP: 12.5 mg/dL — ABNORMAL HIGH (ref <1.0)

## 2021-04-04 MED ORDER — SODIUM CHLORIDE 0.9 % IV SOLN: INTRAVENOUS | Status: AC

## 2021-04-04 NOTE — Evaluation (Signed)
Occupational Therapy Evaluation Patient Details Name: Marissa Bowen MRN: [id number] DOB: 08-21-1926 Today's Date: 04/04/2021   History of Present Illness Pt is a 87 y.o. female admitted from [facility] on 04/02/21 with AMS, including increased somnolence, poor oral intake, decreased mobility; of note recent workup for UTI 11/30 but no change in status. Workup for acute metabolic encephalopathy, UTI, (+) COVID-19. Brain imaging negative for acute injury. PMH includes CVA, CAD, HTN, PAF, OA, anxiety, dementia.   Clinical Impression   Pt received in chair with c/o pain in her buttocks. Pt able to eat with set up. Mod assist for pt to don shoes and returned to bed with min assist. Pt somewhat agitated, limited evaluation. Pt likely assisted for bathing and dressing at her ALF. VSS on RA. Recommend return to ALF when medically ready. Will follow acutely.     Recommendations for follow up therapy are one component of a multi-disciplinary discharge planning process, led by the attending physician.  Recommendations may be updated based on patient status, additional functional criteria and insurance authorization.   Follow Up Recommendations   (Return to ALF)    Assistance Recommended at Discharge Frequent or constant Supervision/Assistance  Functional Status Assessment  Patient has had a recent decline in their functional status and/or demonstrates limited ability to make significant improvements in function in a reasonable and predictable amount of time  Equipment Recommendations  None recommended by OT    Recommendations for Other Services       Precautions / Restrictions Precautions Precautions: Fall Restrictions Weight Bearing Restrictions: No      Mobility Bed Mobility Overal bed mobility: Needs Assistance Bed Mobility: Sit to Sidelying     Supine to sit: Supervision;HOB elevated   Sit to sidelying: Supervision General bed mobility comments: pt returned to bed from chair     Transfers Overall transfer level: Needs assistance Equipment used: 1 person hand held assist Transfers: Sit to/from Stand;Bed to chair/wheelchair/BSC Sit to Stand: Min assist Stand pivot transfers: Min assist        General transfer comment: hand held to rise and steady, decreased control of descent      Balance Overall balance assessment: Needs assistance Sitting-balance support: No upper extremity supported Sitting balance-Leahy Scale: Fair Sitting balance - Comments: assist to don bilateral shoes   Standing balance support: Single extremity supported Standing balance-Leahy Scale: Poor Standing balance comment: Reliant on UE support                           ADL either performed or assessed with clinical judgement   ADL Overall ADL's : Needs assistance/impaired Eating/Feeding: Set up;Sitting   Grooming: Brushing hair;Sitting;Minimal assistance   Upper Body Bathing: Moderate assistance;Sitting   Lower Body Bathing: Moderate assistance;Sit to/from stand   Upper Body Dressing : Minimal assistance;Sitting   Lower Body Dressing: Moderate assistance;Sitting/lateral leans Lower Body Dressing Details (indicate cue type and reason): shoes Toilet Transfer: Minimal assistance;Stand-pivot;Rolling walker (2 wheels)                   Vision Patient Visual Report: No change from baseline       Perception     Praxis      Pertinent Vitals/Pain Pain Assessment: Faces Faces Pain Scale: Hurts even more Pain Location: buttocks Pain Descriptors / Indicators: Sore Pain Intervention(s): Repositioned     Hand Dominance Right   Extremity/Trunk Assessment Upper Extremity Assessment Upper Extremity Assessment: Generalized weakness (tremor)  Lower Extremity Assessment Lower Extremity Assessment: Defer to PT evaluation   Cervical / Trunk Assessment Cervical / Trunk Assessment: Kyphotic   Communication Communication Communication: No difficulties    Cognition Arousal/Alertness: Awake/alert Behavior During Therapy: Flat affect;Agitated Overall Cognitive Status: History of cognitive impairments - at baseline   General Comments: pt with h/o dementia     General Comments    Exercises     Shoulder Instructions      Home Living Family/patient expects to be discharged to:: Assisted living                             Home Equipment: Rolling Walker (2 wheels)   Additional Comments: Resident at [facility]F      Prior Functioning/Environment Prior Level of Function : Needs assist  Cognitive Assist : Mobility (cognitive);ADLs (cognitive)     Physical Assist : Mobility (physical);ADLs (physical)     Mobility Comments: Pt reports ambulatory with RW; suspect staff assists with mobility as needed ADLs Comments: Pt reports meals provided, staff assist with bathing, dressing        OT Problem List: Impaired balance (sitting and/or standing);Decreased activity tolerance;Decreased strength;Decreased coordination;Decreased knowledge of use of DME or AE;Pain      OT Treatment/Interventions: Self-care/ADL training;DME and/or AE instruction;Patient/family education;Balance training;Therapeutic activities    OT Goals(Current goals can be found in the care plan section) Acute Rehab OT Goals OT Goal Formulation: Patient unable to participate in goal setting Time For Goal Achievement: 04/18/21 Potential to Achieve Goals: Fair ADL Goals Pt Will Perform Grooming: with supervision;standing Pt Will Transfer to Toilet: with supervision;ambulating;bedside commode Pt Will Perform Toileting - Clothing Manipulation and hygiene: with supervision;sit to/from stand Additional ADL Goal #1: Pt will perform bed mobility independently in preparation for ADL.  OT Frequency: Min 2X/week   Barriers to D/C:            Co-evaluation              AM-PAC OT "6 Clicks" Daily Activity     Outcome Measure Help from another person  eating meals?: A Little Help from another person taking care of personal grooming?: A Little Help from another person toileting, which includes using toliet, bedpan, or urinal?: A Lot Help from another person bathing (including washing, rinsing, drying)?: A Lot Help from another person to put on and taking off regular upper body clothing?: A Little Help from another person to put on and taking off regular lower body clothing?: A Lot 6 Click Score: 15   End of Session Equipment Utilized During Treatment: Gait belt  Activity Tolerance: Treatment limited secondary to agitation Patient left: in bed;with call bell/phone within reach;with bed alarm set  OT Visit Diagnosis: Unsteadiness on feet (R26.81);Other abnormalities of gait and mobility (R26.89);Muscle weakness (generalized) (M62.81);Other symptoms and signs involving the nervous system (R29.898)                Time: 5374-8270 OT Time Calculation (min): 16 min Charges:  OT General Charges $OT Visit: 1 Visit OT Evaluation $OT Eval Moderate Complexity: 1 Mod  Nestor Lewandowsky, OTR/L Acute Rehabilitation Services Pager: [phone] Office: [phone]   Malka So 04/04/2021, 2:05 PM

## 2021-04-04 NOTE — Evaluation (Signed)
Physical Therapy Evaluation Patient Details Name: Marissa Bowen MRN: [id number] DOB: 03-26-1927 Today's Date: 04/04/2021  History of Present Illness  Pt is a 87 y.o. female admitted from [facility] on 04/02/21 with AMS, including increased somnolence, poor oral intake, decreased mobility; of note recent workup for UTI 11/30 but no change in status. Workup for acute metabolic encephalopathy, UTI, (+) COVID-19. Brain imaging negative for acute injury. PMH includes CVA, CAD, HTN, PAF, OA, anxiety, dementia.   Clinical Impression  Pt presents with an overall decrease in functional mobility secondary to above. PTA, pt resident at [facility]; pt reports ambulatory with walker, staff assists with ADLs as needed. Today, pt requiring minA for brief bout of standing activity; mobility progression limited by fatigue, pt endorses feeling weak. Pt would benefit from continued acute PT services to maximize functional mobility and independence prior to return to ALF if staff able to continue providing necessary assist.      SpO2 92-96% on RA activity   Recommendations for follow up therapy are one component of a multi-disciplinary discharge planning process, led by the attending physician.  Recommendations may be updated based on patient status, additional functional criteria and insurance authorization.  Follow Up Recommendations Home health PT (at ALF)    Assistance Recommended at Discharge Frequent or constant Supervision/Assistance  Functional Status Assessment Patient has had a recent decline in their functional status and demonstrates the ability to make significant improvements in function in a reasonable and predictable amount of time.  Equipment Recommendations  None recommended by PT    Recommendations for Other Services       Precautions / Restrictions Precautions Precautions: Fall Restrictions Weight Bearing Restrictions: No      Mobility  Bed Mobility Overal bed mobility: Needs  Assistance Bed Mobility: Supine to Sit     Supine to sit: Supervision;HOB elevated          Transfers Overall transfer level: Needs assistance Equipment used: 1 person hand held assist Transfers: Sit to/from Stand;Bed to chair/wheelchair/BSC Sit to Stand: Min assist   Step pivot transfers: Min assist       General transfer comment: Reliant on HHA and minA to elevate trunk; pt reaching for additional UE support to stabilize once standing; reliant on minA for bilateral HHA to step pivot from bed to recliner    Ambulation/Gait               General Gait Details: deferred secondary to fatigue  Stairs            Wheelchair Mobility    Modified Rankin (Stroke Patients Only)       Balance Overall balance assessment: Needs assistance Sitting-balance support: No upper extremity supported Sitting balance-Leahy Scale: Fair Sitting balance - Comments: assist to don bilateral shoes   Standing balance support: Single extremity supported;Bilateral upper extremity supported Standing balance-Leahy Scale: Poor Standing balance comment: Reliant on UE support                             Pertinent Vitals/Pain Pain Assessment: No/denies pain    Home Living Family/patient expects to be discharged to:: Assisted living                 Home Equipment: Conservation officer, nature (2 wheels) Additional Comments: Resident at [facility]    Prior Function Prior Level of Function : Needs assist  Cognitive Assist : Mobility (cognitive);ADLs (cognitive)     Physical Assist :  Mobility (physical);ADLs (physical)     Mobility Comments: Pt reports ambulatory with RW; suspect staff assists with mobility as needed ADLs Comments: Pt reports meals provided, staff assist with bathing     Hand Dominance        Extremity/Trunk Assessment   Upper Extremity Assessment Upper Extremity Assessment: Generalized weakness    Lower Extremity Assessment Lower Extremity  Assessment: Generalized weakness       Communication   Communication: HOH  Cognition Arousal/Alertness: Awake/alert Behavior During Therapy: WFL for tasks assessed/performed;Flat affect Overall Cognitive Status: History of cognitive impairments - at baseline Area of Impairment: Orientation;Attention;Memory;Following commands;Safety/judgement;Awareness;Problem solving                 Orientation Level: Disoriented to;Place;Time;Situation Current Attention Level: Sustained;Selective Memory: Decreased short-term memory Following Commands: Follows one step commands consistently;Follows one step commands with increased time;Follows multi-step commands inconsistently Safety/Judgement: Decreased awareness of deficits Awareness: Intellectual Problem Solving: Slow processing;Decreased initiation;Difficulty sequencing;Requires verbal cues General Comments: Pt with h/o dementia. Aware she lives at [facility] and able to discuss PLOF; disoriented to current place, time and situation        General Comments General comments (skin integrity, edema, etc.): SpO2 92-96% on RA activity (RN notified), no SOB noted    Exercises     Assessment/Plan    PT Assessment Patient needs continued PT services  PT Problem List Decreased strength;Decreased activity tolerance;Decreased balance;Decreased mobility;Decreased cognition;Cardiopulmonary status limiting activity       PT Treatment Interventions DME instruction;Gait training;Functional mobility training;Therapeutic activities;Therapeutic exercise;Balance training;Patient/family education    PT Goals (Current goals can be found in the Care Plan section)  Acute Rehab PT Goals Patient Stated Goal: "I'd like to go back to [place], things are going well there" PT Goal Formulation: With patient Time For Goal Achievement: 04/18/21 Potential to Achieve Goals: Good    Frequency Min 3X/week   Barriers to discharge        Co-evaluation                AM-PAC PT "6 Clicks" Mobility  Outcome Measure Help needed turning from your back to your side while in a flat bed without using bedrails?: A Little Help needed moving from lying on your back to sitting on the side of a flat bed without using bedrails?: A Little Help needed moving to and from a bed to a chair (including a wheelchair)?: A Little Help needed standing up from a chair using your arms (e.g., wheelchair or bedside chair)?: A Little Help needed to walk in hospital room?: A Little Help needed climbing 3-5 steps with a railing? : A Lot 6 Click Score: 17    End of Session   Activity Tolerance: Patient tolerated treatment well;Patient limited by fatigue Patient left: in chair;with call bell/phone within reach;with chair alarm set Nurse Communication: Mobility status;Other (comment) (no cord connecting chair alarm box to wall) PT Visit Diagnosis: Other abnormalities of gait and mobility (R26.89);Muscle weakness (generalized) (M62.81)    Time: 1224-8250 PT Time Calculation (min) (ACUTE ONLY): 25 min   Charges:   PT Evaluation $PT Eval Moderate Complexity: [place], PT, DPT Acute Rehabilitation Services  Pager [phone] Office [place] 04/04/2021, 12:10 PM

## 2021-04-04 NOTE — Plan of Care (Signed)
  Problem: Coping: Goal: Psychosocial and spiritual needs will be supported Outcome: Progressing   Problem: Respiratory: Goal: Will maintain a patent airway Outcome: Progressing Goal: Complications related to the disease process, condition or treatment will be avoided or minimized Outcome: Progressing   

## 2021-04-04 NOTE — Progress Notes (Signed)
PROGRESS NOTE                                                                                                                                                                                                             Patient Demographics:    Marissa Bowen, is a 87 y.o. female, DOB - Jul 25, 1926, ACZ:[id number]  Outpatient Primary MD for the patient is System, Provider Not In    LOS - 2  Admit date - 04/02/2021    Chief Complaint  Patient presents with   Altered Mental Status       Brief Narrative (HPI from H&P)   Marissa Bowen is a 87 y.o. female with medical history significant for dementia, paroxysmal atrial fibrillation chronically anticoagulated on Eliquis, essential hypertension, chronic diastolic heart failure, who is admitted to [facility] on 04/02/2021 with acute metabolic encephalopathy after presenting from ALF to [facility] ED for evaluation of altered mental status, she was diagnosed with Covid PNA, UTI, AMS.   Subjective:   Patient in bed, appears comfortable,  mildly confused, denies any headache, no fever, no chest pain or pressure, no shortness of breath , no abdominal pain. No new focal weakness.    Assessment  & Plan :     Acute Hypoxic Resp. Failure due to Acute Covid 19 Viral Pneumonitis with Acute Toxic Encephalopathy - she has moderate inflammation as suggested by her CRP, chest x-ray has an infiltrate, she was mildly hypoxic, she has been placed on steroids and Remdesivir.  Overall due to her age her condition is tenuous.  Poor candidate for Actemra or aggressive measures.  Fortunately with supportive care she is improving, there is no sepsis.  Continue supportive care and close monitoring..  Encouraged the patient to sit up in chair in the daytime use I-S and flutter valve for pulmonary toiletry.  Will advance activity and titrate down oxygen as possible.   2.  Acute toxic encephalopathy.  Due to  underlying dementia and #1 above, supportive care, minimize narcotics and benzodiazepines, Haldol as needed if needed.  MRI brain nonacute.  No focal deficits.  Continue nighttime Seroquel, overall mentation has improved when considering underlying dementia and hospital-acquired delirium.   3.  Paroxysmal A. fib.  On amiodarone and Eliquis, placed on beta-blocker for better rate control.  4.  Possible UTI.  3 days  of Levaquin.  Multiple drug allergies.  Follow urine cultures.  5.  GERD.  On PPI.  6.  Hypertension.  Placed on beta-blocker will monitor and adjust.       Condition - Extremely Guarded  Family Communication  :   Dan Humphreys [phone] 04/03/21 >> DNR  Daughter [name] [phone] on 04/04/21  Code Status :  DNR  Consults  :  None  PUD Prophylaxis : PPI   Procedures  :     MRI - 1. No acute intracranial abnormality. 2. Confluent hyperintense T2-weighted white matter signal, most consistent with chronic ischemic microangiopathy. 3. Generalized volume loss without a clear lobar predilection      Disposition Plan  :    Status is: Inpatient  Remains inpatient appropriate because: Covid infection, AMS   DVT Prophylaxis  :    SCDs Start: 04/02/21 2243 apixaban (ELIQUIS) tablet 5 mg     Lab Results  Component Value Date   PLT 251 04/04/2021    Diet :  Diet Order             Diet regular Room service appropriate? Yes; Fluid consistency: Thin  Diet effective now                    Inpatient Medications  Scheduled Meds:  amiodarone  100 mg Oral Daily   apixaban  5 mg Oral BID   dexamethasone (DECADRON) injection  10 mg Intravenous Q24H   divalproex  250 mg Oral BID   DULoxetine  60 mg Oral QHS   ezetimibe  10 mg Oral Daily   metoprolol tartrate  50 mg Oral BID   pantoprazole  40 mg Oral Daily   QUEtiapine  25 mg Oral QHS   Continuous Infusions:  sodium chloride     levofloxacin (LEVAQUIN) IV 250 mg (04/04/21 1114)   PRN  Meds:.acetaminophen **OR** acetaminophen, albuterol, diltiazem, metoprolol tartrate  Antibiotics  :    Anti-infectives (From admission, onward)    Start     Dose/Rate Route Frequency Ordered Stop   04/03/21 1000  remdesivir 100 mg in sodium chloride 0.9 % 100 mL IVPB  Status:  Discontinued       See Hyperspace for full Linked Orders Report.   100 mg 200 mL/hr over 30 Minutes Intravenous Daily 04/02/21 2307 04/03/21 0118   04/03/21 1000  Levofloxacin (LEVAQUIN) IVPB 250 mg        250 mg 50 mL/hr over 60 Minutes Intravenous Every 24 hours 04/03/21 0950 04/06/21 0959   04/02/21 2330  remdesivir 200 mg in sodium chloride 0.9% 250 mL IVPB       See Hyperspace for full Linked Orders Report.   200 mg 580 mL/hr over 30 Minutes Intravenous Once 04/02/21 2307 04/03/21 0028        Time Spent in minutes  30   Lala Lund M.D on 04/04/2021 at 11:50 AM  To page go to [email or web address]   [facility] -  Office  [phone]  See all Orders from today for further details    Objective:   Vitals:   04/03/21 2039 04/03/21 2237 04/04/21 0400 04/04/21 0837  BP: (!) 145/99 127/87 (!) 107/58 132/61  Pulse: (!) 101 96 92 99  Resp: 14 16 17    Temp: 98.6 F (37 C) 97.9 F (36.6 C) 97.9 F (36.6 C)   TempSrc: Oral Oral Oral   SpO2: 96% 95% 94%   Weight: 74.9 kg  74.9 kg   Height: 5\' 5"  (  1.651 m)       Wt Readings from Last 3 Encounters:  04/04/21 74.9 kg  02/15/21 68 kg  03/24/17 67.6 kg     Intake/Output Summary (Last 24 hours) at 04/04/2021 1150 Last data filed at 04/04/2021 0437 Gross per 24 hour  Intake 50 ml  Output 100 ml  Net -50 ml     Physical Exam  Awake Alert x 2, No new F.N deficits,   [place].AT,PERRAL Supple Neck, No JVD,   Symmetrical Chest wall movement, Good air movement bilaterally, CTAB RRR,No Gallops, Rubs or new Murmurs,  +ve B.Sounds, Abd Soft, No tenderness,   No Cyanosis, Clubbing or edema        Data Review:    CBC Recent Labs  Lab  04/02/21 1818 04/03/21 0209 04/03/21 0306 04/04/21 0553  WBC 12.2* 10.4  --  14.0*  HGB 13.1 11.7* 12.6 11.6*  HCT 41.7 37.9 37.0 36.1  PLT 303 246  --  251  MCV 95.4 97.4  --  94.0  MCH 30.0 30.1  --  30.2  MCHC 31.4 30.9  --  32.1  RDW 13.8 14.1  --  13.7  LYMPHSABS 1.6 1.4  --  0.9  MONOABS 1.5* 1.4*  --  1.0  EOSABS 0.2 0.2  --  0.0  BASOSABS 0.0 0.1  --  0.0    Electrolytes Recent Labs  Lab 04/02/21 1818 04/02/21 2300 04/03/21 0209 04/03/21 0210 04/03/21 0306 04/04/21 0553  NA 137  --  137  --  138 136  K 4.3  --  3.6  --  3.6 3.9  CL 98  --  100  --   --  99  CO2 29  --  27  --   --  26  GLUCOSE 124*  --  106*  --   --  135*  BUN 19  --  16  --   --  21  CREATININE 0.74  --  0.60  --   --  0.67  CALCIUM 9.2  --  8.7*  --   --  8.8*  AST 22  --  17  --   --  19  ALT 20  --  17  --   --  19  ALKPHOS 68  --  62  --   --  62  BILITOT 1.1  --  0.9  --   --  0.7  ALBUMIN 3.1*  --  2.8*  --   --  2.5*  MG  --  2.1 2.1  --   --  2.1  CRP  --  10.8* 11.2*  --   --  12.5*  DDIMER  --  0.60*  --   --   --  0.39  PROCALCITON  --  <0.10  --   --   --  <0.10  LATICACIDVEN  --  1.2  --   --   --   --   TSH  --   --   --  2.202  --   --   AMMONIA  --   --  12  --   --   --   BNP  --   --  135.1*  --   --  291.2*    ------------------------------------------------------------------------------------------------------------------ Recent Labs    04/02/21 2300  TRIG 83    Lab Results  Component Value Date   HGBA1C 5.2 02/22/2017    Recent Labs    04/03/21 0210  TSH 2.202   ------------------------------------------------------------------------------------------------------------------  ID Labs Recent Labs  Lab 04/02/21 1818 04/02/21 2300 04/03/21 0209 04/04/21 0553  WBC 12.2*  --  10.4 14.0*  PLT 303  --  246 251  CRP  --  10.8* 11.2* 12.5*  DDIMER  --  0.60*  --  0.39  PROCALCITON  --  <0.10  --  <0.10  LATICACIDVEN  --  1.2  --   --   CREATININE  0.74  --  0.60 0.67   Cardiac Enzymes No results for input(s): CKMB, TROPONINI, MYOGLOBIN in the last 168 hours.  Invalid input(s): CK    Radiology Reports DG Chest 2 View  Result Date: 04/02/2021 CLINICAL DATA:  Altered mental status. EXAM: CHEST - 2 VIEW COMPARISON:  Chest radiograph dated 02/25/2021. FINDINGS: Mild eventration of the right hemidiaphragm. Minimal left lung base atelectasis/scarring. No focal consolidation, pleural effusion or pneumothorax. Stable mild cardiomegaly. Median sternotomy wires and CABG vascular clips. Atherosclerotic calcification of the aorta. Degenerative changes of the spine. No acute osseous pathology. IMPRESSION: No active cardiopulmonary disease. Electronically Signed   By: Anner Crete M.D.   On: 04/02/2021 18:51   CT Head Wo Contrast  Result Date: 04/02/2021 CLINICAL DATA:  Altered mental status. EXAM: CT HEAD WITHOUT CONTRAST TECHNIQUE: Contiguous axial images were obtained from the base of the skull through the vertex without intravenous contrast. COMPARISON:  February 25, 2021 FINDINGS: Brain: There is mild cerebral atrophy with widening of the extra-axial spaces and ventricular dilatation. There are areas of decreased attenuation within the white matter tracts of the supratentorial brain, consistent with microvascular disease changes. A small right-sided para thalamic lacunar infarct is noted. Vascular: No hyperdense vessel or unexpected calcification. Skull: Normal. Negative for fracture or focal lesion. Sinuses/Orbits: There is mild left ethmoid sinus mucosal thickening. Other: None. IMPRESSION: 1. Generalized cerebral atrophy. 2. No acute intracranial abnormality. Electronically Signed   By: Virgina Norfolk M.D.   On: 04/02/2021 19:12   MR BRAIN WO CONTRAST  Result Date: 04/02/2021 CLINICAL DATA:  Dizziness and confusion with generalized weakness. EXAM: MRI HEAD WITHOUT CONTRAST TECHNIQUE: Multiplanar, multiecho pulse sequences of the brain and  surrounding structures were obtained without intravenous contrast. COMPARISON:  02/21/2017 FINDINGS: Brain: No acute infarct, mass effect or extra-axial collection. No acute or chronic hemorrhage. Confluent hyperintense T2-weighted white matter signal. Generalized volume loss without a clear lobar predilection. The midline structures are normal. Vascular: Major flow voids are preserved. Skull and upper cervical spine: Normal calvarium and skull base. Visualized upper cervical spine and soft tissues are normal. Sinuses/Orbits:No paranasal sinus fluid levels or advanced mucosal thickening. No mastoid or middle ear effusion. Normal orbits. IMPRESSION: 1. No acute intracranial abnormality. 2. Confluent hyperintense T2-weighted white matter signal, most consistent with chronic ischemic microangiopathy. 3. Generalized volume loss without a clear lobar predilection. ( Cerebral Atrophy (ICD10-G31.9).) Electronically Signed   By: Ulyses Jarred M.D.   On: 04/02/2021 22:02   DG Chest Port 1 View  Result Date: 04/03/2021 CLINICAL DATA:  Shortness of breath EXAM: PORTABLE CHEST 1 VIEW COMPARISON:  Chest radiograph 04/02/2021 FINDINGS: Median sternotomy wires and mediastinal surgical clips are stable. The cardiomediastinal silhouette is stable. There is asymmetric elevation of the right hemidiaphragm. There are persistent patchy opacities in the left lung base, not significantly changed. There is no new or worsening focal airspace disease. There is no significant pleural effusion. There is no pneumothorax. There is no acute osseous abnormality. IMPRESSION: Unchanged patchy opacities in the left lung base which may reflect atelectasis or infection. Electronically  Signed   By: Valetta Mole M.D.   On: 04/03/2021 07:50

## 2021-04-04 NOTE — Plan of Care (Signed)
  Problem: Education: Goal: Knowledge of risk factors and measures for prevention of condition will improve Outcome: Progressing   Problem: Coping: Goal: Psychosocial and spiritual needs will be supported Outcome: Progressing   Problem: Respiratory: Goal: Will maintain a patent airway Outcome: Progressing Goal: Complications related to the disease process, condition or treatment will be avoided or minimized Outcome: Progressing   

## 2021-04-05 DIAGNOSIS — G9341 Metabolic encephalopathy: Secondary | ICD-10-CM | POA: Diagnosis not present

## 2021-04-05 LAB — COMPREHENSIVE METABOLIC PANEL
ALT: 21 U/L (ref 0–44)
AST: 23 U/L (ref 15–41)
Albumin: 2.4 g/dL — ABNORMAL LOW (ref 3.5–5.0)
Alkaline Phosphatase: 60 U/L (ref 38–126)
Anion gap: 8 (ref 5–15)
BUN: 25 mg/dL — ABNORMAL HIGH (ref 8–23)
CO2: 26 mmol/L (ref 22–32)
Calcium: 8.8 mg/dL — ABNORMAL LOW (ref 8.9–10.3)
Chloride: 102 mmol/L (ref 98–111)
Creatinine, Ser: 0.68 mg/dL (ref 0.44–1.00)
GFR, Estimated: >60 mL/min (ref >60)
Glucose, Bld: 139 mg/dL — ABNORMAL HIGH (ref 70–99)
Potassium: 4.5 mmol/L (ref 3.5–5.1)
Sodium: 136 mmol/L (ref 135–145)
Total Bilirubin: 0.5 mg/dL (ref 0.3–1.2)
Total Protein: 5.9 g/dL — ABNORMAL LOW (ref 6.5–8.1)

## 2021-04-05 LAB — CBC WITH DIFFERENTIAL/PLATELET
Abs Immature Granulocytes: 0.08 10*3/uL — ABNORMAL HIGH (ref 0.00–0.07)
Basophils Absolute: 0 10*3/uL (ref 0.0–0.1)
Basophils Relative: 0 %
Eosinophils Absolute: 0 10*3/uL (ref 0.0–0.5)
Eosinophils Relative: 0 %
HCT: 35.5 % — ABNORMAL LOW (ref 36.0–46.0)
Hemoglobin: 11.2 g/dL — ABNORMAL LOW (ref 12.0–15.0)
Immature Granulocytes: 1 %
Lymphocytes Relative: 7 %
Lymphs Abs: 1 10*3/uL (ref 0.7–4.0)
MCH: 29.9 pg (ref 26.0–34.0)
MCHC: 31.5 g/dL (ref 30.0–36.0)
MCV: 94.9 fL (ref 80.0–100.0)
Monocytes Absolute: 0.7 10*3/uL (ref 0.1–1.0)
Monocytes Relative: 5 %
Neutro Abs: 12.1 10*3/uL — ABNORMAL HIGH (ref 1.7–7.7)
Neutrophils Relative %: 87 %
Platelets: 279 10*3/uL (ref 150–400)
RBC: 3.74 MIL/uL — ABNORMAL LOW (ref 3.87–5.11)
RDW: 13.9 % (ref 11.5–15.5)
WBC: 13.9 10*3/uL — ABNORMAL HIGH (ref 4.0–10.5)
nRBC: 0 % (ref 0.0–0.2)

## 2021-04-05 LAB — MAGNESIUM: Magnesium: 2.1 mg/dL (ref 1.7–2.4)

## 2021-04-05 LAB — PROCALCITONIN: Procalcitonin: <0.1 ng/mL

## 2021-04-05 LAB — D-DIMER, QUANTITATIVE: D-Dimer, Quant: 0.4 ug/mL-FEU (ref 0.00–0.50)

## 2021-04-05 LAB — C-REACTIVE PROTEIN: CRP: 5.9 mg/dL — ABNORMAL HIGH (ref <1.0)

## 2021-04-05 LAB — BRAIN NATRIURETIC PEPTIDE: B Natriuretic Peptide: 272.5 pg/mL — ABNORMAL HIGH (ref 0.0–100.0)

## 2021-04-05 NOTE — Discharge Summary (Signed)
Marissa Bowen:[id number] DOB: October 26, 1926 DOA: 04/02/2021  PCP: System, Provider Not In  Admit date: 04/02/2021  Discharge date: 04/05/2021  Admitted From: ALF/SNF   Disposition:  ALF/SNF   Recommendations for Outpatient Follow-up:   Follow up with PCP in 1-2 weeks  PCP Please obtain BMP/CBC, 2 view CXR in 1week,  (see Discharge instructions)   PCP Please follow up on the following pending results:    Home Health: PT,OT Equipment/Devices: As below Consultations: None  Discharge Condition: Stable    CODE STATUS: Full    Diet Recommendation: Heart Healthy   Diet Order             Diet regular Room service appropriate? Yes; Fluid consistency: Thin  Diet effective now                    Chief Complaint  Patient presents with   Altered Mental Status     Brief history of present illness from the day of admission and additional interim summary    Marissa Bowen is a 87 y.o. female with medical history significant for dementia, paroxysmal atrial fibrillation chronically anticoagulated on Eliquis, essential hypertension, chronic diastolic heart failure, who is admitted to [facility] on 04/02/2021 with acute metabolic encephalopathy after presenting from ALF to [facility] ED for evaluation of altered mental status, she was diagnosed with Covid PNA, UTI, AMS.                                                                 Hospital Course   Acute Hypoxic Resp. Failure due to Acute Covid 19 Viral Pneumonitis with Acute Toxic Encephalopathy - she has moderate inflammation as suggested by her CRP, chest x-ray has an infiltrate, she was mildly hypoxic, she given a short course of steroids along with remdesivir with good resolution of symptoms and improvement in inflammatory markers.  She is now back to baseline.   Symptom-free.  Much of her issues were dehydration related as well which have improved with hydration.  Will be discharged back to ALF on home medications, PCP to monitor.  In no distress whatsoever now.   2.  Acute toxic encephalopathy.  Due to underlying dementia and #1 above, supportive care, minimize narcotics and benzodiazepines, Haldol as needed if needed.  MRI brain nonacute.  No focal deficits. Continue nighttime Seroquel, overall mentation has improved when considering underlying dementia and hospital-acquired delirium.  Mentation now is close to baseline she is oriented x2.     3.  Paroxysmal A. fib.  On home dose amiodarone and Eliquis, placed on beta-blocker for better rate control.   4.  Possible UTI.  3 days of Levaquin along with gentle hydration with IV fluids.   5.  GERD.  On PPI.   6.  Hypertension.  Stable on beta-blocker PCP to monitor and adjust as needed.  Discharge diagnosis     Principal Problem:   Acute metabolic encephalopathy Active Problems:   Hyperlipidemia LDL goal <100   GERD   Atrial fibrillation (HCC)   Severe sepsis ([place])   COVID-19 virus infection   Dehydration   Acute prerenal azotemia    Discharge instructions    Discharge Instructions     Discharge instructions   Complete by: As directed    Follow with Primary MD System, Provider Not In in 7 days   Get CBC, CMP, 2 view Chest X ray -  checked next visit within 1 week by Primary MD or SNF MD   Activity: As tolerated with Full fall precautions use walker/cane & assistance as needed  Disposition  SNF  Diet: Heart Healthy with feeding assistance and aspiration precautions.  Special Instructions: If you have smoked or chewed Tobacco  in the last 2 yrs please stop smoking, stop any regular Alcohol  and or any Recreational drug use.  On your next visit with your primary care physician please Get Medicines reviewed and adjusted.  Please request your Prim.MD to go over all Hospital Tests  and Procedure/Radiological results at the follow up, please get all Hospital records sent to your Prim MD by signing hospital release before you go home.  If you experience worsening of your admission symptoms, develop shortness of breath, life threatening emergency, suicidal or homicidal thoughts you must seek medical attention immediately by calling 911 or calling your MD immediately  if symptoms less severe.  You Must read complete instructions/literature along with all the possible adverse reactions/side effects for all the Medicines you take and that have been prescribed to you. Take any new Medicines after you have completely understood and accpet all the possible adverse reactions/side effects.   Increase activity slowly   Complete by: As directed        Discharge Medications   Allergies as of 04/05/2021       Reactions   Asa [aspirin] Other (See Comments)   Bleeding ulcer Per MAR   Crestor [rosuvastatin Calcium] Other (See Comments)   Myalgia Per MAR   Hydrocodone Other (See Comments)   Migraines Per MAR   Ibuprofen Other (See Comments)   Ulcers Per MAR   Oxycodone Hcl Other (See Comments)    Migraines Per MAR   Penicillins Swelling, Rash   Per MAR Has patient had a PCN reaction causing immediate rash, facial/tongue/throat swelling, SOB or lightheadedness with hypotension: Yes Has patient had a PCN reaction causing severe rash involving mucus membranes or skin necrosis: No Has patient had a PCN reaction that required hospitalization No Has patient had a PCN reaction occurring within the last 10 years: No If all of the above answers are "NO", then may proceed with Cephalosporin use.   Prednisone Other (See Comments)   Ulcers Per MAR   Statins Other (See Comments)   Aches and pains Per MAR   Morphine And Related Other (See Comments)    Had hallucinations with morphine sulfate in the past   Per [facility]   Pristiq [desvenlafaxine] Other (See Comments)   Drowsiness and  hangover sensation Per MAR        Medication List     STOP taking these medications    losartan 50 MG tablet Commonly known as: COZAAR   QUEtiapine 25 MG tablet Commonly known as: SEROQUEL       TAKE these medications    acetaminophen  325 MG tablet Commonly known as: TYLENOL Take 325 mg by mouth every 6 (six) hours as needed for moderate pain or mild pain.   amiodarone 200 MG tablet Commonly known as: PACERONE TAKE ONE-HALF TABLET DAILY   bumetanide 1 MG tablet Commonly known as: BUMEX Take 1 mg by mouth daily.   divalproex 250 MG DR tablet Commonly known as: DEPAKOTE Take 250 mg by mouth 2 (two) times daily.   donepezil 10 MG tablet Commonly known as: ARICEPT Take 10 mg by mouth at bedtime.   DULoxetine 60 MG capsule Commonly known as: CYMBALTA Take 1 capsule (60 mg total) by mouth daily. For mood and chronic pain What changed:  when to take this additional instructions   Eliquis 5 MG Tabs tablet Generic drug: apixaban Take 1 tablet (5 mg total) by mouth 2 (two) times daily.   ezetimibe 10 MG tablet Commonly known as: ZETIA Take 1 tablet (10 mg total) by mouth daily. What changed: when to take this   meclizine 25 MG tablet Commonly known as: ANTIVERT Take 25 mg by mouth every 8 (eight) hours as needed for dizziness.   melatonin 5 MG Tabs Take 5 mg by mouth at bedtime.   methenamine 1 g tablet Commonly known as: HIPREX Take 1 g by mouth 2 (two) times daily. For prophylaxis   metoprolol tartrate 50 MG tablet Commonly known as: LOPRESSOR Take 50 mg by mouth 2 (two) times daily.   mirabegron ER 25 MG Tb24 tablet Commonly known as: MYRBETRIQ Take 25 mg by mouth daily.   multivitamin with minerals Tabs tablet Take 1 tablet by mouth daily.   nitrofurantoin 50 MG capsule Commonly known as: MACRODANTIN Take 50 mg by mouth daily. For prophylaxis   nitroGLYCERIN 0.4 MG SL tablet Commonly known as: NITROSTAT Place 0.4 mg under the tongue  every 5 (five) minutes x 3 doses as needed for chest pain.   pantoprazole 40 MG tablet Commonly known as: PROTONIX TAKE ONE TABLET TWICE DAILY What changed: when to take this   PreserVision AREDS Caps Take 1 capsule by mouth 2 (two) times daily.   tolterodine 2 MG tablet Commonly known as: DETROL Take 2 mg by mouth daily as needed (overactive bladder).   vitamin C 250 MG tablet Commonly known as: ASCORBIC ACID Take 250 mg by mouth daily.               Durable Medical Equipment  (From admission, onward)           Start     Ordered   Unscheduled  For home use only DME Walker rolling  Once       Comments: 5 wheel  Question Answer Comment  Walker: With [place] Wheels   Patient needs a walker to treat with the following condition Weakness      04/05/21 1025   Unscheduled  For home use only DME 3 n 1  Once        04/05/21 1025              Major procedures and Radiology Reports - PLEASE review detailed and final reports thoroughly  -       DG Chest 2 View  Result Date: 04/02/2021 CLINICAL DATA:  Altered mental status. EXAM: CHEST - 2 VIEW COMPARISON:  Chest radiograph dated 02/25/2021. FINDINGS: Mild eventration of the right hemidiaphragm. Minimal left lung base atelectasis/scarring. No focal consolidation, pleural effusion or pneumothorax. Stable mild cardiomegaly. Median sternotomy wires and CABG vascular clips.  Atherosclerotic calcification of the aorta. Degenerative changes of the spine. No acute osseous pathology. IMPRESSION: No active cardiopulmonary disease. Electronically Signed   By: Anner Crete M.D.   On: 04/02/2021 18:51   CT Head Wo Contrast  Result Date: 04/02/2021 CLINICAL DATA:  Altered mental status. EXAM: CT HEAD WITHOUT CONTRAST TECHNIQUE: Contiguous axial images were obtained from the base of the skull through the vertex without intravenous contrast. COMPARISON:  February 25, 2021 FINDINGS: Brain: There is mild cerebral atrophy with  widening of the extra-axial spaces and ventricular dilatation. There are areas of decreased attenuation within the white matter tracts of the supratentorial brain, consistent with microvascular disease changes. A small right-sided para thalamic lacunar infarct is noted. Vascular: No hyperdense vessel or unexpected calcification. Skull: Normal. Negative for fracture or focal lesion. Sinuses/Orbits: There is mild left ethmoid sinus mucosal thickening. Other: None. IMPRESSION: 1. Generalized cerebral atrophy. 2. No acute intracranial abnormality. Electronically Signed   By: Virgina Norfolk M.D.   On: 04/02/2021 19:12   MR BRAIN WO CONTRAST  Result Date: 04/02/2021 CLINICAL DATA:  Dizziness and confusion with generalized weakness. EXAM: MRI HEAD WITHOUT CONTRAST TECHNIQUE: Multiplanar, multiecho pulse sequences of the brain and surrounding structures were obtained without intravenous contrast. COMPARISON:  02/21/2017 FINDINGS: Brain: No acute infarct, mass effect or extra-axial collection. No acute or chronic hemorrhage. Confluent hyperintense T2-weighted white matter signal. Generalized volume loss without a clear lobar predilection. The midline structures are normal. Vascular: Major flow voids are preserved. Skull and upper cervical spine: Normal calvarium and skull base. Visualized upper cervical spine and soft tissues are normal. Sinuses/Orbits:No paranasal sinus fluid levels or advanced mucosal thickening. No mastoid or middle ear effusion. Normal orbits. IMPRESSION: 1. No acute intracranial abnormality. 2. Confluent hyperintense T2-weighted white matter signal, most consistent with chronic ischemic microangiopathy. 3. Generalized volume loss without a clear lobar predilection. ( Cerebral Atrophy (ICD10-G31.9).) Electronically Signed   By: Ulyses Jarred M.D.   On: 04/02/2021 22:02   DG Chest Port 1 View  Result Date: 04/03/2021 CLINICAL DATA:  Shortness of breath EXAM: PORTABLE CHEST 1 VIEW COMPARISON:   Chest radiograph 04/02/2021 FINDINGS: Median sternotomy wires and mediastinal surgical clips are stable. The cardiomediastinal silhouette is stable. There is asymmetric elevation of the right hemidiaphragm. There are persistent patchy opacities in the left lung base, not significantly changed. There is no new or worsening focal airspace disease. There is no significant pleural effusion. There is no pneumothorax. There is no acute osseous abnormality. IMPRESSION: Unchanged patchy opacities in the left lung base which may reflect atelectasis or infection. Electronically Signed   By: Valetta Mole M.D.   On: 04/03/2021 07:50     Today   Subjective    Mavi Un today has no headache,no chest abdominal pain,no new weakness tingling or numbness, feels much better .   Objective   Blood pressure 130/90, pulse 87, temperature 97.8 F (36.6 C), temperature source Oral, resp. rate 17, height 5\' 5"  (1.651 m), weight 75.7 kg, SpO2 96 %.   Intake/Output Summary (Last 24 hours) at 04/05/2021 1026 Last data filed at 04/05/2021 0837 Gross per 24 hour  Intake 481.67 ml  Output 650 ml  Net -168.33 ml    Exam  Awake Alert x 2, No new F.N deficits,   [place].AT,PERRAL Supple Neck,No JVD, No cervical lymphadenopathy appriciated.  Symmetrical Chest wall movement, Good air movement bilaterally, CTAB RRR,No Gallops,Rubs or new Murmurs, No Parasternal Heave +ve B.Sounds, Abd Soft, Non tender, No organomegaly appriciated, No  rebound -guarding or rigidity. No Cyanosis, Clubbing or edema, No new Rash or bruise   Data Review   CBC w Diff:  Lab Results  Component Value Date   WBC 13.9 (H) 04/05/2021   HGB 11.2 (L) 04/05/2021   HGB 12.9 03/09/2015   HCT 35.5 (L) 04/05/2021   HCT 38.4 03/09/2015   PLT 279 04/05/2021   PLT 239 03/09/2015   LYMPHOPCT 7 04/05/2021   MONOPCT 5 04/05/2021   EOSPCT 0 04/05/2021   BASOPCT 0 04/05/2021    CMP:  Lab Results  Component Value Date   NA 136 04/05/2021   NA 140  07/05/2015   K 4.5 04/05/2021   CL 102 04/05/2021   CO2 26 04/05/2021   BUN 25 (H) 04/05/2021   BUN 12 07/05/2015   CREATININE 0.68 04/05/2021   CREATININE 0.73 10/16/2016   PROT 5.9 (L) 04/05/2021   PROT 6.6 08/05/2014   ALBUMIN 2.4 (L) 04/05/2021   ALBUMIN 4.2 08/05/2014   BILITOT 0.5 04/05/2021   BILITOT 0.5 08/05/2014   ALKPHOS 60 04/05/2021   AST 23 04/05/2021   ALT 21 04/05/2021  .   Total Time in preparing paper work, data evaluation and todays exam - 13 minutes  Lala Lund M.D on 04/05/2021 at 10:26 AM  [facility]

## 2021-04-05 NOTE — TOC Transition Note (Signed)
Transition of Care [facility]) - CM/SW Discharge Note   Patient Details  Name: Marissa Bowen MRN: [id number] Date of Birth: 06-02-1926  [facility] CM/SW Contact:  Tresa Endo Phone Number: 04/05/2021, 12:30 PM   Clinical Narrative:    Patient will DC to: [name] Anticipated DC date: 04/05/2021 Family notified: Pt daughter Transport by: Corey Harold   Per MD patient ready for DC to [name]. RN to call report prior to discharge [phone]). RN, patient, patient's family, and facility notified of DC. Discharge Summary and FL2 sent to facility. DC packet on chart. Ambulance transport requested for patient.   CSW will sign off for now as social work intervention is no longer needed. Please consult [place] again if new needs arise.           Patient Goals and CMS Choice        Discharge Placement                       Discharge Plan and Services                                     Social Determinants of Health (SDOH) Interventions     Readmission Risk Interventions No flowsheet data found.

## 2021-04-05 NOTE — NC FL2 (Signed)
MEDICAID FL2 LEVEL OF CARE SCREENING TOOL     IDENTIFICATION  Patient Name: Marissa Bowen Birthdate: Oct 04, 1926 Sex: female Admission Date (Current Location): 04/02/2021  [facility] and [place] Number:  Herbalist and Address:  [facility] [place] [address] [place], [place] [address]      Provider Number: [id number]  Attending Physician Name and Address:  Thurnell Lose, MD  Relative Name and Phone Number:  Donzetta Matters (Daughter)   [phone]    Current Level of Care: Hospital Recommended Level of Care: [place] Prior Approval Number:    Date Approved/Denied:   PASRR Number:    Discharge Plan: Other (Comment) [name])    Current Diagnoses: Patient Active Problem List   Diagnosis Date Noted   Severe sepsis ([place]) 04/03/2021   COVID-19 virus infection 04/03/2021   Dehydration 04/03/2021   Acute prerenal azotemia 40/81/4481   Acute metabolic encephalopathy 85/63/1497   Hallucinations 03/11/2017   Essential tremor 03/11/2017   Prolonged Q-T interval on ECG 02/28/2017   Recurrent falls 02/27/2017   Epistaxis 02/27/2017   TIA (transient ischemic attack) 02/21/2017   GAD (generalized anxiety disorder) 11/19/2016   Diverticulitis 10/05/2016   Elevated LFTs 10/04/2016   SIRS (systemic inflammatory response syndrome) ([place]) 10/04/2016   Macular degeneration of both eyes 09/22/2015   Carotid stenosis 08/10/2015   On amiodarone therapy 08/10/2015   Chronic cholecystitis with calculus 01/03/2015   Allergic rhinitis 08/05/2014   Essential hypertension 05/03/2014   Osteoarthritis 09/29/2013   Routine general medical examination at a health care facility 09/29/2013   Depression with anxiety 08/03/2013   Atrial fibrillation ([place]) 09/02/2012   Anemia due to chronic blood loss 08/18/2012   Diarrhea 06/08/2012   Postural hypotension 06/08/2012   ACUTE POSTHEMORRHAGIC ANEMIA 10/23/2009   BACK PAIN,  LUMBAR 09/27/2009   MUSCLE WEAKNESS (GENERALIZED) 09/27/2009   IBS 12/07/2008   Weakness 07/21/2008   MIGRAINE WITH AURA 08/13/2007   OSTEOARTHRITIS 08/13/2007   TRANSIENT ISCHEMIC ATTACK 04/13/2007   Hyperlipidemia LDL goal <100 11/25/2006   CAD (coronary artery disease) 11/25/2006   GERD 11/25/2006   DIVERTICULITIS, HX OF 11/25/2006    Orientation RESPIRATION BLADDER Height & Weight     Place, Self  Normal Incontinent, External catheter Weight: 166 lb 14.2 oz (75.7 kg) Height:  5\' 5"  (165.1 cm)  BEHAVIORAL SYMPTOMS/MOOD NEUROLOGICAL BOWEL NUTRITION STATUS        Diet (See DC Summary)  AMBULATORY STATUS COMMUNICATION OF NEEDS Skin   Limited Assist Verbally Normal                       Personal Care Assistance Level of Assistance  Dressing, Bathing, Feeding Bathing Assistance: Limited assistance Feeding assistance: Independent Dressing Assistance: Limited assistance     Functional Limitations Info  Sight, Hearing, Speech Sight Info: Adequate Hearing Info: Adequate Speech Info: Adequate    SPECIAL CARE FACTORS FREQUENCY  PT (By licensed PT)     PT Frequency: 3x a week              Contractures Contractures Info: Not present    Additional Factors Info  Code Status, Allergies Code Status Info: DNR Allergies Info: Asa (Aspirin)   Crestor (Rosuvastatin Calcium)   Hydrocodone   Ibuprofen   Oxycodone Hcl   Penicillins   Prednisone   Statins   Morphine And Related   Pristiq (Desvenlafaxine)           Current  Medications (04/05/2021):  This is the current hospital active medication list Current Facility-Administered Medications  Medication Dose Route Frequency Provider Last Rate Last Admin   acetaminophen (TYLENOL) tablet 650 mg  650 mg Oral Q6H PRN Howerter, Justin B, DO       Or   acetaminophen (TYLENOL) suppository 650 mg  650 mg Rectal Q6H PRN Howerter, Justin B, DO       albuterol (PROVENTIL) (2.5 MG/3ML) 0.083% nebulizer solution 2.5 mg  2.5 mg  Inhalation Q4H PRN Howerter, Justin B, DO       amiodarone (PACERONE) tablet 100 mg  100 mg Oral Daily Howerter, Justin B, DO   100 mg at 04/05/21 0827   apixaban (ELIQUIS) tablet 5 mg  5 mg Oral BID Howerter, Justin B, DO   5 mg at 04/05/21 0827   dexamethasone (DECADRON) injection 10 mg  10 mg Intravenous Q24H Lala Lund K, MD   10 mg at 04/05/21 0828   diltiazem (CARDIZEM) injection 10 mg  10 mg Intravenous Q6H PRN Thurnell Lose, MD       divalproex (DEPAKOTE) DR tablet 250 mg  250 mg Oral BID Chotiner, Yevonne Aline, MD   250 mg at 04/05/21 0827   DULoxetine (CYMBALTA) DR capsule 60 mg  60 mg Oral QHS Chotiner, Yevonne Aline, MD   60 mg at 04/04/21 2203   ezetimibe (ZETIA) tablet 10 mg  10 mg Oral Daily Howerter, Justin B, DO   10 mg at 04/05/21 0827   metoprolol tartrate (LOPRESSOR) injection 5 mg  5 mg Intravenous Q4H PRN Howerter, Justin B, DO       metoprolol tartrate (LOPRESSOR) tablet 50 mg  50 mg Oral BID Thurnell Lose, MD   50 mg at 04/05/21 0827   pantoprazole (PROTONIX) EC tablet 40 mg  40 mg Oral Daily Thurnell Lose, MD   40 mg at 04/05/21 0827   QUEtiapine (SEROQUEL) tablet 25 mg  25 mg Oral QHS Howerter, Justin B, DO   25 mg at 04/04/21 2203     Discharge Medications: Please see discharge summary for a list of discharge medications.  Relevant Imaging Results:  Relevant Lab Results:   Additional Information SS#: [id number]  Reece Agar, LCSWA

## 2021-04-08 LAB — CULTURE, BLOOD (ROUTINE X 2)
Culture: NO GROWTH
Special Requests: ADEQUATE

## 2021-04-19 ENCOUNTER — Emergency Department (HOSPITAL_COMMUNITY)
Admission: EM | Admit: 2021-04-19 | Discharge: 2021-04-19 | Disposition: A | Discharge status: Skilled Nursing Facility | Payer: Medicare Other | Attending: Emergency Medicine | Admitting: Emergency Medicine

## 2021-04-19 ENCOUNTER — Encounter (HOSPITAL_COMMUNITY): Payer: Self-pay | Admitting: Emergency Medicine

## 2021-04-19 ENCOUNTER — Emergency Department (HOSPITAL_COMMUNITY): Payer: Medicare Other

## 2021-04-19 DIAGNOSIS — Z7901 Long term (current) use of anticoagulants: Secondary | ICD-10-CM | POA: Diagnosis not present

## 2021-04-19 DIAGNOSIS — I119 Hypertensive heart disease without heart failure: Secondary | ICD-10-CM | POA: Diagnosis not present

## 2021-04-19 DIAGNOSIS — I480 Paroxysmal atrial fibrillation: Secondary | ICD-10-CM | POA: Diagnosis not present

## 2021-04-19 DIAGNOSIS — S0990XA Unspecified injury of head, initial encounter: Secondary | ICD-10-CM | POA: Insufficient documentation

## 2021-04-19 DIAGNOSIS — Z951 Presence of aortocoronary bypass graft: Secondary | ICD-10-CM | POA: Insufficient documentation

## 2021-04-19 DIAGNOSIS — W19XXXA Unspecified fall, initial encounter: Secondary | ICD-10-CM | POA: Diagnosis not present

## 2021-04-19 DIAGNOSIS — I2510 Atherosclerotic heart disease of native coronary artery without angina pectoris: Secondary | ICD-10-CM | POA: Insufficient documentation

## 2021-04-19 DIAGNOSIS — Z8616 Personal history of COVID-19: Secondary | ICD-10-CM | POA: Diagnosis not present

## 2021-04-19 DIAGNOSIS — Z79899 Other long term (current) drug therapy: Secondary | ICD-10-CM | POA: Insufficient documentation

## 2021-04-19 LAB — CBC WITH DIFFERENTIAL/PLATELET
Abs Immature Granulocytes: 0.03 10*3/uL (ref 0.00–0.07)
Basophils Absolute: 0.1 10*3/uL (ref 0.0–0.1)
Basophils Relative: 1 %
Eosinophils Absolute: 0.4 10*3/uL (ref 0.0–0.5)
Eosinophils Relative: 4 %
HCT: 41.9 % (ref 36.0–46.0)
Hemoglobin: 13 g/dL (ref 12.0–15.0)
Immature Granulocytes: 0 %
Lymphocytes Relative: 18 %
Lymphs Abs: 1.5 10*3/uL (ref 0.7–4.0)
MCH: 29.7 pg (ref 26.0–34.0)
MCHC: 31 g/dL (ref 30.0–36.0)
MCV: 95.7 fL (ref 80.0–100.0)
Monocytes Absolute: 0.9 10*3/uL (ref 0.1–1.0)
Monocytes Relative: 11 %
Neutro Abs: 5.3 10*3/uL (ref 1.7–7.7)
Neutrophils Relative %: 66 %
Platelets: 243 10*3/uL (ref 150–400)
RBC: 4.38 MIL/uL (ref 3.87–5.11)
RDW: 14.1 % (ref 11.5–15.5)
WBC: 8.2 10*3/uL (ref 4.0–10.5)
nRBC: 0 % (ref 0.0–0.2)

## 2021-04-19 LAB — BASIC METABOLIC PANEL
Anion gap: 10 (ref 5–15)
BUN: 10 mg/dL (ref 8–23)
CO2: 31 mmol/L (ref 22–32)
Calcium: 8.7 mg/dL — ABNORMAL LOW (ref 8.9–10.3)
Chloride: 94 mmol/L — ABNORMAL LOW (ref 98–111)
Creatinine, Ser: 0.85 mg/dL (ref 0.44–1.00)
GFR, Estimated: >60 mL/min (ref >60)
Glucose, Bld: 141 mg/dL — ABNORMAL HIGH (ref 70–99)
Potassium: 3.8 mmol/L (ref 3.5–5.1)
Sodium: 135 mmol/L (ref 135–145)

## 2021-04-19 NOTE — ED Triage Notes (Signed)
Pt arrives via EMS from [place] after unwitnessed fall. Pt was sitting on bed and leaned forward, hitting her head on AC unit. Pt denies any pain. Denies LOC.

## 2021-04-19 NOTE — Progress Notes (Signed)
Orthopedic Tech Progress Note Patient Details:  Marissa Bowen October 10, 1926 [id number]  Patient ID: Marissa Bowen, female   DOB: [birth year], 87 y.o.   MRN: [id number]  Charline Bills Edu On 04/19/2021, 3:30 PM Responded to trauma level 2 call.

## 2021-04-19 NOTE — ED Notes (Signed)
Daughter called and updated. 

## 2021-04-19 NOTE — Progress Notes (Signed)
°   04/19/21 1510  Clinical Encounter Type  Visited With Patient not available  Visit Type Trauma  Referral From Nurse  Consult/Referral To Chaplain   Chaplain Jorene Guest responded while on the unit.  Spoke with Financial controller, Deloris. She will call if chaplain support is needed. This note was prepared by Jeanine Luz, M.Div..  For questions please contact by phone [phone].

## 2021-04-19 NOTE — ED Provider Notes (Signed)
[facility] EMERGENCY DEPARTMENT Provider Note   CSN: [id number] Arrival date & time: 04/19/21  1515     History Chief Complaint  Patient presents with   Marissa Bowen is a 87 y.o. female.  Presented to ER as a level 2 trauma.  From [place] after unwitnessed fall.  Patient is able to provide history, states that she recalls the entire event.  States that she was sitting on the bed leaning forward and lost her balance and hit her head.  Did not pass out, did not get lightheaded or have any difficulty breathing or nausea.  Currently she feels fine.  States that she has been able to stand since the fall.  Patient is on Eliquis chronically  HPI     Past Medical History:  Diagnosis Date   Anemia    Anisocoria 02/27/2017   Sees Dr Rutherford Guys   Anxiety    BACK PAIN, LUMBAR 09/27/2009   CAD (coronary artery disease)    a. s/p remote CABG 1997, 3V.;  b. LexiScan Myoview (8/15): No ischemia, EF 72%, normal study;  c. Echo 12/13 - EF 60-65%, no significant valvular abnormalities, normal RV size and systolic function.     CVA (cerebral infarction)    a. Thalamic infarct 09/2009. //  b. hx of TIA in 2008   Gastric ulcer    GERD 11/25/2006   GI bleed    a. 09/2009: secondary to antral ulcer.   Hiatal hernia    History of diverticulitis Dx 02/2012   History of MI (myocardial infarction)    HYPERLIPIDEMIA    HYPERTENSION    IBS 12/07/2008   MIGRAINE WITH AURA 08/13/2007   Mild carotid artery disease (HCC)    a. 0-39% bilat 05/2011 (f/u recommended 05/2013). //  b. Carotid US 3/17 - Bilat ICA 1-39% >> FU prn   Muscle weakness (generalized) 09/27/2009   OSTEOARTHRITIS 08/13/2007   PAF (paroxysmal atrial fibrillation) ([place]) 11/25/2006   a. Multaq >> changes to Amiodarone 2/2 $$  //  Eliquis (CHADS2-VASc = 6)   Urinary, incontinence, stress female    wears Pessary    Patient Active Problem List   Diagnosis Date Noted   Severe sepsis ([place]) 04/03/2021   COVID-19  virus infection 04/03/2021   Dehydration 04/03/2021   Acute prerenal azotemia 10/93/2355   Acute metabolic encephalopathy 73/22/0254   Hallucinations 03/11/2017   Essential tremor 03/11/2017   Prolonged Q-T interval on ECG 02/28/2017   Recurrent falls 02/27/2017   Epistaxis 02/27/2017   TIA (transient ischemic attack) 02/21/2017   GAD (generalized anxiety disorder) 11/19/2016   Diverticulitis 10/05/2016   Elevated LFTs 10/04/2016   SIRS (systemic inflammatory response syndrome) ([place]) 10/04/2016   Macular degeneration of both eyes 09/22/2015   Carotid stenosis 08/10/2015   On amiodarone therapy 08/10/2015   Chronic cholecystitis with calculus 01/03/2015   Allergic rhinitis 08/05/2014   Essential hypertension 05/03/2014   Osteoarthritis 09/29/2013   Routine general medical examination at a health care facility 09/29/2013   Depression with anxiety 08/03/2013   Atrial fibrillation ([place]) 09/02/2012   Anemia due to chronic blood loss 08/18/2012   Diarrhea 06/08/2012   Postural hypotension 06/08/2012   ACUTE POSTHEMORRHAGIC ANEMIA 10/23/2009   BACK PAIN, LUMBAR 09/27/2009   MUSCLE WEAKNESS (GENERALIZED) 09/27/2009   IBS 12/07/2008   Weakness 07/21/2008   MIGRAINE WITH AURA 08/13/2007   OSTEOARTHRITIS 08/13/2007   TRANSIENT ISCHEMIC ATTACK 04/13/2007   Hyperlipidemia LDL goal <100 11/25/2006  CAD (coronary artery disease) 11/25/2006   GERD 11/25/2006   DIVERTICULITIS, HX OF 11/25/2006    Past Surgical History:  Procedure Laterality Date   ABDOMINAL HYSTERECTOMY  1973   CATARACT EXTRACTION W/ INTRAOCULAR LENS  IMPLANT, BILATERAL Bilateral 1999   CHOLECYSTECTOMY N/A 01/03/2015   Procedure: LAPAROSCOPIC CHOLECYSTECTOMY WITH INTRAOPERATIVE CHOLANGIOGRAM;  Surgeon: Donnie Mesa, MD;  Location: [facility];  Service: General;  Laterality: N/A;   CORONARY ANGIOPLASTY  1997   Dr Lia Foyer   CORONARY ARTERY BYPASS GRAFT  1997   Dr Melvenia Needles   LAPAROSCOPIC CHOLECYSTECTOMY  01/03/2015    TONSILLECTOMY       OB History   No obstetric history on file.     Family History  Problem Relation Age of Onset   Heart attack Mother    Heart failure Father    Throat cancer Sister    Heart disease Sister    Coronary artery disease Sister    Diabetes Sister    Heart attack Sister    Colon cancer Neg Hx     Social History   Tobacco Use   Smoking status: Never   Smokeless tobacco: Never  Vaping Use   Vaping Use: Never used  Substance Use Topics   Alcohol use: No   Drug use: No    Home Medications Prior to Admission medications   Medication Sig Start Date End Date Taking? Authorizing Provider  acetaminophen (TYLENOL) 325 MG tablet Take 325 mg by mouth every 6 (six) hours as needed for moderate pain or mild pain.    [provider]  amiodarone (PACERONE) 200 MG tablet TAKE ONE-HALF TABLET DAILY Patient not taking: Reported on 04/03/2021 01/08/17   Gildardo Cranker, DO  bumetanide (BUMEX) 1 MG tablet Take 1 mg by mouth daily.    [provider]  divalproex (DEPAKOTE) 250 MG DR tablet Take 250 mg by mouth 2 (two) times daily.    [provider]  donepezil (ARICEPT) 10 MG tablet Take 10 mg by mouth at bedtime.    [provider]  DULoxetine (CYMBALTA) 60 MG capsule Take 1 capsule (60 mg total) by mouth daily. For mood and chronic pain Patient taking differently: Take 60 mg by mouth at bedtime. 01/29/17   Gildardo Cranker, DO  ELIQUIS 5 MG TABS tablet Take 1 tablet (5 mg total) by mouth 2 (two) times daily. 07/26/16   Josue Hector, MD  ezetimibe (ZETIA) 10 MG tablet Take 1 tablet (10 mg total) by mouth daily. Patient taking differently: Take 10 mg by mouth at bedtime. 02/26/17   Dessa Phi, DO  meclizine (ANTIVERT) 25 MG tablet Take 25 mg by mouth every 8 (eight) hours as needed for dizziness.    [provider]  melatonin 5 MG TABS Take 5 mg by mouth at bedtime.    [provider]  methenamine (HIPREX) 1 g tablet Take 1 g  by mouth 2 (two) times daily. For prophylaxis    [provider]  metoprolol tartrate (LOPRESSOR) 50 MG tablet Take 50 mg by mouth 2 (two) times daily.    [provider]  mirabegron ER (MYRBETRIQ) 25 MG TB24 tablet Take 25 mg by mouth daily.    [provider]  Multiple Vitamin (MULTIVITAMIN WITH MINERALS) TABS tablet Take 1 tablet by mouth daily.    [provider]  Multiple Vitamins-Minerals (PRESERVISION AREDS) CAPS Take 1 capsule by mouth 2 (two) times daily.    [provider]  nitrofurantoin (MACRODANTIN) 50 MG capsule Take  50 mg by mouth daily. For prophylaxis    [provider]  nitroGLYCERIN (NITROSTAT) 0.4 MG SL tablet Place 0.4 mg under the tongue every 5 (five) minutes x 3 doses as needed for chest pain.    [provider]  pantoprazole (PROTONIX) 40 MG tablet TAKE ONE TABLET TWICE DAILY Patient taking differently: Take 40 mg by mouth daily. 09/17/16   Gildardo Cranker, DO  tolterodine (DETROL) 2 MG tablet Take 2 mg by mouth daily as needed (overactive bladder).    [provider]  vitamin C (ASCORBIC ACID) 250 MG tablet Take 250 mg by mouth daily.    [provider]  sertraline (ZOLOFT) 25 MG tablet Take 1 tablet (25 mg total) by mouth daily. 02/14/11 10/19/11  Marletta Lor, MD    Allergies    Asa [aspirin], Crestor [rosuvastatin calcium], Hydrocodone, Ibuprofen, Oxycodone hcl, Penicillins, Prednisone, Statins, Morphine and related, and Pristiq [desvenlafaxine]  Review of Systems   Review of Systems  Constitutional:  Negative for chills and fever.  HENT:  Negative for ear pain and sore throat.   Eyes:  Negative for pain and visual disturbance.  Respiratory:  Negative for cough and shortness of breath.   Cardiovascular:  Negative for chest pain and palpitations.  Gastrointestinal:  Negative for abdominal pain and vomiting.  Genitourinary:  Negative for dysuria and hematuria.  Musculoskeletal:   Negative for arthralgias and back pain.  Skin:  Negative for color change and rash.  Neurological:  Negative for seizures and syncope.  All other systems reviewed and are negative.  Physical Exam Updated Vital Signs BP 115/65    Pulse 83    Temp 98.1 F (36.7 C) (Tympanic)    Resp (!) 21    Ht 5' 7.5" (1.715 m)    Wt 75.7 kg    SpO2 94%    BMI 25.75 kg/m   Physical Exam Vitals and nursing note reviewed.  Constitutional:      General: She is not in acute distress.    Appearance: She is well-developed.  HENT:     Head: Normocephalic and atraumatic.  Eyes:     Conjunctiva/sclera: Conjunctivae normal.  Cardiovascular:     Rate and Rhythm: Normal rate and regular rhythm.     Heart sounds: No murmur heard. Pulmonary:     Effort: Pulmonary effort is normal. No respiratory distress.     Breath sounds: Normal breath sounds.  Abdominal:     Palpations: Abdomen is soft.     Tenderness: There is no abdominal tenderness.  Musculoskeletal:        General: No swelling.     Cervical back: Neck supple.     Comments: Back: no C, T, L spine TTP, no step off or deformity RUE: no TTP throughout, no deformity, normal joint ROM, radial pulse intact, distal sensation and motor intact LUE: no TTP throughout, no deformity, normal joint ROM, radial pulse intact, distal sensation and motor intact RLE:  no TTP throughout, no deformity, normal joint ROM, distal pulse, sensation and motor intact LLE: no TTP throughout, no deformity, normal joint ROM, distal pulse, sensation and motor intact  Skin:    General: Skin is warm and dry.     Capillary Refill: Capillary refill takes less than 2 seconds.  Neurological:     General: No focal deficit present.     Mental Status: She is alert and oriented to person, place, and time.  Psychiatric:        Mood and Affect:  Mood normal.    ED Results / Procedures / Treatments   Labs (all labs ordered are listed, but only abnormal results are displayed) Labs  Reviewed  BASIC METABOLIC PANEL - Abnormal; Notable for the following components:      Result Value   Chloride 94 (*)    Glucose, Bld 141 (*)    Calcium 8.7 (*)    All other components within normal limits  CBC WITH DIFFERENTIAL/PLATELET    EKG EKG Interpretation  Date/Time:  Thursday April 19 2021 15:26:34 EST Ventricular Rate:  103 PR Interval:  59 QRS Duration: 85 QT Interval:  337 QTC Calculation: 399 R Axis:   36 Text Interpretation: Sinus tachycardia Atrial fibrillation Short PR interval Abnormal R-wave progression, early transition Nonspecific repol abnormality, diffuse leads Confirmed by Madalyn Rob [phone]) on 04/19/2021 5:33:58 PM  Radiology CT Head Wo Contrast  Result Date: 04/19/2021 CLINICAL DATA:  Unwitnessed fall EXAM: CT HEAD WITHOUT CONTRAST CT CERVICAL SPINE WITHOUT CONTRAST TECHNIQUE: Multidetector CT imaging of the head and cervical spine was performed following the standard protocol without intravenous contrast. Multiplanar CT image reconstructions of the cervical spine were also generated. COMPARISON:  02/25/2021 FINDINGS: CT HEAD FINDINGS Brain: No evidence of acute infarction, hemorrhage, hydrocephalus, extra-axial collection or mass lesion/mass effect. Extensive periventricular and deep white matter hypodensity. Vascular: No hyperdense vessel or unexpected calcification. Skull: Normal. Negative for fracture or focal lesion. Sinuses/Orbits: No acute finding. Other: None. CT CERVICAL SPINE FINDINGS Alignment: Normal. Skull base and vertebrae: No acute fracture. No primary bone lesion or focal pathologic process. Soft tissues and spinal canal: No prevertebral fluid or swelling. No visible canal hematoma. Disc levels: Moderate to severe multilevel disc space height loss and osteophytosis throughout. Upper chest: Negative. Other: None. IMPRESSION: 1. No acute intracranial pathology. Advanced small-vessel white matter disease. 2. No fracture or static subluxation  of the cervical spine. 3. Moderate to severe multilevel cervical disc degenerative disease. Electronically Signed   By: Delanna Ahmadi M.D.   On: 04/19/2021 15:55   CT Cervical Spine Wo Contrast  Result Date: 04/19/2021 CLINICAL DATA:  Unwitnessed fall EXAM: CT HEAD WITHOUT CONTRAST CT CERVICAL SPINE WITHOUT CONTRAST TECHNIQUE: Multidetector CT imaging of the head and cervical spine was performed following the standard protocol without intravenous contrast. Multiplanar CT image reconstructions of the cervical spine were also generated. COMPARISON:  02/25/2021 FINDINGS: CT HEAD FINDINGS Brain: No evidence of acute infarction, hemorrhage, hydrocephalus, extra-axial collection or mass lesion/mass effect. Extensive periventricular and deep white matter hypodensity. Vascular: No hyperdense vessel or unexpected calcification. Skull: Normal. Negative for fracture or focal lesion. Sinuses/Orbits: No acute finding. Other: None. CT CERVICAL SPINE FINDINGS Alignment: Normal. Skull base and vertebrae: No acute fracture. No primary bone lesion or focal pathologic process. Soft tissues and spinal canal: No prevertebral fluid or swelling. No visible canal hematoma. Disc levels: Moderate to severe multilevel disc space height loss and osteophytosis throughout. Upper chest: Negative. Other: None. IMPRESSION: 1. No acute intracranial pathology. Advanced small-vessel white matter disease. 2. No fracture or static subluxation of the cervical spine. 3. Moderate to severe multilevel cervical disc degenerative disease. Electronically Signed   By: Delanna Ahmadi M.D.   On: 04/19/2021 15:55   DG Pelvis Portable  Result Date: 04/19/2021 CLINICAL DATA:  Status post fall. EXAM: PORTABLE PELVIS 1-2 VIEWS COMPARISON:  None. FINDINGS: Subtle lucency involving the right greater trochanter likely projectional, but if there is tenderness over this region, recommend a dedicated two view x-ray of the right hip. No aggressive  osseous lesion.  Normal alignment. Generalized osteopenia. Mild osteoarthritis of the left hip. Soft tissue are unremarkable. No radiopaque foreign body or soft tissue emphysema. IMPRESSION: Subtle lucency involving the right greater trochanter likely projectional, but if there is tenderness over this region, recommend a dedicated two view x-ray of the right hip. Electronically Signed   By: Kathreen Devoid M.D.   On: 04/19/2021 17:21   DG Chest Portable 1 View  Result Date: 04/19/2021 CLINICAL DATA:  Trauma, fall EXAM: PORTABLE CHEST 1 VIEW COMPARISON:  04/03/2021 FINDINGS: Persistent elevation of right hemidiaphragm. Chronic interstitial changes. No new consolidation. Mild bibasilar atelectasis/scarring. Possible small left pleural effusion. No pneumothorax. Similar cardiomediastinal contours. IMPRESSION: Mild bibasilar atelectasis/scarring. Possible small left pleural effusion. Electronically Signed   By: Macy Mis M.D.   On: 04/19/2021 16:20    Procedures Procedures   Medications Ordered in ED Medications - No data to display  ED Course  I have reviewed the triage vital signs and the nursing notes.  Pertinent labs & imaging results that were available during my care of the patient were reviewed by me and considered in my medical decision making (see chart for details).    MDM Rules/Calculators/A&P                         87 year old lady with history of atrial fibrillation on Eliquis presenting to ER as a level 2 trauma.  On exam patient appears well in no acute distress.  She denies any trauma besides hitting her head.  No obvious trauma on careful examination of head or remainder of body.  Screening chest and pelvis x-rays were negative.  Radiologist questioned possible lucency on her right greater trochanter however patient does not have any focal tenderness over this region.  Basic labs stable, patient remained well-appearing with stable vital signs, will discharge back to facility.  After the  discussed management above, the patient was determined to be safe for discharge.  The patient was in agreement with this plan and all questions regarding their care were answered.  ED return precautions were discussed and the patient will return to the ED with any significant worsening of condition.     Final Clinical Impression(s) / ED Diagnoses Final diagnoses:  Fall, initial encounter  Chronic anticoagulation    Rx / DC Orders ED Discharge Orders     None        Lucrezia Starch, MD 04/19/21 1735

## 2021-04-19 NOTE — Discharge Instructions (Signed)
Follow-up with your primary care doctor.  Come back to ER as needed if you have any additional falls, lightheadedness, any episodes of passing out or other new concerning symptoms.

## 2021-04-19 NOTE — ED Notes (Signed)
Patient transported to CT with TRN.  

## 2021-04-21 ENCOUNTER — Emergency Department (HOSPITAL_COMMUNITY)
Admission: EM | Admit: 2021-04-21 | Discharge: 2021-04-22 | Disposition: A | Discharge status: Home / Self Care | Payer: Medicare Other | Attending: Emergency Medicine | Admitting: Emergency Medicine

## 2021-04-21 ENCOUNTER — Encounter (HOSPITAL_COMMUNITY): Payer: Self-pay

## 2021-04-21 ENCOUNTER — Other Ambulatory Visit: Payer: Self-pay

## 2021-04-21 ENCOUNTER — Emergency Department (HOSPITAL_COMMUNITY): Payer: Medicare Other

## 2021-04-21 DIAGNOSIS — I2510 Atherosclerotic heart disease of native coronary artery without angina pectoris: Secondary | ICD-10-CM | POA: Insufficient documentation

## 2021-04-21 DIAGNOSIS — I4891 Unspecified atrial fibrillation: Secondary | ICD-10-CM | POA: Diagnosis not present

## 2021-04-21 DIAGNOSIS — I509 Heart failure, unspecified: Secondary | ICD-10-CM | POA: Diagnosis not present

## 2021-04-21 DIAGNOSIS — Z79899 Other long term (current) drug therapy: Secondary | ICD-10-CM | POA: Insufficient documentation

## 2021-04-21 DIAGNOSIS — R4182 Altered mental status, unspecified: Secondary | ICD-10-CM | POA: Diagnosis not present

## 2021-04-21 DIAGNOSIS — I110 Hypertensive heart disease with heart failure: Secondary | ICD-10-CM | POA: Insufficient documentation

## 2021-04-21 DIAGNOSIS — Z955 Presence of coronary angioplasty implant and graft: Secondary | ICD-10-CM | POA: Insufficient documentation

## 2021-04-21 DIAGNOSIS — R45851 Suicidal ideations: Secondary | ICD-10-CM | POA: Diagnosis not present

## 2021-04-21 DIAGNOSIS — F03918 Unspecified dementia, unspecified severity, with other behavioral disturbance: Secondary | ICD-10-CM | POA: Insufficient documentation

## 2021-04-21 DIAGNOSIS — R456 Violent behavior: Secondary | ICD-10-CM | POA: Diagnosis not present

## 2021-04-21 DIAGNOSIS — Z7901 Long term (current) use of anticoagulants: Secondary | ICD-10-CM | POA: Insufficient documentation

## 2021-04-21 DIAGNOSIS — F332 Major depressive disorder, recurrent severe without psychotic features: Secondary | ICD-10-CM | POA: Diagnosis not present

## 2021-04-21 DIAGNOSIS — R4689 Other symptoms and signs involving appearance and behavior: Secondary | ICD-10-CM | POA: Diagnosis not present

## 2021-04-21 DIAGNOSIS — Z20822 Contact with and (suspected) exposure to covid-19: Secondary | ICD-10-CM | POA: Insufficient documentation

## 2021-04-21 DIAGNOSIS — Z8616 Personal history of COVID-19: Secondary | ICD-10-CM | POA: Diagnosis not present

## 2021-04-21 LAB — RESP PANEL BY RT-PCR (FLU A&B, COVID) ARPGX2
Influenza A by PCR: NEGATIVE
Influenza B by PCR: NEGATIVE
SARS Coronavirus 2 by RT PCR: NEGATIVE

## 2021-04-21 LAB — COMPREHENSIVE METABOLIC PANEL
ALT: 15 U/L (ref 0–44)
AST: 21 U/L (ref 15–41)
Albumin: 3.4 g/dL — ABNORMAL LOW (ref 3.5–5.0)
Alkaline Phosphatase: 69 U/L (ref 38–126)
Anion gap: 9 (ref 5–15)
BUN: 13 mg/dL (ref 8–23)
CO2: 32 mmol/L (ref 22–32)
Calcium: 9 mg/dL (ref 8.9–10.3)
Chloride: 95 mmol/L — ABNORMAL LOW (ref 98–111)
Creatinine, Ser: 0.71 mg/dL (ref 0.44–1.00)
GFR, Estimated: >60 mL/min (ref >60)
Glucose, Bld: 114 mg/dL — ABNORMAL HIGH (ref 70–99)
Potassium: 4 mmol/L (ref 3.5–5.1)
Sodium: 136 mmol/L (ref 135–145)
Total Bilirubin: 0.6 mg/dL (ref 0.3–1.2)
Total Protein: 6.9 g/dL (ref 6.5–8.1)

## 2021-04-21 LAB — CBC WITH DIFFERENTIAL/PLATELET
Abs Immature Granulocytes: 0.03 10*3/uL (ref 0.00–0.07)
Basophils Absolute: 0.1 10*3/uL (ref 0.0–0.1)
Basophils Relative: 1 %
Eosinophils Absolute: 0.4 10*3/uL (ref 0.0–0.5)
Eosinophils Relative: 5 %
HCT: 40.2 % (ref 36.0–46.0)
Hemoglobin: 12.7 g/dL (ref 12.0–15.0)
Immature Granulocytes: 0 %
Lymphocytes Relative: 14 %
Lymphs Abs: 1.2 10*3/uL (ref 0.7–4.0)
MCH: 29.9 pg (ref 26.0–34.0)
MCHC: 31.6 g/dL (ref 30.0–36.0)
MCV: 94.6 fL (ref 80.0–100.0)
Monocytes Absolute: 1 10*3/uL (ref 0.1–1.0)
Monocytes Relative: 12 %
Neutro Abs: 5.6 10*3/uL (ref 1.7–7.7)
Neutrophils Relative %: 68 %
Platelets: 224 10*3/uL (ref 150–400)
RBC: 4.25 MIL/uL (ref 3.87–5.11)
RDW: 14.5 % (ref 11.5–15.5)
WBC: 8.3 10*3/uL (ref 4.0–10.5)
nRBC: 0 % (ref 0.0–0.2)

## 2021-04-21 LAB — MAGNESIUM: Magnesium: 2.1 mg/dL (ref 1.7–2.4)

## 2021-04-21 MED ORDER — DULOXETINE HCL 30 MG PO CPEP
60.0000 mg | ORAL_CAPSULE | Freq: Every day | ORAL | Status: DC
  Administered 2021-04-21: 21:00:00 60 mg via ORAL
  Filled 2021-04-21: qty 2

## 2021-04-21 MED ORDER — MELATONIN 5 MG PO TABS
5.0000 mg | ORAL_TABLET | Freq: Every day | ORAL | Status: DC
  Administered 2021-04-21: 21:00:00 5 mg via ORAL
  Filled 2021-04-21: qty 1

## 2021-04-21 MED ORDER — PANTOPRAZOLE SODIUM 40 MG PO TBEC
40.0000 mg | DELAYED_RELEASE_TABLET | Freq: Every day | ORAL | Status: DC
  Administered 2021-04-21 – 2021-04-22 (×2): 40 mg via ORAL
  Filled 2021-04-21 (×2): qty 1

## 2021-04-21 MED ORDER — BUMETANIDE 1 MG PO TABS
1.0000 mg | ORAL_TABLET | Freq: Every day | ORAL | Status: DC
  Administered 2021-04-22: 14:00:00 1 mg via ORAL
  Filled 2021-04-21: qty 1

## 2021-04-21 MED ORDER — EZETIMIBE 10 MG PO TABS
10.0000 mg | ORAL_TABLET | Freq: Every day | ORAL | Status: DC
  Administered 2021-04-21: 22:00:00 10 mg via ORAL
  Filled 2021-04-21: qty 1

## 2021-04-21 MED ORDER — APIXABAN 5 MG PO TABS
5.0000 mg | ORAL_TABLET | Freq: Two times a day (BID) | ORAL | Status: DC
  Administered 2021-04-21 – 2021-04-22 (×2): 5 mg via ORAL
  Filled 2021-04-21 (×2): qty 1

## 2021-04-21 MED ORDER — NITROFURANTOIN MACROCRYSTAL 50 MG PO CAPS
50.0000 mg | ORAL_CAPSULE | Freq: Every day | ORAL | Status: DC
  Administered 2021-04-21 – 2021-04-22 (×2): 50 mg via ORAL
  Filled 2021-04-21 (×2): qty 1

## 2021-04-21 MED ORDER — ACETAMINOPHEN 325 MG PO TABS: 325.0000 mg | ORAL_TABLET | Freq: Four times a day (QID) | ORAL | Status: DC | PRN

## 2021-04-21 MED ORDER — DIVALPROEX SODIUM 250 MG PO DR TAB
250.0000 mg | DELAYED_RELEASE_TABLET | Freq: Two times a day (BID) | ORAL | Status: DC
  Administered 2021-04-22: 11:00:00 250 mg via ORAL
  Filled 2021-04-21: qty 1

## 2021-04-21 MED ORDER — METOPROLOL TARTRATE 25 MG PO TABS
50.0000 mg | ORAL_TABLET | Freq: Two times a day (BID) | ORAL | Status: DC
  Administered 2021-04-21 – 2021-04-22 (×2): 50 mg via ORAL
  Filled 2021-04-21 (×2): qty 2

## 2021-04-21 NOTE — ED Notes (Signed)
Provided pt with sandwich and water.

## 2021-04-21 NOTE — ED Provider Notes (Signed)
[facility] DEPT Provider Note   CSN: [id number] Arrival date & time: 04/21/21  1621     History Chief Complaint  Patient presents with   Aggressive Behavior    Marissa Bowen is a 87 y.o. female.  HPI Patient with history of dementia, PAF (on Eliquis), HTN, CHF, presenting for behavioral concerns.  Patient denies any current physical complaints.  She states that she is upset that she will not be with her family for Christmas.  History per daughter: Patient currently lives in [facility] assisted living.  She is ambulatory with the use of a walker.  She has obvious short-term memory loss but is able to engage in normal conversations on some days.  Last night, she showed a bottle of melatonin to the med tech at her place of residence.  She told the med tech that she took 1 and was considering taking the rest of the bottle in a suicide attempt.  Patient's daughter states that the patient has threatened suicide before.  Today, a sitter was sent to be with the patient to ensure she did not do any acts of self-harm.  Patient became combative and violent with the sitter as well as the med tech.  She threatened to hit the sitter with her cane.  She told her daughter that she would walk out of her home, into the cold, unless her daughter comes to get her.  Patient is on multiple psychotropic medications.  She recently had increased dosage of Depakote.  She is managed by psychiatric services at [facility].  She has been dealing with the stress of a recent diagnosis of terminal cancer in her daughter.     Past Medical History:  Diagnosis Date   Anemia    Anisocoria 02/27/2017   Sees Dr Rutherford Guys   Anxiety    BACK PAIN, LUMBAR 09/27/2009   CAD (coronary artery disease)    a. s/p remote CABG 1997, 3V.;  b. LexiScan Myoview (8/15): No ischemia, EF 72%, normal study;  c. Echo 12/13 - EF 60-65%, no significant valvular abnormalities, normal RV size and systolic function.      CVA (cerebral infarction)    a. Thalamic infarct 09/2009. //  b. hx of TIA in 2008   Gastric ulcer    GERD 11/25/2006   GI bleed    a. 09/2009: secondary to antral ulcer.   Hiatal hernia    History of diverticulitis Dx 02/2012   History of MI (myocardial infarction)    HYPERLIPIDEMIA    HYPERTENSION    IBS 12/07/2008   MIGRAINE WITH AURA 08/13/2007   Mild carotid artery disease (HCC)    a. 0-39% bilat 05/2011 (f/u recommended 05/2013). //  b. Carotid US 3/17 - Bilat ICA 1-39% >> FU prn   Muscle weakness (generalized) 09/27/2009   OSTEOARTHRITIS 08/13/2007   PAF (paroxysmal atrial fibrillation) ([place]) 11/25/2006   a. Multaq >> changes to Amiodarone 2/2 $$  //  Eliquis (CHADS2-VASc = 6)   Urinary, incontinence, stress female    wears Pessary    Patient Active Problem List   Diagnosis Date Noted   Severe sepsis ([place]) 04/03/2021   COVID-19 virus infection 04/03/2021   Dehydration 04/03/2021   Acute prerenal azotemia 40/97/3532   Acute metabolic encephalopathy 99/24/2683   Hallucinations 03/11/2017   Essential tremor 03/11/2017   Prolonged Q-T interval on ECG 02/28/2017   Recurrent falls 02/27/2017   Epistaxis 02/27/2017   TIA (transient ischemic attack) 02/21/2017   GAD (generalized anxiety  disorder) 11/19/2016   Diverticulitis 10/05/2016   Elevated LFTs 10/04/2016   SIRS (systemic inflammatory response syndrome) ([place]) 10/04/2016   Macular degeneration of both eyes 09/22/2015   Carotid stenosis 08/10/2015   On amiodarone therapy 08/10/2015   Chronic cholecystitis with calculus 01/03/2015   Allergic rhinitis 08/05/2014   Essential hypertension 05/03/2014   Osteoarthritis 09/29/2013   Routine general medical examination at a health care facility 09/29/2013   Depression with anxiety 08/03/2013   Atrial fibrillation ([place]) 09/02/2012   Anemia due to chronic blood loss 08/18/2012   Diarrhea 06/08/2012   Postural hypotension 06/08/2012   ACUTE POSTHEMORRHAGIC ANEMIA 10/23/2009    BACK PAIN, LUMBAR 09/27/2009   MUSCLE WEAKNESS (GENERALIZED) 09/27/2009   IBS 12/07/2008   Weakness 07/21/2008   MIGRAINE WITH AURA 08/13/2007   OSTEOARTHRITIS 08/13/2007   TRANSIENT ISCHEMIC ATTACK 04/13/2007   Hyperlipidemia LDL goal <100 11/25/2006   CAD (coronary artery disease) 11/25/2006   GERD 11/25/2006   DIVERTICULITIS, HX OF 11/25/2006    Past Surgical History:  Procedure Laterality Date   ABDOMINAL HYSTERECTOMY  1973   CATARACT EXTRACTION W/ INTRAOCULAR LENS  IMPLANT, BILATERAL Bilateral 1999   CHOLECYSTECTOMY N/A 01/03/2015   Procedure: LAPAROSCOPIC CHOLECYSTECTOMY WITH INTRAOPERATIVE CHOLANGIOGRAM;  Surgeon: Donnie Mesa, MD;  Location: [facility];  Service: General;  Laterality: N/A;   CORONARY ANGIOPLASTY  1997   Dr Lia Foyer   CORONARY ARTERY BYPASS GRAFT  1997   Dr Melvenia Needles   LAPAROSCOPIC CHOLECYSTECTOMY  01/03/2015   TONSILLECTOMY       OB History   No obstetric history on file.     Family History  Problem Relation Age of Onset   Heart attack Mother    Heart failure Father    Throat cancer Sister    Heart disease Sister    Coronary artery disease Sister    Diabetes Sister    Heart attack Sister    Colon cancer Neg Hx     Social History   Tobacco Use   Smoking status: Never   Smokeless tobacco: Never  Vaping Use   Vaping Use: Never used  Substance Use Topics   Alcohol use: No   Drug use: No    Home Medications Prior to Admission medications   Medication Sig Start Date End Date Taking? Authorizing Provider  acetaminophen (TYLENOL) 325 MG tablet Take 325 mg by mouth every 6 (six) hours as needed for moderate pain or mild pain.   Yes [provider]  bumetanide (BUMEX) 1 MG tablet Take 1 mg by mouth daily.   Yes [provider]  divalproex (DEPAKOTE) 125 MG DR tablet Take 125 mg by mouth daily.   Yes [provider]  divalproex (DEPAKOTE) 250 MG DR tablet Take 250 mg by mouth 2 (two) times daily.   Yes [provider]  donepezil (ARICEPT) 10 MG tablet Take 10 mg by mouth at bedtime.   Yes [provider]  DULoxetine (CYMBALTA) 60 MG capsule Take 1 capsule (60 mg total) by mouth daily. For mood and chronic pain Patient taking differently: Take 60 mg by mouth at bedtime. 01/29/17  Yes Carter, Monica, DO  ELIQUIS 5 MG TABS tablet Take 1 tablet (5 mg total) by mouth 2 (two) times daily. 07/26/16  Yes Josue Hector, MD  ezetimibe (ZETIA) 10 MG tablet Take 1 tablet (10 mg total) by mouth daily. Patient taking differently: Take 10 mg by mouth at bedtime. 02/26/17  Yes Dessa Phi, DO  meclizine (ANTIVERT) 25 MG tablet  Take 25 mg by mouth every 8 (eight) hours as needed for dizziness.   Yes [provider]  melatonin 5 MG TABS Take 5 mg by mouth at bedtime.   Yes [provider]  methenamine (HIPREX) 1 g tablet Take 1 g by mouth 2 (two) times daily.   Yes [provider]  metoprolol tartrate (LOPRESSOR) 50 MG tablet Take 50 mg by mouth 2 (two) times daily.   Yes [provider]  mirabegron ER (MYRBETRIQ) 25 MG TB24 tablet Take 25 mg by mouth daily.   Yes [provider]  Multiple Vitamin (MULTIVITAMIN WITH MINERALS) TABS tablet Take 1 tablet by mouth daily.   Yes [provider]  Multiple Vitamins-Minerals (PRESERVISION AREDS) CAPS Take 1 capsule by mouth 2 (two) times daily.   Yes [provider]  nitrofurantoin (MACRODANTIN) 50 MG capsule Take 50 mg by mouth daily. For prophylaxis   Yes [provider]  nitroGLYCERIN (NITROSTAT) 0.4 MG SL tablet Place 0.4 mg under the tongue every 5 (five) minutes x 3 doses as needed for chest pain.   Yes [provider]  pantoprazole (PROTONIX) 40 MG tablet TAKE ONE TABLET TWICE DAILY Patient taking differently: Take 40 mg by mouth daily. 09/17/16  Yes Gildardo Cranker, DO  tolterodine (DETROL) 2 MG tablet Take 2 mg by mouth daily as needed (overactive bladder).   Yes [provider]   vitamin C (ASCORBIC ACID) 250 MG tablet Take 250 mg by mouth daily.   Yes [provider]  amiodarone (PACERONE) 200 MG tablet TAKE ONE-HALF TABLET DAILY Patient not taking: Reported on 04/03/2021 01/08/17   Gildardo Cranker, DO  sertraline (ZOLOFT) 25 MG tablet Take 1 tablet (25 mg total) by mouth daily. 02/14/11 10/19/11  Marletta Lor, MD    Allergies    Asa [aspirin], Crestor [rosuvastatin calcium], Hydrocodone, Ibuprofen, Oxycodone hcl, Penicillins, Prednisone, Statins, Morphine and related, and Pristiq [desvenlafaxine]  Review of Systems   Review of Systems  Constitutional:  Negative for activity change, appetite change, chills, fatigue and fever.  HENT:  Negative for congestion, ear pain and sore throat.   Eyes:  Negative for pain and visual disturbance.  Respiratory:  Negative for cough, chest tightness, shortness of breath and wheezing.   Cardiovascular:  Negative for chest pain and palpitations.  Gastrointestinal:  Negative for abdominal pain, diarrhea, nausea and vomiting.  Genitourinary:  Negative for dysuria, flank pain, hematuria and pelvic pain.  Musculoskeletal:  Negative for arthralgias, back pain, myalgias and neck pain.  Skin:  Negative for color change and rash.  Neurological:  Negative for dizziness, seizures, syncope, weakness, light-headedness, numbness and headaches.  Hematological:  Bruises/bleeds easily (On Eliquis).  Psychiatric/Behavioral:  Positive for agitation, behavioral problems and suicidal ideas. Negative for hallucinations and self-injury.   All other systems reviewed and are negative.  Physical Exam Updated Vital Signs BP 119/82    Pulse 88    Temp 98.1 F (36.7 C) (Oral)    Resp 16    SpO2 96%   Physical Exam Vitals and nursing note reviewed.  Constitutional:      General: She is not in acute distress.    Appearance: Normal appearance. She is well-developed and normal weight. She is not ill-appearing, toxic-appearing or diaphoretic.   HENT:     Head: Normocephalic and atraumatic.     Right Ear: External ear normal.     Left Ear: External ear normal.     Nose: Nose normal.     Mouth/Throat:  Mouth: Mucous membranes are moist.     Pharynx: Oropharynx is clear.  Eyes:     General: No scleral icterus.    Extraocular Movements: Extraocular movements intact.     Conjunctiva/sclera: Conjunctivae normal.  Cardiovascular:     Rate and Rhythm: Normal rate. Rhythm irregular.     Heart sounds: No murmur heard. Pulmonary:     Effort: Pulmonary effort is normal. No respiratory distress.     Breath sounds: Normal breath sounds. No wheezing or rales.  Abdominal:     General: Abdomen is flat.     Palpations: Abdomen is soft.     Tenderness: There is no abdominal tenderness. There is no right CVA tenderness or left CVA tenderness.  Musculoskeletal:        General: No swelling, tenderness or deformity.     Cervical back: Normal range of motion and neck supple. No rigidity.     Right lower leg: No edema.     Left lower leg: No edema.  Skin:    General: Skin is warm and dry.     Capillary Refill: Capillary refill takes less than 2 seconds.     Coloration: Skin is not jaundiced or pale.  Neurological:     General: No focal deficit present.     Mental Status: She is alert and oriented to person, place, and time.     Cranial Nerves: No cranial nerve deficit.     Sensory: No sensory deficit.     Motor: No weakness.     Coordination: Coordination normal.  Psychiatric:        Attention and Perception: Perception normal. She does not perceive auditory or visual hallucinations.        Mood and Affect: Mood normal. Mood is not anxious. Affect is blunt. Affect is not tearful or inappropriate.        Speech: Speech normal. Speech is not rapid and pressured, delayed, slurred or tangential.        Behavior: Behavior normal. Behavior is not agitated, slowed, aggressive, withdrawn or combative. Behavior is cooperative.        Thought  Content: Thought content normal. Thought content does not include homicidal or suicidal ideation.        Judgment: Judgment normal.    ED Results / Procedures / Treatments   Labs (all labs ordered are listed, but only abnormal results are displayed) Labs Reviewed  COMPREHENSIVE METABOLIC PANEL - Abnormal; Notable for the following components:      Result Value   Chloride 95 (*)    Glucose, Bld 114 (*)    Albumin 3.4 (*)    All other components within normal limits  RESP PANEL BY RT-PCR (FLU A&B, COVID) ARPGX2  CBC WITH DIFFERENTIAL/PLATELET  MAGNESIUM  URINALYSIS, ROUTINE W REFLEX MICROSCOPIC    EKG EKG Interpretation  Date/Time:  Saturday April 21 2021 17:08:25 EST Ventricular Rate:  103 PR Interval:    QRS Duration: 74 QT Interval:  306 QTC Calculation: 401 R Axis:   36 Text Interpretation: Atrial fibrillation Ventricular premature complex Low voltage, precordial leads Nonspecific repol abnormality, diffuse leads Baseline wander in lead(s) V3 V4 Confirmed by Godfrey Pick [phone]) on 04/21/2021 6:23:38 PM  Radiology CT Head Wo Contrast  Result Date: 04/21/2021 CLINICAL DATA:  Mental status change EXAM: CT HEAD WITHOUT CONTRAST TECHNIQUE: Contiguous axial images were obtained from the base of the skull through the vertex without intravenous contrast. COMPARISON:  CT brain 04/19/2021, MRI 04/02/2021, CT brain 02/25/2021 FINDINGS: Brain: No  acute territorial infarction, hemorrhage or intracranial mass. Atrophy and extensive chronic small vessel ischemic changes. Stable ventricle size. Small chronic infarcts and white matter disease in the thalamus and basal ganglia. Stable ventricle size. Vascular: No hyperdense vessels. Vertebral and carotid vascular calcification Skull: Normal. Negative for fracture or focal lesion. Sinuses/Orbits: No acute finding. Other: None IMPRESSION: 1. No CT evidence for acute intracranial abnormality. 2. Atrophy and extensive chronic white matter disease.  Electronically Signed   By: Donavan Foil M.D.   On: 04/21/2021 17:33    Procedures Procedures   Medications Ordered in ED Medications  DULoxetine (CYMBALTA) DR capsule 60 mg (has no administration in time range)  divalproex (DEPAKOTE) DR tablet 250 mg (has no administration in time range)  nitrofurantoin (MACRODANTIN) capsule 50 mg (has no administration in time range)  melatonin tablet 5 mg (has no administration in time range)  apixaban (ELIQUIS) tablet 5 mg (has no administration in time range)  metoprolol tartrate (LOPRESSOR) tablet 50 mg (has no administration in time range)  bumetanide (BUMEX) tablet 1 mg ( Oral Canceled Entry 04/21/21 2200)  pantoprazole (PROTONIX) EC tablet 40 mg (40 mg Oral Given 04/21/21 1927)  ezetimibe (ZETIA) tablet 10 mg (has no administration in time range)  acetaminophen (TYLENOL) tablet 325 mg (has no administration in time range)    ED Course  I have reviewed the triage vital signs and the nursing notes.  Pertinent labs & imaging results that were available during my care of the patient were reviewed by me and considered in my medical decision making (see chart for details).    MDM Rules/Calculators/A&P                         Patient is a 87 year old female who presents for suicidal ideation.  History is provided by both the patient, and her daughter.  Patient currently denies any SI and denies/does not recall episodes of SI prior to arrival.  Patient's daughter states that she has a history of threatening suicide.  Last night, she threatened suicide to the med tech at the assisted living facility where she resides.  She had a sitter today and she became violent and agitated.  She then refused her medications. Per patient's daughter, violence and refusal of medications are behaviors that are new to the patient. Patient is calm and cooperative upon arrival.  She denies any current physical complaints.  Physical exam is unremarkable. Work-up was initiated  for medical clearance. Following medical clearance, TTS was consulted, due to her daughter's concerns. Home medications were ordered. On reassessment, patient is resting comfortably with normal vital signs. Patient to remain in ED to await psychiatric evaluation.  Final Clinical Impression(s) / ED Diagnoses Final diagnoses:  Aggressive behavior    Rx / DC Orders ED Discharge Orders     None        Godfrey Pick, MD 04/21/21 2055

## 2021-04-21 NOTE — ED Triage Notes (Signed)
EMS reports from Marissa Bowen on [place], called out for violent and uncooperative behavior towards staff and SI. Pt's daughter recently Dx with terminal illness, staff states marked change in behavior after she was informed.  BP 132/70 HR 90 RR 18 Sp02 94 RA

## 2021-04-21 NOTE — ED Notes (Signed)
TTS in progress 

## 2021-04-21 NOTE — BH Assessment (Addendum)
Comprehensive Clinical Assessment (CCA) Note  04/21/2021 Marissa Bowen [id number] Disposition: Patient was reviewed with NP Ene Ajibola.  She recommended observation of patient overnight.  Psychiatry to review on 12/25.  RN Jeneen Rinks was notified of recommendation via secure messaging.    Pt is hard of hearing.  She was not oriented to day but knew it was Christmas.  Pt is not responding to internal stimuli.  She does not show any delusional thought process.  Pt is clear and goal oriented in her answers to questions.  She reports that her appetite and sleep are well.  Pt has psychiatric meds managed at [facility].     Chief Complaint:  Chief Complaint  Patient presents with   Aggressive Behavior   Visit Diagnosis: MDD recurrent, moderate    CCA Screening, Triage and Referral (STR)  Patient Reported Information How did you hear about [place]? No data recorded What Is the Reason for Your Visit/Call Today? Pt was brought to [facility] by EMS from [place] on [address].  Pt said "I took the bottle of melatonin out of her drawer and gave it to the med tech."  She said that she did not threaten to take the whole bottle of melatonin.  Pt says "I would not do that it would kill you."  Pt is upset that a tech would say that she treatened to take the whole bottle and called her "a lying bitch."  Pt says that her main stressor is finding out that her daughter had terminal cancer.  Pt said that she felt bad about it but she was not angry "that would not do her any good."  When asked about suicide she said "Lord no, that would not do any of my children any good."  Pt denies any previous suicide attempts.  Pt denies any HI, no A/V hallucinations.  Pt reports that her sleep and appetite have been good.  Pt says she feels like she let her children down because she is in the hospital.  Pt was asked if she felt like she wanted to hurt anyone and she said "no I could not do that." Clinician got verbal permission to contact  daughter Donzetta Matters [phone] but did not get an answer.  Clinician attempted to contact [facility] on [address] but did not get an answer there either.  How Long Has This Been Causing You Problems? <Week  What Do You Feel Would Help You the Most Today? Treatment for Depression or other mood problem   Have You Recently Had Any Thoughts About Hurting Yourself? No data recorded Are You Planning to Commit Suicide/Harm Yourself At This time? Yes (Staff at [facility] report she threatened to overdose.  She denies this.)   Have you Recently Had Thoughts About [place]? No  Are You Planning to Harm Someone at This Time? No  Explanation: No data recorded  Have You Used Any Alcohol or Drugs in the Past 24 Hours? No  How Long Ago Did You Use Drugs or Alcohol? No data recorded What Did You Use and How Much? No data recorded  Do You Currently Have a Therapist/Psychiatrist? Yes  Name of Therapist/Psychiatrist: Psychiatric meds are managed by [facility].   Have You Been Recently Discharged From Any Office Practice or Programs? No  Explanation of Discharge From Practice/Program: No data recorded    CCA Screening Triage Referral Assessment Type of Contact: Tele-Assessment  Telemedicine Service Delivery:   Is this Initial or Reassessment? Initial Assessment  Date Telepsych consult ordered  in [facility]:  04/21/21  Time Telepsych consult ordered in [facility]:  1857  Location of Assessment: [facility] ED  Provider Location: [facility] Assessment Services   Collateral Involvement: No data recorded  Does Patient Have a [place]? No data recorded Name and Contact of Legal Guardian: No data recorded If Minor and Not Living with Parent(s), Who has Custody? No data recorded Is CPS involved or ever been involved? Never  Is APS involved or ever been involved? Never   Patient Determined To Be At Risk for Harm To Self or Others Based on Review of Patient Reported Information or  Presenting Complaint? No  Method: No data recorded Availability of Means: No data recorded Intent: No data recorded Notification Required: No data recorded Additional Information for Danger to Others Potential: No data recorded Additional Comments for Danger to Others Potential: No data recorded Are There Guns or Other Weapons in Your Home? No data recorded Types of Guns/Weapons: No data recorded Are These Weapons Safely Secured?                            No data recorded Who Could Verify You Are Able To Have These Secured: No data recorded Do You Have any Outstanding Charges, Pending Court Dates, Parole/Probation? No data recorded Contacted To Inform of Risk of Harm To Self or Others: No data recorded   Does Patient Present under Involuntary Commitment? No  IVC Papers Initial File Date: No data recorded  [place] of Residence: [place]   Patient Currently Receiving the Following Services: Medication Management (By facility.)   Determination of Need: Urgent (48 hours)   Options For Referral: Other: Comment (Observe overnight and be seen by psychiatry on 12/25.)     CCA Biopsychosocial Patient Reported Schizophrenia/Schizoaffective Diagnosis in Past: No   Strengths: Pt is able to express herself clearly.   Mental Health Symptoms Depression:   Irritability   Duration of Depressive symptoms:  Duration of Depressive Symptoms: Less than two weeks   Mania:   None   Anxiety:    Tension; Worrying   Psychosis:  No data recorded  Duration of Psychotic symptoms:    Trauma:   None   Obsessions:   None   Compulsions:   None   Inattention:   None   Hyperactivity/Impulsivity:   None   Oppositional/Defiant Behaviors:   None   Emotional Irregularity:   Mood lability   Other Mood/Personality Symptoms:  No data recorded   Mental Status Exam Appearance and self-care  Stature:   Average   Weight:   Average weight   Clothing:   Casual   Grooming:    Normal   Cosmetic use:   None   Posture/gait:  No data recorded  Motor activity:   Slowed   Sensorium  Attention:  No data recorded  Concentration:   Normal   Orientation:   Situation; Place; Person; Object   Recall/memory:   Defective in Short-term   Affect and Mood  Affect:   Anxious; Depressed   Mood:   Anxious; Depressed   Relating  Eye contact:   Normal   Facial expression:   Depressed   Attitude toward examiner:   Cooperative   Thought and Language  Speech flow:  Clear and Coherent   Thought content:   Appropriate to Mood and Circumstances   Preoccupation:  No data recorded  Hallucinations:   None   Organization:  No data recorded  Executive  Functions  Fund of Knowledge:   Average   Intelligence:   Average   Abstraction:   Normal   Judgement:   Fair   Art therapist:   Adequate   Insight:   Fair   Decision Making:   Impulsive   Social Functioning  Social Maturity:   Impulsive   Social Judgement:   Normal   Stress  Stressors:   Grief/losses; Illness (Daughter's illness)   Coping Ability:   Overwhelmed   Skill Deficits:   Activities of daily living; Self-care; Self-control   Supports:   Family     Religion:    Leisure/Recreation:    Exercise/Diet: Exercise/Diet Have You Gained or Lost A Significant Amount of Weight in the Past Six Months?: No Do You Have Any Trouble Sleeping?: Yes Explanation of Sleeping Difficulties: Had some trouble recently when she learned of daughter's diagnosis.   CCA Employment/Education Employment/Work Situation: Employment / Work Technical sales engineer: Retired Has Patient ever [place] in Passenger transport manager?: No  Education: Education Is Patient Currently Attending School?: No Last Grade Completed: 57 Did [place]?: No   CCA Family/Childhood History Family and Relationship History: Family history Marital status: Widowed Does patient have children?:  Yes How many children?: 3  Childhood History:  Childhood History By whom was/is the patient raised?: Both parents Did patient suffer any verbal/emotional/physical/sexual abuse as a child?: No Did patient suffer from severe childhood neglect?: No Has patient ever been sexually abused/assaulted/raped as an adolescent or adult?: No Was the patient ever a victim of a crime or a disaster?: No Witnessed domestic violence?: No  Child/Adolescent Assessment:     CCA Substance Use Alcohol/Drug Use: Alcohol / Drug Use Pain Medications: See PTA medication list from [place]. Prescriptions: See PTA medication list from [place]. Over the Counter: See PTA medication list from [place]. History of alcohol / drug use?: No history of alcohol / drug abuse                         ASAM's:  Six Dimensions of Multidimensional Assessment  Dimension 1:  Acute Intoxication and/or Withdrawal Potential:      Dimension 2:  Biomedical Conditions and Complications:      Dimension 3:  Emotional, Behavioral, or Cognitive Conditions and Complications:     Dimension 4:  Readiness to Change:     Dimension 5:  Relapse, Continued use, or Continued Problem Potential:     Dimension 6:  Recovery/Living Environment:     ASAM Severity Score:    ASAM Recommended Level of Treatment:     Substance use Disorder (SUD)    Recommendations for Services/Supports/Treatments:    Discharge Disposition:    DSM5 Diagnoses: Patient Active Problem List   Diagnosis Date Noted   Severe sepsis ([place]) 04/03/2021   COVID-19 virus infection 04/03/2021   Dehydration 04/03/2021   Acute prerenal azotemia 70/04/7492   Acute metabolic encephalopathy 49/67/5916   Hallucinations 03/11/2017   Essential tremor 03/11/2017   Prolonged Q-T interval on ECG 02/28/2017   Recurrent falls 02/27/2017   Epistaxis 02/27/2017   TIA (transient ischemic attack) 02/21/2017   GAD (generalized anxiety disorder) 11/19/2016    Diverticulitis 10/05/2016   Elevated LFTs 10/04/2016   SIRS (systemic inflammatory response syndrome) ([place]) 10/04/2016   Macular degeneration of both eyes 09/22/2015   Carotid stenosis 08/10/2015   On amiodarone therapy 08/10/2015   Chronic cholecystitis with calculus 01/03/2015   Allergic rhinitis 08/05/2014   Essential hypertension 05/03/2014  Osteoarthritis 09/29/2013   Routine general medical examination at a health care facility 09/29/2013   Depression with anxiety 08/03/2013   Atrial fibrillation ([place]) 09/02/2012   Anemia due to chronic blood loss 08/18/2012   Diarrhea 06/08/2012   Postural hypotension 06/08/2012   ACUTE POSTHEMORRHAGIC ANEMIA 10/23/2009   BACK PAIN, LUMBAR 09/27/2009   MUSCLE WEAKNESS (GENERALIZED) 09/27/2009   IBS 12/07/2008   Weakness 07/21/2008   MIGRAINE WITH AURA 08/13/2007   OSTEOARTHRITIS 08/13/2007   TRANSIENT ISCHEMIC ATTACK 04/13/2007   Hyperlipidemia LDL goal <100 11/25/2006   CAD (coronary artery disease) 11/25/2006   GERD 11/25/2006   DIVERTICULITIS, HX OF 11/25/2006     Referrals to Alternative Service(s): Referred to Alternative Service(s):   Place:   Date:   Time:    Referred to Alternative Service(s):   Place:   Date:   Time:    Referred to Alternative Service(s):   Place:   Date:   Time:    Referred to Alternative Service(s):   Place:   Date:   Time:     [place] Session

## 2021-04-22 DIAGNOSIS — F03918 Unspecified dementia, unspecified severity, with other behavioral disturbance: Secondary | ICD-10-CM | POA: Diagnosis present

## 2021-04-22 DIAGNOSIS — R4689 Other symptoms and signs involving appearance and behavior: Secondary | ICD-10-CM | POA: Diagnosis not present

## 2021-04-22 DIAGNOSIS — R456 Violent behavior: Secondary | ICD-10-CM | POA: Diagnosis not present

## 2021-04-22 LAB — URINALYSIS, ROUTINE W REFLEX MICROSCOPIC
Bilirubin Urine: NEGATIVE
Glucose, UA: NEGATIVE mg/dL
Hgb urine dipstick: NEGATIVE
Ketones, ur: 5 mg/dL — AB
Nitrite: NEGATIVE
Protein, ur: 30 mg/dL — AB
Specific Gravity, Urine: 1.02 (ref 1.005–1.030)
Trans Epithel, UA: 3
pH: 7 (ref 5.0–8.0)

## 2021-04-22 MED ORDER — DIVALPROEX SODIUM 125 MG PO DR TAB
125.0000 mg | DELAYED_RELEASE_TABLET | Freq: Every day | ORAL | Status: DC
  Administered 2021-04-22: 12:00:00 125 mg via ORAL
  Filled 2021-04-22: qty 1

## 2021-04-22 MED ORDER — METHENAMINE HIPPURATE 1 G PO TABS
1.0000 g | ORAL_TABLET | Freq: Two times a day (BID) | ORAL | Status: DC
Start: 2021-04-22 — End: 2021-04-22
  Administered 2021-04-22: 14:00:00 1 g via ORAL
  Filled 2021-04-22: qty 1

## 2021-04-22 MED ORDER — DONEPEZIL HCL 5 MG PO TABS: 10.0000 mg | ORAL_TABLET | Freq: Every day | ORAL | Status: DC

## 2021-04-22 MED ORDER — ADULT MULTIVITAMIN W/MINERALS CH
1.0000 | ORAL_TABLET | Freq: Every day | ORAL | Status: DC
  Administered 2021-04-22: 11:00:00 1 via ORAL
  Filled 2021-04-22: qty 1

## 2021-04-22 MED ORDER — VITAMIN C 250 MG PO TABS
250.0000 mg | ORAL_TABLET | Freq: Every day | ORAL | Status: DC
  Administered 2021-04-22: 11:00:00 250 mg via ORAL
  Filled 2021-04-22: qty 1

## 2021-04-22 MED ORDER — OXYBUTYNIN CHLORIDE ER 5 MG PO TB24: 5.0000 mg | ORAL_TABLET | Freq: Every day | ORAL | Status: DC

## 2021-04-22 MED ORDER — DIVALPROEX SODIUM 250 MG PO DR TAB
250.0000 mg | DELAYED_RELEASE_TABLET | Freq: Two times a day (BID) | ORAL | Status: DC
  Filled 2021-04-22: qty 1

## 2021-04-22 NOTE — Discharge Instructions (Signed)
Followup with your doctor as needed

## 2021-04-22 NOTE — Consult Note (Signed)
Telepsych Consultation   Reason for Consult:  psych consult Referring Physician:  Godfrey Pick, MD Location of Patient:  [id number] Location of Provider: [place] Department  Patient Identification: MIRIELLE BYRUM MRN:  [id number] Principal Diagnosis: Dementia with behavioral disturbance Diagnosis:  Principal Problem:   Dementia with behavioral disturbance   Total Time spent with patient: 20 minutes  Subjective:   RUDI KNIPPENBERG is a 87 y.o. female patient with a history of dementia who admitted with aggressive behavior from her assisted living facility.  Patient presents laying in bed, alert and oriented to person, place, and partial to time; disoriented to situation. "I'm okay, I'm not really up and excited about Christmas but I'm okay". Regarding reason for admission, "I was coming in anyway and there was a disagreement so I came on in. A disagreement over coming back to the hospital".   Patient denies any suicidal and homicidal ideations, auditory and visual hallucinations. Patient is calm and cooperative throughout the assessment. Denies any safety concerns at current facility.   Collateral: Lynelle Weiler (daughter) at bedside:  Reports mother had COVID earlier in the month and has deteriorated since. Denies any safety concerns with the exception of being told by facility if she threatened to leave the facility they said there was "nothing they could do". States she has talked to the neurologist at [facility] and they are having a treatment team meeting in the morning to discuss if patient's current needs and if she meets criteria for onsite dementia unit. States she is pleased with care received at the facility and denies any safety concerns with patient returning. Verbalized plans to discuss medication regimen with treatment team in the morning.   HPI:  DARCELLA SHIFFMAN is a 87 year old female patient with a history fo dementia who presented from [place] [facility] [place] for being  "violent and uncooperative" towards staff.   Past Psychiatric History: none noted  Risk to Self:  pt denies Risk to Others:  pt denies Prior Inpatient Therapy:  pt denies Prior Outpatient Therapy:  pt denies  Past Medical History:  Past Medical History:  Diagnosis Date   Anemia    Anisocoria 02/27/2017   Sees Dr Rutherford Guys   Anxiety    BACK PAIN, LUMBAR 09/27/2009   CAD (coronary artery disease)    a. s/p remote CABG 1997, 3V.;  b. LexiScan Myoview (8/15): No ischemia, EF 72%, normal study;  c. Echo 12/13 - EF 60-65%, no significant valvular abnormalities, normal RV size and systolic function.     CVA (cerebral infarction)    a. Thalamic infarct 09/2009. //  b. hx of TIA in 2008   Gastric ulcer    GERD 11/25/2006   GI bleed    a. 09/2009: secondary to antral ulcer.   Hiatal hernia    History of diverticulitis Dx 02/2012   History of MI (myocardial infarction)    HYPERLIPIDEMIA    HYPERTENSION    IBS 12/07/2008   MIGRAINE WITH AURA 08/13/2007   Mild carotid artery disease (HCC)    a. 0-39% bilat 05/2011 (f/u recommended 05/2013). //  b. Carotid US 3/17 - Bilat ICA 1-39% >> FU prn   Muscle weakness (generalized) 09/27/2009   OSTEOARTHRITIS 08/13/2007   PAF (paroxysmal atrial fibrillation) ([place]) 11/25/2006   a. Multaq >> changes to Amiodarone 2/2 $$  //  Eliquis (CHADS2-VASc = 6)   Urinary, incontinence, stress female    wears Pessary    Past Surgical History:  Procedure  Laterality Date   ABDOMINAL HYSTERECTOMY  1973   CATARACT EXTRACTION W/ INTRAOCULAR LENS  IMPLANT, BILATERAL Bilateral 1999   CHOLECYSTECTOMY N/A 01/03/2015   Procedure: LAPAROSCOPIC CHOLECYSTECTOMY WITH INTRAOPERATIVE CHOLANGIOGRAM;  Surgeon: Donnie Mesa, MD;  Location: [place];  Service: General;  Laterality: N/A;   CORONARY ANGIOPLASTY  1997   Dr Lia Foyer   CORONARY ARTERY BYPASS GRAFT  1997   Dr Melvenia Needles   LAPAROSCOPIC CHOLECYSTECTOMY  01/03/2015   TONSILLECTOMY     Family History:  Family History  Problem  Relation Age of Onset   Heart attack Mother    Heart failure Father    Throat cancer Sister    Heart disease Sister    Coronary artery disease Sister    Diabetes Sister    Heart attack Sister    Colon cancer Neg Hx    Family Psychiatric  History: not noted Social History:  Social History   Substance and Sexual Activity  Alcohol Use No     Social History   Substance and Sexual Activity  Drug Use No    Social History   Socioeconomic History   Marital status: Widowed    Spouse name: Not on file   Number of children: Not on file   Years of education: Not on file   Highest education level: Not on file  Occupational History   Not on file  Tobacco Use   Smoking status: Never   Smokeless tobacco: Never  Vaping Use   Vaping Use: Never used  Substance and Sexual Activity   Alcohol use: No   Drug use: No   Sexual activity: Never  Other Topics Concern   Not on file  Social History Narrative   Not on file   Social Determinants of Health   Financial Resource Strain: Not on file  Food Insecurity: Not on file  Transportation Needs: Not on file  Physical Activity: Not on file  Stress: Not on file  Social Connections: Not on file   Additional Social History:    Allergies:   Allergies  Allergen Reactions   Asa [Aspirin] Other (See Comments)    Bleeding ulcer Per MAR   Crestor [Rosuvastatin Calcium] Other (See Comments)    Myalgia Per MAR   Hydrocodone Other (See Comments)    Migraines Per MAR   Ibuprofen Other (See Comments)    Ulcers Per MAR   Oxycodone Hcl Other (See Comments)     Migraines Per MAR   Penicillins Swelling and Rash    Per MAR Has patient had a PCN reaction causing immediate rash, facial/tongue/throat swelling, SOB or lightheadedness with hypotension: Yes Has patient had a PCN reaction causing severe rash involving mucus membranes or skin necrosis: No Has patient had a PCN reaction that required hospitalization No Has patient had a PCN  reaction occurring within the last 10 years: No If all of the above answers are "NO", then may proceed with Cephalosporin use.    Prednisone Other (See Comments)    Ulcers Per MAR   Statins Other (See Comments)    Aches and pains Per MAR   Morphine And Related Other (See Comments)     Had hallucinations with morphine sulfate in the past   Per [facility]   Pristiq [Desvenlafaxine] Other (See Comments)    Drowsiness and hangover sensation Per [facility]     Labs:  Results for orders placed or performed during the hospital encounter of 04/21/21 (from the past 48 hour(s))  Urinalysis, Routine w reflex microscopic Urine,  Clean Catch     Status: Abnormal   Collection Time: 04/21/21  6:15 AM  Result Value Ref Range   Color, Urine YELLOW YELLOW   APPearance CLEAR CLEAR   Specific Gravity, Urine 1.020 1.005 - 1.030   pH 7.0 5.0 - 8.0   Glucose, UA NEGATIVE NEGATIVE mg/dL   Hgb urine dipstick NEGATIVE NEGATIVE   Bilirubin Urine NEGATIVE NEGATIVE   Ketones, ur 5 (A) NEGATIVE mg/dL   Protein, ur 30 (A) NEGATIVE mg/dL   Nitrite NEGATIVE NEGATIVE   Leukocytes,Ua SMALL (A) NEGATIVE   RBC / HPF 0-5 0 - 5 RBC/hpf   WBC, UA 21-50 0 - 5 WBC/hpf   Bacteria, UA RARE (A) NONE SEEN   Squamous Epithelial / LPF 0-5 0 - 5   Trans Epithel, UA 3    Mucus PRESENT     Comment: Performed at [facility], [place] [address], [place], [place] [address]  Resp Panel by RT-PCR (Flu A&B, Covid) Nasopharyngeal Swab     Status: None   Collection Time: 04/21/21  5:13 PM   Specimen: Nasopharyngeal Swab; Nasopharyngeal(NP) swabs in vial transport medium  Result Value Ref Range   SARS Coronavirus 2 by RT PCR NEGATIVE NEGATIVE    Comment: (NOTE) SARS-CoV-2 target nucleic acids are NOT DETECTED.  The SARS-CoV-2 RNA is generally detectable in upper respiratory specimens during the acute phase of infection. The lowest concentration of SARS-CoV-2 viral copies this assay can detect is 138 copies/mL. A negative  result does not preclude SARS-Cov-2 infection and should not be used as the sole basis for treatment or other patient management decisions. A negative result may occur with  improper specimen collection/handling, submission of specimen other than nasopharyngeal swab, presence of viral mutation(s) within the areas targeted by this assay, and inadequate number of viral copies(<138 copies/mL). A negative result must be combined with clinical observations, patient history, and epidemiological information. The expected result is Negative.  Fact Sheet for Patients:  EntrepreneurPulse.com.au  Fact Sheet for Healthcare Providers:  IncredibleEmployment.be  This test is no t yet approved or cleared by the [place] FDA and  has been authorized for detection and/or diagnosis of SARS-CoV-2 by FDA under an Emergency Use Authorization (EUA). This EUA will remain  in effect (meaning this test can be used) for the duration of the COVID-19 declaration under Section 564(b)(1) of the Act, 21 U.S.C.section 360bbb-3(b)(1), unless the authorization is terminated  or revoked sooner.       Influenza A by PCR NEGATIVE NEGATIVE   Influenza B by PCR NEGATIVE NEGATIVE    Comment: (NOTE) The Xpert Xpress SARS-CoV-2/FLU/RSV plus assay is intended as an aid in the diagnosis of influenza from Nasopharyngeal swab specimens and should not be used as a sole basis for treatment. Nasal washings and aspirates are unacceptable for Xpert Xpress SARS-CoV-2/FLU/RSV testing.  Fact Sheet for Patients: EntrepreneurPulse.com.au  Fact Sheet for Healthcare Providers: IncredibleEmployment.be  This test is not yet approved or cleared by the [place] FDA and has been authorized for detection and/or diagnosis of SARS-CoV-2 by FDA under an Emergency Use Authorization (EUA). This EUA will remain in effect (meaning this test can be used) for the  duration of the COVID-19 declaration under Section 564(b)(1) of the Act, 21 U.S.C. section 360bbb-3(b)(1), unless the authorization is terminated or revoked.  Performed at [facility], [place] [address], [place], [place] [address]   Comprehensive metabolic panel     Status: Abnormal   Collection Time: 04/21/21  5:14 PM  Result Value Ref Range   Sodium 136 135 - 145 mmol/L   Potassium 4.0 3.5 - 5.1 mmol/L   Chloride 95 (L) 98 - 111 mmol/L   CO2 32 22 - 32 mmol/L   Glucose, Bld 114 (H) 70 - 99 mg/dL    Comment: Glucose reference range applies only to samples taken after fasting for at least 8 hours.   BUN 13 8 - 23 mg/dL   Creatinine, Ser 0.71 0.44 - 1.00 mg/dL   Calcium 9.0 8.9 - 10.3 mg/dL   Total Protein 6.9 6.5 - 8.1 g/dL   Albumin 3.4 (L) 3.5 - 5.0 g/dL   AST 21 15 - 41 U/L   ALT 15 0 - 44 U/L   Alkaline Phosphatase 69 38 - 126 U/L   Total Bilirubin 0.6 0.3 - 1.2 mg/dL   GFR, Estimated >60 >60 mL/min    Comment: (NOTE) Calculated using the CKD-EPI Creatinine Equation (2021)    Anion gap 9 5 - 15    Comment: Performed at [facility], [place] [address], [place], [place] [address]  CBC with Diff     Status: None   Collection Time: 04/21/21  5:14 PM  Result Value Ref Range   WBC 8.3 4.0 - 10.5 K/uL   RBC 4.25 3.87 - 5.11 MIL/uL   Hemoglobin 12.7 12.0 - 15.0 g/dL   HCT 40.2 36.0 - 46.0 %   MCV 94.6 80.0 - 100.0 fL   MCH 29.9 26.0 - 34.0 pg   MCHC 31.6 30.0 - 36.0 g/dL   RDW 14.5 11.5 - 15.5 %   Platelets 224 150 - 400 K/uL   nRBC 0.0 0.0 - 0.2 %   Neutrophils Relative % 68 %   Neutro Abs 5.6 1.7 - 7.7 K/uL   Lymphocytes Relative 14 %   Lymphs Abs 1.2 0.7 - 4.0 K/uL   Monocytes Relative 12 %   Monocytes Absolute 1.0 0.1 - 1.0 K/uL   Eosinophils Relative 5 %   Eosinophils Absolute 0.4 0.0 - 0.5 K/uL   Basophils Relative 1 %   Basophils Absolute 0.1 0.0 - 0.1 K/uL   Immature Granulocytes 0 %   Abs Immature Granulocytes 0.03 0.00 -  0.07 K/uL    Comment: Performed at [facility], [place] [address], [place], [place] [address]  Magnesium     Status: None   Collection Time: 04/21/21  5:14 PM  Result Value Ref Range   Magnesium 2.1 1.7 - 2.4 mg/dL    Comment: Performed at [facility], [place] [address], [place], [place] [address]    Medications:  Current Facility-Administered Medications  Medication Dose Route Frequency Provider Last Rate Last Admin   acetaminophen (TYLENOL) tablet 325 mg  325 mg Oral Q6H PRN Godfrey Pick, MD       apixaban Arne Cleveland) tablet 5 mg  5 mg Oral BID Godfrey Pick, MD   5 mg at 04/22/21 1039   bumetanide (BUMEX) tablet 1 mg  1 mg Oral Daily Godfrey Pick, MD   1 mg at 04/22/21 1426   divalproex (DEPAKOTE) DR tablet 125 mg  125 mg Oral Daily Lacretia Leigh, MD   125 mg at 04/22/21 1208   divalproex (DEPAKOTE) DR tablet 250 mg  250 mg Oral Q12H Godfrey Pick, MD   250 mg at 04/22/21 1038   donepezil (ARICEPT) tablet 10 mg  10 mg Oral QHS Lacretia Leigh, MD       DULoxetine (  CYMBALTA) DR capsule 60 mg  60 mg Oral QHS Godfrey Pick, MD   60 mg at 04/21/21 2127   ezetimibe (ZETIA) tablet 10 mg  10 mg Oral QHS Godfrey Pick, MD   10 mg at 04/21/21 2154   melatonin tablet 5 mg  5 mg Oral QHS Godfrey Pick, MD   5 mg at 04/21/21 2127   methenamine (HIPREX) tablet 1 g  1 g Oral BID Lacretia Leigh, MD   1 g at 04/22/21 1427   metoprolol tartrate (LOPRESSOR) tablet 50 mg  50 mg Oral BID Godfrey Pick, MD   50 mg at 04/22/21 1039   multivitamin with minerals tablet 1 tablet  1 tablet Oral Daily Lacretia Leigh, MD   1 tablet at 04/22/21 1039   nitrofurantoin (MACRODANTIN) capsule 50 mg  50 mg Oral Daily Godfrey Pick, MD   50 mg at 04/22/21 1427   oxybutynin (DITROPAN-XL) 24 hr tablet 5 mg  5 mg Oral QHS Lacretia Leigh, MD       pantoprazole (PROTONIX) EC tablet 40 mg  40 mg Oral Daily Godfrey Pick, MD   40 mg at 04/22/21 1038   vitamin C (ASCORBIC ACID) tablet 250 mg  250 mg Oral Daily  Lacretia Leigh, MD   250 mg at 04/22/21 1039   Current Outpatient Medications  Medication Sig Dispense Refill   acetaminophen (TYLENOL) 325 MG tablet Take 325 mg by mouth every 6 (six) hours as needed for moderate pain or mild pain.     bumetanide (BUMEX) 1 MG tablet Take 1 mg by mouth daily.     divalproex (DEPAKOTE) 125 MG DR tablet Take 125 mg by mouth daily.     divalproex (DEPAKOTE) 250 MG DR tablet Take 250 mg by mouth 2 (two) times daily.     donepezil (ARICEPT) 10 MG tablet Take 10 mg by mouth at bedtime.     DULoxetine (CYMBALTA) 60 MG capsule Take 1 capsule (60 mg total) by mouth daily. For mood and chronic pain (Patient taking differently: Take 60 mg by mouth at bedtime.) 30 capsule 6   ELIQUIS 5 MG TABS tablet Take 1 tablet (5 mg total) by mouth 2 (two) times daily. 180 tablet 3   ezetimibe (ZETIA) 10 MG tablet Take 1 tablet (10 mg total) by mouth daily. (Patient taking differently: Take 10 mg by mouth at bedtime.) 30 tablet 0   meclizine (ANTIVERT) 25 MG tablet Take 25 mg by mouth every 8 (eight) hours as needed for dizziness.     melatonin 5 MG TABS Take 5 mg by mouth at bedtime.     methenamine (HIPREX) 1 g tablet Take 1 g by mouth 2 (two) times daily.     metoprolol tartrate (LOPRESSOR) 50 MG tablet Take 50 mg by mouth 2 (two) times daily.     mirabegron ER (MYRBETRIQ) 25 MG TB24 tablet Take 25 mg by mouth daily.     Multiple Vitamin (MULTIVITAMIN WITH MINERALS) TABS tablet Take 1 tablet by mouth daily.     Multiple Vitamins-Minerals (PRESERVISION AREDS) CAPS Take 1 capsule by mouth 2 (two) times daily.     nitrofurantoin (MACRODANTIN) 50 MG capsule Take 50 mg by mouth daily. For prophylaxis     nitroGLYCERIN (NITROSTAT) 0.4 MG SL tablet Place 0.4 mg under the tongue every 5 (five) minutes x 3 doses as needed for chest pain.     pantoprazole (PROTONIX) 40 MG tablet TAKE ONE TABLET TWICE DAILY (Patient taking differently: Take 40 mg by mouth  daily.) 180 tablet 2   tolterodine  (DETROL) 2 MG tablet Take 2 mg by mouth daily as needed (overactive bladder).     vitamin C (ASCORBIC ACID) 250 MG tablet Take 250 mg by mouth daily.     amiodarone (PACERONE) 200 MG tablet TAKE ONE-HALF TABLET DAILY (Patient not taking: Reported on 04/03/2021) 15 tablet 5   Musculoskeletal: Strength & Muscle Tone: within normal limits Gait & Station: normal Patient leans: N/A  Psychiatric Specialty Exam:  Presentation  General Appearance: Casual  Eye Contact:Fair  Speech:Clear and Coherent  Speech Volume:Normal  Handedness:No data recorded  Mood and Affect  Mood:Euthymic  Affect:Congruent  Thought Process  Thought Processes:Coherent  Descriptions of Associations:Intact  Orientation:Partial  Thought Content:Logical  History of Schizophrenia/Schizoaffective disorder:No  Duration of Psychotic Symptoms:No data recorded Hallucinations:Hallucinations: None  Ideas of Reference:None  Suicidal Thoughts:Suicidal Thoughts: No  Homicidal Thoughts:Homicidal Thoughts: No   Sensorium  Memory:Immediate Poor; Recent Fair; Remote Fair  Judgment:Fair  Insight:Lacking  Executive Functions  Concentration:Fair  Attention Span:Fair  Recall:Poor  Fund of Knowledge:Good  Language:Fair  Psychomotor Activity  Psychomotor Activity:Psychomotor Activity: Normal  Assets  Assets:Communication Skills; Financial Resources/Insurance; Housing; Resilience; Social Support; Vocational/Educational  Sleep  Sleep:Sleep: Good  Physical Exam: Physical Exam ROS Blood pressure 130/71, pulse 92, temperature 98 F (36.7 C), temperature source Oral, resp. rate 16, SpO2 93 %. There is no height or weight on file to calculate BMI.  Treatment Plan Summary: Plan Discharge patient back to assisted living facility where she is being followed by facility neurology and psychiatry services for further management.  Disposition: No evidence of imminent risk to self or others at present.    Patient does not meet criteria for psychiatric inpatient admission. Supportive therapy provided about ongoing stressors. Discussed crisis plan, support from social network, calling 911, coming to the Emergency Department, and calling Suicide Hotline.  This service was provided via telemedicine using a 2-way, interactive audio and video technology.  Names of all persons participating in this telemedicine service and their role in this encounter. Name: Oneida Alar Role: PMHNP  Name: Ernie Hew Role: Attending MD  Name: [name] Role: patient  Name: Doristine Counter Role: daughter    Inda Merlin, NP 04/22/2021 2:38 PM

## 2021-04-22 NOTE — ED Notes (Signed)
Patient to room 29. Patient alert and cooperative, no s/s of distress.

## 2021-04-22 NOTE — ED Notes (Signed)
Tried to call facility with dispo information. No one answered . Only allows to leave message. Gave patient daughter DC information/ AVS and daughter taking patient to facility.

## 2021-04-22 NOTE — ED Notes (Signed)
Patient DC d off unit to facility per provider. Patient alert, cooperative, no s/s of distress. Patient off unit in w/c, escorted by NT and daughter.  Patient transported by daughter.

## 2021-04-22 NOTE — ED Notes (Signed)
Found out of bed upset that she is not getting a room upstairs.  Explained to her that when we get a bed I will get her upstairs.  However, she is to be re-evaluated by Psych tomorrow 12/25.  Assisted back to bed, covered with blankets and some water.

## 2021-08-16 ENCOUNTER — Encounter (HOSPITAL_COMMUNITY): Payer: Self-pay

## 2021-08-16 ENCOUNTER — Emergency Department (HOSPITAL_COMMUNITY)
Admission: EM | Admit: 2021-08-16 | Discharge: 2021-08-16 | Disposition: A | Discharge status: Skilled Nursing Facility | Payer: Medicare Other | Attending: Emergency Medicine | Admitting: Emergency Medicine

## 2021-08-16 DIAGNOSIS — T730XXA Starvation, initial encounter: Secondary | ICD-10-CM | POA: Insufficient documentation

## 2021-08-16 DIAGNOSIS — I499 Cardiac arrhythmia, unspecified: Secondary | ICD-10-CM | POA: Insufficient documentation

## 2021-08-16 DIAGNOSIS — R531 Weakness: Secondary | ICD-10-CM | POA: Diagnosis present

## 2021-08-16 LAB — CBC WITH DIFFERENTIAL/PLATELET
Abs Immature Granulocytes: 0.04 10*3/uL (ref 0.00–0.07)
Basophils Absolute: 0.1 10*3/uL (ref 0.0–0.1)
Basophils Relative: 1 %
Eosinophils Absolute: 0.2 10*3/uL (ref 0.0–0.5)
Eosinophils Relative: 2 %
HCT: 40.1 % (ref 36.0–46.0)
Hemoglobin: 12.4 g/dL (ref 12.0–15.0)
Immature Granulocytes: 1 %
Lymphocytes Relative: 18 %
Lymphs Abs: 1.5 10*3/uL (ref 0.7–4.0)
MCH: 29.5 pg (ref 26.0–34.0)
MCHC: 30.9 g/dL (ref 30.0–36.0)
MCV: 95.5 fL (ref 80.0–100.0)
Monocytes Absolute: 1.2 10*3/uL — ABNORMAL HIGH (ref 0.1–1.0)
Monocytes Relative: 14 %
Neutro Abs: 5.5 10*3/uL (ref 1.7–7.7)
Neutrophils Relative %: 64 %
Platelets: 149 10*3/uL — ABNORMAL LOW (ref 150–400)
RBC: 4.2 MIL/uL (ref 3.87–5.11)
RDW: 15.6 % — ABNORMAL HIGH (ref 11.5–15.5)
WBC: 8.5 10*3/uL (ref 4.0–10.5)
nRBC: 0 % (ref 0.0–0.2)

## 2021-08-16 LAB — BASIC METABOLIC PANEL
Anion gap: 7 (ref 5–15)
BUN: 16 mg/dL (ref 8–23)
CO2: 30 mmol/L (ref 22–32)
Calcium: 8.9 mg/dL (ref 8.9–10.3)
Chloride: 102 mmol/L (ref 98–111)
Creatinine, Ser: 0.72 mg/dL (ref 0.44–1.00)
GFR, Estimated: >60 mL/min (ref >60)
Glucose, Bld: 120 mg/dL — ABNORMAL HIGH (ref 70–99)
Potassium: 3.9 mmol/L (ref 3.5–5.1)
Sodium: 139 mmol/L (ref 135–145)

## 2021-08-16 MED ORDER — LACTATED RINGERS IV BOLUS
1000.0000 mL | Freq: Once | INTRAVENOUS | Status: AC
  Administered 2021-08-16: 1000 mL via INTRAVENOUS

## 2021-08-16 NOTE — ED Notes (Signed)
Attempted to call [facility] x 3 to give report without no answer ? ?

## 2021-08-16 NOTE — ED Notes (Signed)
Pt was finally given crackers and cheese with cranberry juice. ?

## 2021-08-16 NOTE — ED Triage Notes (Signed)
Pt comes from [place] via [facility] for generalized weakness and feeling hungry. Pt has dementia   ?

## 2021-08-16 NOTE — ED Provider Notes (Signed)
?[facility] DEPT ?Provider Note ? ? ?CSN: [id number] ?Arrival date & time: 08/16/21  0215 ? ?  ? ?History ? ?Chief Complaint  ?Patient presents with  ? Weakness  ? ? ?Marissa Bowen is a 88 y.o. female. ? ?88 year old female who presents the ER for being hungry.  Apparently at the facility EMS was asking whether there and she said that she was hungry and the staff there corroborated that statement.  EMS asked them if it was bad enough that she thought she needed to come the hospital and they said yes and they brought her here.  She has dementia but she has no other complaints at this time.  She has no abdominal pain, headache, weakness or other associated symptoms.  Attempted multiple times between me and nursing to contact the facility to get more information and there is no to be gained.  A lot answer the phone. ? ? ?Weakness ? ?  ? ?Home Medications ?Prior to Admission medications   ?Medication Sig Start Date End Date Taking? Authorizing Provider  ?acetaminophen (TYLENOL) 325 MG tablet Take 325 mg by mouth every 6 (six) hours as needed for moderate pain or mild pain.    [provider]  ?amiodarone (PACERONE) 200 MG tablet TAKE ONE-HALF TABLET DAILY ?Patient not taking: Reported on 04/03/2021 01/08/17   Gildardo Cranker, DO  ?bumetanide (BUMEX) 1 MG tablet Take 1 mg by mouth daily.    [provider]  ?divalproex (DEPAKOTE) 125 MG DR tablet Take 125 mg by mouth daily.    [provider]  ?divalproex (DEPAKOTE) 250 MG DR tablet Take 250 mg by mouth 2 (two) times daily.    [provider]  ?donepezil (ARICEPT) 10 MG tablet Take 10 mg by mouth at bedtime.    [provider]  ?DULoxetine (CYMBALTA) 60 MG capsule Take 1 capsule (60 mg total) by mouth daily. For mood and chronic pain ?Patient taking differently: Take 60 mg by mouth at bedtime. 01/29/17   Gildardo Cranker, DO  ?ELIQUIS 5 MG TABS tablet Take 1 tablet (5 mg total) by mouth 2 (two) times  daily. 07/26/16   Josue Hector, MD  ?ezetimibe (ZETIA) 10 MG tablet Take 1 tablet (10 mg total) by mouth daily. ?Patient taking differently: Take 10 mg by mouth at bedtime. 02/26/17   Dessa Phi, DO  ?meclizine (ANTIVERT) 25 MG tablet Take 25 mg by mouth every 8 (eight) hours as needed for dizziness.    [provider]  ?melatonin 5 MG TABS Take 5 mg by mouth at bedtime.    [provider]  ?methenamine (HIPREX) 1 g tablet Take 1 g by mouth 2 (two) times daily.    [provider]  ?metoprolol tartrate (LOPRESSOR) 50 MG tablet Take 50 mg by mouth 2 (two) times daily.    [provider]  ?mirabegron ER (MYRBETRIQ) 25 MG TB24 tablet Take 25 mg by mouth daily.    [provider]  ?Multiple Vitamin (MULTIVITAMIN WITH MINERALS) TABS tablet Take 1 tablet by mouth daily.    [provider]  ?Multiple Vitamins-Minerals (PRESERVISION AREDS) CAPS Take 1 capsule by mouth 2 (two) times daily.    [provider]  ?nitrofurantoin (MACRODANTIN) 50 MG capsule Take 50 mg by mouth daily. For prophylaxis    [provider]  ?nitroGLYCERIN (NITROSTAT) 0.4 MG SL tablet Place 0.4 mg under the tongue every 5 (five) minutes x 3 doses as needed for chest pain.  [provider]  ?pantoprazole (PROTONIX) 40 MG tablet TAKE ONE TABLET TWICE DAILY ?Patient taking differently: Take 40 mg by mouth daily. 09/17/16   Gildardo Cranker, DO  ?tolterodine (DETROL) 2 MG tablet Take 2 mg by mouth daily as needed (overactive bladder).    [provider]  ?vitamin C (ASCORBIC ACID) 250 MG tablet Take 250 mg by mouth daily.    [provider]  ?sertraline (ZOLOFT) 25 MG tablet Take 1 tablet (25 mg total) by mouth daily. 02/14/11 10/19/11  Marletta Lor, MD  ?   ? ?Allergies    ?Asa [aspirin], Crestor [rosuvastatin calcium], Hydrocodone, Ibuprofen, Oxycodone hcl, Penicillins, Prednisone, Statins, Morphine and related, and Pristiq [desvenlafaxine]    ? ?Review of Systems   ?Review of Systems  ?Neurological:  Positive for weakness.  ? ?Physical Exam ?Updated Vital Signs ?BP (!) 154/110   Pulse (!) 105   Temp 98.3 ?F (36.8 ?C) (Oral)   Resp (!) 26   SpO2 96%  ?Physical Exam ?Vitals and nursing note reviewed.  ?Constitutional:   ?   Appearance: She is well-developed.  ?HENT:  ?   Head: Normocephalic and atraumatic.  ?   Nose: No congestion or rhinorrhea.  ?   Mouth/Throat:  ?   Mouth: Mucous membranes are moist.  ?   Pharynx: Oropharynx is clear.  ?Eyes:  ?   Pupils: Pupils are equal, round, and reactive to light.  ?Cardiovascular:  ?   Rate and Rhythm: Tachycardia present. Rhythm irregular.  ?Pulmonary:  ?   Effort: No respiratory distress.  ?   Breath sounds: No stridor.  ?Abdominal:  ?   General: Abdomen is flat. There is no distension.  ?Musculoskeletal:     ?   General: Normal range of motion.  ?   Cervical back: Normal range of motion.  ?Skin: ?   General: Skin is warm and dry.  ?Neurological:  ?   General: No focal deficit present.  ?   Mental Status: She is alert.  ? ? ?ED Results / Procedures / Treatments   ?Labs ?(all labs ordered are listed, but only abnormal results are displayed) ?Labs Reviewed  ?CBC WITH DIFFERENTIAL/PLATELET - Abnormal; Notable for the following components:  ?    Result Value  ? RDW 15.6 (*)   ? Platelets 149 (*)   ? Monocytes Absolute 1.2 (*)   ? All other components within normal limits  ?BASIC METABOLIC PANEL - Abnormal; Notable for the following components:  ? Glucose, Bld 120 (*)   ? All other components within normal limits  ? ? ?EKG ?None ? ?Radiology ?No results found. ? ?Procedures ?Procedures  ? ? ?Medications Ordered in ED ?Medications  ?lactated ringers bolus 1,000 mL (1,000 mLs Intravenous New Bag/Given 08/16/21 0254)  ? ? ?ED Course/ Medical Decision Making/ A&P ?  ?                        ?Medical Decision Making ?Amount and/or Complexity of Data Reviewed ?Labs: ordered. ?ECG/medicine tests: ordered. ? ? ?No  complaint. Not able to get other info. Did basic blood work/ecg which was reassuring. Gave her food. No complaints at this time.  ? ? ?Final Clinical Impression(s) / ED Diagnoses ?Final diagnoses:  ?Hungry, initial encounter  ? ? ?Rx / DC Orders ?ED Discharge Orders   ? ? None  ? ?  ? ? ?  ?Merrily Pew, MD ?08/16/21 [phone] ? ?

## 2021-08-16 NOTE — ED Notes (Signed)
[facility] called for transport.  

## 2021-08-16 NOTE — ED Notes (Signed)
Pt states "I was hungry, I don't know why they didn't just give me  a cracker" ?

## 2021-08-22 ENCOUNTER — Emergency Department (HOSPITAL_COMMUNITY): Payer: Medicare Other

## 2021-08-22 ENCOUNTER — Encounter (HOSPITAL_COMMUNITY): Payer: Self-pay | Admitting: Emergency Medicine

## 2021-08-22 ENCOUNTER — Emergency Department (HOSPITAL_COMMUNITY)
Admission: EM | Admit: 2021-08-22 | Discharge: 2021-08-22 | Disposition: A | Discharge status: Home / Self Care | Payer: Medicare Other | Source: Home / Self Care | Attending: Emergency Medicine | Admitting: Emergency Medicine

## 2021-08-22 DIAGNOSIS — I480 Paroxysmal atrial fibrillation: Secondary | ICD-10-CM | POA: Insufficient documentation

## 2021-08-22 DIAGNOSIS — R404 Transient alteration of awareness: Secondary | ICD-10-CM | POA: Diagnosis not present

## 2021-08-22 DIAGNOSIS — F03918 Unspecified dementia, unspecified severity, with other behavioral disturbance: Secondary | ICD-10-CM | POA: Insufficient documentation

## 2021-08-22 DIAGNOSIS — Z7901 Long term (current) use of anticoagulants: Secondary | ICD-10-CM | POA: Insufficient documentation

## 2021-08-22 DIAGNOSIS — W19XXXA Unspecified fall, initial encounter: Secondary | ICD-10-CM | POA: Insufficient documentation

## 2021-08-22 DIAGNOSIS — I110 Hypertensive heart disease with heart failure: Secondary | ICD-10-CM | POA: Diagnosis not present

## 2021-08-22 LAB — URINALYSIS, ROUTINE W REFLEX MICROSCOPIC
Bacteria, UA: NONE SEEN
Bilirubin Urine: NEGATIVE
Glucose, UA: NEGATIVE mg/dL
Hgb urine dipstick: NEGATIVE
Ketones, ur: 5 mg/dL — AB
Leukocytes,Ua: NEGATIVE
Nitrite: NEGATIVE
Protein, ur: 100 mg/dL — AB
Specific Gravity, Urine: 1.024 (ref 1.005–1.030)
pH: 6 (ref 5.0–8.0)

## 2021-08-22 LAB — COMPREHENSIVE METABOLIC PANEL
ALT: 13 U/L (ref 0–44)
AST: 21 U/L (ref 15–41)
Albumin: 3.3 g/dL — ABNORMAL LOW (ref 3.5–5.0)
Alkaline Phosphatase: 65 U/L (ref 38–126)
Anion gap: 10 (ref 5–15)
BUN: 15 mg/dL (ref 8–23)
CO2: 29 mmol/L (ref 22–32)
Calcium: 9.1 mg/dL (ref 8.9–10.3)
Chloride: 99 mmol/L (ref 98–111)
Creatinine, Ser: 0.73 mg/dL (ref 0.44–1.00)
GFR, Estimated: >60 mL/min (ref >60)
Glucose, Bld: 127 mg/dL — ABNORMAL HIGH (ref 70–99)
Potassium: 3.8 mmol/L (ref 3.5–5.1)
Sodium: 138 mmol/L (ref 135–145)
Total Bilirubin: 0.8 mg/dL (ref 0.3–1.2)
Total Protein: 6.8 g/dL (ref 6.5–8.1)

## 2021-08-22 LAB — CBC WITH DIFFERENTIAL/PLATELET
Abs Immature Granulocytes: 0.04 10*3/uL (ref 0.00–0.07)
Basophils Absolute: 0 10*3/uL (ref 0.0–0.1)
Basophils Relative: 0 %
Eosinophils Absolute: 0.1 10*3/uL (ref 0.0–0.5)
Eosinophils Relative: 1 %
HCT: 41.4 % (ref 36.0–46.0)
Hemoglobin: 13.2 g/dL (ref 12.0–15.0)
Immature Granulocytes: 0 %
Lymphocytes Relative: 16 %
Lymphs Abs: 1.5 10*3/uL (ref 0.7–4.0)
MCH: 29.7 pg (ref 26.0–34.0)
MCHC: 31.9 g/dL (ref 30.0–36.0)
MCV: 93 fL (ref 80.0–100.0)
Monocytes Absolute: 1.4 10*3/uL — ABNORMAL HIGH (ref 0.1–1.0)
Monocytes Relative: 14 %
Neutro Abs: 6.7 10*3/uL (ref 1.7–7.7)
Neutrophils Relative %: 69 %
Platelets: 201 10*3/uL (ref 150–400)
RBC: 4.45 MIL/uL (ref 3.87–5.11)
RDW: 15.9 % — ABNORMAL HIGH (ref 11.5–15.5)
WBC: 9.9 10*3/uL (ref 4.0–10.5)
nRBC: 0 % (ref 0.0–0.2)

## 2021-08-22 LAB — TROPONIN I (HIGH SENSITIVITY): Troponin I (High Sensitivity): 16 ng/L (ref <18)

## 2021-08-22 LAB — CK: Total CK: 60 U/L (ref 38–234)

## 2021-08-22 NOTE — ED Provider Notes (Signed)
?[facility] ?Provider Note ? ? ?CSN: [id number] ?Arrival date & time: 08/22/21  0036 ? ?  ? ?History ? ?Chief Complaint  ?Patient presents with  ? Fall  ? Atrial Fibrillation  ? ? ?Marissa Bowen is a 88 y.o. female with a past medical history of A-fib anticoagulated with Eliquis, dementia, anemia, who presents today for evaluation of an unwitnessed fall. ?Level 5 caveat dementia ? ?History is primarily obtained from chart review, triage/EMS. ?Patient was a unwitnessed fall.  She was found on the floor of her room tonight at 2315 per EMS.  She had reportedly been seen by staff at about 10 PM. ? ?Patient states she does not have any pain. ?She does not recall falling. ? ?Patient is at baseline mental status according to EMS. ? ? ?HPI ? ?  ? ?Home Medications ?Prior to Admission medications   ?Medication Sig Start Date End Date Taking? Authorizing Provider  ?acetaminophen (TYLENOL) 325 MG tablet Take 325 mg by mouth every 6 (six) hours as needed for moderate pain or mild pain.    [provider]  ?amiodarone (PACERONE) 200 MG tablet TAKE ONE-HALF TABLET DAILY ?Patient not taking: Reported on 04/03/2021 01/08/17   Gildardo Cranker, DO  ?bumetanide (BUMEX) 1 MG tablet Take 1 mg by mouth daily.    [provider]  ?divalproex (DEPAKOTE) 125 MG DR tablet Take 125 mg by mouth daily.    [provider]  ?divalproex (DEPAKOTE) 250 MG DR tablet Take 250 mg by mouth 2 (two) times daily.    [provider]  ?donepezil (ARICEPT) 10 MG tablet Take 10 mg by mouth at bedtime.    [provider]  ?DULoxetine (CYMBALTA) 60 MG capsule Take 1 capsule (60 mg total) by mouth daily. For mood and chronic pain ?Patient taking differently: Take 60 mg by mouth at bedtime. 01/29/17   Gildardo Cranker, DO  ?ELIQUIS 5 MG TABS tablet Take 1 tablet (5 mg total) by mouth 2 (two) times daily. 07/26/16   Josue Hector, MD  ?ezetimibe (ZETIA) 10 MG tablet Take 1 tablet (10 mg  total) by mouth daily. ?Patient taking differently: Take 10 mg by mouth at bedtime. 02/26/17   Dessa Phi, DO  ?meclizine (ANTIVERT) 25 MG tablet Take 25 mg by mouth every 8 (eight) hours as needed for dizziness.    [provider]  ?melatonin 5 MG TABS Take 5 mg by mouth at bedtime.    [provider]  ?methenamine (HIPREX) 1 g tablet Take 1 g by mouth 2 (two) times daily.    [provider]  ?metoprolol tartrate (LOPRESSOR) 50 MG tablet Take 50 mg by mouth 2 (two) times daily.    [provider]  ?mirabegron ER (MYRBETRIQ) 25 MG TB24 tablet Take 25 mg by mouth daily.    [provider]  ?Multiple Vitamin (MULTIVITAMIN WITH MINERALS) TABS tablet Take 1 tablet by mouth daily.    [provider]  ?Multiple Vitamins-Minerals (PRESERVISION AREDS) CAPS Take 1 capsule by mouth 2 (two) times daily.    [provider]  ?nitrofurantoin (MACRODANTIN) 50 MG capsule Take 50 mg by mouth daily. For prophylaxis    [provider]  ?nitroGLYCERIN (NITROSTAT) 0.4 MG SL tablet Place 0.4 mg under the tongue every 5 (five) minutes x 3 doses as needed for chest pain.    [provider]  ?pantoprazole (PROTONIX) 40 MG tablet TAKE ONE TABLET TWICE DAILY ?Patient taking differently: Take 40 mg  by mouth daily. 09/17/16   Gildardo Cranker, DO  ?tolterodine (DETROL) 2 MG tablet Take 2 mg by mouth daily as needed (overactive bladder).    [provider]  ?vitamin C (ASCORBIC ACID) 250 MG tablet Take 250 mg by mouth daily.    [provider]  ?sertraline (ZOLOFT) 25 MG tablet Take 1 tablet (25 mg total) by mouth daily. 02/14/11 10/19/11  Marletta Lor, MD  ?   ? ?Allergies    ?Asa [aspirin], Crestor [rosuvastatin calcium], Hydrocodone, Ibuprofen, Oxycodone hcl, Penicillins, Prednisone, Statins, Morphine and related, and Pristiq [desvenlafaxine]   ? ?Review of Systems   ?Review of Systems ? ?Physical Exam ?Updated Vital Signs ?BP (!) 146/99    Pulse 98   Temp 98.1 ?F (36.7 ?C) (Oral)   Resp (!) 28   SpO2 93%  ?Physical Exam ?Vitals and nursing note reviewed.  ?Constitutional:   ?   General: She is not in acute distress. ?   Appearance: She is not ill-appearing or diaphoretic.  ?HENT:  ?   Head: Normocephalic and atraumatic.  ?   Comments: No obvious traumatic injury ?   Mouth/Throat:  ?   Mouth: Mucous membranes are moist.  ?Eyes:  ?   General: No scleral icterus.    ?   Right eye: No discharge.     ?   Left eye: No discharge.  ?   Conjunctiva/sclera: Conjunctivae normal.  ?Cardiovascular:  ?   Rate and Rhythm: Normal rate. Rhythm irregular.  ?Pulmonary:  ?   Effort: Pulmonary effort is normal. No respiratory distress.  ?   Breath sounds: Normal breath sounds. No stridor.  ?Abdominal:  ?   General: There is no distension.  ?   Tenderness: There is no abdominal tenderness. There is no guarding.  ?Musculoskeletal:     ?   General: No deformity.  ?   Cervical back: Normal range of motion and neck supple.  ?   Right lower leg: No edema.  ?   Left lower leg: No edema.  ?   Comments: Patient does not have obvious deformities of bilateral arms and legs.  Compartments are soft and compressible. ?She allows me to range both of her lower extremities and upper extremities without obvious pain.  ?Skin: ?   General: Skin is warm and dry.  ?Neurological:  ?   Mental Status: She is alert. Mental status is at baseline.  ?   Motor: No abnormal muscle tone.  ?   Comments: Patient is awake, she is oriented to person.  ?Psychiatric:     ?   Mood and Affect: Mood normal.     ?   Behavior: Behavior normal.  ? ? ?ED Results / Procedures / Treatments   ?Labs ?(all labs ordered are listed, but only abnormal results are displayed) ?Labs Reviewed  ?COMPREHENSIVE METABOLIC PANEL - Abnormal; Notable for the following components:  ?    Result Value  ? Glucose, Bld 127 (*)   ? Albumin 3.3 (*)   ? All other components within normal limits  ?CBC WITH DIFFERENTIAL/PLATELET -  Abnormal; Notable for the following components:  ? RDW 15.9 (*)   ? Monocytes Absolute 1.4 (*)   ? All other components within normal limits  ?URINALYSIS, ROUTINE W REFLEX MICROSCOPIC - Abnormal; Notable for the following components:  ? Ketones, ur 5 (*)   ? Protein, ur 100 (*)   ? All other components within normal limits  ?CK  ?TROPONIN I (HIGH SENSITIVITY)  ?  TROPONIN I (HIGH SENSITIVITY)  ? ? ?EKG ?EKG Interpretation ? ?Date/Time:  Wednesday August 22 2021 00:42:45 EDT ?Ventricular Rate:  133 ?PR Interval:    ?QRS Duration: 71 ?QT Interval:  292 ?QTC Calculation: 435 ?R Axis:   59 ?Text Interpretation: Atrial fibrillation Repolarization abnormality, prob rate related Confirmed by Ripley Fraise [phone]) on 08/22/2021 12:55:14 AM ? ?Radiology ?CT HEAD WO CONTRAST (5MM) ? ?Result Date: 08/22/2021 ?CLINICAL DATA:  Fall, atrial fibrillation EXAM: CT HEAD WITHOUT CONTRAST CT CERVICAL SPINE WITHOUT CONTRAST TECHNIQUE: Multidetector CT imaging of the head and cervical spine was performed following the standard protocol without intravenous contrast. Multiplanar CT image reconstructions of the cervical spine were also generated. RADIATION DOSE REDUCTION: This exam was performed according to the departmental dose-optimization program which includes automated exposure control, adjustment of the mA and/or kV according to patient size and/or use of iterative reconstruction technique. COMPARISON:  CT head dated 04/21/2021. CT head/cervical spine dated 04/19/2021. FINDINGS: CT HEAD FINDINGS Brain: No evidence of acute infarction, hemorrhage, hydrocephalus, extra-axial collection or mass lesion/mass effect. Global cortical atrophy. Extensive subcortical white matter and periventricular small vessel ischemic changes. Vascular: Intracranial atherosclerosis. Skull: Normal. Negative for fracture or focal lesion. Sinuses/Orbits: Minimal partial opacification of the left ethmoid sinus. Visualized paranasal sinuses and mastoid air cells  are otherwise clear. Other: None. CT CERVICAL SPINE FINDINGS Alignment: Normal cervical lordosis. Skull base and vertebrae: No acute fracture. No primary bone lesion or focal pathologic process. Soft tissues a

## 2021-08-22 NOTE — ED Triage Notes (Signed)
Pt BIB EMS from [facility].  Pt has hx of dementia and afib.  Was found on floor of her room tonight about 2315. No obvious injury. Pt is on eliquis.  EMS found pt with a HR of 160s but pt appears to have self-converted en route before they administered any meds.  ?20G LAC ?

## 2021-08-22 NOTE — ED Notes (Signed)
Report called to med tech on at [facility]. ?

## 2021-08-22 NOTE — ED Notes (Signed)
Patient redrawn due to lab requesting redraw at this time. ?

## 2021-08-23 ENCOUNTER — Emergency Department (HOSPITAL_COMMUNITY): Payer: Medicare Other

## 2021-08-23 ENCOUNTER — Inpatient Hospital Stay (HOSPITAL_COMMUNITY)
Admission: EM | Admit: 2021-08-23 | Discharge: 2021-08-29 | DRG: 291 | Disposition: A | Discharge status: Hospice | Payer: Medicare Other | Source: Skilled Nursing Facility | Attending: Internal Medicine | Admitting: Internal Medicine

## 2021-08-23 ENCOUNTER — Other Ambulatory Visit: Payer: Self-pay

## 2021-08-23 DIAGNOSIS — I5033 Acute on chronic diastolic (congestive) heart failure: Secondary | ICD-10-CM | POA: Diagnosis present

## 2021-08-23 DIAGNOSIS — F03918 Unspecified dementia, unspecified severity, with other behavioral disturbance: Secondary | ICD-10-CM | POA: Diagnosis present

## 2021-08-23 DIAGNOSIS — I2510 Atherosclerotic heart disease of native coronary artery without angina pectoris: Secondary | ICD-10-CM | POA: Diagnosis present

## 2021-08-23 DIAGNOSIS — Z8616 Personal history of COVID-19: Secondary | ICD-10-CM | POA: Diagnosis not present

## 2021-08-23 DIAGNOSIS — F01518 Vascular dementia, unspecified severity, with other behavioral disturbance: Secondary | ICD-10-CM | POA: Diagnosis present

## 2021-08-23 DIAGNOSIS — Z885 Allergy status to narcotic agent status: Secondary | ICD-10-CM

## 2021-08-23 DIAGNOSIS — I4891 Unspecified atrial fibrillation: Secondary | ICD-10-CM | POA: Diagnosis present

## 2021-08-23 DIAGNOSIS — I480 Paroxysmal atrial fibrillation: Secondary | ICD-10-CM | POA: Diagnosis not present

## 2021-08-23 DIAGNOSIS — Z880 Allergy status to penicillin: Secondary | ICD-10-CM

## 2021-08-23 DIAGNOSIS — Z79899 Other long term (current) drug therapy: Secondary | ICD-10-CM | POA: Diagnosis not present

## 2021-08-23 DIAGNOSIS — E871 Hypo-osmolality and hyponatremia: Secondary | ICD-10-CM | POA: Diagnosis present

## 2021-08-23 DIAGNOSIS — I110 Hypertensive heart disease with heart failure: Secondary | ICD-10-CM | POA: Diagnosis present

## 2021-08-23 DIAGNOSIS — R296 Repeated falls: Secondary | ICD-10-CM | POA: Diagnosis present

## 2021-08-23 DIAGNOSIS — K219 Gastro-esophageal reflux disease without esophagitis: Secondary | ICD-10-CM | POA: Diagnosis present

## 2021-08-23 DIAGNOSIS — M25532 Pain in left wrist: Secondary | ICD-10-CM | POA: Diagnosis not present

## 2021-08-23 DIAGNOSIS — E8770 Fluid overload, unspecified: Principal | ICD-10-CM

## 2021-08-23 DIAGNOSIS — R404 Transient alteration of awareness: Secondary | ICD-10-CM

## 2021-08-23 DIAGNOSIS — Z9151 Personal history of suicidal behavior: Secondary | ICD-10-CM

## 2021-08-23 DIAGNOSIS — Z886 Allergy status to analgesic agent status: Secondary | ICD-10-CM

## 2021-08-23 DIAGNOSIS — M1990 Unspecified osteoarthritis, unspecified site: Secondary | ICD-10-CM | POA: Diagnosis present

## 2021-08-23 DIAGNOSIS — Z515 Encounter for palliative care: Secondary | ICD-10-CM

## 2021-08-23 DIAGNOSIS — Z951 Presence of aortocoronary bypass graft: Secondary | ICD-10-CM | POA: Diagnosis not present

## 2021-08-23 DIAGNOSIS — Z7189 Other specified counseling: Secondary | ICD-10-CM | POA: Diagnosis not present

## 2021-08-23 DIAGNOSIS — R000 Tachycardia, unspecified: Secondary | ICD-10-CM

## 2021-08-23 DIAGNOSIS — E872 Acidosis, unspecified: Secondary | ICD-10-CM | POA: Diagnosis present

## 2021-08-23 DIAGNOSIS — G9340 Encephalopathy, unspecified: Secondary | ICD-10-CM | POA: Diagnosis present

## 2021-08-23 DIAGNOSIS — I252 Old myocardial infarction: Secondary | ICD-10-CM | POA: Diagnosis not present

## 2021-08-23 DIAGNOSIS — Z888 Allergy status to other drugs, medicaments and biological substances status: Secondary | ICD-10-CM

## 2021-08-23 DIAGNOSIS — W19XXXA Unspecified fall, initial encounter: Secondary | ICD-10-CM | POA: Diagnosis present

## 2021-08-23 DIAGNOSIS — Z7901 Long term (current) use of anticoagulants: Secondary | ICD-10-CM

## 2021-08-23 DIAGNOSIS — Z66 Do not resuscitate: Secondary | ICD-10-CM | POA: Diagnosis present

## 2021-08-23 DIAGNOSIS — R0609 Other forms of dyspnea: Secondary | ICD-10-CM | POA: Diagnosis not present

## 2021-08-23 DIAGNOSIS — N393 Stress incontinence (female) (male): Secondary | ICD-10-CM | POA: Diagnosis present

## 2021-08-23 DIAGNOSIS — R54 Age-related physical debility: Secondary | ICD-10-CM | POA: Diagnosis present

## 2021-08-23 DIAGNOSIS — Z9049 Acquired absence of other specified parts of digestive tract: Secondary | ICD-10-CM

## 2021-08-23 DIAGNOSIS — E785 Hyperlipidemia, unspecified: Secondary | ICD-10-CM | POA: Diagnosis present

## 2021-08-23 DIAGNOSIS — R0602 Shortness of breath: Secondary | ICD-10-CM

## 2021-08-23 DIAGNOSIS — I4821 Permanent atrial fibrillation: Secondary | ICD-10-CM | POA: Diagnosis present

## 2021-08-23 DIAGNOSIS — Z8673 Personal history of transient ischemic attack (TIA), and cerebral infarction without residual deficits: Secondary | ICD-10-CM

## 2021-08-23 DIAGNOSIS — I509 Heart failure, unspecified: Secondary | ICD-10-CM

## 2021-08-23 LAB — URINALYSIS, ROUTINE W REFLEX MICROSCOPIC
Bilirubin Urine: NEGATIVE
Glucose, UA: NEGATIVE mg/dL
Hgb urine dipstick: NEGATIVE
Ketones, ur: 20 mg/dL — AB
Leukocytes,Ua: NEGATIVE
Nitrite: NEGATIVE
Protein, ur: 100 mg/dL — AB
Specific Gravity, Urine: 1.016 (ref 1.005–1.030)
pH: 6 (ref 5.0–8.0)

## 2021-08-23 LAB — CBC WITH DIFFERENTIAL/PLATELET
Abs Immature Granulocytes: 0.03 10*3/uL (ref 0.00–0.07)
Basophils Absolute: 0.1 10*3/uL (ref 0.0–0.1)
Basophils Relative: 1 %
Eosinophils Absolute: 0.1 10*3/uL (ref 0.0–0.5)
Eosinophils Relative: 1 %
HCT: 41 % (ref 36.0–46.0)
Hemoglobin: 12.7 g/dL (ref 12.0–15.0)
Immature Granulocytes: 0 %
Lymphocytes Relative: 15 %
Lymphs Abs: 1.4 10*3/uL (ref 0.7–4.0)
MCH: 28.9 pg (ref 26.0–34.0)
MCHC: 31 g/dL (ref 30.0–36.0)
MCV: 93.4 fL (ref 80.0–100.0)
Monocytes Absolute: 1.2 10*3/uL — ABNORMAL HIGH (ref 0.1–1.0)
Monocytes Relative: 12 %
Neutro Abs: 6.8 10*3/uL (ref 1.7–7.7)
Neutrophils Relative %: 71 %
Platelets: 200 10*3/uL (ref 150–400)
RBC: 4.39 MIL/uL (ref 3.87–5.11)
RDW: 15.7 % — ABNORMAL HIGH (ref 11.5–15.5)
WBC: 9.4 10*3/uL (ref 4.0–10.5)
nRBC: 0 % (ref 0.0–0.2)

## 2021-08-23 LAB — COMPREHENSIVE METABOLIC PANEL
ALT: 13 U/L (ref 0–44)
AST: 22 U/L (ref 15–41)
Albumin: 3.2 g/dL — ABNORMAL LOW (ref 3.5–5.0)
Alkaline Phosphatase: 56 U/L (ref 38–126)
Anion gap: 11 (ref 5–15)
BUN: 15 mg/dL (ref 8–23)
CO2: 28 mmol/L (ref 22–32)
Calcium: 9 mg/dL (ref 8.9–10.3)
Chloride: 97 mmol/L — ABNORMAL LOW (ref 98–111)
Creatinine, Ser: 0.72 mg/dL (ref 0.44–1.00)
GFR, Estimated: >60 mL/min (ref >60)
Glucose, Bld: 108 mg/dL — ABNORMAL HIGH (ref 70–99)
Potassium: 4.6 mmol/L (ref 3.5–5.1)
Sodium: 136 mmol/L (ref 135–145)
Total Bilirubin: 1.4 mg/dL — ABNORMAL HIGH (ref 0.3–1.2)
Total Protein: 6.8 g/dL (ref 6.5–8.1)

## 2021-08-23 LAB — TROPONIN I (HIGH SENSITIVITY)
Troponin I (High Sensitivity): 19 ng/L — ABNORMAL HIGH (ref <18)
Troponin I (High Sensitivity): 18 ng/L — ABNORMAL HIGH (ref <18)

## 2021-08-23 LAB — AMMONIA: Ammonia: 13 umol/L (ref 9–35)

## 2021-08-23 LAB — PROTIME-INR
INR: 1.4 — ABNORMAL HIGH (ref 0.8–1.2)
Prothrombin Time: 16.7 seconds — ABNORMAL HIGH (ref 11.4–15.2)

## 2021-08-23 LAB — BRAIN NATRIURETIC PEPTIDE: B Natriuretic Peptide: 447.4 pg/mL — ABNORMAL HIGH (ref 0.0–100.0)

## 2021-08-23 LAB — TSH: TSH: 3.379 u[IU]/mL (ref 0.350–4.500)

## 2021-08-23 LAB — MAGNESIUM: Magnesium: 2 mg/dL (ref 1.7–2.4)

## 2021-08-23 MED ORDER — ACETAMINOPHEN 650 MG RE SUPP: 650.0000 mg | Freq: Four times a day (QID) | RECTAL | Status: DC | PRN

## 2021-08-23 MED ORDER — SENNOSIDES-DOCUSATE SODIUM 8.6-50 MG PO TABS: 1.0000 | ORAL_TABLET | Freq: Every evening | ORAL | Status: DC | PRN

## 2021-08-23 MED ORDER — ACETAMINOPHEN 325 MG PO TABS
650.0000 mg | ORAL_TABLET | Freq: Four times a day (QID) | ORAL | Status: DC | PRN
  Administered 2021-08-24: 650 mg via ORAL
  Filled 2021-08-23: qty 2

## 2021-08-23 MED ORDER — DULOXETINE HCL 60 MG PO CPEP
60.0000 mg | ORAL_CAPSULE | Freq: Every day | ORAL | Status: DC
  Administered 2021-08-24 – 2021-08-25 (×3): 60 mg via ORAL
  Filled 2021-08-23 (×3): qty 1

## 2021-08-23 MED ORDER — OXYBUTYNIN CHLORIDE ER 10 MG PO TB24
10.0000 mg | ORAL_TABLET | Freq: Every day | ORAL | Status: DC
Start: 2021-08-24 — End: 2021-08-24
  Filled 2021-08-23: qty 1

## 2021-08-23 MED ORDER — APIXABAN 5 MG PO TABS
5.0000 mg | ORAL_TABLET | Freq: Two times a day (BID) | ORAL | Status: DC
  Administered 2021-08-24 – 2021-08-25 (×5): 5 mg via ORAL
  Filled 2021-08-23 (×6): qty 1

## 2021-08-23 MED ORDER — DIVALPROEX SODIUM 250 MG PO DR TAB
250.0000 mg | DELAYED_RELEASE_TABLET | Freq: Two times a day (BID) | ORAL | Status: DC
Start: 2021-08-24 — End: 2021-08-26
  Administered 2021-08-24 – 2021-08-25 (×5): 250 mg via ORAL
  Filled 2021-08-23 (×6): qty 1

## 2021-08-23 MED ORDER — MIRABEGRON ER 25 MG PO TB24
25.0000 mg | ORAL_TABLET | Freq: Every day | ORAL | Status: DC
  Filled 2021-08-23: qty 1

## 2021-08-23 MED ORDER — METOPROLOL TARTRATE 25 MG PO TABS: 50.0000 mg | ORAL_TABLET | Freq: Two times a day (BID) | ORAL | Status: DC

## 2021-08-23 MED ORDER — DONEPEZIL HCL 10 MG PO TABS
10.0000 mg | ORAL_TABLET | Freq: Every day | ORAL | Status: DC
Start: 2021-08-24 — End: 2021-08-24
  Administered 2021-08-24: 10 mg via ORAL
  Filled 2021-08-23: qty 1

## 2021-08-23 MED ORDER — MELATONIN 5 MG PO TABS
5.0000 mg | ORAL_TABLET | Freq: Every day | ORAL | Status: DC
Start: 2021-08-24 — End: 2021-08-26
  Administered 2021-08-24 – 2021-08-25 (×3): 5 mg via ORAL
  Filled 2021-08-23 (×3): qty 1

## 2021-08-23 MED ORDER — NITROFURANTOIN MACROCRYSTAL 50 MG PO CAPS
50.0000 mg | ORAL_CAPSULE | Freq: Every day | ORAL | Status: DC
Start: 2021-08-24 — End: 2021-08-26
  Administered 2021-08-24 – 2021-08-25 (×2): 50 mg via ORAL
  Filled 2021-08-23 (×3): qty 1

## 2021-08-23 MED ORDER — MECLIZINE HCL 25 MG PO TABS: 25.0000 mg | ORAL_TABLET | Freq: Three times a day (TID) | ORAL | Status: DC | PRN

## 2021-08-23 MED ORDER — AMIODARONE HCL 200 MG PO TABS
100.0000 mg | ORAL_TABLET | Freq: Every day | ORAL | Status: DC
  Administered 2021-08-24 – 2021-08-28 (×3): 100 mg via ORAL
  Filled 2021-08-23 (×4): qty 1

## 2021-08-23 MED ORDER — METHENAMINE MANDELATE 1 G PO TABS
1.0000 g | ORAL_TABLET | Freq: Two times a day (BID) | ORAL | Status: DC
  Administered 2021-08-24 – 2021-08-25 (×4): 1 g via ORAL
  Filled 2021-08-23 (×7): qty 1

## 2021-08-23 MED ORDER — METOPROLOL TARTRATE 50 MG PO TABS
50.0000 mg | ORAL_TABLET | Freq: Two times a day (BID) | ORAL | Status: DC
  Administered 2021-08-23: 50 mg via ORAL
  Filled 2021-08-23: qty 2

## 2021-08-23 MED ORDER — FUROSEMIDE 10 MG/ML IJ SOLN
40.0000 mg | Freq: Every day | INTRAMUSCULAR | Status: DC
  Administered 2021-08-23 – 2021-08-24 (×2): 40 mg via INTRAVENOUS
  Filled 2021-08-23 (×2): qty 4

## 2021-08-23 MED ORDER — EZETIMIBE 10 MG PO TABS
10.0000 mg | ORAL_TABLET | Freq: Every day | ORAL | Status: DC
  Administered 2021-08-24 – 2021-08-25 (×2): 10 mg via ORAL
  Filled 2021-08-23 (×3): qty 1

## 2021-08-23 NOTE — ED Notes (Signed)
Report handed off to Linda RN.  ?

## 2021-08-23 NOTE — ED Notes (Signed)
Pt in room 31 with admit providers at this time ?

## 2021-08-23 NOTE — Hospital Course (Addendum)
88 year old person living with vascular dementia at an assisted living facility admitted to our service with acute on chronic heart failure with preserved ejection fraction, she has permanent atrial fibrillation but now with mild RVR, and has had a declining functional status over the last 2 weeks.   ? ?Acute on chronic heart failure with preserved ejection fraction ?The patient has a history of CHF with most recent echo in 2018 showing EF 60-65% and G1DD, who presents here today with complaints including DOE and worsening LE swelling.  Daughter also states that patient has not followed up with cardiology in several months, and the patient has been on Bumex in the past but has not been on this medication for some time.  Review of records also shows that she has been on amiodarone for her afib in the past but daughter is not familiar with this medication when asked about it today.  On arrival, patient is hypertensive with a wide range of systolics ranging in the 154M-086P, but she is otherwise stable and satting well on room air.  She does appear volume overloaded on exam today.  BNP of 447 which is twice the value of her last one obtained about 4 months ago.  Trop x2 flat. Her findings are consistent with a CHF exacerbation (there has been an extended period of time between now and her last follow-up. Her A-fib with RVR also could be a contributing factor).  ?She responded well to diuresis with return to euvolemic state. Echo showed EF 60 to 65% with normal function of left ventricle, no regional wall motion abnormalities.  Right ventricle is mildly reduced. ? ?Vascular dementia ?Acute encephalopathy of unknown origin ?Patient presented with 2-week history of declining interaction, decreased p.o. intake, and functional decline.  Initial work-up was negative for infective cause with UA being negative for leukocytes and nitrites.  On hospital day 2 her mentation worsened and she was not consistently answering  questions.  Concern for acute neurological deficit as patient would not follow commands and was not moving left extremity. CT and MRI was negative for acute stroke.  Workup for reversible causes negative and not on centrally acting medications that could explain decline. Suspect this is likely end stage dementia, from progression of her known dx of vascular dementia. [place] discussion with palliative care team arranged to discuss prognosis and pt's wishes. Pt's daughter and son state that this is not consistent with the QOL she would want, and they wish to proceed with comfort care measures and transfer to [facility].  ?

## 2021-08-23 NOTE — ED Triage Notes (Signed)
Pt arrived via [facility] from [facility] with c/c of Failure to Thrive. Per EMS pt was recently seen for fall and AMS. Pt found to be more altered with food surrounding her. Pt was sent over because daughter is requesting a cardiac work-up due to swelling bilateral extremities. ? ? ?142/112, 80HR, 95%   ?

## 2021-08-23 NOTE — ED Provider Notes (Signed)
MSE was initiated and I personally evaluated the patient and placed orders (if any) at  2:32 PM on August 23, 2021. ? ?The patient appears stable so that the remainder of the MSE may be completed by another provider. ? ?Marissa Bowen is a 88 y.o. female.  Level 5 caveat secondary to dementia.  Patient is a new resident at [facility].  She has been in the ED 3 times this week.  Initially was for malaise and then yesterday was here for a fall.  Today daughter states she was confused when she went over to meet her.  Less responsive.  Also was concerned she is got new swelling on her feet.  She has not seen a cardiologist since they moved back here from [place].  ? ?Heart is irregularly irregular and rapid.  Lungs are clear.  She has 1+ edema of her feet.  Abdomen is soft without guarding or rebound. ?  ?Hayden Rasmussen, MD ?08/23/21 1815 ? ?

## 2021-08-23 NOTE — ED Provider Notes (Signed)
?[facility] ?Provider Note ? ? ?CSN: [id number] ?Arrival date & time: 08/23/21  1345 ? ?  ? ?History ? ?Chief Complaint  ?Patient presents with  ? Altered Mental Status  ? ? ?Marissa Bowen is a 88 y.o. female. ? ?The history is provided by medical records. No language interpreter was used.  ?Altered Mental Status ?Presenting symptoms: confusion (resolved)   ?Severity:  Moderate ?Episode history:  Single ?Timing:  Unable to specify ?Progression:  Unable to specify ?Context: not head injury (yesterday fall?)   ?Associated symptoms: no abdominal pain, no agitation, no difficulty breathing, no fever, no light-headedness, no nausea, no palpitations, no rash, no seizures and no vomiting   ? ?  ? ?Home Medications ?Prior to Admission medications   ?Medication Sig Start Date End Date Taking? Authorizing Provider  ?acetaminophen (TYLENOL) 325 MG tablet Take 325 mg by mouth every 6 (six) hours as needed for moderate pain or mild pain.    [provider]  ?amiodarone (PACERONE) 200 MG tablet TAKE ONE-HALF TABLET DAILY ?Patient not taking: Reported on 04/03/2021 01/08/17   Gildardo Cranker, DO  ?bumetanide (BUMEX) 1 MG tablet Take 1 mg by mouth daily.    [provider]  ?divalproex (DEPAKOTE) 125 MG DR tablet Take 125 mg by mouth daily.    [provider]  ?divalproex (DEPAKOTE) 250 MG DR tablet Take 250 mg by mouth 2 (two) times daily.    [provider]  ?donepezil (ARICEPT) 10 MG tablet Take 10 mg by mouth at bedtime.    [provider]  ?DULoxetine (CYMBALTA) 60 MG capsule Take 1 capsule (60 mg total) by mouth daily. For mood and chronic pain ?Patient taking differently: Take 60 mg by mouth at bedtime. 01/29/17   Gildardo Cranker, DO  ?ELIQUIS 5 MG TABS tablet Take 1 tablet (5 mg total) by mouth 2 (two) times daily. 07/26/16   Josue Hector, MD  ?ezetimibe (ZETIA) 10 MG tablet Take 1 tablet (10 mg total) by mouth daily. ?Patient taking  differently: Take 10 mg by mouth at bedtime. 02/26/17   Dessa Phi, DO  ?meclizine (ANTIVERT) 25 MG tablet Take 25 mg by mouth every 8 (eight) hours as needed for dizziness.    [provider]  ?melatonin 5 MG TABS Take 5 mg by mouth at bedtime.    [provider]  ?methenamine (HIPREX) 1 g tablet Take 1 g by mouth 2 (two) times daily.    [provider]  ?metoprolol tartrate (LOPRESSOR) 50 MG tablet Take 50 mg by mouth 2 (two) times daily.    [provider]  ?mirabegron ER (MYRBETRIQ) 25 MG TB24 tablet Take 25 mg by mouth daily.    [provider]  ?Multiple Vitamin (MULTIVITAMIN WITH MINERALS) TABS tablet Take 1 tablet by mouth daily.    [provider]  ?Multiple Vitamins-Minerals (PRESERVISION AREDS) CAPS Take 1 capsule by mouth 2 (two) times daily.    [provider]  ?nitrofurantoin (MACRODANTIN) 50 MG capsule Take 50 mg by mouth daily. For prophylaxis    [provider]  ?nitroGLYCERIN (NITROSTAT) 0.4 MG SL tablet Place 0.4 mg under the tongue every 5 (five) minutes x 3 doses as needed for chest pain.    [provider]  ?pantoprazole (PROTONIX) 40 MG tablet TAKE ONE TABLET TWICE DAILY ?Patient taking differently: Take 40 mg by mouth daily. 09/17/16   Gildardo Cranker, DO  ?tolterodine (DETROL) 2 MG tablet Take 2 mg by mouth  daily as needed (overactive bladder).    [provider]  ?vitamin C (ASCORBIC ACID) 250 MG tablet Take 250 mg by mouth daily.    [provider]  ?sertraline (ZOLOFT) 25 MG tablet Take 1 tablet (25 mg total) by mouth daily. 02/14/11 10/19/11  Marletta Lor, MD  ?   ? ?Allergies    ?Asa [aspirin], Crestor [rosuvastatin calcium], Hydrocodone, Ibuprofen, Oxycodone hcl, Penicillins, Prednisone, Statins, Morphine and related, and Pristiq [desvenlafaxine]   ? ?Review of Systems   ?Review of Systems  ?Constitutional:  Positive for fatigue. Negative for chills, diaphoresis and fever.   ?HENT:  Negative for congestion.   ?Respiratory:  Negative for cough, chest tightness and shortness of breath.   ?Cardiovascular:  Positive for leg swelling. Negative for chest pain and palpitations.  ?Gastrointestinal:  Negative for abdominal pain, constipation, diarrhea, nausea and vomiting.  ?Genitourinary:  Negative for dysuria.  ?Musculoskeletal:  Negative for back pain, neck pain and neck stiffness.  ?Skin:  Negative for rash and wound.  ?Neurological:  Negative for seizures, light-headedness and numbness.  ?Psychiatric/Behavioral:  Positive for confusion (resolved). Negative for agitation.   ?All other systems reviewed and are negative. ? ?Physical Exam ?Updated Vital Signs ?BP (!) 133/96   Pulse (!) 114   Temp 97.6 ?F (36.4 ?C) (Oral)   Resp 19   Ht 5' 7.5" (1.715 m)   Wt 75.7 kg   SpO2 96%   BMI 25.75 kg/m?  ?Physical Exam ?Vitals and nursing note reviewed.  ?Constitutional:   ?   General: She is not in acute distress. ?   Appearance: She is well-developed. She is not ill-appearing, toxic-appearing or diaphoretic.  ?HENT:  ?   Head: Normocephalic and atraumatic.  ?   Nose: No congestion or rhinorrhea.  ?   Mouth/Throat:  ?   Mouth: Mucous membranes are moist.  ?   Pharynx: No oropharyngeal exudate or posterior oropharyngeal erythema.  ?Eyes:  ?   Extraocular Movements: Extraocular movements intact.  ?   Conjunctiva/sclera: Conjunctivae normal.  ?   Pupils: Pupils are equal, round, and reactive to light.  ?Cardiovascular:  ?   Rate and Rhythm: Normal rate and regular rhythm.  ?   Heart sounds: No murmur heard. ?Pulmonary:  ?   Effort: Pulmonary effort is normal. No respiratory distress.  ?   Breath sounds: Rales present. No wheezing or rhonchi.  ?Chest:  ?   Chest wall: No tenderness.  ?Abdominal:  ?   General: Abdomen is flat.  ?   Palpations: Abdomen is soft.  ?   Tenderness: There is no abdominal tenderness. There is no right CVA tenderness, left CVA tenderness, guarding or rebound.   ?Musculoskeletal:     ?   General: No swelling or tenderness.  ?   Cervical back: Neck supple. No tenderness.  ?   Right lower leg: Edema present.  ?   Left lower leg: Edema present.  ?Skin: ?   General: Skin is warm and dry.  ?   Capillary Refill: Capillary refill takes less than 2 seconds.  ?   Findings: No erythema or rash.  ?Neurological:  ?   General: No focal deficit present.  ?   Mental Status: She is alert.  ?   Sensory: No sensory deficit.  ?   Motor: No weakness.  ?Psychiatric:     ?   Mood and Affect: Mood normal.  ? ? ?ED Results / Procedures / Treatments   ?Labs ?(all labs ordered  are listed, but only abnormal results are displayed) ?Labs Reviewed  ?COMPREHENSIVE METABOLIC PANEL - Abnormal; Notable for the following components:  ?    Result Value  ? Chloride 97 (*)   ? Glucose, Bld 108 (*)   ? Albumin 3.2 (*)   ? Total Bilirubin 1.4 (*)   ? All other components within normal limits  ?BRAIN NATRIURETIC PEPTIDE - Abnormal; Notable for the following components:  ? B Natriuretic Peptide 447.4 (*)   ? All other components within normal limits  ?CBC WITH DIFFERENTIAL/PLATELET - Abnormal; Notable for the following components:  ? RDW 15.7 (*)   ? Monocytes Absolute 1.2 (*)   ? All other components within normal limits  ?PROTIME-INR - Abnormal; Notable for the following components:  ? Prothrombin Time 16.7 (*)   ? INR 1.4 (*)   ? All other components within normal limits  ?URINALYSIS, ROUTINE W REFLEX MICROSCOPIC - Abnormal; Notable for the following components:  ? Ketones, ur 20 (*)   ? Protein, ur 100 (*)   ? Bacteria, UA RARE (*)   ? All other components within normal limits  ?TROPONIN I (HIGH SENSITIVITY) - Abnormal; Notable for the following components:  ? Troponin I (High Sensitivity) 19 (*)   ? All other components within normal limits  ?TROPONIN I (HIGH SENSITIVITY) - Abnormal; Notable for the following components:  ? Troponin I (High Sensitivity) 18 (*)   ? All other components within normal limits   ?URINE CULTURE  ?AMMONIA  ?TSH  ?MAGNESIUM  ?BASIC METABOLIC PANEL  ?CBC  ?VITAMIN D 25 HYDROXY (VIT D DEFICIENCY, FRACTURES)  ? ? ?EKG ?EKG Interpretation ? ?Date/Time:  Thursday August 23 2021 14:01:29 EDT ?Ventricular Rate:  133 ?PR Inte

## 2021-08-23 NOTE — H&P (Addendum)
? ? ? ?Date: 08/24/2021     ?     ?     ?Patient Name:  Marissa Bowen MRN: [id number]  ?DOB: 1926-07-29 Age / Sex: 88 y.o., female   ?PCP: System, Provider Not In    ?     ?Medical Service: Internal Medicine Teaching Service    ?     ?Attending Physician: Dr. Evette Doffing, Mallie Mussel, *    ?First Contact: Dr. Howie Ill Pager: [phone]  ?Second Contact: Dr. Collene Gobble Pager: [phone]  ?     ?After Hours (After 5p/  First Contact Pager: [phone]  ?weekends / holidays): Second Contact Pager: [phone]  ? ?Chief Complaint: Recurrent falls, generalized weakness, lower extremity swelling, confusion ? ?History of Present Illness: Marissa Bowen is a 88 y.o. female with a past medical history significant for CAD status post CABG, previous TIA/stroke, dementia, paroxysmal atrial fibrillation on Eliquis therapy, previous GI bleeding, previous diverticulitis, IBS, migraines, GERD, previous cholecystectomy, and most recent fall yesterday who presents with altered mental status, recurrent falls, generalized weakness, and worsening peripheral edema.  History was mainly provided by daughter at bedside. ? ?Patient's daughter states that the patient has fallen many times in the past 4 years, however daughter noticed a drastic change in the number of falls patient was sustaining over the past week.  Prior to falling, the patient will complain of some lightheadedness but no dizziness.  She did not lose consciousness but did bump her head once during a fall.  Daughter is concerned that patient does not have a good appetite and does not stay well-hydrated during the day.  She says that when the patient stands up too fast, her blood pressure will drop.  Also during this time the patient has become progressively weaker.  She has urinary incontinence, but this has been a chronic problem for her.  The patient has also had DOE for the past month as well as increased leg swelling which she noticed last week.  When asked more about the patient's  confusion, the daughter says that the patient is usually responsive to her and to staff members, however over the past week she has not been as responsive to anyone.  Daughter is also concerned because the patient has not been seen by cardiology (Dr. [name]) for a long time, stating that, back in December, the patient got COVID and was unable to attend her cardiology appointment.  Denies any chest pain, abdominal pain, or dysuria.  Denies recent illness.  There are no other complaints or concerns at this time. ? ?Meds:  ?Bumex 1 mg daily ?Depakote 125 mg daily ?Protonix 40 mg twice daily ?Amiodarone 100 mg daily ?Donepezil 10 mg daily ?Cymbalta 60 mg daily ?Apixaban 5 mg daily ?Zetia 10 mg daily ?Methenamine 1 g twice daily ?Metoprolol tartrate 50 mg twice daily ?Myrbetriq 25 mg daily ?Nitrofurantoin 50 mg daily ?Detrol 2 mg as needed ?Meclizine 25 mg as needed ? ? ?Allergies: ?Allergies as of 08/23/2021 - Review Complete 08/23/2021  ?Allergen Reaction Noted  ? Asa [aspirin] Other (See Comments) 12/26/2014  ? Crestor [rosuvastatin calcium] Other (See Comments) 02/23/2016  ? Hydrocodone Other (See Comments) 07/18/2009  ? Ibuprofen Other (See Comments)   ? Oxycodone hcl Other (See Comments) 07/18/2009  ? Penicillins Swelling and Rash   ? Prednisone Other (See Comments)   ? Statins Other (See Comments) 12/08/2008  ? Morphine and related Other (See Comments) 10/06/2016  ? Pristiq [desvenlafaxine] Other (See Comments) 12/14/2014  ? ?Past Medical History:  ?  Diagnosis Date  ? Anemia   ? Anisocoria 02/27/2017  ? Sees Dr Rutherford Guys  ? Anxiety   ? BACK PAIN, LUMBAR 09/27/2009  ? CAD (coronary artery disease)   ? a. s/p remote CABG 1997, 3V.;  b. LexiScan Myoview (8/15): No ischemia, EF 72%, normal study;  c. Echo 12/13 - EF 60-65%, no significant valvular abnormalities, normal RV size and systolic function.    ? CVA (cerebral infarction)   ? a. Thalamic infarct 09/2009. //  b. hx of TIA in 2008  ? Gastric ulcer   ? GERD  11/25/2006  ? GI bleed   ? a. 09/2009: secondary to antral ulcer.  ? Hiatal hernia   ? History of diverticulitis Dx 02/2012  ? History of MI (myocardial infarction)   ? HYPERLIPIDEMIA   ? HYPERTENSION   ? IBS 12/07/2008  ? MIGRAINE WITH AURA 08/13/2007  ? Mild carotid artery disease ([place])   ? a. 0-39% bilat 05/2011 (f/u recommended 05/2013). //  b. Carotid US 3/17 - Bilat ICA 1-39% >> FU prn  ? Muscle weakness (generalized) 09/27/2009  ? OSTEOARTHRITIS 08/13/2007  ? PAF (paroxysmal atrial fibrillation) ([place]) 11/25/2006  ? a. Multaq >> changes to Amiodarone 2/2 $$  //  Eliquis (CHADS2-VASc = 6)  ? Urinary, incontinence, stress female   ? wears Pessary  ? ?Family history: Mother died from HF.  ? ?Social history: Ambulates with walker.  Has been living in [facility] assisted [facility] since September, moved here from [place]. Gets about 2-3 days of PT a week. [facility] facility is recommending transfer to memory unit. No Tobacco use. Drank socially when she was younger.  ? ?Review of Systems: ?A complete ROS was negative except as per HPI. ? ?Physical Exam: ?Blood pressure (!) 152/93, pulse 91, temperature 97.7 ?F (36.5 ?C), temperature source Axillary, resp. rate 17, height 5' 7.5" (1.715 m), weight 75.7 kg, SpO2 96 %. ?General: NAD, nl appearance ?HE: Normocephalic, atraumatic, EOMI, Conjunctivae normal ?ENT: No congestion, no rhinorrhea, no exudate or erythema  ?Cardiovascular: Tachycardic, irregular rhythm. No murmurs, rubs, or gallops ?Pulmonary: Effort normal, distant crackles at bilateral lung bases. ?Abdominal: Soft, nontender, bowel sounds present ?Musculoskeletal: 2+ pitting edema in RLE, 1+ pitting edema in LLE.  No deformity, injury, or tenderness in extremities ?Skin: Warm, dry, no bruising, erythema, or rash ?Neurologic exam: Limited in the setting of some difficulty following commands, though the patient is alert and oriented to self, birthday, location (patient does not know she has at [facility] but does say she is  at hospital), and daughter's name.  Unable to state current year.  ?Cranial Nerves: ?            II: PERRL ?            III, IV, VI: Extra-occular motions intact bilaterally ?            V, VII: Face symmetric, sensation intact in all 3 divisions   ?Motor: Strength 4/5 in upper extremities, unable to test strength in lower extremities ?Sensory: Light touch intact and symmetric bilaterally  ?Psychiatric: Normal mood and affect ? ? ?EKG: personally reviewed my interpretation is atrial fibrillation with RVR and infrequent PVCs ? ?CXR, 08/23/2021: ?IMPRESSION: ?Cardiomegaly with developing pulmonary interstitial prominence, ?likely pulmonary venous congestion. ?  ?Left greater than right base opacities are unchanged including back ?to 04/19/2021, favoring scarring. ? ?CT Head without Contrast: ?IMPRESSION: ?Atrophy with small vessel chronic ischemic changes of deep cerebral ?white matter. ?  ?No acute intracranial  abnormalities. ? ? ? ?Assessment & Plan by Problem: ? ?Principal Problem: ?  Acute exacerbation of congestive heart failure ([place]) ?Active Problems: ?  Atrial fibrillation ([place]) ?  Dementia with behavioral disturbance ([place]) ? ?Acute on chronic diastolic HF ?The patient has a history of CHF with most recent echo in 2018 showing EF 60-65% and G1DD, who presents here today with complaints including DOE and worsening LE swelling.  Daughter also states that patient has not followed up with cardiology in several months, and the patient has been on Bumex in the past but has not been on this medication for some time.  Review of records also shows that she has been on amiodarone for her afib in the past but daughter is not familiar with this medication when asked about it today.  On arrival, patient is hypertensive with a wide range of systolics ranging in the 952W-413K, but she is otherwise stable and satting well on room air.  She does appear volume overloaded on exam today.  BNP of 447 which is twice the value of her  last one obtained about 4 months ago.  Trop x2 flat. Her findings are consistent with a CHF exacerbation (there has been an extended period of time between now and her last follow-up. Her A-fib with RVR also cou

## 2021-08-23 NOTE — ED Notes (Signed)
Pt in afib HR 150s, IM paged ?

## 2021-08-24 ENCOUNTER — Inpatient Hospital Stay (HOSPITAL_COMMUNITY): Payer: Medicare Other

## 2021-08-24 DIAGNOSIS — I5033 Acute on chronic diastolic (congestive) heart failure: Secondary | ICD-10-CM | POA: Diagnosis not present

## 2021-08-24 DIAGNOSIS — R0609 Other forms of dyspnea: Secondary | ICD-10-CM

## 2021-08-24 LAB — ECHOCARDIOGRAM COMPLETE
AR max vel: 2.4 cm2
AV Peak grad: 3.7 mmHg
Ao pk vel: 0.96 m/s
Area-P 1/2: 3.99 cm2
Calc EF: 57.3 %
Height: 67.5 in
S' Lateral: 3.1 cm
Single Plane A2C EF: 54.6 %
Single Plane A4C EF: 58.8 %
Weight: 2610.25 oz

## 2021-08-24 LAB — CBC
HCT: 38.4 % (ref 36.0–46.0)
Hemoglobin: 12.8 g/dL (ref 12.0–15.0)
MCH: 29.9 pg (ref 26.0–34.0)
MCHC: 33.3 g/dL (ref 30.0–36.0)
MCV: 89.7 fL (ref 80.0–100.0)
Platelets: 209 10*3/uL (ref 150–400)
RBC: 4.28 MIL/uL (ref 3.87–5.11)
RDW: 15.9 % — ABNORMAL HIGH (ref 11.5–15.5)
WBC: 10.8 10*3/uL — ABNORMAL HIGH (ref 4.0–10.5)
nRBC: 0 % (ref 0.0–0.2)

## 2021-08-24 LAB — BASIC METABOLIC PANEL
Anion gap: 11 (ref 5–15)
BUN: 14 mg/dL (ref 8–23)
CO2: 27 mmol/L (ref 22–32)
Calcium: 8.8 mg/dL — ABNORMAL LOW (ref 8.9–10.3)
Chloride: 94 mmol/L — ABNORMAL LOW (ref 98–111)
Creatinine, Ser: 0.7 mg/dL (ref 0.44–1.00)
GFR, Estimated: >60 mL/min (ref >60)
Glucose, Bld: 117 mg/dL — ABNORMAL HIGH (ref 70–99)
Potassium: 3.7 mmol/L (ref 3.5–5.1)
Sodium: 132 mmol/L — ABNORMAL LOW (ref 135–145)

## 2021-08-24 LAB — MAGNESIUM: Magnesium: 1.8 mg/dL (ref 1.7–2.4)

## 2021-08-24 LAB — VITAMIN D 25 HYDROXY (VIT D DEFICIENCY, FRACTURES): Vit D, 25-Hydroxy: 48.62 ng/mL (ref 30–100)

## 2021-08-24 MED ORDER — MAGNESIUM SULFATE 2 GM/50ML IV SOLN
2.0000 g | Freq: Once | INTRAVENOUS | Status: AC
  Administered 2021-08-24: 2 g via INTRAVENOUS
  Filled 2021-08-24: qty 50

## 2021-08-24 MED ORDER — POTASSIUM CHLORIDE 20 MEQ PO PACK
20.0000 meq | PACK | Freq: Two times a day (BID) | ORAL | Status: AC
  Administered 2021-08-24 (×2): 20 meq via ORAL
  Filled 2021-08-24 (×2): qty 1

## 2021-08-24 MED ORDER — METOPROLOL TARTRATE 25 MG PO TABS
75.0000 mg | ORAL_TABLET | Freq: Two times a day (BID) | ORAL | Status: DC
  Administered 2021-08-24: 75 mg via ORAL
  Filled 2021-08-24: qty 1

## 2021-08-24 NOTE — Progress Notes (Signed)
? ?HD#1 ?SUBJECTIVE:  ?Patient Summary: Marissa Bowen is a 88 y.o. with a pertinent PMH of vascular dementia, heart failure with preserved ejection fraction, permanent atrial fibrillation, who presented with lower extremity edema, and declining functional status and admitted for acute on chronic heart failure exacerbation.  ? ?Overnight Events: None ? ?Interim History: Patient assessed at bedside this AM.  Her daughter is at bedside, Marissa Bowen.  As well as able to identify her daughter, but is unsure of what brought her into the hospital.  Her daughter states that she has noticed a change in her functional status over the last 2 weeks, but no new medications have been added and no identifiable trigger.  She currently lives at [facility] and family is considering moving her into the memory unit. ? ?OBJECTIVE:  ?Vital Signs: ?Vitals:  ? 08/24/21 0134 08/24/21 0516 08/24/21 0729 08/24/21 1226  ?BP: (!) 107/95 (!) 159/94 (!) 167/119 (!) 157/121  ?Pulse: (!) 118 84 99 (!) 115  ?Resp: '20 20 20 18  '$ ?Temp: 97.9 ?F (36.6 ?C) 98.4 ?F (36.9 ?C) 98.7 ?F (37.1 ?C) 98.6 ?F (37 ?C)  ?TempSrc: Axillary Oral Oral Oral  ?SpO2: 91% 93%    ?Weight:  74 kg    ?Height:      ? ?Supplemental O2: Room Air ?SpO2: 93 % ?O2 Flow Rate (L/min): 0 L/min ? ?Filed Weights  ? 08/23/21 1400 08/24/21 0516  ?Weight: 75.7 kg 74 kg  ? ? ? ?Intake/Output Summary (Last 24 hours) at 08/24/2021 1427 ?Last data filed at 08/24/2021 1401 ?Gross per 24 hour  ?Intake 240 ml  ?Output 2200 ml  ?Net -1960 ml  ? ?Net IO Since Admission: -1,960 mL [08/24/21 1427] ? ?Physical Exam: ?Constitutional: Alert, laying in bed  ?HENT: Normocephalic ?Neck: JVD present ?Cardiovascular: tachycardiac, irregularly irregular, no M/R/G ?Pulmonary/Chest: Normal work of breathing on room air, lungs are clear to auscultation bilaterally ?MSK: Moves extremities spontaneously, 1+ edema present in lower extremities bilaterally ?Neurological: Oriented to self only at baseline ?Psych:  Pleasant ? ?Patient Lines/Drains/Airways Status   ? ? Active Line/Drains/Airways   ? ? Name Placement date Placement time Site Days  ? Peripheral IV 08/23/21 20 G Left Antecubital 08/23/21  1610  Antecubital  1  ? External Urinary Catheter 08/24/21  0005  --  less than 1  ? Incision (Closed) 01/03/15 Abdomen Other (Comment) 01/03/15  1011  -- 2425  ? Incision - 4 Ports Abdomen 1: Umbilicus 2: Mid;Upper 3: Right;Medial 4: Right;Lateral 01/03/15  --  -- 2425  ? Wound / Incision (Open or Dehisced) 08/24/21 Non-pressure wound Thigh Left;Posterior skin tear or abrasion looking (non-pressure related) - present on admission 08/24/21  0005  Thigh  less than 1  ? ?  ?  ? ?  ? ? ?Pertinent Labs: ? ?  Latest Ref Rng & Units 08/24/2021  ?  3:39 AM 08/23/2021  ?  4:04 PM 08/22/2021  ?  3:40 AM  ?CBC  ?WBC 4.0 - 10.5 K/uL 10.8   9.4   9.9    ?Hemoglobin 12.0 - 15.0 g/dL 12.8   12.7   13.2    ?Hematocrit 36.0 - 46.0 % 38.4   41.0   41.4    ?Platelets 150 - 400 K/uL 209   200   201    ? ? ? ?  Latest Ref Rng & Units 08/24/2021  ?  3:39 AM 08/23/2021  ?  4:04 PM 08/22/2021  ?  3:40 AM  ?CMP  ?Glucose 70 -  99 mg/dL 117   108   127    ?BUN 8 - 23 mg/dL '14   15   15    '$ ?Creatinine 0.44 - 1.00 mg/dL 0.70   0.72   0.73    ?Sodium 135 - 145 mmol/L 132   136   138    ?Potassium 3.5 - 5.1 mmol/L 3.7   4.6   3.8    ?Chloride 98 - 111 mmol/L 94   97   99    ?CO2 22 - 32 mmol/L '27   28   29    '$ ?Calcium 8.9 - 10.3 mg/dL 8.8   9.0   9.1    ?Total Protein 6.5 - 8.1 g/dL  6.8   6.8    ?Total Bilirubin 0.3 - 1.2 mg/dL  1.4   0.8    ?Alkaline Phos 38 - 126 U/L  56   65    ?AST 15 - 41 U/L  22   21    ?ALT 0 - 44 U/L  13   13    ? ? ?No results for input(s): GLUCAP in the last 72 hours.  ? ?Pertinent Imaging: ?DG Chest 1 View ? ?Result Date: 08/23/2021 ?CLINICAL DATA:  Weakness and shortness of breath EXAM: CHEST  1 VIEW COMPARISON:  Yesterday FINDINGS: Median sternotomy for CABG. Numerous leads and wires project over the chest. midline trachea.  Cardiomegaly accentuated by AP portable technique. Developing pulmonary interstitial prominence. No pleural effusion or pneumothorax. Left greater than right base airspace opacities are not significantly changed. IMPRESSION: Cardiomegaly with developing pulmonary interstitial prominence, likely pulmonary venous congestion. Left greater than right base opacities are unchanged including back to 04/19/2021, favoring scarring. Aortic Atherosclerosis (ICD10-I70.0). Electronically Signed   By: Abigail Miyamoto M.D.   On: 08/23/2021 15:04  ? ?CT Head Wo Contrast ? ?Result Date: 08/23/2021 ?CLINICAL DATA:  Altered mental status of unknown cause EXAM: CT HEAD WITHOUT CONTRAST TECHNIQUE: Contiguous axial images were obtained from the base of the skull through the vertex without intravenous contrast. RADIATION DOSE REDUCTION: This exam was performed according to the departmental dose-optimization program which includes automated exposure control, adjustment of the mA and/or kV according to patient size and/or use of iterative reconstruction technique. COMPARISON:  08/22/2021 FINDINGS: Brain: Generalized atrophy. Normal ventricular morphology. No midline shift or mass effect. Small vessel chronic ischemic changes of deep cerebral white matter. No intracranial hemorrhage, mass lesion, evidence of acute infarction, or extra-axial fluid collection. Vascular: Atherosclerotic calcification of internal carotid and vertebral arteries at skull base Skull: Intact Sinuses/Orbits: Clear Other: N/A IMPRESSION: Atrophy with small vessel chronic ischemic changes of deep cerebral white matter. No acute intracranial abnormalities. Electronically Signed   By: Lavonia Dana M.D.   On: 08/23/2021 15:17  ? ?ECHOCARDIOGRAM COMPLETE ? ?Result Date: 08/24/2021 ?   ECHOCARDIOGRAM REPORT   Patient Name:   Marissa Bowen Date of Exam: 08/24/2021 Medical Rec #:  [id number]      Height:       67.5 in Accession #:    [id number]     Weight:       163.1 lb Date of  Birth:  Mar 04, 1927       BSA:          1.865 m? Patient Age:    82 years       BP:           159/94 mmHg Patient Gender: F  HR:           93 bpm. Exam Location:  Inpatient Procedure: 2D Echo, Cardiac Doppler and Color Doppler Indications:    Dyspnea  History:        Patient has no prior history of Echocardiogram examinations.                 CHF, CAD, TIA, Arrythmias:Atrial Fibrillation; Risk                 Factors:Hypertension.  Sonographer:    Jyl Heinz Referring Phys: [id number] [place]  1. Left ventricular ejection fraction, by estimation, is 60 to 65%. The left ventricle has normal function. The left ventricle has no regional wall motion abnormalities. Left ventricular diastolic parameters are consistent with Grade I diastolic dysfunction (impaired relaxation).  2. Right ventricular systolic function is mildly reduced. The right ventricular size is mildly enlarged. There is moderately elevated pulmonary artery systolic pressure. The estimated right ventricular systolic pressure is 54.4 mmHg.  3. Left atrial size was mildly dilated.  4. Right atrial size was mildly dilated.  5. The mitral valve is normal in structure. Mild mitral valve regurgitation. No evidence of mitral stenosis.  6. Tricuspid valve regurgitation is moderate.  7. The aortic valve is normal in structure. Aortic valve regurgitation is not visualized. No aortic stenosis is present.  8. The inferior vena cava is normal in size with greater than 50% respiratory variability, suggesting right atrial pressure of 3 mmHg. FINDINGS  Left Ventricle: Left ventricular ejection fraction, by estimation, is 60 to 65%. The left ventricle has normal function. The left ventricle has no regional wall motion abnormalities. The left ventricular internal cavity size was normal in size. There is  no left ventricular hypertrophy. Left ventricular diastolic parameters are consistent with Grade I diastolic dysfunction (impaired  relaxation). Right Ventricle: The right ventricular size is mildly enlarged. No increase in right ventricular wall thickness. Right ventricular systolic function is mildly reduced. There is moderately elevated pulmonary artery sy

## 2021-08-24 NOTE — Evaluation (Signed)
Physical Therapy Evaluation ?Patient Details ?Name: Marissa Bowen ?MRN: [id number] ?DOB: 10/04/1926 ?Today's Date: 08/24/2021 ? ?History of Present Illness ? 88 y.o. female admitted 4/27 with fall, AMS, weakness and edema. Pt with acute on chronic CHf and Afib. PMhx: CAD s/p CABG, CVA, dementia, paroxysmal Afib on Eliquis, GIB, diverticulitis, IBS, migraines, GERD, cholecystectomy, HTN, anxiety  ?Clinical Impression ? PT with flat affect and oriented to self only. Pt unable to provide PLOF and lives at [facility]. PT currently unable to stand and requiring mod assist for bed level mobility. Pt with HR 102-160 with fluctuant rate with rate sustained 111-135 with limited mobility. Pt with decreased activity tolerance, balance, strength and function who will benefit from acute therapy to maximize mobility and safety to decrease burden of care. Anticipate pt will need higher level of care than ALF. ? ?Supine 153/107 (119), HR 112 ?Sitting 150/122 (132), HR 157   ?   ? ?Recommendations for follow up therapy are one component of a multi-disciplinary discharge planning process, led by the attending physician.  Recommendations may be updated based on patient status, additional functional criteria and insurance authorization. ? ?Follow Up Recommendations Skilled nursing-short term rehab (<3 hours/day) ? ?  ?Assistance Recommended at Discharge Frequent or constant Supervision/Assistance  ?Patient can return home with the following ? A lot of help with bathing/dressing/bathroom;Two people to help with walking and/or transfers;Assistance with cooking/housework;Assist for transportation;Direct supervision/assist for medications management;Direct supervision/assist for financial management ? ?  ?Equipment Recommendations Wheelchair (measurements PT);Wheelchair cushion (measurements PT)  ?Recommendations for Other Services ?    ?  ?Functional Status Assessment Patient has had a recent decline in their functional status and/or demonstrates  limited ability to make significant improvements in function in a reasonable and predictable amount of time  ? ?  ?Precautions / Restrictions Precautions ?Precautions: Other (comment) ?Precaution Comments: watch HR, incontinent  ? ?  ? ?Mobility ? Bed Mobility ?Overal bed mobility: Needs Assistance ?Bed Mobility: Sit to Supine, Supine to Sit ?  ?  ?Supine to sit: Mod assist ?Sit to supine: Mod assist ?  ?General bed mobility comments: mod assist to rise to sitting with mod multimodal cues, posterior lean with mod assist for balance EOB grossly 8 min. Assist to lift legs and return to bed. Max assist to slide toward [facility] ?  ? ?Transfers ?Overall transfer level: Needs assistance ?  ?Transfers: Sit to/from Stand ?  ?  ?  ?  ?  ?  ?General transfer comment: attempted to rise from elevated surface x 3 trials with use of pad and unable to shift weight and rise with +1 assist ?  ? ?Ambulation/Gait ?  ?  ?  ?  ?  ?  ?  ?General Gait Details: currently unable ? ?Stairs ?  ?  ?  ?  ?  ? ?Wheelchair Mobility ?  ? ?Modified Rankin (Stroke Patients Only) ?  ? ?  ? ?Balance Overall balance assessment: Needs assistance ?Sitting-balance support: Bilateral upper extremity supported, No upper extremity supported, Feet supported ?Sitting balance-Leahy Scale: Poor ?Sitting balance - Comments: mod assist for sitting with and without UE support, posterior lean ?Postural control: Posterior lean ?  ?  ?  ?  ?  ?  ?  ?  ?  ?  ?  ?  ?  ?  ?   ? ? ? ?Pertinent Vitals/Pain Pain Assessment ?Pain Assessment: No/denies pain  ? ? ?Home Living Family/patient expects to be discharged to:: Assisted living ?  ?  ?  ?  ?  ?  ?  ?  ?  Home Equipment: Conservation officer, nature (2 wheels) ?Additional Comments: Resident at [facility]F- pt unable to provide PLOF and taken from last admission  ?  ?Prior Function   ?  ?  ?  ?  ?  ?  ?Mobility Comments: Pt reports ambulatory with RW; suspect staff assists with mobility as needed ?ADLs Comments: Pt reports meals provided,  staff assist with bathing, dressing ?  ? ? ?Hand Dominance  ?   ? ?  ?Extremity/Trunk Assessment  ? Upper Extremity Assessment ?Upper Extremity Assessment: Generalized weakness ?  ? ?Lower Extremity Assessment ?Lower Extremity Assessment: Generalized weakness ?  ? ?Cervical / Trunk Assessment ?Cervical / Trunk Assessment: Kyphotic  ?Communication  ? Communication: No difficulties  ?Cognition Arousal/Alertness: Awake/alert ?Behavior During Therapy: Flat affect ?Overall Cognitive Status: Impaired/Different from baseline ?Area of Impairment: Orientation, Attention, Memory, Following commands, Safety/judgement, Problem solving ?  ?  ?  ?  ?  ?  ?  ?  ?Orientation Level: Disoriented to, Time, Place, Situation ?Current Attention Level: Focused ?Memory: Decreased short-term memory ?Following Commands: Follows one step commands inconsistently, Follows one step commands with increased time ?Safety/Judgement: Decreased awareness of deficits, Decreased awareness of safety ?  ?Problem Solving: Slow processing, Decreased initiation, Difficulty sequencing, Requires verbal cues ?  ?  ?  ? ?  ?General Comments   ? ?  ?Exercises    ? ?Assessment/Plan  ?  ?PT Assessment Patient needs continued PT services  ?PT Problem List Decreased strength;Decreased mobility;Decreased safety awareness;Decreased coordination;Decreased activity tolerance;Decreased cognition;Decreased balance;Decreased knowledge of use of DME ? ?   ?  ?PT Treatment Interventions DME instruction;Therapeutic exercise;Gait training;Balance training;Functional mobility training;Cognitive remediation;Therapeutic activities;Patient/family education;Neuromuscular re-education   ? ?PT Goals (Current goals can be found in the Care Plan section)  ?Acute Rehab PT Goals ?PT Goal Formulation: Patient unable to participate in goal setting ?Time For Goal Achievement: 09/07/21 ?Potential to Achieve Goals: Fair ? ?  ?Frequency Min 2X/week ?  ? ? ?Co-evaluation   ?  ?  ?  ?  ? ? ?   ?AM-PAC PT "6 Clicks" Mobility  ?Outcome Measure Help needed turning from your back to your side while in a flat bed without using bedrails?: A Lot ?Help needed moving from lying on your back to sitting on the side of a flat bed without using bedrails?: A Lot ?Help needed moving to and from a bed to a chair (including a wheelchair)?: A Lot ?Help needed standing up from a chair using your arms (e.g., wheelchair or bedside chair)?: Total ?Help needed to walk in hospital room?: Total ?Help needed climbing 3-5 steps with a railing? : Total ?6 Click Score: 9 ? ?  ?End of Session   ?Activity Tolerance: Patient limited by fatigue ?Patient left: in bed;with call bell/phone within reach;with bed alarm set ?Nurse Communication: Mobility status ?PT Visit Diagnosis: Other abnormalities of gait and mobility (R26.89);Difficulty in walking, not elsewhere classified (R26.2);Muscle weakness (generalized) (M62.81);History of falling (Z91.81) ?  ? ?Time: 2297-9892 ?PT Time Calculation (min) (ACUTE ONLY): 18 min ? ? ?Charges:   PT Evaluation ?$PT Eval Moderate Complexity: 1 Mod ?  ?  ?   ? ? ?Rasa Degrazia P, PT ?Acute Rehabilitation Services ?Pager: [phone] ?Office: [phone] ? ? ?Fedora Knisely B Sanela Evola ?08/24/2021, 8:53 AM ? ?

## 2021-08-24 NOTE — Plan of Care (Signed)

## 2021-08-24 NOTE — Progress Notes (Signed)
Heart Failure Navigator Progress Note ? ?Assessed for Heart & Vascular TOC clinic readiness.  ?Patient does not meet criteria due to no benefits at this time. .  ? ? ? ?Earnestine Leys, BSN, RN ?Heart Failure Nurse Navigator ?Secure Chat Only   ?

## 2021-08-24 NOTE — Evaluation (Signed)
Occupational Therapy Evaluation ?Patient Details ?Name: Marissa Bowen ?MRN: [id number] ?DOB: 11/28/1926 ?Today's Date: 08/24/2021 ? ? ?History of Present Illness 88 y.o. female admitted 4/27 with fall, AMS, weakness and edema. Pt with acute on chronic CHf and Afib. PMhx: CAD s/p CABG, CVA, dementia, paroxysmal Afib on Eliquis, GIB, diverticulitis, IBS, migraines, GERD, cholecystectomy, HTN, anxiety  ? ?Clinical Impression ?  ?Patient admitted for above and presents with problem list below, including impaired cognition, generalized weakness, decreased activity tolerance and impaired balance. Pt with dementia at baseline, oriented to self only and requires increased time to follow simple one step command but inconsistent with poor awareness of deficits and safety.  She requires min assist to wash face supine, total assist for LB ADLs and max assist for bed mobility.  Sitting EOB presents with posterior lean and requires up to max assist to sit statically. Based on performance today, pt will benefit from further OT services acutely and after dc at SNF level for ADL and mobility progression to decrease burden of care.   ? ?BP supine 164/94 ?BP sitting 151/111 ?   ? ?Recommendations for follow up therapy are one component of a multi-disciplinary discharge planning process, led by the attending physician.  Recommendations may be updated based on patient status, additional functional criteria and insurance authorization.  ? ?Follow Up Recommendations ? Skilled nursing-short term rehab (<3 hours/day)  ?  ?Assistance Recommended at Discharge Frequent or constant Supervision/Assistance  ?Patient can return home with the following Two people to help with walking and/or transfers;Two people to help with bathing/dressing/bathroom;Assistance with cooking/housework;Assistance with feeding;Direct supervision/assist for medications management;Direct supervision/assist for financial management;Assist for transportation;Help with stairs  or ramp for entrance ? ?  ?Functional Status Assessment ? Patient has had a recent decline in their functional status and demonstrates the ability to make significant improvements in function in a reasonable and predictable amount of time.  ?Equipment Recommendations ? Other (comment) (defer)  ?  ?Recommendations for Other Services   ? ? ?  ?Precautions / Restrictions Precautions ?Precautions: Other (comment) ?Precaution Comments: watch HR, incontinent ?Restrictions ?Weight Bearing Restrictions: No  ? ?  ? ?Mobility Bed Mobility ?Overal bed mobility: Needs Assistance ?Bed Mobility: Sit to Supine, Supine to Sit ?  ?  ?Supine to sit: Max assist ?Sit to supine: Min assist ?  ?General bed mobility comments: max assist for LB and trunk support, scooting foward with poor intiation and task completion.  Returned to supine with min assist for trunk and pt bringing LEs to supine without assist. ?  ? ?Transfers ?  ?  ?  ?  ?  ?  ?  ?  ?  ?  ?  ? ?  ?Balance Overall balance assessment: Needs assistance ?Sitting-balance support: Bilateral upper extremity supported, Feet supported ?Sitting balance-Leahy Scale: Poor ?Sitting balance - Comments: posterior lean and requires external support for static sitting ?Postural control: Posterior lean ?  ?  ?  ?  ?  ?  ?  ?  ?  ?  ?  ?  ?  ?  ?   ? ?ADL either performed or assessed with clinical judgement  ? ?ADL Overall ADL's : Needs assistance/impaired ?  ?  ?Grooming: Wash/dry face;Minimal assistance;Bed level ?  ?  ?  ?  ?  ?Upper Body Dressing : Maximal assistance;Sitting ?  ?Lower Body Dressing: Total assistance;Sitting/lateral leans;Bed level ?  ?  ?Toilet Transfer Details (indicate cue type and reason): unable d/t safety ?  ?  ?  ?  ?  Functional mobility during ADLs: Moderate assistance;Maximal assistance (limited to EOB) ?General ADL Comments: posterior lean sitting EOB and would require +2 to attempt transfer  ? ? ? ?Vision   ?Vision Assessment?: No apparent visual deficits  ?    ?Perception   ?  ?Praxis   ?  ? ?Pertinent Vitals/Pain Pain Assessment ?Pain Assessment: No/denies pain  ? ? ? ?Hand Dominance   ?  ?Extremity/Trunk Assessment Upper Extremity Assessment ?Upper Extremity Assessment: Generalized weakness ?  ?Lower Extremity Assessment ?Lower Extremity Assessment: Defer to PT evaluation ?  ?Cervical / Trunk Assessment ?Cervical / Trunk Assessment: Kyphotic ?  ?Communication Communication ?Communication: No difficulties ?  ?Cognition Arousal/Alertness: Awake/alert ?Behavior During Therapy: Flat affect ?Overall Cognitive Status: Impaired/Different from baseline ?Area of Impairment: Orientation, Attention, Memory, Following commands, Safety/judgement, Problem solving, Awareness ?  ?  ?  ?  ?  ?  ?  ?  ?Orientation Level: Disoriented to, Time, Place, Situation ?Current Attention Level: Focused ?Memory: Decreased short-term memory ?Following Commands: Follows one step commands inconsistently, Follows one step commands with increased time ?Safety/Judgement: Decreased awareness of deficits, Decreased awareness of safety ?Awareness: Intellectual ?Problem Solving: Slow processing, Decreased initiation, Difficulty sequencing, Requires verbal cues, Requires tactile cues ?General Comments: oriented to self, follows simple commands with increased time ?  ?  ?General Comments  BP supine 164/94, EOB 151/111, supine after sitting 138/96 HR up to 130s with sitting EOB ? ?  ?Exercises   ?  ?Shoulder Instructions    ? ? ?Home Living Family/patient expects to be discharged to:: Assisted living ?  ?  ?  ?  ?  ?  ?  ?  ?  ?  ?  ?  ?  ?  ?Home Equipment: Conservation officer, nature (2 wheels) ?  ?Additional Comments: Resident at [facility] ALF- pt unable to provide PLOF and taken from last admission ?  ? ?  ?Prior Functioning/Environment Prior Level of Function : Needs assist ?  ?  ?  ?  ?  ?  ?Mobility Comments: Pt reports ambulatory with RW; suspect staff assists with mobility as needed ?ADLs Comments: Pt reports  meals provided, staff assist with bathing, dressing as needed ?  ? ?  ?  ?OT Problem List: Decreased strength;Decreased activity tolerance;Impaired balance (sitting and/or standing);Decreased cognition;Decreased safety awareness;Decreased knowledge of use of DME or AE;Decreased knowledge of precautions;Pain;Cardiopulmonary status limiting activity ?  ?   ?OT Treatment/Interventions: Self-care/ADL training;Therapeutic exercise;DME and/or AE instruction;Therapeutic activities;Balance training;Patient/family education  ?  ?OT Goals(Current goals can be found in the care plan section) Acute Rehab OT Goals ?Patient Stated Goal: none stated ?OT Goal Formulation: Patient unable to participate in goal setting ?Time For Goal Achievement: 09/07/21 ?Potential to Achieve Goals: Good  ?OT Frequency: Min 2X/week ?  ? ?Co-evaluation   ?  ?  ?  ?  ? ?  ?AM-PAC OT "6 Clicks" Daily Activity     ?Outcome Measure Help from another person eating meals?: A Lot ?Help from another person taking care of personal grooming?: A Lot ?Help from another person toileting, which includes using toliet, bedpan, or urinal?: Total ?Help from another person bathing (including washing, rinsing, drying)?: A Lot ?Help from another person to put on and taking off regular upper body clothing?: A Lot ?Help from another person to put on and taking off regular lower body clothing?: Total ?6 Click Score: 10 ?  ?End of Session Nurse Communication: Mobility status;Precautions;Other (comment) (BP, HR) ? ?Activity Tolerance: Patient tolerated treatment well ?Patient left:  in bed;with call bell/phone within reach;with bed alarm set ? ?OT Visit Diagnosis: Other abnormalities of gait and mobility (R26.89);Muscle weakness (generalized) (M62.81);Other symptoms and signs involving cognitive function;History of falling (Z91.81)  ?              ?Time: 6116-4353 ?OT Time Calculation (min): 21 min ?Charges:  OT General Charges ?$OT Visit: 1 Visit ?OT Evaluation ?$OT Eval  Moderate Complexity: 1 Mod ? ?Jolaine Artist, OT ?Acute Rehabilitation Services ?Pager [phone] ?Office [phone] ? ? ?Delight Stare ?08/24/2021, 10:35 AM ?

## 2021-08-24 NOTE — TOC Initial Note (Signed)
Transition of Care (TOC) - Initial/Assessment Note  ? ? ?Patient Details  ?Name: Marissa Bowen ?MRN: [id number] ?Date of Birth: [birth year] ? ?Transition of Care (TOC) CM/SW Contact:    ?[name], LCSWA ?Phone Number: ?08/24/2021, 1:58 PM ? ?Clinical Narrative:                 ? ?CSW received consult for possible SNF placement at time of discharge. Due to patients current orientation CSW spoke with patients daughter [name].CSW spoke with patients daughter regarding PT recommendation of SNF placement at time of discharge.Patients daughter reports patient comes from [facility].  Patients daughter  expressed understanding of PT recommendation and confirmed she would like for CSW to call [name] to see if patient is able to return with [facility] orders for Pt/OT when medically ready for dc. CSW called [facility] and LVM. CSW awaiting callback.  CSW to continue to follow and assist with discharge planning needs.  ?  ?Barriers to Discharge: Continued Medical Work up ? ? ?Patient Goals and CMS Choice ?  ?CMS Medicare.gov Compare Post Acute Care list provided to:: Patient Represenative (must comment) (Patients daughter [name]) ?  ? ?Expected Discharge Plan and Services ?  ?In-house Referral: Clinical Social Work ?  ?  ?Living arrangements for the past 2 months: [facility]) ?                ?  ?  ?  ?  ?  ?  ?  ?  ?  ?  ? ?Prior Living Arrangements/Services ?Living arrangements for the past 2 months: [facility]) ?  ?Patient language and need for interpreter reviewed:: Yes ?Do you feel safe going back to the place where you live?: Yes      ?Need for Family Participation in Patient Care: Yes (Comment) ?Care giver support system in place?: Yes (comment) ?  ?Criminal Activity/Legal Involvement Pertinent to Current Situation/Hospitalization: No - Comment as needed ? ?Activities of Daily Living ?  ?  ? ?Permission Sought/Granted ?Permission  sought to share information with : Case Manager, Family Supports, Customer service manager ?Permission granted to share information with : No ? Share Information with NAME: Due to patients current orientation CSW spoke with patients daughter [name] ? Permission granted to share info w AGENCY: Due to patients current orientation CSW spoke with patients daughter [name] ? Permission granted to share info w Relationship: Due to patients current orientation CSW spoke with patients daughter [name] ? Permission granted to share info w Contact Information: Due to patients current orientation CSW spoke with patients daughter [name] ? ?Emotional Assessment ?  ?  ?  ?Orientation: : Fluctuating Orientation (Suspected and/or reported Sundowners) (Confused currently) ?Alcohol / Substance Use: Not Applicable ?Psych Involvement: No (comment) ? ?Admission diagnosis:  Transient alteration of awareness [R40.4] ?Shortness of breath [R06.02] ?Tachycardia [R00.0] ?Acute exacerbation of congestive heart failure ([place]) [I50.9] ?Hypervolemia, unspecified hypervolemia type [E87.70] ?Patient Active Problem List  ? Diagnosis Date Noted  ? Acute exacerbation of congestive heart failure ([place]) 08/23/2021  ? Dementia with behavioral disturbance ([place]) 04/22/2021  ? Acute metabolic encephalopathy 53/61/4431  ? Hallucinations 03/11/2017  ? Essential tremor 03/11/2017  ? Prolonged Q-T interval on ECG 02/28/2017  ? Recurrent falls 02/27/2017  ? GAD (generalized anxiety disorder) 11/19/2016  ? Macular degeneration of both eyes 09/22/2015  ? Carotid stenosis 08/10/2015  ? On amiodarone therapy 08/10/2015  ? Chronic cholecystitis with calculus 01/03/2015  ? Allergic rhinitis 08/05/2014  ?  Essential hypertension 05/03/2014  ? Osteoarthritis 09/29/2013  ? Routine general medical examination at a health care facility 09/29/2013  ? Depression with anxiety 08/03/2013  ? Atrial fibrillation ([place]) 09/02/2012  ? Anemia due to chronic blood loss 08/18/2012   ? Diarrhea 06/08/2012  ? Postural hypotension 06/08/2012  ? ACUTE POSTHEMORRHAGIC ANEMIA 10/23/2009  ? BACK PAIN, LUMBAR 09/27/2009  ? MUSCLE WEAKNESS (GENERALIZED) 09/27/2009  ? IBS 12/07/2008  ? Weakness 07/21/2008  ? MIGRAINE WITH AURA 08/13/2007  ? OSTEOARTHRITIS 08/13/2007  ? Hyperlipidemia LDL goal <100 11/25/2006  ? CAD (coronary artery disease) 11/25/2006  ? GERD 11/25/2006  ? DIVERTICULITIS, HX OF 11/25/2006  ? ?PCP:  System, Provider Not In ?Pharmacy:   ?[place], [place] - [address] ?[address] [place] [address] ?Phone: [phone] Fax: [phone] ? ? ? ? ?Social Determinants of Health (SDOH) Interventions ?  ? ?Readmission Risk Interventions ?   ? View : No data to display.  ?  ?  ?  ? ? ? ?

## 2021-08-24 NOTE — Progress Notes (Signed)
?   08/24/21 0005  ?Assess: MEWS Score  ?Temp 97.7 ?F (36.5 ?C)  ?BP (!) 152/93  ?Pulse Rate 91  ?ECG Heart Rate (!) 113  ?Resp 17  ?Level of Consciousness Alert  ?SpO2 96 %  ?O2 Device Room Air  ?Assess: MEWS Score  ?MEWS Temp 0  ?MEWS Systolic 0  ?MEWS Pulse 2  ?MEWS RR 0  ?MEWS LOC 0  ?MEWS Score 2  ?MEWS Score Color Yellow  ?Assess: if the MEWS score is Yellow or Red  ?Were vital signs taken at a resting state? Yes  ?Focused Assessment No change from prior assessment  ?Early Detection of Sepsis Score *See Row Information* Low  ?MEWS guidelines implemented *See Row Information* Yes  ?Treat  ?MEWS Interventions Escalated (See documentation below)  ?Pain Scale 0-10  ?Pain Score 0  ?Patients Stated Pain Goal 0  ?Breathing 1  ?Negative Vocalization 0  ?Facial Expression 0  ?Body Language 0  ?Consolability 0  ?PAINAD Score 1  ?Take Vital Signs  ?Increase Vital Sign Frequency  Yellow: Q 2hr X 2 then Q 4hr X 2, if remains yellow, continue Q 4hrs  ?Escalate  ?MEWS: Escalate Yellow: discuss with charge nurse/RN and consider discussing with provider and RRT  ?Notify: Charge Nurse/RN  ?Name of Charge Nurse/RN Notified Marcelyn Ditty, RN  ?Date Charge Nurse/RN Notified 08/24/21  ?Time Charge Nurse/RN Notified 0008  ?Document  ?Patient Outcome Stabilized after interventions  ?Progress note created (see row info) Yes  ? ? ?

## 2021-08-25 DIAGNOSIS — I5033 Acute on chronic diastolic (congestive) heart failure: Secondary | ICD-10-CM | POA: Diagnosis not present

## 2021-08-25 DIAGNOSIS — G9340 Encephalopathy, unspecified: Secondary | ICD-10-CM

## 2021-08-25 LAB — BASIC METABOLIC PANEL
Anion gap: 19 — ABNORMAL HIGH (ref 5–15)
Anion gap: 9 (ref 5–15)
Anion gap: 13 (ref 5–15)
BUN: 17 mg/dL (ref 8–23)
BUN: 15 mg/dL (ref 8–23)
BUN: 17 mg/dL (ref 8–23)
CO2: 21 mmol/L — ABNORMAL LOW (ref 22–32)
CO2: 28 mmol/L (ref 22–32)
CO2: 27 mmol/L (ref 22–32)
Calcium: 9.4 mg/dL (ref 8.9–10.3)
Calcium: 9 mg/dL (ref 8.9–10.3)
Calcium: 9.3 mg/dL (ref 8.9–10.3)
Chloride: 92 mmol/L — ABNORMAL LOW (ref 98–111)
Chloride: 95 mmol/L — ABNORMAL LOW (ref 98–111)
Chloride: 92 mmol/L — ABNORMAL LOW (ref 98–111)
Creatinine, Ser: 0.92 mg/dL (ref 0.44–1.00)
Creatinine, Ser: 0.61 mg/dL (ref 0.44–1.00)
Creatinine, Ser: 0.66 mg/dL (ref 0.44–1.00)
GFR, Estimated: 58 mL/min — ABNORMAL LOW (ref >60)
GFR, Estimated: >60 mL/min (ref >60)
GFR, Estimated: >60 mL/min (ref >60)
Glucose, Bld: 151 mg/dL — ABNORMAL HIGH (ref 70–99)
Glucose, Bld: 111 mg/dL — ABNORMAL HIGH (ref 70–99)
Glucose, Bld: 110 mg/dL — ABNORMAL HIGH (ref 70–99)
Potassium: 3.6 mmol/L (ref 3.5–5.1)
Potassium: 3.6 mmol/L (ref 3.5–5.1)
Potassium: 3.7 mmol/L (ref 3.5–5.1)
Sodium: 132 mmol/L — ABNORMAL LOW (ref 135–145)
Sodium: 132 mmol/L — ABNORMAL LOW (ref 135–145)
Sodium: 132 mmol/L — ABNORMAL LOW (ref 135–145)

## 2021-08-25 LAB — URINE CULTURE

## 2021-08-25 LAB — URINALYSIS, ROUTINE W REFLEX MICROSCOPIC
Bacteria, UA: NONE SEEN
Bilirubin Urine: NEGATIVE
Glucose, UA: NEGATIVE mg/dL
Hgb urine dipstick: NEGATIVE
Ketones, ur: 20 mg/dL — AB
Leukocytes,Ua: NEGATIVE
Nitrite: NEGATIVE
Protein, ur: >=300 mg/dL — AB
Specific Gravity, Urine: 1.022 (ref 1.005–1.030)
pH: 6 (ref 5.0–8.0)

## 2021-08-25 LAB — CBC
HCT: 46.8 % — ABNORMAL HIGH (ref 36.0–46.0)
Hemoglobin: 14.7 g/dL (ref 12.0–15.0)
MCH: 28.8 pg (ref 26.0–34.0)
MCHC: 31.4 g/dL (ref 30.0–36.0)
MCV: 91.6 fL (ref 80.0–100.0)
Platelets: 247 10*3/uL (ref 150–400)
RBC: 5.11 MIL/uL (ref 3.87–5.11)
RDW: 15.8 % — ABNORMAL HIGH (ref 11.5–15.5)
WBC: 15.5 10*3/uL — ABNORMAL HIGH (ref 4.0–10.5)
nRBC: 0 % (ref 0.0–0.2)

## 2021-08-25 LAB — LACTIC ACID, PLASMA: Lactic Acid, Venous: 1.7 mmol/L (ref 0.5–1.9)

## 2021-08-25 LAB — GLUCOSE, CAPILLARY
Glucose-Capillary: 122 mg/dL — ABNORMAL HIGH (ref 70–99)
Glucose-Capillary: 128 mg/dL — ABNORMAL HIGH (ref 70–99)

## 2021-08-25 LAB — BETA-HYDROXYBUTYRIC ACID: Beta-Hydroxybutyric Acid: 0.57 mmol/L — ABNORMAL HIGH (ref 0.05–0.27)

## 2021-08-25 MED ORDER — METOPROLOL TARTRATE 100 MG PO TABS
100.0000 mg | ORAL_TABLET | Freq: Two times a day (BID) | ORAL | Status: DC
  Administered 2021-08-25 (×2): 100 mg via ORAL
  Filled 2021-08-25 (×2): qty 1

## 2021-08-25 MED ORDER — DEXTROSE-NACL 5-0.45 % IV SOLN: INTRAVENOUS | Status: DC

## 2021-08-25 MED ORDER — THIAMINE HCL 100 MG/ML IJ SOLN
500.0000 mg | Freq: Three times a day (TID) | INTRAVENOUS | Status: DC
  Administered 2021-08-25 – 2021-08-26 (×2): 500 mg via INTRAVENOUS
  Filled 2021-08-25 (×5): qty 5

## 2021-08-25 NOTE — Progress Notes (Signed)
Per IV consult, pt pleasantly confused, removing own PIV.  No IV meds ordered.  Secure chat with Dr Collene Gobble, okay to leave IV out. ?

## 2021-08-25 NOTE — Plan of Care (Signed)
?  Problem: Cardiac: ?Goal: Ability to achieve and maintain adequate cardiopulmonary perfusion will improve ?Outcome: Progressing ?  ?Problem: Clinical Measurements: ?Goal: Ability to maintain clinical measurements within normal limits will improve ?Outcome: Progressing ?  ?Problem: Clinical Measurements: ?Goal: Respiratory complications will improve ?Outcome: Progressing ?  ?Problem: Nutrition: ?Goal: Adequate nutrition will be maintained ?Outcome: Progressing ?  ?Problem: Activity: ?Goal: Risk for activity intolerance will decrease ?Outcome: Progressing ?  ?Problem: Safety: ?Goal: Ability to remain free from injury will improve ?Outcome: Progressing ?  ?Problem: Skin Integrity: ?Goal: Risk for impaired skin integrity will decrease ?Outcome: Progressing ?  ?Problem: Coping: ?Goal: Level of anxiety will decrease ?Outcome: Progressing ?  ?

## 2021-08-25 NOTE — Discharge Instructions (Signed)

## 2021-08-25 NOTE — Progress Notes (Signed)
? ?Subjective:  ? ?No acute events overnight. Patient not responding clearly, but appears to be in no acute distress. ? ?Objective: ? ?Vital signs in last 24 hours: ?Vitals:  ? 08/25/21 0037 08/25/21 0444 08/25/21 0445 08/25/21 0833  ?BP: 124/83 122/83 122/83 (!) 150/113  ?Pulse: 88  88 (!) 109  ?Resp: '15 18 18 17  '$ ?Temp: 98.5 ?F (36.9 ?C) 98.1 ?F (36.7 ?C) 98.1 ?F (36.7 ?C) 98.1 ?F (36.7 ?C)  ?TempSrc: Oral Oral  Oral  ?SpO2: 93% 94%    ?Weight:  71.7 kg    ?Height:      ? ?General: Elderly appearing, laying in bed in no acute distress ?CV: Tachycardic, irregular rhythm. No murmurs appreciated. ?Pulm: Normal respiratory effort on room air. Clear to ausculation from anterior lung fields. ?MSK: Normal bulk, tone. No pitting edema bilateral lower extremities. ?Neuro: Awake, alert. Incoherent speech, eyes track appropriately. Moving all four extremities without difficulty. No facial droop appreciated.  ? ?Assessment/Plan: ? ?Marissa Bowen is 88yo person with vascular dementia, heart failure with preserved ejection fraction, permanent atrial fibrillation admitted 4/27 with acute on chronic heart failure and acute encephalopathy.  ? ?Principal Problem: ?  Acute exacerbation of congestive heart failure ([place]) ?Active Problems: ?  Atrial fibrillation ([place]) ?  Dementia with behavioral disturbance ([place]) ? ?#Acute encephalopathy of unknown origin ?#History of vascular dementia ?This morning on rounds, patient's mental status appeared to be worsening from yesterday. Per family, she was able to have a conversation and converse appropriately just a few days ago. She continues to be tachycardic, afebrile, normotensive. On lab work this morning, found to have worsening WBC. In addition, new anion gap metabolic acidosis was identified, although repeat BMP later today showed no signs of acidosis. No electrolyte imbalance on labs. No focal neurological deficit on exam. We have held her centrally-acting medications since admission.  Beta-hydroxybutyric acid mildly elevated today, concern for possible starvation ketosis given recent poor po intake. Possible patient has underlying infection driving her worsening encephalopathy, although UA w/o signs of infection and no respiratory symptoms. TSH normal on arrival. No known history of alcohol or recreational drug use. CT head on arrival without acute changes. Also would not expect these acute changes in mental status with just progressive dementia. Will also check B12, CBC w/ diff, and CMP tomorrow morning for further evaluation. If mental status has not improved over next 24h, can consider repeat brain imaging.  ?- Delirium precautions in place ?- CMP, CBC w/ diff, B12 in AM ?- Consider r/p imaging if no improvement ? ?#Starvation ketosis ?#Hyponatremia ?Today found to have elevated beta hydroxybutyric acid along with ketones in urine. Per family, patient has barely eaten in days. Will plan to gently hydrate with dextrose this evening. Will need to be cautious given hyponatremia, will monitor labs.  ?- D5-1/2NS 75cc/hr ?- BMP q8h, adjust fluids as necessary ? ?#Acute on chronic heart failure ?Today patient appears euvolemic, weight is appropriately dropping. Will continue to monitor volume status given we are gently hydrating this evening.  ?- Hold further diuretics ?- Daily weights ?- Strict I/O ? ?#Permanent atrial fibrillation with RVR ?Patient continues to have intermittent tachycardia to 130's. We will increase metoprolol dose again today. Given no change in HR with increased dose, could signal an underlying pathology, such as infection, driving this.   ?- Increase metoprolol tartrate '100mg'$  twice daily ?- Amiodarone '100mg'$  daily ?- Apixaban '5mg'$  twice daily ? ?DIET: HH ?IVF: D5-1/2NS ?DVT PPX: Eliquis ?BOWEL: Senokot-S ?CODE: FULL ?FAM COM:  Discussed with daughter and son at bedside today ? ?Prior to Admission Living Arrangement: [facility] ?Anticipated Discharge Location: Pending - need to see  level of care memory unit can handle ?Barriers to Discharge: medical management ?Dispo: Anticipated discharge in approximately >2 day(s).  ? Sanjuan Dame, MD ?08/25/2021, 3:22 PM ?Pager: [phone] ?After 5pm on weekdays and 1pm on weekends: On Call pager [phone] ? ? ?

## 2021-08-26 ENCOUNTER — Encounter (HOSPITAL_COMMUNITY): Payer: Self-pay | Admitting: Student in an Organized Health Care Education/Training Program

## 2021-08-26 ENCOUNTER — Inpatient Hospital Stay (HOSPITAL_COMMUNITY): Payer: Medicare Other

## 2021-08-26 LAB — CBC WITH DIFFERENTIAL/PLATELET
Abs Immature Granulocytes: 0.04 10*3/uL (ref 0.00–0.07)
Basophils Absolute: 0.1 10*3/uL (ref 0.0–0.1)
Basophils Relative: 1 %
Eosinophils Absolute: 0.2 10*3/uL (ref 0.0–0.5)
Eosinophils Relative: 2 %
HCT: 41.8 % (ref 36.0–46.0)
Hemoglobin: 13.9 g/dL (ref 12.0–15.0)
Immature Granulocytes: 0 %
Lymphocytes Relative: 14 %
Lymphs Abs: 1.5 10*3/uL (ref 0.7–4.0)
MCH: 29.5 pg (ref 26.0–34.0)
MCHC: 33.3 g/dL (ref 30.0–36.0)
MCV: 88.7 fL (ref 80.0–100.0)
Monocytes Absolute: 1.6 10*3/uL — ABNORMAL HIGH (ref 0.1–1.0)
Monocytes Relative: 14 %
Neutro Abs: 7.6 10*3/uL (ref 1.7–7.7)
Neutrophils Relative %: 69 %
Platelets: 247 10*3/uL (ref 150–400)
RBC: 4.71 MIL/uL (ref 3.87–5.11)
RDW: 15.4 % (ref 11.5–15.5)
WBC: 10.9 10*3/uL — ABNORMAL HIGH (ref 4.0–10.5)
nRBC: 0 % (ref 0.0–0.2)

## 2021-08-26 LAB — COMPREHENSIVE METABOLIC PANEL
ALT: 17 U/L (ref 0–44)
AST: 24 U/L (ref 15–41)
Albumin: 3.2 g/dL — ABNORMAL LOW (ref 3.5–5.0)
Alkaline Phosphatase: 55 U/L (ref 38–126)
Anion gap: 10 (ref 5–15)
BUN: 17 mg/dL (ref 8–23)
CO2: 26 mmol/L (ref 22–32)
Calcium: 8.9 mg/dL (ref 8.9–10.3)
Chloride: 95 mmol/L — ABNORMAL LOW (ref 98–111)
Creatinine, Ser: 0.62 mg/dL (ref 0.44–1.00)
GFR, Estimated: >60 mL/min (ref >60)
Glucose, Bld: 131 mg/dL — ABNORMAL HIGH (ref 70–99)
Potassium: 3.7 mmol/L (ref 3.5–5.1)
Sodium: 131 mmol/L — ABNORMAL LOW (ref 135–145)
Total Bilirubin: 1 mg/dL (ref 0.3–1.2)
Total Protein: 6.7 g/dL (ref 6.5–8.1)

## 2021-08-26 LAB — GLUCOSE, CAPILLARY
Glucose-Capillary: 149 mg/dL — ABNORMAL HIGH (ref 70–99)
Glucose-Capillary: 128 mg/dL — ABNORMAL HIGH (ref 70–99)
Glucose-Capillary: 113 mg/dL — ABNORMAL HIGH (ref 70–99)
Glucose-Capillary: 107 mg/dL — ABNORMAL HIGH (ref 70–99)
Glucose-Capillary: 111 mg/dL — ABNORMAL HIGH (ref 70–99)

## 2021-08-26 LAB — BASIC METABOLIC PANEL
Anion gap: 10 (ref 5–15)
Anion gap: 12 (ref 5–15)
BUN: 15 mg/dL (ref 8–23)
BUN: 17 mg/dL (ref 8–23)
CO2: 27 mmol/L (ref 22–32)
CO2: 25 mmol/L (ref 22–32)
Calcium: 8.7 mg/dL — ABNORMAL LOW (ref 8.9–10.3)
Calcium: 8.8 mg/dL — ABNORMAL LOW (ref 8.9–10.3)
Chloride: 94 mmol/L — ABNORMAL LOW (ref 98–111)
Chloride: 94 mmol/L — ABNORMAL LOW (ref 98–111)
Creatinine, Ser: 0.67 mg/dL (ref 0.44–1.00)
Creatinine, Ser: 0.68 mg/dL (ref 0.44–1.00)
GFR, Estimated: >60 mL/min (ref >60)
GFR, Estimated: >60 mL/min (ref >60)
Glucose, Bld: 104 mg/dL — ABNORMAL HIGH (ref 70–99)
Glucose, Bld: 100 mg/dL — ABNORMAL HIGH (ref 70–99)
Potassium: 3.5 mmol/L (ref 3.5–5.1)
Potassium: 4 mmol/L (ref 3.5–5.1)
Sodium: 131 mmol/L — ABNORMAL LOW (ref 135–145)
Sodium: 131 mmol/L — ABNORMAL LOW (ref 135–145)

## 2021-08-26 LAB — VITAMIN B12: Vitamin B-12: 814 pg/mL (ref 180–914)

## 2021-08-26 LAB — MAGNESIUM: Magnesium: 2 mg/dL (ref 1.7–2.4)

## 2021-08-26 MED ORDER — METOPROLOL TARTRATE 5 MG/5ML IV SOLN
2.5000 mg | Freq: Three times a day (TID) | INTRAVENOUS | Status: DC
  Administered 2021-08-26 – 2021-08-27 (×3): 2.5 mg via INTRAVENOUS
  Filled 2021-08-26 (×3): qty 5

## 2021-08-26 MED ORDER — POTASSIUM CHLORIDE 10 MEQ/100ML IV SOLN
10.0000 meq | INTRAVENOUS | Status: AC
  Administered 2021-08-26: 10 meq via INTRAVENOUS
  Filled 2021-08-26: qty 100

## 2021-08-26 MED ORDER — POTASSIUM CHLORIDE 20 MEQ PO PACK
40.0000 meq | PACK | Freq: Once | ORAL | Status: DC
Start: 2021-08-26 — End: 2021-08-26
  Filled 2021-08-26: qty 2

## 2021-08-26 MED ORDER — VALPROATE SODIUM 100 MG/ML IV SOLN
250.0000 mg | Freq: Two times a day (BID) | INTRAVENOUS | Status: DC
  Administered 2021-08-26 – 2021-08-28 (×5): 250 mg via INTRAVENOUS
  Filled 2021-08-26 (×6): qty 2.5

## 2021-08-26 MED ORDER — POTASSIUM CHLORIDE 10 MEQ/100ML IV SOLN
10.0000 meq | INTRAVENOUS | Status: AC
  Administered 2021-08-26 (×2): 10 meq via INTRAVENOUS
  Filled 2021-08-26 (×2): qty 100

## 2021-08-26 MED ORDER — ENOXAPARIN SODIUM 80 MG/0.8ML IJ SOSY
70.0000 mg | PREFILLED_SYRINGE | Freq: Two times a day (BID) | INTRAMUSCULAR | Status: DC
  Administered 2021-08-26 – 2021-08-28 (×5): 70 mg via SUBCUTANEOUS
  Filled 2021-08-26 (×5): qty 0.8

## 2021-08-26 NOTE — Plan of Care (Signed)

## 2021-08-26 NOTE — Evaluation (Signed)
Clinical/Bedside Swallow Evaluation ?Patient Details  ?Name: Marissa Bowen ?MRN: [id number] ?Date of Birth: 1926-06-27 ? ?Today's Date: 08/26/2021 ?Time: SLP Start Time (ACUTE ONLY): 0930 SLP Stop Time (ACUTE ONLY): 4098 ?SLP Time Calculation (min) (ACUTE ONLY): 11 min ? ?Past Medical History:  ?Past Medical History:  ?Diagnosis Date  ? Anemia   ? Anisocoria 02/27/2017  ? Sees Dr Rutherford Guys  ? Anxiety   ? BACK PAIN, LUMBAR 09/27/2009  ? CAD (coronary artery disease)   ? a. s/p remote CABG 1997, 3V.;  b. LexiScan Myoview (8/15): No ischemia, EF 72%, normal study;  c. Echo 12/13 - EF 60-65%, no significant valvular abnormalities, normal RV size and systolic function.    ? CVA (cerebral infarction)   ? a. Thalamic infarct 09/2009. //  b. hx of TIA in 2008  ? Gastric ulcer   ? GERD 11/25/2006  ? GI bleed   ? a. 09/2009: secondary to antral ulcer.  ? Hiatal hernia   ? History of diverticulitis Dx 02/2012  ? History of MI (myocardial infarction)   ? HYPERLIPIDEMIA   ? HYPERTENSION   ? IBS 12/07/2008  ? MIGRAINE WITH AURA 08/13/2007  ? Mild carotid artery disease ([place])   ? a. 0-39% bilat 05/2011 (f/u recommended 05/2013). //  b. Carotid US 3/17 - Bilat ICA 1-39% >> FU prn  ? Muscle weakness (generalized) 09/27/2009  ? OSTEOARTHRITIS 08/13/2007  ? PAF (paroxysmal atrial fibrillation) ([place]) 11/25/2006  ? a. Multaq >> changes to Amiodarone 2/2 $$  //  Eliquis (CHADS2-VASc = 6)  ? Urinary, incontinence, stress female   ? wears Pessary  ? ?Past Surgical History:  ?Past Surgical History:  ?Procedure Laterality Date  ? ABDOMINAL HYSTERECTOMY  1973  ? CATARACT EXTRACTION W/ INTRAOCULAR LENS  IMPLANT, BILATERAL Bilateral 1999  ? CHOLECYSTECTOMY N/A 01/03/2015  ? Procedure: LAPAROSCOPIC CHOLECYSTECTOMY WITH INTRAOPERATIVE CHOLANGIOGRAM;  Surgeon: Donnie Mesa, MD;  Location: [facility];  Service: General;  Laterality: N/A;  ? [place]  ? Dr Lia Foyer  ? CORONARY ARTERY BYPASS GRAFT  1997  ? Dr Melvenia Needles  ? LAPAROSCOPIC CHOLECYSTECTOMY   01/03/2015  ? TONSILLECTOMY    ? ?HPI:  ?[name] is 88yo person with vascular dementia, heart failure with preserved ejection fraction, permanent atrial fibrillation admitted 4/27 with acute on chronic heart failure and acute encephalopathy. H/o vascular dementia. With new changes, poorly responsive, non verbal 4/30. Swallow evaluation ordered.  ?  ?Assessment / Plan / Recommendation  ?Clinical Impression ? Bedside swallow evaluation complete. Patient lethargic, sleeping soundly upon arrival to room. Aroused relatively easily with loud verbal cueing however quickly falls back to sleep, within 15 seconds, without cueing. No obvious focal neuro deficits noted although patient is non-verbal, does not follow commands. Decreased labial seal and oral manipulation of bolus noted despite orally accepting bolus when aroused. Was able to eventually elicit a pharyngeal swalllow with both ice chips and small bite of pureed solids without immediate indication of aspiration but with delayed cough. Per RN, patient will be taken for head CT to evaluate for possible new CVA. At this time, patient not judged appropriate for pos given current medical condition. Will plan to f/u next date. ?SLP Visit Diagnosis: Dysphagia, unspecified (R13.10) ?   ?Aspiration Risk ? Severe aspiration risk  ?  ?Diet Recommendation NPO  ? ?Medication Administration: Via alternative means  ?  ?Other  Recommendations Oral Care Recommendations: Oral care QID   ? ?Recommendations for follow up therapy are one component of  a multi-disciplinary discharge planning process, led by the attending physician.  Recommendations may be updated based on patient status, additional functional criteria and insurance authorization. ? ?Follow up Recommendations  (TBD)  ? ? ?  ?   ?   ?Frequency and Duration min 2x/week  ?2 weeks ?  ?   ? ?Prognosis Prognosis for Safe Diet Advancement: Fair  ? ?  ? ?Swallow Study   ?General HPI: Laneah Luft is 88yo person with vascular  dementia, heart failure with preserved ejection fraction, permanent atrial fibrillation admitted 4/27 with acute on chronic heart failure and acute encephalopathy. H/o vascular dementia. With new changes, poorly responsive, non verbal 4/30. Swallow evaluation ordered. ?Type of Study: Bedside Swallow Evaluation ?Diet Prior to this Study: Dysphagia 3 (soft);Thin liquids ?Temperature Spikes Noted: No ?Respiratory Status: Room air ?History of Recent Intubation: No ?Behavior/Cognition: Lethargic/Drowsy;Doesn't follow directions ?Oral Cavity Assessment:  (mild lingual residue from am meal) ?Oral Cavity - Dentition: Adequate natural dentition ?Self-Feeding Abilities: Total assist ?Patient Positioning: Upright in bed ?Baseline Vocal Quality:  (non verbal) ?Volitional Cough: Cognitively unable to elicit ?Volitional Swallow: Unable to elicit  ?  ?Oral/Motor/Sensory Function Overall Oral Motor/Sensory Function:  (unable to participate in formal eval, no assymetry noted. Decreased labial seal during pos)   ?Ice Chips Ice chips: Impaired ?Presentation: Spoon ?Oral Phase Impairments: Reduced labial seal;Impaired mastication;Poor awareness of bolus ?Oral Phase Functional Implications: Prolonged oral transit;Oral holding   ?Thin Liquid Thin Liquid: Not tested  ?  ?Nectar Thick Nectar Thick Liquid: Not tested   ?Honey Thick Honey Thick Liquid: Not tested   ?Puree Puree: Impaired ?Presentation: Spoon ?Oral Phase Impairments: Reduced labial seal;Impaired mastication;Poor awareness of bolus ?Oral Phase Functional Implications: Oral holding;Prolonged oral transit ?Pharyngeal Phase Impairments: Cough - Delayed   ?Solid ? ? ?  Solid: Not tested  ? ?  ?Lagena Strand MA, CCC-SLP ? ?Alroy Portela Meryl ?08/26/2021,9:57 AM ? ? ? ?

## 2021-08-26 NOTE — Progress Notes (Signed)
I notified Dr. Howie Ill that patients heart rate will go up to upper 140s and back to the 90s and the frequency had increased. Plan to give IV lopressor. Will continue to monitor ? ?

## 2021-08-26 NOTE — Progress Notes (Signed)
Speech therapy at bedside and deemed it unsafe at this point to proceed with medications in applesauce nd recommended NPO. I notified MD that I was unable to given PO medications and she was without meds like amiodarone and metoprolol and eliquis. MD stated he would address these issues. I also notified patients daughter as she is not able to visit patient today and has asked for updates. Will closely monitor ? ?

## 2021-08-26 NOTE — Progress Notes (Signed)
ANTICOAGULATION CONSULT NOTE - Initial Consult ? ?Pharmacy Consult for Lovenox ?Indication: atrial fibrillation ? ?Allergies  ?Allergen Reactions  ? Asa [Aspirin] Other (See Comments)  ?  Bleeding ulcer ?Per MAR  ? Crestor [Rosuvastatin Calcium] Other (See Comments)  ?  Myalgia ?Per MAR  ? Hydrocodone Other (See Comments)  ?  Migraines ?Per MAR  ? Ibuprofen Other (See Comments)  ?  Ulcers ?Per MAR  ? Oxycodone Hcl Other (See Comments)  ?   Migraines ?Per MAR  ? Penicillins Swelling and Rash  ?  Per MAR ?Has patient had a PCN reaction causing immediate rash, facial/tongue/throat swelling, SOB or lightheadedness with hypotension: Yes ?Has patient had a PCN reaction causing severe rash involving mucus membranes or skin necrosis: No ?Has patient had a PCN reaction that required hospitalization No ?Has patient had a PCN reaction occurring within the last 10 years: No ?If all of the above answers are "NO", then may proceed with Cephalosporin use. ?  ? Prednisone Other (See Comments)  ?  Ulcers ?Per MAR  ? Statins Other (See Comments)  ?  Aches and pains ?Per MAR  ? Morphine And Related Other (See Comments)  ?   Had hallucinations with morphine sulfate in the past   ?Per MAR  ? Pristiq [Desvenlafaxine] Other (See Comments)  ?  Drowsiness and hangover sensation ?Per MAR ?  ? ? ?Patient Measurements: ?Height: 5' 7.5" (171.5 cm) ?Weight: 70.4 kg (155 lb 3.3 oz) ?IBW/kg (Calculated) : 62.75 ?Heparin Dosing Weight:  ? ?Vital Signs: ?Temp: 98.2 ?F (36.8 ?C) (04/30 1018) ?Temp Source: Oral (04/30 0809) ?BP: 130/73 (04/30 1018) ?Pulse Rate: 89 (04/30 1018) ? ?Labs: ?Recent Labs  ?  08/23/21 ?1604 08/23/21 ?1712 08/24/21 ?6063 08/25/21 ?0228 08/25/21 ?1315 08/25/21 ?1957 08/26/21 ?0356  ?HGB 12.7  --  12.8 14.7  --   --  13.9  ?HCT 41.0  --  38.4 46.8*  --   --  41.8  ?PLT 200  --  209 247  --   --  247  ?LABPROT 16.7*  --   --   --   --   --   --   ?INR 1.4*  --   --   --   --   --   --   ?CREATININE 0.72  --  0.70 0.92 0.61 0.66  0.62  ?TROPONINIHS 19* 18*  --   --   --   --   --   ? ? ?Estimated Creatinine Clearance: 44.5 mL/min (by C-G formula based on SCr of 0.62 mg/dL). ? ? ?Medical History: ?Past Medical History:  ?Diagnosis Date  ? Anemia   ? Anisocoria 02/27/2017  ? Sees Dr Rutherford Guys  ? Anxiety   ? BACK PAIN, LUMBAR 09/27/2009  ? CAD (coronary artery disease)   ? a. s/p remote CABG 1997, 3V.;  b. LexiScan Myoview (8/15): No ischemia, EF 72%, normal study;  c. Echo 12/13 - EF 60-65%, no significant valvular abnormalities, normal RV size and systolic function.    ? CVA (cerebral infarction)   ? a. Thalamic infarct 09/2009. //  b. hx of TIA in 2008  ? Gastric ulcer   ? GERD 11/25/2006  ? GI bleed   ? a. 09/2009: secondary to antral ulcer.  ? Hiatal hernia   ? History of diverticulitis Dx 02/2012  ? History of MI (myocardial infarction)   ? HYPERLIPIDEMIA   ? HYPERTENSION   ? IBS 12/07/2008  ? MIGRAINE WITH AURA 08/13/2007  ?  Mild carotid artery disease ([place])   ? a. 0-39% bilat 05/2011 (f/u recommended 05/2013). //  b. Carotid US 3/17 - Bilat ICA 1-39% >> FU prn  ? Muscle weakness (generalized) 09/27/2009  ? OSTEOARTHRITIS 08/13/2007  ? PAF (paroxysmal atrial fibrillation) ([place]) 11/25/2006  ? a. Multaq >> changes to Amiodarone 2/2 $$  //  Eliquis (CHADS2-VASc = 6)  ? Urinary, incontinence, stress female   ? wears Pessary  ? ? ?Medications:  ?Medications Prior to Admission  ?Medication Sig Dispense Refill Last Dose  ? acetaminophen (TYLENOL) 325 MG tablet Take 325 mg by mouth every 6 (six) hours as needed for moderate pain or mild pain.   unk  ? bumetanide (BUMEX) 1 MG tablet Take 0.5 mg by mouth daily.   08/22/2021  ? divalproex (DEPAKOTE) 125 MG DR tablet Take 125 mg by mouth daily. 1400   08/22/2021 at 1400  ? divalproex (DEPAKOTE) 250 MG DR tablet Take 250 mg by mouth 2 (two) times daily.   08/22/2021  ? donepezil (ARICEPT) 5 MG tablet Take 5 mg by mouth at bedtime.   08/22/2021  ? ELIQUIS 5 MG TABS tablet Take 1 tablet (5 mg total) by mouth 2 (two)  times daily. 180 tablet 3 08/22/2021 at 2000  ? ezetimibe (ZETIA) 10 MG tablet Take 1 tablet (10 mg total) by mouth daily. 30 tablet 0 08/22/2021  ? meclizine (ANTIVERT) 25 MG tablet Take 25 mg by mouth every 8 (eight) hours as needed for dizziness.   unk  ? methenamine (HIPREX) 1 g tablet Take 1 g by mouth 2 (two) times daily.   08/22/2021  ? metoprolol tartrate (LOPRESSOR) 50 MG tablet Take 50 mg by mouth 2 (two) times daily.   08/22/2021 at 2000  ? Multiple Vitamin (MULTIVITAMIN) capsule Take 1 capsule by mouth daily. Centrum silver   08/22/2021  ? Multiple Vitamins-Minerals (PRESERVISION AREDS) CAPS Take 1 capsule by mouth 2 (two) times daily.   08/22/2021  ? nitrofurantoin (MACRODANTIN) 50 MG capsule Take 50 mg by mouth daily. For prophylaxis   08/22/2021  ? nitroGLYCERIN (NITROSTAT) 0.4 MG SL tablet Place 0.4 mg under the tongue every 5 (five) minutes x 3 doses as needed for chest pain.   unk  ? pantoprazole (PROTONIX) 40 MG tablet TAKE ONE TABLET TWICE DAILY (Patient taking differently: Take 40 mg by mouth daily.) 180 tablet 2 08/22/2021  ? tolterodine (DETROL) 2 MG tablet Take 2 mg by mouth daily.   08/22/2021  ? vitamin C (ASCORBIC ACID) 250 MG tablet Take 250 mg by mouth daily.   08/22/2021  ? amiodarone (PACERONE) 200 MG tablet TAKE ONE-HALF TABLET DAILY (Patient not taking: Reported on 04/03/2021) 15 tablet 5 Not Taking  ? DULoxetine (CYMBALTA) 60 MG capsule Take 1 capsule (60 mg total) by mouth daily. For mood and chronic pain (Patient not taking: Reported on 08/25/2021) 30 capsule 6 Not Taking  ? melatonin 5 MG TABS Take 5 mg by mouth at bedtime. (Patient not taking: Reported on 08/26/2021)   Not Taking  ? mirabegron ER (MYRBETRIQ) 25 MG TB24 tablet Take 25 mg by mouth daily. (Patient not taking: Reported on 08/25/2021)   Not Taking  ? ?Scheduled:  ? amiodarone  100 mg Oral Daily  ? divalproex  250 mg Oral BID  ? DULoxetine  60 mg Oral QHS  ? enoxaparin (LOVENOX) injection  70 mg Subcutaneous BID  ? ezetimibe  10 mg  Oral Daily  ? melatonin  5 mg Oral QHS  ?  methenamine  1 g Oral BID  ? metoprolol tartrate  100 mg Oral BID  ? nitrofurantoin  50 mg Oral Daily  ? ?Infusions:  ? dextrose 5 % and 0.45% NaCl 75 mL/hr at 08/26/21 1015  ? potassium chloride    ? thiamine injection 500 mg (08/26/21 1017)  ? ? ?Assessment: ?Pt has been on apixaban for AF. She was admitted for CHG exacerbation. Due to her poor PO intake today, we will bridge her with Lovenox. ? ?Scr<1 CrCl~45  ?Goal of Therapy:  ?Anti-Xa level 0.6-1 units/ml 4hrs after LMWH dose given ?Monitor platelets by anticoagulation protocol: Yes ?  ?Plan:  ?Lovenox '70mg'$  SQ BID ?F/u resume apixaban ? ?Onnie Boer, PharmD, BCIDP, AAHIVP, CPP ?Infectious Disease Pharmacist ?08/26/2021 11:56 AM ? ? ? ?

## 2021-08-26 NOTE — Progress Notes (Addendum)
? ?Subjective: ?No acute overnight events.  ? ?Patient assessed at bedside this AM. Her daughter is present at bedside. Daughter states that she appears to be improved today. She is oriented to person, and is not able to appropriately answer questions. She is not able to follow commands. She does not have any new motor changes, as appreciated yesterday. Nurse did note left arm pain and swelling.  ? ?Objective: ? ?Vital signs in last 24 hours: ?Vitals:  ? 08/27/21 0433 08/27/21 0500 08/27/21 0725 08/27/21 1154  ?BP: 139/70  (!) 153/103 (!) 150/108  ?Pulse: 97  (!) 117 (!) 121  ?Resp:   20 20  ?Temp: 98.1 ?F (36.7 ?C)  97.8 ?F (36.6 ?C) 98.4 ?F (36.9 ?C)  ?TempSrc: Oral  Axillary Oral  ?SpO2: 98%     ?Weight:  70.2 kg    ?Height:      ? ?Constitutional: elderly female, awake and lying in bed, opens eyes to voice and tracks speaker, in no acute distress ?HENT: normocephalic, mucous membranes dry ?Eyes: EOMI, pupils equal and reactive to light ?Cardiovascular: tachycardic, irregularly irregular, no m/r/g. No pitting edema to lower extremities bilaterally. ?Pulmonary/Chest: normal work of breathing on room air, lungs clear to auscultation bilaterally ?Abdominal: soft, non-tender, non-distended ?MSK: No erythema or TTP along left arm. She does have generalized TTP of the left wrist, as well as pain with left wrist extension and abduction and adduction.  ?Neurological: A&O to person, but unable to tell me age, location, or situation. She is does not appropriately follows commands or respond.  ?Skin: warm and dry ? ?Assessment/Plan: ? ?Principal Problem: ?  Acute exacerbation of congestive heart failure ([place]) ?Active Problems: ?  Encephalopathy ?  Atrial fibrillation ([place]) ?  Dementia with behavioral disturbance ([place]) ? ?Marissa Bowen is a 88 y.o. with a pertinent PMH of vascular dementia and heart failure with preserved ejection fraction, permanent atrial fibrillation admitted for acute on chronic heart failure  exacerbation complicated by acute encephalopathy on HD#4.  ? ?Acute encephalopathy of unknown origin ?Hx of vascular dementia ?Worsening/ not improving mentation since admission. Repeat CT and MRI negative for acute intracranial abnormalities. This morning she intermittently responded to questions, and was not able to follow commands. Initially thought hospital delirium d/t waxing and waning nature, however this would not explain progression over the last 2 weeks at home. UA negative and not having urinary retention. No respiratory symptoms and CXR largely reassuring. Dx remains unclear at this time, though likely that this could be progression of her known dx of vascular dementia with superimposed hospital delirium. Family is interested in speaking with palliative medicine for [place] discussion. TOC also on board for SNF placement assistance.  ? ?Tachycardia  ?Permanent atrial fibrillation with RVR ?HTN ?Rates in 150's on admission, and back up to 120's yesterday since being transitioned off of her PO medications. Amiodarone has been on hold for 2 days, but she has been receiving IV metoprolol 2.5 mg q8h. Suspect tachycardia 2/2 Afib versus from volume depletion given dry mucous membranes. BP 140/80's. SLP re-evaluated today -> advanced to dysphagia 1 diet.  ?-increased IV metoprolol 2.5 -> 5 mg q 8 hours  ?-Resume amiodarone 100 mg daily (crushed) ?-Continue enoxaparin  ?  ?Starvation ketosis, resolved  ?Hyponatremia ?Anion gap closed. Na was stable at 131, but slightly down to 129 in setting of D5 1/2 NS. Advanced diet to dysphagia 1 today.  ?-D5 discontinued  ?-Continue with dysphagia 1 diet  ? ?Left wrist pain   ?  Pt complaining of left wrist pain this morning. She did have a recent fall but did not directly fall on left wrist. Nurse noted left arm erythema and swelling. On exam, I do not appreciate arm swelling or erythema concerning for thrombosis/ clot. She does however have pain with wrist extension, and  abduction and adduction. There is no focal erythema or TTP. XR wrist obtained; negative for fracture, but is suggestive of mild distal scaphoid collapse superimposed on chronic severe degenerative changes, and mod-severe thumb OA.  ?- Voltaren gel ordered ?- Scheduled tylenol once pt able to take PO meds  ? ?chronic diastolic heart failure, currently volume down ?Patient appears dry on exam with dry mucus membranes, lungs are clear to auscultation with stable oxygen saturations on room air.  Her weight is down about 3.8 kg down from admission. Diuresis being held for past 2 days.  ?-Holding home bumetanide 0.5 mg qd ?-Strict I's and O's ?-Daily weights ?-Keep mag greater than 2 and potassium greater than 4 ?-Encourage PO intake ?  ?Urinary incontinence ?-Holding methenamine and nitrofurantoin ?  ?Depression ?Anxiety ?She is followed by psychiatric services at [facility]. Medications include Depakote and duloxetine. ?-Valproate to 50 IV every 12 hours ?-Holding duloxetine ? ? ?Diet: Dysphagia 1 diet ?IVF: none  ?VTE: Enoxaparin   ?Code: DNR ? ?Lajean Manes, MD  ?Internal Medicine Resident, PGY-1 ?Pager: [phone] ?After 5pm on weekdays and 1pm on weekends: On Call pager [phone] ?

## 2021-08-26 NOTE — Progress Notes (Signed)
? ? ?HD#3 ?Subjective:  ?Overnight Events: none ? ?Patient assessed at bedside this AM.  She continues to not respond to questions consistently.  Speech is understandable when she responds to questions. She is not able to follow commands. She moves right upper and lower extremities bilaterally. She decreased movement in left upper extremity. ? ?Objective:  ?Vital signs in last 24 hours: ?Vitals:  ? 08/25/21 2014 08/25/21 2359 08/26/21 0452 08/26/21 0454  ?BP: 121/88 (!) 159/91 (!) 162/96   ?Pulse: 83 74 74   ?Resp: 20 20 (!) 23   ?Temp: 98.1 ?F (36.7 ?C) 98 ?F (36.7 ?C) 97.8 ?F (36.6 ?C)   ?TempSrc: Oral Oral Axillary   ?SpO2: 92% 98% 95%   ?Weight:    70.4 kg  ?Height:      ? ?Supplemental O2: Room Air ?SpO2: 95 % ?O2 Flow Rate (L/min): 2 L/min ? ? ?Physical Exam:  ?Constitutional: elderly female, sleepy, opens eyes to voice and tracks speaker, in no acute distress ?HENT: normocephalic, mucous membranes moist ?Eyes: EOMI, pupils equal and reactive to light ?Cardiovascular: tachycardic, irregularly irregular, no m/r/g ?Pulmonary/Chest: normal work of breathing on room air, lungs clear to auscultation bilaterally ?Abdominal: soft, non-tender, non-distended ?MSK: does not follow commands, spontaneous movement in right upper extremity, no movement present in left upper or bilateral lower extremities, no pitting edema to lower extremities bilaterally ?Neurological: sleepy, tracks speaker ?Skin: warm and dry ? ?Filed Weights  ? 08/24/21 0516 08/25/21 0444 08/26/21 0454  ?Weight: 74 kg 71.7 kg 70.4 kg  ? ? ? ?Intake/Output Summary (Last 24 hours) at 08/26/2021 0758 ?Last data filed at 08/26/2021 0600 ?Gross per 24 hour  ?Intake 1081.57 ml  ?Output 300 ml  ?Net 781.57 ml  ? ?Net IO Since Admission: -1,428.43 mL [08/26/21 0758] ? ?Pertinent Labs: ? ?  Latest Ref Rng & Units 08/26/2021  ?  3:56 AM 08/25/2021  ?  2:28 AM 08/24/2021  ?  3:39 AM  ?CBC  ?WBC 4.0 - 10.5 K/uL 10.9   15.5   10.8    ?Hemoglobin 12.0 - 15.0 g/dL 13.9    14.7   12.8    ?Hematocrit 36.0 - 46.0 % 41.8   46.8   38.4    ?Platelets 150 - 400 K/uL 247   247   209    ? ? ? ?  Latest Ref Rng & Units 08/26/2021  ?  3:56 AM 08/25/2021  ?  7:57 PM 08/25/2021  ?  1:15 PM  ?CMP  ?Glucose 70 - 99 mg/dL 131   110   111    ?BUN 8 - 23 mg/dL '17   17   15    '$ ?Creatinine 0.44 - 1.00 mg/dL 0.62   0.66   0.61    ?Sodium 135 - 145 mmol/L 131   132   132    ?Potassium 3.5 - 5.1 mmol/L 3.7   3.7   3.6    ?Chloride 98 - 111 mmol/L 95   92   95    ?CO2 22 - 32 mmol/L '26   27   28    '$ ?Calcium 8.9 - 10.3 mg/dL 8.9   9.3   9.0    ?Total Protein 6.5 - 8.1 g/dL 6.7      ?Total Bilirubin 0.3 - 1.2 mg/dL 1.0      ?Alkaline Phos 38 - 126 U/L 55      ?AST 15 - 41 U/L 24      ?ALT 0 -  44 U/L 17      ? ? ?Imaging: ?No results found. ? ?Assessment/Plan:  ? ?Principal Problem: ?  Acute exacerbation of congestive heart failure ([place]) ?Active Problems: ?  Encephalopathy ?  Atrial fibrillation ([place]) ?  Dementia with behavioral disturbance ([place]) ? ? ?Patient Summary: ?Marissa Bowen is a 88 y.o. with a pertinent PMH of vascular dementia and heart failure with preserved ejection fraction, permanent atrial fibrillation admitted for acute on chronic heart failure exacerbation complicated by acute encephalopathy on HD#3.  ? ? ?Acute encephalopathy of unknown origin ?History of vascular dementia ?Patient's mentation has been worsening over the last 2 weeks, acutely worsened yesterday. This morning she intermittently responded to questions, was not able to follow commands, but was noted to have decreased movement in left upper extremity. Repeat CT scan showed no acute intracranial abnormality, will further evaluate with MRI. She had metabolic acidosis yesterday, but this has now resolved. Differentials include possible stroke. Her exam is not as consistent with this as symptoms appear to wax and wane. Mentation could also be related to hospital delirium, although this would not explain why she was having a decline  over the last 2 weeks. UA was negative for nitrates/ leukocytes and culture consistent with colonization. She does not have respiratory symptoms and CXR reflected volume overload rather than infective process. I also thought this could be related to urinary retention. Urine output at 300cc yesterday. Bladder scan this morning showed 50cc, so this is not contributing.  ?-MRI wo contrast ?-D5 1/2 NS at 75 mL/hr ?-q 4 hours CBG check ? ?Starvation ketosis ?Hyponatremia ?Anion gap closed at 10 today. Na stable at 131. Speech evaluated her today and she is not able to safely take PO medications or eat. Will continue D5 1/2 NS today. ?-D5 1/2 NS 75 mL/ hr ? ?Acute on chronic heart failure ?Medications include bumetanide 0.5 mg daily.  Patient appears euvolemic on exam, lungs are clear to auscultation with stable oxygen saturations on room air.  Her weight is down about 3 kg down from admission.  Diuresis was held yesterday and today as well.  ?-Hold diuresis ?-Strict I's and O's ?-Daily weights ?-Keep mag greater than 2 and potassium greater than 4 ? ?Permanent atrial fibrillation with RVR ?Hypertension ?Patient is not able to take p.o. medications today.  I will switch amiodarone and metoprolol to IV as well as start Lovenox for anticoagulation. ?-switch metoprolol from PO to IV, 2.5 mg q 8 hours and plan to titrate as needed  ?-discontinue amiodarone. ?-Discontinue apixaban, start enoxaparin  ? ?Urinary incontinence ?-hold methenamine and nitrofurantoin ? ?Depression ?Anxiety ?She is followed by [facility] services at [facility].  Medications include Depakote and duloxetine. ?-Valproate to 50 IV every 12 hours ?-hold duloxetine ? ? ?Diet: NPO ?IVF: D51/2 NS,75cc/hr ?VTE: Enoxaparin for atrial fibrillation ?Code: DNR ?PT/OT recs: SNF for Subacute PT ?Family Update: son updated at bedside ? ? ?Dispo: Anticipated discharge to Skilled nursing facility pending further evaluation of acute encephalopathy.  ? ?Daleen Bo. Antanette Richwine,  D.O.  ?Internal Medicine Resident, PGY-1 ?[facility]  ?Pager: [phone] ?7:58 AM, 08/26/2021  ? ?**Please contact the on call pager after 5 pm and on weekends at [phone].** ? ?   ?

## 2021-08-27 ENCOUNTER — Inpatient Hospital Stay (HOSPITAL_COMMUNITY): Payer: Medicare Other

## 2021-08-27 DIAGNOSIS — I480 Paroxysmal atrial fibrillation: Secondary | ICD-10-CM

## 2021-08-27 LAB — GLUCOSE, CAPILLARY
Glucose-Capillary: 138 mg/dL — ABNORMAL HIGH (ref 70–99)
Glucose-Capillary: 145 mg/dL — ABNORMAL HIGH (ref 70–99)
Glucose-Capillary: 149 mg/dL — ABNORMAL HIGH (ref 70–99)
Glucose-Capillary: 152 mg/dL — ABNORMAL HIGH (ref 70–99)
Glucose-Capillary: 161 mg/dL — ABNORMAL HIGH (ref 70–99)
Glucose-Capillary: 179 mg/dL — ABNORMAL HIGH (ref 70–99)

## 2021-08-27 LAB — CBC
HCT: 40.1 % (ref 36.0–46.0)
Hemoglobin: 13.3 g/dL (ref 12.0–15.0)
MCH: 29.5 pg (ref 26.0–34.0)
MCHC: 33.2 g/dL (ref 30.0–36.0)
MCV: 88.9 fL (ref 80.0–100.0)
Platelets: 235 10*3/uL (ref 150–400)
RBC: 4.51 MIL/uL (ref 3.87–5.11)
RDW: 15.5 % (ref 11.5–15.5)
WBC: 11.7 10*3/uL — ABNORMAL HIGH (ref 4.0–10.5)
nRBC: 0 % (ref 0.0–0.2)

## 2021-08-27 LAB — BASIC METABOLIC PANEL
Anion gap: 11 (ref 5–15)
BUN: 15 mg/dL (ref 8–23)
CO2: 24 mmol/L (ref 22–32)
Calcium: 8.6 mg/dL — ABNORMAL LOW (ref 8.9–10.3)
Chloride: 94 mmol/L — ABNORMAL LOW (ref 98–111)
Creatinine, Ser: 0.61 mg/dL (ref 0.44–1.00)
GFR, Estimated: >60 mL/min (ref >60)
Glucose, Bld: 130 mg/dL — ABNORMAL HIGH (ref 70–99)
Potassium: 3.7 mmol/L (ref 3.5–5.1)
Sodium: 129 mmol/L — ABNORMAL LOW (ref 135–145)

## 2021-08-27 LAB — MAGNESIUM: Magnesium: 1.9 mg/dL (ref 1.7–2.4)

## 2021-08-27 MED ORDER — ACETAMINOPHEN 325 MG PO TABS
650.0000 mg | ORAL_TABLET | Freq: Three times a day (TID) | ORAL | Status: DC
  Administered 2021-08-27 – 2021-08-29 (×5): 650 mg via ORAL
  Filled 2021-08-27 (×5): qty 2

## 2021-08-27 MED ORDER — DICLOFENAC SODIUM 1 % EX GEL
2.0000 g | Freq: Four times a day (QID) | CUTANEOUS | Status: DC
  Administered 2021-08-27 – 2021-08-29 (×7): 2 g via TOPICAL
  Filled 2021-08-27: qty 100

## 2021-08-27 MED ORDER — METOPROLOL TARTRATE 5 MG/5ML IV SOLN
5.0000 mg | Freq: Three times a day (TID) | INTRAVENOUS | Status: DC
  Administered 2021-08-27 – 2021-08-28 (×3): 5 mg via INTRAVENOUS
  Filled 2021-08-27 (×3): qty 5

## 2021-08-27 NOTE — Plan of Care (Signed)

## 2021-08-27 NOTE — Progress Notes (Signed)
?   08/24/21 0900  ?SLP Visit Information  ?SLP Received On 08/24/21  ?General Information  ?HPI Marissa Bowen is a 88 y.o. female with a past medical history significant for CAD status post CABG, previous TIA/stroke, dementia, paroxysmal atrial fibrillation on Eliquis therapy, previous GI bleeding, previous diverticulitis, IBS, migraines, GERD, previous cholecystectomy, and most recent fall yesterday who presents with altered mental status, recurrent falls, generalized weakness, and worsening peripheral edema.[place] reports poor PO intake.  ?Type of Study Bedside Swallow Evaluation  ?Diet Prior to this Study Regular;Thin liquids  ?Temperature Spikes Noted No  ?Respiratory Status Room air  ?History of Recent Intubation No  ?Behavior/Cognition Alert;Cooperative;Lethargic/Drowsy  ?Oral Cavity Assessment WFL  ?Oral Care Completed by SLP No  ?Oral Cavity - Dentition Adequate natural dentition  ?Vision Functional for self-feeding  ?Self-Feeding Abilities Able to feed self;Needs assist  ?Patient Positioning Upright in bed  ?Baseline Vocal Quality Normal  ?Volitional Cough Strong  ?Volitional Swallow Able to elicit  ?Oral Motor/Sensory Function  ?Overall Oral Motor/Sensory Function WFL  ?Thin Liquid  ?Thin Liquid WFL  ?Presentation Straw;Self Fed  ?Nectar Thick Liquid  ?Nectar Thick Liquid NT  ?Honey Thick Liquid  ?Honey Thick Liquid NT  ?Puree  ?Puree WFL  ?Solid  ?Solid WFL  ?SLP Assessment  ?Clinical Impression Statement (ACUTE ONLY) Pt demonstrates lethargy and needs cues to self fed and sometimes physical assist to put food to her mouth but otherwise is able to consume liquids and solids with no sign of difficulty. She may need increased assist at her facility to feed herself, but needs no texture modification or f/u with SLP. Will sign off.  ?SLP Visit Diagnosis Dysphagia, unspecified (R13.10)  ?Impact on safety and function Mild aspiration risk;Risk for inadequate nutrition/hydration  ?Other Related Risk Factors  Lethargy;Cognitive impairment  ?Swallow Evaluation Recommendations  ?SLP Diet Recommendations Regular;Thin liquid  ?Liquid Administration via Cup;Straw  ?Medication Administration Whole meds with liquid  ?Supervision Staff to assist with self feeding  ?Compensations Slow rate;Minimize environmental distractions  ?Postural Changes Seated upright at 90 degrees  ?Treatment Plan  ?Oral Care Recommendations Oral care BID  ?Treatment Recommendations No treatment recommended at this time  ?Follow Up Recommendations No SLP follow up  ?Individuals Consulted  ?Consulted and Agree with Results and Recommendations Patient  ?SLP Time Calculation  ?SLP Start Time (ACUTE ONLY) 0930  ?SLP Stop Time (ACUTE ONLY) 0940  ?SLP Time Calculation (min) (ACUTE ONLY) 10 min  ?SLP Evaluations  ?$ SLP Speech Visit 1 Visit  ?SLP Evaluations  ?$BSS Swallow 1 Procedure  ? ? ?

## 2021-08-27 NOTE — TOC CAGE-AID Note (Deleted)
Transition of Care (TOC) - CAGE-AID Screening ? ? ?Patient Details  ?Name: Marissa Bowen ?MRN: [id number] ?Date of Birth: [birth year] ? ?Transition of Care (TOC) CM/SW Contact:    ?Coralee Pesa, LCSWA ?Phone Number: ?08/27/2021, 10:44 AM ? ? ?Clinical Narrative: ?Patient unable to participate in [place]. Experiencing Dementia. ? ? ?CAGE-AID Screening: ?  ? ?  ?  ?  ?  ?  ? ?  ? ?  ? ? ? ? ? ? ?

## 2021-08-27 NOTE — Progress Notes (Signed)
?   08/27/21 1700  ?Assess: MEWS Score  ?Temp 98.3 ?F (36.8 ?C)  ?BP (!) 147/98  ?ECG Heart Rate (!) 130  ?Resp (!) 22  ?Level of Consciousness Alert  ?Assess: MEWS Score  ?MEWS Temp 0  ?MEWS Systolic 0  ?MEWS Pulse 3  ?MEWS RR 1  ?MEWS LOC 0  ?MEWS Score 4  ?MEWS Score Color Red  ?Assess: if the MEWS score is Yellow or Red  ?Were vital signs taken at a resting state? No ?(Pt eating)  ?Focused Assessment No change from prior assessment  ?Early Detection of Sepsis Score *See Row Information* Low  ?MEWS guidelines implemented *See Row Information* Yes  ? ?Pt's HR remains in afib 100-150s. ?IV metoprolol was started.  Will continue to monitor. ?

## 2021-08-27 NOTE — Care Management Important Message (Signed)
Important Message ? ?Patient Details  ?Name: Marissa Bowen ?MRN: [id number] ?Date of Birth: Feb 20, 1927 ? ? ?Medicare Important Message Given:  Yes ? ? ? ? ?Shelda Altes ?08/27/2021, 9:26 AM ?

## 2021-08-27 NOTE — [place] FL2 (Signed)
?[place] MEDICAID FL2 LEVEL OF CARE SCREENING TOOL  ?  ? ?IDENTIFICATION  ?Patient Name: ?Marissa Bowen Birthdate: [birth year] Sex: female Admission Date (Current Location): ?08/23/2021  ?[place] and [place] Number: ? [place] ?  Facility and Address:  ?[facility], [place] [address], [place],  [address] ?     Provider Number: ?[id number]  ?Attending Physician Name and Address:  ?Sid Falcon, MD ? Relative Name and Phone Number:  ?[name] [phone] ?   ?Current Level of Care: ?Hospital Recommended Level of Care: ?[place] Prior Approval Number: ?  ? ?Date Approved/Denied: ?  PASRR Number: ?[id number] A ? ?Discharge Plan: ?SNF ?  ? ?Current Diagnoses: ?Patient Active Problem List  ? Diagnosis Date Noted  ? Acute exacerbation of congestive heart failure ([place]) 08/23/2021  ? Dementia with behavioral disturbance ([place]) 04/22/2021  ? Acute metabolic encephalopathy 01/60/1093  ? Hallucinations 03/11/2017  ? Essential tremor 03/11/2017  ? Prolonged Q-T interval on ECG 02/28/2017  ? Recurrent falls 02/27/2017  ? GAD (generalized anxiety disorder) 11/19/2016  ? Macular degeneration of both eyes 09/22/2015  ? Carotid stenosis 08/10/2015  ? On amiodarone therapy 08/10/2015  ? Chronic cholecystitis with calculus 01/03/2015  ? Allergic rhinitis 08/05/2014  ? Essential hypertension 05/03/2014  ? Osteoarthritis 09/29/2013  ? Routine general medical examination at a health care facility 09/29/2013  ? Depression with anxiety 08/03/2013  ? Atrial fibrillation ([place]) 09/02/2012  ? Anemia due to chronic blood loss 08/18/2012  ? Diarrhea 06/08/2012  ? Postural hypotension 06/08/2012  ? ACUTE POSTHEMORRHAGIC ANEMIA 10/23/2009  ? BACK PAIN, LUMBAR 09/27/2009  ? MUSCLE WEAKNESS (GENERALIZED) 09/27/2009  ? IBS 12/07/2008  ? Weakness 07/21/2008  ? Encephalopathy 08/13/2007  ? OSTEOARTHRITIS 08/13/2007  ? Hyperlipidemia LDL goal <100 11/25/2006  ? CAD (coronary artery disease) 11/25/2006  ? GERD  11/25/2006  ? DIVERTICULITIS, HX OF 11/25/2006  ? ? ?Orientation RESPIRATION BLADDER Height & Weight   ?  ? (Memory Impairment) ? O2 (Nasal Cannula 2 liters) Incontinent, External catheter (External Urinary Catheter) Weight: 154 lb 11.2 oz (70.2 kg) ?Height:  5' 7.5" (171.5 cm)  ?BEHAVIORAL SYMPTOMS/MOOD NEUROLOGICAL BOWEL NUTRITION STATUS  ?    Incontinent Diet (Please see discharge summary)  ?AMBULATORY STATUS COMMUNICATION OF NEEDS Skin   ?Extensive Assist Verbally Other (Comment) (appropriate for ethnicity,flaky,wound incision open or dehiced non pressure wound thigh left,posterior,skin tear or abrasion looking (non-pressure related) foam lift dressing,clean,dry,intact) ?  ?  ?  ?    ?     ?     ? ? ?Personal Care Assistance Level of Assistance  ?Bathing, Feeding, Dressing Bathing Assistance: Maximum assistance ?Feeding assistance: Maximum assistance (NPO) ?Dressing Assistance: Maximum assistance ?   ? ?Functional Limitations Info  ?Sight, Hearing, Speech Sight Info: Adequate ?Hearing Info: Adequate ?Speech Info: Adequate  ? ? ?SPECIAL CARE FACTORS FREQUENCY  ?PT (By licensed PT), OT (By licensed OT)   ?  ?PT Frequency: 5x min weekly ?OT Frequency: 5x min weekly ?  ?  ?  ?   ? ? ?Contractures Contractures Info: Not present  ? ? ?Additional Factors Info  ?Code Status, Allergies Code Status Info: DNR ?Allergies Info: Asa (aspirin),Crestor (rosuvastatin Calcium),Hydrocodone,Ibuprofen,Oxycodone Hcl,Penicillins,Prednisone,Statins,Morphine And Related,Pristiq (desvenlafaxine) ?  ?  ?  ?   ? ?Current Medications (08/27/2021):  This is the current hospital active medication list ?Current Facility-Administered Medications  ?Medication Dose Route Frequency Provider Last Rate Last Admin  ? amiodarone (PACERONE) tablet 100 mg  100 mg Oral Daily [place],  Charisse March, MD   100 mg at 08/25/21 1019  ? enoxaparin (LOVENOX) injection 70 mg  70 mg Subcutaneous BID Pham, Minh Q, RPH-CPP   70 mg at 08/27/21 1018  ? metoprolol tartrate  (LOPRESSOR) injection 2.5 mg  2.5 mg Intravenous Q8H Masters, Katie, DO   2.5 mg at 08/27/21 0513  ? valproate (DEPACON) 250 mg in dextrose 5 % 50 mL IVPB  250 mg Intravenous Q12H Masters, Katie, DO 52.5 mL/hr at 08/27/21 1019 250 mg at 08/27/21 1019  ? ? ? ?Discharge Medications: ?Please see discharge summary for a list of discharge medications. ? ?Relevant Imaging Results: ? ?Relevant Lab Results: ? ? ?Additional Information ?SSN-[id number] ? ?[name], LCSWA ? ? ? ? ?

## 2021-08-27 NOTE — Progress Notes (Signed)
Speech Language Pathology Treatment: Dysphagia  ?Patient Details ?Name: Marissa Bowen ?MRN: [id number] ?DOB: 03/08/1927 ?Today's Date: 08/27/2021 ?Time: 1243-1310 ?SLP Time Calculation (min) (ACUTE ONLY): 27 min ? ?Assessment / Plan / Recommendation ?Clinical Impression ? Pt demonstrates as inattentive, requires heavy cueing to respond, loses attention while manipulating PO. She does need total assisted feeding today, verbal cued to swallow if holding, benefits from nectar thick liquids given immediate coughing with thin. Demonstrates some methods to daughter in room. Ability to consume PO may fluctuate throughout the day. If too lethargic and/or unresponsive to assisted feeding, hold PO. Otherwise attempt careful hand feeding. Will initiate puree and nectar thick liquids.   ?HPI HPI: Marissa Bowen is 88yo person with vascular dementia, heart failure with preserved ejection fraction, permanent atrial fibrillation admitted 4/27 with acute on chronic heart failure and acute encephalopathy. H/o vascular dementia. With new changes, poorly responsive, non verbal 4/30. Swallow evaluation ordered. ?  ?   ?SLP Plan ? Continue with current plan of care ? ?  ?  ?Recommendations for follow up therapy are one component of a multi-disciplinary discharge planning process, led by the attending physician.  Recommendations may be updated based on patient status, additional functional criteria and insurance authorization. ?  ? ?Recommendations  ?Diet recommendations: Dysphagia 1 (puree);Nectar-thick liquid ?Liquids provided via: Cup;Teaspoon;Straw ?Medication Administration: Crushed with puree ?Supervision: Full supervision/cueing for compensatory strategies ?Compensations: Small sips/bites;Slow rate ?Postural Changes and/or Swallow Maneuvers: Seated upright 90 degrees  ?   ?    ?   ? ? ? ? Oral Care Recommendations: Oral care BID ?Follow Up Recommendations: Skilled nursing-short term rehab (<3 hours/day) ?Plan: Continue with current plan  of care ? ? ? ? ?  ?  ? ? ?Kelten Enochs, Katherene Ponto ? ?08/27/2021, 2:36 PM ?

## 2021-08-27 NOTE — Progress Notes (Addendum)
? ?Subjective: ?overnight events: NAOE  ? ?Patient assessed at bedside this AM. She remains unable to answer questions appropriately, and she is still unable to follow commands. She does appear slightly improved from previous days.  ? ?Objective: ? ?Vital signs in last 24 hours: ?Vitals:  ? 08/28/21 0400 08/28/21 0500 08/28/21 0816 08/28/21 1045  ?BP: (!) 147/88  (!) 156/103   ?Pulse: 98  99   ?Resp: 20  20   ?Temp: 98.7 ?F (37.1 ?C)  98.2 ?F (36.8 ?C)   ?TempSrc: Oral  Oral   ?SpO2: 96%  97% (!) 89%  ?Weight:  70.1 kg    ?Height:      ? ?Constitutional: elderly female, awake and lying in bed, opens eyes to voice and tracks speaker, in no acute distress ?HENT: normocephalic, mucous membranes slightly dry ?Cardiovascular: tachycardic, irregularly irregular, no m/r/g. No pitting edema to lower extremities bilaterally. ?Pulmonary/Chest: normal work of breathing on room air, lungs clear to auscultation bilaterally ?Abdominal: soft, non-tender, non-distended ?MSK: No erythema or TTP along left arm. She does have generalized TTP of the left wrist with some overlying edema.  ?Neurological: A&O to person, but unable to tell me age, location, or situation. She is does not appropriately follows commands or respond.  ?Skin: warm and dry ? ?Assessment/Plan: ? ?Principal Problem: ?  Acute exacerbation of congestive heart failure ([place]) ?Active Problems: ?  Encephalopathy ?  Atrial fibrillation ([place]) ?  Dementia with behavioral disturbance ([place]) ? ?KAMMI HECHLER is a 88 y.o. with a pertinent PMH of vascular dementia and heart failure with preserved ejection fraction, permanent atrial fibrillation admitted for acute on chronic heart failure exacerbation complicated by acute encephalopathy on HD#5.  ? ?End stage dementia  ?Goals of care  ?Barely improved mentation since admission, though no where near her baseline; family believe she has actually been getting worse. She is not eating or drinking well. Workup for reversible causes  negative till date. Suspect this is likely end stage dementia, from progression of her known dx of vascular dementia. I was present with palliative medicine team during [place] discussion at 1 pm today to discuss prognosis and pt's wishes. Pt's daughter and son state that this is not consistent with the QOL she would want, and they wish to proceed with comfort care measures and hopefully transfer to [facility] soon.  ? ?Tachycardia  ?Permanent atrial fibrillation with RVR ?HTN ?Rates in 150's on admission, and in 120's last 2 days in the setting of medication adjustments as she was unable to tolerate po intake. Restarted po amiodarone yesterday and up-titrated dose of IV metoprolol 5 mg q8h, without any significant improvement. Suspect tachycardia 2/2 Afib versus from volume depletion given dry mucous membranes and poor PO intake. Family meeting as above. Discontinued metoprolol, amiodarone, and enoxaparin, as family wish to proceed with comfort care measures at this time.   ?  ?Starvation ketosis, resolved  ?Hyponatremia ?Anion gap closed. Na was stable at 133. Advanced diet to dysphagia 1 yesterday. Continue with dysphagia 1 diet  ? ?Chronic diastolic heart failure, currently volume down   ?Patient appears slightly dry on exam with dry mucus membranes. Lungs are clear to auscultation with stable oxygen saturations on room air.  Her weight is down about 4 kg down from admission. Proceeding with comfort care measures at this time. Discontinued medications, labs, I/O's, and weights.  ? ? ?Diet: Dysphagia 1 diet ?IVF: none  ?VTE: none   ?Code: DNR ? ?Lajean Manes, MD  ?Internal  Medicine Resident, PGY-1 ?Pager: [phone] ?After 5pm on weekdays and 1pm on weekends: On Call pager [phone] ?

## 2021-08-27 NOTE — TOC Progression Note (Addendum)
Transition of Care (TOC) - Progression Note  ? ? ?Patient Details  ?Name: Marissa Bowen ?MRN: [id number] ?Date of Birth: Jul 15, 1926 ? ?Transition of Care (TOC) CM/SW Contact  ?[name], LCSWA ?Phone Number: ?08/27/2021, 9:52 AM ? ?Clinical Narrative:    ? ? ?Update- CSW provided patients daughter with SNF bed offers. Patients daughter chose SNF placement at [facility]. CSW called Janie with [facility] who confirmed SNF bed for patient. CSW will continue to follow and assist with patients dc planning needs. ? ? ?CSW spoke with [name] who confirmed they cannot accept patient back with [place] orders. CSW informed patients daughter. Patients daughter now agreeable to SNF placement for patient. Patients daughter gave CSW permission to fax out initial referral near the [place] and [place] area. Pending SNF bed offers. CSW to follow up with patients daughter when SNF bed offers are in. CSW will continue to follow and assist with patients dc planning needs. ? ?  ?Barriers to Discharge: Continued Medical Work up ? ?Expected Discharge Plan and Services ?  ?In-house Referral: Clinical Social Work ?  ?  ?Living arrangements for the past 2 months: [place] [facility]) ?                ?  ?  ?  ?  ?  ?  ?  ?  ?  ?  ? ? ?Social Determinants of Health (SDOH) Interventions ?  ? ?Readmission Risk Interventions ?   ? View : No data to display.  ?  ?  ?  ? ? ?

## 2021-08-28 DIAGNOSIS — Z7189 Other specified counseling: Secondary | ICD-10-CM | POA: Diagnosis not present

## 2021-08-28 DIAGNOSIS — F03918 Unspecified dementia, unspecified severity, with other behavioral disturbance: Secondary | ICD-10-CM | POA: Diagnosis not present

## 2021-08-28 DIAGNOSIS — Z515 Encounter for palliative care: Secondary | ICD-10-CM | POA: Diagnosis not present

## 2021-08-28 DIAGNOSIS — I5033 Acute on chronic diastolic (congestive) heart failure: Secondary | ICD-10-CM | POA: Diagnosis not present

## 2021-08-28 LAB — VALPROIC ACID LEVEL: Valproic Acid Lvl: 24 ug/mL — ABNORMAL LOW (ref 50.0–100.0)

## 2021-08-28 LAB — BASIC METABOLIC PANEL
Anion gap: 11 (ref 5–15)
BUN: 11 mg/dL (ref 8–23)
CO2: 27 mmol/L (ref 22–32)
Calcium: 8.8 mg/dL — ABNORMAL LOW (ref 8.9–10.3)
Chloride: 95 mmol/L — ABNORMAL LOW (ref 98–111)
Creatinine, Ser: 0.53 mg/dL (ref 0.44–1.00)
GFR, Estimated: >60 mL/min (ref >60)
Glucose, Bld: 112 mg/dL — ABNORMAL HIGH (ref 70–99)
Potassium: 3.7 mmol/L (ref 3.5–5.1)
Sodium: 133 mmol/L — ABNORMAL LOW (ref 135–145)

## 2021-08-28 LAB — CBC
HCT: 42.1 % (ref 36.0–46.0)
Hemoglobin: 14 g/dL (ref 12.0–15.0)
MCH: 29.5 pg (ref 26.0–34.0)
MCHC: 33.3 g/dL (ref 30.0–36.0)
MCV: 88.8 fL (ref 80.0–100.0)
Platelets: 219 10*3/uL (ref 150–400)
RBC: 4.74 MIL/uL (ref 3.87–5.11)
RDW: 15.6 % — ABNORMAL HIGH (ref 11.5–15.5)
WBC: 10.6 10*3/uL — ABNORMAL HIGH (ref 4.0–10.5)
nRBC: 0 % (ref 0.0–0.2)

## 2021-08-28 LAB — GLUCOSE, CAPILLARY
Glucose-Capillary: 108 mg/dL — ABNORMAL HIGH (ref 70–99)
Glucose-Capillary: 111 mg/dL — ABNORMAL HIGH (ref 70–99)
Glucose-Capillary: 124 mg/dL — ABNORMAL HIGH (ref 70–99)

## 2021-08-28 LAB — MAGNESIUM: Magnesium: 2 mg/dL (ref 1.7–2.4)

## 2021-08-28 MED ORDER — GLYCOPYRROLATE 1 MG PO TABS
1.0000 mg | ORAL_TABLET | ORAL | Status: DC | PRN
  Filled 2021-08-28: qty 1

## 2021-08-28 MED ORDER — POLYVINYL ALCOHOL 1.4 % OP SOLN
1.0000 [drp] | Freq: Four times a day (QID) | OPHTHALMIC | Status: DC | PRN
  Filled 2021-08-28: qty 15

## 2021-08-28 MED ORDER — LORAZEPAM 2 MG/ML PO CONC: 1.0000 mg | ORAL | Status: DC | PRN

## 2021-08-28 MED ORDER — BIOTENE DRY MOUTH MT LIQD: 15.0000 mL | OROMUCOSAL | Status: DC | PRN

## 2021-08-28 MED ORDER — MORPHINE SULFATE (PF) 2 MG/ML IV SOLN: 1.0000 mg | INTRAVENOUS | Status: DC | PRN

## 2021-08-28 MED ORDER — GLYCOPYRROLATE 0.2 MG/ML IJ SOLN: 0.2000 mg | INTRAMUSCULAR | Status: DC | PRN

## 2021-08-28 MED ORDER — LORAZEPAM 1 MG PO TABS: 1.0000 mg | ORAL_TABLET | ORAL | Status: DC | PRN

## 2021-08-28 MED ORDER — LORAZEPAM 2 MG/ML IJ SOLN
0.5000 mg | Freq: Three times a day (TID) | INTRAMUSCULAR | Status: DC
  Administered 2021-08-28 – 2021-08-29 (×2): 0.5 mg via INTRAVENOUS
  Filled 2021-08-28 (×3): qty 1

## 2021-08-28 MED ORDER — LORAZEPAM 2 MG/ML IJ SOLN: 1.0000 mg | INTRAMUSCULAR | Status: DC | PRN

## 2021-08-28 NOTE — Plan of Care (Signed)
?Problem: Education: ?Goal: Ability to demonstrate management of disease process will improve ?Outcome: Progressing ?Goal: Ability to verbalize understanding of medication therapies will improve ?Outcome: Progressing ?Goal: Individualized Educational Video(s) ?Outcome: Progressing ?  ?Problem: Activity: ?Goal: Capacity to carry out activities will improve ?Outcome: Progressing ?  ?Problem: Cardiac: ?Goal: Ability to achieve and maintain adequate cardiopulmonary perfusion will improve ?Outcome: Progressing ?  ?Problem: Education: ?Goal: Knowledge of General Education information will improve ?Description: Including pain rating scale, medication(s)/side effects and non-pharmacologic comfort measures ?Outcome: Progressing ?  ?Problem: Health Behavior/Discharge Planning: ?Goal: Ability to manage health-related needs will improve ?Outcome: Progressing ?  ?Problem: Clinical Measurements: ?Goal: Ability to maintain clinical measurements within normal limits will improve ?Outcome: Progressing ?Goal: Will remain free from infection ?Outcome: Progressing ?Goal: Diagnostic test results will improve ?Outcome: Progressing ?Goal: Respiratory complications will improve ?Outcome: Progressing ?Goal: Cardiovascular complication will be avoided ?Outcome: Progressing ?  ?Problem: Activity: ?Goal: Risk for activity intolerance will decrease ?Outcome: Progressing ?  ?Problem: Nutrition: ?Goal: Adequate nutrition will be maintained ?Outcome: Progressing ?  ?Problem: Coping: ?Goal: Level of anxiety will decrease ?Outcome: Progressing ?  ?Problem: Elimination: ?Goal: Will not experience complications related to bowel motility ?Outcome: Progressing ?Goal: Will not experience complications related to urinary retention ?Outcome: Progressing ?  ?Problem: Pain Managment: ?Goal: General experience of comfort will improve ?Outcome: Progressing ?  ?Problem: Safety: ?Goal: Ability to remain free from injury will improve ?Outcome: Progressing ?   ?Problem: Skin Integrity: ?Goal: Risk for impaired skin integrity will decrease ?Outcome: Progressing ?  ?Problem: Education: ?Goal: Ability to demonstrate management of disease process will improve ?08/28/2021 2052 by Robley Fries, RN ?Outcome: Not Progressing ?08/28/2021 2052 by Robley Fries, RN ?Outcome: Progressing ?Goal: Ability to verbalize understanding of medication therapies will improve ?08/28/2021 2052 by Robley Fries, RN ?Outcome: Not Progressing ?08/28/2021 2052 by Robley Fries, RN ?Outcome: Progressing ?Goal: Individualized Educational Video(s) ?08/28/2021 2052 by Robley Fries, RN ?Outcome: Not Progressing ?08/28/2021 2052 by Robley Fries, RN ?Outcome: Progressing ?  ?Problem: Activity: ?Goal: Capacity to carry out activities will improve ?08/28/2021 2052 by Robley Fries, RN ?Outcome: Not Progressing ?08/28/2021 2052 by Robley Fries, RN ?Outcome: Progressing ?  ?Problem: Cardiac: ?Goal: Ability to achieve and maintain adequate cardiopulmonary perfusion will improve ?08/28/2021 2052 by Robley Fries, RN ?Outcome: Not Progressing ?08/28/2021 2052 by Robley Fries, RN ?Outcome: Progressing ?  ?Problem: Education: ?Goal: Knowledge of General Education information will improve ?Description: Including pain rating scale, medication(s)/side effects and non-pharmacologic comfort measures ?08/28/2021 2052 by Robley Fries, RN ?Outcome: Not Progressing ?08/28/2021 2052 by Robley Fries, RN ?Outcome: Progressing ?  ?Problem: Health Behavior/Discharge Planning: ?Goal: Ability to manage health-related needs will improve ?08/28/2021 2052 by Robley Fries, RN ?Outcome: Not Progressing ?08/28/2021 2052 by Robley Fries, RN ?Outcome: Progressing ?  ?Problem: Clinical Measurements: ?Goal: Ability to maintain clinical measurements within normal limits will improve ?08/28/2021 2052 by Robley Fries, RN ?Outcome: Not Progressing ?08/28/2021 2052 by Robley Fries, RN ?Outcome: Progressing ?Goal: Will  remain free from infection ?08/28/2021 2052 by Robley Fries, RN ?Outcome: Not Progressing ?08/28/2021 2052 by Robley Fries, RN ?Outcome: Progressing ?Goal: Diagnostic test results will improve ?08/28/2021 2052 by Robley Fries, RN ?Outcome: Not Progressing ?08/28/2021 2052 by Robley Fries, RN ?Outcome: Progressing ?Goal: Respiratory complications will improve ?08/28/2021 2052 by Robley Fries, RN ?Outcome: Not Progressing ?08/28/2021 2052 by Robley Fries, RN ?Outcome: Progressing ?Goal: Cardiovascular complication will  be avoided ?08/28/2021 2052 by Robley Fries, RN ?Outcome: Not Progressing ?08/28/2021 2052 by Robley Fries, RN ?Outcome: Progressing ?  ?Problem: Activity: ?Goal: Risk for activity intolerance will decrease ?08/28/2021 2052 by Robley Fries, RN ?Outcome: Not Progressing ?08/28/2021 2052 by Robley Fries, RN ?Outcome: Progressing ?  ?Problem: Nutrition: ?Goal: Adequate nutrition will be maintained ?08/28/2021 2052 by Robley Fries, RN ?Outcome: Not Progressing ?08/28/2021 2052 by Robley Fries, RN ?Outcome: Progressing ?  ?Problem: Coping: ?Goal: Level of anxiety will decrease ?08/28/2021 2052 by Robley Fries, RN ?Outcome: Not Progressing ?08/28/2021 2052 by Robley Fries, RN ?Outcome: Progressing ?  ?Problem: Elimination: ?Goal: Will not experience complications related to bowel motility ?08/28/2021 2052 by Robley Fries, RN ?Outcome: Not Progressing ?08/28/2021 2052 by Robley Fries, RN ?Outcome: Progressing ?Goal: Will not experience complications related to urinary retention ?08/28/2021 2052 by Robley Fries, RN ?Outcome: Not Progressing ?08/28/2021 2052 by Robley Fries, RN ?Outcome: Progressing ?  ?Problem: Pain Managment: ?Goal: General experience of comfort will improve ?08/28/2021 2052 by Robley Fries, RN ?Outcome: Not Progressing ?08/28/2021 2052 by Robley Fries, RN ?Outcome: Progressing ?  ?Problem: Safety: ?Goal: Ability to remain free from injury will  improve ?08/28/2021 2052 by Robley Fries, RN ?Outcome: Not Progressing ?08/28/2021 2052 by Robley Fries, RN ?Outcome: Progressing ?  ?Problem: Skin Integrity: ?Goal: Risk for impaired skin integrity will decrease ?08/28/2021 2052 by Robley Fries, RN ?Outcome: Not Progressing ?08/28/2021 2052 by Robley Fries, RN ?Outcome: Progressing ?  ?

## 2021-08-28 NOTE — Progress Notes (Signed)
SLP Cancellation Note ? ?Patient Details ?Name: KISSY CIELO ?MRN: [id number] ?DOB: 1927/03/12 ? ? ?Cancelled treatment:       Reason Eval/Treat Not Completed: Patient at procedure or test/unavailable ? ? ?Oris Staffieri, Katherene Ponto ?08/28/2021, 10:44 AM ?

## 2021-08-28 NOTE — Progress Notes (Signed)
Manufacturing engineer [facility]) Hospital Liaison note.   ? ?Received request from [place] for family interest in [facility]. Spoke with family to confirm interest and answer questions. [facility] is unable to offer a room today. Hospital Liaison will follow up tomorrow or sooner if a room becomes available and eligibility is confirmed.  ? ?Please do not hesitate to call with questions.   ? ?Thank you,   ?Farrel Gordon, RN, CCM      ?[facility] Hospital Liaison   ?[phone] ?

## 2021-08-28 NOTE — TOC Progression Note (Addendum)
Transition of Care (TOC) - Progression Note  ? ? ?Patient Details  ?Name: Marissa Bowen ?MRN: [id number] ?Date of Birth: [birth year] ? ?Transition of Care (TOC) CM/SW Contact  ?[name], LCSWA ?Phone Number: ?08/28/2021, 10:09 AM ? ?Clinical Narrative:    ? ?Update-CSW received consult to make referral to [place] for [facility]. CSW called patients daughter [name]. [name] gave CSW permission to make referral to [place] for [facility] place for patient. CSW called [facility] and spoke with [facility] and made referral. Latanya Presser confirmed she will follow up with CSW on status of referral once determined. CSW informed Janie with [facility]. CSW will continue to follow. ? ?Patient has SNF bed at [facility] when medically ready for dc. CSW will continue to follow and assist with patients dc planning needs. ? ?  ?Barriers to Discharge: Continued Medical Work up ? ?Expected Discharge Plan and Services ?  ?In-house Referral: Clinical Social Work ?  ?  ?Living arrangements for the past 2 months: [facility]) ?                ?  ?  ?  ?  ?  ?  ?  ?  ?  ?  ? ? ?Social Determinants of Health (SDOH) Interventions ?  ? ?Readmission Risk Interventions ?   ? View : No data to display.  ?  ?  ?  ? ? ?

## 2021-08-28 NOTE — Progress Notes (Signed)
Physical Therapy Treatment ?Patient Details ?Name: Marissa Bowen ?MRN: [id number] ?DOB: 03/25/1927 ?Today's Date: 08/28/2021 ? ? ?History of Present Illness 88 y.o. female admitted 4/27 with fall, AMS, weakness and edema. Pt with acute on chronic CHf and Afib. PMhx: CAD s/p CABG, CVA, dementia, paroxysmal Afib on Eliquis, GIB, diverticulitis, IBS, migraines, GERD, cholecystectomy, HTN, anxiety ? ?  ?PT Comments  ? ? Pt with decreased cognition, no verbalization and no command following throughout session. Pt max -total +2 for bed mobility and standing attempt. Pt with decreased posterior lean sitting EOB requiring min assist for sitting but unsafe to transfer OOB at this time given dependent state. Pt on RA at 93% sitting and desaturates to 89% with return to supine with pt returned to 2L end of session at 95%. Will continue to attempt. Daughter present and able to confirm pt was walking with RW with assist and talking at baseline.  ? ?Max HR 140 ?   ?Recommendations for follow up therapy are one component of a multi-disciplinary discharge planning process, led by the attending physician.  Recommendations may be updated based on patient status, additional functional criteria and insurance authorization. ? ?Follow Up Recommendations ? Skilled nursing-short term rehab (<3 hours/day) ?  ?  ?Assistance Recommended at Discharge Frequent or constant Supervision/Assistance  ?Patient can return home with the following A lot of help with bathing/dressing/bathroom;Two people to help with walking and/or transfers;Assistance with cooking/housework;Assist for transportation;Direct supervision/assist for medications management;Direct supervision/assist for financial management ?  ?Equipment Recommendations ? Wheelchair (measurements PT);Wheelchair cushion (measurements PT);Hospital bed  ?  ?Recommendations for Other Services   ? ? ?  ?Precautions / Restrictions Precautions ?Precautions: Other (comment) ?Precaution Comments: watch HR/  SPO2, incontinent  ?  ? ?Mobility ? Bed Mobility ?Overal bed mobility: Needs Assistance ?Bed Mobility: Supine to Sit, Sit to Supine ?  ?  ?Supine to sit: Total assist, +2 for physical assistance ?Sit to supine: Total assist, +2 for physical assistance ?  ?General bed mobility comments: total +2 to transition to sitting and back to supine with helicopter pivot with pad. No initiation or assist from pt ?  ? ?Transfers ?Overall transfer level: Needs assistance ?  ?Transfers: Sit to/from Stand ?Sit to Stand: Max assist, +2 physical assistance ?  ?  ?  ?  ?  ?General transfer comment: max +2 with pad to rise from surface with knees blocked and assist for anterior translation. Max assist for weight shift and to move feet to step toward HOB. ?  ? ?Ambulation/Gait ?  ?  ?  ?  ?  ?  ?  ?General Gait Details: currently unable ? ? ?Stairs ?  ?  ?  ?  ?  ? ? ?Wheelchair Mobility ?  ? ?Modified Rankin (Stroke Patients Only) ?  ? ? ?  ?Balance Overall balance assessment: Needs assistance ?Sitting-balance support: Bilateral upper extremity supported, Feet supported ?Sitting balance-Leahy Scale: Poor ?Sitting balance - Comments: initial max assist with progression to min assist sitting grossly 6 min ?  ?Standing balance support: Bilateral upper extremity supported ?Standing balance-Leahy Scale: Zero ?Standing balance comment: max +2 assist for standing ?  ?  ?  ?  ?  ?  ?  ?  ?  ?  ?  ?  ? ?  ?Cognition Arousal/Alertness: Awake/alert ?Behavior During Therapy: Flat affect ?Overall Cognitive Status: Impaired/Different from baseline ?  ?  ?  ?  ?  ?  ?  ?  ?  ?  ?  ?  ?  ?  ?  ?  ?  General Comments: pt flat without verbalization this session, blank stare with intermittent attention to name called, not following commands this session ?  ?  ? ?  ?Exercises   ? ?  ?General Comments   ?  ?  ? ?Pertinent Vitals/Pain Pain Assessment ?Faces Pain Scale: Hurts little more (grimace with touch to left hand/wrist) ?Breathing: normal ?Negative  Vocalization: none ?Facial Expression: facial grimacing ?Body Language: relaxed ?Consolability: no need to console ?PAINAD Score: 2  ? ? ?Home Living   ?  ?  ?  ?  ?  ?  ?  ?  ?  ?   ?  ?Prior Function    ?  ?  ?   ? ?PT Goals (current goals can now be found in the care plan section) Progress towards PT goals: Not progressing toward goals - comment ? ?  ?Frequency ? ? ? Min 2X/week ? ? ? ?  ?PT Plan Current plan remains appropriate  ? ? ?Co-evaluation PT/OT/SLP Co-Evaluation/Treatment: Yes ?Reason for Co-Treatment: Complexity of the patient's impairments (multi-system involvement);For patient/therapist safety ?PT goals addressed during session: Mobility/safety with mobility ?  ?  ? ?  ?AM-PAC PT "6 Clicks" Mobility   ?Outcome Measure ? Help needed turning from your back to your side while in a flat bed without using bedrails?: Total ?Help needed moving from lying on your back to sitting on the side of a flat bed without using bedrails?: Total ?Help needed moving to and from a bed to a chair (including a wheelchair)?: Total ?Help needed standing up from a chair using your arms (e.g., wheelchair or bedside chair)?: Total ?Help needed to walk in hospital room?: Total ?Help needed climbing 3-5 steps with a railing? : Total ?6 Click Score: 6 ? ?  ?End of Session Equipment Utilized During Treatment: Gait belt ?Activity Tolerance: Patient limited by fatigue ?Patient left: in bed;with call bell/phone within reach;with bed alarm set;with family/visitor present ?Nurse Communication: Mobility status ?PT Visit Diagnosis: Other abnormalities of gait and mobility (R26.89);Difficulty in walking, not elsewhere classified (R26.2);Muscle weakness (generalized) (M62.81);History of falling (Z91.81) ?  ? ? ?Time: 9628-3662 ?PT Time Calculation (min) (ACUTE ONLY): 23 min ? ?Charges:  $Therapeutic Activity: 8-22 mins          ?          ? ?Torrian Canion P, PT ?Acute Rehabilitation Services ?Pager: [phone] ?Office: [phone] ? ? ? ?Kasiya Burck B  Acxel Dingee ?08/28/2021, 10:51 AM ? ?

## 2021-08-28 NOTE — Progress Notes (Incomplete)
? ?  Subjective: ?overnight events: NAOE  ? ?Patient assessed at bedside this AM. She remains unable to answer questions appropriately, and she is still unable to follow commands. She does appear slightly improved from previous days.  ? ?Objective: ? ?Vital signs in last 24 hours: ?Vitals:  ? 08/28/21 0816 08/28/21 1045 08/28/21 1142 08/28/21 1143  ?BP: (!) 156/103  (!) 147/97 (!) 147/97  ?Pulse: 99  95 80  ?Resp: '20  18 17  '$ ?Temp: 98.2 ?F (36.8 ?C)  97.8 ?F (36.6 ?C)   ?TempSrc: Oral  Oral   ?SpO2: 97% (!) 89% 96% 99%  ?Weight:      ?Height:      ? ?Constitutional: elderly female, awake and lying in bed, opens eyes to voice and tracks speaker, in no acute distress ?HENT: normocephalic, mucous membranes slightly dry ?Cardiovascular: tachycardic, irregularly irregular, no m/r/g. No pitting edema to lower extremities bilaterally. ?Pulmonary/Chest: normal work of breathing on room air, lungs clear to auscultation bilaterally ?Abdominal: soft, non-tender, non-distended ?MSK: No erythema or TTP along left arm. She does have generalized TTP of the left wrist with some overlying edema.  ?Neurological: A&O to person, but unable to tell me age, location, or situation. She is does not appropriately follows commands or respond.  ?Skin: warm and dry ? ?Assessment/Plan: ? ?Principal Problem: ?  End of life care ?Active Problems: ?  Encephalopathy ?  Atrial fibrillation ([place]) ?  Dementia with behavioral disturbance ([place]) ?  Acute exacerbation of congestive heart failure ([place]) ? ?MALAAK STACH is a 88 y.o. with a pertinent PMH of vascular dementia and heart failure with preserved ejection fraction, permanent atrial fibrillation admitted for acute on chronic heart failure exacerbation complicated by acute encephalopathy on HD#5.  ? ?Eating and drinking-  ?HR-  ? ?Comfort care  ?End stage dementia  ?Stable/down-trending mentation since admission-- family believe she has actually been getting worse. She is not eating or drinking  well. Workup for reversible causes negative till date. Suspect this is likely end stage dementia, from progression of her known dx of vascular dementia. Pt's daughter and son believe that this is not consistent with the QOL pt would want, and transitioned to comfort care measures yesterday, with plans for discharge to inpatient hospice pending placement.  ? ?Tachycardia  ?Permanent atrial fibrillation with RVR ?HTN ?HR's *** ?Discontinued metoprolol, amiodarone, and enoxaparin, as family wish to proceed with comfort care measures at this time.   ?  ?Chronic diastolic heart failure, currently volume down   ?Patient appears slightly dry on exam with dry mucus membranes. Lungs are clear to auscultation with stable oxygen saturations on room air. On comfort care measures at this time. Discontinued medications, labs, I/O's, and weights.  ? ? ?Diet: Dysphagia 1 diet ?IVF: none  ?VTE: none   ?Code: DNR ? ?Lajean Manes, MD  ?Internal Medicine Resident, PGY-1 ?Pager: [phone] ?After 5pm on weekdays and 1pm on weekends: On Call pager [phone] ?

## 2021-08-28 NOTE — Consult Note (Signed)
?Palliative Medicine Inpatient Consult Note ? ?Consulting Provider: Lajean Manes, MD ? ?Reason for consult:   ?[facility] [facility] Palliative Medicine Consult  ?Reason for Consult? [place] discussion  ? ?HPI:  ?Per intake H&P --> Marissa Bowen is a 88 y.o. with a pertinent PMH of vascular dementia and heart failure with preserved ejection fraction, permanent atrial fibrillation admitted for acute on chronic heart failure exacerbation complicated by acute encephalopathy. Palliative care has been asked to get involved in the setting of multiple chronic co-morbidities to further address goals of care. ? ?Clinical Assessment/Goals of Care: ? ?*Please note that this is a verbal dictation therefore any spelling or grammatical errors are due to the "[place] [facility]" system interpretation. ? ?I have reviewed medical records including EPIC notes, labs and imaging, received report from bedside RN, assessed the patient.  ?  ?Dr. Posey Pronto and I met with patients son, Marissa Bowen and daughter, Marissa Bowen  to further discuss diagnosis prognosis, [place], EOL wishes, disposition and options. ?  ?I introduced Palliative Medicine as specialized medical care for people living with serious illness. It focuses on providing relief from the symptoms and stress of a serious illness. The goal is to improve quality of life for both the patient and the family. ? ?Medical History Review and Understanding: ? ?We discussed that Marissa Bowen has a past medical history of vascula dementia, Afib on AC, and heart failure.  ? ?Reviewed that Marissa Bowen has been in house since 4/27 and not made tremendous improvements.  ? ?Social History: ? ?Marissa Bowen Lives in [place], [place]. She is a widow. She had three children one of whom passed in January. 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 She is a very  independent spirits who had a real zest for life. She is a woman of the Protestant Faith.  ? ?Functional and Nutritional State: ? ?About a week ago Marissa Bowen was fully functional living in her ALF. She was able to get to the dining hall with assistance. She was able to go to the dining hall with friends and had a fairly good appetite.  ? ?Palliative Symptoms: ? ?In the past has had complicated deliriums in new environments. Reviewed the differences between hyperactive and hypoactive delirium. ? ?Advance Directives: ? ?Patients two children make decisions jointly.  ? ?Code Status: ? ?Marissa Bowen is an established DNAR/DNI.  ? ?Discussion: ? ?We reviewed the significance of Marissa Bowen's vascular dementia. Discussed that often an acute hospitalization or infection can cause a rapid decline in dementia patients. Discussed the concerns around patients ongoing delirium and failure to thrive. We reviewed that Marissa Bowen is not terribly likely to reach her prior baseline. Per Marissa Bowen if she would be unable to remain independent as she was prior she would no longer desire to be on this earth. Her children share that she has been terribly depressed since her daughter died. Since admission Marissa Bowen has made some comments such as, " I'm so homesick" in reference to her aunt Marissa Bowen who is deceased. ? ?We discussed the idea of patient going to SNF versus being made comfortable with transition to a hospice home. Patients children have elected to pursue comfort care at this time. We talked about transition to comfort measures in house and what that would entail inclusive of medications to control pain, dyspnea, agitation, nausea,  itching, and hiccups.  We discussed stopping all uneccessary measures such as cardiac monitoring, blood draws, needle sticks, and frequent vital signs. Utilized reflective listening throughout our time together.  ? ?From the perspective of spiritual involvement patient has a Marissa Bowen who has been visiting from [place].  She does enjoy music therefore we talked about low volume music being played in the room.  ? ?Discussed the importance of continued conversation with family and their  medical providers regarding overall plan of care and treatment options, ensuring decisions are within the context of the patients values and GOCs. ? ?Decision Maker: ?Marissa Bowen Daughter     [phone]    ? ?Marissa Bowen Son     [phone]    ? ?SUMMARY OF RECOMMENDATIONS   ?DNAR/DNI ? ?Comfort Care ? ?Will add low dose ativan ATC to support any possible agitation episodes ? ?Unrestricted visitation ? ?Delirium Precautions ? ?Ongoing Palliative Support ? ?TOC - Inquiry to [employer] ? ?Prognosis < 2 weeks as of present ? ?Code Status/Advance Care Planning: ?DNAR/DNI ? ?Palliative Prophylaxis:  ?Aspiration, Bowel Regimen, Delirium Protocol, Frequent Pain Assessment, Oral Care, Palliative Wound Care, and Turn Reposition ? ?Additional Recommendations (Limitations, Scope, Preferences): ?Avoid Hospitalization, Full Scope Treatment, No Artificial Feeding, No Surgical Procedures, and No Tracheostomy ? ?Psycho-social/Spiritual:  ?Desire for further Chaplaincy support: Patient has a Marissa Bowen who has been visiting from [place] ?Additional Recommendations: Education on progressive nature of dementia ?  ?Prognosis: < 2 weeks ? ?Discharge Planning: Discharge to inpatient hospice. ? ?Vitals:  ? 08/28/21 0000 08/28/21 0400  ?BP: 130/70 (!) 147/88  ?Pulse: 99 98  ?Resp:  20  ?Temp: 98.6 ?F (37 ?C) 98.7 ?F (37.1 ?C)  ?SpO2: 96% 96%  ? ? ?Intake/Output Summary (Last 24 hours) at 08/28/2021 0707 ?Last data filed at 08/28/2021 0600 ?Gross per 24 hour  ?Intake 232.5 ml  ?Output 1000 ml  ?Net -767.5 ml  ? ?Last Weight  Most recent update: 08/28/2021  6:15 AM  ? ? Weight  ?70.1 kg (154 lb 8.7 oz)  ?      ? ?  ? ?Gen:  Elderly Frail Caucasian F in NAD ?HEENT: moist mucous membranes ?CV: Irregular rate and rhythm ?PULM: ON RA ?ABD: soft/nontender ?EXT: No edema ?Neuro:  Somnolent ? ?PPS: 10% ? ? ?This conversation/these recommendations were discussed with patient primary care team, Dr. Posey Pronto ? ? ?[facility] ?______________________________________________________ ?Tacey Ruiz ?[place] Team ?Team Cell Phone: [phone] ?Please utilize secure chat with additional questions, if there is no response within 30 minutes please call the above phone number ? ?Palliative Medicine Team providers are available by phone from 7am to 7pm daily and can be reached through the team cell phone.  ?Should this patient require assistance outside of these hours, please call the patient's attending physician. ? ? ?

## 2021-08-28 NOTE — Progress Notes (Signed)
Occupational Therapy Treatment ?Patient Details ?Name: Marissa Bowen ?MRN: [id number] ?DOB: 01-Dec-1926 ?Today's Date: 08/28/2021 ? ? ?History of present illness 88 y.o. female admitted 4/27 with fall, AMS, weakness and edema. Pt with acute on chronic CHf and Afib. PMhx: CAD s/p CABG, CVA, dementia, paroxysmal Afib on Eliquis, GIB, diverticulitis, IBS, migraines, GERD, cholecystectomy, HTN, anxiety ?  ?OT comments ? Patient with decreased following commands and no verbalization.  Patient was total assist +2 to get to EOB and max assist +2 to stand from EOB using bed pads to assist. Gown change performed at bed level and grooming tasks attempted with minimum participation from patient.  Patient will continue to be followed by Acute OT.   ? ?Recommendations for follow up therapy are one component of a multi-disciplinary discharge planning process, led by the attending physician.  Recommendations may be updated based on patient status, additional functional criteria and insurance authorization. ?   ?Follow Up Recommendations ? Skilled nursing-short term rehab (<3 hours/day)  ?  ?Assistance Recommended at Discharge Frequent or constant Supervision/Assistance  ?Patient can return home with the following ? Two people to help with walking and/or transfers;Two people to help with bathing/dressing/bathroom;Assistance with cooking/housework;Assistance with feeding;Direct supervision/assist for medications management;Direct supervision/assist for financial management;Assist for transportation;Help with stairs or ramp for entrance ?  ?Equipment Recommendations ? Other (comment) (TBD)  ?  ?Recommendations for Other Services   ? ?  ?Precautions / Restrictions Precautions ?Precautions: Other (comment) ?Precaution Comments: watch HR/ SPO2, incontinent ?Restrictions ?Weight Bearing Restrictions: No  ? ? ?  ? ?Mobility Bed Mobility ?Overal bed mobility: Needs Assistance ?Bed Mobility: Supine to Sit, Sit to Supine ?  ?  ?Supine to sit:  Total assist, +2 for physical assistance ?Sit to supine: Total assist, +2 for physical assistance ?  ?General bed mobility comments: total +2 to transition to sitting and back to supine with helicopter pivot with pad. No initiation or assist from pt ?  ? ?Transfers ?Overall transfer level: Needs assistance ?  ?Transfers: Sit to/from Stand ?Sit to Stand: Max assist, +2 physical assistance ?  ?  ?  ?  ?  ?General transfer comment: max +2 with pad to rise from surface with knees blocked and assist for anterior translation. Max assist for weight shift and to move feet to step toward HOB. ?  ?  ?Balance Overall balance assessment: Needs assistance ?Sitting-balance support: Bilateral upper extremity supported, Feet supported ?Sitting balance-Leahy Scale: Poor ?Sitting balance - Comments: initial max assist with progression to min assist sitting grossly 6 min ?  ?Standing balance support: Bilateral upper extremity supported ?Standing balance-Leahy Scale: Zero ?Standing balance comment: max +2 assist for standing ?  ?  ?  ?  ?  ?  ?  ?  ?  ?  ?  ?   ? ?ADL either performed or assessed with clinical judgement  ? ?ADL Overall ADL's : Needs assistance/impaired ?  ?  ?Grooming: Dance movement psychotherapist;Bed level;Moderate assistance ?Grooming Details (indicate cue type and reason): patient required assistance to hold washcloth and to assist with cleaning ?  ?  ?  ?  ?Upper Body Dressing : Maximal assistance;Bed level ?Upper Body Dressing Details (indicate cue type and reason): changed gowns ?  ?  ?  ?  ?  ?  ?  ?  ?  ?General ADL Comments: not following commands with self care tasks ?  ? ?Extremity/Trunk Assessment Upper Extremity Assessment ?Upper Extremity Assessment: RUE deficits/detail ?RUE Deficits / Details: pain at right  wrist/hand ?  ?  ?  ?  ?  ? ?Vision   ?  ?  ?Perception   ?  ?Praxis   ?  ? ?Cognition Arousal/Alertness: Awake/alert ?Behavior During Therapy: Flat affect ?Overall Cognitive Status: Impaired/Different from  baseline ?  ?  ?  ?  ?  ?  ?  ?  ?  ?  ?  ?  ?  ?  ?  ?  ?General Comments: not following commands with flat affect and no verbilization ?  ?  ?   ?Exercises   ? ?  ?Shoulder Instructions   ? ? ?  ?General Comments    ? ? ?Pertinent Vitals/ Pain       Pain Assessment ?Pain Assessment: Faces ?Faces Pain Scale: Hurts little more ?Pain Location: left hand or wrist ?Pain Descriptors / Indicators: Grimacing ?Pain Intervention(s): Monitored during session, Limited activity within patient's tolerance ? ?Home Living   ?  ?  ?  ?  ?  ?  ?  ?  ?  ?  ?  ?  ?  ?  ?  ?  ?  ?  ? ?  ?Prior Functioning/Environment    ?  ?  ?  ?   ? ?Frequency ? Min 2X/week  ? ? ? ? ?  ?Progress Toward Goals ? ?OT Goals(current goals can now be found in the care plan section) ? Progress towards OT goals: Progressing toward goals ? ?Acute Rehab OT Goals ?OT Goal Formulation: Patient unable to participate in goal setting ?Time For Goal Achievement: 09/07/21 ?Potential to Achieve Goals: Good ?ADL Goals ?Pt Will Perform Grooming: with min assist;sitting ?Pt Will Transfer to Toilet: with mod assist;stand pivot transfer;bedside commode ?Additional ADL Goal #1: Pt will follow 1 step commands with 90% accuracy. ?Additional ADL Goal #2: Pt will complete bed mobility with no more than min assist as precursor to ADLs.  ?Plan Discharge plan remains appropriate   ? ?Co-evaluation ? ? ? PT/OT/SLP Co-Evaluation/Treatment: Yes ?Reason for Co-Treatment: For patient/therapist safety;To address functional/ADL transfers ?PT goals addressed during session: Mobility/safety with mobility ?OT goals addressed during session: ADL's and self-care ?  ? ?  ?AM-PAC OT "6 Clicks" Daily Activity     ?Outcome Measure ? ? Help from another person eating meals?: A Lot ?Help from another person taking care of personal grooming?: A Lot ?Help from another person toileting, which includes using toliet, bedpan, or urinal?: Total ?Help from another person bathing (including washing,  rinsing, drying)?: A Lot ?Help from another person to put on and taking off regular upper body clothing?: A Lot ?Help from another person to put on and taking off regular lower body clothing?: Total ?6 Click Score: 10 ? ?  ?End of Session Equipment Utilized During Treatment: Gait belt ? ?OT Visit Diagnosis: Other abnormalities of gait and mobility (R26.89);Muscle weakness (generalized) (M62.81);Other symptoms and signs involving cognitive function;History of falling (Z91.81) ?  ?Activity Tolerance Patient tolerated treatment well ?  ?Patient Left in bed;with call bell/phone within reach;with bed alarm set;with family/visitor present ?  ?Nurse Communication Mobility status ?  ? ?   ? ?Time: 1024-1050 ?OT Time Calculation (min): 26 min ? ?Charges: OT General Charges ?$OT Visit: 1 Visit ?OT Treatments ?$Self Care/Home Management : 8-22 mins ? ?Lodema Hong, OTA ?Acute Rehabilitation Services  ?Pager [phone] ?Office [phone] ? ? ?[place] ?08/28/2021, 11:51 AM ?

## 2021-08-29 DIAGNOSIS — Z515 Encounter for palliative care: Secondary | ICD-10-CM | POA: Diagnosis not present

## 2021-08-29 NOTE — TOC Progression Note (Signed)
Transition of Care (TOC) - Progression Note  ? ? ?Patient Details  ?Name: Marissa Bowen ?MRN: [id number] ?Date of Birth: 04-15-1927 ? ?Transition of Care (TOC) CM/SW Contact  ?[name], LCSWA ?Phone Number: ?08/29/2021, 10:06 AM ? ?Clinical Narrative:    ? ?CSW spoke with Audrea Muscat with [facility] who confirmed they have bed today at [facility] for patient. CSW informed MD. CSW will continue to follow and assist with patients dc planning needs. ? ?  ?Barriers to Discharge: Continued Medical Work up ? ?Expected Discharge Plan and Services ?  ?In-house Referral: Clinical Social Work ?  ?  ?Living arrangements for the past 2 months: [facility]) ?                ?  ?  ?  ?  ?  ?  ?  ?  ?  ?  ? ? ?Social Determinants of Health (SDOH) Interventions ?  ? ?Readmission Risk Interventions ? ?  08/28/2021  ?  1:11 PM  ?Readmission Risk Prevention Plan  ?Transportation Screening Complete  ?[place] or Home Care Consult Complete  ?Social Work Consult for [place] Planning/Counseling Complete  ?Palliative Care Screening Complete  ? ? ?

## 2021-08-29 NOTE — Progress Notes (Addendum)
Patient Marissa Bowen      DOB: [birth year]      FGH:[id number] ? ? ? ?  ?Palliative Medicine Team ? ? ? ?Subjective: Bedside symptom check completed. Daughter bedside at time of visit. ? ? ?Physical exam: Patient resting in bed with eyes closed at time of visit. Breathing even and non-labored with nasal cannula applied, no excessive secretions noted. Patient without physical or non-verbal signs of pain or discomfort at this time.  ? ? ?Assessment and plan: Plan to go to residential hospice facility today. Daughter plans to leave shortly to go sign paperwork with [facility], she did not have any concerns or needs this morning. Bedside RN Lennette Bihari without any concerns or needs at this time. Patient stable for transfer at this time. Will continue to follow for any changes or advances.  ? ? ?Thank you for allowing the Palliative Medicine Team to assist in the care of this patient. ?  ?  ?Damian Leavell, MSN, RN ?Palliative Medicine Team ?Team Phone: [phone]  ?This phone is monitored 7a-7p, please reach out to attending physician outside of these hours for urgent needs.   ?

## 2021-08-29 NOTE — TOC Transition Note (Addendum)
Transition of Care (TOC) - CM/SW Discharge Note ? ? ?Patient Details  ?Name: Marissa Bowen ?MRN: [id number] ?Date of Birth: [birth year] ? ?Transition of Care (TOC) CM/SW Contact:  ?[name], LCSWA ?Phone Number: ?08/29/2021, 11:26 AM ? ? ?Clinical Narrative:    ? ?Patient will DC to: [place] ? ?Anticipated DC date: 08/29/2021 ? ?Family notified: [name]  ? ?Transport by: Corey Harold ? ?? ? ?Per MD patient ready for DC to [facility] . RN, patient, patient's family, Bevely Palmer with [facility],notified of DC. RN given number for report [phone].  DC packet on chart. DNR signed by MD attached to patients DC packet.Ambulance transport requested for patient. ? ?CSW signing off.  ? ?Final next level of care: [facility]) ?Barriers to Discharge: No Barriers Identified ? ? ?Patient Goals and CMS Choice ?  ?CMS Medicare.gov Compare Post Acute Care list provided to:: Patient Represenative (must comment) (Patients daughter [name]) ?Choice offered to / list presented to : Adult Children (Patients daughter [name]) ? ?Discharge Placement ?  ?           ?Patient chooses bed at:  [facility]) ?Patient to be transferred to facility by: [facility] ?Name of family member notified: [name] ?Patient and family notified of of transfer: 08/29/21 ? ?Discharge Plan and Services ?In-house Referral: Clinical Social Work ?  ?           ?  ?  ?  ?  ?  ?  ?  ?  ?  ?  ? ?Social Determinants of Health (SDOH) Interventions ?  ? ? ?Readmission Risk Interventions ? ?  08/28/2021  ?  1:11 PM  ?Readmission Risk Prevention Plan  ?Transportation Screening Complete  ?[place] or Home Care Consult Complete  ?Social Work Consult for [place] Planning/Counseling Complete  ?Palliative Care Screening Complete  ? ? ? ? ? ?

## 2021-08-29 NOTE — Progress Notes (Signed)
[facility] ([facility]) Hospital Liaison note.     This patient is approved to transfer to [facility] today.    [facility] will notify TOC when registration paperwork has been completed to arrange transport.    RN please call report to [phone].   Thank you,     Mary Anne Robertson, RN, CCM       [facility] Hospital Liaison  [phone] 

## 2021-08-29 NOTE — Discharge Summary (Signed)
? ?Name: Marissa Bowen ?MRN: [id number] ?DOB: 04/21/1927 88 y.o. ?PCP: System, Provider Not In ? ?Date of Admission: 08/23/2021  1:45 PM ?Date of Discharge: 08/29/2021  ?Attending Physician: Dr. Daryll Drown ? ?DISCHARGE DIAGNOSIS:  ?Primary Problem: End of life care  ? ?Hospital Problems: ?Principal Problem: ?  End of life care ?Active Problems: ?  Encephalopathy ?  Atrial fibrillation ([place]) ?  Dementia with behavioral disturbance ([place]) ?  Acute exacerbation of congestive heart failure ([place]) ?  ? ?DISCHARGE MEDICATIONS:  ? ?Allergies as of 08/29/2021   ? ?   Reactions  ? Asa [aspirin] Other (See Comments)  ? Bleeding ulcer ?Per MAR  ? Crestor [rosuvastatin Calcium] Other (See Comments)  ? Myalgia ?Per MAR  ? Hydrocodone Other (See Comments)  ? Migraines ?Per MAR  ? Ibuprofen Other (See Comments)  ? Ulcers ?Per MAR  ? Oxycodone Hcl Other (See Comments)  ?  Migraines ?Per MAR  ? Penicillins Swelling, Rash  ? Per MAR ?Has patient had a PCN reaction causing immediate rash, facial/tongue/throat swelling, SOB or lightheadedness with hypotension: Yes ?Has patient had a PCN reaction causing severe rash involving mucus membranes or skin necrosis: No ?Has patient had a PCN reaction that required hospitalization No ?Has patient had a PCN reaction occurring within the last 10 years: No ?If all of the above answers are "NO", then may proceed with Cephalosporin use.  ? Prednisone Other (See Comments)  ? Ulcers ?Per MAR  ? Statins Other (See Comments)  ? Aches and pains ?Per MAR  ? Morphine And Related Other (See Comments)  ?  Had hallucinations with morphine sulfate in the past   ?Per MAR  ? Pristiq [desvenlafaxine] Other (See Comments)  ? Drowsiness and hangover sensation ?Per MAR  ? ?  ? ?  ?Medication List  ?  ? ?STOP taking these medications   ? ?acetaminophen 325 MG tablet ?Commonly known as: TYLENOL ?  ?amiodarone 200 MG tablet ?Commonly known as: PACERONE ?  ?bumetanide 1 MG tablet ?Commonly known as: BUMEX ?  ?divalproex 125 MG DR  tablet ?Commonly known as: DEPAKOTE ?  ?divalproex 250 MG DR tablet ?Commonly known as: DEPAKOTE ?  ?donepezil 5 MG tablet ?Commonly known as: ARICEPT ?  ?DULoxetine 60 MG capsule ?Commonly known as: CYMBALTA ?  ?Eliquis 5 MG Tabs tablet ?Generic drug: apixaban ?  ?ezetimibe 10 MG tablet ?Commonly known as: ZETIA ?  ?meclizine 25 MG tablet ?Commonly known as: ANTIVERT ?  ?melatonin 5 MG Tabs ?  ?methenamine 1 g tablet ?Commonly known as: HIPREX ?  ?metoprolol tartrate 50 MG tablet ?Commonly known as: LOPRESSOR ?  ?mirabegron ER 25 MG Tb24 tablet ?Commonly known as: MYRBETRIQ ?  ?multivitamin capsule ?  ?nitrofurantoin 50 MG capsule ?Commonly known as: MACRODANTIN ?  ?nitroGLYCERIN 0.4 MG SL tablet ?Commonly known as: NITROSTAT ?  ?pantoprazole 40 MG tablet ?Commonly known as: PROTONIX ?  ?PreserVision AREDS Caps ?  ?tolterodine 2 MG tablet ?Commonly known as: DETROL ?  ?vitamin C 250 MG tablet ?Commonly known as: ASCORBIC ACID ?  ? ?  ? ?  ?  ? ? ?  ?Discharge Care Instructions  ?(From admission, onward)  ?  ? ? ?  ? ?  Start     Ordered  ? 08/29/21 0000  Discharge wound care:       ?Comments: Dressing changes as needed  ? 08/29/21 1101  ? ?  ?  ? ?  ? ? ?DISPOSITION AND FOLLOW-UP:  ?Marissa Brunt  Bowen was discharged from [facility] in a stable condition to [facility] for [place].  ? ?Follow-up Recommendations: ?Consults: none  ?Labs: none  ?Studies: none  ?Medications: none ? ? ?HOSPITAL COURSE:  ?Patient Summary: ?88 year old person living with vascular dementia at an assisted living facility admitted to our service with acute on chronic heart failure with preserved ejection fraction, she has permanent atrial fibrillation but now with mild RVR, and has had a declining functional status over the last 2 weeks.   ? ?Acute on chronic heart failure with preserved ejection fraction ?The patient has a history of CHF with most recent echo in 2018 showing EF 60-65% and G1DD, who presents  here today with complaints including DOE and worsening LE swelling.  Daughter also states that patient has not followed up with cardiology in several months, and the patient has been on Bumex in the past but has not been on this medication for some time.  Review of records also shows that she has been on amiodarone for her afib in the past but daughter is not familiar with this medication when asked about it today.  On arrival, patient is hypertensive with a wide range of systolics ranging in the 063K-160F, but she is otherwise stable and satting well on room air.  She does appear volume overloaded on exam today.  BNP of 447 which is twice the value of her last one obtained about 4 months ago.  Trop x2 flat. Her findings are consistent with a CHF exacerbation (there has been an extended period of time between now and her last follow-up. Her A-fib with RVR also could be a contributing factor).  ?She responded well to diuresis with return to euvolemic state. Echo showed EF 60 to 65% with normal function of left ventricle, no regional wall motion abnormalities.  Right ventricle is mildly reduced. ? ?Vascular dementia ?Acute encephalopathy of unknown origin ?Patient presented with 2-week history of declining interaction, decreased p.o. intake, and functional decline.  Initial work-up was negative for infective cause with UA being negative for leukocytes and nitrites.  On hospital day 2 her mentation worsened and she was not consistently answering questions.  Concern for acute neurological deficit as patient would not follow commands and was not moving left extremity. CT and MRI was negative for acute stroke.  Workup for reversible causes negative and not on centrally acting medications that could explain decline. Suspect this is likely end stage dementia, from progression of her known dx of vascular dementia. [place] discussion with palliative care team arranged to discuss prognosis and pt's wishes. Pt's daughter and son  state that this is not consistent with the QOL she would want, and they wish to proceed with comfort care measures and transfer to [facility].   ? ?DISCHARGE INSTRUCTIONS:  ? ?Discharge Instructions   ? ? Discharge wound care:   Complete by: As directed ?  ? Dressing changes as needed  ? ?  ? ? ?SUBJECTIVE:  ?No acute overnight events. Patient was seen at bedside during rounds this morning. Patient assessed at bedside this AM. She remains unable to answer questions appropriately, and is unable to follow commands. She does appear slightly improved from previous days.  ?  ?She has a hosp[facility] bed available at [facility] and family is aware.  ?  ?All questions were addressed with patient prior to being discharged.  ? ?Discharge Vitals:   ?BP (!) 161/94 (BP Location: Left Arm)   Pulse (!) 110  Temp 97.9 ?F (36.6 ?C) (Axillary)   Resp (!) 21   Ht 5' 7.5" (1.715 m)   Wt 70.1 kg   SpO2 100%   BMI 23.85 kg/m?  ? ?OBJECTIVE:  ?Constitutional: elderly female, sleeping in bed, opens eyes to voice and tracks speaker, in no acute distress ?HENT: normocephalic, mucous membranes slightly dry ?Cardiovascular: No pitting edema of lower extremities bilaterally. ?Pulmonary/Chest: normal work of breathing on room air, no agonal breathing present ?Neurological: A&O to person, but unable to tell me age, location, or situation. She is does not appropriately follows commands or respond.  ?Skin: warm and dry ? ?Pertinent Labs, Studies, and Procedures:  ? ?  Latest Ref Rng & Units 08/28/2021  ?  5:49 AM 08/27/2021  ?  4:11 AM 08/26/2021  ?  3:56 AM  ?CBC  ?WBC 4.0 - 10.5 K/uL 10.6   11.7   10.9    ?Hemoglobin 12.0 - 15.0 g/dL 14.0   13.3   13.9    ?Hematocrit 36.0 - 46.0 % 42.1   40.1   41.8    ?Platelets 150 - 400 K/uL 219   235   247    ? ? ? ?  Latest Ref Rng & Units 08/28/2021  ?  5:49 AM 08/27/2021  ?  4:11 AM 08/26/2021  ?  7:48 PM  ?CMP  ?Glucose 70 - 99 mg/dL 112   130   100    ?BUN 8 - 23 mg/dL '11   15   17    '$ ?Creatinine 0.44 -  1.00 mg/dL 0.53   0.61   0.68    ?Sodium 135 - 145 mmol/L 133   129   131    ?Potassium 3.5 - 5.1 mmol/L 3.7   3.7   4.0    ?Chloride 98 - 111 mmol/L 95   94   94    ?CO2 22 - 32 mmol/L '27   24   25    '$ ?Calcium 8.9

## 2021-08-29 NOTE — Progress Notes (Deleted)
? ?Name: Marissa Bowen ?MRN: [id number] ?DOB: Dec 12, 1926 88 y.o. ?PCP: System, Provider Not In ? ?Date of Admission: 08/23/2021  1:45 PM ?Date of Discharge:  08/29/21 ?Attending Physician: Dr. Daryll Drown ? ?DISCHARGE DIAGNOSIS:  ?Primary Problem: End of life care  ? ?Hospital Problems: ?Principal Problem: ?  End of life care ?Active Problems: ?  Encephalopathy ?  Atrial fibrillation ([place]) ?  Dementia with behavioral disturbance ([place]) ?  Acute exacerbation of congestive heart failure ([place]) ?  ? ?DISCHARGE MEDICATIONS:  ? ?Allergies as of 08/29/2021   ? ?   Reactions  ? Asa [aspirin] Other (See Comments)  ? Bleeding ulcer ?Per MAR  ? Crestor [rosuvastatin Calcium] Other (See Comments)  ? Myalgia ?Per MAR  ? Hydrocodone Other (See Comments)  ? Migraines ?Per MAR  ? Ibuprofen Other (See Comments)  ? Ulcers ?Per MAR  ? Oxycodone Hcl Other (See Comments)  ?  Migraines ?Per MAR  ? Penicillins Swelling, Rash  ? Per MAR ?Has patient had a PCN reaction causing immediate rash, facial/tongue/throat swelling, SOB or lightheadedness with hypotension: Yes ?Has patient had a PCN reaction causing severe rash involving mucus membranes or skin necrosis: No ?Has patient had a PCN reaction that required hospitalization No ?Has patient had a PCN reaction occurring within the last 10 years: No ?If all of the above answers are "NO", then may proceed with Cephalosporin use.  ? Prednisone Other (See Comments)  ? Ulcers ?Per MAR  ? Statins Other (See Comments)  ? Aches and pains ?Per MAR  ? Morphine And Related Other (See Comments)  ?  Had hallucinations with morphine sulfate in the past   ?Per MAR  ? Pristiq [desvenlafaxine] Other (See Comments)  ? Drowsiness and hangover sensation ?Per MAR  ? ?  ? ?  ?Medication List  ?  ? ?STOP taking these medications   ? ?acetaminophen 325 MG tablet ?Commonly known as: TYLENOL ?  ?amiodarone 200 MG tablet ?Commonly known as: PACERONE ?  ?bumetanide 1 MG tablet ?Commonly known as: BUMEX ?  ?divalproex 125 MG DR  tablet ?Commonly known as: DEPAKOTE ?  ?divalproex 250 MG DR tablet ?Commonly known as: DEPAKOTE ?  ?donepezil 5 MG tablet ?Commonly known as: ARICEPT ?  ?DULoxetine 60 MG capsule ?Commonly known as: CYMBALTA ?  ?Eliquis 5 MG Tabs tablet ?Generic drug: apixaban ?  ?ezetimibe 10 MG tablet ?Commonly known as: ZETIA ?  ?meclizine 25 MG tablet ?Commonly known as: ANTIVERT ?  ?melatonin 5 MG Tabs ?  ?methenamine 1 g tablet ?Commonly known as: HIPREX ?  ?metoprolol tartrate 50 MG tablet ?Commonly known as: LOPRESSOR ?  ?mirabegron ER 25 MG Tb24 tablet ?Commonly known as: MYRBETRIQ ?  ?multivitamin capsule ?  ?nitrofurantoin 50 MG capsule ?Commonly known as: MACRODANTIN ?  ?nitroGLYCERIN 0.4 MG SL tablet ?Commonly known as: NITROSTAT ?  ?pantoprazole 40 MG tablet ?Commonly known as: PROTONIX ?  ?PreserVision AREDS Caps ?  ?tolterodine 2 MG tablet ?Commonly known as: DETROL ?  ?vitamin C 250 MG tablet ?Commonly known as: ASCORBIC ACID ?  ? ?  ? ?  ?  ? ? ?  ?Discharge Care Instructions  ?(From admission, onward)  ?  ? ? ?  ? ?  Start     Ordered  ? 08/29/21 0000  Discharge wound care:       ?Comments: Dressing changes as needed  ? 08/29/21 1101  ? ?  ?  ? ?  ? ? ?DISPOSITION AND FOLLOW-UP:  ?Ms.Marissa Brunt  Bowen was discharged from [facility] in stable condition. At the hospital follow up visit please address: ? ?Follow-up Recommendations: ?Consults: none  ?Labs: none  ?Studies: none  ?Medications: none ? ?Follow-up Appointments: ? ? ?HOSPITAL COURSE:  ?Patient Summary: ?88 year old person living with vascular dementia at an assisted living facility admitted to our service with acute on chronic heart failure with preserved ejection fraction, she has permanent atrial fibrillation but now with mild RVR, and has had a declining functional status over the last 2 weeks.   ? ?Acute on chronic heart failure with preserved ejection fraction ?The patient has a history of CHF with most recent echo in 2018 showing EF  60-65% and G1DD, who presents here today with complaints including DOE and worsening LE swelling.  Daughter also states that patient has not followed up with cardiology in several months, and the patient has been on Bumex in the past but has not been on this medication for some time.  Review of records also shows that she has been on amiodarone for her afib in the past but daughter is not familiar with this medication when asked about it today.  On arrival, patient is hypertensive with a wide range of systolics ranging in the 035W-656C, but she is otherwise stable and satting well on room air.  She does appear volume overloaded on exam today.  BNP of 447 which is twice the value of her last one obtained about 4 months ago.  Trop x2 flat. Her findings are consistent with a CHF exacerbation (there has been an extended period of time between now and her last follow-up. Her A-fib with RVR also could be a contributing factor).  ?She responded well to diuresis with return to euvolemic state. Echo showed EF 60 to 65% with normal function of left ventricle, no regional wall motion abnormalities.  Right ventricle is mildly reduced. ? ?Vascular dementia ?Acute encephalopathy of unknown origin ?Patient presented with 2-week history of declining interaction, decreased p.o. intake, and functional decline.  Initial work-up was negative for infective cause with UA being negative for leukocytes and nitrites.  On hospital day 2 her mentation worsened and she was not consistently answering questions.  Concern for acute neurological deficit as patient would not follow commands and was not moving left extremity. CT and MRI was negative for acute stroke.  Workup for reversible causes negative and not on centrally acting medications that could explain decline. Suspect this is likely end stage dementia, from progression of her known dx of vascular dementia. [place] discussion with palliative care team arranged to discuss prognosis and pt's  wishes. Pt's daughter and son state that this is not consistent with the QOL she would want, and they wish to proceed with comfort care measures and transfer to [facility].   ? ?DISCHARGE INSTRUCTIONS:  ? ?Discharge Instructions   ? ? Discharge wound care:   Complete by: As directed ?  ? Dressing changes as needed  ? ?  ? ? ?SUBJECTIVE:  ?No acute overnight events. Patient was seen at bedside during rounds this morning. Patient assessed at bedside this AM. She remains unable to answer questions appropriately, and is unable to follow commands. She does appear slightly improved from previous days.  ? ?She has a hosp[facility] bed available at [facility] and family is aware.  ? ?All questions were addressed with patient prior to being discharged.  ? ?Discharge Vitals:   ?BP (!) 161/94 (BP Location: Left Arm)   Pulse (!) 110  Temp 97.9 ?F (36.6 ?C) (Axillary)   Resp (!) 21   Ht 5' 7.5" (1.715 m)   Wt 70.1 kg   SpO2 100%   BMI 23.85 kg/m?  ? ?OBJECTIVE:  ?Constitutional: elderly female, sleeping in bed, opens eyes to voice and tracks speaker, in no acute distress ?HENT: normocephalic, mucous membranes slightly dry ?Cardiovascular: No pitting edema of lower extremities bilaterally. ?Pulmonary/Chest: normal work of breathing on room air, no agonal breathing present ?Neurological: A&O to person, but unable to tell me age, location, or situation. She is does not appropriately follows commands or respond.  ?Skin: warm and dry ? ?Pertinent Labs, Studies, and Procedures:  ? ?  Latest Ref Rng & Units 08/28/2021  ?  5:49 AM 08/27/2021  ?  4:11 AM 08/26/2021  ?  3:56 AM  ?CBC  ?WBC 4.0 - 10.5 K/uL 10.6   11.7   10.9    ?Hemoglobin 12.0 - 15.0 g/dL 14.0   13.3   13.9    ?Hematocrit 36.0 - 46.0 % 42.1   40.1   41.8    ?Platelets 150 - 400 K/uL 219   235   247    ? ? ? ?  Latest Ref Rng & Units 08/28/2021  ?  5:49 AM 08/27/2021  ?  4:11 AM 08/26/2021  ?  7:48 PM  ?CMP  ?Glucose 70 - 99 mg/dL 112   130   100    ?BUN 8 - 23 mg/dL '11   15    17    '$ ?Creatinine 0.44 - 1.00 mg/dL 0.53   0.61   0.68    ?Sodium 135 - 145 mmol/L 133   129   131    ?Potassium 3.5 - 5.1 mmol/L 3.7   3.7   4.0    ?Chloride 98 - 111 mmol/L 95   94   94    ?CO2 22 - 32 mmol/L 27

## 2021-09-27 DEATH — deceased
